# Patient Record
Sex: Female | Born: 1935
Health system: Southern US, Community
[De-identification: ages and names within clinical notes are randomized; demographics above are authoritative.]

## PROBLEM LIST (undated history)

## (undated) DIAGNOSIS — F329 Major depressive disorder, single episode, unspecified: Secondary | ICD-10-CM

## (undated) DIAGNOSIS — M4800 Spinal stenosis, site unspecified: Secondary | ICD-10-CM

## (undated) DIAGNOSIS — I10 Essential (primary) hypertension: Secondary | ICD-10-CM

## (undated) DIAGNOSIS — D126 Benign neoplasm of colon, unspecified: Secondary | ICD-10-CM

## (undated) DIAGNOSIS — E785 Hyperlipidemia, unspecified: Secondary | ICD-10-CM

## (undated) DIAGNOSIS — M1990 Unspecified osteoarthritis, unspecified site: Secondary | ICD-10-CM

## (undated) DIAGNOSIS — E039 Hypothyroidism, unspecified: Secondary | ICD-10-CM

## (undated) DIAGNOSIS — M549 Dorsalgia, unspecified: Secondary | ICD-10-CM

## (undated) DIAGNOSIS — I2510 Atherosclerotic heart disease of native coronary artery without angina pectoris: Secondary | ICD-10-CM

## (undated) DIAGNOSIS — R0989 Other specified symptoms and signs involving the circulatory and respiratory systems: Secondary | ICD-10-CM

## (undated) DIAGNOSIS — K589 Irritable bowel syndrome without diarrhea: Secondary | ICD-10-CM

## (undated) DIAGNOSIS — K760 Fatty (change of) liver, not elsewhere classified: Secondary | ICD-10-CM

## (undated) DIAGNOSIS — K219 Gastro-esophageal reflux disease without esophagitis: Secondary | ICD-10-CM

## (undated) DIAGNOSIS — F32A Depression, unspecified: Secondary | ICD-10-CM

## (undated) DIAGNOSIS — G43909 Migraine, unspecified, not intractable, without status migrainosus: Secondary | ICD-10-CM

## (undated) DIAGNOSIS — G8929 Other chronic pain: Secondary | ICD-10-CM

## (undated) DIAGNOSIS — J45909 Unspecified asthma, uncomplicated: Secondary | ICD-10-CM

## (undated) DIAGNOSIS — K5790 Diverticulosis of intestine, part unspecified, without perforation or abscess without bleeding: Secondary | ICD-10-CM

## (undated) DIAGNOSIS — C439 Malignant melanoma of skin, unspecified: Secondary | ICD-10-CM

## (undated) DIAGNOSIS — K648 Other hemorrhoids: Secondary | ICD-10-CM

## (undated) DIAGNOSIS — I499 Cardiac arrhythmia, unspecified: Secondary | ICD-10-CM

## (undated) HISTORY — PX: KNEE ARTHROSCOPY: SUR90

## (undated) HISTORY — PX: SHOULDER SURGERY: SHX246

## (undated) HISTORY — DX: Diverticulosis of intestine, part unspecified, without perforation or abscess without bleeding: K57.90

## (undated) HISTORY — DX: Malignant melanoma of skin, unspecified: C43.9

## (undated) HISTORY — DX: Other specified symptoms and signs involving the circulatory and respiratory systems: R09.89

## (undated) HISTORY — DX: Other hemorrhoids: K64.8

## (undated) HISTORY — DX: Unspecified osteoarthritis, unspecified site: M19.90

## (undated) HISTORY — DX: Hypothyroidism, unspecified: E03.9

## (undated) HISTORY — DX: Essential (primary) hypertension: I10

## (undated) HISTORY — DX: Atherosclerotic heart disease of native coronary artery without angina pectoris: I25.10

## (undated) HISTORY — DX: Other chronic pain: G89.29

## (undated) HISTORY — PX: ROTATOR CUFF REPAIR: SHX139

## (undated) HISTORY — DX: Dorsalgia, unspecified: M54.9

## (undated) HISTORY — DX: Unspecified asthma, uncomplicated: J45.909

## (undated) HISTORY — DX: Spinal stenosis, site unspecified: M48.00

## (undated) HISTORY — DX: Benign neoplasm of colon, unspecified: D12.6

## (undated) HISTORY — DX: Hyperlipidemia, unspecified: E78.5

## (undated) HISTORY — PX: TOE SURGERY: SHX1073

## (undated) HISTORY — DX: Fatty (change of) liver, not elsewhere classified: K76.0

## (undated) HISTORY — PX: BACK SURGERY: SHX140

## (undated) HISTORY — PX: BLEPHAROPLASTY: SUR158

## (undated) HISTORY — PX: CARPAL TUNNEL RELEASE: SHX101

---

## 2003-12-18 ENCOUNTER — Encounter: Admission: RE | Admit: 2003-12-18 | Discharge: 2003-12-18 | Payer: Self-pay | Admitting: Neurosurgery

## 2004-01-01 ENCOUNTER — Encounter: Admission: RE | Admit: 2004-01-01 | Discharge: 2004-01-01 | Payer: Self-pay | Admitting: Neurosurgery

## 2004-04-15 ENCOUNTER — Encounter: Admission: RE | Admit: 2004-04-15 | Discharge: 2004-04-15 | Payer: Self-pay | Admitting: Obstetrics and Gynecology

## 2004-07-05 ENCOUNTER — Encounter: Admission: RE | Admit: 2004-07-05 | Discharge: 2004-07-05 | Payer: Self-pay | Admitting: Internal Medicine

## 2004-07-21 ENCOUNTER — Encounter: Admission: RE | Admit: 2004-07-21 | Discharge: 2004-07-21 | Payer: Self-pay | Admitting: Internal Medicine

## 2005-01-20 ENCOUNTER — Encounter: Admission: RE | Admit: 2005-01-20 | Discharge: 2005-01-20 | Payer: Self-pay | Admitting: Neurosurgery

## 2005-01-24 ENCOUNTER — Encounter: Admission: RE | Admit: 2005-01-24 | Discharge: 2005-01-24 | Payer: Self-pay | Admitting: Neurosurgery

## 2005-05-18 ENCOUNTER — Encounter: Admission: RE | Admit: 2005-05-18 | Discharge: 2005-05-18 | Payer: Self-pay | Admitting: Obstetrics and Gynecology

## 2005-08-24 ENCOUNTER — Encounter: Admission: RE | Admit: 2005-08-24 | Discharge: 2005-08-24 | Payer: Self-pay | Admitting: Internal Medicine

## 2005-09-22 ENCOUNTER — Emergency Department (HOSPITAL_COMMUNITY): Admission: EM | Admit: 2005-09-22 | Discharge: 2005-09-23 | Payer: Self-pay | Admitting: Emergency Medicine

## 2005-10-03 ENCOUNTER — Encounter: Admission: RE | Admit: 2005-10-03 | Discharge: 2005-10-03 | Payer: Self-pay | Admitting: Gastroenterology

## 2006-02-27 ENCOUNTER — Ambulatory Visit: Payer: Self-pay | Admitting: Cardiology

## 2006-03-15 ENCOUNTER — Ambulatory Visit (HOSPITAL_COMMUNITY): Admission: RE | Admit: 2006-03-15 | Discharge: 2006-03-15 | Payer: Self-pay | Admitting: Cardiology

## 2006-03-15 ENCOUNTER — Ambulatory Visit: Payer: Self-pay | Admitting: Cardiovascular Disease

## 2006-05-07 ENCOUNTER — Ambulatory Visit (HOSPITAL_COMMUNITY): Admission: RE | Admit: 2006-05-07 | Discharge: 2006-05-07 | Payer: Self-pay | Admitting: Internal Medicine

## 2006-05-09 ENCOUNTER — Ambulatory Visit (HOSPITAL_COMMUNITY): Admission: RE | Admit: 2006-05-09 | Discharge: 2006-05-09 | Payer: Self-pay | Admitting: Internal Medicine

## 2006-05-21 ENCOUNTER — Encounter: Admission: RE | Admit: 2006-05-21 | Discharge: 2006-05-21 | Payer: Self-pay | Admitting: Internal Medicine

## 2006-06-05 ENCOUNTER — Encounter: Admission: RE | Admit: 2006-06-05 | Discharge: 2006-06-05 | Payer: Self-pay | Admitting: Neurosurgery

## 2006-07-24 ENCOUNTER — Encounter: Admission: RE | Admit: 2006-07-24 | Discharge: 2006-07-24 | Payer: Self-pay | Admitting: Neurosurgery

## 2006-08-24 ENCOUNTER — Ambulatory Visit (HOSPITAL_COMMUNITY): Admission: RE | Admit: 2006-08-24 | Discharge: 2006-08-24 | Payer: Self-pay | Admitting: Internal Medicine

## 2006-09-14 ENCOUNTER — Ambulatory Visit: Payer: Self-pay | Admitting: Cardiology

## 2006-10-03 ENCOUNTER — Ambulatory Visit: Payer: Self-pay | Admitting: Cardiology

## 2006-10-26 ENCOUNTER — Ambulatory Visit: Payer: Self-pay | Admitting: Cardiology

## 2006-10-26 LAB — CONVERTED CEMR LAB: TSH: 0.03 microintl units/mL — ABNORMAL LOW (ref 0.35–5.50)

## 2006-11-14 ENCOUNTER — Ambulatory Visit: Payer: Self-pay | Admitting: Cardiology

## 2006-11-20 ENCOUNTER — Ambulatory Visit: Payer: Self-pay | Admitting: Cardiology

## 2006-11-22 ENCOUNTER — Ambulatory Visit: Payer: Self-pay

## 2007-01-17 ENCOUNTER — Ambulatory Visit: Payer: Self-pay | Admitting: Cardiology

## 2007-08-13 ENCOUNTER — Encounter: Admission: RE | Admit: 2007-08-13 | Discharge: 2007-08-13 | Payer: Self-pay | Admitting: Internal Medicine

## 2007-11-07 ENCOUNTER — Encounter: Admission: RE | Admit: 2007-11-07 | Discharge: 2007-11-07 | Payer: Self-pay | Admitting: Gastroenterology

## 2008-01-03 ENCOUNTER — Ambulatory Visit: Payer: Self-pay | Admitting: Cardiology

## 2008-01-13 ENCOUNTER — Ambulatory Visit: Payer: Self-pay

## 2008-01-13 ENCOUNTER — Ambulatory Visit: Payer: Self-pay | Admitting: Cardiology

## 2008-01-16 ENCOUNTER — Encounter (INDEPENDENT_AMBULATORY_CARE_PROVIDER_SITE_OTHER): Payer: Self-pay | Admitting: General Surgery

## 2008-01-16 ENCOUNTER — Ambulatory Visit (HOSPITAL_COMMUNITY): Admission: RE | Admit: 2008-01-16 | Discharge: 2008-01-17 | Payer: Self-pay | Admitting: General Surgery

## 2008-01-23 ENCOUNTER — Ambulatory Visit: Payer: Self-pay | Admitting: Oncology

## 2008-03-03 ENCOUNTER — Ambulatory Visit (HOSPITAL_COMMUNITY): Admission: RE | Admit: 2008-03-03 | Discharge: 2008-03-03 | Payer: Self-pay | Admitting: Oncology

## 2008-05-14 ENCOUNTER — Emergency Department (HOSPITAL_COMMUNITY): Admission: EM | Admit: 2008-05-14 | Discharge: 2008-05-14 | Payer: Self-pay | Admitting: Emergency Medicine

## 2008-05-18 ENCOUNTER — Ambulatory Visit (HOSPITAL_COMMUNITY): Admission: RE | Admit: 2008-05-18 | Discharge: 2008-05-18 | Payer: Self-pay | Admitting: Internal Medicine

## 2008-07-16 ENCOUNTER — Ambulatory Visit: Payer: Self-pay

## 2008-08-05 ENCOUNTER — Encounter: Admission: RE | Admit: 2008-08-05 | Discharge: 2008-09-02 | Payer: Self-pay | Admitting: Surgery

## 2008-08-25 ENCOUNTER — Ambulatory Visit: Payer: Self-pay | Admitting: Oncology

## 2008-08-25 LAB — COMPREHENSIVE METABOLIC PANEL
ALT: 18 U/L (ref 0–35)
AST: 19 U/L (ref 0–37)
Albumin: 3.8 g/dL (ref 3.5–5.2)
Alkaline Phosphatase: 98 U/L (ref 39–117)
BUN: 26 mg/dL — ABNORMAL HIGH (ref 6–23)
CO2: 28 mEq/L (ref 19–32)
Calcium: 9.4 mg/dL (ref 8.4–10.5)
Chloride: 106 mEq/L (ref 96–112)
Creatinine, Ser: 0.76 mg/dL (ref 0.40–1.20)
Glucose, Bld: 106 mg/dL — ABNORMAL HIGH (ref 70–99)
Potassium: 4.2 mEq/L (ref 3.5–5.3)
Sodium: 140 mEq/L (ref 135–145)
Total Bilirubin: 0.4 mg/dL (ref 0.3–1.2)
Total Protein: 6.7 g/dL (ref 6.0–8.3)

## 2008-08-25 LAB — CBC WITH DIFFERENTIAL/PLATELET
BASO%: 0.5 % (ref 0.0–2.0)
Basophils Absolute: 0 10*3/uL (ref 0.0–0.1)
EOS%: 2.1 % (ref 0.0–7.0)
Eosinophils Absolute: 0.1 10*3/uL (ref 0.0–0.5)
HCT: 37.5 % (ref 34.8–46.6)
HGB: 12.4 g/dL (ref 11.6–15.9)
LYMPH%: 38.3 % (ref 14.0–49.7)
MCH: 28.5 pg (ref 25.1–34.0)
MCHC: 33.1 g/dL (ref 31.5–36.0)
MCV: 86 fL (ref 79.5–101.0)
MONO#: 0.5 10*3/uL (ref 0.1–0.9)
MONO%: 9.4 % (ref 0.0–14.0)
NEUT#: 2.5 10*3/uL (ref 1.5–6.5)
NEUT%: 49.7 % (ref 38.4–76.8)
Platelets: 177 10*3/uL (ref 145–400)
RBC: 4.36 10*6/uL (ref 3.70–5.45)
RDW: 13.6 % (ref 11.2–14.5)
WBC: 5 10*3/uL (ref 3.9–10.3)
lymph#: 1.9 10*3/uL (ref 0.9–3.3)

## 2008-08-28 ENCOUNTER — Ambulatory Visit (HOSPITAL_COMMUNITY): Admission: RE | Admit: 2008-08-28 | Discharge: 2008-08-28 | Payer: Self-pay | Admitting: Oncology

## 2008-09-14 ENCOUNTER — Encounter (INDEPENDENT_AMBULATORY_CARE_PROVIDER_SITE_OTHER): Payer: Self-pay | Admitting: General Surgery

## 2008-09-14 ENCOUNTER — Inpatient Hospital Stay (HOSPITAL_COMMUNITY): Admission: RE | Admit: 2008-09-14 | Discharge: 2008-09-17 | Payer: Self-pay | Admitting: General Surgery

## 2008-09-30 ENCOUNTER — Encounter: Payer: Self-pay | Admitting: Cardiology

## 2008-09-30 ENCOUNTER — Ambulatory Visit: Payer: Self-pay | Admitting: Oncology

## 2008-09-30 ENCOUNTER — Ambulatory Visit: Admission: RE | Admit: 2008-09-30 | Discharge: 2008-10-27 | Payer: Self-pay | Admitting: Radiation Oncology

## 2008-10-08 ENCOUNTER — Ambulatory Visit (HOSPITAL_COMMUNITY): Admission: RE | Admit: 2008-10-08 | Discharge: 2008-10-08 | Payer: Self-pay | Admitting: Radiation Oncology

## 2008-10-20 ENCOUNTER — Encounter: Payer: Self-pay | Admitting: Cardiology

## 2008-10-31 ENCOUNTER — Encounter: Payer: Self-pay | Admitting: Cardiology

## 2008-11-16 ENCOUNTER — Encounter (INDEPENDENT_AMBULATORY_CARE_PROVIDER_SITE_OTHER): Payer: Self-pay | Admitting: Diagnostic Radiology

## 2008-11-16 ENCOUNTER — Ambulatory Visit (HOSPITAL_COMMUNITY): Admission: RE | Admit: 2008-11-16 | Discharge: 2008-11-16 | Payer: Self-pay | Admitting: General Surgery

## 2008-11-30 ENCOUNTER — Ambulatory Visit: Admission: RE | Admit: 2008-11-30 | Discharge: 2009-01-29 | Payer: Self-pay | Admitting: Radiation Oncology

## 2009-02-11 ENCOUNTER — Ambulatory Visit: Admission: RE | Admit: 2009-02-11 | Discharge: 2009-02-11 | Payer: Self-pay | Admitting: Radiation Oncology

## 2009-02-11 ENCOUNTER — Encounter: Payer: Self-pay | Admitting: Cardiology

## 2009-02-16 ENCOUNTER — Encounter: Admission: RE | Admit: 2009-02-16 | Discharge: 2009-02-16 | Payer: Self-pay | Admitting: Internal Medicine

## 2009-02-22 ENCOUNTER — Ambulatory Visit (HOSPITAL_COMMUNITY): Admission: RE | Admit: 2009-02-22 | Discharge: 2009-02-22 | Payer: Self-pay | Admitting: Internal Medicine

## 2009-03-11 ENCOUNTER — Ambulatory Visit: Payer: Self-pay | Admitting: Oncology

## 2009-03-15 ENCOUNTER — Encounter: Payer: Self-pay | Admitting: Cardiology

## 2009-03-29 ENCOUNTER — Ambulatory Visit (HOSPITAL_COMMUNITY): Admission: RE | Admit: 2009-03-29 | Discharge: 2009-03-29 | Payer: Self-pay | Admitting: Oncology

## 2009-04-06 ENCOUNTER — Encounter: Admission: RE | Admit: 2009-04-06 | Discharge: 2009-04-06 | Payer: Self-pay | Admitting: Orthopedic Surgery

## 2009-04-28 ENCOUNTER — Ambulatory Visit (HOSPITAL_BASED_OUTPATIENT_CLINIC_OR_DEPARTMENT_OTHER): Admission: RE | Admit: 2009-04-28 | Discharge: 2009-04-28 | Payer: Self-pay | Admitting: Orthopedic Surgery

## 2009-05-10 DIAGNOSIS — E039 Hypothyroidism, unspecified: Secondary | ICD-10-CM | POA: Insufficient documentation

## 2009-05-10 DIAGNOSIS — R0989 Other specified symptoms and signs involving the circulatory and respiratory systems: Secondary | ICD-10-CM | POA: Insufficient documentation

## 2009-05-10 DIAGNOSIS — E785 Hyperlipidemia, unspecified: Secondary | ICD-10-CM | POA: Insufficient documentation

## 2009-05-10 DIAGNOSIS — M4800 Spinal stenosis, site unspecified: Secondary | ICD-10-CM | POA: Insufficient documentation

## 2009-05-10 DIAGNOSIS — M1990 Unspecified osteoarthritis, unspecified site: Secondary | ICD-10-CM

## 2009-05-10 DIAGNOSIS — I2510 Atherosclerotic heart disease of native coronary artery without angina pectoris: Secondary | ICD-10-CM | POA: Insufficient documentation

## 2009-05-10 DIAGNOSIS — I10 Essential (primary) hypertension: Secondary | ICD-10-CM | POA: Insufficient documentation

## 2009-05-10 DIAGNOSIS — M1710 Unilateral primary osteoarthritis, unspecified knee: Secondary | ICD-10-CM | POA: Insufficient documentation

## 2009-05-11 ENCOUNTER — Encounter: Payer: Self-pay | Admitting: Cardiology

## 2009-05-12 ENCOUNTER — Ambulatory Visit: Payer: Self-pay | Admitting: Cardiology

## 2009-05-27 ENCOUNTER — Telehealth (INDEPENDENT_AMBULATORY_CARE_PROVIDER_SITE_OTHER): Payer: Self-pay | Admitting: *Deleted

## 2009-06-02 ENCOUNTER — Telehealth: Payer: Self-pay | Admitting: Cardiology

## 2009-07-13 ENCOUNTER — Ambulatory Visit: Payer: Self-pay | Admitting: Oncology

## 2009-07-15 ENCOUNTER — Encounter: Payer: Self-pay | Admitting: Cardiology

## 2009-07-30 ENCOUNTER — Ambulatory Visit: Payer: Self-pay | Admitting: Cardiology

## 2009-07-30 DIAGNOSIS — I493 Ventricular premature depolarization: Secondary | ICD-10-CM | POA: Insufficient documentation

## 2009-08-10 ENCOUNTER — Ambulatory Visit (HOSPITAL_COMMUNITY)
Admission: RE | Admit: 2009-08-10 | Discharge: 2009-08-10 | Payer: Self-pay | Source: Home / Self Care | Admitting: Internal Medicine

## 2009-08-11 ENCOUNTER — Ambulatory Visit (HOSPITAL_BASED_OUTPATIENT_CLINIC_OR_DEPARTMENT_OTHER): Admission: RE | Admit: 2009-08-11 | Discharge: 2009-08-11 | Payer: Self-pay | Admitting: General Surgery

## 2009-08-26 ENCOUNTER — Ambulatory Visit: Payer: Self-pay | Admitting: Oncology

## 2009-08-30 ENCOUNTER — Encounter: Payer: Self-pay | Admitting: Cardiology

## 2009-09-17 ENCOUNTER — Encounter: Admission: RE | Admit: 2009-09-17 | Discharge: 2009-09-17 | Payer: Self-pay | Admitting: Internal Medicine

## 2009-10-01 ENCOUNTER — Ambulatory Visit (HOSPITAL_BASED_OUTPATIENT_CLINIC_OR_DEPARTMENT_OTHER): Admission: RE | Admit: 2009-10-01 | Discharge: 2009-10-01 | Payer: Self-pay | Admitting: General Surgery

## 2009-10-06 ENCOUNTER — Ambulatory Visit (HOSPITAL_COMMUNITY): Admission: RE | Admit: 2009-10-06 | Discharge: 2009-10-06 | Payer: Self-pay | Admitting: Internal Medicine

## 2009-10-27 ENCOUNTER — Ambulatory Visit: Payer: Self-pay | Admitting: Surgery

## 2009-10-27 ENCOUNTER — Ambulatory Visit (HOSPITAL_COMMUNITY): Admission: RE | Admit: 2009-10-27 | Discharge: 2009-10-27 | Payer: Self-pay | Admitting: Internal Medicine

## 2009-11-11 ENCOUNTER — Ambulatory Visit: Payer: Self-pay | Admitting: Oncology

## 2009-11-15 ENCOUNTER — Encounter: Payer: Self-pay | Admitting: Cardiology

## 2010-02-18 ENCOUNTER — Ambulatory Visit: Payer: Self-pay | Admitting: Oncology

## 2010-02-22 ENCOUNTER — Encounter: Payer: Self-pay | Admitting: Cardiology

## 2010-02-23 ENCOUNTER — Encounter: Admission: RE | Admit: 2010-02-23 | Discharge: 2010-02-23 | Payer: Self-pay | Admitting: Internal Medicine

## 2010-04-20 ENCOUNTER — Ambulatory Visit (HOSPITAL_BASED_OUTPATIENT_CLINIC_OR_DEPARTMENT_OTHER): Admission: RE | Admit: 2010-04-20 | Discharge: 2010-04-20 | Payer: Self-pay | Admitting: General Surgery

## 2010-06-12 ENCOUNTER — Encounter: Payer: Self-pay | Admitting: Internal Medicine

## 2010-06-22 NOTE — Letter (Signed)
[facility]   [facility]   Imported By: Roderic Ovens 04/25/2010 10:56:04  _____________________________________________________________________  External Attachment:    Type:   Image     Comment:   External Document

## 2010-06-22 NOTE — Letter (Signed)
Summary: [facility]  [facility]   Imported By: Kassie Mends 11/18/2008 07:57:17  _____________________________________________________________________  External Attachment:    Type:   Image     Comment:   External Document

## 2010-06-22 NOTE — Assessment & Plan Note (Signed)
Summary: [name]/ GD   Visit Type:  Follow-up Primary Provider:  Lovenia Kim, DO  CC:  HTN.  History of Present Illness: The patient presents for followup of her difficult to control hypertension. She also has some nonobstructive coronary disease. Since I last saw her she has had no new cardiovascular complaints. She did have arthroscopic right knee surgery and is recovering from this. She denies any new symptoms such as chest pressure, neck or arm discomfort. She is not having any palpitations, presyncope or syncope. She is having no PND or orthopnea. She says her blood pressure will occasionally go up in the evenings but for the most part is well controlled in the 130 systolic range and less than 80 diastolic.  Current Medications (verified): 1)  Crestor 10 Mg Tabs (Rosuvastatin Calcium) .Kerri [name].. 1 By Mouth Daily 2)  Doxycycline Hyclate 100 Mg Caps (Doxycycline Hyclate) .Kerri [name].. 1 By Mouth Daily 3)  Aspirin 81 Mg  Tabs (Aspirin) .Kerri [name].. 1 By Mouth Daily 4)  Vitamin D 2000 Unit Tabs (Cholecalciferol) .Kerri [name].. 1 By Mouth Two Times A Day 5)  Levothroid 100 Mcg Tabs (Levothyroxine Sodium) .Kerri [name].. 1 By Mouth Daily 6)  Mag-Ox 400 400 Mg Tabs (Magnesium Oxide) .Kerri [name].. 1 By Mouth Daily 7)  Nexium 40 Mg Cpdr (Esomeprazole Magnesium) .Kerri [name].. 1 By Mouth Daily 8)  Naproxen Sodium 220 Mg Tabs (Naproxen Sodium) .Kerri [name].. 1 By Mouth Two Times A Day  Allergies (verified): 1)  ! * All Pain Meds  Past History:  Past Medical History: CAD (ICD-414.00) DYSLIPIDEMIA (ICD-272.4) CAROTID BRUIT (ICD-785.9) HYPERTENSION (ICD-401.9) DEGENERATIVE JOINT DISEASE (ICD-715.90) SPINAL STENOSIS (ICD-724.00) HYPOTHYROIDISM (ICD-244.9) Back pain chronic   Past Surgical History:  Right knee arthroscopy with meniscal debridement and   Chondroplasty.   Shoulder surgery  Carpal tunnel surgery.   Great toe operation.   Right   Review of Systems       As stated in the HPI and negative for all other systems.   Vital Signs:  Patient  profile:   76 year old female Height:      65 inches Weight:      139 pounds BMI:     23.21 Pulse rate:   78 / minute Resp:     16 per minute BP sitting:   116 / 74  (right arm)  Vitals Entered By: Marrion Coy, CNA (May 12, 2009 3:19 PM)  Physical Exam  General:  Well developed, well nourished, in no acute distress. Head:  normocephalic and atraumatic Eyes:  PERRLA/EOM intact; conjunctiva and lids normal. Mouth:  Teeth, gums and palate normal. Oral mucosa normal. Neck:  Neck supple, no JVD. No masses, thyromegaly or abnormal cervical nodes. Chest Wall:  no deformities or breast masses noted Lungs:  Clear bilaterally to auscultation and percussion. Heart:  Non-displaced PMI, chest non-tender; regular rate and rhythm, S1, S2 without murmurs, rubs or gallops. Carotid upstroke normal, no bruit. Normal abdominal aortic size, no bruits. Femorals normal pulses, no bruits. Pedals normal pulses. No edema, no varicosities. Abdomen:  Bowel sounds positive; abdomen soft and non-tender without masses, organomegaly, or hernias noted. No hepatosplenomegaly. Msk:  Back normal, normal gait. Muscle strength and tone normal. Extremities:  No clubbing or cyanosis. Neurologic:  Alert and oriented x 3. Skin:  Intact without lesions or rashes. Psych:  tearful otherwise unremarkable   EKG  Procedure date:  05/12/2009  Findings:      sinus rhythm with premature atrial contractions, rate 78, axis within normal limits, intervals within normal limits, no  acute ST-T wave changes.  Impression & Recommendations:  Problem # 1:  HYPERTENSION (ICD-401.9)  Her blood pressure is well controlled. All make no change at this point. No further testing is suggested.  Her updated medication list for this problem includes:    Aspirin 81 Mg Tabs (Aspirin) .Kerri [name]... 1 by mouth daily  Problem # 2:  DYSLIPIDEMIA (ICD-272.4) This he is followed by her primary care doctor. She had her labs drawn a couple days ago and  we'll get the results. A goal LDL less than 100 with an HDL greater than 40 would be an aggressive approach but perhaps a reasonable in this patient with nonobstructive coronary disease. Her updated medication list for this problem includes:    Crestor 10 Mg Tabs (Rosuvastatin calcium) .Kerri [name]... 1 by mouth daily  Problem # 3:  CAD (ICD-414.00)  She has nonobstructive disease and will be followed with risk reduction. No testing is needed.  Orders: EKG w/ Interpretation (93000)  Patient Instructions: 1)  Your physician recommends that you schedule a follow-up appointment in: 6 months with Dr Antoine Poche 2)  Your physician recommends that you continue on your current medications as directed. Please refer to the Current Medication list given to you today.

## 2010-06-22 NOTE — Letter (Signed)
[facility]   Imported By: Roderic Ovens 08/11/2009 11:06:40  _____________________________________________________________________  External Attachment:    Type:   Image     Comment:   External Document

## 2010-06-22 NOTE — Letter (Signed)
Summary: [facility]  [facility]   Imported By: Roderic Ovens 03/11/2009 13:31:41  _____________________________________________________________________  External Attachment:    Type:   Image     Comment:   External Document

## 2010-06-22 NOTE — Letter (Signed)
Summary: [facility]  [facility]   Imported By: Roderic Ovens 10/31/2008 10:20:13  _____________________________________________________________________  External Attachment:    Type:   Image     Comment:   External Document

## 2010-06-22 NOTE — Letter (Signed)
Summary: [facility]  [facility]   Imported By: Roderic Ovens 10/31/2008 09:54:47  _____________________________________________________________________  External Attachment:    Type:   Image     Comment:   External Document

## 2010-06-22 NOTE — Letter (Signed)
Summary: [facility]  [facility]   Imported By: Roderic Ovens 04/07/2009 12:07:33  _____________________________________________________________________  External Attachment:    Type:   Image     Comment:   External Document

## 2010-06-22 NOTE — Letter (Signed)
[facility]  [facility]   Imported By: Kassie Mends 10/07/2009 09:31:42  _____________________________________________________________________  External Attachment:    Type:   Image     Comment:   External Document

## 2010-06-22 NOTE — Progress Notes (Signed)
Summary: WANTS HER AND CRAIG'S BP TREATED BUT DOESNT WANT APPT  Phone Note Call from Patient Call back at Home Phone [phone]   Caller: Patient Summary of Call: PT CALLING ABOUT B/P READINGS WANT A CALL BACK ABOUT THE READING THAT WAS LEFT ABOUT A WEEK AGO Initial call taken by: Judie Grieve,  June 02, 2009 11:07 AM  Follow-up for Phone Call        spoke with pt about her and her son's BP she is concerned that BP's are too high.  She is scared that they need to be treated.  Her BP are running 130/70-80 and her son's Tasia Catchings) are sometimes are as high as 150/80's.  Instructed pt that these are not critical levels for BP.  She has left readings on her and her son for Dr Katrinka Herbison's review.  She is aware Dr Antoine Poche is working in the hospital and will review when he is back in office. Follow-up by: Charolotte Capuchin, RN,  June 02, 2009 12:18 PM  Additional Follow-up for Phone Call Additional follow up Details #1::        Her blood pressures seem to be OK.  I think the Tasia Catchings that she refers to is her son.  I will review BP diary. Dr Rollene Rotunda      Additional Follow-up for Phone Call Additional follow up Details #2::    I reveiwed the results for both her and her son.  There are some elevated readings.  If there is no follow up shedule both of them on the same day if she would like and I will address these. Follow-up by: Rollene Rotunda, MD, [facility],  June 17, 2009 1:11 PM  Additional Follow-up for Phone Call Additional follow up Details #3:: Details for Additional Follow-up Action Taken: SPOKE WITH PT - SHE FEELS AS THOUGH HER BP SHOULD BE TREATED BUT DOESN'T WANT TO COME IN FOR AN OFFICE VISIT - STATES "I WAS JUST THERE AND SHOULDNT HAVE TO COME BACK IN, NOTHING HAS CHANGED"  PT ALSO STATES THAT SEE HAS BEEN TOLD BEFORE BY "OTHER DOCTORS" THAT SHE HAS AN IRREGULAR HB AND FEELS LIKE THAT SHOULD BE TREATED AS WELL.  I EXPLAINED TO PT WE WOULD NEED THAT DOCUMENTATION BEFORE  ANY TREATMENT COULD BE ESTABLISHED AS HER EKG HERE IS NSR.  SHE DOES NOT FEEL HER HEART BEAT IRREGULARLY.  SHE HEARD ON TV THAT PEOPLE WITH AN IRREGULAR HEART BEAT ARE AT MORE RISK OF STOKE AND WANTS DR Faten Frieson TO TELL HER  THAT SHE DOESN'T HAVE AN ABNORMAL HEARTBEAT. AGAIN SHE DOES NOT WANT TO COME INTO THE OFFICE TO BE SEEN OR TO DISCUSS.  PT REPORTS THAT FREQUENTLY CRAIG'S BP IS ELEVATED ABOVE 150 AND THAT NEEDS TO BE TREATED AS WELL.  SHE TAKES HIS BP TWICE A DAY BECAUSE SHE FEELS LIKE SHE "NEEDS TO KNOW WHAT THE HELL IS GOING ON". SHE DOES NOT WANT TO COME IN OR BRING CRAIG IN FOR AN APPOINTMENT.  I TOLD HER I WOULD ASK DR [facility] AND ONE OF [place] WOULD CALL HER BACK WITH RECCOMENDATIONS.  PAM FLEMING-HAYES,RN   Appended Document: WANTS HER AND CRAIG'S BP TREATED BUT DOESNT WANT APPT I spoke with the patient 1/27 at 7:30 pm.  I told her that both her and her son's blood pressures were mildly elevated and that we could discuss treatment and her palpitations at an appt but that it was not my practice to begin blood pressure treatment over the phone.  She agrees to  call and understands that I will be out oof the office for vacation.  We can see both her and her son when I return or she can follow with her primary MD.  The blood pressure readings for both patients was intermitently mildy elevated.

## 2010-06-22 NOTE — Letter (Signed)
[facility]   Imported By: Roderic Ovens 12/14/2009 10:57:02  _____________________________________________________________________  External Attachment:    Type:   Image     Comment:   External Document

## 2010-06-22 NOTE — Progress Notes (Signed)
  Walk in Patient Form Recieved " BP REadings" forwarded to [facility]  May 27, 2009 3:17 PM

## 2010-06-22 NOTE — Assessment & Plan Note (Signed)
Summary: 401.1 6 months rov  pfh,rn   Visit Type:  Follow-up Primary Provider:  Lovenia Kim, DO  CC:  HTN.  History of Present Illness: The patient presents for followup of her hypertension. She's been keeping a blood pressure diary. Actually her blood pressures have been quite normal. She's had no recent cardiovascular symptoms. She has not had any chest pain or shortness of breath. She said the palpitations, presyncope or syncope. She apparently is to have resection of a melanoma from her right thigh soon.  Current Medications (verified): 1)  Crestor 10 Mg Tabs (Rosuvastatin Calcium) .Marland Kitchen.. 1 By Mouth Daily 2)  Doxycycline Hyclate 100 Mg Caps (Doxycycline Hyclate) .Marland Kitchen.. 1 By Mouth Two Times A Day 3)  Aspirin 81 Mg  Tabs (Aspirin) .Marland Kitchen.. 1 By Mouth Daily 4)  Vitamin D 2000 Unit Tabs (Cholecalciferol) .Marland Kitchen.. 1 By Mouth Two Times A Day 5)  Levothroid 100 Mcg Tabs (Levothyroxine Sodium) .Marland Kitchen.. 1 By Mouth Daily 6)  Mag-Ox 400 400 Mg Tabs (Magnesium Oxide) .Marland Kitchen.. 1 By Mouth Daily 7)  Nexium 40 Mg Cpdr (Esomeprazole Magnesium) .Marland Kitchen.. 1 By Mouth Daily 8)  Naproxen Sodium 220 Mg Tabs (Naproxen Sodium) .Marland Kitchen.. 1 By Mouth Two Times A Day  Allergies (verified): 1)  ! * All Pain Meds  Past History:  Past Medical History: CAD (ICD-414.00) (40% LAD stenosis by CT) DYSLIPIDEMIA (ICD-272.4) CAROTID BRUIT (ICD-785.9) HYPERTENSION (ICD-401.9) DEGENERATIVE JOINT DISEASE (ICD-715.90) SPINAL STENOSIS (ICD-724.00) HYPOTHYROIDISM (ICD-244.9) Back pain chronic  Melanoma  Past Surgical History: Reviewed history from 05/12/2009 and no changes required.  Right knee arthroscopy with meniscal debridement and   Chondroplasty.   Shoulder surgery  Carpal tunnel surgery.   Great toe operation.   Right   Review of Systems       As stated in the HPI and negative for all other systems.   Vital Signs:  Patient profile:   77 year old female Height:      65 inches Weight:      148 pounds BMI:     24.72 Pulse  rate:   74 / minute Resp:     16 per minute BP sitting:   143 / 85  (left arm)  Vitals Entered By: Marrion Coy, CNA (July 30, 2009 12:33 PM)  Physical Exam  General:  Well developed, well nourished, in no acute distress. Head:  normocephalic and atraumatic Neck:  Neck supple, no JVD. No masses, thyromegaly or abnormal cervical nodes. Chest Wall:  no deformities or breast masses noted Lungs:  Clear bilaterally to auscultation and percussion. Heart:  Non-displaced PMI, chest non-tender; regular rate and rhythm, S1, S2 without murmurs, rubs or gallops. Carotid upstroke normal, no bruit. Normal abdominal aortic size, no bruits. Femorals normal pulses, no bruits. Pedals normal pulses. No edema, no varicosities. Abdomen:  Bowel sounds positive; abdomen soft and non-tender without masses, organomegaly, or hernias noted. No hepatosplenomegaly. Msk:  Back normal, normal gait. Muscle strength and tone normal. Neurologic:  Alert and oriented x 3.   EKG  Procedure date:  07/30/2009  Findings:      sinus rhythm, rate 74, premature ectopic complexes, axis within normal limits, intervals within normal limits, no acute ST-T wave changes.  Impression & Recommendations:  Problem # 1:  HYPERTENSION (ICD-401.9) The blood pressure is very well controlled. At this point no change in therapy is indicated. She can keep benign on her blood pressure but doesn't need to be as vigilant.  Problem # 2:  OTHER PREMATURE BEATS (ICD-427.69) The  patient has premature ectopic complexes noted as described. However, these are not particularly symptomatic. She doesn't notice them and doesn't have syncope or presyncope. Therefore, no further evaluation is necessary.  Problem # 3:  CAD (ICD-414.00) The patient had nonobstructive CAD and will continue with primary risk reduction.

## 2010-06-29 ENCOUNTER — Other Ambulatory Visit: Payer: Self-pay | Admitting: General Surgery

## 2010-08-02 LAB — POCT HEMOGLOBIN-HEMACUE: Hemoglobin: 13 g/dL (ref 12.0–15.0)

## 2010-08-04 ENCOUNTER — Encounter (HOSPITAL_BASED_OUTPATIENT_CLINIC_OR_DEPARTMENT_OTHER): Payer: Medicare Other | Admitting: Oncology

## 2010-08-04 DIAGNOSIS — I890 Lymphedema, not elsewhere classified: Secondary | ICD-10-CM

## 2010-08-04 DIAGNOSIS — C4370 Malignant melanoma of unspecified lower limb, including hip: Secondary | ICD-10-CM

## 2010-08-09 LAB — POCT HEMOGLOBIN-HEMACUE: Hemoglobin: 11.5 g/dL — ABNORMAL LOW (ref 12.0–15.0)

## 2010-08-14 LAB — POCT HEMOGLOBIN-HEMACUE: Hemoglobin: 12 g/dL (ref 12.0–15.0)

## 2010-08-23 LAB — POCT I-STAT 4, (NA,K, GLUC, HGB,HCT)
Glucose, Bld: 106 mg/dL — ABNORMAL HIGH (ref 70–99)
HCT: 39 % (ref 36.0–46.0)
Hemoglobin: 13.3 g/dL (ref 12.0–15.0)
Potassium: 4 mEq/L (ref 3.5–5.1)
Sodium: 141 mEq/L (ref 135–145)

## 2010-08-24 LAB — GLUCOSE, CAPILLARY: Glucose-Capillary: 102 mg/dL — ABNORMAL HIGH (ref 70–99)

## 2010-08-27 ENCOUNTER — Emergency Department (HOSPITAL_COMMUNITY): Payer: Medicare Other

## 2010-08-27 ENCOUNTER — Emergency Department (HOSPITAL_COMMUNITY)
Admission: EM | Admit: 2010-08-27 | Discharge: 2010-08-27 | Disposition: A | Discharge status: Home / Self Care | Payer: Medicare Other | Attending: Emergency Medicine | Admitting: Emergency Medicine

## 2010-08-27 DIAGNOSIS — R51 Headache: Secondary | ICD-10-CM | POA: Insufficient documentation

## 2010-08-27 DIAGNOSIS — E039 Hypothyroidism, unspecified: Secondary | ICD-10-CM | POA: Insufficient documentation

## 2010-08-27 DIAGNOSIS — E7800 Pure hypercholesterolemia, unspecified: Secondary | ICD-10-CM | POA: Insufficient documentation

## 2010-08-27 DIAGNOSIS — I10 Essential (primary) hypertension: Secondary | ICD-10-CM | POA: Insufficient documentation

## 2010-08-27 DIAGNOSIS — Z85820 Personal history of malignant melanoma of skin: Secondary | ICD-10-CM | POA: Insufficient documentation

## 2010-08-27 LAB — POCT I-STAT, CHEM 8
BUN: 22 mg/dL (ref 6–23)
Calcium, Ion: 1.16 mmol/L (ref 1.12–1.32)
Chloride: 106 mEq/L (ref 96–112)
Creatinine, Ser: 0.8 mg/dL (ref 0.4–1.2)
Glucose, Bld: 103 mg/dL — ABNORMAL HIGH (ref 70–99)
HCT: 39 % (ref 36.0–46.0)
Hemoglobin: 13.3 g/dL (ref 12.0–15.0)
Potassium: 4.1 mEq/L (ref 3.5–5.1)
Sodium: 140 mEq/L (ref 135–145)
TCO2: 27 mmol/L (ref 0–100)

## 2010-08-27 LAB — POCT CARDIAC MARKERS
CKMB, poc: 1.8 ng/mL (ref 1.0–8.0)
Myoglobin, poc: 66.9 ng/mL (ref 12–200)
Troponin i, poc: <0.05 ng/mL (ref 0.00–0.09)

## 2010-08-27 LAB — CBC
HCT: 38.9 % (ref 36.0–46.0)
Hemoglobin: 12.4 g/dL (ref 12.0–15.0)
MCH: 26.8 pg (ref 26.0–34.0)
MCHC: 31.9 g/dL (ref 30.0–36.0)
MCV: 84.2 fL (ref 78.0–100.0)
Platelets: 177 10*3/uL (ref 150–400)
RBC: 4.62 MIL/uL (ref 3.87–5.11)
RDW: 15.3 % (ref 11.5–15.5)
WBC: 5.6 10*3/uL (ref 4.0–10.5)

## 2010-08-29 LAB — CBC
HCT: 35.9 % — ABNORMAL LOW (ref 36.0–46.0)
Hemoglobin: 11.7 g/dL — ABNORMAL LOW (ref 12.0–15.0)
MCHC: 32.6 g/dL (ref 30.0–36.0)
MCV: 79.7 fL (ref 78.0–100.0)
Platelets: 177 10*3/uL (ref 150–400)
RBC: 4.5 MIL/uL (ref 3.87–5.11)
RDW: 15.6 % — ABNORMAL HIGH (ref 11.5–15.5)
WBC: 5 10*3/uL (ref 4.0–10.5)

## 2010-08-29 LAB — PROTIME-INR
INR: 1 (ref 0.00–1.49)
Prothrombin Time: 13 seconds (ref 11.6–15.2)

## 2010-08-31 ENCOUNTER — Encounter: Payer: Self-pay | Admitting: Physician Assistant

## 2010-08-31 ENCOUNTER — Ambulatory Visit (INDEPENDENT_AMBULATORY_CARE_PROVIDER_SITE_OTHER): Payer: Medicare Other | Admitting: Physician Assistant

## 2010-08-31 VITALS — BP 140/79 | HR 70 | Ht 65.75 in | Wt 145.0 lb

## 2010-08-31 DIAGNOSIS — I2510 Atherosclerotic heart disease of native coronary artery without angina pectoris: Secondary | ICD-10-CM

## 2010-08-31 DIAGNOSIS — I10 Essential (primary) hypertension: Secondary | ICD-10-CM

## 2010-08-31 LAB — CBC
HCT: 39.2 % (ref 36.0–46.0)
Hemoglobin: 13 g/dL (ref 12.0–15.0)
MCHC: 33 g/dL (ref 30.0–36.0)
MCV: 85.7 fL (ref 78.0–100.0)
Platelets: 197 10*3/uL (ref 150–400)
RBC: 4.57 MIL/uL (ref 3.87–5.11)
RDW: 14 % (ref 11.5–15.5)
WBC: 5.2 10*3/uL (ref 4.0–10.5)

## 2010-08-31 LAB — BASIC METABOLIC PANEL
BUN: 18 mg/dL (ref 6–23)
CO2: 27 mEq/L (ref 19–32)
Calcium: 9.8 mg/dL (ref 8.4–10.5)
Chloride: 107 mEq/L (ref 96–112)
Creatinine, Ser: 0.79 mg/dL (ref 0.4–1.2)
GFR calc Af Amer: >60 mL/min (ref >60)
GFR calc non Af Amer: >60 mL/min (ref >60)
Glucose, Bld: 99 mg/dL (ref 70–99)
Potassium: 4.6 mEq/L (ref 3.5–5.1)
Sodium: 141 mEq/L (ref 135–145)

## 2010-08-31 LAB — GLUCOSE, CAPILLARY: Glucose-Capillary: 105 mg/dL — ABNORMAL HIGH (ref 70–99)

## 2010-08-31 MED ORDER — OLMESARTAN MEDOXOMIL-HCTZ 20-12.5 MG PO TABS: 1.0000 | ORAL_TABLET | Freq: Every day | ORAL | Status: DC

## 2010-08-31 NOTE — Assessment & Plan Note (Signed)
She had some left arm pain with her episode of elevated BP that sent her to the ED.  This was atypical.  However, she has evidence of mild nonobs CAD on CT in 2007 and negative ETT in 2009.  I suggest she repeat an ETT.  She would like to have this with Dr. Antoine Poche.  She will be brought back to follow up with him thereafter.

## 2010-08-31 NOTE — Assessment & Plan Note (Signed)
She admits to an 8 pound weight gain in the last few weeks.  She also has a FHx of HTN and she takes Naproxen bid every day.  She likely has essential HTN that has been exacerbated by the weight gain and NSAIDs.  We discussed the dangers of taking NSAIDs chronically, but she is unable to function without them.  I have recommended she change to Benicar/HCT 20/12.5 mg qd.  She will have a BP check and BMET in one week.  She should bring her cuff for comparison.  She had labs done recently with her PCP and we will try to obtain them.

## 2010-08-31 NOTE — Patient Instructions (Addendum)
Your physician recommends that you schedule a follow-up appointment in: 3-4 WEEKS WITH DR. HOCHREIN AS PER SCOTT WEAVER, PA-C  Your physician has recommended you make the following change in your medication: STOP TAKING BENICAR 20 MG AND START TAKING BENICAR/HCT 20/12.5 MG 1 TABLET ONCE DAILY.  Your physician recommends that you return for lab work in: 1 WEEK BMET 401.9, 414.00; DURING THIS VISIT WE NEED YOU TO HAVE A NURSE VISIT AS WELL FOR A BLOOD PRESSURE CHECK, PLEASE BRING YOUR BLOOD PRESSURE MACHINE FROM HOME SO THAT WE MAY COMPARE READINGS.  Your physician has requested that you have an exercise tolerance test 401.9, 414.00 PLEASE SCHEDULE THIS WITH DR. HOCHREIN AS PER PATIENT AND SCOTT WEAVER, PA-C. For further information please visit [email or web address] Please also follow instruction sheet, as given.

## 2010-08-31 NOTE — Progress Notes (Signed)
History of Present Illness: Primary Cardiologist:  Dr. Rollene Rotunda  Kerri [name] is a 77 y.o. female presents as an add on today for blood pressure.  She was apparently referred by her PCP 1-2 weeks ago.  She preferred to see Dr. Antoine Poche only and was scheduled in May.  But, she called today and wanted to be seen.    She noted increased BPs over the last 2 weeks.  She has recorded systolic readings of 112-150.  She had some left arm pain on 4/7 and she went to the ED. She had some old Benicar that had expired.  She had been taking this without change.  She was noted to have a BP of 181/97 when she presented.  She was given IV Enalapril.  BP was 160/103 at discharge.  CE's were normal and istat was unremarkable.  She was having a headache that she thinks was more related to allergies and her PCP recently gave her a "steroid injection."  She feels better in this regard.  She notes that she remains quite active despite her chronic back pain.  She works in her garden and does house work.  She denies exertional chest pain or dyspnea.  No orthopnea or PND.  No syncope.  No edema.  She has continued to note BPs of 140-150 even on the Benicar 20 mg.  She saw her PCP who recommended she increase this to 40 mg and follow up here.  Past Medical History  Diagnosis Date  . CAD (coronary artery disease)     Chest CTA 10/07: 40% or less pLAD;  ETT 8/09: negative  . Dyslipidemia   . HTN (hypertension)   . DJD (degenerative joint disease)   . Hypothyroidism   . Melanoma     metastatic; left leg; s/p multiple excisions    Current Outpatient Prescriptions  Medication Sig Dispense Refill  . aspirin 81 MG tablet Take 81 mg by mouth daily.        . Cholecalciferol (VITAMIN D) 2000 UNITS CAPS Take 1 capsule by mouth 2 (two) times daily.        Marland Kitchen doxycycline (VIBRAMYCIN) 100 MG capsule Take 100 mg by mouth 2 (two) times daily.        Marland Kitchen esomeprazole (NEXIUM) 40 MG capsule Take 40 mg by mouth daily before  breakfast.        . levothyroxine (SYNTHROID, LEVOTHROID) 88 MCG tablet Take 88 mcg by mouth daily.        Marland Kitchen loratadine (CLARITIN) 10 MG tablet Take 10 mg by mouth daily.        . magnesium oxide (MAG-OX) 400 MG tablet Take 400 mg by mouth daily.        . naproxen sodium (ANAPROX) 220 MG tablet Take 220 mg by mouth 2 (two) times daily with a meal.        . rosuvastatin (CRESTOR) 10 MG tablet Take 10 mg by mouth daily.        Marland Kitchen DISCONTD: olmesartan (BENICAR) 20 MG tablet Take 20 mg by mouth daily. Pt will go up to 40 mg daily starting 09/01/2010       . olmesartan-hydrochlorothiazide (BENICAR HCT) 20-12.5 MG per tablet Take 1 tablet by mouth daily.  30 tablet  11   Allergies:  All pain meds. Not on File  Vital Signs: BP 140/79  Pulse 70  Ht 5' 5.75" (1.67 m)  Wt 145 lb (65.772 kg)  BMI 23.58 kg/m2  PHYSICAL EXAM: Well nourished, well  developed, in no acute distress HEENT: normal Neck: no JVD Vascular: No carotid bruits Cardiac:  normal S1, S2; RRR; no murmur Lungs:  clear to auscultation bilaterally, no wheezing, rhonchi or rales Abd: soft, nontender, no hepatomegaly Ext: no edema Skin: warm and dry Neuro:  CNs 2-12 intact, no focal abnormalities noted  EKG:  From April 7 done in the emergency room, Normal sinus rhythm, heart rate 73, PACs, nonspecific ST-T wave changes, no significant change and compared to prior tracing.  ASSESSMENT AND PLAN:

## 2010-09-07 ENCOUNTER — Encounter: Payer: Self-pay | Admitting: *Deleted

## 2010-09-07 ENCOUNTER — Other Ambulatory Visit (INDEPENDENT_AMBULATORY_CARE_PROVIDER_SITE_OTHER): Payer: Medicare Other | Admitting: *Deleted

## 2010-09-07 ENCOUNTER — Ambulatory Visit (INDEPENDENT_AMBULATORY_CARE_PROVIDER_SITE_OTHER): Payer: Medicare Other | Admitting: Cardiology

## 2010-09-07 VITALS — BP 104/63 | HR 68 | Resp 16

## 2010-09-07 DIAGNOSIS — I10 Essential (primary) hypertension: Secondary | ICD-10-CM

## 2010-09-07 LAB — BASIC METABOLIC PANEL
BUN: 32 mg/dL — ABNORMAL HIGH (ref 6–23)
CO2: 28 mEq/L (ref 19–32)
Calcium: 9.3 mg/dL (ref 8.4–10.5)
Chloride: 105 mEq/L (ref 96–112)
Creatinine, Ser: 0.8 mg/dL (ref 0.4–1.2)
GFR: 74.64 mL/min (ref >60.00)
Glucose, Bld: 85 mg/dL (ref 70–99)
Potassium: 4.4 mEq/L (ref 3.5–5.1)
Sodium: 141 mEq/L (ref 135–145)

## 2010-09-07 NOTE — Telephone Encounter (Signed)
This encounter was created in error - please disregard.

## 2010-09-07 NOTE — Progress Notes (Signed)
Pt here for BP check--see vital signs above--states is now taking Benicar/HCTZ  20/12.5 --she brought her BP cuff and when taking BP discovered her cuff runs about 10mm higher then our cuffs--advised pt to take BP at same time ,place, and only once per day--and expect her  cuff to read 10mm higher than ours--pt agrees and will continue to keep BP records--nt

## 2010-09-08 ENCOUNTER — Ambulatory Visit: Payer: Medicare Other | Admitting: Physician Assistant

## 2010-09-27 ENCOUNTER — Encounter: Payer: Self-pay | Admitting: Cardiology

## 2010-09-28 ENCOUNTER — Encounter: Payer: Medicare Other | Admitting: Cardiology

## 2010-10-04 ENCOUNTER — Other Ambulatory Visit: Payer: Self-pay | Admitting: Internal Medicine

## 2010-10-04 DIAGNOSIS — R1033 Periumbilical pain: Secondary | ICD-10-CM

## 2010-10-04 DIAGNOSIS — R102 Pelvic and perineal pain: Secondary | ICD-10-CM

## 2010-10-04 NOTE — Assessment & Plan Note (Signed)
[facility]                            CARDIOLOGY OFFICE NOTE   NAME:Kerri [name], Kerri [name]                         MRN:          [id number]  DATE:10/26/2006                            DOB:          1929/07/19    PRIMARY CARE PHYSICIAN:  Lovenia Kim, D.O.   REASON FOR VISIT:  Evaluate patient with hypotension and fluctuating  blood pressures.   HISTORY OF PRESENT ILLNESS:  The patient is 77 years old. I have seen  her for evaluation of a slightly abnormal Cardiolite. She has  nonobstructive coronary disease. She has also had difficult to control  hypertension. She came in to see the PA a couple of weeks ago because of  dizziness and blood pressures dropping in the 90s. She says this happens  in the mornings. She finishes taking care of her son who is disabled.  She may sit down and she feels profound fatigue. She has had her blood  pressure checked and it has been sometimes in the 90s. She does not lose  consciousness. She does not feel any palpitations. She does not have any  chest pain or shortness of breath. She is not describing orthostatic  symptoms. She did have a 24 hour urine for catecholamines and  metanephrines. It was essentially unremarkable. She has not had any  chest discomfort, neck or arm discomfort. She has not had any shortness  of breath, PND or orthopnea.   PAST MEDICAL HISTORY:  Dyslipidemia, spinal stenosis, nonobstructive  coronary disease (40% LAD stenosis on CAT scan), degenerative joint  disease, shoulder surgery, knee surgery, carpal tunnel surgery, great  toe operation.   ALLERGIES:  CODEINE and MORPHINE.   MEDICATIONS:  1. Fosamax 70 mg weekly.  2. Lipitor 80 mg daily.  3. Thyroxine 100 mg daily.  4. Nexium 40 mg daily.  5. Aleve.  6. Claritin.  7. Zanaflex 4 mg b.i.d.  8. Aspirin 81 mg daily.  9. Doxicycline 200 mg daily.   REVIEW OF SYSTEMS:  As stated in the HPI and otherwise negative for  other systems.   PHYSICAL EXAMINATION:  GENERAL:  The patient is in no distress.  VITAL SIGNS:  Blood pressure 131/82, heart rate 88 and regular.  HEENT:  Eyelids unremarkable. Pupils equal round and reactive to light.  Fundi not visualized. Oral mucosa unremarkable.  NECK:  No jugular venous distention, wave form within normal limits.  Carotid upstroke brisk and symmetric, no bruits, no thyromegaly.  LYMPHATICS:  No cervical, axillary or inguinal adenopathy.  LUNGS:  Clear to auscultation bilaterally.  BACK:  No costovertebral angle tenderness.  CHEST:  Unremarkable.  HEART:  PMI not displaced or sustained, S1 and S2 within normal limits,  no S3, no S4, no clicks, no rubs, no murmurs.  ABDOMEN:  Flat, positive bowel sounds, normal to frequency and pitch, no  bruits, no rebound, no guarding, no midline pulsatile mass, no  organomegaly.  SKIN:  No rashes, no nodules.  EXTREMITIES:  2+ pulses, no cyanosis or clubbing, no edema.  NEUROLOGIC:  Oriented to person, placed and time. Cranial nerves II-XII  grossly intact. Motor grossly intact throughout.   EKG:  Sinus rhythm with premature ectopic complexes, axis within normal  limits, interval is within normal limits, no acute ST wave changes.   ASSESSMENT/PLAN:  1. Hypotension. The patient's hypotensive episodes do not have a clear      etiology. She did not have any orthostatic changes on her blood      pressure today. There is a possibility this is related to the      Zanaflex. We may need to discontinue this drug. However, I am first      going to apply a two week event monitor to see if there could be      any brady arrhythmias associated. Bradycardia can be associated      with the Zanaflex. I will also call Dr. Hardie Pulley office to see      if there has been a recent TSH.  2. Nonobstructive coronary disease. She can continue with primary risk      reduction.  3. Followup. See the patient back in about a month after the results      of her event  monitor.     Rollene Rotunda, MD, [facility]  Electronically Signed    JH/MedQ  DD: 10/26/2006  DT: 10/26/2006  Job #: [id number]   cc:   Lovenia Kim, D.O.

## 2010-10-04 NOTE — Assessment & Plan Note (Signed)
[facility]                            CARDIOLOGY OFFICE NOTE   NAME:Kerri [name], Kerri [name]                         MRN:          [id number]  DATE:01/03/2008                            DOB:          April 25, 1930    PRIMARY CARE PHYSICIAN:  Lovenia Kim, DO   REASON FOR PRESENTATION:  Evaluate the patient with labile blood  pressure and coronary artery disease.   HISTORY OF PRESENT ILLNESS:  The patient is 77 years old.  She brought  herself back to the clinic today to review her blood pressures.  She is  still concerned about them though she brings a very nice diary and her  blood pressures overall are very well controlled.  She has blood  pressures sometimes in the one teens systolic with diastolics in the  60s.  She will occasionally have diastolic in the 90s and a systolic  above 150, but this is fairly uncommon.  She does have headaches, but  she has had these for years and ascribed into be sinus.  She says she  has had less fluctuation in her blood pressure since reducing her  Zanaflex.  She is not describing any chest pressure, neck or arm  discomfort.  She has no new shortness of breath, PND, or orthopnea.  However, she is limited her activities because of back pain.   She was recently found to have a melanoma and is due to have more  extensive resection of this.  This is on her right leg.   PAST MEDICAL HISTORY:  1. Dyslipidemia.  2. Labile blood pressures.  3. Spinal stenosis.  4. Nonobstructive coronary artery disease (40% LAD stenosis by CT).  5. Degenerative joint disease.  6. Shoulder surgery.  7. Knee surgery.  8. Carpal tunnel surgery.  9. Great toe operation.   ALLERGIES/INTOLERANCES:  CODEINE, MORPHINE, and PAIN MEDICATIONS in  general.   MEDICATIONS:  1. Vitamin D.  2. Aleve.  3. Thyroxine 100 mcg daily.  4. Nexium 40 mg daily.  5. Folic acid.  6. Doxycycline.  7. Magnesium oxide.  8. Tizanidine 4 mg daily.  9. Crestor 5  mg daily.  10.Aspirin 81 mg daily.  11.Benicar 5 mg at bedtime.   REVIEW OF SYSTEMS:  As stated in the HPI, and otherwise, negative for  other systems.   PHYSICAL EXAMINATION:  GENERAL:  The patient is in no distress.  VITAL SIGNS:  Blood pressure 148/90, heart rate 79 and regular, weight  136 pounds, and body mass index 24.  HEENT:  Eyelids unremarkable.  Pupils equal, round, and reactive to  light.  Fundi not visualized.  Oral mucosa normal.  NECK:  No jugular distention at 45 degrees.  Carotid upstroke brisk and  symmetrical.  No bruits and no thyromegaly.  LYMPHATICS:  No cervical, axillary, or inguinal adenopathy.  LUNGS:  Clear to auscultation bilaterally.  BACK:  No costovertebral angle tenderness.  CHEST:  Unremarkable.  HEART:  PMI not displaced or sustained.  S1 and S2 within normal limits.  No S3, no S4, clicks, rubs, or murmurs.  ABDOMEN:  Flat, positive bowel sounds normal in frequency and pitch.  No  bruits, rebound, guarding, midline pulsatile mass, or splenomegaly.  SKIN:  No rashes and no nodules.  EXTREMITIES:  A 2+ pulses throughout.  No edema, cyanosis, or clubbing.  NEURO:  Oriented to person, place, and time.  Cranial nerves II through  XII grossly intact.  Motor grossly intact.   EKG, sinus rhythm, rate 78, axes within normal limits, intervals within  normal limits, no acute ST-T wave changes, and premature ectopic  complex.   ASSESSMENT AND PLAN:  1. Hypertension.  The patient's blood pressure is well controlled.  At      this point, I do not think further changes in her regimen is      warranted.  She forgets to take her medicine at night, so she will      start taking the half of Benicar in the morning.  She will keep her      blood pressure diary and we can review this in the future.  2. Coronary artery disease.  We will go ahead and assess the patient's      coronary artery disease with an exercise treadmill test.  I think      she would be able to  complete this adequately despite her back      problems.  3. Dyslipidemia.  She is having this followed by Dr. Elisabeth Most.  A      goal should be an LDL less than 100 and HDL greater than 50.  4. Hypothyroidism.  The patient has this with thyroid replacement      therapy.  She did have a very low TSH in 2008, and I discussed this      with Dr. Elisabeth Most.  I will defer followup with this to Dr.      Elisabeth Most who has since checked these labs.  5. Followup.  We will see her at the time of her POET (plain old      exercise treadmill).    Rollene Rotunda, MD, [facility]  Electronically Signed   JH/MedQ  DD: 01/03/2008  DT: 01/04/2008  Job #: [id number]

## 2010-10-04 NOTE — Assessment & Plan Note (Signed)
[facility]                            CARDIOLOGY OFFICE NOTE   NAME:Kerri [name], Kerri [name]                         MRN:          [id number]  DATE:01/17/2007                            DOB:          Dec 08, 1929    PRIMARY:  Dr. Marisue Brooklyn.   REASON FOR PRESENTATION:  Evaluate patient with hypotension and  palpitations.   HISTORY OF PRESENT ILLNESS:  The patient is 77 years old.  Since I last  saw her, I did discuss with Dr. Wynetta Emery her Zanaflex, which could have been  causing some of her fluctuating blood pressures.  She saw Dr. Wynetta Emery just  recently.  They agreed to take the Zanaflex only at night.  She was also  started on a low dose of Neurontin.  She has had no palpitations,  presyncope or syncope since I saw her.  She has had no chest pain or  shortness of breath.  She remains as active as she can be, being limited  by back pain.   PAST MEDICAL HISTORY:  1. Dyslipidemia.  2. Spinal stenosis.  3. Nonobstructive coronary artery disease (40% LAD stenosis on CT      scan).  4. Degenerative joint disease.  5. Shoulder surgery.  6. Knee surgery.  7. Carpal tunnel surgery.  8. Great toe operation.   ALLERGIES:  CODEINE and MORPHINE.   MEDICATIONS:  1. Fosamax 70 mg weekly.  2. Lipitor 80 mg daily.  3. Thyroxine 100 mg daily.  4. Nexium 40 mg daily.  5. Aleve.  6. Claritin.  7. Zanaflex.  8. Aspirin.  9. Doxycycline.  10.Lidoderm patch.  11.Neurontin.   REVIEW OF SYSTEMS:  As stated in the HPI and otherwise negative for  other systems.   PHYSICAL EXAMINATION:  The patient is in no distress.  Blood pressure 120/70, heart rate 72 and regular.  Weight 144 pounds.  LUNGS:  Clear to auscultation bilaterally.  HEART:  PMI not displaced or sustained, S1 and S2 within normal limits,  no S3, no S4, no clicks, no murmurs.  ABDOMEN:  Flat.  Positive bowel sounds, normal in frequency and pitch.  No bruits, no rebound, no guarding, no midline pulsatile  mass, no  organomegaly.  SKIN:  No rashes, no nodules.  EXTREMITIES:  Pulses 2+.  No edema.   ASSESSMENT AND PLAN:  1. Hypotension/hypertension.  The patient is having no new symptoms      related to this.  Her acute complaints seem to have resolved.  At      this point, no further cardiovascular testing is suggested.  2. Coronary artery disease.  She has nonobstructive coronary disease      and she will continue with aggressive risk reduction.  No further      cardiovascular testing is suggested.  3. Followup will be back in this clinic as needed.     Rollene Rotunda, MD, [facility]  Electronically Signed    JH/MedQ  DD: 01/17/2007  DT: 01/18/2007  Job #: [id number]   cc:   Lovenia Kim, D.O.

## 2010-10-04 NOTE — Procedures (Signed)
[facility]                              EXERCISE TREADMILL   NAME:[name], Kerri BAIZE                         MRN:          [id number]  DATE:01/13/2008                            DOB:          Sep 27, 1929    PRIMARY CARE DOCTOR:  Lovenia Kim, DO   PROCEDURE:  Exercise treadmill test with modified Bruce protocol.   INDICATIONS:  Chest pain.   PROCEDURE NOTE:  The patient was excised using a modified Bruce  protocol.  She was able to exercise for 12 minutes, which completed  stage IV.  She achieved 7.0 METs.  The peak heart rate was 141, which is  97% of predicted.  She achieved an appropriate blood pressures response  with the maximum 179/93.  She had no chest discomfort.  She had  appropriate dyspnea.  She had no ischemic ST-T wave changes.  There were  a few palpitations with premature ectopic complexes.  There was no  sustained dysrhythmias.  She had a normal heart rate recovery.   CONCLUSION:  Negative adequate exercise treadmill test.  There is no  evidence for high-grade obstructive coronary artery disease.  She has a  reasonable exercise tolerance.   PLAN:  Based on the above, no further cardiovascular testing is  suggested.  I have reviewed her blood pressure diary as well.  I think  she is on excellent regimen.  I gave her prescription for exercise based  on the above studies.     Rollene Rotunda, MD, [facility]  Electronically Signed    JH/MedQ  DD: 01/13/2008  DT: 01/14/2008  Job #: [id number]   cc:   Lovenia Kim, D.O.

## 2010-10-04 NOTE — Op Note (Signed)
NAMEARYAHNA, SPAGNA                  ACCOUNT NO.:  [id number]   MEDICAL RECORD NO.:  [id number]          PATIENT TYPE:  INP   LOCATION:  5148                         FACILITY:  [facility]   PHYSICIAN:  Loreta Ave, MD DATE OF BIRTH:  [birth year]   DATE OF PROCEDURE:  09/14/2008  DATE OF DISCHARGE:                               OPERATIVE REPORT   PREOPERATIVE DIAGNOSIS:  Left leg melanoma in-transit lesion.   POSTOPERATIVE DIAGNOSIS:  Left leg melanoma in-transit lesion.   PROCEDURE PERFORMED:  Dr. Violeta Gelinas performed a re-excision of  metastatic melanoma nodules of the left leg.  Dr. Noelle Penner performed split-  thickness skin grafting for 60 cm2 of the left leg defect.   CLINICAL INDICATIONS:  Jeanetta Alonzo is a 77 year old female with  metastatic melanoma of her left leg.  She had previously had a left leg  excision and now has 2 subcutaneous nodules, which were biopsy-proven  melanoma that are a centimeter to 2 cm away from her previous excision  margin.  Previous margins were negative on permanent pathology.   ESTIMATED BLOOD LOSS:  Minimal.   COMPLICATIONS:  None.   IV FLUIDS:  Per Anesthesia.   URINE OUTPUT:  Not recorded.   DRAINS:  None.   COMPLICATIONS:  None.   DESCRIPTION OF OPERATION:  Abigael Mogle was brought to the operating room  and placed in the supine position on the operating room table.  I was  called into the room after Dr. Janee Morn had performed an excision of  left leg melanoma lesions.  He excised these as a one unit leaving a  kidney-shaped 6 x 10 cm defect over the left anterior tibia.  He did  leave periosteum over the tibia upon excising these lesions.  I began by  measuring the defect as 6 x 10 cm.  Next, a 10-cm 2-inch wide skin graft  was harvested from the left thigh with a Zimmer dermatome.  The skin was  lubricated with mineral oil and harvested at twelve one thousandth of an  inch thick.  A lap sponge soaked in saline with epinephrine was  placed  on the left thigh.  The skin graft was meshed and 1.5 to 1 fashion and  trimmed to fit the defect.  It was sewn with a running 4-0 chromic  suture.  Mepilex was placed from the left thigh donor site.  Mepitel was placed over the skin graft and a VAC sponge was cut to fit  the defect.  The VAC dressing was applied and set to 125 mm continuous  suction with good seal.  Sponge and needle counts were reported as  correct x2.  The patient was extubated and transferred to the recovery  room in stable condition.      Loreta Ave, MD  Electronically Signed     CF/MEDQ  D:  09/14/2008  T:  09/14/2008  Job:  [id number]

## 2010-10-04 NOTE — Assessment & Plan Note (Signed)
[facility]                            CARDIOLOGY OFFICE NOTE   NAME:Kerri [name], Kerri [name]                         MRN:          [id number]  DATE:10/03/2006                            DOB:          05/05/1930    This is a 77 year old white female whom Dr. Antoine Poche saw recently for  hypertension, but he did not feel like it needed to be treated at this  time, and to follow it closely.  She also has some nonobstructive  coronary artery disease with a recent cardiac CT scan showing an EF of  51% with a 40% LAD stenosis which was after an abnormal stress perfusion  study.   The patient called in because she has had fluctuating blood pressures.  She wanted to be seen, and she said her blood pressure was 139/79, and  an hour later she walked out to greet her husband and it dropped to  75/40, and she became dizzy; then it came up to 95/58 followed by  121/84.  She is quite anxious and said it was 149/90 this morning and  wants to know why her blood pressure is fluctuating like this.  She also  has noticed that she has had a little bit of chest heaviness and  shortness of breath when she is rushing around, but has no symptoms at  rest.  She said she has been extremely busy and rushing from one  activity to the next.  I once again went over the results of her CT scan  that was done in October of 2007 and told her the only definitive way to  know if this is coming from her coronary artery disease is to do a  cardiac catheterization, which she does not want to pursue.  She wants  to rule out all secondary causes of hypertension because she feels like  she is not normal for her and is quite anxious about these fluctuations.   PHYSICAL EXAMINATION:  VITAL SIGNS:  Blood pressure today in the office  was 103/69, pulse 89, weight is 140 on her home scales (did not weigh  here in the office).  NECK:  Without JVD, hepatojugular reflux, bruit or thyroid enlargement.  LUNGS:   Clear anterior, posterior and lateral.  HEART:  Regular rate and rhythm at 80 beats per minute.  Normal S1 and  S2.  No murmur, rub, bruit, thrill or heave noted.  ABDOMEN:  Soft without organomegaly, masses, lesions or abnormal  tenderness.  EXTREMITIES:  Without clubbing, cyanosis, or edema.  She has good distal  pulses.   IMPRESSION:  1. Fluctuating blood pressure.  2. Coronary disease with abnormal stress perfusion study with mild      fixed inferior defect.  Cardiac CT showed 40% LAD, ejection      fraction of 51%.  3. Dyslipidemia, treated.  4. History of spinal stenosis.  5. Degenerative joint disease.   PLAN AT THIS TIME:  I have ordered a 24-hour urine, catecholamines and  metanephrons to rule out pheochromocytoma.  I have scheduled her to see  Dr. Antoine Poche back in  2-3 weeks, and he can discuss further work-up if he  deems necessary.      Jacolyn Reedy, PA-C       Cecil Cranker, MD, [facility]    ML/MedQ  DD: 10/03/2006  DT: 10/03/2006  Job #: [id number]   cc:   Lovenia Kim, D.O.  Rollene Rotunda, MD, [facility]

## 2010-10-04 NOTE — Op Note (Signed)
NAMEDELMA, DRONE                  ACCOUNT NO.:  [id number]   MEDICAL RECORD NO.:  [id number]          PATIENT TYPE:  INP   LOCATION:  5124                         FACILITY:  [facility]   PHYSICIAN:  Gabrielle Dare. Janee Morn, M.D.DATE OF BIRTH:  28-Jul-1929   DATE OF PROCEDURE:  01/16/2008  DATE OF DISCHARGE:                               OPERATIVE REPORT   PREOPERATIVE DIAGNOSIS:  Melanoma, left anterior shin.   POSTOPERATIVE DIAGNOSIS:  Melanoma, left anterior shin.   PROCEDURES:  1. Left inguinal sentinel lymph node biopsy with blue dye injection      and layered closure.  2. Wide excision of melanoma, left anterior shin 6 x 6 cm.  3. Closure of split-thickness skin graft.   SURGEON:  Gabrielle Dare. Janee Morn, MD   ANESTHESIA:  General with laryngeal mask airway.   HISTORY OF PRESENT ILLNESS:  Ms. Campau is a very pleasant 77 year old  female who I evaluated in the office after she had a biopsy done for  left anterior shin.  The pathology report from this lesion showed a  malignant melanoma at least 2.18 mm in thickness.  There was no  ulceration and no satellitosis; however, the deep margin was involved.  She presents today for left inguinal sentinel lymph node biopsy, wide  excision of melanoma of left anterior shin,  and coverage with split-  thickness skin graft.   PROCEDURE IN DETAIL:  Informed consent was obtained.  After identifying  the patient in the preop holding area, she received the blue dye  injection by Nuclear Medicine and her site was marked.  She received  intravenous antibiotics.  She was  brought to the operating room.  General anesthesia with laryngeal mask airway was administered by  anesthesia staff.  Her left lower extremity all the way up to her hip  was prepped in its entirety.  Prior to prepping the leg out, the  methylene blue and 1-3 solution with injectable saline was injected  intradermally around her melanoma biopsy site in her left anterior shin  and this was  massaged by 4 minutes with the clock.  Again, after things  were prepped and draped, the NeoProbe was used and the hot area was  localized in the left groin.  A towel was left covering the melanoma  site throughout this portion of the procedure.  A 0.25% Marcaine with  epinephrine was injected along the hot spot from the Neoprobe.  A  transverse incision was made.  Subcutaneous tissues were dissected down  through superficial layers and using the NeoProbe as guidance, the hot  blue node was easily identified.  This was circumferentially dissected  with the small lymphatic branches cauterized copiously.  It was excised  and it had a very high count in the upper 200s.  This was sent to  Pathology as hot blue node.  The wound was further explored and the  remainder of the inguinal region was further explored and there was no  counts above single digits.  Wound was copiously irrigated.  Meticulous  hemostasis was ensured.  Subcutaneous tissues were approximated with  a  running 3-0 Vicryl sutures.  Skin was closed with a running 4-0 Monocryl  subcuticular stitch followed by Dermabond.  All counts were correct.  Attention was then directed to the wide excision.  An area greater than  2 cm circumferentially was measured around the melanoma biopsy site.  A  circular incision was made to encompass this area.  Subcutaneous tissues  were dissected down achieving good hemostasis with Bovie cautery and the  skin and subcutaneous tissues were taken down to the fascia and  pretibial tissues right over the tibia.  This wide excision was oriented  for Pathology with 6 x 6 cm in size and it was passed off.  Meticulous  hemostasis was obtained in that wound after irrigating it.  The  instruments used for the wide excision were then passed off the table.  We changed our gloves.  Then, the left thigh area was cleaned of  Betadine on the anterolateral surface and mineral oil was applied and a  dermatome with  1800s setting was used to harvest skin graft.  A nice  piece came out without difficulty.  This was placed on the 1 to 1.5  carrier and run through the mesh machine.  A dry gauze was placed over  the harvest site.  The skin graft was then tacked down over the wide  excision site with interrupted 3-0 Vicryl sutures both circumferentially  and then in the middle after ensuring excellent hemostasis of that bed.  The graft appeared to lie down very nicely.  A piece of Mepitel was cut  to size just larger than the skin graft, and subsequently, bulky fluffed  4 x 4 gauze was placed on the top and this was tacked down  circumferentially with 3-0 Vicryl to create a stent over the skin graft  with even pressure.  Hemostasis was then ensured at the skin graft  harvest site and a piece of Xeroform was trimmed and placed over the top  followed by bulky gauze sterile dressings.  The patient tolerated the  procedure well.  There were no apparent complications.  Sponge, needle,  and instrument counts again were all correct and she was taken to  recovery room in stable condition.      Gabrielle Dare Janee Morn, M.D.  Electronically Signed     BET/MEDQ  D:  01/16/2008  T:  01/17/2008  Job:  [id number]   cc:   Holley Bouche, M.D.  Lovenia Kim, D.O.

## 2010-10-04 NOTE — Assessment & Plan Note (Signed)
[facility]                            CARDIOLOGY OFFICE NOTE   NAME:Turvey, KESHARA KIGER                         MRN:          [id number]  DATE:11/20/2006                            DOB:          04/23/1930    PRIMARY:  Dr. Marisue Brooklyn   REASON FOR PRESENTATION:  Evaluate patient with dizziness and  hypotension.   HISTORY OF PRESENT ILLNESS:  The patient called to be added onto the  schedule.  She has persistent problems with dizziness.  This happens at  the same time almost every morning.  She will feel dizzy and need to lie  down.  She will have her husband take her blood pressure and it will be  in the 90s and sometimes 80s systolic.  She will fall asleep for a  little while and then feel better.  She does not take it the rest of the  day.  She does not have any symptoms the rest of the day.  She feels  somewhat dizzy.  She occasionally will get a headache, maybe associated  with these symptoms.  She does not have any visual disturbance, speech  or motor disturbance.  She does not have any palpitations.  I did have  her wear an event monitor which demonstrated some asymptomatic  infrequent PACs and PVCs but no bradyarrhythmias.  She also had a  thyroid that demonstrated her TSH to be low at 0.03.  She denies any  chest pain or shortness of breath.  She has had no PND or orthopnea.   PAST MEDICAL HISTORY:  Dyslipidemia, spinal stenosis, nonobstructive  coronary disease (40% LED stenosis on CT scan), degenerative joint  disease, shoulder surgery, knee surgery, carpal tunnel surgery, great  toe operation.   ALLERGIES:  CODEINE and MORPHINE.   CURRENT MEDICATIONS:  1. Fosamax 70 mg weekly.  2. Lipitor 80 mg daily.  3. Thyroxin 100 mcg daily.  4. Nexium 40 mg daily.  5. Aleve.  6. Claritin.  7. Zanaflex 4 mg b.i.d.  8. Aspirin 81 mg daily.  9. Doxycycline 200 mg daily.  10.Lidoderm patch.   REVIEW OF SYSTEMS:  As stated in the HPI, otherwise  negative for other  systems.   PHYSICAL EXAMINATION:  The patient is in no distress.  Blood pressure 129/79, no orthostatic blood pressure drop, heart rate  80s and regular.  HEENT:  Eyes unremarkable.  Pupils equal, round and react to light.  Fundi not visualized. Oral mucosa unremarkable.  NECK:  No jugular venous distention.  Wave form within normal limits.  Carotid upstroke brisk and symmetric.  Probable right slight carotid  bruit.  No thyromegaly.  LYMPHATICS:  No cervical, axillary, inguinal adenopathy.  LUNGS:  Clear to auscultation bilaterally.  BACK:  No costovertebral angle tenderness.  CHEST:  Unremarkable.  HEART:  PMI not displaced or sustained.  S1 and S2 within normal limits.  No S3, no S4, no clicks, no rubs, no murmurs.  ABDOMEN:  Flat, positive bowel sounds, normal in frequency and pitch.  No bruits, no rebound, no guarding, no midline pulse, no hepatomegaly  or  splenomegaly.  SKIN:  No rashes.  No nodules.  EXTREMITIES:  Two plus pulses throughout. No edema, no cyanosis, no  clubbing.  NEURO:  Oriented to person, place, time.  Cranial nerves II through XII  grossly intact.  Motor grossly intact.   ASSESSMENT AND PLAN:  1. Dizziness and hypotension.  These are both listed as common      reactions with Zanaflex.  I do not see any other meds that would be      causing this.  She has had an extensive cardiac work up and I do      not see any other etiology for this.  It is possible that bilateral      carotid stenosis associated with the drop in blood pressure would      cause some symptoms.  She has a slight bruit.  I am going to get a      carotid Doppler.  However, I think she needs to talk about the use      of Zanaflex with the prescribing physician who is Dr. Wynetta Emery.  She      has asked me to please try to get a hold of him to discuss this and      I will attempt to do that.  2. Carotid bruit,  as above.  3. Nonobstructive coronary disease.  She can continue  with primary      risk reduction.  4. Followup.  I will see the patient back in about 6 weeks or sooner      if needed.     Rollene Rotunda, MD, [facility]  Electronically Signed    JH/MedQ  DD: 11/20/2006  DT: 11/21/2006  Job #: [id number]

## 2010-10-04 NOTE — Op Note (Signed)
NAMEDATHA, KISSINGER                  ACCOUNT NO.:  [id number]   MEDICAL RECORD NO.:  [id number]          PATIENT TYPE:  INP   LOCATION:  5148                         FACILITY:  [facility]   PHYSICIAN:  Gabrielle Dare. Janee Morn, M.D.DATE OF BIRTH:  1929/10/15   DATE OF PROCEDURE:  DATE OF DISCHARGE:                               OPERATIVE REPORT   PREOPERATIVE DIAGNOSIS:  Metastatic melanoma, left chin (2 nodules).   POSTOPERATIVE DIAGNOSIS:  Metastatic melanoma, left shin (2 nodules).   PROCEDURE:  Wide excision metastatic melanoma 6 x 10 cm, left chin.   SURGEON:  Gabrielle Dare. Janee Morn, MD   ANESTHESIA:  General with laryngeal mask airway.   HISTORY OF PRESENT ILLNESS:  Ms. Goodin is a 77 year old female who last  year underwent a wide excision of a malignant melanoma from her left  chin with split-thickness skin graft coverage.  Margins were negative.  She had a microscopic metastasis in her sentinel lymph node biopsy.  She  went on to get left inguinal lymphadenectomy by Dr. Deveron Furlong at  [facility].  Those lymph nodes were negative.  Unfortunately, however, she  developed 2 small nodules one just superomedial and one lateral to her  wide excision skin graft.  These were biopsied and showed metastatic  melanoma.  She underwent a PET scan under the direction of Dr. Truett Perna,  and this revealed no other evidence of metastatic disease, so she  presents for wide excision of these metastases and Dr. Loreta Ave is going to follow the wide excision with a skin graft coverage.   PROCEDURE IN DETAIL:  Informed consent was obtained. The patient's site  was marked, and she was identified in the preop holding area.  She was  brought to the operating room, general anesthesia with laryngeal mask  airway was then administered by Anesthesia staff.  The entirety of her  left lower extremity was prepped and draped in sterile fashion and area  around her metastatic nodules was measured up with at least 2  cm margin  and this area was measured out to encompass both the nodules.  This was  an area that was 6 x 10 cm in size.  A 0.25% Marcaine with epinephrine  was injected and a incision was made along this outline, subcutaneous  tissues were dissected down, and the entirety of the skin was excised  with these nodules, found to be underlying fascial planes.  There was  some minimal tissue above the periosteum of her tibia in order to  support the skin graft.  The wide excision was removed  in its entirety.  It was marked for orientation with suture and sent to  Pathology.  The patient remained in the operating room with Dr.  Loreta Ave to complete skin graft coverage.  She tolerated the  procedure well without apparent complications.      Gabrielle Dare Janee Morn, M.D.  Electronically Signed     BET/MEDQ  D:  09/14/2008  T:  09/14/2008  Job:  [id number]   cc:   Dollene Cleveland, M.D.  Loreta Ave, MD  Leighton Roach Truett Perna, M.D.  Dr. Deveron Furlong

## 2010-10-04 NOTE — Discharge Summary (Signed)
NAMEEMMAKATE, HYPES NO.:  [id number]   MEDICAL RECORD NO.:  [id number]          PATIENT TYPE:  INP   LOCATION:  5148                         FACILITY:  [facility]   PHYSICIAN:  Loreta Ave, MD DATE OF BIRTH:  23-Apr-1930   DATE OF ADMISSION:  09/14/2008  DATE OF DISCHARGE:  09/17/2008                               DISCHARGE SUMMARY   ADMITTING PHYSICIAN:  Loreta Ave, MD   CONSULTATION:  Gabrielle Dare. Janee Morn, MD   ADMISSION DIAGNOSIS:  Metastatic melanoma, left leg.   DISCHARGE DIAGNOSIS:  Metastatic melanoma, left leg.   CONDITION UPON DISCHARGE:  Good.   OPERATIONS:  On September 14, 2008, the patient underwent a wide local  excision of 2 metastatic melanoma nodules of the left anterior leg by  Dr. Janee Morn.  Dr. Noelle Penner did a split-thickness skin graft in the left  thigh to cover the left leg melanoma defect.   DESCRIPTION OF HOSPITALIZATION:  The patient was admitted to [facility] on September 14, 2008, taken to the operating room by Dr. Janee Morn  and Dr. Noelle Penner, where the above-mentioned procedure was performed.  The  patient tolerated the procedure well, was extubated, and transferred to  the floor in stable condition.  She had a VAC dressing maintained on her  skin graft recipient site.  Her diet was advanced as tolerated and she  was given IV Toradol for analgesia.  On postoperative day #1, the  patient had a significant amount of bleeding under the VAC dressing.  Her VAC was removed and 2 arterial bleeders were located under the skin  graft.  The skin graft was still adherent and hematoma was on top of the  skin graft underneath it.  The bleeding was controlled with superficial  4-0 chromic sutures around the bleeders.  This proved hemostatic, and a  new VAC dressing was applied.  On postoperative day #2, the patient's  VAC dressing was again nonadherent overnight, this time secondary to  electrical failure of the device.  This was  corrected.  On postoperative  day #3, the patient has been set up with a home VAC and the dressing is  working appropriately at this time.  Her pain has been well controlled  throughout her hospitalization and aside from troubles with her VAC, she  has had no problems in the hospital.  She has remained afebrile  throughout her course.  She is being discharged to home with home VAC.  She is being told to leave her VAC on and to leave her left thigh donor  site dressing on and keep both of them dry.  If she is to ambulate, she  is going to do so in a PRAFO boot on her left ankle to minimize shear  across the skin graft.  She is to call Dr. Noelle Penner at [phone] if she has  any questions, concerns, nausea, vomiting, fevers, chills, and pain not  relieved by pain medicine or any questions or concerns.  She has a  follow up appointment with him on Sep 19, 2008, at 10 a.m.  She is  to  call Dr. Janee Morn to schedule follow up in 3-4 weeks.  She is being  given new prescriptions for Toradol 10 mg p.o. 6 hours p.r.n. for pain  and being given 8 pills and told that her maximum duration of therapy is  2 more days.  She is going to take Keflex 500 mg p.o. q.6 h. and Phenergan 25 mg p.o.  q.6 h. p.r.n. for pain.  Additionally, she is given a prescription for  Ultram by Dr. Janee Morn.  She is being told that she may resume her home  medications as previously prescribed.      Loreta Ave, MD  Electronically Signed     CF/MEDQ  D:  09/17/2008  T:  09/17/2008  Job:  [id number]

## 2010-10-06 ENCOUNTER — Ambulatory Visit: Payer: Medicare Other | Admitting: Cardiology

## 2010-10-07 ENCOUNTER — Ambulatory Visit
Admission: RE | Admit: 2010-10-07 | Discharge: 2010-10-07 | Disposition: A | Discharge status: Home / Self Care | Payer: Medicare Other | Source: Ambulatory Visit | Attending: Internal Medicine | Admitting: Internal Medicine

## 2010-10-07 DIAGNOSIS — R102 Pelvic and perineal pain unspecified side: Secondary | ICD-10-CM

## 2010-10-07 DIAGNOSIS — R1033 Periumbilical pain: Secondary | ICD-10-CM

## 2010-10-07 NOTE — Assessment & Plan Note (Signed)
[facility]                              CARDIOLOGY OFFICE NOTE   NAME:Kerri [name], Kerri [name]                         MRN:          [id number]  DATE:02/27/2006                            DOB:          Dec 01, 1929    REASON FOR REFERRAL:  Evaluate the patient had an abnormal Cardiolite.   HISTORY OF PRESENT ILLNESS:  The patient is a pleasant 77 -year-old white  female with an abnormal stress test. She was seen by Dr. Meade Maw in  2005, and apparently had an exercise treadmill test she reports for  shortness of breath. She apparently did reasonably well with this. More  recently, she was noted to have some palpitations and apparent ectopy on an  EKG. Because of this and significant cardiovascular risk factors, she had a  stress profusion study in June of this year. This demonstrated an EF of 56%.  There was a mild fixed anterior defect consistent with attenuation, but a  reversible inferior defect from base to apex. It was suggested that the  patient have a follow-up cardiac catheterization, but she did not want to  have this done. She subsequently presents for second opinion about this.   The patient is limited by spinal stenosis. She does her household chores  including vacuuming which is the most exerting thing she does. She says with  this she denies any chest discomfort, neck discomfort, arm discomfort,  activity induced nausea, vomiting or excessive diaphoresis. She does not  notice any palpitations and has had no pre-syncope or syncope. She will get  short of breath with moderate activity, but not at rest. She denies any  paroxysmal nocturnal dyspnea or orthopnea.   PAST MEDICAL HISTORY:  Dyslipidemia, spinal stenosis, degenerative joint  disease.   PAST SURGICAL HISTORY:  Shoulder surgery, knee surgery, carpal tunnel  surgery, great toe operation.   ALLERGIES:  CODEINE AND MORPHINE.   MEDICATIONS:  Fosamax 70 mg a week, Lipitor 40 mg q.d.,  thyroxine 100  micrograms q.d., Nexium 40 mg a day, methacycline 100 mg q.d., Aleve,  Claritin, Zanaflex, aspirin 81 mg q.d.   SOCIAL HISTORY:  The patient is retired. She is married and has four  children. She cares for a mentally retarded son who lives at home with her.  She has seven grandchildren. She smoked briefly when she as much younger.   FAMILY HISTORY:  Is contributory for her father having apparent coronary  disease and dying with heart failure in his 80's. Her mother died at age 34  and had a heart valve problem. She had a brother at age 4 with bypass and  myocardial infarction. He died from this.   REVIEW OF SYSTEMS:  As stated in the HPI. Positive for reflux, a.m. fatigue,  constipation, seasonal allergies. Negative for other systems.   PHYSICAL EXAMINATION:  The patient is in no distress. Blood pressure: 98/72.  Heart rate: 74 and regular. Weight: 143 lb. Body mass index is 23.  HEENT: Eyes are unremarkable.  Pupils equal, round and reactive to light.  Fundi within normal limits. Oral mucosa  is unremarkable. Upper dentures.  Neck: No jugulovenous distention. Waveform within normal limits. Carotid  upstroke brisk and symmetric. No bruits. No thyromegaly.  LYMPHATICS: No cervical, axillary or inguinal adenopathy.  LUNGS:  Clear to auscultation bilaterally.  BACK: No costovertebral angle tenderness.  CHEST: Unremarkable.  HEART: PMI not displaced or sustained, S1 and S2 within normal limits. No  S3, no S4. No murmurs.  ABDOMEN: Flat, positive bowel sounds. Normal in frequency and pitch. No  bruits. No rebound, no guarding, no midline pulsatile mass, no hepatomegaly,  no splenomegaly.  SKIN: No rashes, no nodules.  EXTREMITIES: 2+ pulses throughout. No edema. No cyanosis, no clubbing.  NEURO: Oriented to person, place and time. Cranial nerves II-XII grossly  intact. Motor grossly intact.   EKG: Sinus rhythm, rate 74, premature ectopic complex, axis within normal   limits, intervals within normal limits. No QRS or T-wave changes.   ASSESSMENT/PLAN:  1. Abnormal stress test. The patient did have abnormal profusion study.      She does have some mild dyspnea. She is somewhat limited in her      activities and so it is hard to judge whether she would have more      exertional symptoms. She does have extensive family history of coronary      disease. Given all of this, I do think it is reasonable to proceed      further to quantify the extent of coronary disease. Cardiac CT,  with      her symptoms, risk factors and abnormal Cardiolite, would be very      reasonable as the next step to exclude obstructive coronary disease.  2. Dyslipidemia. This is followed closely by Dr. Elisabeth Most. Given the      abnormal stress test, I would aim for an HDL in the 50's and LDL lower      than 70 as a goal.  3. Follow-up will be based on the results of the cardiac CT.            ______________________________  Rollene Rotunda, MD, [facility]     JH/MedQ  DD:  02/27/2006  DT:  02/28/2006  Job #:  [id number]   cc:   Lovenia Kim, D.O.

## 2010-10-07 NOTE — Assessment & Plan Note (Signed)
[facility]                            CARDIOLOGY OFFICE NOTE   NAME:Kerri [name], Kerri [name]                         MRN:          [id number]  DATE:09/14/2006                            DOB:          [birth year]    PRIMARY CARE PHYSICIAN:  Lovenia Kim, D.O.   REASON FOR PRESENTATION:  Evaluate patient with hypertension.   HISTORY OF PRESENT ILLNESS:  The patient is a 77 year old, white female,  who I saw in 2007 for evaluation of an abnormal stress perfusion study.  This had demonstrated a mild fixed inferior defect.  A cardiac CAT scan  followed this up demonstrating that the EF was 51%.  This is a low risk  scan with 40% LAD stenosis.   The patient thought she was to have followup, but none was required.  However, she has been keeping tabs on her blood pressure and has been  worried recently because she gets some readings of systolic's in the mid  140s.  She does not have diastolics above 90s.  She often has systolic's  in the 120s, and they fluctuate between this range.  She does not have  any symptoms related to this.  She denies any chest or arm discomfort.  She does have chronic neck pain and chronic back pain.  She does not  have any shortness of breath, denies any PND or orthopnea.  She does not  notice any palpitations, presyncope, or syncope.  She is quite anxious  about her health.  She takes care of her mentally handicapped son but  does not exercise routinely because of this constraint and her  orthopedic problems.   PAST MEDICAL HISTORY:  1. Dyslipidemia.  2. Spinal stenosis.  3. Degenerative joint disease.  4. Shoulder surgery.  5. Knee surgery.  6. Carpal tunnel surgery.  7. Great toe operation.   ALLERGIES:  1. CODEINE.  2. MORPHINE.   MEDICATIONS:  1. Fosamax 70 mg weekly.  2. Lipitor 80 mg daily.  3. Thyroxine 100 mg daily.  4. Nexium 40 mg daily.  5. Doxycycline 200 mg daily.  6. Aleve b.i.d.  7. Claritin.  8.  Zanaflex 4 mg b.i.d.  9. Aspirin 81 mg daily.   REVIEW OF SYSTEMS:  As stated in the History of Present Illness and  otherwise negative for other systems.   PHYSICAL EXAMINATION:  GENERAL:  The patient is in no distress.  VITAL SIGNS:  Blood pressure 132/85, heart rate 96 slightly irregular,  weight 140 pounds.  HEENT:  Eyelids unremarkable, pupils equal, round, and react to light,  fundi not visualized, oral mucosa unremarkable.  NECK:  No jugulovenous distention, wave form within normal limits,  carotid upstroke brisk and symmetric, no bruits, thyromegaly.  LYMPHATICS:  No cervical, axillary, or inguinal adenopathy.  LUNGS:  Clear to auscultation bilaterally.  BACK:  No costovertebral angle tenderness.  CHEST:  Unremarkable.  HEART:  PMI not displaced or sustained, S1 and S2 within normal limits,  no S3, no S4, no clicks, no rubs, no murmurs.  ABDOMEN:  Flat, positive bowel sounds normal in frequency  and pitch, no  bruits, rebound, guarding, no midline pulse or mass, no hepatomegaly, no  splenomegaly.  SKIN:  No rashes, no nodules.  EXTREMITIES:  2+ pulses, no cyanosis, no clubbing, no edema.  NEURO:  Oriented to person, place, and time, cranial nerves II-XII  grossly intact, motor grossly intact.   ELECTROCARDIOGRAM:  Sinus rhythm, rate 96, premature atrial complexes,  axis within normal limits, intervals within normal limits, nonspecific T-  wave flattening.   ASSESSMENT AND PLAN:  1. Hypertension this patient's primary concern today.  I do not think      at this point I would treat this but would keep an eye on it as she      may develop overt hypertension.  She will keep her blood pressure      about three times a week at random times of the day and keep a      blood pressure diary.  She would like to bring this back and review      it, and I will make arrangements.  2. Coronary disease.  We again reviewed her computerized tomography      scan, which shows nonobstructive  disease.  She needs aggressive      risk reduction.  3. Dyslipidemia.  She has a good lipid profile, and we have reviewed      this today.  This was done by Dr. Elisabeth Most.  She will continue on      her current dose of Lipitor.  4. Followup.  She would like to come back in about three months, and      we will go ahead and arrange this.     Rollene Rotunda, MD, [facility]  Electronically Signed   JH/MedQ  DD: 09/14/2006  DT: 09/14/2006  Job #: [id number]   cc:   Lovenia Kim, D.O.

## 2010-10-27 ENCOUNTER — Other Ambulatory Visit: Payer: Self-pay | Admitting: Internal Medicine

## 2010-10-27 DIAGNOSIS — M545 Low back pain, unspecified: Secondary | ICD-10-CM

## 2010-10-28 ENCOUNTER — Ambulatory Visit
Admission: RE | Admit: 2010-10-28 | Discharge: 2010-10-28 | Disposition: A | Discharge status: Home / Self Care | Payer: Medicare Other | Source: Ambulatory Visit | Attending: Internal Medicine | Admitting: Internal Medicine

## 2010-10-28 DIAGNOSIS — M545 Low back pain, unspecified: Secondary | ICD-10-CM

## 2010-11-03 ENCOUNTER — Other Ambulatory Visit: Payer: Self-pay | Admitting: Gastroenterology

## 2010-11-03 ENCOUNTER — Ambulatory Visit (HOSPITAL_COMMUNITY)
Admission: RE | Admit: 2010-11-03 | Discharge: 2010-11-03 | Disposition: A | Discharge status: Home / Self Care | Payer: Medicare Other | Source: Ambulatory Visit | Attending: Gastroenterology | Admitting: Gastroenterology

## 2010-11-03 DIAGNOSIS — J45909 Unspecified asthma, uncomplicated: Secondary | ICD-10-CM | POA: Insufficient documentation

## 2010-11-03 DIAGNOSIS — N281 Cyst of kidney, acquired: Secondary | ICD-10-CM | POA: Insufficient documentation

## 2010-11-03 DIAGNOSIS — E785 Hyperlipidemia, unspecified: Secondary | ICD-10-CM | POA: Insufficient documentation

## 2010-11-03 DIAGNOSIS — I10 Essential (primary) hypertension: Secondary | ICD-10-CM | POA: Insufficient documentation

## 2010-11-03 DIAGNOSIS — K7689 Other specified diseases of liver: Secondary | ICD-10-CM | POA: Insufficient documentation

## 2010-11-03 DIAGNOSIS — M899 Disorder of bone, unspecified: Secondary | ICD-10-CM | POA: Insufficient documentation

## 2010-11-03 DIAGNOSIS — Z09 Encounter for follow-up examination after completed treatment for conditions other than malignant neoplasm: Secondary | ICD-10-CM | POA: Insufficient documentation

## 2010-11-03 DIAGNOSIS — D131 Benign neoplasm of stomach: Secondary | ICD-10-CM | POA: Insufficient documentation

## 2010-11-03 DIAGNOSIS — K219 Gastro-esophageal reflux disease without esophagitis: Secondary | ICD-10-CM | POA: Insufficient documentation

## 2010-11-03 DIAGNOSIS — Z85820 Personal history of malignant melanoma of skin: Secondary | ICD-10-CM | POA: Insufficient documentation

## 2010-11-03 DIAGNOSIS — E039 Hypothyroidism, unspecified: Secondary | ICD-10-CM | POA: Insufficient documentation

## 2010-11-03 DIAGNOSIS — D126 Benign neoplasm of colon, unspecified: Secondary | ICD-10-CM | POA: Insufficient documentation

## 2010-11-03 DIAGNOSIS — R109 Unspecified abdominal pain: Secondary | ICD-10-CM | POA: Insufficient documentation

## 2010-11-07 NOTE — Op Note (Signed)
  Kerri [name], Kerri [name] NO.:  [id number]  MEDICAL RECORD NO.:  [id number]  LOCATION:  [facility]LEN                         FACILITY:  [facility]  PHYSICIAN:  Danise Edge, M.D.   DATE OF BIRTH:  Jan 07, 1930  DATE OF PROCEDURE:  11/03/2010 DATE OF DISCHARGE:                              OPERATIVE REPORT   PROCEDURES:  Esophagogastroduodenoscopy and colonoscopy.  REFERRING PHYSICIAN:  Lovenia Kim, MD.  HISTORY:  Ms. [name] is a 77 year old female born Jan 26, 1930.  The patient has undergone colonoscopic exams in the past to remove neoplastic but noncancerous colon polyps.  She is scheduled today for a surveillance colonoscopy with polypectomy to prevent colon cancer.  The patient has unexplained diffuse upper abdominal discomfort.  She had a stage 4 melanoma removed from her thigh 3 years ago.  She denies nausea, vomiting, diarrhea or constipation.  She takes Nexium chronically to control gastroesophageal reflux.In 2009, CT scan of the abdomen and pelvis showed fatty infiltration of the liver, facet arthritis, and 3 small liver cysts.  A recent transabdominal and transvaginal ultrasound of the pelvis was normal. Abdominal ultrasound showed bilateral renal cysts and a 1.1-cm right hepatic lobe cyst.  ENDOSCOPIST:  Danise Edge, M.D.  PREMEDICATION:  Propofol.  PROCEDURE:  Esophagogastroduodenoscopy:  The Pentax gastroscope was passed through the posterior hypopharynx into the proximal esophagus without difficulty.  The hypopharynx, larynx and vocal cords appeared normal.  Esophagoscopy:  The proximal mid and lower segments of the esophageal mucosa appeared normal.  The squamocolumnar junction is noted at approximately 40 cm from the incisor teeth.  There is no endoscopic evidence for the presence of erosive esophagitis, Barrett's esophagus.  Gastroscopy:  Retroflex view of the gastric cardia and fundus was normal.  In the mid gastric body, two 3-mm benign  appearing  fundic gland type polyps were removed with cold biopsy forceps.  The gastric antrum and pylorus appeared normal.  Duodenoscopy:  The duodenal bulb and descending duodenum appeared normal.  ASSESSMENT:  Two small mid gastric body polyps were removed with cold biopsy forceps; otherwise, normal esophagogastroduodenoscopy.  PROCEDURE:  Surveillance colonoscopy:  Anal inspection and digital rectal exam were normal.  The Pentax pediatric colonoscope was introduced into the rectum and easily advanced to the cecum.  A normal- appearing ileocecal valve and appendiceal orifice were identified. Colonic preparation for the exam today was good.  Rectum normal.  Retroflex view of the distal rectum normal.  Sigmoid colon and descending colon normal.  Splenic flexure normal.  Transverse colon.  From the midtransverse colon, a 5-mm sessile polyp was removed with the cold snare.  Hepatic flexure normal.  Ascending colon normal.  Cecum and ileocecal valve normal.  ASSESSMENT:  A 5-mm sessile polyp was removed from the mid transverse colon; otherwise, normal surveillance proctocolonoscopy to the cecum.          ______________________________ Danise Edge, M.D.     MJ/MEDQ  D:  11/03/2010  T:  11/03/2010  Job:  [id number]  cc:   Lovenia Kim, D.O. Fax: [phone]  Electronically Signed by Danise Edge M.D. on 11/07/2010 04:16:02 PM

## 2010-11-09 ENCOUNTER — Encounter (INDEPENDENT_AMBULATORY_CARE_PROVIDER_SITE_OTHER): Payer: Self-pay | Admitting: General Surgery

## 2010-11-15 ENCOUNTER — Ambulatory Visit (INDEPENDENT_AMBULATORY_CARE_PROVIDER_SITE_OTHER): Payer: Medicare Other | Admitting: Cardiology

## 2010-11-15 DIAGNOSIS — I2510 Atherosclerotic heart disease of native coronary artery without angina pectoris: Secondary | ICD-10-CM

## 2010-11-15 NOTE — Progress Notes (Signed)
Exercise Treadmill Test  Pre-Exercise Testing Evaluation Rhythm: normal sinus  Rate: 72   PR:  .14 QRS:  .07  QT:  .40 QTc: .44     Test  Exercise Tolerance Test Ordering MD: Angelina Sheriff, MD  Interpreting MD:  Angelina Sheriff, MD  Unique Test No: 1  Treadmill:  1  Indication for ETT: CAD  Contraindication to ETT: No   Stress Modality: exercise - treadmill  Cardiac Imaging Performed: non   Protocol: modified Bruce  Max BP:  172/86  Max MPHR (bpm):  141 85% MPR (bpm):  119  MPHR obtained (bpm):  122 % MPHR obtained:  88  Reached 85% MPHR (min:sec):  7:30 Total Exercise Time (min-sec): 9:00  Workload in METS:  4.6 Borg Scale: 12  Reason ETT Terminated:  desired heart rate attained    ST Segment Analysis At Rest: normal ST segments - no evidence of significant ST depression With Exercise: no evidence of significant ST depression  Other Information Arrhythmia:  No Angina during ETT:  absent (0) Quality of ETT:  diagnostic  ETT Interpretation:  normal - no evidence of ischemia by ST analysis  Comments: The patient had an moderate exercise tolerance.  There was no chest pain.  There was an appropriate level of dyspnea.  There were no arrhythmias, a normal heart rate response and normal BP response.  There were no ischemic ST T wave changes and a normal heart rate recovery.   Recommendations: Negative adequate ETT.  No further testing is indicated.  Based on the above I gave the patient a prescription for exercise.

## 2010-11-30 ENCOUNTER — Encounter (INDEPENDENT_AMBULATORY_CARE_PROVIDER_SITE_OTHER): Payer: Self-pay | Admitting: General Surgery

## 2010-11-30 ENCOUNTER — Ambulatory Visit (INDEPENDENT_AMBULATORY_CARE_PROVIDER_SITE_OTHER): Payer: Medicare Other | Admitting: General Surgery

## 2010-11-30 ENCOUNTER — Other Ambulatory Visit (INDEPENDENT_AMBULATORY_CARE_PROVIDER_SITE_OTHER): Payer: Self-pay | Admitting: General Surgery

## 2010-11-30 DIAGNOSIS — C4370 Malignant melanoma of unspecified lower limb, including hip: Secondary | ICD-10-CM

## 2010-11-30 DIAGNOSIS — D0372 Melanoma in situ of left lower limb, including hip: Secondary | ICD-10-CM

## 2010-11-30 NOTE — Progress Notes (Signed)
Subjective:     Patient ID: Kerri [name], female   DOB: 08-01-1929, 77 y.o.   MRN: [id number]    There were no vitals taken for this visit.    HPI Patient presents for removal of subcutaneous nodules on left thigh. Patient has a history of melanoma.  Review of Systems     Objective:   Physical Exam Procedure note: Examination today to saphenous nodules are identified one in the distal medial thigh. And one in the posterior thigh. The posterior one is above the popliteal fossa. Attention was first directed to the medial thigh lesion. This was prepped and draped in a sterile fashion and anesthetized with 1% lidocaine with epinephrine. The palpable subcutaneous nodule was removed with an ellipse of skin. This was sent to pathology. Next similarly the posterior thigh lesion was prepped and draped in sterile fashion, anesthetized with 1% lidocaine with epinephrine, and excised with an ellipse of skin. Both wounds were closed with interrupted nylon. Sterile dressings were applied and the patient tolerated this well.    Assessment:     History of melanoma with subcutaneous nodules left thigh x2. The third nodule felt on previous exam is not palpable at this time.    Plan:        Lesions were sent to pathology. Return to clinic in 2 weeks for suture removal. We will recheck for the third lesion at that time.

## 2010-12-06 ENCOUNTER — Telehealth (INDEPENDENT_AMBULATORY_CARE_PROVIDER_SITE_OTHER): Payer: Self-pay | Admitting: General Surgery

## 2010-12-06 NOTE — Telephone Encounter (Signed)
Notified patient of pathology results showing both lesions removed from LLE were metastatic melanoma.  I recommended F/U with Dr. Truett Perna.  Patient returns next week for suture removal.

## 2010-12-20 ENCOUNTER — Ambulatory Visit (INDEPENDENT_AMBULATORY_CARE_PROVIDER_SITE_OTHER): Payer: Medicare Other

## 2010-12-20 VITALS — BP 98/60

## 2010-12-20 DIAGNOSIS — IMO0002 Reserved for concepts with insufficient information to code with codable children: Secondary | ICD-10-CM

## 2010-12-20 DIAGNOSIS — Z4802 Encounter for removal of sutures: Secondary | ICD-10-CM

## 2010-12-20 NOTE — Progress Notes (Signed)
Pt came in for suture removal from her lt thigh.  There are two areas that are healed and I removed the sutures.  I applied a bandaid to the posterior thigh incision.  The medial incision did not need a bandage.  The pt stated she had been dizzy today and her husband drove her.  I checked her Bp and it was 98/60.  I told her she may need to check her Bp before she takes her med in the am.  It may have been low in the 1st place.  She will try to do that.

## 2010-12-20 NOTE — Patient Instructions (Signed)
Keep incisions clean and dry.  Apply bandaid as needed.  Call with any concerns

## 2010-12-21 ENCOUNTER — Encounter (INDEPENDENT_AMBULATORY_CARE_PROVIDER_SITE_OTHER): Payer: Medicare Other | Admitting: General Surgery

## 2011-01-19 ENCOUNTER — Other Ambulatory Visit: Payer: Self-pay | Admitting: Internal Medicine

## 2011-01-19 DIAGNOSIS — Z1231 Encounter for screening mammogram for malignant neoplasm of breast: Secondary | ICD-10-CM

## 2011-01-20 ENCOUNTER — Encounter (HOSPITAL_BASED_OUTPATIENT_CLINIC_OR_DEPARTMENT_OTHER): Payer: Medicare Other | Admitting: Oncology

## 2011-01-20 DIAGNOSIS — C4370 Malignant melanoma of unspecified lower limb, including hip: Secondary | ICD-10-CM

## 2011-01-20 DIAGNOSIS — I890 Lymphedema, not elsewhere classified: Secondary | ICD-10-CM

## 2011-01-24 ENCOUNTER — Telehealth (INDEPENDENT_AMBULATORY_CARE_PROVIDER_SITE_OTHER): Payer: Self-pay | Admitting: General Surgery

## 2011-01-24 NOTE — Telephone Encounter (Signed)
Please contact Kerri [name] regarding an appt for further excision on her thigh, she wanted you to speak with him to schedule does not want a consultation or to speak with anyone but you. She said you can reach her at home [phone] or after 1 on her cell [phone].

## 2011-01-25 NOTE — Telephone Encounter (Signed)
I called pt and told her Dr Janee Morn is not sure if he can do a wide excision in the office but he needs to see her.  I made an appointment for 9-19 at 10:40.  We need a copy of her most recent pathology from Dermatology

## 2011-02-08 ENCOUNTER — Encounter (INDEPENDENT_AMBULATORY_CARE_PROVIDER_SITE_OTHER): Payer: Self-pay | Admitting: General Surgery

## 2011-02-08 ENCOUNTER — Ambulatory Visit (INDEPENDENT_AMBULATORY_CARE_PROVIDER_SITE_OTHER): Payer: Medicare Other | Admitting: General Surgery

## 2011-02-08 VITALS — BP 108/72 | HR 60 | Temp 97.7°F | Resp 14 | Ht 65.5 in | Wt 143.0 lb

## 2011-02-08 DIAGNOSIS — C4370 Malignant melanoma of unspecified lower limb, including hip: Secondary | ICD-10-CM

## 2011-02-08 NOTE — Patient Instructions (Signed)
Remove dressing tomorrow and may shower

## 2011-02-08 NOTE — Progress Notes (Signed)
Subjective:     Patient ID: Kerri [name], female   DOB: 12/31/1929, 77 y.o.   MRN: [id number]  HPI Patient has history of melanoma left lower extremity with regional metastases. Patient had a lesion biopsied by her dermatologist which returned metastatic melanoma. She presents for reexcision of this area she wants to have this done in the office.  Review of Systems     Objective:   Physical Exam Left foot reveals callous formation around the likely bone spur in the mid metatarsal area laterally. Left anterior shin has previous wide excision site without nodularity. Thigh is examined and no discrete nodules are felt. The patient had fell on her anterior thigh previously been is unable to find it at this time. She is a biopsy site which is right on a previous skin graft harvest location. This is clean granulation tissue. Procedure note: The area of the biopsy site on her anterior lateral thigh was prepped in sterile fashion. Lidocaine with epinephrine was injected for local anesthetic. An elliptical incision providing margins all around the biopsy site was made and the area was totally excised. Hemostasis was obtained with some silver nitrate and pressure. The wound was closed with simple 3-0 nylon sutures. A dressing was applied.    Assessment:     Melanoma left leg with regional metastatic disease   Plan:     Biopsy site was excised with margins as above. We will send this to pathology. We will see her back in 2 weeks for suture removal. Wound care instructions were given

## 2011-02-15 ENCOUNTER — Telehealth (INDEPENDENT_AMBULATORY_CARE_PROVIDER_SITE_OTHER): Payer: Self-pay | Admitting: General Surgery

## 2011-02-15 NOTE — Telephone Encounter (Signed)
Patient called and pathology results given

## 2011-02-21 LAB — GLUCOSE, CAPILLARY: Glucose-Capillary: 104 — ABNORMAL HIGH

## 2011-02-24 LAB — DIFFERENTIAL
Basophils Absolute: 0 10*3/uL (ref 0.0–0.1)
Basophils Relative: 0 % (ref 0–1)
Eosinophils Absolute: 0 10*3/uL (ref 0.0–0.7)
Eosinophils Relative: 0 % (ref 0–5)
Lymphocytes Relative: 6 % — ABNORMAL LOW (ref 12–46)
Lymphs Abs: 0.6 10*3/uL — ABNORMAL LOW (ref 0.7–4.0)
Monocytes Absolute: 0.2 10*3/uL (ref 0.1–1.0)
Monocytes Relative: 2 % — ABNORMAL LOW (ref 3–12)
Neutro Abs: 8.3 10*3/uL — ABNORMAL HIGH (ref 1.7–7.7)
Neutrophils Relative %: 92 % — ABNORMAL HIGH (ref 43–77)

## 2011-02-24 LAB — COMPREHENSIVE METABOLIC PANEL
ALT: 24 U/L (ref 0–35)
AST: 28 U/L (ref 0–37)
Albumin: 4.1 g/dL (ref 3.5–5.2)
Alkaline Phosphatase: 108 U/L (ref 39–117)
BUN: 27 mg/dL — ABNORMAL HIGH (ref 6–23)
CO2: 27 mEq/L (ref 19–32)
Calcium: 9.3 mg/dL (ref 8.4–10.5)
Chloride: 105 mEq/L (ref 96–112)
Creatinine, Ser: 0.71 mg/dL (ref 0.4–1.2)
GFR calc Af Amer: >60 mL/min (ref >60)
GFR calc non Af Amer: >60 mL/min (ref >60)
Glucose, Bld: 127 mg/dL — ABNORMAL HIGH (ref 70–99)
Potassium: 3.7 mEq/L (ref 3.5–5.1)
Sodium: 140 mEq/L (ref 135–145)
Total Bilirubin: 0.7 mg/dL (ref 0.3–1.2)
Total Protein: 6.8 g/dL (ref 6.0–8.3)

## 2011-02-24 LAB — URINALYSIS, ROUTINE W REFLEX MICROSCOPIC
Bilirubin Urine: NEGATIVE
Glucose, UA: NEGATIVE mg/dL
Hgb urine dipstick: NEGATIVE
Ketones, ur: 15 mg/dL — AB
Nitrite: NEGATIVE
Protein, ur: NEGATIVE mg/dL
Specific Gravity, Urine: 1.026 (ref 1.005–1.030)
Urobilinogen, UA: 0.2 mg/dL (ref 0.0–1.0)
pH: 7.5 (ref 5.0–8.0)

## 2011-02-24 LAB — CBC
HCT: 45.6 % (ref 36.0–46.0)
Hemoglobin: 15.4 g/dL — ABNORMAL HIGH (ref 12.0–15.0)
MCHC: 33.7 g/dL (ref 30.0–36.0)
MCV: 91.8 fL (ref 78.0–100.0)
Platelets: 169 10*3/uL (ref 150–400)
RBC: 4.96 MIL/uL (ref 3.87–5.11)
RDW: 13.6 % (ref 11.5–15.5)
WBC: 9.1 10*3/uL (ref 4.0–10.5)

## 2011-02-24 LAB — LIPASE, BLOOD: Lipase: 28 U/L (ref 11–59)

## 2011-03-01 ENCOUNTER — Ambulatory Visit: Payer: Medicare Other

## 2011-03-01 ENCOUNTER — Ambulatory Visit (INDEPENDENT_AMBULATORY_CARE_PROVIDER_SITE_OTHER): Payer: Medicare Other | Admitting: General Surgery

## 2011-03-01 ENCOUNTER — Encounter (INDEPENDENT_AMBULATORY_CARE_PROVIDER_SITE_OTHER): Payer: Self-pay | Admitting: General Surgery

## 2011-03-01 VITALS — BP 132/86 | HR 76 | Temp 98.2°F | Resp 12 | Ht 65.5 in | Wt 142.0 lb

## 2011-03-01 DIAGNOSIS — C4370 Malignant melanoma of unspecified lower limb, including hip: Secondary | ICD-10-CM

## 2011-03-01 NOTE — Progress Notes (Signed)
Subjective:     Patient ID: Kerri [name], female   DOB: November 14, 1929, 77 y.o.   MRN: [id number]  HPI Ms. post is for followup status post reexcision of metastatic leg melanoma left proximal thigh. She's been doing well from that wound. The pathology report showed no residual melanoma in that site. She has requested to have the reexcised 2 places that we removed metastatic nodules from in the past one on her medial left thigh and one in the proximal popliteal fossa on the left side.  Review of Systems     Objective:   Physical Exam Incision on the lateral thigh is healing well the sutures were removed and Steri-Strips were placed there no evidence of infection or complication at that site. There are no gross nodules palpable in the remainder of her left thigh. We located the previous excision site on the medial thigh and inner popliteal fossa. There is no significant nodularity at either site. Procedure note: First the site on the medial thigh was prepped in sterile fashion 1% lidocaine with epinephrine was injected. A elliptical incision was made to encompass the entire previous scar. This was excised and sent for pathology. Hemostasis was obtained with pressure. The wound was closed with a running 4-0 nylon. A dressing was applied. Next the patient's popliteal fossa site was prepped in similar fashion. Local anesthetic was again injected. An area was excised with an ellipse to include the entire previous scar. This was again closed with running 4-0 nylon after hemostasis with pressure. Gauze dressings were placed on both sides. She tolerated this well. Both specimens were sent to pathology.    Assessment:     Metastatic melanoma left lower extremity    Plan:     Sutures from her wide excision were removed as described above. These other lesions were sent to pathology as per the procedure noted above. I will see her back in 2 weeks to remove sutures. Notify her pathology results.

## 2011-03-22 ENCOUNTER — Ambulatory Visit (INDEPENDENT_AMBULATORY_CARE_PROVIDER_SITE_OTHER): Payer: Medicare Other | Admitting: General Surgery

## 2011-03-22 ENCOUNTER — Encounter (INDEPENDENT_AMBULATORY_CARE_PROVIDER_SITE_OTHER): Payer: Self-pay | Admitting: General Surgery

## 2011-03-22 VITALS — BP 122/78 | HR 60 | Temp 97.4°F | Resp 18 | Ht 65.5 in | Wt 141.0 lb

## 2011-03-22 DIAGNOSIS — C4370 Malignant melanoma of unspecified lower limb, including hip: Secondary | ICD-10-CM

## 2011-03-22 NOTE — Progress Notes (Signed)
Subjective:     Patient ID: Kerri [name], female   DOB: Oct 01, 1929, 77 y.o.   MRN: [id number]  HPI Patient history of melanoma left lower extremity underwent reexcision of previous metastatic excision sites 2 weeks ago. The site in her popliteal fossa returned negative pathology. The left medial thigh site demonstrated metastatic melanoma with positive deep margin. She's had no significant mood issues.  Review of Systems     Objective:   Physical Exam Both sites are healing well. Stitches were removed. There is no palpable nodularity. There is no signs of infection.    Assessment:     Metastatic melanoma left lower extremity    Plan:     I plan wide reexcision of left medial thigh metastatic melanoma in 4 weeks. The patient strongly prefers office procedure so we will proceed accordingly. Other questions were answered and wound care instructions were given.

## 2011-03-23 ENCOUNTER — Ambulatory Visit
Admission: RE | Admit: 2011-03-23 | Discharge: 2011-03-23 | Disposition: A | Discharge status: Home / Self Care | Payer: Medicare Other | Source: Ambulatory Visit | Attending: Internal Medicine | Admitting: Internal Medicine

## 2011-03-23 DIAGNOSIS — Z1231 Encounter for screening mammogram for malignant neoplasm of breast: Secondary | ICD-10-CM

## 2011-04-17 ENCOUNTER — Telehealth (INDEPENDENT_AMBULATORY_CARE_PROVIDER_SITE_OTHER): Payer: Self-pay

## 2011-04-17 NOTE — Telephone Encounter (Signed)
The pt called in and complained of left leg pain that started 10 days ago.  It has kept her up 2 nights and she can't put her full weight on the leg.  The pain starts in the groin and the side of the thigh.  There are no signs of infection at her incisions.  She wonders if she needs a MRI or if cancer has spread to the bone.  I spoke to Dr Janee Morn who wants to see her at her appointment this Wednesday to examine her 1st.  I notified the pt.

## 2011-04-19 ENCOUNTER — Ambulatory Visit (INDEPENDENT_AMBULATORY_CARE_PROVIDER_SITE_OTHER): Payer: Medicare Other | Admitting: General Surgery

## 2011-04-19 ENCOUNTER — Other Ambulatory Visit (INDEPENDENT_AMBULATORY_CARE_PROVIDER_SITE_OTHER): Payer: Self-pay | Admitting: General Surgery

## 2011-04-19 ENCOUNTER — Encounter (INDEPENDENT_AMBULATORY_CARE_PROVIDER_SITE_OTHER): Payer: Self-pay | Admitting: General Surgery

## 2011-04-19 VITALS — BP 128/86 | HR 80 | Temp 97.2°F | Resp 16 | Ht 66.0 in | Wt 139.0 lb

## 2011-04-19 DIAGNOSIS — C4370 Malignant melanoma of unspecified lower limb, including hip: Secondary | ICD-10-CM

## 2011-04-19 NOTE — Progress Notes (Signed)
Subjective:     Patient ID: Kerri [name], female   DOB: [birth year], 77 y.o.   MRN: [id number]  HPI Patient is for followup of regional metastasis of melanoma left lower extremity. Patient had to re\re excisions of previous removal sites of metastatic lesions. The area on her left knee was benign however there was residual metastatic melanoma with positive margin of the medial thigh area. She has had some leg pain for 2 days this seemed both medially and laterally. It resolves spontaneously. Since then she has only had some numbness and other symptoms more consistent with her spinal ptosis which is chronic. She also has some arthritis complaints ongoing.  Review of Systems     Objective:   Physical Exam Biopsy site in the medial thigh had a small residual suture which was removed. There was palpable scar tissue. Procedure note: The area was prepped in a sterile fashion 1% lidocaine with epinephrine mixed with bicarbonate was injected liberally around the site. An elliptical incision was made to encompass the entirety of the old scar with approximately 1 cm of margin subcutaneous tissues were dissected down beyond a palpable nodule that was just below the skin. Margins were obtained. The specimen was sent. Next a deeper margin of tissue was also sent separately. Hemostasis was obtained with pressure and the wound was closed with interrupted 3-0 nylon sutures. She tolerated this well.    Assessment:     Melanoma left lower extremity with regional metastasis    Plan:     Medial thigh area was reexcised as above. We will see her back in 2 weeks for suture removal. If the leg pain returned further evaluation of MR may be warranted

## 2011-04-25 ENCOUNTER — Telehealth (INDEPENDENT_AMBULATORY_CARE_PROVIDER_SITE_OTHER): Payer: Self-pay | Admitting: General Surgery

## 2011-04-25 NOTE — Telephone Encounter (Signed)
Notified the patient that pathology was negative

## 2011-04-26 ENCOUNTER — Telehealth: Payer: Self-pay | Admitting: General Surgery

## 2011-04-26 NOTE — Telephone Encounter (Signed)
Spoke to husband to give pathology results

## 2011-05-03 ENCOUNTER — Other Ambulatory Visit: Payer: Self-pay | Admitting: *Deleted

## 2011-05-03 ENCOUNTER — Ambulatory Visit (INDEPENDENT_AMBULATORY_CARE_PROVIDER_SITE_OTHER): Payer: Medicare Other | Admitting: General Surgery

## 2011-05-03 ENCOUNTER — Encounter (INDEPENDENT_AMBULATORY_CARE_PROVIDER_SITE_OTHER): Payer: Self-pay | Admitting: General Surgery

## 2011-05-03 VITALS — BP 126/82 | HR 68 | Temp 97.6°F | Resp 16 | Ht 65.5 in | Wt 138.0 lb

## 2011-05-03 DIAGNOSIS — C4370 Malignant melanoma of unspecified lower limb, including hip: Secondary | ICD-10-CM | POA: Insufficient documentation

## 2011-05-03 NOTE — Progress Notes (Signed)
Patient cancelled appointment on 05/04/11 and wants to reschedule for March 2013. Scheduling dept notified.

## 2011-05-03 NOTE — Progress Notes (Signed)
Subjective:     Patient ID: Kerri [name], female   DOB: 03-10-1930, 77 y.o.   MRN: [id number]  HPI Patient underwent wide reexcision of metastatic melanoma site left medial thigh 2 weeks ago. Pathology showed margins were all negative.  Review of Systems     Objective:   Physical Exam Wound is healing well. Sutures were removed. It appears that one suture fell out ahead of time. There's no evidence of infection. Steri-Strips and a bandage were applied.    Assessment:     Doing well status post reexcision of metastatic melanoma site    Plan:     We'll follow the patient expectantly should she develop any further nodules. She is scheduled to see Dr.Sherrill from oncology tomorrow but she says she is planning on canceling the appointment

## 2011-05-04 ENCOUNTER — Telehealth: Payer: Self-pay | Admitting: Oncology

## 2011-05-04 ENCOUNTER — Ambulatory Visit: Payer: Medicare Other | Admitting: Oncology

## 2011-05-04 NOTE — Telephone Encounter (Signed)
S/w the pt's husband regarding the march 2013 appt

## 2011-05-09 ENCOUNTER — Other Ambulatory Visit: Payer: Self-pay | Admitting: Internal Medicine

## 2011-05-09 DIAGNOSIS — R1030 Lower abdominal pain, unspecified: Secondary | ICD-10-CM

## 2011-05-11 ENCOUNTER — Ambulatory Visit
Admission: RE | Admit: 2011-05-11 | Discharge: 2011-05-11 | Disposition: A | Discharge status: Home / Self Care | Payer: Medicare Other | Source: Ambulatory Visit | Attending: Internal Medicine | Admitting: Internal Medicine

## 2011-05-11 DIAGNOSIS — R1030 Lower abdominal pain, unspecified: Secondary | ICD-10-CM

## 2011-05-11 MED ORDER — IOHEXOL 300 MG/ML  SOLN
100.0000 mL | Freq: Once | INTRAMUSCULAR | Status: AC | PRN
  Administered 2011-05-11: 100 mL via INTRAVENOUS

## 2011-05-17 ENCOUNTER — Encounter (INDEPENDENT_AMBULATORY_CARE_PROVIDER_SITE_OTHER): Payer: Self-pay | Admitting: General Surgery

## 2011-05-17 ENCOUNTER — Ambulatory Visit (INDEPENDENT_AMBULATORY_CARE_PROVIDER_SITE_OTHER): Payer: Medicare Other | Admitting: General Surgery

## 2011-05-17 VITALS — BP 118/70 | HR 82 | Resp 16 | Ht 67.0 in | Wt 139.0 lb

## 2011-05-17 DIAGNOSIS — C4370 Malignant melanoma of unspecified lower limb, including hip: Secondary | ICD-10-CM

## 2011-05-17 NOTE — Progress Notes (Signed)
Subjective:     Patient ID: Kerri [name], female   DOB: [birth year], 77 y.o.   MRN: [id number]  HPI Patient presents for wound check status post reexcision of metastatic melanoma site left medial thigh. The site opened up and she's been doing dressing changes. She has no significant pain there. She is having significant problems from her spinal stenosis as well as some GI complaints with loose stools. She is to see Dr. Laural Benes from GI this Friday.  Review of Systems     Objective:   Physical Exam    Left medial thigh wound has opened, it is clean granulation tissue. A tourniquet was applied. A new bandage was applied. Assessment:     Wound healing by secondary intention after reexcision of metastatic melanoma    Plan:     13 return in 3 weeks for check

## 2011-05-31 ENCOUNTER — Encounter (INDEPENDENT_AMBULATORY_CARE_PROVIDER_SITE_OTHER): Payer: Self-pay | Admitting: General Surgery

## 2011-05-31 ENCOUNTER — Ambulatory Visit (INDEPENDENT_AMBULATORY_CARE_PROVIDER_SITE_OTHER): Payer: Medicare Other | Admitting: General Surgery

## 2011-05-31 VITALS — BP 126/70 | HR 68 | Temp 97.6°F | Resp 16 | Ht 65.5 in | Wt 138.0 lb

## 2011-05-31 DIAGNOSIS — C4370 Malignant melanoma of unspecified lower limb, including hip: Secondary | ICD-10-CM

## 2011-05-31 NOTE — Progress Notes (Signed)
Subjective:     Patient ID: Kerri [name], female   DOB: [birth year], 78 y.o.   MRN: [id number]  HPI Patient underwent wide reexcision of a metastatic nodule of melanoma in her left thigh. The area is nearly healed. She is having increasing neurologic-type symptoms in her left thigh. She has stiffness and pain that radiates around from her hip more anteriorly.  Review of Systems     Objective:   Physical Exam Excision site is nearly completely healed. There is no signs of infection. She has some decreased mobility of the left thigh adductor. There mild areas of decreased light touch sensation    Assessment:     Melanoma left lower extremity with regional metastases and now neurologic pain in the left thigh    Plan:     I offered a trial of the Lyrica to see if improved her pain. She does not want to try that. We will plan an MRI of her left eye to see if we can find a cause of her symptoms. It is possible it is related to scar tissue. It is also possible it is due to further metastatic disease.

## 2011-05-31 NOTE — Patient Instructions (Signed)
We will call with MR results

## 2011-06-01 ENCOUNTER — Other Ambulatory Visit (INDEPENDENT_AMBULATORY_CARE_PROVIDER_SITE_OTHER): Payer: Self-pay | Admitting: General Surgery

## 2011-06-01 DIAGNOSIS — C4370 Malignant melanoma of unspecified lower limb, including hip: Secondary | ICD-10-CM

## 2011-06-04 ENCOUNTER — Ambulatory Visit
Admission: RE | Admit: 2011-06-04 | Discharge: 2011-06-04 | Disposition: A | Discharge status: Home / Self Care | Payer: Medicare Other | Source: Ambulatory Visit | Attending: General Surgery | Admitting: General Surgery

## 2011-06-04 DIAGNOSIS — C4370 Malignant melanoma of unspecified lower limb, including hip: Secondary | ICD-10-CM

## 2011-06-04 MED ORDER — GADOBENATE DIMEGLUMINE 529 MG/ML IV SOLN
20.0000 mL | Freq: Once | INTRAVENOUS | Status: AC | PRN
  Administered 2011-06-04: 20 mL via INTRAVENOUS

## 2011-06-05 ENCOUNTER — Telehealth (INDEPENDENT_AMBULATORY_CARE_PROVIDER_SITE_OTHER): Payer: Self-pay | Admitting: General Surgery

## 2011-06-06 ENCOUNTER — Telehealth: Payer: Self-pay | Admitting: General Surgery

## 2011-06-06 NOTE — Telephone Encounter (Signed)
I reviewed the patient's MR result with her on the phone. Questions answered.

## 2011-07-17 ENCOUNTER — Telehealth (INDEPENDENT_AMBULATORY_CARE_PROVIDER_SITE_OTHER): Payer: Self-pay

## 2011-07-17 NOTE — Telephone Encounter (Signed)
Mrs Hoelzer is trying to get in to see Dr Lequita Halt for her leg/hip pain.  She asked me to fax the MRI report to DR Aluisio.  I faxed a copy to his office [phone] and asked for Dr Lequita Halt to review her MRI and see if she can get in sooner than mid March.

## 2011-07-17 NOTE — Telephone Encounter (Signed)
The patient called me back on voicemail and said she was told Dr Lequita Halt has to see her 1st and it won't be until March.  They want to know if Dr Janee Morn would order the Left hip MRI?  The patient said to let her know if it's an imposition.

## 2011-07-18 NOTE — Telephone Encounter (Signed)
I am fine ordering the L hip MR.  I need to check if it is with or without contrast.  I will let you know.

## 2011-07-19 ENCOUNTER — Telehealth (INDEPENDENT_AMBULATORY_CARE_PROVIDER_SITE_OTHER): Payer: Self-pay | Admitting: General Surgery

## 2011-07-19 ENCOUNTER — Other Ambulatory Visit: Payer: Self-pay | Admitting: General Surgery

## 2011-07-19 DIAGNOSIS — M25552 Pain in left hip: Secondary | ICD-10-CM

## 2011-07-19 NOTE — Telephone Encounter (Signed)
I notified the pt we are setting up her mri.  She requests Valium to take before the mri.  I will ask Dr Janee Morn.  Her pharmacy is [facility] [phone].

## 2011-07-19 NOTE — Telephone Encounter (Signed)
Per Dr Janee Morn ok to order Valium 5mg  po x1, take 1 hour prior to procedure.  I called this in to [facility] [phone].  Pt aware.

## 2011-07-19 NOTE — Telephone Encounter (Signed)
Order was put in for mri without contrast and we will set that up.

## 2011-07-20 NOTE — Telephone Encounter (Signed)
Patient called back today and said she needed the Valium to be 10mg  instead of 5 due to the pain associated with her leg and getting the mri.  I got the ok from Dr Janee Morn and called in 10mg  x1 to [facility] [phone].

## 2011-07-21 ENCOUNTER — Ambulatory Visit
Admission: RE | Admit: 2011-07-21 | Discharge: 2011-07-21 | Disposition: A | Discharge status: Home / Self Care | Payer: Medicare Other | Source: Ambulatory Visit | Attending: General Surgery | Admitting: General Surgery

## 2011-07-21 DIAGNOSIS — M25552 Pain in left hip: Secondary | ICD-10-CM

## 2011-07-24 ENCOUNTER — Telehealth (INDEPENDENT_AMBULATORY_CARE_PROVIDER_SITE_OTHER): Payer: Self-pay

## 2011-07-24 NOTE — Telephone Encounter (Signed)
The patient called requesting her results of the mri.  I notified the pt that it said severe arthritis and postoperative changes in the groin.  She asked me to fax a copy to Dr Lequita Halt which I did.

## 2011-07-26 ENCOUNTER — Other Ambulatory Visit: Payer: Medicare Other

## 2011-08-01 ENCOUNTER — Ambulatory Visit: Payer: Medicare Other | Admitting: Oncology

## 2011-08-10 ENCOUNTER — Other Ambulatory Visit: Payer: Self-pay | Admitting: Orthopedic Surgery

## 2011-08-22 ENCOUNTER — Encounter (HOSPITAL_COMMUNITY): Payer: Self-pay | Admitting: Pharmacy Technician

## 2011-08-29 NOTE — H&P (Signed)
Kerri [name] DOB: Jan 05, 1930  Chief Complaint: left hip pain  History of Present Illness The patient is a 78 year old female who comes in today for a preoperative History and Physical. The patient is scheduled for a left total hip arthroplasty to be performed by Dr. Gus Rankin. Aluisio, MD at [facility] on September 04, 2011 . The patient is a 78 year old female who states that she has left hip pain that radiates into her groin and down her leg. She said that her knee also hurts and is unsure if it is from the radiating pain. She also has hadgiving way symptoms that have been present for 6 months. The symptoms began without any known injury. Pain is in the groin, lateral hip and buttock radiating into her knee. It is limiting what she can and can not do. She even has pain at night and can not sleep well. She had an MRI scan done recently by Dr. Janee Morn to rule out an intrapelvic issue. She had significant arthritic change in the left hip as well as a lot of bony edema and some early collapse. Most predictable means for increased function and decreased pain is a left total hip arthroplasty. Risks and benefits of surgery discussed. PCP: Dr. Elby Showers Derm: Dr. Janee Morn GI: Dr. Laural Benes Cardio: Dr. Antoine Poche    Past MedicalHistory Osteoarthritis, Hip (715.35) Pain in joint, pelvis/thigh (719.45).  Degeneration, lumbar/lumbosacral disc (722.52). Derangement, internal, knee NOS (717.9).  Tear, medial meniscus, knee, current (836.0).  Osteoarthrosis NOS, lower leg (715.96). Osteoporosis Skin Cancer- melanoma Irritable bowel syndrome High blood pressure Hypercholesterolemia Hypothyroidism Gastroesophageal Reflux Disease Dentures Arrhythmia Degenerative Disc Disease   Allergies CODEINE.  MORPHINE.  Latex Adhesive Tape. can use paper tape   Family History Congestive Heart Failure. father Heart Disease. mother and brother Hypertension. father and  brother Osteoarthritis. mother Father. deceased age 43 due to CAD Mother. deceased age 71 due heart disease (valvular disease)   Social History Current work status. retired Copywriter, advertising. 4 Drug/Alcohol Rehab (Previously). no Drug/Alcohol Rehab (Currently). no Alcohol use. current drinker; drinks wine; only occasionally per week Exercise. Exercises never Tobacco use. Former smoker. quit at 78 years old Tobacco / smoke exposure. no Living situation. live with spouse Illicit drug use. no Pain Contract. no Marital status. married Engineer, structural. daughter Advance Directives. Living will, Healthcare POA   Medication History Crestor (10MG  Tablet, Oral) Active. Levothyroxine Sodium ( Tablet, Oral) Active. (changed to .75) NexIUM (40MG  Capsule DR, Oral) Active. Aspirin EC (81MG  Tablet DR, Oral) Active. Magnesium Oxide (400MG  Tablet, Oral) Active. Aleve ( Oral) Specific dose unknown - Active. (stopped 4/6) Vitamin D (1000UNIT Capsule, Oral) Active. (BID) Claritin ( Oral) Specific dose unknown - Active. Hydrochlorothiazide (12.5MG  Capsule, Oral) Active.    Past Surgical History Foot Surgery. bilateral Spinal Surgery Tonsillectomy Colon Polyp Removal - Colonoscopy Arthroscopy of Knee. right Carpal Tunnel Repair. bilateral Cataract Surgery. bilateral Shoulder Surgery    Review of Systems General:Not Present- Chills, Fever, Night Sweats, Fatigue, Weight Gain, Weight Loss and Memory Loss. Skin:Present- Itching. Not Present- Hives, Rash, Eczema and Lesions. HEENT:Present- Dentures. Not Present- Tinnitus, Headache, Double Vision, Visual Loss and Hearing Loss. Respiratory:Not Present- Shortness of breath with exertion, Shortness of breath at rest, Allergies, Coughing up blood and Chronic Cough. Cardiovascular:Not Present- Chest Pain, Racing/skipping heartbeats, Difficulty Breathing Lying Down, Murmur, Swelling and  Palpitations. Gastrointestinal:Present- Constipation. Not Present- Bloody Stool, Heartburn, Abdominal Pain, Vomiting, Nausea, Diarrhea, Difficulty Swallowing, Jaundice and Loss of appetitie. Female Genitourinary:Not Present- Blood in  Urine, Urinary frequency, Weak urinary stream, Discharge, Flank Pain, Incontinence, Painful Urination, Urgency, Urinary Retention and Urinating at Night. Musculoskeletal:Present- Joint Pain, Back Pain and Morning Stiffness. Not Present- Muscle Weakness, Muscle Pain, Joint Swelling and Spasms. Neurological:Not Present- Tremor, Dizziness, Blackout spells, Paralysis, Difficulty with balance and Weakness. Psychiatric:Not Present- Insomnia.   Vitals Weight: 136 lb Height: 65 in Body Surface Area: 1.68 m Body Mass Index: 22.63 kg/m Pulse: 77 (Regular) Resp.: 16 (Unlabored) BP: 127/77 (Sitting, Left Arm, Standard)    Physical Exam General Mental Status - Alert, cooperative and good historian. General Appearance- pleasant. Not in acute distress. Orientation- Oriented X3. Build & Nutrition- Well nourished and Well developed. Head and Neck Head- normocephalic, atraumatic . Neck Global Assessment- supple. no bruit auscultated on the right and no bruit auscultated on the left. Eye Pupil- Bilateral- PERRLA. Motion- Bilateral- EOMI. Chest and Lung Exam Auscultation: Breath sounds:- clear at anterior chest wall and - clear at posterior chest wall. Adventitious sounds:- No Adventitious sounds. Cardiovascular Auscultation:Rhythm- Regularly irregular. Heart Sounds- S1 WNL and S2 WNL. Murmurs & Other Heart Sounds:Auscultation of the heart reveals - No Murmurs. Abdomen Palpation/Percussion:Tenderness- Abdomen is non-tender to palpation. Rigidity (guarding)- Abdomen is soft. Auscultation:Auscultation of the abdomen reveals - Bowel sounds normal. Female Genitourinary Not done, not pertinent to present illness Peripheral  Vascular Upper Extremity: Palpation:- Pulses bilaterally normal. Lower Extremity: Palpation:- Pulses bilaterally normal. Neurologic Examination of related systems reveals - normal muscle strength and tone in all extremities. Neurologic evaluation reveals - normal sensation and upper and lower extremity deep tendon reflexes intact bilaterally . Musculoskeletal Her left hip can be flexed to 90, minimal internal rotation, about 20 external rotation, 20 abduction. She does have pain on attempted range of motion of the hip. No pain with the right hip. She is short a little bit on the left lower extremity compared to the right. It is about 3/8 of an inch shorter. Gait pattern is significantly antalgic. Right knee normal painless ROM. Left knee pain with motion. Nontender to palpation. No joint effusion. Ligaments intact.  RADIOGRAPHS: She has significant bony edema as well as some collapse of the femoral head. AP pelvis and lateral of the hip and she is completely bone on bone with some small areas of femoral head collapse.   Assessment & Plan Osteoarthritis, Hip (715.35) Left total hip arthroplasty      Dimitri Ped, PA-C

## 2011-09-01 ENCOUNTER — Encounter (HOSPITAL_COMMUNITY)
Admission: RE | Admit: 2011-09-01 | Discharge: 2011-09-01 | Disposition: A | Discharge status: Home / Self Care | Payer: Medicare Other | Source: Ambulatory Visit | Attending: Orthopedic Surgery | Admitting: Orthopedic Surgery

## 2011-09-01 ENCOUNTER — Other Ambulatory Visit: Payer: Self-pay

## 2011-09-01 ENCOUNTER — Ambulatory Visit (HOSPITAL_COMMUNITY)
Admission: RE | Admit: 2011-09-01 | Discharge: 2011-09-01 | Disposition: A | Discharge status: Home / Self Care | Payer: Medicare Other | Source: Ambulatory Visit | Attending: Orthopedic Surgery | Admitting: Orthopedic Surgery

## 2011-09-01 ENCOUNTER — Encounter (HOSPITAL_COMMUNITY): Payer: Self-pay

## 2011-09-01 DIAGNOSIS — I2510 Atherosclerotic heart disease of native coronary artery without angina pectoris: Secondary | ICD-10-CM | POA: Insufficient documentation

## 2011-09-01 DIAGNOSIS — M1610 Unilateral primary osteoarthritis, unspecified hip: Secondary | ICD-10-CM | POA: Insufficient documentation

## 2011-09-01 DIAGNOSIS — Z01812 Encounter for preprocedural laboratory examination: Secondary | ICD-10-CM | POA: Insufficient documentation

## 2011-09-01 DIAGNOSIS — K219 Gastro-esophageal reflux disease without esophagitis: Secondary | ICD-10-CM | POA: Insufficient documentation

## 2011-09-01 DIAGNOSIS — I10 Essential (primary) hypertension: Secondary | ICD-10-CM | POA: Insufficient documentation

## 2011-09-01 DIAGNOSIS — M169 Osteoarthritis of hip, unspecified: Secondary | ICD-10-CM | POA: Insufficient documentation

## 2011-09-01 DIAGNOSIS — E039 Hypothyroidism, unspecified: Secondary | ICD-10-CM | POA: Insufficient documentation

## 2011-09-01 DIAGNOSIS — Z01810 Encounter for preprocedural cardiovascular examination: Secondary | ICD-10-CM | POA: Insufficient documentation

## 2011-09-01 HISTORY — DX: Irritable bowel syndrome, unspecified: K58.9

## 2011-09-01 HISTORY — DX: Gastro-esophageal reflux disease without esophagitis: K21.9

## 2011-09-01 LAB — URINALYSIS, ROUTINE W REFLEX MICROSCOPIC
Bilirubin Urine: NEGATIVE
Glucose, UA: NEGATIVE mg/dL
Hgb urine dipstick: NEGATIVE
Leukocytes, UA: NEGATIVE
Nitrite: NEGATIVE
Protein, ur: NEGATIVE mg/dL
Specific Gravity, Urine: 1.024 (ref 1.005–1.030)
Urobilinogen, UA: 0.2 mg/dL (ref 0.0–1.0)
pH: 6 (ref 5.0–8.0)

## 2011-09-01 LAB — APTT: aPTT: 39 seconds — ABNORMAL HIGH (ref 24–37)

## 2011-09-01 LAB — SURGICAL PCR SCREEN
MRSA, PCR: NEGATIVE
Staphylococcus aureus: NEGATIVE

## 2011-09-01 LAB — COMPREHENSIVE METABOLIC PANEL
ALT: 17 U/L (ref 0–35)
AST: 23 U/L (ref 0–37)
Albumin: 4.2 g/dL (ref 3.5–5.2)
Alkaline Phosphatase: 113 U/L (ref 39–117)
BUN: 23 mg/dL (ref 6–23)
CO2: 27 mEq/L (ref 19–32)
Calcium: 9.7 mg/dL (ref 8.4–10.5)
Chloride: 102 mEq/L (ref 96–112)
Creatinine, Ser: 0.74 mg/dL (ref 0.50–1.10)
GFR calc Af Amer: >90 mL/min (ref >90)
GFR calc non Af Amer: 79 mL/min — ABNORMAL LOW (ref >90)
Glucose, Bld: 97 mg/dL (ref 70–99)
Potassium: 3.3 mEq/L — ABNORMAL LOW (ref 3.5–5.1)
Sodium: 140 mEq/L (ref 135–145)
Total Bilirubin: 0.3 mg/dL (ref 0.3–1.2)
Total Protein: 7.5 g/dL (ref 6.0–8.3)

## 2011-09-01 LAB — CBC
HCT: 39.5 % (ref 36.0–46.0)
Hemoglobin: 12.7 g/dL (ref 12.0–15.0)
MCH: 26.7 pg (ref 26.0–34.0)
MCHC: 32.2 g/dL (ref 30.0–36.0)
MCV: 83 fL (ref 78.0–100.0)
Platelets: 225 10*3/uL (ref 150–400)
RBC: 4.76 MIL/uL (ref 3.87–5.11)
RDW: 14.9 % (ref 11.5–15.5)
WBC: 5.8 10*3/uL (ref 4.0–10.5)

## 2011-09-01 LAB — ABO/RH: ABO/RH(D): A POS

## 2011-09-01 LAB — PROTIME-INR
INR: 1.02 (ref 0.00–1.49)
Prothrombin Time: 13.6 seconds (ref 11.6–15.2)

## 2011-09-01 MED ORDER — CHLORHEXIDINE GLUCONATE 4 % EX LIQD
60.0000 mL | Freq: Once | CUTANEOUS | Status: DC
  Filled 2011-09-01: qty 60

## 2011-09-01 NOTE — Patient Instructions (Addendum)
22 [name]  09/01/2011   Your procedure is scheduled on:  09/04/11  Report to SHORT STAY DEPT  at 2:00 PM.  Call this number if you have problems the morning of surgery: [phone]   Remember:   Do not eat food  AFTER MIDNIGHT   May have clear liquids UNTIL 6 HOURS BEFORE SURGERY (11:00 AM)     Take these medicines the morning of surgery with A SIP OF WATER: NEXIUM / LEVOTHROID   Do not wear jewelry, make-up or nail polish.  Do not wear lotions, powders, or perfumes.   Do not shave legs or underarms 48 hrs. before surgery (men may shave face)  Do not bring valuables to the hospital.  Contacts, dentures or bridgework may not be worn into surgery.  Leave suitcase in the car. After surgery it may be brought to your room.  For patients admitted to the hospital, checkout time is 11:00 AM the day of discharge.   Patients discharged the day of surgery will not be allowed to drive home.  Name and phone number of your driver:   Special Instructions:   Please read over the following fact sheets that you were given: MRSA             Information / Incentive Spirometer               SHOWER WITH BETASEPT THE NIGHT BEFORE SURGERY AND THE             MORNING OF SURGERY

## 2011-09-04 ENCOUNTER — Encounter (HOSPITAL_COMMUNITY): Payer: Self-pay | Admitting: Anesthesiology

## 2011-09-04 ENCOUNTER — Inpatient Hospital Stay (HOSPITAL_COMMUNITY)
Admission: RE | Admit: 2011-09-04 | Discharge: 2011-09-08 | DRG: 470 | Disposition: A | Discharge status: Skilled Nursing Facility | Payer: Medicare Other | Source: Ambulatory Visit | Attending: Orthopedic Surgery | Admitting: Orthopedic Surgery

## 2011-09-04 ENCOUNTER — Ambulatory Visit (HOSPITAL_COMMUNITY): Payer: Medicare Other | Admitting: Anesthesiology

## 2011-09-04 ENCOUNTER — Encounter (HOSPITAL_COMMUNITY): Payer: Self-pay | Admitting: *Deleted

## 2011-09-04 ENCOUNTER — Encounter (HOSPITAL_COMMUNITY)
Admission: RE | Disposition: A | Discharge status: Skilled Nursing Facility | Payer: Self-pay | Source: Ambulatory Visit | Attending: Orthopedic Surgery

## 2011-09-04 ENCOUNTER — Ambulatory Visit (HOSPITAL_COMMUNITY): Payer: Medicare Other

## 2011-09-04 DIAGNOSIS — E039 Hypothyroidism, unspecified: Secondary | ICD-10-CM | POA: Diagnosis present

## 2011-09-04 DIAGNOSIS — I2510 Atherosclerotic heart disease of native coronary artery without angina pectoris: Secondary | ICD-10-CM | POA: Diagnosis present

## 2011-09-04 DIAGNOSIS — D62 Acute posthemorrhagic anemia: Secondary | ICD-10-CM | POA: Diagnosis not present

## 2011-09-04 DIAGNOSIS — R110 Nausea: Secondary | ICD-10-CM | POA: Diagnosis not present

## 2011-09-04 DIAGNOSIS — M169 Osteoarthritis of hip, unspecified: Secondary | ICD-10-CM | POA: Diagnosis present

## 2011-09-04 DIAGNOSIS — K219 Gastro-esophageal reflux disease without esophagitis: Secondary | ICD-10-CM | POA: Diagnosis present

## 2011-09-04 DIAGNOSIS — Z96649 Presence of unspecified artificial hip joint: Secondary | ICD-10-CM

## 2011-09-04 DIAGNOSIS — E876 Hypokalemia: Secondary | ICD-10-CM | POA: Diagnosis not present

## 2011-09-04 DIAGNOSIS — M1610 Unilateral primary osteoarthritis, unspecified hip: Principal | ICD-10-CM | POA: Diagnosis present

## 2011-09-04 DIAGNOSIS — I10 Essential (primary) hypertension: Secondary | ICD-10-CM | POA: Diagnosis present

## 2011-09-04 HISTORY — PX: TOTAL HIP ARTHROPLASTY: SHX124

## 2011-09-04 SURGERY — ARTHROPLASTY, HIP, TOTAL,POSTERIOR APPROACH
Anesthesia: General | Site: Hip | Laterality: Left | Wound class: Clean

## 2011-09-04 MED ORDER — FENTANYL CITRATE 0.05 MG/ML IJ SOLN
INTRAMUSCULAR | Status: DC | PRN
  Administered 2011-09-04: 100 ug via INTRAVENOUS
  Administered 2011-09-04 (×3): 50 ug via INTRAVENOUS

## 2011-09-04 MED ORDER — BUPIVACAINE LIPOSOME 1.3 % IJ SUSP
20.0000 mL | Freq: Once | INTRAMUSCULAR | Status: AC
  Administered 2011-09-04: 20 mL
  Filled 2011-09-04: qty 20

## 2011-09-04 MED ORDER — LACTATED RINGERS IV SOLN: INTRAVENOUS | Status: DC

## 2011-09-04 MED ORDER — METHOCARBAMOL 100 MG/ML IJ SOLN
500.0000 mg | Freq: Four times a day (QID) | INTRAVENOUS | Status: DC | PRN
  Administered 2011-09-04 – 2011-09-05 (×2): 500 mg via INTRAVENOUS
  Filled 2011-09-04 (×3): qty 5

## 2011-09-04 MED ORDER — KCL IN DEXTROSE-NACL 20-5-0.9 MEQ/L-%-% IV SOLN
INTRAVENOUS | Status: DC
  Administered 2011-09-04 – 2011-09-06 (×3): via INTRAVENOUS
  Filled 2011-09-04 (×4): qty 1000

## 2011-09-04 MED ORDER — DIPHENHYDRAMINE HCL 50 MG/ML IJ SOLN: 12.5000 mg | Freq: Four times a day (QID) | INTRAMUSCULAR | Status: DC | PRN

## 2011-09-04 MED ORDER — POLYETHYLENE GLYCOL 3350 17 GM/SCOOP PO POWD
17.0000 g | Freq: Every day | ORAL | Status: DC
  Administered 2011-09-06 – 2011-09-07 (×2): 17 g via ORAL
  Filled 2011-09-04: qty 255

## 2011-09-04 MED ORDER — MENTHOL 3 MG MT LOZG: 1.0000 | LOZENGE | OROMUCOSAL | Status: DC | PRN

## 2011-09-04 MED ORDER — DOCUSATE SODIUM 100 MG PO CAPS
100.0000 mg | ORAL_CAPSULE | Freq: Two times a day (BID) | ORAL | Status: DC
  Administered 2011-09-07 – 2011-09-08 (×3): 100 mg via ORAL
  Filled 2011-09-04 (×9): qty 1

## 2011-09-04 MED ORDER — ATORVASTATIN CALCIUM 20 MG PO TABS
20.0000 mg | ORAL_TABLET | Freq: Every day | ORAL | Status: DC
  Administered 2011-09-05 – 2011-09-07 (×3): 20 mg via ORAL
  Filled 2011-09-04 (×4): qty 1

## 2011-09-04 MED ORDER — ACETAMINOPHEN 10 MG/ML IV SOLN
1000.0000 mg | Freq: Four times a day (QID) | INTRAVENOUS | Status: AC
  Administered 2011-09-04 – 2011-09-05 (×4): 1000 mg via INTRAVENOUS
  Filled 2011-09-04 (×6): qty 100

## 2011-09-04 MED ORDER — NEOSTIGMINE METHYLSULFATE 1 MG/ML IJ SOLN
INTRAMUSCULAR | Status: DC | PRN
  Administered 2011-09-04: 4 mg via INTRAVENOUS

## 2011-09-04 MED ORDER — CLOBETASOL PROPIONATE 0.05 % EX CREA
TOPICAL_CREAM | Freq: Two times a day (BID) | CUTANEOUS | Status: DC | PRN
  Filled 2011-09-04: qty 15

## 2011-09-04 MED ORDER — MIDAZOLAM HCL 5 MG/5ML IJ SOLN
INTRAMUSCULAR | Status: DC | PRN
  Administered 2011-09-04: 1 mg via INTRAVENOUS

## 2011-09-04 MED ORDER — LEVOTHYROXINE SODIUM 75 MCG PO TABS
75.0000 ug | ORAL_TABLET | Freq: Every day | ORAL | Status: DC
  Administered 2011-09-06 – 2011-09-08 (×3): 75 ug via ORAL
  Filled 2011-09-04 (×4): qty 1

## 2011-09-04 MED ORDER — GLYCOPYRROLATE 0.2 MG/ML IJ SOLN
INTRAMUSCULAR | Status: DC | PRN
  Administered 2011-09-04: .6 mg via INTRAVENOUS

## 2011-09-04 MED ORDER — ROCURONIUM BROMIDE 100 MG/10ML IV SOLN
INTRAVENOUS | Status: DC | PRN
  Administered 2011-09-04: 25 mg via INTRAVENOUS

## 2011-09-04 MED ORDER — METOCLOPRAMIDE HCL 10 MG PO TABS: 5.0000 mg | ORAL_TABLET | Freq: Three times a day (TID) | ORAL | Status: DC | PRN

## 2011-09-04 MED ORDER — PHENOL 1.4 % MT LIQD: 1.0000 | OROMUCOSAL | Status: DC | PRN

## 2011-09-04 MED ORDER — TEMAZEPAM 15 MG PO CAPS: 15.0000 mg | ORAL_CAPSULE | Freq: Every evening | ORAL | Status: DC | PRN

## 2011-09-04 MED ORDER — METHOCARBAMOL 500 MG PO TABS
500.0000 mg | ORAL_TABLET | Freq: Four times a day (QID) | ORAL | Status: DC | PRN
  Administered 2011-09-06 – 2011-09-08 (×6): 500 mg via ORAL
  Filled 2011-09-04 (×6): qty 1

## 2011-09-04 MED ORDER — ACETAMINOPHEN 10 MG/ML IV SOLN
1000.0000 mg | Freq: Once | INTRAVENOUS | Status: DC
  Filled 2011-09-04: qty 100

## 2011-09-04 MED ORDER — DIPHENHYDRAMINE HCL 12.5 MG/5ML PO ELIX
12.5000 mg | ORAL_SOLUTION | ORAL | Status: DC | PRN
  Administered 2011-09-05: 25 mg via ORAL
  Administered 2011-09-05: 12.5 mg via ORAL
  Filled 2011-09-04: qty 10
  Filled 2011-09-04: qty 5

## 2011-09-04 MED ORDER — ONDANSETRON HCL 4 MG/2ML IJ SOLN
4.0000 mg | Freq: Four times a day (QID) | INTRAMUSCULAR | Status: DC | PRN
  Administered 2011-09-05 (×2): 4 mg via INTRAVENOUS
  Filled 2011-09-04 (×3): qty 2

## 2011-09-04 MED ORDER — FLEET ENEMA 7-19 GM/118ML RE ENEM: 1.0000 | ENEMA | Freq: Once | RECTAL | Status: AC | PRN

## 2011-09-04 MED ORDER — CEFAZOLIN SODIUM 1-5 GM-% IV SOLN
1.0000 g | Freq: Four times a day (QID) | INTRAVENOUS | Status: AC
  Administered 2011-09-05 (×3): 1 g via INTRAVENOUS
  Filled 2011-09-04 (×3): qty 50

## 2011-09-04 MED ORDER — CLOBETASOL PROPIONATE EMULSION 0.05 % EX FOAM: 1.0000 "application " | Freq: Two times a day (BID) | CUTANEOUS | Status: DC | PRN

## 2011-09-04 MED ORDER — HYDROMORPHONE HCL PF 1 MG/ML IJ SOLN
INTRAMUSCULAR | Status: AC
  Administered 2011-09-04: 0.5 mg via INTRAVENOUS
  Filled 2011-09-04: qty 1

## 2011-09-04 MED ORDER — PROMETHAZINE HCL 25 MG/ML IJ SOLN: 6.2500 mg | INTRAMUSCULAR | Status: DC | PRN

## 2011-09-04 MED ORDER — ONDANSETRON HCL 4 MG PO TABS: 4.0000 mg | ORAL_TABLET | Freq: Four times a day (QID) | ORAL | Status: DC | PRN

## 2011-09-04 MED ORDER — ONDANSETRON HCL 4 MG/2ML IJ SOLN
INTRAMUSCULAR | Status: DC | PRN
  Administered 2011-09-04: 4 mg via INTRAVENOUS

## 2011-09-04 MED ORDER — LACTATED RINGERS IV SOLN
INTRAVENOUS | Status: DC | PRN
  Administered 2011-09-04 (×2): via INTRAVENOUS

## 2011-09-04 MED ORDER — ACETAMINOPHEN 650 MG RE SUPP: 650.0000 mg | Freq: Four times a day (QID) | RECTAL | Status: DC | PRN

## 2011-09-04 MED ORDER — PROPOFOL 10 MG/ML IV EMUL
INTRAVENOUS | Status: DC | PRN
  Administered 2011-09-04: 150 mg via INTRAVENOUS

## 2011-09-04 MED ORDER — PANTOPRAZOLE SODIUM 40 MG PO TBEC
80.0000 mg | DELAYED_RELEASE_TABLET | Freq: Every day | ORAL | Status: DC
  Filled 2011-09-04: qty 2

## 2011-09-04 MED ORDER — SUCCINYLCHOLINE CHLORIDE 20 MG/ML IJ SOLN
INTRAMUSCULAR | Status: DC | PRN
  Administered 2011-09-04: 100 mg via INTRAVENOUS

## 2011-09-04 MED ORDER — BISACODYL 10 MG RE SUPP: 10.0000 mg | Freq: Every day | RECTAL | Status: DC | PRN

## 2011-09-04 MED ORDER — HYDROMORPHONE HCL PF 1 MG/ML IJ SOLN: 0.5000 mg | INTRAMUSCULAR | Status: DC | PRN

## 2011-09-04 MED ORDER — FENTANYL 12 MCG/HR TD PT72: 12.5000 ug | MEDICATED_PATCH | TRANSDERMAL | Status: DC

## 2011-09-04 MED ORDER — HYDROCHLOROTHIAZIDE 12.5 MG PO CAPS
12.5000 mg | ORAL_CAPSULE | Freq: Every day | ORAL | Status: DC
  Administered 2011-09-06 – 2011-09-08 (×3): 12.5 mg via ORAL
  Filled 2011-09-04 (×4): qty 1

## 2011-09-04 MED ORDER — LORATADINE 10 MG PO TABS
10.0000 mg | ORAL_TABLET | Freq: Every day | ORAL | Status: DC
  Administered 2011-09-06 – 2011-09-08 (×3): 10 mg via ORAL
  Filled 2011-09-04 (×5): qty 1

## 2011-09-04 MED ORDER — POLYETHYLENE GLYCOL 3350 17 G PO PACK
17.0000 g | PACK | Freq: Every day | ORAL | Status: DC | PRN
  Filled 2011-09-04: qty 1

## 2011-09-04 MED ORDER — EPHEDRINE SULFATE 50 MG/ML IJ SOLN
INTRAMUSCULAR | Status: DC | PRN
  Administered 2011-09-04: 10 mg via INTRAVENOUS

## 2011-09-04 MED ORDER — CEFAZOLIN SODIUM-DEXTROSE 2-3 GM-% IV SOLR
2.0000 g | INTRAVENOUS | Status: AC
  Administered 2011-09-04: 1 g via INTRAVENOUS

## 2011-09-04 MED ORDER — POLYETHYLENE GLYCOL 3350 17 G PO PACK
17.0000 g | PACK | Freq: Every day | ORAL | Status: DC | PRN
  Administered 2011-09-08: 17 g via ORAL
  Filled 2011-09-04: qty 1

## 2011-09-04 MED ORDER — MEPERIDINE HCL 50 MG/ML IJ SOLN: 6.2500 mg | INTRAMUSCULAR | Status: DC | PRN

## 2011-09-04 MED ORDER — RIVAROXABAN 10 MG PO TABS
10.0000 mg | ORAL_TABLET | Freq: Every day | ORAL | Status: DC
  Administered 2011-09-05 – 2011-09-08 (×4): 10 mg via ORAL
  Filled 2011-09-04 (×4): qty 1

## 2011-09-04 MED ORDER — ACETAMINOPHEN 325 MG PO TABS
650.0000 mg | ORAL_TABLET | Freq: Four times a day (QID) | ORAL | Status: DC | PRN
  Filled 2011-09-04: qty 2

## 2011-09-04 MED ORDER — CEFAZOLIN SODIUM 1-5 GM-% IV SOLN
INTRAVENOUS | Status: AC
  Filled 2011-09-04: qty 50

## 2011-09-04 MED ORDER — HYDROMORPHONE HCL PF 1 MG/ML IJ SOLN
0.2500 mg | INTRAMUSCULAR | Status: DC | PRN
  Administered 2011-09-04 (×2): 0.5 mg via INTRAVENOUS

## 2011-09-04 MED ORDER — LIDOCAINE HCL (CARDIAC) 20 MG/ML IV SOLN
INTRAVENOUS | Status: DC | PRN
  Administered 2011-09-04: 50 mg via INTRAVENOUS

## 2011-09-04 MED ORDER — ACETAMINOPHEN 10 MG/ML IV SOLN
INTRAVENOUS | Status: DC | PRN
  Administered 2011-09-04: 1000 mg via INTRAVENOUS

## 2011-09-04 MED ORDER — HYDROMORPHONE HCL PF 1 MG/ML IJ SOLN
INTRAMUSCULAR | Status: DC | PRN
  Administered 2011-09-04 (×2): 1 mg via INTRAVENOUS

## 2011-09-04 MED ORDER — METOCLOPRAMIDE HCL 5 MG/ML IJ SOLN
5.0000 mg | Freq: Three times a day (TID) | INTRAMUSCULAR | Status: DC | PRN
  Administered 2011-09-05: 10 mg via INTRAVENOUS
  Filled 2011-09-04: qty 2

## 2011-09-04 MED ORDER — FENTANYL CITRATE 0.05 MG/ML IJ SOLN
12.5000 ug | INTRAMUSCULAR | Status: DC | PRN
  Administered 2011-09-04: 12.5 ug via INTRAVENOUS
  Administered 2011-09-05: 25 ug via INTRAVENOUS
  Filled 2011-09-04: qty 2

## 2011-09-04 SURGICAL SUPPLY — 53 items
BAG SPEC THK2 15X12 ZIP CLS (MISCELLANEOUS) ×1
BAG ZIPLOCK 12X15 (MISCELLANEOUS) ×2 IMPLANT
BIT DRILL 2.8X128 (BIT) ×2 IMPLANT
BLADE EXTENDED COATED 6.5IN (ELECTRODE) ×2 IMPLANT
BLADE SAW SAG 73X25 THK (BLADE) ×1
BLADE SAW SGTL 73X25 THK (BLADE) ×1 IMPLANT
CLOTH BEACON ORANGE TIMEOUT ST (SAFETY) ×2 IMPLANT
CLSR STERI-STRIP ANTIMIC 1/2X4 (GAUZE/BANDAGES/DRESSINGS) ×2 IMPLANT
DECANTER SPIKE VIAL GLASS SM (MISCELLANEOUS) ×2 IMPLANT
DRAPE INCISE IOBAN 66X45 STRL (DRAPES) ×2 IMPLANT
DRAPE ORTHO SPLIT 77X108 STRL (DRAPES) ×4
DRAPE POUCH INSTRU U-SHP 10X18 (DRAPES) ×2 IMPLANT
DRAPE SURG ORHT 6 SPLT 77X108 (DRAPES) ×2 IMPLANT
DRAPE U-SHAPE 47X51 STRL (DRAPES) ×2 IMPLANT
DRSG ADAPTIC 3X8 NADH LF (GAUZE/BANDAGES/DRESSINGS) ×2 IMPLANT
DRSG MEPILEX BORDER 4X4 (GAUZE/BANDAGES/DRESSINGS) ×2 IMPLANT
DRSG MEPILEX BORDER 4X8 (GAUZE/BANDAGES/DRESSINGS) ×2 IMPLANT
DRSG TEGADERM 4X4.75 (GAUZE/BANDAGES/DRESSINGS) ×1 IMPLANT
DURAPREP 26ML APPLICATOR (WOUND CARE) ×2 IMPLANT
ELECT REM PT RETURN 9FT ADLT (ELECTROSURGICAL) ×2
ELECTRODE REM PT RTRN 9FT ADLT (ELECTROSURGICAL) ×1 IMPLANT
EVACUATOR 1/8 PVC DRAIN (DRAIN) ×2 IMPLANT
FACESHIELD LNG OPTICON STERILE (SAFETY) ×8 IMPLANT
GLOVE BIO SURGEON STRL SZ7.5 (GLOVE) ×2 IMPLANT
GLOVE BIO SURGEON STRL SZ8 (GLOVE) ×2 IMPLANT
GLOVE BIOGEL PI IND STRL 8 (GLOVE) ×2 IMPLANT
GLOVE BIOGEL PI INDICATOR 8 (GLOVE) ×2
GOWN STRL NON-REIN LRG LVL3 (GOWN DISPOSABLE) ×2 IMPLANT
GOWN STRL REIN XL XLG (GOWN DISPOSABLE) ×2 IMPLANT
IMMOBILIZER KNEE 20 (SOFTGOODS) ×2
IMMOBILIZER KNEE 20 THIGH 36 (SOFTGOODS) ×1 IMPLANT
KIT BASIN OR (CUSTOM PROCEDURE TRAY) ×2 IMPLANT
MANIFOLD NEPTUNE II (INSTRUMENTS) ×2 IMPLANT
NDL SAFETY ECLIPSE 18X1.5 (NEEDLE) ×1 IMPLANT
NEEDLE HYPO 18GX1.5 SHARP (NEEDLE) ×2
NS IRRIG 1000ML POUR BTL (IV SOLUTION) ×2 IMPLANT
PACK TOTAL JOINT (CUSTOM PROCEDURE TRAY) ×2 IMPLANT
PASSER SUT SWANSON 36MM LOOP (INSTRUMENTS) ×2 IMPLANT
POSITIONER SURGICAL ARM (MISCELLANEOUS) ×2 IMPLANT
SPONGE GAUZE 4X4 12PLY (GAUZE/BANDAGES/DRESSINGS) ×2 IMPLANT
STRIP CLOSURE SKIN 1/2X4 (GAUZE/BANDAGES/DRESSINGS) ×4 IMPLANT
SUT ETHIBOND NAB CT1 #1 30IN (SUTURE) ×4 IMPLANT
SUT MNCRL AB 4-0 PS2 18 (SUTURE) ×2 IMPLANT
SUT VIC AB 1 CT1 27 (SUTURE) ×4
SUT VIC AB 1 CT1 27XBRD ANTBC (SUTURE) ×2 IMPLANT
SUT VIC AB 2-0 CT1 27 (SUTURE) ×6
SUT VIC AB 2-0 CT1 TAPERPNT 27 (SUTURE) ×3 IMPLANT
SUT VLOC 180 0 24IN GS25 (SUTURE) ×6 IMPLANT
SYR 50ML LL SCALE MARK (SYRINGE) ×2 IMPLANT
TOWEL OR 17X26 10 PK STRL BLUE (TOWEL DISPOSABLE) ×4 IMPLANT
TOWEL OR NON WOVEN STRL DISP B (DISPOSABLE) ×2 IMPLANT
TRAY FOLEY CATH 14FRSI W/METER (CATHETERS) ×2 IMPLANT
WATER STERILE IRR 1500ML POUR (IV SOLUTION) ×2 IMPLANT

## 2011-09-04 NOTE — Transfer of Care (Signed)
Immediate Anesthesia Transfer of Care Note  Patient: Kerri [name]  Procedure(s) Performed: Procedure(s) (LRB): TOTAL HIP ARTHROPLASTY (Left)  Patient Location: PACU  Anesthesia Type: General  Level of Consciousness: awake, alert  and patient cooperative  Airway & Oxygen Therapy: Patient Spontanous Breathing and Patient connected to face mask oxygen  Post-op Assessment: Report given to PACU RN, Post -op Vital signs reviewed and stable and Patient moving all extremities  Post vital signs: Reviewed and stable  Complications: No apparent anesthesia complications

## 2011-09-04 NOTE — Interval H&P Note (Signed)
History and Physical Interval Note:  09/04/2011 5:15 PM  Kerri [name]  has presented today for surgery, with the diagnosis of Osteoarthritis of the Left Hip  The various methods of treatment have been discussed with the patient and family. After consideration of risks, benefits and other options for treatment, the patient has consented to  Procedure(s) (LRB): TOTAL HIP ARTHROPLASTY (Left) as a surgical intervention .  The patients' history has been reviewed, patient examined, no change in status, stable for surgery.  I have reviewed the patients' chart and labs.  Questions were answered to the patient's satisfaction.     Loanne Drilling

## 2011-09-04 NOTE — Progress Notes (Signed)
Pt resting quielty, temp increasing, nurse ok with temp ax. Pt resting quietly.

## 2011-09-04 NOTE — Anesthesia Postprocedure Evaluation (Signed)
  Anesthesia Post-op Note  Patient: Kerri [name]  Procedure(s) Performed: Procedure(s) (LRB): TOTAL HIP ARTHROPLASTY (Left)  Patient Location: PACU  Anesthesia Type: General  Level of Consciousness: awake and alert   Airway and Oxygen Therapy: Patient Spontanous Breathing  Post-op Pain: mild  Post-op Assessment: Post-op Vital signs reviewed, Patient's Cardiovascular Status Stable, Respiratory Function Stable, Patent Airway and No signs of Nausea or vomiting  Post-op Vital Signs: stable  Complications: No apparent anesthesia complications

## 2011-09-04 NOTE — Anesthesia Preprocedure Evaluation (Addendum)
Anesthesia Evaluation  Patient identified by MRN, date of birth, ID band Patient awake    Reviewed: Allergy & Precautions, H&P , NPO status , Patient's Chart, lab work & pertinent test results  Airway Mallampati: II TM Distance: >3 FB Neck ROM: Full    Dental No notable dental hx.    Pulmonary neg pulmonary ROS,  breath sounds clear to auscultation  Pulmonary exam normal       Cardiovascular hypertension, Pt. on medications + CAD (neg stress test 2012) negative cardio ROS  - dysrhythmias Rhythm:Regular Rate:Normal     Neuro/Psych negative neurological ROS  negative psych ROS   GI/Hepatic negative GI ROS, Neg liver ROS, GERD-  Medicated,  Endo/Other  negative endocrine ROSHypothyroidism   Renal/GU negative Renal ROS  negative genitourinary   Musculoskeletal negative musculoskeletal ROS (+)   Abdominal   Peds negative pediatric ROS (+)  Hematology negative hematology ROS (+)   Anesthesia Other Findings   Reproductive/Obstetrics negative OB ROS                         Anesthesia Physical Anesthesia Plan  ASA: III  Anesthesia Plan: General   Post-op Pain Management:    Induction: Intravenous  Airway Management Planned: Oral ETT  Additional Equipment:   Intra-op Plan:   Post-operative Plan: Extubation in OR  Informed Consent: I have reviewed the patients History and Physical, chart, labs and discussed the procedure including the risks, benefits and alternatives for the proposed anesthesia with the patient or authorized representative who has indicated his/her understanding and acceptance.   Dental advisory given  Plan Discussed with: CRNA  Anesthesia Plan Comments:        Anesthesia Quick Evaluation

## 2011-09-04 NOTE — Op Note (Signed)
Pre-operative diagnosis- Osteoarthritis Left hip  Post-operative diagnosis- Osteoarthritis  Left hip  Procedure-  LeftTotal Hip Arthroplasty  Surgeon- Gus Rankin. Wendie Diskin, MD  Assistant- Avel Peace, PA-C   Anesthesia  General  EBL- 300   Drain Hemovac   Complication- None  Condition-PACU - hemodynamically stable.   Brief Clinical Note-  Kerri [name] is a 78 y.o. female with end stage arthritis of her left hip with progressively worsening pain and dysfunction. Pain occurs with activity and rest including pain at night. She has tried analgesics, protected weight bearing and rest without benefit. Pain is too severe to attempt physical therapy. Radiographs demonstrate bone on bone arthritis with subchondral cyst formation. She presents now for left THA.  Procedure in detail-   The patient is brought into the operating room and placed on the operating table. After successful administration of General  anesthesia, the patient is placed in the  Right lateral decubitus position with the  Left side up and held in place with the hip positioner. The lower extremity is isolated from the perineum with plastic drapes and time-out is performed by the surgical team. The lower extremity is then prepped and draped in the usual sterile fashion. A short posterolateral incision is made with a ten blade through the subcutaneous tissue to the level of the fascia lata which is incised in line with the skin incision. The sciatic nerve is palpated and protected and the short external rotators and capsule are isolated from the femur. The hip is then dislocated and the center of the femoral head is marked. A trial prosthesis is placed such that the trial head corresponds to the center of the patients' native femoral head. The resection level is marked on the femoral neck and the resection is made with an oscillating saw. The femoral head is removed and femoral retractors placed to gain access to the femoral canal.  The canal finder is passed into the femoral canal and the canal is thoroughly irrigated with sterile saline to remove the fatty contents. Axial reaming is performed to 15.5  mm, proximal reaming to 51F  and the sleeve machined to a small. A 51F small trial sleeve is placed into the proximal femur.      The femur is then retracted anteriorly to gain acetabular exposure. Acetabular retractors are placed and the labrum and osteophytes are removed, Acetabular reaming is performed to 49  mm and a 50  mm Pinnacle acetabular shell is placed in anatomic position with excellent purchase. Additional dome screws were placed. An apex hole eliminator is placed and the permanent 32 mm neutral + 4 Marathon liner is placed into the acetabular shell.      The trial femur is then placed into the femoral canal. The size is 20 x 15  stem with a 36 + 8  neck and a 32 + 0 head with the neck version matching  the patients' native anteversion. The hip is reduced with excellent stability with full extension and full external rotation, 70 degrees flexion with 40 degrees adduction and 90 degrees internal rotation and 90 degrees of flexion with 70 degrees of internal rotation. The operative leg is placed on top of the non-operative leg and the leg lengths are found to be equal. The trials are then removed and the permanent implant of the same size is impacted into the femoral canal. The  metal femoral head of the same size as the trial is placed and the hip is reduced with the same  stability parameters. The operative leg is again placed on top of the non-operative leg and the leg lengths are found to be equal.      The wound is then copiously irrigated with saline solution and the capsule and short external rotators are re-attached to the femur through drill holes with Ethibond suture. The fascia lata is closed over a hemovac drain with #1 vicryl suture and the fascia lata, gluteal muscles and subcutaneous tissues are injected with Exparel  20ml diluted with saline 50ml. The subcutaneous tissues are closed with #1 and2-0 vicryl and the subcuticular layer closed with running 4-0 Monocryl. The drain is hooked to suction, incision cleaned and dried, and steri-srips and a bulky sterile dressing applied. The limb is placed into a knee immobilizer and the patient is awakened and transported to recovery in stable condition.      Please note that a surgical assistant was a medical necessity for this procedure in order to perform it in a safe and expeditious manner. The assistant was necessary to provide retraction to the vital neurovascular structures and to retract and position the limb to allow for anatomic placement of the prosthetic components.  Gus Rankin Saliou Barnier, MD    09/04/2011, 6:56 PM

## 2011-09-05 DIAGNOSIS — D62 Acute posthemorrhagic anemia: Secondary | ICD-10-CM | POA: Diagnosis not present

## 2011-09-05 LAB — TYPE AND SCREEN
ABO/RH(D): A POS
Antibody Screen: NEGATIVE

## 2011-09-05 LAB — CBC
HCT: 29.7 % — ABNORMAL LOW (ref 36.0–46.0)
Hemoglobin: 9.4 g/dL — ABNORMAL LOW (ref 12.0–15.0)
MCH: 26.6 pg (ref 26.0–34.0)
MCHC: 31.6 g/dL (ref 30.0–36.0)
MCV: 84.1 fL (ref 78.0–100.0)
Platelets: 179 10*3/uL (ref 150–400)
RBC: 3.53 MIL/uL — ABNORMAL LOW (ref 3.87–5.11)
RDW: 14.9 % (ref 11.5–15.5)
WBC: 8.1 10*3/uL (ref 4.0–10.5)

## 2011-09-05 LAB — BASIC METABOLIC PANEL
BUN: 13 mg/dL (ref 6–23)
CO2: 28 mEq/L (ref 19–32)
Calcium: 8.3 mg/dL — ABNORMAL LOW (ref 8.4–10.5)
Chloride: 103 mEq/L (ref 96–112)
Creatinine, Ser: 0.67 mg/dL (ref 0.50–1.10)
GFR calc Af Amer: >90 mL/min (ref >90)
GFR calc non Af Amer: 81 mL/min — ABNORMAL LOW (ref >90)
Glucose, Bld: 152 mg/dL — ABNORMAL HIGH (ref 70–99)
Potassium: 3.7 mEq/L (ref 3.5–5.1)
Sodium: 138 mEq/L (ref 135–145)

## 2011-09-05 MED ORDER — DIAZEPAM 5 MG PO TABS
5.0000 mg | ORAL_TABLET | Freq: Every evening | ORAL | Status: DC | PRN
  Administered 2011-09-05 (×2): 5 mg via ORAL
  Filled 2011-09-05 (×2): qty 1

## 2011-09-05 MED ORDER — ESOMEPRAZOLE MAGNESIUM 40 MG PO CPDR
40.0000 mg | DELAYED_RELEASE_CAPSULE | Freq: Every day | ORAL | Status: DC
Start: 2011-09-05 — End: 2011-09-08
  Administered 2011-09-05 – 2011-09-08 (×4): 40 mg via ORAL
  Filled 2011-09-05 (×4): qty 1

## 2011-09-05 MED ORDER — TAPENTADOL HCL 50 MG PO TABS
50.0000 mg | ORAL_TABLET | ORAL | Status: DC | PRN
  Administered 2011-09-05: 50 mg via ORAL
  Administered 2011-09-05: 100 mg via ORAL
  Administered 2011-09-05: 50 mg via ORAL
  Administered 2011-09-05: 100 mg via ORAL
  Administered 2011-09-06: 50 mg via ORAL
  Administered 2011-09-06 (×2): 100 mg via ORAL
  Administered 2011-09-07 – 2011-09-08 (×6): 50 mg via ORAL
  Filled 2011-09-05 (×2): qty 2
  Filled 2011-09-05: qty 1
  Filled 2011-09-05 (×2): qty 2
  Filled 2011-09-05: qty 1
  Filled 2011-09-05: qty 2
  Filled 2011-09-05: qty 1
  Filled 2011-09-05: qty 2
  Filled 2011-09-05 (×5): qty 1

## 2011-09-05 MED ORDER — POLYSACCHARIDE IRON COMPLEX 150 MG PO CAPS
150.0000 mg | ORAL_CAPSULE | Freq: Every day | ORAL | Status: DC
  Administered 2011-09-05 – 2011-09-07 (×3): 150 mg via ORAL
  Filled 2011-09-05 (×4): qty 1

## 2011-09-05 MED ORDER — DIAZEPAM 5 MG PO TABS: 5.0000 mg | ORAL_TABLET | Freq: Every morning | ORAL | Status: DC

## 2011-09-05 MED ORDER — NON FORMULARY: 40.0000 mg | Freq: Every day | Status: DC

## 2011-09-05 MED ORDER — DIAZEPAM 5 MG PO TABS: 5.0000 mg | ORAL_TABLET | Freq: Every day | ORAL | Status: DC | PRN

## 2011-09-05 NOTE — Progress Notes (Signed)
Utilization review completed.  

## 2011-09-05 NOTE — Evaluation (Signed)
Physical Therapy Evaluation Patient Details Name: Kerri [name] MRN: [id number] DOB: 01/02/1930 Today's Date: 09/05/2011  Problem List:  Patient Active Problem List  Diagnoses  . HYPOTHYROIDISM  . DYSLIPIDEMIA  . HYPERTENSION  . CAD  . OTHER PREMATURE BEATS  . DEGENERATIVE JOINT DISEASE  . SPINAL STENOSIS  . CAROTID BRUIT  . Melanoma of lower leg  . Osteoarthritis of hip  . Postop Acute blood loss anemia    Past Medical History:  Past Medical History  Diagnosis Date  . CAD (coronary artery disease)     Chest CTA 10/07: 40% or less pLAD;  ETT 8/09: negative  . Dyslipidemia   . HTN (hypertension)   . DJD (degenerative joint disease)   . Hypothyroidism   . Melanoma     metastatic; left leg; s/p multiple excisions  . Carotid bruit   . Spinal stenosis   . Chronic back pain   . Osteoporosis   . GERD (gastroesophageal reflux disease)   . IBS (irritable bowel syndrome)    Past Surgical History:  Past Surgical History  Procedure Date  . Melanoma excision   . Knee arthroscopy   . Shoulder surgery   . Carpal tunnel release   . Toe surgery   . Back surgery     PT Assessment/Plan/Recommendation PT Assessment Clinical Impression Statement: Pt with L THR presents with decreased L LE strength/ROM and limited functional mobility. PT Recommendation/Assessment: Patient will need skilled PT in the acute care venue PT Problem List: Decreased strength;Decreased range of motion;Decreased activity tolerance;Decreased balance;Decreased mobility;Decreased knowledge of use of DME;Pain;Decreased knowledge of precautions PT Therapy Diagnosis : Difficulty walking PT Plan PT Frequency: 7X/week PT Treatment/Interventions: DME instruction;Gait training;Functional mobility training;Stair training;Therapeutic exercise;Therapeutic activities;Patient/family education PT Recommendation Recommendations for Other Services: OT consult Follow Up Recommendations: Home health PT;Skilled nursing  facility (dependent on acute stay progress - pt wants home) Equipment Recommended: Rolling walker with 5" wheels PT Goals  Acute Rehab PT Goals PT Goal Formulation: With patient Time For Goal Achievement: 7 days Pt will go Supine/Side to Sit: with min assist PT Goal: Supine/Side to Sit - Progress: Goal set today Pt will go Sit to Supine/Side: with min assist PT Goal: Sit to Supine/Side - Progress: Goal set today Pt will go Sit to Stand: with min assist PT Goal: Sit to Stand - Progress: Goal set today Pt will go Stand to Sit: with min assist PT Goal: Stand to Sit - Progress: Goal set today Pt will Ambulate: 16 - 50 feet;with min assist;with rolling walker PT Goal: Ambulate - Progress: Goal set today Pt will Go Up / Down Stairs: 1-2 stairs;with min assist;with least restrictive assistive device PT Goal: Up/Down Stairs - Progress: Goal set today  PT Evaluation Precautions/Restrictions  Precautions Precautions: Posterior Hip Precaution Comments: sign hung in room Restrictions Weight Bearing Restrictions: Yes LLE Weight Bearing: Partial weight bearing LLE Partial Weight Bearing Percentage or Pounds: 25-50% Prior Functioning  Home Living Lives With: Alone Available Help at Discharge: Family (dtr who  is Charity fundraiser) Type of Home: House Home Access: Stairs to enter Secretary/administrator of Steps: 1 Entrance Stairs-Rails: None Home Layout: One level Home Adaptive Equipment: Environmental consultant - four wheeled Prior Function Level of Independence: Independent with assistive device(s) Able to Take Stairs?: Yes Cognition Cognition Arousal/Alertness: Awake/alert Overall Cognitive Status: Appears within functional limits for tasks assessed Orientation Level: Oriented X4 Cognition - Other Comments: increased anxiety. Sensation/Coordination Coordination Gross Motor Movements are Fluid and Coordinated: Yes Extremity Assessment LUE Assessment LUE Assessment:  Within Functional Limits RLE Assessment RLE  Assessment: Within Functional Limits LLE Assessment LLE Assessment: Exceptions to [facility] (pt tolerated 50 degrees hip flex and very ltd abd) Mobility (including Balance) Bed Mobility Bed Mobility: Yes Supine to Sit: 1: +2 Total assist (pt 40%) Supine to Sit Details (indicate cue type and reason): cues for THP and sequence Sit to Supine: 1: +2 Total assist (pt 40%) Sit to Supine - Details (indicate cue type and reason): cues for THP and sequence Transfers Transfers: No (Pt did not progress to stand 2* c/o dizziness)    Exercise  Total Joint Exercises Ankle Circles/Pumps: AROM;Both;10 reps;Supine Heel Slides: AAROM;10 reps;Supine;Left Hip ABduction/ADduction: Left;AAROM;10 reps;Supine End of Session PT - End of Session Equipment Utilized During Treatment: Gait belt Activity Tolerance: Treatment limited secondary to medical complications (Comment) (c/o increasing dizziness and nausea  in sitting) Patient left: in bed;with call bell in reach Nurse Communication: Mobility status for transfers;Mobility status for ambulation General Behavior During Session: [name] for tasks performed Cognition: [facility] for tasks performed  Kerri [name] 09/05/2011, 1:01 PM

## 2011-09-05 NOTE — Progress Notes (Signed)
Call received from pharmacist stating the fentyl patch may not be the most appropriate method for this patient. Pharmacist informed me that she has spoke to [employer] and they suggested an IV push of fentyl  12.40mcg-25mcg q2H PRN pain. Will continue to monitor patient.

## 2011-09-05 NOTE — Progress Notes (Signed)
Upon arriving to floor, patient appeared lethargic, but arousable. Family stated that the Diludad was making the patient sick, and that she wasn't able to tolerate it. Family also stated that she was able to tolerate Fentyl, and asked about a Fentyl patch.There were no other order for pain meds except the Robaxin. On call PA, christine Strader paged and informed of the issue.  Order to give a fentyl patch, and an order for Bendry 12.5mg  IV. Will continue to monitor patient.

## 2011-09-05 NOTE — Progress Notes (Signed)
Subjective: 1 Day Post-Op Procedure(s) (LRB): TOTAL HIP ARTHROPLASTY (Left) Patient reports pain as moderate and severe.   Patient seen in rounds with Dr. Lequita Halt. Patient has complaints of having a rough day yesterday.  Little better this morning but tough night.  Patient had nausea yesterday after surgery.  Patient and family did not want her to receive Dilaudid since she had nausea yesterday in PACU.  Not sure if from anesthesia or Dilaudid.  Given fentanyl last night but very uncomfortable using on floor and not in stepdown or ICU situation.  Will change to oral pain med over to Nucynta to see if fewer side effects.  Patient appears anxious during visit.  Friend or family member in room. We will start therapy today. Plan is to go home after hospital stay.  Objective: Vital signs in last 24 hours: Temp:  [95.9 F (35.5 C)-98.1 F (36.7 C)] 98 F (36.7 C) (04/16 0529) Pulse Rate:  [62-76] 68  (04/16 0529) Resp:  [10-18] 14  (04/16 0529) BP: (96-159)/(55-84) 102/55 mmHg (04/16 0529) SpO2:  [95 %-100 %] 100 % (04/16 0529) FiO2 (%):  [28 %] 28 % (04/15 2038) Weight:  [59.875 kg (132 lb)] 59.875 kg (132 lb) (04/15 2055)  Intake/Output from previous day:  Intake/Output Summary (Last 24 hours) at 09/05/11 0806 Last data filed at 09/05/11 0600  Gross per 24 hour  Intake   2600 ml  Output   1445 ml  Net   1155 ml    Intake/Output this shift:    Labs:  Basename 09/05/11 0413  HGB 9.4*    Basename 09/05/11 0413  WBC 8.1  RBC 3.53*  HCT 29.7*  PLT 179    Basename 09/05/11 0413  NA 138  K 3.7  CL 103  CO2 28  BUN 13  CREATININE 0.67  GLUCOSE 152*  CALCIUM 8.3*   No results found for this basename: LABPT:2,INR:2 in the last 72 hours  Exam - Neurovascular intact Sensation intact distally Dressing - clean, dry, no drainage Motor function intact - moving foot and toes well on exam.  Hemovac pulled without difficulty.  Past Medical History  Diagnosis Date  . CAD  (coronary artery disease)     Chest CTA 10/07: 40% or less pLAD;  ETT 8/09: negative  . Dyslipidemia   . HTN (hypertension)   . DJD (degenerative joint disease)   . Hypothyroidism   . Melanoma     metastatic; left leg; s/p multiple excisions  . Carotid bruit   . Spinal stenosis   . Chronic back pain   . Osteoporosis   . GERD (gastroesophageal reflux disease)   . IBS (irritable bowel syndrome)     Assessment/Plan: 1 Day Post-Op Procedure(s) (LRB): TOTAL HIP ARTHROPLASTY (Left) Principal Problem:  *Osteoarthritis of hip   Advance diet Up with therapy Continue foley due to strict I&O and urinary output monitoring Discharge home with home health  DVT Prophylaxis - Xarelto Partial-Weight Bearing 25-50% left Leg D/C Knee Immobilizer Hemovac Pulled Begin Therapy Hip Preacutions Keep foley until tomorrow. No vaccines.   [place] 09/05/2011, 8:06 AM

## 2011-09-06 LAB — BASIC METABOLIC PANEL
BUN: 7 mg/dL (ref 6–23)
CO2: 26 mEq/L (ref 19–32)
Calcium: 8.6 mg/dL (ref 8.4–10.5)
Chloride: 103 mEq/L (ref 96–112)
Creatinine, Ser: 0.73 mg/dL (ref 0.50–1.10)
GFR calc Af Amer: >90 mL/min (ref >90)
GFR calc non Af Amer: 79 mL/min — ABNORMAL LOW (ref >90)
Glucose, Bld: 137 mg/dL — ABNORMAL HIGH (ref 70–99)
Potassium: 3.5 mEq/L (ref 3.5–5.1)
Sodium: 139 mEq/L (ref 135–145)

## 2011-09-06 LAB — CBC
HCT: 30.3 % — ABNORMAL LOW (ref 36.0–46.0)
Hemoglobin: 9.4 g/dL — ABNORMAL LOW (ref 12.0–15.0)
MCH: 26 pg (ref 26.0–34.0)
MCHC: 31 g/dL (ref 30.0–36.0)
MCV: 83.9 fL (ref 78.0–100.0)
Platelets: 177 10*3/uL (ref 150–400)
RBC: 3.61 MIL/uL — ABNORMAL LOW (ref 3.87–5.11)
RDW: 15.1 % (ref 11.5–15.5)
WBC: 7.3 10*3/uL (ref 4.0–10.5)

## 2011-09-06 NOTE — Progress Notes (Signed)
CSW met with patient and patients family at bedside. Requesting SNF at [facility]. Referral sent. FL2 completed and placed on chart.  Sharlyn Odonnel C. Ayaan Shutes MSW, LCSW [phone]

## 2011-09-06 NOTE — Progress Notes (Signed)
Subjective: 2 Days Post-Op Procedure(s) (LRB): TOTAL HIP ARTHROPLASTY (Left) Patient reports pain as moderate.   Patient seen in rounds with Dr. Lequita Halt. Patient has complaints of moderate pain.  She did have some nausea yesterday but mostly complains of pain this morning.  Was unable to get up yesterday with therapy. Encouraged by Dr. Lequita Halt to get up today.  She needs to get up and moving. Granddaughter in room with patient.  Continue with current medication treatment plan.  Objective: Vital signs in last 24 hours: Temp:  [97.5 F (36.4 C)-98.6 F (37 C)] 98.6 F (37 C) (04/17 0529) Pulse Rate:  [75-93] 93  (04/17 0529) Resp:  [14-16] 16  (04/17 0529) BP: (97-120)/(59-72) 120/69 mmHg (04/17 0529) SpO2:  [99 %-100 %] 100 % (04/17 0529)  Intake/Output from previous day:  Intake/Output Summary (Last 24 hours) at 09/06/11 1017 Last data filed at 09/06/11 0600  Gross per 24 hour  Intake   1260 ml  Output   1950 ml  Net   -690 ml    Intake/Output this shift:    Labs:  Basename 09/06/11 0415 09/05/11 0413  HGB 9.4* 9.4*    Basename 09/06/11 0415 09/05/11 0413  WBC 7.3 8.1  RBC 3.61* 3.53*  HCT 30.3* 29.7*  PLT 177 179    Basename 09/06/11 0415 09/05/11 0413  NA 139 138  K 3.5 3.7  CL 103 103  CO2 26 28  BUN 7 13  CREATININE 0.73 0.67  GLUCOSE 137* 152*  CALCIUM 8.6 8.3*   No results found for this basename: LABPT:2,INR:2 in the last 72 hours  Exam - Neurovascular intact Sensation intact distally Dressing/Incision - clean, dry, no drainage, healing Motor function intact - moving foot and toes well on exam.   Past Medical History  Diagnosis Date  . CAD (coronary artery disease)     Chest CTA 10/07: 40% or less pLAD;  ETT 8/09: negative  . Dyslipidemia   . HTN (hypertension)   . DJD (degenerative joint disease)   . Hypothyroidism   . Melanoma     metastatic; left leg; s/p multiple excisions  . Carotid bruit   . Spinal stenosis   . Chronic back pain     . Osteoporosis   . GERD (gastroesophageal reflux disease)   . IBS (irritable bowel syndrome)     Assessment/Plan: 2 Days Post-Op Procedure(s) (LRB): TOTAL HIP ARTHROPLASTY (Left) Principal Problem:  *Osteoarthritis of hip Active Problems:  Postop Acute blood loss anemia   Up with therapy Plan for discharge tomorrow Discharge home with home health  DVT Prophylaxis - Xarelto Partial-Weight Bearing 25-50% left Leg  Kerri [name] 09/06/2011, 10:17 AM

## 2011-09-06 NOTE — Progress Notes (Signed)
Physical Therapy Treatment Patient Details Name: Kerri [name] MRN: [id number] DOB: 1930-03-25 Today's Date: 09/06/2011  PT Assessment/Plan  PT - Assessment/Plan Comments on Treatment Session: Pt requiring increased time for all tasks.  Session ltd by inability to follow PWB and c/o dizziness in standing PT Plan: Discharge plan remains appropriate PT Frequency: 7X/week Recommendations for Other Services: OT consult Follow Up Recommendations: Home health PT;Skilled nursing facility Equipment Recommended: None recommended by OT PT Goals  Acute Rehab PT Goals PT Goal Formulation: With patient Time For Goal Achievement: 7 days Pt will go Supine/Side to Sit: with min assist PT Goal: Supine/Side to Sit - Progress: Progressing toward goal Pt will go Sit to Stand: with min assist PT Goal: Sit to Stand - Progress: Progressing toward goal Pt will go Stand to Sit: with min assist PT Goal: Stand to Sit - Progress: Progressing toward goal Pt will Ambulate: 16 - 50 feet;with min assist;with rolling walker PT Goal: Ambulate - Progress: Progressing toward goal  PT Treatment Precautions/Restrictions  Precautions Precautions: Posterior Hip Precaution Comments: pt recalls no THP Restrictions Weight Bearing Restrictions: Yes LLE Weight Bearing: Partial weight bearing LLE Partial Weight Bearing Percentage or Pounds: 25-50 Mobility (including Balance) Transfers Sit to Stand: 1: +2 Total assist;Patient percentage (comment);With upper extremity assist;With armrests;From chair/3-in-1;From bed (Pt 50% ) Sit to Stand Details (indicate cue type and reason): cues for hand placement and LE position Stand to Sit: 1: +2 Total assist;Patient percentage (comment);With armrests;With upper extremity assist;To chair/3-in-1 Stand to Sit Details: cues for hand placement and LE position Ambulation/Gait Ambulation/Gait: Yes Ambulation/Gait Assistance: 1: +2 Total assist Ambulation/Gait Assistance Details (indicate  cue type and reason): cues for sequence, posture, postion from RW, ER on L, and PWB.  Pt with difficutly complying with PWB - did stand pvt on R LE with RW for support to tranfer to chair Ambulation Distance (Feet): 2 Feet (ltd by c/o dizziness) Assistive device: Rolling walker Gait Pattern: Step-to pattern Gait velocity: slow    Exercise    End of Session PT - End of Session Equipment Utilized During Treatment: Gait belt Activity Tolerance: Patient tolerated treatment well;Other (comment) Patient left: in chair Nurse Communication: Mobility status for transfers;Mobility status for ambulation General Behavior During Session: [facility] for tasks performed Cognition: [facility] for tasks performed  Kerri [name] 09/06/2011, 12:41 PM

## 2011-09-06 NOTE — Progress Notes (Signed)
Physical Therapy Treatment Patient Details Name: Kerri [name] MRN: [id number] DOB: [birth year] Today's Date: 09/06/2011  PT Assessment/Plan  PT - Assessment/Plan Comments on Treatment Session: Increased time required for all tasks - pt processing slowly - ? MEDS.  Discussed pt performance with dtr and dtr agrees that follow up in rehab setting would be more appropriate than immediate return home PT Plan: Discharge plan needs to be updated PT Frequency: 7X/week Recommendations for Other Services: OT consult Follow Up Recommendations: Skilled nursing facility PT Goals  Acute Rehab PT Goals Time For Goal Achievement: 7 days Pt will go Sit to Supine/Side: with min assist PT Goal: Sit to Supine/Side - Progress: Not progressing Pt will go Sit to Stand: with min assist PT Goal: Sit to Stand - Progress: Progressing toward goal Pt will go Stand to Sit: with min assist PT Goal: Stand to Sit - Progress: Progressing toward goal Pt will Ambulate: 16 - 50 feet;with min assist;with rolling walker PT Goal: Ambulate - Progress: Progressing toward goal  PT Treatment Precautions/Restrictions  Precautions Precautions: Posterior Hip Precaution Comments: pt recalls no THP Restrictions Weight Bearing Restrictions: Yes LLE Weight Bearing: Partial weight bearing LLE Partial Weight Bearing Percentage or Pounds: 25-50 Mobility (including Balance) Bed Mobility Sit to Supine: 1: +2 Total assist (pt 10 %) Sit to Supine - Details (indicate cue type and reason): Pt limited by fatigue and requiring increased assist to return to bed Transfers Sit to Stand: 1: +2 Total assist;Patient percentage (comment);With upper extremity assist;With armrests;From chair/3-in-1;From bed (pt 50%) Sit to Stand Details (indicate cue type and reason): cues for use of UEs, for LE management and for THP Stand to Sit: 1: +2 Total assist;Patient percentage (comment);With armrests;With upper extremity assist;To chair/3-in-1 (pt  50%) Stand to Sit Details: cues for use of UEs, for LE management and for THP Ambulation/Gait Ambulation/Gait Assistance: 1: +2 Total assist Ambulation/Gait Assistance Details (indicate cue type and reason): cues for posture, sequence, stride length, position from RW, ER on L and increased UE WB.  INcreased time for all aspects of task Ambulation Distance (Feet): 10 Feet (10' x 2) Assistive device: Rolling walker Gait Pattern: Step-to pattern Gait velocity: slow    Exercise    End of Session PT - End of Session Equipment Utilized During Treatment: Gait belt Activity Tolerance: Patient limited by fatigue;Other (comment) Patient left: in bed;with call bell in reach;with family/visitor present Nurse Communication: Mobility status for transfers;Mobility status for ambulation General Behavior During Session: Lethargic Cognition: Impaired (slow to process cues) Cognitive Impairment: pt slow to process cues - pt's dtr present states this is not typical of pt - ? MEDS  Kerri [name] 09/06/2011, 4:19 PM

## 2011-09-06 NOTE — Evaluation (Signed)
Occupational Therapy Evaluation Patient Details Name: Kerri [name] MRN: [id number] DOB: 1929/12/07 Today's Date: 09/06/2011  Problem List:  Patient Active Problem List  Diagnoses  . HYPOTHYROIDISM  . DYSLIPIDEMIA  . HYPERTENSION  . CAD  . OTHER PREMATURE BEATS  . DEGENERATIVE JOINT DISEASE  . SPINAL STENOSIS  . CAROTID BRUIT  . Melanoma of lower leg  . Osteoarthritis of hip  . Postop Acute blood loss anemia    Past Medical History:  Past Medical History  Diagnosis Date  . CAD (coronary artery disease)     Chest CTA 10/07: 40% or less pLAD;  ETT 8/09: negative  . Dyslipidemia   . HTN (hypertension)   . DJD (degenerative joint disease)   . Hypothyroidism   . Melanoma     metastatic; left leg; s/p multiple excisions  . Carotid bruit   . Spinal stenosis   . Chronic back pain   . Osteoporosis   . GERD (gastroesophageal reflux disease)   . IBS (irritable bowel syndrome)    Past Surgical History:  Past Surgical History  Procedure Date  . Melanoma excision   . Knee arthroscopy   . Shoulder surgery   . Carpal tunnel release   . Toe surgery   . Back surgery     OT Assessment/Plan/Recommendation OT Assessment Clinical Impression Statement: Pt moving very slowly POD2 LTHR. Pt wants to return home at d/c but will likely need st snf. Skilled OT recommended to maximize I w/BADLs to min-mod A level in prep for d/c to next venue or home with 24/7 A and HHOT. OT Recommendation/Assessment: Patient will need skilled OT in the acute care venue OT Problem List: Decreased activity tolerance;Decreased safety awareness;Decreased knowledge of use of DME or AE;Impaired balance (sitting and/or standing);Decreased knowledge of precautions;Pain Barriers to Discharge: Decreased caregiver support;Inaccessible home environment OT Therapy Diagnosis : Generalized weakness OT Plan OT Frequency: Min 2X/week OT Treatment/Interventions: Self-care/ADL training;Therapeutic activities;DME and/or AE  instruction;Patient/family education OT Recommendation Follow Up Recommendations: Skilled nursing facility;Supervision/Assistance - 24 hour;Home health OT Equipment Recommended: None recommended by OT Individuals Consulted Consulted and Agree with Results and Recommendations: Patient OT Goals Acute Rehab OT Goals OT Goal Formulation: With patient Time For Goal Achievement: 2 weeks ADL Goals Pt Will Perform Grooming: Sitting, edge of bed;Sitting, chair;Unsupported;with set-up (X 3 tasks to improve activity tolerance.) ADL Goal: Grooming - Progress: Goal set today Pt Will Perform Lower Body Bathing: with min assist;Sit to stand from chair;Sit to stand from bed;with adaptive equipment ADL Goal: Lower Body Bathing - Progress: Goal set today Pt Will Perform Lower Body Dressing: with min assist;with mod assist;Sit to stand from chair;Sit to stand from bed;with adaptive equipment ADL Goal: Lower Body Dressing - Progress: Goal set today Pt Will Transfer to Toilet: with min assist;Stand pivot transfer;3-in-1;Maintaining hip precautions;Ambulation ADL Goal: Toilet Transfer - Progress: Goal set today Pt Will Perform Toileting - Clothing Manipulation: with min assist;Standing ADL Goal: Toileting - Clothing Manipulation - Progress: Goal set today Pt Will Perform Toileting - Hygiene: with min assist;Sit to stand from 3-in-1/toilet ADL Goal: Toileting - Hygiene - Progress: Goal set today  OT Evaluation Precautions/Restrictions  Precautions Precautions: Posterior Hip Precaution Comments: sign hung in room Restrictions Weight Bearing Restrictions: Yes LLE Weight Bearing: Partial weight bearing LLE Partial Weight Bearing Percentage or Pounds: 25-50 Prior Functioning Home Living Lives With: Alone Available Help at Discharge: Family (daughter who is an Charity fundraiser) Type of Home: House Home Access: Stairs to enter [employer] of Steps: 1 Entrance  Stairs-Rails: None Home Layout: One  level Bathroom Shower/Tub:  (no lip. Just straight walk-in) Bathroom Toilet: Handicapped height Bathroom Accessibility: Yes Home Adaptive Equipment: Bedside commode/3-in-1;Walker - four wheeled;Grab bars around toilet;Grab bars in shower;Built-in shower seat;Shower chair with back Prior Function Level of Independence: Independent with assistive device(s) Able to Take Stairs?: Yes  ADL ADL Grooming: Performed;Teeth care;Wash/dry face;Set up Where Assessed - Grooming: Sitting, chair;Unsupported Upper Body Bathing: Simulated;Set up Where Assessed - Upper Body Bathing: Sitting, chair;Unsupported Lower Body Bathing: Simulated;+2 Total assistance;Comment for patient % (Pt ~10%) Where Assessed - Lower Body Bathing: Sit to stand from chair Upper Body Dressing: Simulated;Set up Where Assessed - Upper Body Dressing: Sitting, chair;Unsupported Lower Body Dressing: Simulated;+2 Total assistance;Comment for patient % (Pt ~10%) Where Assessed - Lower Body Dressing: Sit to stand from chair Toilet Transfer: Performed;+2 Total assistance;Comment for patient % (Pt 40 %. Max VCs for technique and hand placement.) Toilet Transfer Method: Surveyor, minerals: Set designer - Clothing Manipulation: Performed;+2 Total assistance;Comment for patient % (Pt ~10%) Where Assessed - Toileting Clothing Manipulation: Sit to stand from 3-in-1 or toilet Toileting - Hygiene: Simulated;+2 Total assistance;Comment for patient % (Pt ~10%) Where Assessed - Toileting Hygiene: Sit to stand from 3-in-1 or toilet Tub/Shower Transfer: Not assessed Tub/Shower Transfer Method: Not assessed Equipment Used: Rolling walker (3:1) ADL Comments: Pt unable to recall any hip precautions when asked X2. Pt moving very slowly. Max time and effort needed for all functional tasks. Vision/Perception    Cognition Cognition Arousal/Alertness: Awake/alert Overall Cognitive Status: Appears within functional  limits for tasks assessed Orientation Level: Oriented X4 Sensation/Coordination   Extremity Assessment RUE Assessment RUE Assessment: Within Functional Limits LUE Assessment LUE Assessment: Within Functional Limits Mobility  Bed Mobility Supine to Sit: 1: +2 Total assist;Patient percentage (comment) (Pt 40%) Supine to Sit Details (indicate cue type and reason): cues for THP, sequencing, hand placement Transfers Transfers: Yes Sit to Stand: 1: +2 Total assist;Patient percentage (comment);With upper extremity assist;With armrests;From chair/3-in-1;From bed (Pt 50% ) Sit to Stand Details (indicate cue type and reason): cues for hand placement and LE position Stand to Sit: 1: +2 Total assist;Patient percentage (comment);With armrests;With upper extremity assist;To chair/3-in-1 Stand to Sit Details: cues for hand placement and LE position Exercises   End of Session OT - End of Session Equipment Utilized During Treatment: Gait belt Activity Tolerance: Patient limited by pain Patient left: in chair;with call bell in reach;with family/visitor present General Behavior During Session: [facility] for tasks performed Cognition: [facility] for tasks performed   Ivyonna Hoelzel A, OTR/L [phone] 09/06/2011, 10:24 AM

## 2011-09-06 NOTE — Progress Notes (Signed)
Physical Therapy Treatment Patient Details Name: Kerri [name] MRN: [id number] DOB: 09-08-1929 Today's Date: 09/06/2011  PT Assessment/Plan  PT - Assessment/Plan Comments on Treatment Session: Pt requiring increased time and +2 assist for all mobility tasks PT Plan: Discharge plan remains appropriate PT Frequency: 7X/week Recommendations for Other Services: OT consult Follow Up Recommendations: Home health PT;Skilled nursing facility Equipment Recommended: None recommended by OT PT Goals  Acute Rehab PT Goals Time For Goal Achievement: 7 days Pt will go Supine/Side to Sit: with min assist PT Goal: Supine/Side to Sit - Progress: Progressing toward goal Pt will go Sit to Stand: with min assist PT Goal: Sit to Stand - Progress: Progressing toward goal Pt will go Stand to Sit: with min assist PT Goal: Stand to Sit - Progress: Progressing toward goal Pt will Ambulate: 16 - 50 feet;with min assist;with rolling walker PT Goal: Ambulate - Progress: Progressing toward goal  PT Treatment Precautions/Restrictions  Precautions Precautions: Posterior Hip Precaution Comments: pt recalls no THP Restrictions Weight Bearing Restrictions: Yes LLE Weight Bearing: Partial weight bearing LLE Partial Weight Bearing Percentage or Pounds: 25-50 Mobility (including Balance) Bed Mobility Supine to Sit: 1: +2 Total assist;Patient percentage (comment) (Pt 40%) Supine to Sit Details (indicate cue type and reason): cues for THP, sequencing, hand placement Transfers Sit to Stand: 1: +2 Total assist;Patient percentage (comment);With upper extremity assist;With armrests;From chair/3-in-1;From bed (Pt 50% ) Sit to Stand Details (indicate cue type and reason): cues for hand placement and LE position Stand to Sit: 1: +2 Total assist;Patient percentage (comment);With armrests;With upper extremity assist;To chair/3-in-1 Stand to Sit Details: cues for hand placement and LE position Ambulation/Gait Ambulation/Gait:  Yes Ambulation/Gait Assistance: 1: +2 Total assist (pt 60%) Ambulation/Gait Assistance Details (indicate cue type and reason): cues for sequence, posture, postion from RW, ER on L, and PWB Ambulation Distance (Feet): 3 Feet Assistive device: Rolling walker Gait Pattern: Step-to pattern Gait velocity: slow    Exercise    End of Session PT - End of Session Equipment Utilized During Treatment: Gait belt Activity Tolerance: Patient tolerated treatment well Patient left: Other (comment) (on comode) Nurse Communication: Mobility status for transfers;Mobility status for ambulation General Behavior During Session: [facility] for tasks performed Cognition: [facility] for tasks performed  Kerri [name] 09/06/2011, 12:33 PM

## 2011-09-07 LAB — CBC
HCT: 26.3 % — ABNORMAL LOW (ref 36.0–46.0)
Hemoglobin: 8.5 g/dL — ABNORMAL LOW (ref 12.0–15.0)
MCH: 26.7 pg (ref 26.0–34.0)
MCHC: 32.3 g/dL (ref 30.0–36.0)
MCV: 82.7 fL (ref 78.0–100.0)
Platelets: 173 10*3/uL (ref 150–400)
RBC: 3.18 MIL/uL — ABNORMAL LOW (ref 3.87–5.11)
RDW: 15.1 % (ref 11.5–15.5)
WBC: 8 10*3/uL (ref 4.0–10.5)

## 2011-09-07 NOTE — Progress Notes (Signed)
Subjective: 3 Days Post-Op Procedure(s) (LRB): TOTAL HIP ARTHROPLASTY (Left) Patient reports pain as moderate.   Patient seen in rounds with Dr. Lequita Halt. Patient has complaints of continued pain.  She is slowly progressing with therapy and not ready to go home despite wanting to go home after the hospital stay.  Dr. Lequita Halt recommending SNF for patient.  Will get social worker involved and possibly transfer to SNF tomorrow.  Objective: Vital signs in last 24 hours: Temp:  [97.8 F (36.6 C)-98.3 F (36.8 C)] 98.3 F (36.8 C) (04/18 0605) Pulse Rate:  [100-111] 111  (04/18 0605) Resp:  [16-20] 20  (04/18 0605) BP: (125-136)/(71-76) 136/72 mmHg (04/18 0605) SpO2:  [93 %-100 %] 93 % (04/18 0605)  Intake/Output from previous day:  Intake/Output Summary (Last 24 hours) at 09/07/11 0851 Last data filed at 09/07/11 1610  Gross per 24 hour  Intake    960 ml  Output    475 ml  Net    485 ml    Intake/Output this shift:    Labs:  Basename 09/07/11 0432 09/06/11 0415 09/05/11 0413  HGB 8.5* 9.4* 9.4*    Basename 09/07/11 0432 09/06/11 0415  WBC 8.0 7.3  RBC 3.18* 3.61*  HCT 26.3* 30.3*  PLT 173 177    Basename 09/06/11 0415 09/05/11 0413  NA 139 138  K 3.5 3.7  CL 103 103  CO2 26 28  BUN 7 13  CREATININE 0.73 0.67  GLUCOSE 137* 152*  CALCIUM 8.6 8.3*   No results found for this basename: LABPT:2,INR:2 in the last 72 hours  Exam - Neurovascular intact Sensation intact distally Dressing/Incision - clean, dry, no drainage, healing Motor function intact - moving foot and toes well on exam.   Past Medical History  Diagnosis Date  . CAD (coronary artery disease)     Chest CTA 10/07: 40% or less pLAD;  ETT 8/09: negative  . Dyslipidemia   . HTN (hypertension)   . DJD (degenerative joint disease)   . Hypothyroidism   . Melanoma     metastatic; left leg; s/p multiple excisions  . Carotid bruit   . Spinal stenosis   . Chronic back pain   . Osteoporosis   . GERD  (gastroesophageal reflux disease)   . IBS (irritable bowel syndrome)     Assessment/Plan: 3 Days Post-Op Procedure(s) (LRB): TOTAL HIP ARTHROPLASTY (Left) Principal Problem:  *Osteoarthritis of hip Active Problems:  Postop Acute blood loss anemia   Up with therapy Plan for discharge tomorrow Discharge to SNF  DVT Prophylaxis - Xarelto Partial-Weight Bearing 25-50% left Leg  Navea Woodrow 09/07/2011, 8:51 AM

## 2011-09-07 NOTE — Progress Notes (Signed)
CARE MANAGEMENT NOTE 09/07/2011  Patient:  Kerri [name], Kerri [name]   Account Number:  [id number]  Date Initiated:  09/07/2011  Documentation initiated by:  Colleen Can  Subjective/Objective Assessment:   DX Osteoarthritis left hip; total hip replacemnt     Action/Plan:   Pt is currently asleep, Spoke with grand daughter who states paln is for patient to go to Skilled facility for rehab.   Anticipated DC Date:  09/08/2011   Anticipated DC Plan:  SKILLED NURSING FACILITY  In-house referral  Clinical Social Worker      DC Planning Services  CM consult      [facility] Choice  NA   Choice offered to / List presented to:  NA   DME arranged  NA      DME agency  NA     HH arranged  NA      HH agency  NA   Status of service:  Completed, signed off Medicare Important Message given?   (If response is "NO", the following Medicare IM given date fields will be blank)   Comments:  09/07/2011 Clinical Social worker following patient; CM signing off.

## 2011-09-07 NOTE — Progress Notes (Signed)
Physical Therapy Treatment Patient Details Name: Kerri [name] MRN: [id number] DOB: Mar 03, 1930 Today's Date: 09/07/2011  PT Assessment/Plan  PT - Assessment/Plan Comments on Treatment Session: Increased time all tasks  PT Plan: Discharge plan remains appropriate PT Frequency: 7X/week Recommendations for Other Services: OT consult Follow Up Recommendations: Skilled nursing facility PT Goals  Acute Rehab PT Goals Time For Goal Achievement: 7 days Pt will go Supine/Side to Sit: with min assist PT Goal: Supine/Side to Sit - Progress: Progressing toward goal Pt will go Sit to Stand: with min assist PT Goal: Sit to Stand - Progress: Progressing toward goal Pt will go Stand to Sit: with min assist PT Goal: Stand to Sit - Progress: Progressing toward goal Pt will Ambulate: 16 - 50 feet;with min assist;with rolling walker PT Goal: Ambulate - Progress: Progressing toward goal Pt will Go Up / Down Stairs: 1-2 stairs;with min assist;with least restrictive assistive device PT Goal: Up/Down Stairs - Progress: Discontinued (comment)  PT Treatment Precautions/Restrictions  Precautions Precautions: Posterior Hip Precaution Comments: pt recalls no THP Restrictions Weight Bearing Restrictions: Yes LLE Weight Bearing: Partial weight bearing LLE Partial Weight Bearing Percentage or Pounds: 25-50 Mobility (including Balance) Transfers Sit to Stand: 1: +2 Total assist;Patient percentage (comment);With upper extremity assist;With armrests;From chair/3-in-1;From bed (pt 60%) Sit to Stand Details (indicate cue type and reason): cues for LE position and use of UEs Stand to Sit: 1: +2 Total assist;Patient percentage (comment);With armrests;With upper extremity assist;To chair/3-in-1 Stand to Sit Details: cues for LE position and use of UEs Ambulation/Gait Ambulation/Gait Assistance: 1: +2 Total assist (pt 65%) Ambulation/Gait Assistance Details (indicate cue type and reason): constant cues for posture,  sequence, position from RW and ER on L Ambulation Distance (Feet): 10 Feet Assistive device: Rolling walker Gait Pattern: Step-to pattern Gait velocity: slow    Exercise    End of Session PT - End of Session Equipment Utilized During Treatment: Gait belt Activity Tolerance: Patient tolerated treatment well Patient left: with call bell in reach;with family/visitor present Nurse Communication: Mobility status for transfers;Mobility status for ambulation General Behavior During Session: [facility] for tasks performed Cognition: [facility] for tasks performed Cognitive Impairment: pt continues slow to process cues but improved from yesterday  Kerri [name] 09/07/2011, 2:20 PM

## 2011-09-07 NOTE — Plan of Care (Signed)
Problem: Consults Goal: Diagnosis- Total Joint Replacement Primary Total Hip LEFT  Problem: Phase III Progression Outcomes Goal: Discharge plan remains appropriate-arrangements made Outcome: Completed/Met Date Met:  09/07/11 [facility]

## 2011-09-07 NOTE — Progress Notes (Signed)
CSW met with pt and provided bed offers. Pt has accepted offer from [facility]. Will continue to follow and assist with d/c to SNF when stable.  Dellie Burns, MSW, LCSWA [phone] (covering)

## 2011-09-07 NOTE — Progress Notes (Signed)
Physical Therapy Treatment Patient Details Name: Kerri [name] MRN: [id number] DOB: [birth year] Today's Date: 09/07/2011  PT Assessment/Plan  PT - Assessment/Plan Comments on Treatment Session: Increased time all tasks  PT Plan: Discharge plan remains appropriate PT Frequency: 7X/week Recommendations for Other Services: OT consult Follow Up Recommendations: Skilled nursing facility PT Goals  Acute Rehab PT Goals Time For Goal Achievement: 7 days Pt will go Supine/Side to Sit: with min assist PT Goal: Supine/Side to Sit - Progress: Progressing toward goal Pt will go Sit to Stand: with min assist PT Goal: Sit to Stand - Progress: Progressing toward goal Pt will go Stand to Sit: with min assist PT Goal: Stand to Sit - Progress: Progressing toward goal Pt will Ambulate: 16 - 50 feet;with min assist;with rolling walker PT Goal: Ambulate - Progress: Progressing toward goal Pt will Go Up / Down Stairs: 1-2 stairs;with min assist;with least restrictive assistive device PT Goal: Up/Down Stairs - Progress: Discontinued (comment) (pt plans SNF d/c)  PT Treatment Precautions/Restrictions  Precautions Precautions: Posterior Hip Precaution Comments: pt recalls no THP Restrictions Weight Bearing Restrictions: Yes LLE Weight Bearing: Partial weight bearing LLE Partial Weight Bearing Percentage or Pounds: 25-50 Mobility (including Balance) Bed Mobility Supine to Sit: 1: +2 Total assist;Patient percentage (comment) (pt 20%) Supine to Sit Details (indicate cue type and reason): cues for sequence and encouragement to self assist Transfers Sit to Stand: 1: +2 Total assist;Patient percentage (comment);With upper extremity assist;With armrests;From chair/3-in-1;From bed (pt 50%) Sit to Stand Details (indicate cue type and reason): cues for LE position and use of UEs Stand to Sit: 1: +2 Total assist;Patient percentage (comment);With armrests;With upper extremity assist;To chair/3-in-1 Stand to Sit  Details: cues for THP, LE position and use of UEs Ambulation/Gait Ambulation/Gait Assistance: 1: +2 Total assist (pt 60%) Ambulation/Gait Assistance Details (indicate cue type and reason): constant cues for posture, sequence, position from RW and ER on L Ambulation Distance (Feet): 23 Feet (23 and 5') Assistive device: Rolling walker Gait Pattern: Step-to pattern Gait velocity: slow    Exercise    End of Session PT - End of Session Equipment Utilized During Treatment: Gait belt Activity Tolerance: Patient tolerated treatment well Patient left: in chair;with call bell in reach;with family/visitor present Nurse Communication: Mobility status for transfers;Mobility status for ambulation General Behavior During Session: [facility] for tasks performed Cognition: [facility] for tasks performed Cognitive Impairment: pt continues slow to process cues but improved from yesterday  Ashaun Gaughan 09/07/2011, 2:16 PM

## 2011-09-07 NOTE — Progress Notes (Signed)
Physical Therapy Treatment Patient Details Name: Kerri [name] MRN: [id number] DOB: 1929/10/15 Today's Date: 09/07/2011  PT Assessment/Plan  PT - Assessment/Plan Comments on Treatment Session: Increased time for all tasks.  Continued improvement with pt ability to follow cues PT Plan: Discharge plan remains appropriate PT Frequency: 7X/week Recommendations for Other Services: OT consult Follow Up Recommendations: Skilled nursing facility Equipment Recommended: None recommended by OT PT Goals  Acute Rehab PT Goals PT Goal Formulation: With patient Time For Goal Achievement: 7 days Pt will go Supine/Side to Sit: with min assist PT Goal: Supine/Side to Sit - Progress: Progressing toward goal Pt will go Sit to Supine/Side: with min assist PT Goal: Sit to Supine/Side - Progress: Progressing toward goal Pt will go Sit to Stand: with min assist PT Goal: Sit to Stand - Progress: Progressing toward goal Pt will go Stand to Sit: with min assist PT Goal: Stand to Sit - Progress: Progressing toward goal Pt will Ambulate: 16 - 50 feet;with min assist;with rolling walker PT Goal: Ambulate - Progress: Progressing toward goal Pt will Go Up / Down Stairs: 1-2 stairs;with min assist;with least restrictive assistive device PT Goal: Up/Down Stairs - Progress: Discontinued (comment)  PT Treatment Precautions/Restrictions  Precautions Precautions: Posterior Hip Precaution Comments: pt recalls no THP Restrictions Weight Bearing Restrictions: Yes LLE Weight Bearing: Partial weight bearing LLE Partial Weight Bearing Percentage or Pounds: 25-50 Mobility (including Balance) Bed Mobility Supine to Sit: 1: +2 Total assist;Patient percentage (comment) (pt 20%) Supine to Sit Details (indicate cue type and reason): cues for sequence and encouragement to self assist Sit to Supine: 1: +2 Total assist (pt 40% with increased time ) Sit to Supine - Details (indicate cue type and reason): multimodal cues for  sequence, to self assist and to adhere to THP Transfers Sit to Stand: 3: Mod assist Sit to Stand Details (indicate cue type and reason): cues for LE position and use of UEs Stand to Sit: 1: +2 Total assist;Patient percentage (comment);With armrests;With upper extremity assist;To chair/3-in-1 (pt 70 %) Stand to Sit Details: cues for LE position and use of UEs Ambulation/Gait Ambulation/Gait Assistance: 3: Mod assist Ambulation/Gait Assistance Details (indicate cue type and reason): constant cues for posture, sequence, position from RW and ER on L Ambulation Distance (Feet): 19 Feet Assistive device: Rolling walker Gait Pattern: Step-to pattern Gait velocity: slow    Exercise    End of Session PT - End of Session Equipment Utilized During Treatment: Gait belt Activity Tolerance: Patient tolerated treatment well Patient left: in bed;with call bell in reach;with family/visitor present Nurse Communication: Mobility status for transfers;Mobility status for ambulation General Behavior During Session: [facility] for tasks performed Cognition: [facility] for tasks performed Cognitive Impairment: pt continues slow to process cues but improved from yesterday  Zhane Donlan 09/07/2011, 2:29 PM

## 2011-09-07 NOTE — Progress Notes (Signed)
Patient was having difficulty emptying bladder since foley was discontinued at 1600 09/06/11. Bladder scans revealed at 0600 - 157cc and 1200- 350cc. Several attempts to void were unsuccessful to produce any urine. Paged the PA and received verbal order to do an in-and-out catheter (to leave in if outcome yielded more than 300cc). Fortunately, patient was able to try one more time on the commode as was able to urinate a total of 250cc.  Will hold off on inserting catheter, but will continue to monitor urine output and report any difficulties.  Benay Spice RN

## 2011-09-08 DIAGNOSIS — E876 Hypokalemia: Secondary | ICD-10-CM | POA: Diagnosis not present

## 2011-09-08 LAB — BASIC METABOLIC PANEL
BUN: 10 mg/dL (ref 6–23)
CO2: 30 mEq/L (ref 19–32)
Calcium: 8.4 mg/dL (ref 8.4–10.5)
Chloride: 98 mEq/L (ref 96–112)
Creatinine, Ser: 0.67 mg/dL (ref 0.50–1.10)
GFR calc Af Amer: >90 mL/min (ref >90)
GFR calc non Af Amer: 81 mL/min — ABNORMAL LOW (ref >90)
Glucose, Bld: 137 mg/dL — ABNORMAL HIGH (ref 70–99)
Potassium: 2.8 mEq/L — ABNORMAL LOW (ref 3.5–5.1)
Sodium: 137 mEq/L (ref 135–145)

## 2011-09-08 LAB — CBC
HCT: 25 % — ABNORMAL LOW (ref 36.0–46.0)
Hemoglobin: 8 g/dL — ABNORMAL LOW (ref 12.0–15.0)
MCH: 26.5 pg (ref 26.0–34.0)
MCHC: 32 g/dL (ref 30.0–36.0)
MCV: 82.8 fL (ref 78.0–100.0)
Platelets: 180 10*3/uL (ref 150–400)
RBC: 3.02 MIL/uL — ABNORMAL LOW (ref 3.87–5.11)
RDW: 15.1 % (ref 11.5–15.5)
WBC: 6.4 10*3/uL (ref 4.0–10.5)

## 2011-09-08 MED ORDER — ACETAMINOPHEN 325 MG PO TABS: 650.0000 mg | ORAL_TABLET | Freq: Four times a day (QID) | ORAL | Status: DC | PRN

## 2011-09-08 MED ORDER — RIVAROXABAN 10 MG PO TABS: 10.0000 mg | ORAL_TABLET | Freq: Every day | ORAL | Status: DC

## 2011-09-08 MED ORDER — DSS 100 MG PO CAPS: 100.0000 mg | ORAL_CAPSULE | Freq: Two times a day (BID) | ORAL | Status: AC

## 2011-09-08 MED ORDER — POLYSACCHARIDE IRON COMPLEX 150 MG PO CAPS
150.0000 mg | ORAL_CAPSULE | Freq: Two times a day (BID) | ORAL | Status: DC
  Filled 2011-09-08: qty 1

## 2011-09-08 MED ORDER — POTASSIUM CHLORIDE CRYS ER 20 MEQ PO TBCR
40.0000 meq | EXTENDED_RELEASE_TABLET | ORAL | Status: AC
  Administered 2011-09-08 (×2): 40 meq via ORAL
  Filled 2011-09-08 (×2): qty 2

## 2011-09-08 MED ORDER — ONDANSETRON HCL 4 MG PO TABS: 4.0000 mg | ORAL_TABLET | Freq: Four times a day (QID) | ORAL | Status: AC | PRN

## 2011-09-08 MED ORDER — DIAZEPAM 5 MG PO TABS: 5.0000 mg | ORAL_TABLET | Freq: Two times a day (BID) | ORAL | Status: AC | PRN

## 2011-09-08 MED ORDER — POLYSACCHARIDE IRON COMPLEX 150 MG PO CAPS: 150.0000 mg | ORAL_CAPSULE | Freq: Two times a day (BID) | ORAL | Status: DC

## 2011-09-08 MED ORDER — METHOCARBAMOL 500 MG PO TABS: 500.0000 mg | ORAL_TABLET | Freq: Four times a day (QID) | ORAL | Status: AC | PRN

## 2011-09-08 MED ORDER — BISACODYL 10 MG RE SUPP: 10.0000 mg | Freq: Every day | RECTAL | Status: AC | PRN

## 2011-09-08 MED ORDER — TAPENTADOL HCL 50 MG PO TABS: 50.0000 mg | ORAL_TABLET | ORAL | Status: DC | PRN

## 2011-09-08 NOTE — Progress Notes (Signed)
Pt stable and ready for d/c. Iv site was removed and pressure dressing was placed over site.  Ambulance transport is here and is taking pt to snf at this time.

## 2011-09-08 NOTE — Progress Notes (Signed)
Subjective: 4 Days Post-Op Procedure(s) (LRB): TOTAL HIP ARTHROPLASTY (Left) Patient reports pain as mild and moderate.   Patient seen in rounds with Dr. Lequita Halt.  Felt ready for transfer over to facility. Patient still with pain but starting to slowly improve with therapy.  Plan is to go to [facility] today.  Objective: Vital signs in last 24 hours: Temp:  [98.5 F (36.9 C)-100 F (37.8 C)] 98.5 F (36.9 C) (04/19 0634) Pulse Rate:  [96-102] 97  (04/19 0634) Resp:  [16-18] 16  (04/19 0634) BP: (115-131)/(65-78) 122/68 mmHg (04/19 0634) SpO2:  [90 %-95 %] 90 % (04/19 0634)  Intake/Output from previous day:  Intake/Output Summary (Last 24 hours) at 09/08/11 0756 Last data filed at 09/08/11 1191  Gross per 24 hour  Intake    360 ml  Output    250 ml  Net    110 ml    Intake/Output this shift:    Labs:  Basename 09/08/11 0420 09/07/11 0432 09/06/11 0415  HGB 8.0* 8.5* 9.4*    Basename 09/08/11 0420 09/07/11 0432  WBC 6.4 8.0  RBC 3.02* 3.18*  HCT 25.0* 26.3*  PLT 180 173    Basename 09/08/11 0420 09/06/11 0415  NA 137 139  K 2.8* 3.5  CL 98 103  CO2 30 26  BUN 10 7  CREATININE 0.67 0.73  GLUCOSE 137* 137*  CALCIUM 8.4 8.6   No results found for this basename: LABPT:2,INR:2 in the last 72 hours  Exam: Neurovascular intact Sensation intact distally Incision - clean, dry, no drainage, healing Motor function intact - moving foot and toes well on exam.   Assessment/Plan: 4 Days Post-Op Procedure(s) (LRB): TOTAL HIP ARTHROPLASTY (Left) Procedure(s) (LRB): TOTAL HIP ARTHROPLASTY (Left) Past Medical History  Diagnosis Date  . CAD (coronary artery disease)     Chest CTA 10/07: 40% or less pLAD;  ETT 8/09: negative  . Dyslipidemia   . HTN (hypertension)   . DJD (degenerative joint disease)   . Hypothyroidism   . Melanoma     metastatic; left leg; s/p multiple excisions  . Carotid bruit   . Spinal stenosis   . Chronic back pain   . Osteoporosis   . GERD  (gastroesophageal reflux disease)   . IBS (irritable bowel syndrome)    Principal Problem:  *Osteoarthritis of hip Active Problems:  Postop Acute blood loss anemia  Postop Hypokalemia   Up with therapy Discharge to SNF Diet - heart healthy Follow up - in 2 weeks Activity - PWB Disposition - Skilled nursing facility Condition Upon Discharge - Stable D/C Meds - See DC Summary DVT Prophylaxis - Xarelto  Marah Park 09/08/2011, 7:56 AM

## 2011-09-08 NOTE — Progress Notes (Signed)
Patient cleared for discharge. Patient accepted at [facility]. Packet copied and placed in [place]. [facility] called for transportation. Daughter at bedside informed.  Yoshiaki Kreuser C. Kamryn Messineo MSW, LCSW [phone]

## 2011-09-08 NOTE — Discharge Instructions (Signed)
Hip Rehabilitation, Guidelines Following Surgery The results of a hip operation are greatly improved after range of motion and muscle strengthening exercises. Follow all safety measures which are given to protect your hip. If any of these exercises cause increased pain or swelling in your joint, decrease the amount until you are comfortable again. Then slowly increase the exercises. Call your caregiver if you have problems or questions. HOME CARE INSTRUCTIONS  Most of the following instructions are designed to prevent the dislocation of your new hip.  Do not put on socks or shoes without following the instructions of your caregivers.   Sit on high chairs so your hips are not bent more than 90 degrees.   Sit on chairs with arms. Use the chair arms to help push yourself up when arising.   Keep your leg on the side of the operation out in front of you when standing up.   Arrange for the use of a toilet seat elevator so you are not sitting low.   Do not do any exercises or get in any positions that cause your toes to point in (pigeon toed).   Always sleep with a pillow between your legs. Do not lie on your side in sleep with both knees touching the bed.   You may resume a sexual relationship in one month or when given the OK by your caregiver.   Use crutches or walker as long as suggested by your caregivers.   Begin weight bearing with your caregiver's approval.   Avoid periods of inactivity such as sitting longer than an hour when not asleep. This helps prevent blood clots.   Return to work as instructed by your caregiver.   Do not drive a car for 6 weeks or as instructed.   Do not drive while taking narcotics.   Wear elastic stockings until instructed not to.   Make sure you keep all of your appointments after your operation with all of your doctors and caregivers.  RANGE OF MOTION AND STRENGTHENING EXERCISES These exercises are designed to help you keep full movement of your hip  joint. Follow your caregiver's or physical therapist's instructions. Perform all exercises about fifteen times, three times per day or as directed. Exercise both hips, even if you have had only one joint replacement. These exercises can be done on a training (exercise) mat, on the floor, on a table or on a bed. Use whatever works the best and is most comfortable for you. Use music or television while you are exercising so that the exercises are a pleasant break in your day. This will make your life better with the exercises acting as a break in routine you can look forward to.  Lying on your back, slowly slide your foot toward your buttocks, raising your knee up off the floor. Then slowly slide your foot back down until your leg is straight again.   Lying on your back spread your legs as far apart as you can without causing discomfort.   Lying on your side, raise your upper leg and foot straight up from the floor as far as is comfortable. Slowly lower the leg and repeat.   Lying on your back, tighten up the muscle in the front of your thigh (quadriceps muscles). You can do this by keeping your leg straight and trying to raise your heel off the floor. This helps strengthen the largest muscle supporting your knee.   Lying on your back, tighten up the muscles of your buttocks both  with the legs straight and with the knee bent at a comfortable angle while keeping your heel on the floor. Document Released: 12/10/2003 Document Revised: 04/27/2011 Document Reviewed: 11/27/2007 [facility] Patient Information 2012 [place], [place].  Pick up stool softner and laxative for home. Do not submerge incision under water. May shower. Continue to use ice for pain and swelling from surgery. Hip precautions.  Total Hip Protocol.  When discharged from the skilled rehab facility, please have the facility set up the patient's Home Health Physical Therapy prior to being released.  Also provide the patient with their  medications at time of release from the facility to include their pain medication, the muscle relaxants, and their blood thinner medication.  If the patient is still at the rehab facility at time of follow up appointment, please also assist the patient in arranging follow up appointment in our office and any transportation needs.

## 2011-09-08 NOTE — Discharge Summary (Signed)
Physician Discharge Summary   Patient ID: Kerri [name] MRN: [id number] DOB/AGE: [birth year] 78 y.o.  Admit date: 09/04/2011 Discharge date: 09/08/2011  Primary Diagnosis: Osteoarthritis Left Hip    Admission Diagnoses:  Past Medical History  Diagnosis Date  . CAD (coronary artery disease)     Chest CTA 10/07: 40% or less pLAD;  ETT 8/09: negative  . Dyslipidemia   . HTN (hypertension)   . DJD (degenerative joint disease)   . Hypothyroidism   . Melanoma     metastatic; left leg; s/p multiple excisions  . Carotid bruit   . Spinal stenosis   . Chronic back pain   . Osteoporosis   . GERD (gastroesophageal reflux disease)   . IBS (irritable bowel syndrome)    Discharge Diagnoses:   Principal Problem:  *Osteoarthritis of hip Active Problems:  Postop Acute blood loss anemia  Postop Hypokalemia  Procedure: Procedure(s) (LRB): TOTAL HIP ARTHROPLASTY (Left)   Consults: None  HPI: Kerri [name] is a 78 y.o. female with end stage arthritis of her left hip with progressively worsening pain and dysfunction. Pain occurs with activity and rest including pain at night. She has tried analgesics, protected weight bearing and rest without benefit. Pain is too severe to attempt physical therapy. Radiographs demonstrate bone on bone arthritis with subchondral cyst formation. She presents now for left THA.  Laboratory Data: Hospital Outpatient Visit on 09/01/2011  Component Date Value Range Status  . ABO/RH(D)  09/01/2011 A POS   Final    Basename 09/08/11 0420 09/07/11 0432 09/06/11 0415  HGB 8.0* 8.5* 9.4*    Basename 09/08/11 0420 09/07/11 0432  WBC 6.4 8.0  RBC 3.02* 3.18*  HCT 25.0* 26.3*  PLT 180 173    Basename 09/08/11 0420 09/06/11 0415  NA 137 139  K 2.8* 3.5  CL 98 103  CO2 30 26  BUN 10 7  CREATININE 0.67 0.73  GLUCOSE 137* 137*  CALCIUM 8.4 8.6   No results found for this basename: LABPT:2,INR:2 in the last 72 hours  X-Rays:Dg Hip Complete Left  09/01/2011   *RADIOLOGY REPORT*  Clinical Data: 78 year old female preoperative study for left total hip replacement.  LEFT HIP - COMPLETE 2+ VIEW  Comparison: Hip MRI 07/21/2011.  Findings: Joint space loss at both hips, worse on the left. Subchondral sclerosis and geode formation, worse on the left. Surgical clips about the left inguinal region.  Proximal left femur is intact.  Pelvis appears intact. Visualized bowel gas pattern is nonobstructed.  IMPRESSION: Left greater than right hip osteoarthritis.  Original Report Authenticated By: Harley Hallmark, M.D.   Dg Pelvis Portable  09/04/2011  *RADIOLOGY REPORT*  Clinical Data: Status post left hip replacement.  PORTABLE PELVIS  Comparison: MRI of the left hip 07/21/2011.  Findings: The patient has a new left total hip arthroplasty. Surgical drain and staples are noted.  The device is located.  No fracture.  Distal aspect of the femoral stem is off the margin of the film.  Right hip degenerative change noted.  IMPRESSION: Left total hip without evidence of complication.  Original Report Authenticated By: Bernadene Bell. D'ALESSIO, M.D.   Dg Hip Portable 1 View Left  09/04/2011  *RADIOLOGY REPORT*  Clinical Data: Status post left hip replacement.  PORTABLE LEFT HIP - 1 VIEW  Comparison: Plain films 09/01/2011.  Findings: Left total hip replacement is now in place.  The device is located.  There is no fracture.  Surgical drain noted.  IMPRESSION: Left total hip  without evidence of complication.  Original Report Authenticated By: Bernadene Bell. D'ALESSIO, M.D.    EKG: Orders placed in visit on 09/01/11  . EKG 12-LEAD     Hospital Course: Patient was admitted to [facility] [address] and taken to the OR and underwent the above state procedure without complications.  Patient tolerated the procedure well and was later transferred to the recovery room and then to the orthopaedic floor for postoperative care.  They were given PO and IV analgesics for pain control following their  surgery.  They were given 24 hours of postoperative antibiotics and started on DVT prophylaxis in the form of Xarelto.   PT and OT were ordered for total hip protocol.  The patient was allowed to be PWB with therapy. Discharge planning was consulted to help with postop disposition and equipment needs.  Patient had a rough night on the evening of surgery due to pain and nausea and was not able to get up OOB with therapy on day one.  Hemovac drain was pulled without difficulty.  The knee immobilizer was removed and discontinued. Patient had nausea yesterday after surgery. Patient and family did not want her to receive Dilaudid since she had nausea yesterday in PACU. Not sure if from anesthesia or Dilaudid. Given fentanyl last night but very uncomfortable using on floor and not in stepdown or ICU situation. Changed to oral pain med over to Nucynta to see if fewer side effects. Patient appears anxious during visit. Friend or family member in room.  Continued to try and work with therapy into day two but was only able to get up about three feet.  Dressing was changed on day two and the incision was healing well.  By day three, the patient had was slowly progressing with therapy and not ready to go home despite wanting to go home after the hospital stay. Dr. Lequita Halt recommending SNF for patient. Got social worker involved and possibly transfer to SNF tomorrow.  Did a little better getting up about 20 feet with therapy on day three.  She was seen on day four and a bed was found at [place].  She still wanted to go home but it was explained to patient that she needed more therapy and would benefit from rehab at [facility].  Patient in agreement after talking with Dr. Lequita Halt and plans to go today.   Discharge Medications: Prior to Admission medications   Medication Sig Start Date End Date Taking? Authorizing Provider  esomeprazole (NEXIUM) 40 MG capsule Take 40 mg by mouth daily before breakfast.    Yes Historical Provider,  MD  hydrochlorothiazide (MICROZIDE) 12.5 MG capsule Take 12.5 mg by mouth daily after breakfast.  04/11/11  Yes Historical Provider, MD  levothyroxine (SYNTHROID, LEVOTHROID) 75 MCG tablet Take 75 mcg by mouth daily with breakfast.   Yes Historical Provider, MD  loratadine (CLARITIN) 10 MG tablet Take 10 mg by mouth daily.    Yes Historical Provider, MD  Magnesium 500 MG TABS Take 500 mg by mouth daily.   Yes Historical Provider, MD  OLUX-E 0.05 % topical foam Apply 1 application topically 2 (two) times daily as needed. Itching  05/02/11  Yes Historical Provider, MD  polyethylene glycol powder (GLYCOLAX/MIRALAX) powder Take 17 g by mouth daily.   Yes Historical Provider, MD  rosuvastatin (CRESTOR) 10 MG tablet Take 10 mg by mouth at bedtime.    Yes Historical Provider, MD  acetaminophen (TYLENOL) 325 MG tablet Take 2 tablets (650 mg total) by mouth every  6 (six) hours as needed (or Fever >/= 101). 09/08/11 09/07/12  Charday Capetillo Julien Girt, PA  bisacodyl (DULCOLAX) 10 MG suppository Place 1 suppository (10 mg total) rectally daily as needed. 09/08/11 09/18/11  Darren Nodal, PA  diazepam (VALIUM) 5 MG tablet Take 1 tablet (5 mg total) by mouth every 12 (twelve) hours as needed for anxiety or sleep (or am therapy). 09/08/11 09/18/11  Rosemary Pentecost, PA  docusate sodium 100 MG CAPS Take 100 mg by mouth 2 (two) times daily. 09/08/11 09/18/11  Meaghen Vecchiarelli, PA  iron polysaccharides (NIFEREX) 150 MG capsule Take 1 capsule (150 mg total) by mouth 2 (two) times daily. Take for three weeks and then discontinue. 09/08/11 09/07/12  Keyshaun Exley, PA  LIDODERM 5 % Place 1 patch onto the skin daily as needed. Pain  04/01/11   Historical Provider, MD  methocarbamol (ROBAXIN) 500 MG tablet Take 1 tablet (500 mg total) by mouth every 6 (six) hours as needed. 09/08/11 09/18/11  Dulse Rutan, PA  ondansetron (ZOFRAN) 4 MG tablet Take 1 tablet (4 mg total) by mouth every 6 (six) hours as needed for  nausea. 09/08/11 09/15/11  Sariyah Corcino Julien Girt, PA  rivaroxaban (XARELTO) 10 MG TABS tablet Take 1 tablet (10 mg total) by mouth daily with breakfast. Take for two and a half more weeks, then discontinue Xarelto. 09/08/11   Sylvanna Burggraf Julien Girt, PA  tapentadol (NUCYNTA) 50 MG TABS Take 1-2 tablets (50-100 mg total) by mouth every 4 (four) hours as needed. 09/08/11   Tawona Filsinger Julien Girt, PA    Diet: heart healthy Activity:PWB No bending hip over 90 degrees- A "L" Angle Do not cross legs Do not let foot roll inward When turning these patients a pillow should be placed between the patient's legs to prevent crossing. Patients should have the affected knee fully extended when trying to sit or stand from all surfaces to prevent excessive hip flexion. When ambulating and turning toward the affected side the affected leg should have the toes turned out prior to moving the walker and the rest of patient's body as to prevent internal rotation/ turning in of the leg. Abduction pillows are the most effective way to prevent a patient from not crossing legs or turning toes in at rest. If an abduction pillow is not ordered placing a regular pillow length wise between the patient's legs is also an effective reminder. It is imperative that these precautions be maintained so that the surgical hip does not dislocate. Follow-up:in 2 weeks Disposition - Skilled nursing facility Discharged Condition: stable  **Please recheck a CBC and BMET on Monday for follow up on labs**  Discharge Orders    Future Orders Please Complete By Expires   Diet - low sodium heart healthy      Call MD / Call 911      Comments:   If you experience chest pain or shortness of breath, CALL 911 and be transported to the hospital emergency room.  If you develope a fever above 101 F, pus (white drainage) or increased drainage or redness at the wound, or calf pain, call your surgeon's office.   Constipation Prevention      Comments:   Drink  plenty of fluids.  Prune juice may be helpful.  You may use a stool softener, such as Colace (over the counter) 100 mg twice a day.  Use MiraLax (over the counter) for constipation as needed.   Increase activity slowly as tolerated      Weight Bearing as taught in Physical Therapy  Comments:   Use a walker or crutches as instructed.   Discharge instructions      Comments:   Pick up stool softner and laxative for home. Do not submerge incision under water. May shower. Continue to use ice for pain and swelling from surgery. Hip precautions.  Total Hip Protocol.  When discharged from the skilled rehab facility, please have the facility set up the patient's Home Health Physical Therapy prior to being released.  Also provide the patient with their medications at time of release from the facility to include their pain medication, the muscle relaxants, and their blood thinner medication.  If the patient is still at the rehab facility at time of follow up appointment, please also assist the patient in arranging follow up appointment in our office and any transportation needs.    Driving restrictions      Comments:   No driving   Lifting restrictions      Comments:   No lifting   Follow the hip precautions as taught in Physical Therapy      Change dressing      Comments:   You may change your dressing daily with sterile 4 x 4 inch gauze dressing and paper tape.   TED hose      Comments:   Use stockings (TED hose) for 3 weeks on both leg(s).  You may remove them at night for sleeping.     Medication List  As of 09/08/2011  8:11 AM   STOP taking these medications         naproxen sodium 220 MG tablet      Vitamin D 2000 UNITS Caps         TAKE these medications         acetaminophen 325 MG tablet   Commonly known as: TYLENOL   Take 2 tablets (650 mg total) by mouth every 6 (six) hours as needed (or Fever >/= 101).      bisacodyl 10 MG suppository   Commonly known as: DULCOLAX     Place 1 suppository (10 mg total) rectally daily as needed.      diazepam 5 MG tablet   Commonly known as: VALIUM   Take 1 tablet (5 mg total) by mouth every 12 (twelve) hours as needed for anxiety or sleep (or am therapy).      DSS 100 MG Caps   Take 100 mg by mouth 2 (two) times daily.      esomeprazole 40 MG capsule   Commonly known as: NEXIUM   Take 40 mg by mouth daily before breakfast.      hydrochlorothiazide 12.5 MG capsule   Commonly known as: MICROZIDE   Take 12.5 mg by mouth daily after breakfast.      iron polysaccharides 150 MG capsule   Commonly known as: NIFEREX   Take 1 capsule (150 mg total) by mouth 2 (two) times daily. Take for three weeks and then discontinue.      levothyroxine 75 MCG tablet   Commonly known as: SYNTHROID, LEVOTHROID   Take 75 mcg by mouth daily with breakfast.      LIDODERM 5 %   Generic drug: lidocaine   Place 1 patch onto the skin daily as needed. Pain        loratadine 10 MG tablet   Commonly known as: CLARITIN   Take 10 mg by mouth daily.      Magnesium 500 MG Tabs   Take 500 mg by mouth daily.  methocarbamol 500 MG tablet   Commonly known as: ROBAXIN   Take 1 tablet (500 mg total) by mouth every 6 (six) hours as needed.      OLUX-E 0.05 % topical foam   Generic drug: Clobetasol Propionate Emulsion   Apply 1 application topically 2 (two) times daily as needed. Itching        ondansetron 4 MG tablet   Commonly known as: ZOFRAN   Take 1 tablet (4 mg total) by mouth every 6 (six) hours as needed for nausea.      polyethylene glycol powder powder   Commonly known as: GLYCOLAX/MIRALAX   Take 17 g by mouth daily.      rivaroxaban 10 MG Tabs tablet   Commonly known as: XARELTO   Take 1 tablet (10 mg total) by mouth daily with breakfast. Take for two and a half more weeks, then discontinue Xarelto.      rosuvastatin 10 MG tablet   Commonly known as: CRESTOR   Take 10 mg by mouth at bedtime.      tapentadol 50 MG  Tabs   Commonly known as: NUCYNTA   Take 1-2 tablets (50-100 mg total) by mouth every 4 (four) hours as needed.           Follow-up Information    Follow up with Loanne Drilling, MD. Schedule an appointment as soon as possible for a visit in 2 weeks.   Contact information:   [facility] [address] [place] [address] [phone]          Signed: Patrica Duel 09/08/2011, 8:11 AM

## 2011-09-08 NOTE — Progress Notes (Signed)
Physical Therapy Treatment Patient Details Name: Kerri [name] MRN: [id number] DOB: [birth year] Today's Date: 09/08/2011 Time: 7829-5621 PT Time Calculation (min): 18 min  PT Assessment / Plan / Recommendation Comments on Treatment Session  Pt continuing to improve with mobility tasks but with limited endurance    Follow Up Recommendations  Skilled nursing facility    Equipment Recommendations  None recommended by OT    Frequency      Precautions / Restrictions Precautions Precautions: Posterior Hip Precaution Comments: pt recalls no THP Restrictions Weight Bearing Restrictions: Yes LLE Weight Bearing: Partial weight bearing LLE Partial Weight Bearing Percentage or Pounds: 25-50   Pertinent Vitals/Pain     Mobility  Transfers Sit to Stand: 3: Mod assist Stand to Sit: 3: Mod assist Details for Transfer Assistance: cues for LE management and for use of UEs to assist Ambulation/Gait Ambulation/Gait Assistance: 4: Min guard;4: Min Environmental consultant (Feet): 25 Feet (25' and 15') Assistive device: Rolling walker Ambulation/Gait Assistance Details: cues for posture, ER on L and position from RW Gait Pattern: Step-to pattern Gait velocity: steady    Exercises     PT Goals Acute Rehab PT Goals PT Goal Formulation: With patient Potential to Achieve Goals: Fair Pt will go Supine/Side to Sit: with min assist PT Goal: Supine/Side to Sit - Progress: Progressing toward goal Pt will go Sit to Supine/Side: with min assist PT Goal: Sit to Supine/Side - Progress: Progressing toward goal Pt will go Sit to Stand: with min assist PT Goal: Sit to Stand - Progress: Progressing toward goal Pt will go Stand to Sit: with min assist PT Goal: Stand to Sit - Progress: Progressing toward goal Pt will Ambulate: 16 - 50 feet;with min assist;with rolling walker PT Goal: Ambulate - Progress: Progressing toward goal  Visit Information  Last PT Received On: 09/08/11 Assistance Needed:  +1    Subjective Data  Subjective: I'm so tired   Cognition  Overall Cognitive Status: Appears within functional limits for tasks assessed/performed Arousal/Alertness: Awake/alert Orientation Level: Appears intact for tasks assessed Behavior During Session: [facility] for tasks performed    Balance     End of Session @[id number] ([id number])@    Narcisa Ganesh 09/08/2011, 12:57 PM

## 2011-09-14 ENCOUNTER — Encounter (HOSPITAL_COMMUNITY): Payer: Self-pay | Admitting: Orthopedic Surgery

## 2011-12-11 ENCOUNTER — Telehealth (INDEPENDENT_AMBULATORY_CARE_PROVIDER_SITE_OTHER): Payer: Self-pay

## 2011-12-11 NOTE — Telephone Encounter (Signed)
The pt left me a message to call and I did.  She said she hasn't been in lately because she hasn't found any new "bumps".  She wanted to check in with [place].  She wondered what Dr Janee Morn thought about not hearing from her.  She had a hip replacement and is 3 months out not using a cane or walker.  She states she will send brownies one Wednesday via her husband.  She will also call when or if she gets a new lump for Dr Janee Morn to check.

## 2012-01-31 ENCOUNTER — Ambulatory Visit (INDEPENDENT_AMBULATORY_CARE_PROVIDER_SITE_OTHER): Payer: Medicare Other | Admitting: General Surgery

## 2012-01-31 ENCOUNTER — Encounter (INDEPENDENT_AMBULATORY_CARE_PROVIDER_SITE_OTHER): Payer: Self-pay | Admitting: General Surgery

## 2012-01-31 VITALS — BP 127/80 | HR 85 | Temp 97.7°F | Resp 22

## 2012-01-31 DIAGNOSIS — C4370 Malignant melanoma of unspecified lower limb, including hip: Secondary | ICD-10-CM

## 2012-01-31 NOTE — Progress Notes (Signed)
Subjective:     Patient ID: Kerri [name], female   DOB: [birth year], 78 y.o.   MRN: [id number]  HPI Patient presents for followup of melanoma left lower extremity. Of note she had a precancerous lesion removed from the back of her neck. She is followed closely by dermatology. She has not found any new nodules in her left thigh. Her son is being hospitalized today for urinary tract infection.  Review of Systems     Objective:   Physical Exam  Constitutional: She appears well-developed and well-nourished.  Cardiovascular: Normal rate and normal heart sounds.   Pulmonary/Chest: Effort normal and breath sounds normal.  Neurological: She is alert.   Lymph exam reveals no supraclavicular, cervical, axillary, or inguinal adenopathy bilaterally. Left lower extremity exam reveals scars from previous surgery and nodule removals but no new palpable nodularity in the thigh or calf. Old skin graft site remains intact. Left inguinal lymph node dissection scar remains intact.    Assessment:     Melanoma left lower extremity with no evidence of metastatic nodules at this time    Plan:     Return when necessary, continue followup with dermatology. Patient is not seeing oncology by her choice.

## 2012-02-20 ENCOUNTER — Other Ambulatory Visit: Payer: Self-pay | Admitting: Internal Medicine

## 2012-02-20 DIAGNOSIS — Z1231 Encounter for screening mammogram for malignant neoplasm of breast: Secondary | ICD-10-CM

## 2012-02-21 ENCOUNTER — Other Ambulatory Visit (INDEPENDENT_AMBULATORY_CARE_PROVIDER_SITE_OTHER): Payer: Self-pay | Admitting: General Surgery

## 2012-02-21 DIAGNOSIS — C4370 Malignant melanoma of unspecified lower limb, including hip: Secondary | ICD-10-CM

## 2012-02-23 ENCOUNTER — Ambulatory Visit: Payer: Medicare Other | Admitting: Cardiology

## 2012-02-28 ENCOUNTER — Encounter (INDEPENDENT_AMBULATORY_CARE_PROVIDER_SITE_OTHER): Payer: Self-pay

## 2012-03-01 ENCOUNTER — Ambulatory Visit
Admission: RE | Admit: 2012-03-01 | Discharge: 2012-03-01 | Disposition: A | Discharge status: Home / Self Care | Payer: Medicare Other | Source: Ambulatory Visit | Attending: General Surgery | Admitting: General Surgery

## 2012-03-01 ENCOUNTER — Telehealth: Payer: Self-pay | Admitting: General Surgery

## 2012-03-01 DIAGNOSIS — C4370 Malignant melanoma of unspecified lower limb, including hip: Secondary | ICD-10-CM

## 2012-03-01 MED ORDER — GADOBENATE DIMEGLUMINE 529 MG/ML IV SOLN
12.0000 mL | Freq: Once | INTRAVENOUS | Status: AC | PRN
  Administered 2012-03-01: 12 mL via INTRAVENOUS

## 2012-03-01 NOTE — Telephone Encounter (Signed)
I called patient and gave her the results of her MR.  Questions answered.

## 2012-03-20 ENCOUNTER — Encounter (INDEPENDENT_AMBULATORY_CARE_PROVIDER_SITE_OTHER): Payer: Self-pay | Admitting: General Surgery

## 2012-03-20 ENCOUNTER — Ambulatory Visit (INDEPENDENT_AMBULATORY_CARE_PROVIDER_SITE_OTHER): Payer: Medicare Other | Admitting: General Surgery

## 2012-03-20 VITALS — BP 130/82 | HR 60 | Resp 18

## 2012-03-20 DIAGNOSIS — L989 Disorder of the skin and subcutaneous tissue, unspecified: Secondary | ICD-10-CM

## 2012-03-20 DIAGNOSIS — C4370 Malignant melanoma of unspecified lower limb, including hip: Secondary | ICD-10-CM

## 2012-03-20 DIAGNOSIS — C44721 Squamous cell carcinoma of skin of unspecified lower limb, including hip: Secondary | ICD-10-CM

## 2012-03-20 NOTE — Progress Notes (Signed)
Subjective:     Patient ID: Kerri [name], female   DOB: 12-Oct-1929, 78 y.o.   MRN: [id number]  HPI Patient has history of melanoma left lower extremity. She noticed a discolored, flaky lesion on her lateral left calf. She presents for evaluation.  Review of Systems     Objective:   Physical Exam Old scar from shin excision remains healed. This new lesion is 8 mm and erythematous with some overlying peeling. Area was prepped in a sterile fashion. Local anesthetic was injected. Elliptical incision was made to excise it completely. It was sent to pathology and the wound was closed with interrupted 30 nylons. She tolerated well. There was good hemostasis.    Assessment:     Skin lesion left lower extremity    Plan:     This was excised as above. We'll check pathology. Return in 2 weeks for suture removal.

## 2012-03-22 ENCOUNTER — Telehealth: Payer: Self-pay | Admitting: General Surgery

## 2012-03-22 NOTE — Telephone Encounter (Signed)
I called the patient to tell her the results of her pathology.  It showed squamous cell carcinoma in situ.  I told her I will remove margins when she returns for suture removal.

## 2012-03-25 ENCOUNTER — Ambulatory Visit
Admission: RE | Admit: 2012-03-25 | Discharge: 2012-03-25 | Disposition: A | Discharge status: Home / Self Care | Payer: Medicare Other | Source: Ambulatory Visit | Attending: Internal Medicine | Admitting: Internal Medicine

## 2012-03-25 DIAGNOSIS — Z1231 Encounter for screening mammogram for malignant neoplasm of breast: Secondary | ICD-10-CM

## 2012-04-03 ENCOUNTER — Ambulatory Visit (INDEPENDENT_AMBULATORY_CARE_PROVIDER_SITE_OTHER): Payer: Medicare Other | Admitting: General Surgery

## 2012-04-03 ENCOUNTER — Encounter (INDEPENDENT_AMBULATORY_CARE_PROVIDER_SITE_OTHER): Payer: Self-pay | Admitting: General Surgery

## 2012-04-03 VITALS — BP 142/90 | Temp 96.0°F | Resp 18 | Wt 135.0 lb

## 2012-04-03 DIAGNOSIS — D0470 Carcinoma in situ of skin of unspecified lower limb, including hip: Secondary | ICD-10-CM

## 2012-04-03 NOTE — Addendum Note (Signed)
Addended byIvory Broad on: 04/03/2012 11:14 AM   Modules accepted: Orders

## 2012-04-03 NOTE — Progress Notes (Signed)
Subjective:     Patient ID: Kerri [name], female   DOB: 11/03/1929, 78 y.o.   MRN: [id number]  HPI Patient reexcision of lesion on the posterior left calf. Biopsy showed squamous cell carcinoma in situ with positive lateral margin. She presents for suture removal and reexcision.  Review of Systems     Objective:   Physical Exam Area was prepped in sterile fashion. Local was injected. Sutures were removed. Long elliptical incision was made to encompass the entirety of the previous excision approximately 1cm. Wound was closed with interrupted 3-0 nylon.    Assessment:     Squamous cell carcinoma in situ left calf    Plan:     Reexcised as above. Followup in 2 weeks for suture removal.

## 2012-04-08 ENCOUNTER — Telehealth (INDEPENDENT_AMBULATORY_CARE_PROVIDER_SITE_OTHER): Payer: Self-pay

## 2012-04-08 NOTE — Telephone Encounter (Signed)
I called the pt and  let her know the margins are clear on her pathology. She has a follow up for next week.

## 2012-04-15 ENCOUNTER — Encounter: Payer: Self-pay | Admitting: Cardiology

## 2012-04-15 ENCOUNTER — Ambulatory Visit (INDEPENDENT_AMBULATORY_CARE_PROVIDER_SITE_OTHER): Payer: Medicare Other | Admitting: Cardiology

## 2012-04-15 VITALS — BP 128/86 | HR 80 | Ht 65.5 in | Wt 135.0 lb

## 2012-04-15 DIAGNOSIS — I2510 Atherosclerotic heart disease of native coronary artery without angina pectoris: Secondary | ICD-10-CM

## 2012-04-15 DIAGNOSIS — E785 Hyperlipidemia, unspecified: Secondary | ICD-10-CM

## 2012-04-15 DIAGNOSIS — I10 Essential (primary) hypertension: Secondary | ICD-10-CM

## 2012-04-15 NOTE — Progress Notes (Signed)
HPI The patient presents for followup of her hypertension and premature atrial contractions. Since I last saw her she has done well. She denies any chest pressure, neck or arm discomfort. She denies any palpitations, presyncope or syncope. She's had no new shortness of breath, PND or orthopnea.  She had hip surgery earlier this year and recovered well from this. Unfortunately she is now having back problems and is limited by this.  Allergies  Allergen Reactions  . Codeine Nausea And Vomiting    Patient gets sick on stomach  . Morphine And Related Itching    Happened a while back. Patient noted that any narcotic based medication makes her have a bad reaction.  . Tramadol Other (See Comments)    Makes patient feel as if she is not aware of her surroundings. Has to lay down to feel better.  . Adhesive (Tape) Other (See Comments)    Tears skin   . Hydrocodone-Acetaminophen   . Lactose Intolerance (Gi) Other (See Comments)    Gi upset   . Oxycodone Nausea And Vomiting    Current Outpatient Prescriptions  Medication Sig Dispense Refill  . esomeprazole (NEXIUM) 40 MG capsule Take 40 mg by mouth daily before breakfast.       . hydrochlorothiazide (MICROZIDE) 12.5 MG capsule Take 12.5 mg by mouth daily after breakfast.       . levothyroxine (SYNTHROID, LEVOTHROID) 75 MCG tablet Take 75 mcg by mouth daily with breakfast.      . LIDODERM 5 % Place 1 patch onto the skin daily as needed. Pain       . loratadine (CLARITIN) 10 MG tablet Take 10 mg by mouth daily.       . Magnesium 500 MG TABS Take 500 mg by mouth daily.      Marland Kitchen OLUX-E 0.05 % topical foam Apply 1 application topically 2 (two) times daily as needed. Itching       . polyethylene glycol powder (GLYCOLAX/MIRALAX) powder Take 17 g by mouth daily.      Marland Kitchen RETIN-A 0.1 % cream       . rosuvastatin (CRESTOR) 10 MG tablet Take 10 mg by mouth at bedtime.       . rivaroxaban (XARELTO) 10 MG TABS tablet Take 1 tablet (10 mg total) by mouth  daily with breakfast. Take for two and a half more weeks, then discontinue Xarelto.  18 tablet  0    Past Medical History  Diagnosis Date  . CAD (coronary artery disease)     Chest CTA 10/07: 40% or less pLAD;  ETT 8/09: negative  . Dyslipidemia   . HTN (hypertension)   . DJD (degenerative joint disease)   . Hypothyroidism   . Melanoma     metastatic; left leg; s/p multiple excisions  . Carotid bruit   . Spinal stenosis   . Chronic back pain   . Osteoporosis   . GERD (gastroesophageal reflux disease)   . IBS (irritable bowel syndrome)     Past Surgical History  Procedure Date  . Melanoma excision   . Knee arthroscopy   . Shoulder surgery   . Carpal tunnel release   . Toe surgery   . Back surgery   . Total hip arthroplasty 09/04/2011    Procedure: TOTAL HIP ARTHROPLASTY;  Surgeon: Loanne Drilling, MD;  Location: [facility];  Service: Orthopedics;  Laterality: Left;    ROS:  As stated in the HPI and negative for all other systems.  PHYSICAL EXAM  BP 128/86  Pulse 80  Ht 5' 5.5" (1.664 m)  Wt 135 lb (61.236 kg)  BMI 22.12 kg/m2  SpO2 99% GENERAL:  Well appearing NECK:  No jugular venous distention, waveform within normal limits, carotid upstroke brisk and symmetric, no bruits, no thyromegaly LUNGS:  Clear to auscultation bilaterally BACK:  No CVA tenderness CHEST:  Unremarkable HEART:  PMI not displaced or sustained,S1 and S2 within normal limits, no S3, no S4, no clicks, no rubs, no murmurs ABD:  Flat, positive bowel sounds normal in frequency in pitch, no bruits, no rebound, no guarding, no midline pulsatile mass, no hepatomegaly, no splenomegaly EXT:  2 plus pulses throughout, no edema, no cyanosis no clubbing   EKG:  Sinus rhythm, rate 80, premature atrial contractions, axis within normal limits, intervals WNL.  04/15/2012  ASSESSMENT AND PLAN  HYPERTENSION  The blood pressure is very well controlled. At this point no change in therapy is indicated.    PREMATURE  BEATS  The patient has premature ectopic complexes noted as described. However, these are not particularly symptomatic. She doesn't notice them and doesn't have syncope or presyncope. Therefore, no further evaluation is necessary.    CAD  The patient had nonobstructive CAD and will continue with primary risk reduction.

## 2012-04-16 ENCOUNTER — Ambulatory Visit (INDEPENDENT_AMBULATORY_CARE_PROVIDER_SITE_OTHER): Payer: Medicare Other

## 2012-04-16 DIAGNOSIS — Z4802 Encounter for removal of sutures: Secondary | ICD-10-CM

## 2012-04-16 NOTE — Progress Notes (Signed)
The pt came in for suture removal.  She had a squamous cell removed from her left lower leg.  Her incision has healed well.  I removed the sutures and applied a bandaid.  She made a follow up in December to have her skin checked and see if there is anything that needs to be excised.  She will call if her Dermatology appointment is after that and if she needs to be rescheduled here.

## 2012-04-17 ENCOUNTER — Encounter (INDEPENDENT_AMBULATORY_CARE_PROVIDER_SITE_OTHER): Payer: Medicare Other | Admitting: General Surgery

## 2012-05-01 ENCOUNTER — Other Ambulatory Visit (HOSPITAL_COMMUNITY): Payer: Self-pay | Admitting: Physical Medicine and Rehabilitation

## 2012-05-01 DIAGNOSIS — IMO0002 Reserved for concepts with insufficient information to code with codable children: Secondary | ICD-10-CM

## 2012-05-03 NOTE — Addendum Note (Signed)
Addended by: Williemae Area on: 05/03/2012 05:37 PM   Modules accepted: Orders

## 2012-05-06 ENCOUNTER — Other Ambulatory Visit: Payer: Self-pay | Admitting: Physician Assistant

## 2012-05-08 ENCOUNTER — Other Ambulatory Visit (INDEPENDENT_AMBULATORY_CARE_PROVIDER_SITE_OTHER): Payer: Self-pay

## 2012-05-08 ENCOUNTER — Ambulatory Visit (INDEPENDENT_AMBULATORY_CARE_PROVIDER_SITE_OTHER): Payer: Medicare Other | Admitting: General Surgery

## 2012-05-08 ENCOUNTER — Encounter (INDEPENDENT_AMBULATORY_CARE_PROVIDER_SITE_OTHER): Payer: Self-pay | Admitting: General Surgery

## 2012-05-08 VITALS — BP 118/78 | HR 74 | Temp 97.8°F | Resp 16

## 2012-05-08 DIAGNOSIS — Z85820 Personal history of malignant melanoma of skin: Secondary | ICD-10-CM

## 2012-05-08 DIAGNOSIS — Z9889 Other specified postprocedural states: Secondary | ICD-10-CM

## 2012-05-08 DIAGNOSIS — D0470 Carcinoma in situ of skin of unspecified lower limb, including hip: Secondary | ICD-10-CM

## 2012-05-08 DIAGNOSIS — C4370 Malignant melanoma of unspecified lower limb, including hip: Secondary | ICD-10-CM

## 2012-05-08 NOTE — Progress Notes (Signed)
Subjective:     Patient ID: Kerri [name], female   DOB: [birth year], 78 y.o.   MRN: [id number]  HPI She is status post excision squamous cell in situ dear. She Found a new lesion.  Review of Systems     Objective:   Physical Exam Vernona Rieger her posterior left calf her site is healing well. Sutures have been removed previously. She has a new spot more proximal on her posterior left calf. It is raised and reddened. There is some scaling.  Procedure: The area was prepped in a sterile fashion. Lidocaine was injected. Elliptical incision was made to encompass the lesion. Wound was closed with interrupted 3-0 nylon. She tolerated this well.    Assessment:     History of left lower extremity melanoma and squamous cell carcinoma, new lesion    Plan:     Lesion was removed as above. We will check pathology. Return for nurse visit and suture removal in 2 weeks.

## 2012-05-09 ENCOUNTER — Telehealth: Payer: Self-pay | Admitting: General Surgery

## 2012-05-09 NOTE — Telephone Encounter (Signed)
Called patient and notified her of pathology

## 2012-05-10 ENCOUNTER — Ambulatory Visit (HOSPITAL_COMMUNITY)
Admission: RE | Admit: 2012-05-10 | Discharge: 2012-05-10 | Disposition: A | Discharge status: Home / Self Care | Payer: Medicare Other | Source: Ambulatory Visit | Attending: Physical Medicine and Rehabilitation | Admitting: Physical Medicine and Rehabilitation

## 2012-05-10 DIAGNOSIS — IMO0002 Reserved for concepts with insufficient information to code with codable children: Secondary | ICD-10-CM

## 2012-05-13 ENCOUNTER — Other Ambulatory Visit (HOSPITAL_COMMUNITY): Payer: Self-pay | Admitting: Interventional Radiology

## 2012-05-13 DIAGNOSIS — IMO0002 Reserved for concepts with insufficient information to code with codable children: Secondary | ICD-10-CM

## 2012-05-14 ENCOUNTER — Other Ambulatory Visit: Payer: Self-pay | Admitting: Radiology

## 2012-05-14 ENCOUNTER — Other Ambulatory Visit: Payer: Self-pay | Admitting: Physician Assistant

## 2012-05-16 ENCOUNTER — Ambulatory Visit (HOSPITAL_COMMUNITY)
Admission: RE | Admit: 2012-05-16 | Discharge: 2012-05-16 | Disposition: A | Discharge status: Home / Self Care | Payer: Medicare Other | Source: Ambulatory Visit | Attending: Interventional Radiology | Admitting: Interventional Radiology

## 2012-05-16 ENCOUNTER — Encounter (HOSPITAL_COMMUNITY): Payer: Self-pay

## 2012-05-16 ENCOUNTER — Other Ambulatory Visit (HOSPITAL_COMMUNITY): Payer: Self-pay | Admitting: Interventional Radiology

## 2012-05-16 DIAGNOSIS — S3210XA Unspecified fracture of sacrum, initial encounter for closed fracture: Secondary | ICD-10-CM | POA: Insufficient documentation

## 2012-05-16 DIAGNOSIS — IMO0002 Reserved for concepts with insufficient information to code with codable children: Secondary | ICD-10-CM

## 2012-05-16 DIAGNOSIS — W19XXXA Unspecified fall, initial encounter: Secondary | ICD-10-CM | POA: Insufficient documentation

## 2012-05-16 DIAGNOSIS — Y92009 Unspecified place in unspecified non-institutional (private) residence as the place of occurrence of the external cause: Secondary | ICD-10-CM | POA: Insufficient documentation

## 2012-05-16 DIAGNOSIS — I10 Essential (primary) hypertension: Secondary | ICD-10-CM | POA: Insufficient documentation

## 2012-05-16 DIAGNOSIS — S322XXA Fracture of coccyx, initial encounter for closed fracture: Secondary | ICD-10-CM | POA: Insufficient documentation

## 2012-05-16 LAB — CBC
HCT: 36.4 % (ref 36.0–46.0)
Hemoglobin: 11.6 g/dL — ABNORMAL LOW (ref 12.0–15.0)
MCH: 26.5 pg (ref 26.0–34.0)
MCHC: 31.9 g/dL (ref 30.0–36.0)
MCV: 83.1 fL (ref 78.0–100.0)
Platelets: 168 10*3/uL (ref 150–400)
RBC: 4.38 MIL/uL (ref 3.87–5.11)
RDW: 14.1 % (ref 11.5–15.5)
WBC: 4.2 10*3/uL (ref 4.0–10.5)

## 2012-05-16 LAB — COMPREHENSIVE METABOLIC PANEL
ALT: 21 U/L (ref 0–35)
AST: 37 U/L (ref 0–37)
Albumin: 4.1 g/dL (ref 3.5–5.2)
Alkaline Phosphatase: 127 U/L — ABNORMAL HIGH (ref 39–117)
BUN: 22 mg/dL (ref 6–23)
CO2: 26 mEq/L (ref 19–32)
Calcium: 9.5 mg/dL (ref 8.4–10.5)
Chloride: 102 mEq/L (ref 96–112)
Creatinine, Ser: 0.72 mg/dL (ref 0.50–1.10)
GFR calc Af Amer: >90 mL/min (ref >90)
GFR calc non Af Amer: 79 mL/min — ABNORMAL LOW (ref >90)
Glucose, Bld: 106 mg/dL — ABNORMAL HIGH (ref 70–99)
Potassium: 4.7 mEq/L (ref 3.5–5.1)
Sodium: 140 mEq/L (ref 135–145)
Total Bilirubin: 0.3 mg/dL (ref 0.3–1.2)
Total Protein: 7.2 g/dL (ref 6.0–8.3)

## 2012-05-16 LAB — PROTIME-INR
INR: 0.97 (ref 0.00–1.49)
Prothrombin Time: 12.8 seconds (ref 11.6–15.2)

## 2012-05-16 LAB — APTT: aPTT: 33 seconds (ref 24–37)

## 2012-05-16 MED ORDER — TOBRAMYCIN SULFATE 1.2 G IJ SOLR
INTRAMUSCULAR | Status: AC
  Filled 2012-05-16: qty 1.2

## 2012-05-16 MED ORDER — SODIUM CHLORIDE 0.9 % IV SOLN: INTRAVENOUS | Status: AC

## 2012-05-16 MED ORDER — MIDAZOLAM HCL 2 MG/2ML IJ SOLN
INTRAMUSCULAR | Status: AC
  Filled 2012-05-16: qty 4

## 2012-05-16 MED ORDER — CEFAZOLIN SODIUM 1-5 GM-% IV SOLN
1.0000 g | Freq: Once | INTRAVENOUS | Status: AC
  Administered 2012-05-16: 1 g via INTRAVENOUS
  Filled 2012-05-16: qty 50

## 2012-05-16 MED ORDER — IOHEXOL 300 MG/ML  SOLN
50.0000 mL | Freq: Once | INTRAMUSCULAR | Status: AC | PRN
  Administered 2012-05-16: 6 mL via INTRAVENOUS

## 2012-05-16 MED ORDER — FENTANYL CITRATE 0.05 MG/ML IJ SOLN
INTRAMUSCULAR | Status: AC | PRN
  Administered 2012-05-16 (×3): 25 ug via INTRAVENOUS

## 2012-05-16 MED ORDER — MIDAZOLAM HCL 2 MG/2ML IJ SOLN
INTRAMUSCULAR | Status: AC | PRN
  Administered 2012-05-16 (×3): 1 mg via INTRAVENOUS

## 2012-05-16 MED ORDER — FENTANYL CITRATE 0.05 MG/ML IJ SOLN
INTRAMUSCULAR | Status: AC
  Filled 2012-05-16: qty 4

## 2012-05-16 MED ORDER — SODIUM CHLORIDE 0.9 % IV SOLN: Freq: Once | INTRAVENOUS | Status: DC

## 2012-05-16 MED ORDER — HYDROMORPHONE HCL PF 1 MG/ML IJ SOLN
INTRAMUSCULAR | Status: AC
  Filled 2012-05-16: qty 2

## 2012-05-16 NOTE — H&P (Signed)
Kerri [name] is an 78 y.o. female.   Chief Complaint: Back pain and left hip/leg pain x 1 month Fell at home last month MRI reveals sacral insufficiency fracture Was consulted with Dr Corliss Skains last week Scheduled now for sacroplasty HPI: buttock and left leg pain; CAD; HLD; hypothyroid; Melanoma - left leg; spinal stenosis; IBS; GERD   Past Medical History  Diagnosis Date  . CAD (coronary artery disease)     Chest CTA 10/07: 40% or less pLAD;  ETT 8/09: negative  . Dyslipidemia   . HTN (hypertension)   . DJD (degenerative joint disease)   . Hypothyroidism   . Melanoma     metastatic; left leg; s/p multiple excisions  . Carotid bruit   . Spinal stenosis   . Chronic back pain   . Osteoporosis   . GERD (gastroesophageal reflux disease)   . IBS (irritable bowel syndrome)     Past Surgical History  Procedure Date  . Melanoma excision   . Knee arthroscopy   . Shoulder surgery   . Carpal tunnel release   . Toe surgery   . Back surgery   . Total hip arthroplasty 09/04/2011    Procedure: TOTAL HIP ARTHROPLASTY;  Surgeon: Loanne Drilling, MD;  Location: [facility];  Service: Orthopedics;  Laterality: Left;    No family history on file. Social History:  reports that she quit smoking about 63 years ago. She has never used smokeless tobacco. She reports that she drinks alcohol. She reports that she does not use illicit drugs.  Allergies:  Allergies  Allergen Reactions  . Codeine Nausea And Vomiting    Patient gets sick on stomach  . Morphine And Related Itching    Happened a while back. Patient noted that any narcotic based medication makes her have a bad reaction.  . Tramadol Other (See Comments)    Makes patient feel as if she is not aware of her surroundings. Has to lay down to feel better.  . Adhesive (Tape) Other (See Comments)    Tears skin   . Hydrocodone-Acetaminophen   . Lactose Intolerance (Gi) Other (See Comments)    Gi upset   . Oxycodone Nausea And Vomiting      (Not in a hospital admission)  No results found for this or any previous visit (from the past 48 hour(s)). No results found.  Review of Systems  Constitutional: Negative for fever, chills and weight loss.  Respiratory: Negative for shortness of breath.   Cardiovascular: Negative for chest pain.  Gastrointestinal: Negative for nausea and vomiting.  Musculoskeletal: Positive for back pain and joint pain.       Pain left leg  Neurological: Positive for weakness. Negative for headaches.    There were no vitals taken for this visit. Physical Exam  Constitutional: She is oriented to person, place, and time. She appears well-developed and well-nourished.  Cardiovascular: Normal rate, regular rhythm and normal heart sounds.   No murmur heard. Respiratory: Effort normal and breath sounds normal. She has no wheezes.  GI: Soft. Bowel sounds are normal. There is no tenderness.  Musculoskeletal: Normal range of motion.       Pt complains of left thigh pain and "fullness" x 1 week 2+ pulses; warm; NT Left Thigh does seem tighter than Rt  Neurological: She is alert and oriented to person, place, and time.  Skin: Skin is warm and dry.  Psychiatric: She has a normal mood and affect. Her behavior is normal. Judgment and thought content  normal.     Assessment/Plan Fell at home last month Pain in left leg and buttocks MRI reveals sacral insufficiency fracture Scheduled now for Sacroplasty Pt aware of procedure benefits and risks and agreeable to proceed Consent signed and in chart   Kerri [name] A 05/16/2012, 8:44 AM

## 2012-05-16 NOTE — Procedures (Signed)
S/P S1 vertebroplasty

## 2012-05-20 ENCOUNTER — Encounter (INDEPENDENT_AMBULATORY_CARE_PROVIDER_SITE_OTHER): Payer: Medicare Other

## 2012-05-20 ENCOUNTER — Ambulatory Visit (INDEPENDENT_AMBULATORY_CARE_PROVIDER_SITE_OTHER): Payer: Medicare Other

## 2012-05-20 ENCOUNTER — Telehealth (INDEPENDENT_AMBULATORY_CARE_PROVIDER_SITE_OTHER): Payer: Self-pay

## 2012-05-20 DIAGNOSIS — Z4802 Encounter for removal of sutures: Secondary | ICD-10-CM

## 2012-05-20 NOTE — Telephone Encounter (Signed)
The patient called this morning because she was supposed to come today and get her sutures removed.  She forgot she had 2 other appointments today.  She is seeing her Dermatologist to remove other sutures and check another area so she plans on asking her to remove Dr Carollee Massed sutures.  She has had a recent back procedure where they put cement in her spine and she is in some pain.  This is her 1st day out.  I told her I understand.  She knows Dr Janee Morn wanted [place] to remove his sutures but she asked me to let him know what is going on.

## 2012-05-20 NOTE — Progress Notes (Signed)
The pt came in today for suture removal from a skin lesion Dr Janee Morn removed.  There were 2 sutures but she had shaved the top of one off.  I was able to remove both of them.  The incision healed well.  She will call me when she returns from her trip to schedule a follow up.

## 2012-05-27 ENCOUNTER — Other Ambulatory Visit (HOSPITAL_COMMUNITY): Payer: Self-pay | Admitting: Interventional Radiology

## 2012-05-27 DIAGNOSIS — IMO0002 Reserved for concepts with insufficient information to code with codable children: Secondary | ICD-10-CM

## 2012-05-31 ENCOUNTER — Ambulatory Visit (HOSPITAL_COMMUNITY)
Admission: RE | Admit: 2012-05-31 | Discharge: 2012-05-31 | Disposition: A | Discharge status: Home / Self Care | Payer: Medicare Other | Source: Ambulatory Visit | Attending: Interventional Radiology | Admitting: Interventional Radiology

## 2012-05-31 DIAGNOSIS — IMO0002 Reserved for concepts with insufficient information to code with codable children: Secondary | ICD-10-CM

## 2012-06-17 ENCOUNTER — Telehealth: Payer: Self-pay | Admitting: Cardiology

## 2012-06-17 NOTE — Telephone Encounter (Signed)
New Problem:    Patient called in because for the past three days the patient has been experiencing consistent chest pains even while lying horizontal.  Patient reported that she was not experiencing any other symptoms.  Please call back.

## 2012-06-17 NOTE — Telephone Encounter (Signed)
Per pt call - states she has been having chest pain "pretty constant" for 3 days.  She denies any other s/s though she may have had some jaw pain one night.  This discomfort continues thru-out the day and at night too.  Not related to activity.  She denies any SOB.  Should would like to be evaluated for this.  She has been an appointment for 1/29 at 10:30 but is aware to report to ED for eval should chest pain charge or worsen  She states understanding.

## 2012-06-19 ENCOUNTER — Encounter: Payer: Self-pay | Admitting: Cardiology

## 2012-06-19 ENCOUNTER — Ambulatory Visit (INDEPENDENT_AMBULATORY_CARE_PROVIDER_SITE_OTHER): Payer: Medicare Other | Admitting: Cardiology

## 2012-06-19 VITALS — BP 115/60 | HR 83 | Ht 65.0 in | Wt 136.0 lb

## 2012-06-19 DIAGNOSIS — I2510 Atherosclerotic heart disease of native coronary artery without angina pectoris: Secondary | ICD-10-CM

## 2012-06-19 DIAGNOSIS — I4949 Other premature depolarization: Secondary | ICD-10-CM

## 2012-06-19 DIAGNOSIS — I10 Essential (primary) hypertension: Secondary | ICD-10-CM

## 2012-06-19 NOTE — Progress Notes (Signed)
HPI The patient presents for followup chest pain. She reports that this started about 4-5 days ago. It was a sharp stabbing discomfort that would happen frequently throughout the day. Actually over the last 24-48 hours it is better. She says it would happen at rest. She has not done it with usual activities. She denies any neck or arm discomfort. She denies any palpitations, presyncope or syncope. She's not had any new shortness of breath, PND or orthopnea. She's quite limited in her activities because of back pains. She does think that it happens more with eating. She's had some chronic stomach issues.  Allergies  Allergen Reactions  . Codeine Nausea And Vomiting    Patient gets sick on stomach  . Morphine And Related Itching    Happened a while back. Patient noted that any narcotic based medication makes her have a bad reaction.  . Tramadol Nausea And Vomiting and Other (See Comments)    confusion  . Adhesive (Tape) Other (See Comments)    Tears skin   . Hydrocodone-Acetaminophen Nausea And Vomiting  . Lactose Intolerance (Gi) Other (See Comments)    Gi upset   . Oxycodone Nausea And Vomiting    Current Outpatient Prescriptions  Medication Sig Dispense Refill  . esomeprazole (NEXIUM) 40 MG capsule Take 40 mg by mouth daily before breakfast.       . hydrochlorothiazide (MICROZIDE) 12.5 MG capsule Take 12.5 mg by mouth daily after breakfast.       . hydroxypropyl methylcellulose (ISOPTO TEARS) 2.5 % ophthalmic solution Place 1 drop into both eyes 4 (four) times daily as needed. Dry eyes      . levothyroxine (SYNTHROID, LEVOTHROID) 75 MCG tablet Take 75 mcg by mouth daily with breakfast.      . LIDODERM 5 % Place 1 patch onto the skin daily as needed. Pain       . loratadine (CLARITIN) 10 MG tablet Take 10 mg by mouth daily.       . Magnesium 500 MG TABS Take 500 mg by mouth daily.      Marland Kitchen OLUX-E 0.05 % topical foam Apply 1 application topically 2 (two) times daily as needed. To  scalp for Itching      . polyethylene glycol powder (GLYCOLAX/MIRALAX) powder Take 17 g by mouth daily.      Marland Kitchen RETIN-A 0.1 % cream Apply 1 application topically 2 (two) times a week.       . rosuvastatin (CRESTOR) 10 MG tablet Take 10 mg by mouth at bedtime.       . sertraline (ZOLOFT) 50 MG tablet Take 50 mg by mouth Daily.       Marland Kitchen VITAMIN D, CHOLECALCIFEROL, PO Take 1 tablet by mouth daily.        Past Medical History  Diagnosis Date  . CAD (coronary artery disease)     Chest CTA 10/07: 40% or less pLAD;  ETT 8/09: negative  . Dyslipidemia   . HTN (hypertension)   . DJD (degenerative joint disease)   . Hypothyroidism   . Melanoma     metastatic; left leg; s/p multiple excisions  . Carotid bruit   . Spinal stenosis   . Chronic back pain   . Osteoporosis   . GERD (gastroesophageal reflux disease)   . IBS (irritable bowel syndrome)     Past Surgical History  Procedure Date  . Melanoma excision   . Knee arthroscopy   . Shoulder surgery   . Carpal tunnel release   .  Toe surgery   . Back surgery   . Total hip arthroplasty 09/04/2011    Procedure: TOTAL HIP ARTHROPLASTY;  Surgeon: Loanne Drilling, MD;  Location: [facility];  Service: Orthopedics;  Laterality: Left;    ROS:  As stated in the HPI and negative for all other systems.  PHYSICAL EXAM There were no vitals taken for this visit. GENERAL:  Well appearing NECK:  No jugular venous distention, waveform within normal limits, carotid upstroke brisk and symmetric, no bruits, no thyromegaly LUNGS:  Clear to auscultation bilaterally BACK:  No CVA tenderness CHEST:  Unremarkable HEART:  PMI not displaced or sustained,S1 and S2 within normal limits, no S3, no S4, no clicks, no rubs, no murmurs ABD:  Flat, positive bowel sounds normal in frequency in pitch, no bruits, no rebound, no guarding, no midline pulsatile mass, no hepatomegaly, no splenomegaly EXT:  2 plus pulses throughout, no edema, no cyanosis no clubbing   EKG:   Sinus rhythm, rate 75, premature atrial contractions, axis within normal limits, intervals WNL.  06/19/2012  ASSESSMENT AND PLAN  CHEST PAIN - Her chest discomfort is quite atypical. There is no objective evidence of ischemia. I think the pretest probability of this coming from coronary disease is extremely unlikely. No further cardiovascular testing is suggested. I have advised her to follow with her primary provider for gastroenterology if this continues.  HYPERTENSION  The blood pressure is very well controlled. At this point no change in therapy is indicated.   PREMATURE BEATS  The patient has premature ectopic complexes noted as described. However, these are not particularly symptomatic and I don't think they are the cause of her current complaints. Therefore, no further evaluation is necessary.   CAD She had nonobstructive disease on cardiac CT in 2008.  I reviewed this result and we will continue with risk reduction.

## 2012-06-19 NOTE — Patient Instructions (Addendum)
The current medical regimen is effective;  continue present plan and medications.  Follow up as previously scheduled

## 2012-07-31 ENCOUNTER — Encounter (INDEPENDENT_AMBULATORY_CARE_PROVIDER_SITE_OTHER): Payer: Self-pay | Admitting: General Surgery

## 2012-07-31 ENCOUNTER — Other Ambulatory Visit (INDEPENDENT_AMBULATORY_CARE_PROVIDER_SITE_OTHER): Payer: Self-pay | Admitting: General Surgery

## 2012-07-31 ENCOUNTER — Ambulatory Visit (INDEPENDENT_AMBULATORY_CARE_PROVIDER_SITE_OTHER): Payer: Medicare Other | Admitting: General Surgery

## 2012-07-31 VITALS — BP 120/82 | HR 66

## 2012-07-31 DIAGNOSIS — C4372 Malignant melanoma of left lower limb, including hip: Secondary | ICD-10-CM

## 2012-07-31 DIAGNOSIS — C4370 Malignant melanoma of unspecified lower limb, including hip: Secondary | ICD-10-CM

## 2012-07-31 NOTE — Progress Notes (Signed)
Subjective:     Patient ID: Kerri [name], female   DOB: December 09, 1929, 79 y.o.   MRN: [id number]  HPI Patient noticed a small mass subcutaneously on medial left thigh while applying lotion. The area is mildly tender. No overlying skin changes have been noticed.  Review of Systems     Objective:   Physical Exam  Constitutional: She appears well-developed and well-nourished. No distress.  Cardiovascular: Normal rate and regular rhythm.   Pulmonary/Chest: Effort normal. No respiratory distress. She has no wheezes.   Left medial thigh has approximately 5 mm subcutaneous nodule which is palpable. There is no overlying skin change. Procedure note: Left medial thigh area was prepped and draped in a sterile fashion. 1% lidocaine with epinephrine was injected for local anesthesia. A linear incision was made. Subcutaneous tissues were dissected down and around this palpable nodule. It was very hard. It was removed completely with some surrounding adipose tissue. Hemostasis was obtained with pressure.Wound was closed in layers with deep tissues approximated with interrupted 3-0 Vicryl suture. Skin was closed with running 4-0 nylon.Dressing was applied.    Assessment:     History of malignant melanoma left lower leg with previous multiple in transit metastases left medial thigh    Plan:     This new subcutaneous mass was excised as above. We will send it to pathology. She will return for suture removal in 2 weeks.

## 2012-08-05 ENCOUNTER — Telehealth (INDEPENDENT_AMBULATORY_CARE_PROVIDER_SITE_OTHER): Payer: Self-pay

## 2012-08-05 NOTE — Telephone Encounter (Signed)
I called the pt and let her know that the bx revealed Melanoma.  I told her it looks like Dr Janee Morn may need to do further excision although I don't know his plan.  I scheduled an appointment to come in 4/2 to evaluate and proceed.  She has an appointment this week to have sutures removed.

## 2012-08-09 ENCOUNTER — Telehealth: Payer: Self-pay | Admitting: Cardiology

## 2012-08-09 NOTE — Telephone Encounter (Signed)
Pt calling states she has been having a headache and dizziness for the past couple of days.  She started taking her BP and HR today and it is has been varied.  HR from 49 to 72 which she reports as being low for her and a BP that has been elevated.  She is demanding to be seen.  Advised pt to continue to take her BP and HR over the weekend and keep a record of it.  She should bring it to the office on Monday when she is seen by Dr Antoine Poche.  An appointment was scheduled for her at 11:45 am  She is aware she is a work in appointment and says she will bring a book.  She is aware to call the MD on call if issues over the weekend.

## 2012-08-09 NOTE — Telephone Encounter (Signed)
New problem   Pt is calling to give BP and pluse readings, pt has been dizzy with headache  08/09/12 139/81 pulse 49 08/09/12 124/64 pluse 66

## 2012-08-09 NOTE — Telephone Encounter (Signed)
Follow up     Pt need someone to call her today. She say she want an EKG

## 2012-08-12 ENCOUNTER — Ambulatory Visit (INDEPENDENT_AMBULATORY_CARE_PROVIDER_SITE_OTHER): Payer: Medicare Other | Admitting: Cardiology

## 2012-08-12 ENCOUNTER — Encounter: Payer: Self-pay | Admitting: Cardiology

## 2012-08-12 VITALS — BP 116/68 | HR 47 | Ht 65.0 in | Wt 135.0 lb

## 2012-08-12 DIAGNOSIS — I2510 Atherosclerotic heart disease of native coronary artery without angina pectoris: Secondary | ICD-10-CM

## 2012-08-12 DIAGNOSIS — I10 Essential (primary) hypertension: Secondary | ICD-10-CM

## 2012-08-12 DIAGNOSIS — I4949 Other premature depolarization: Secondary | ICD-10-CM

## 2012-08-12 NOTE — Patient Instructions (Addendum)
The current medical regimen is effective;  continue present plan and medications.  Your physician has recommended that you wear a holter monitor for 48 hours. Holter monitors are medical devices that record the heart's electrical activity. Doctors most often use these monitors to diagnose arrhythmias. Arrhythmias are problems with the speed or rhythm of the heartbeat. The monitor is a small, portable device. You can wear one while you do your normal daily activities. This is usually used to diagnose what is causing palpitations/syncope (passing out).  Follow up with Dr Antoine Poche after wearing your monitor.

## 2012-08-12 NOTE — Progress Notes (Signed)
HPI The patient presents for followup palpitations.  She all the other day because she was having headaches. She took her blood pressure and noticed that the systolic was in the 140s. She was also told that her heart rate was in the 40s on the monitor. She wasn't feeling any palpitations. She was however having some dizziness. She denies any chest pressure neck discomfort. She's had no presyncope or syncope.  She did have one episode of sharp shoulder discomfort that came and went spontaneously. This was unprovoked. She's had no new shortness of breath, PND or orthopnea.  Allergies  Allergen Reactions  . Codeine Nausea And Vomiting    Patient gets sick on stomach  . Morphine And Related Itching    Happened a while back. Patient noted that any narcotic based medication makes her have a bad reaction.  . Tramadol Nausea And Vomiting and Other (See Comments)    confusion  . Adhesive (Tape) Other (See Comments)    Tears skin   . Hydrocodone-Acetaminophen Nausea And Vomiting  . Lactose Intolerance (Gi) Other (See Comments)    Gi upset   . Oxycodone Nausea And Vomiting    Current Outpatient Prescriptions  Medication Sig Dispense Refill  . esomeprazole (NEXIUM) 40 MG capsule Take 40 mg by mouth daily before breakfast.       . hydrochlorothiazide (MICROZIDE) 12.5 MG capsule Take 12.5 mg by mouth daily after breakfast.       . hydroxypropyl methylcellulose (ISOPTO TEARS) 2.5 % ophthalmic solution Place 1 drop into both eyes 4 (four) times daily as needed. Dry eyes      . levothyroxine (SYNTHROID, LEVOTHROID) 75 MCG tablet Take 75 mcg by mouth daily with breakfast.      . LIDODERM 5 % Place 1 patch onto the skin daily as needed. Pain       . loratadine (CLARITIN) 10 MG tablet Take 10 mg by mouth daily.       . Magnesium 500 MG TABS Take 500 mg by mouth daily.      Marland Kitchen OLUX-E 0.05 % topical foam Apply 1 application topically 2 (two) times daily as needed. To scalp for Itching      .  polyethylene glycol powder (GLYCOLAX/MIRALAX) powder Take 17 g by mouth daily.      Marland Kitchen RETIN-A 0.1 % cream Apply 1 application topically 2 (two) times a week.       . rosuvastatin (CRESTOR) 10 MG tablet Take 10 mg by mouth at bedtime.       . sertraline (ZOLOFT) 50 MG tablet Take 50 mg by mouth Daily.       Marland Kitchen VITAMIN D, CHOLECALCIFEROL, PO Take 1 tablet by mouth daily.       No current facility-administered medications for this visit.    Past Medical History  Diagnosis Date  . CAD (coronary artery disease)     Chest CTA 10/07: 40% or less pLAD;  ETT 8/09: negative  . Dyslipidemia   . HTN (hypertension)   . DJD (degenerative joint disease)   . Hypothyroidism   . Melanoma     metastatic; left leg; s/p multiple excisions  . Carotid bruit   . Spinal stenosis   . Chronic back pain   . Osteoporosis   . GERD (gastroesophageal reflux disease)   . IBS (irritable bowel syndrome)     Past Surgical History  Procedure Laterality Date  . Melanoma excision    . Knee arthroscopy    . Shoulder surgery    .  Carpal tunnel release    . Toe surgery    . Back surgery    . Total hip arthroplasty  09/04/2011    Procedure: TOTAL HIP ARTHROPLASTY;  Surgeon: Loanne Drilling, MD;  Location: [facility];  Service: Orthopedics;  Laterality: Left;    ROS:  As stated in the HPI and negative for all other systems.  PHYSICAL EXAM BP 116/68  Pulse 47  Ht 5\' 5"  (1.651 m)  Wt 135 lb (61.236 kg)  BMI 22.47 kg/m2 GENERAL:  Well appearing NECK:  No jugular venous distention, waveform within normal limits, carotid upstroke brisk and symmetric, no bruits, no thyromegaly LUNGS:  Clear to auscultation bilaterally BACK:  No CVA tenderness CHEST:  Unremarkable HEART:  PMI not displaced or sustained,S1 and S2 within normal limits, no S3, no S4, no clicks, no rubs, no murmurs ABD:  Flat, positive bowel sounds normal in frequency in pitch, no bruits, no rebound, no guarding, no midline pulsatile mass, no hepatomegaly,  no splenomegaly EXT:  2 plus pulses throughout, no edema, no cyanosis no clubbing   EKG:  Normal sinus rhythm, rate 66, axis within normal limits, intervals within normal limits, premature atrial complex. 0I and a low and he and a in a.  08/12/2012  ASSESSMENT AND PLAN   HYPERTENSION  Her blood pressure today is well controlled. She's had some minor elevations. I will not at this point change her therapy.  PREMATURE BEATS  The patient has a history of premature ectopic complexes . I suspect her monitor is undergone in her heart rate because of these. I will apply a 48 hour Holter.  CAD She had nonobstructive disease on cardiac CT in 2008.  She had a negative ETT in 2012.  I reviewed this result.  The shoulder pain that she had recently was atypical.  I am not planning a further work up at this time.

## 2012-08-13 ENCOUNTER — Encounter (INDEPENDENT_AMBULATORY_CARE_PROVIDER_SITE_OTHER): Payer: Medicare Other

## 2012-08-13 ENCOUNTER — Telehealth: Payer: Self-pay | Admitting: *Deleted

## 2012-08-13 DIAGNOSIS — I4949 Other premature depolarization: Secondary | ICD-10-CM

## 2012-08-13 NOTE — Telephone Encounter (Signed)
48 hr holter monitor placed on Pt 08/13/12 TK

## 2012-08-14 ENCOUNTER — Ambulatory Visit (INDEPENDENT_AMBULATORY_CARE_PROVIDER_SITE_OTHER): Payer: Medicare Other

## 2012-08-14 DIAGNOSIS — Z4802 Encounter for removal of sutures: Secondary | ICD-10-CM

## 2012-08-14 NOTE — Addendum Note (Signed)
Addended by: Reine Just on: 08/14/2012 09:18 AM   Modules accepted: Orders

## 2012-08-14 NOTE — Progress Notes (Signed)
The pt came in for suture removal from her left thigh.  Her incision has healed well.  I removed the sutures and applied a bandaid.  She has an appointment next week.

## 2012-08-21 ENCOUNTER — Other Ambulatory Visit (INDEPENDENT_AMBULATORY_CARE_PROVIDER_SITE_OTHER): Payer: Self-pay | Admitting: General Surgery

## 2012-08-21 ENCOUNTER — Ambulatory Visit (INDEPENDENT_AMBULATORY_CARE_PROVIDER_SITE_OTHER): Payer: Medicare Other | Admitting: General Surgery

## 2012-08-21 ENCOUNTER — Encounter (INDEPENDENT_AMBULATORY_CARE_PROVIDER_SITE_OTHER): Payer: Self-pay | Admitting: General Surgery

## 2012-08-21 VITALS — BP 86/62 | HR 64 | Temp 97.6°F | Resp 12 | Ht 65.0 in | Wt 138.0 lb

## 2012-08-21 DIAGNOSIS — C4372 Malignant melanoma of left lower limb, including hip: Secondary | ICD-10-CM

## 2012-08-21 DIAGNOSIS — C44729 Squamous cell carcinoma of skin of left lower limb, including hip: Secondary | ICD-10-CM

## 2012-08-21 DIAGNOSIS — C4370 Malignant melanoma of unspecified lower limb, including hip: Secondary | ICD-10-CM

## 2012-08-21 NOTE — Progress Notes (Signed)
Subjective:     Patient ID: Kerri [name], female   DOB: [birth year], 79 y.o.   MRN: [id number]  HPI Patient returns status post excision of nodule left medial thigh. This was metastatic melanoma. Margins were positive so she presents for reexcision of this area. She prefers in office surgery.  Review of Systems     Objective:   Physical Exam Wound is well healed. Sutures were removed 2 weeks ago. No evidence of infection. No residual palpable nodularity. Area was prepped in a sterile fashion. Local anesthetic was used to make a field block down into the subcutaneous fat as well.  Wide elliptical incision was made to extend to above and beyond the previous scar. Subcutaneous fat was also taken down to above the fascia. Wide excision was sent to pathology. It was 3 x 2 cm. Wound was closed with running 3-0 nylon. She tolerated it well. A dressing was applied. Afterward she showed me a small area on her left posterior calf midway up. It feels like a small calcified nodule. It is subcutaneous. She claims it has not changed for 10 years.    Assessment:     Metastatic melanoma left thigh    Plan:     Reexcision as above. Will await pathology.  Right calf nodule is unlikely to be an issue as it has not changed in 10 years. It is likely a calcified cyst. We will continue to check it. I let her No I am happy to remove it should any concern persists.Return in 2 weeks for suture removal.

## 2012-08-22 ENCOUNTER — Encounter: Payer: Self-pay | Admitting: Cardiology

## 2012-08-22 ENCOUNTER — Ambulatory Visit (INDEPENDENT_AMBULATORY_CARE_PROVIDER_SITE_OTHER): Payer: Medicare Other | Admitting: Cardiology

## 2012-08-22 VITALS — BP 112/62 | HR 79 | Ht 65.0 in

## 2012-08-22 DIAGNOSIS — I2510 Atherosclerotic heart disease of native coronary artery without angina pectoris: Secondary | ICD-10-CM

## 2012-08-22 DIAGNOSIS — I4949 Other premature depolarization: Secondary | ICD-10-CM

## 2012-08-22 DIAGNOSIS — I10 Essential (primary) hypertension: Secondary | ICD-10-CM

## 2012-08-22 NOTE — Progress Notes (Signed)
HPI The patient presents for followup palpitations.  The patient had been noticing heart rates in the 40s on her monitor. This was when she was taking her blood pressures. I had her wear a 48 hour Holter which demonstrates sinus rhythm with occasional premature atrial fractions and fairly frequent premature ventricular contractions but no sustained dysrhythmias. She actually had no symptoms with this. She came back today to talk about this. She's had no presyncope or syncope. She denies any chest pressure, neck or arm discomfort. She's had no new shortness of breath, PND or orthopnea.  Allergies  Allergen Reactions  . Codeine Nausea And Vomiting    Patient gets sick on stomach  . Morphine And Related Itching    Happened a while back. Patient noted that any narcotic based medication makes her have a bad reaction.  . Tramadol Nausea And Vomiting and Other (See Comments)    confusion  . Adhesive (Tape) Other (See Comments)    Tears skin   . Hydrocodone-Acetaminophen Nausea And Vomiting  . Lactose Intolerance (Gi) Other (See Comments)    Gi upset   . Oxycodone Nausea And Vomiting    Current Outpatient Prescriptions  Medication Sig Dispense Refill  . esomeprazole (NEXIUM) 40 MG capsule Take 40 mg by mouth daily before breakfast.       . hydrochlorothiazide (MICROZIDE) 12.5 MG capsule Take 12.5 mg by mouth daily after breakfast.       . hydroxypropyl methylcellulose (ISOPTO TEARS) 2.5 % ophthalmic solution Place 1 drop into both eyes 4 (four) times daily as needed. Dry eyes      . levothyroxine (SYNTHROID, LEVOTHROID) 75 MCG tablet Take 75 mcg by mouth daily with breakfast.      . LIDODERM 5 % Place 1 patch onto the skin daily as needed. Pain       . loratadine (CLARITIN) 10 MG tablet Take 10 mg by mouth daily.       . Magnesium 500 MG TABS Take 500 mg by mouth daily.      Marland Kitchen OLUX-E 0.05 % topical foam Apply 1 application topically 2 (two) times daily as needed. To scalp for Itching        . polyethylene glycol powder (GLYCOLAX/MIRALAX) powder Take 17 g by mouth daily.      Marland Kitchen RETIN-A 0.1 % cream Apply 1 application topically 2 (two) times a week.       . rosuvastatin (CRESTOR) 10 MG tablet Take 10 mg by mouth at bedtime.       . sertraline (ZOLOFT) 50 MG tablet Take 50 mg by mouth Daily.       Marland Kitchen VITAMIN D, CHOLECALCIFEROL, PO Take 1 tablet by mouth daily.       No current facility-administered medications for this visit.    Past Medical History  Diagnosis Date  . CAD (coronary artery disease)     Chest CTA 10/07: 40% or less pLAD;  ETT 8/09: negative  . Dyslipidemia   . HTN (hypertension)   . DJD (degenerative joint disease)   . Hypothyroidism   . Melanoma     metastatic; left leg; s/p multiple excisions  . Carotid bruit   . Spinal stenosis   . Chronic back pain   . Osteoporosis   . GERD (gastroesophageal reflux disease)   . IBS (irritable bowel syndrome)     Past Surgical History  Procedure Laterality Date  . Melanoma excision    . Knee arthroscopy    . Shoulder surgery    .  Carpal tunnel release    . Toe surgery    . Back surgery    . Total hip arthroplasty  09/04/2011    Procedure: TOTAL HIP ARTHROPLASTY;  Surgeon: Loanne Drilling, MD;  Location: [facility];  Service: Orthopedics;  Laterality: Left;    ROS:  As stated in the HPI and negative for all other systems.  PHYSICAL EXAM BP 112/62  Pulse 79  Ht 5\' 5"  (1.651 m)  SpO2 98% GENERAL:  Well appearing LUNGS:  Clear to auscultation bilaterally CHEST:  Unremarkable HEART:  PMI not displaced or sustained,S1 and S2 within normal limits, no S3, no S4, no clicks, no rubs, no murmurs ABD:  Flat, positive bowel sounds normal in frequency in pitch, no bruits, no rebound, no guarding, no midline pulsatile mass, no hepatomegaly, no splenomegaly EXT:  2 plus pulses throughout, no edema, no cyanosis no clubbing   ASSESSMENT AND PLAN   HYPERTENSION  Her blood pressure today is well controlled.  We reviewed  this again. No change in therapy is indicated.   PREMATURE BEATS  She did have somewhat frequent ectopy.  However, there were no sustained arrhythmias. She has no significant symptoms. Therefore, no change in therapy is indicated.

## 2012-08-22 NOTE — Patient Instructions (Addendum)
The current medical regimen is effective;  continue present plan and medications.  Follow up in 1 year with Dr Hochrein.  You will receive a letter in the mail 2 months before you are due.  Please call us when you receive this letter to schedule your follow up appointment.  

## 2012-08-29 ENCOUNTER — Telehealth: Payer: Self-pay | Admitting: General Surgery

## 2012-08-29 NOTE — Telephone Encounter (Signed)
I called the patient discussed her pathology report. This demonstrated residual metastatic melanoma present In the specimen. We will discuss further plans at her followup appointment. I answered her questions.

## 2012-09-04 ENCOUNTER — Ambulatory Visit (INDEPENDENT_AMBULATORY_CARE_PROVIDER_SITE_OTHER): Payer: Medicare Other | Admitting: General Surgery

## 2012-09-04 ENCOUNTER — Encounter (INDEPENDENT_AMBULATORY_CARE_PROVIDER_SITE_OTHER): Payer: Self-pay | Admitting: General Surgery

## 2012-09-04 VITALS — BP 130/78 | HR 72 | Temp 97.3°F | Resp 12 | Wt 137.0 lb

## 2012-09-04 DIAGNOSIS — C4372 Malignant melanoma of left lower limb, including hip: Secondary | ICD-10-CM

## 2012-09-04 DIAGNOSIS — C4370 Malignant melanoma of unspecified lower limb, including hip: Secondary | ICD-10-CM

## 2012-09-04 NOTE — Progress Notes (Signed)
Subjective:     Patient ID: Kerri [name], female   DOB: 06/04/1929, 79 y.o.   MRN: [id number]  HPI Patient presented status post reexcision of metastatic melanoma site left medial thigh. She is feeling fine. I have discussed her pathology with her on the phone already. Her daughter, [name], accompanies her today.  Review of Systems     Objective:   Physical Exam Site left medial thigh is healing well. Sutures were removed. A bandage was placed. No evidence of infection.    Assessment:     Metastatic melanoma left medial thigh. I recommend further wide reexcision of this area.    Plan:     Isovue this case with my partner, Dr. Byerly. We both agree wide reexcision of this area is warranted. The patient would like to schedule this electively. We'll need to do this in the operating room as an outpatient procedure. Procedure, risks, benefits were discussed in detail with the patient. She will contact us after she checks her schedule. Her son is having surgery in the near future as well.      

## 2012-09-04 NOTE — Patient Instructions (Signed)
call regarding timing of surgery

## 2012-09-16 ENCOUNTER — Telehealth (INDEPENDENT_AMBULATORY_CARE_PROVIDER_SITE_OTHER): Payer: Self-pay

## 2012-09-16 NOTE — Progress Notes (Signed)
To come in 4/30 for bmet-sees dr hochrien-had all cardiac work up prior to total hip-4/13-recent halter monitor was ok.

## 2012-09-16 NOTE — Telephone Encounter (Signed)
I called the pt and touched base with her.  I told her we will wait until after surgery to get specific instructions on follow up.  She agrees with the plan.

## 2012-09-16 NOTE — Telephone Encounter (Signed)
Message copied by Ivory Broad on Mon Sep 16, 2012 11:53 AM ------      Message from: [facility]      Created: Mon Sep 16, 2012 11:37 AM      Contact: [phone]       SHE IS HAVING SURGERY ON HER LEG  09/19/2012 AND NEEDS A PO APPT AND THE NEXT ONE I SEE IS 10/16/2012 PLEASE CALL HER  ------

## 2012-09-18 ENCOUNTER — Encounter (HOSPITAL_BASED_OUTPATIENT_CLINIC_OR_DEPARTMENT_OTHER)
Admission: RE | Admit: 2012-09-18 | Discharge: 2012-09-18 | Disposition: A | Discharge status: Home / Self Care | Payer: Medicare Other | Source: Ambulatory Visit | Attending: General Surgery | Admitting: General Surgery

## 2012-09-18 LAB — BASIC METABOLIC PANEL
BUN: 23 mg/dL (ref 6–23)
CO2: 27 mEq/L (ref 19–32)
Calcium: 9.9 mg/dL (ref 8.4–10.5)
Chloride: 103 mEq/L (ref 96–112)
Creatinine, Ser: 0.82 mg/dL (ref 0.50–1.10)
GFR calc Af Amer: 76 mL/min — ABNORMAL LOW (ref >90)
GFR calc non Af Amer: 66 mL/min — ABNORMAL LOW (ref >90)
Glucose, Bld: 103 mg/dL — ABNORMAL HIGH (ref 70–99)
Potassium: 3.9 mEq/L (ref 3.5–5.1)
Sodium: 141 mEq/L (ref 135–145)

## 2012-09-19 ENCOUNTER — Ambulatory Visit (HOSPITAL_BASED_OUTPATIENT_CLINIC_OR_DEPARTMENT_OTHER): Payer: Medicare Other | Admitting: Certified Registered Nurse Anesthetist

## 2012-09-19 ENCOUNTER — Encounter (HOSPITAL_BASED_OUTPATIENT_CLINIC_OR_DEPARTMENT_OTHER): Payer: Self-pay | Admitting: Certified Registered Nurse Anesthetist

## 2012-09-19 ENCOUNTER — Ambulatory Visit (HOSPITAL_BASED_OUTPATIENT_CLINIC_OR_DEPARTMENT_OTHER)
Admission: RE | Admit: 2012-09-19 | Discharge: 2012-09-19 | Disposition: A | Discharge status: Home / Self Care | Payer: Medicare Other | Source: Ambulatory Visit | Attending: General Surgery | Admitting: General Surgery

## 2012-09-19 ENCOUNTER — Encounter (HOSPITAL_BASED_OUTPATIENT_CLINIC_OR_DEPARTMENT_OTHER)
Admission: RE | Disposition: A | Discharge status: Home / Self Care | Payer: Self-pay | Source: Ambulatory Visit | Attending: General Surgery

## 2012-09-19 ENCOUNTER — Encounter (HOSPITAL_BASED_OUTPATIENT_CLINIC_OR_DEPARTMENT_OTHER): Payer: Self-pay

## 2012-09-19 DIAGNOSIS — C4372 Malignant melanoma of left lower limb, including hip: Secondary | ICD-10-CM

## 2012-09-19 DIAGNOSIS — I2510 Atherosclerotic heart disease of native coronary artery without angina pectoris: Secondary | ICD-10-CM | POA: Insufficient documentation

## 2012-09-19 DIAGNOSIS — C4370 Malignant melanoma of unspecified lower limb, including hip: Secondary | ICD-10-CM

## 2012-09-19 DIAGNOSIS — I10 Essential (primary) hypertension: Secondary | ICD-10-CM | POA: Insufficient documentation

## 2012-09-19 HISTORY — PX: MELANOMA EXCISION: SHX5266

## 2012-09-19 LAB — POCT HEMOGLOBIN-HEMACUE: Hemoglobin: 13.4 g/dL (ref 12.0–15.0)

## 2012-09-19 SURGERY — EXCISION, MELANOMA
Anesthesia: General | Site: Thigh | Laterality: Left | Wound class: Clean

## 2012-09-19 MED ORDER — OXYCODONE HCL 5 MG/5ML PO SOLN: 5.0000 mg | Freq: Once | ORAL | Status: DC | PRN

## 2012-09-19 MED ORDER — ONDANSETRON HCL 4 MG/2ML IJ SOLN
INTRAMUSCULAR | Status: DC | PRN
  Administered 2012-09-19: 4 mg via INTRAVENOUS

## 2012-09-19 MED ORDER — MIDAZOLAM HCL 2 MG/2ML IJ SOLN: 1.0000 mg | INTRAMUSCULAR | Status: DC | PRN

## 2012-09-19 MED ORDER — DEXAMETHASONE SODIUM PHOSPHATE 4 MG/ML IJ SOLN
INTRAMUSCULAR | Status: DC | PRN
  Administered 2012-09-19: 4 mg via INTRAVENOUS

## 2012-09-19 MED ORDER — ONDANSETRON HCL 4 MG/2ML IJ SOLN: 4.0000 mg | Freq: Once | INTRAMUSCULAR | Status: DC | PRN

## 2012-09-19 MED ORDER — CHLORHEXIDINE GLUCONATE 4 % EX LIQD: 1.0000 "application " | Freq: Once | CUTANEOUS | Status: DC

## 2012-09-19 MED ORDER — FENTANYL CITRATE 0.05 MG/ML IJ SOLN: 50.0000 ug | INTRAMUSCULAR | Status: DC | PRN

## 2012-09-19 MED ORDER — HYDROMORPHONE HCL PF 1 MG/ML IJ SOLN: 0.2500 mg | INTRAMUSCULAR | Status: DC | PRN

## 2012-09-19 MED ORDER — OXYCODONE HCL 5 MG PO TABS: 5.0000 mg | ORAL_TABLET | Freq: Once | ORAL | Status: DC | PRN

## 2012-09-19 MED ORDER — CEFAZOLIN SODIUM-DEXTROSE 2-3 GM-% IV SOLR
2.0000 g | INTRAVENOUS | Status: AC
  Administered 2012-09-19: 2 g via INTRAVENOUS

## 2012-09-19 MED ORDER — LACTATED RINGERS IV SOLN
INTRAVENOUS | Status: DC
  Administered 2012-09-19: 10:00:00 via INTRAVENOUS
  Administered 2012-09-19: 20 mL/h via INTRAVENOUS

## 2012-09-19 MED ORDER — PROPOFOL 10 MG/ML IV BOLUS
INTRAVENOUS | Status: DC | PRN
  Administered 2012-09-19: 100 mg via INTRAVENOUS

## 2012-09-19 MED ORDER — LIDOCAINE HCL (CARDIAC) 20 MG/ML IV SOLN
INTRAVENOUS | Status: DC | PRN
  Administered 2012-09-19: 60 mg via INTRAVENOUS

## 2012-09-19 MED ORDER — BUPIVACAINE-EPINEPHRINE 0.5% -1:200000 IJ SOLN
INTRAMUSCULAR | Status: DC | PRN
  Administered 2012-09-19: 10 mL

## 2012-09-19 MED ORDER — FENTANYL CITRATE 0.05 MG/ML IJ SOLN
INTRAMUSCULAR | Status: DC | PRN
  Administered 2012-09-19: 50 ug via INTRAVENOUS

## 2012-09-19 SURGICAL SUPPLY — 62 items
ADH SKN CLS APL DERMABOND .7 (GAUZE/BANDAGES/DRESSINGS)
APL SKNCLS STERI-STRIP NONHPOA (GAUZE/BANDAGES/DRESSINGS)
BANDAGE ELASTIC 4 VELCRO ST LF (GAUZE/BANDAGES/DRESSINGS) ×2 IMPLANT
BENZOIN TINCTURE PRP APPL 2/3 (GAUZE/BANDAGES/DRESSINGS) IMPLANT
BLADE HEX COATED 2.75 (ELECTRODE) ×2 IMPLANT
BLADE SURG 15 STRL LF DISP TIS (BLADE) ×1 IMPLANT
BLADE SURG 15 STRL SS (BLADE) ×2
BLADE SURG ROTATE 9660 (MISCELLANEOUS) IMPLANT
CANISTER SUCTION 1200CC (MISCELLANEOUS) ×2 IMPLANT
CHLORAPREP W/TINT 26ML (MISCELLANEOUS) ×2 IMPLANT
CLOTH BEACON ORANGE TIMEOUT ST (SAFETY) ×2 IMPLANT
COVER MAYO STAND STRL (DRAPES) ×2 IMPLANT
COVER TABLE BACK 60X90 (DRAPES) ×2 IMPLANT
DECANTER SPIKE VIAL GLASS SM (MISCELLANEOUS) ×2 IMPLANT
DERMABOND ADVANCED (GAUZE/BANDAGES/DRESSINGS)
DERMABOND ADVANCED .7 DNX12 (GAUZE/BANDAGES/DRESSINGS) IMPLANT
DRAPE PED LAPAROTOMY (DRAPES) ×2 IMPLANT
DRAPE U-SHAPE 76X120 STRL (DRAPES) IMPLANT
DRAPE UTILITY XL STRL (DRAPES) ×2 IMPLANT
DRSG PAD ABDOMINAL 8X10 ST (GAUZE/BANDAGES/DRESSINGS) ×1 IMPLANT
ELECT REM PT RETURN 9FT ADLT (ELECTROSURGICAL) ×2
ELECTRODE REM PT RTRN 9FT ADLT (ELECTROSURGICAL) ×1 IMPLANT
GAUZE SPONGE 4X4 12PLY STRL LF (GAUZE/BANDAGES/DRESSINGS) IMPLANT
GAUZE XEROFORM 1X8 LF (GAUZE/BANDAGES/DRESSINGS) ×2 IMPLANT
GLOVE BIO SURGEON STRL SZ7 (GLOVE) ×2 IMPLANT
GLOVE BIO SURGEON STRL SZ8 (GLOVE) ×2 IMPLANT
GLOVE BIOGEL PI IND STRL 6.5 (GLOVE) IMPLANT
GLOVE BIOGEL PI IND STRL 7.0 (GLOVE) ×1 IMPLANT
GLOVE BIOGEL PI IND STRL 8 (GLOVE) ×1 IMPLANT
GLOVE BIOGEL PI INDICATOR 6.5 (GLOVE) ×1
GLOVE BIOGEL PI INDICATOR 7.0 (GLOVE) ×1
GLOVE BIOGEL PI INDICATOR 8 (GLOVE) ×1
GLOVE ECLIPSE 7.0 STRL STRAW (GLOVE) ×2 IMPLANT
GLOVE EXAM NITRILE MD LF STRL (GLOVE) ×2 IMPLANT
GOWN PREVENTION PLUS XLARGE (GOWN DISPOSABLE) ×4 IMPLANT
GOWN PREVENTION PLUS XXLARGE (GOWN DISPOSABLE) ×2 IMPLANT
NEEDLE 27GAX1X1/2 (NEEDLE) IMPLANT
NEEDLE HYPO 25X1 1.5 SAFETY (NEEDLE) ×2 IMPLANT
NS IRRIG 1000ML POUR BTL (IV SOLUTION) ×1 IMPLANT
PACK BASIN DAY SURGERY FS (CUSTOM PROCEDURE TRAY) ×2 IMPLANT
PENCIL BUTTON HOLSTER BLD 10FT (ELECTRODE) ×2 IMPLANT
SHEET MEDIUM DRAPE 40X70 STRL (DRAPES) IMPLANT
SLEEVE SCD COMPRESS KNEE MED (MISCELLANEOUS) ×2 IMPLANT
SPONGE GAUZE 4X4 12PLY (GAUZE/BANDAGES/DRESSINGS) ×2 IMPLANT
STRIP CLOSURE SKIN 1/2X4 (GAUZE/BANDAGES/DRESSINGS) IMPLANT
SUT ETHILON 3 0 PS 1 (SUTURE) ×6 IMPLANT
SUT ETHILON 5 0 PS 2 18 (SUTURE) IMPLANT
SUT MON AB 4-0 PC3 18 (SUTURE) IMPLANT
SUT VIC AB 2-0 SH 27 (SUTURE)
SUT VIC AB 2-0 SH 27XBRD (SUTURE) IMPLANT
SUT VIC AB 3-0 PS1 18 (SUTURE)
SUT VIC AB 3-0 PS1 18XBRD (SUTURE) IMPLANT
SUT VIC AB 4-0 SH 18 (SUTURE) IMPLANT
SUT VIC AB 5-0 PS2 18 (SUTURE) IMPLANT
SUT VICRYL 3-0 CR8 SH (SUTURE) ×2 IMPLANT
SYR BULB 3OZ (MISCELLANEOUS) ×2 IMPLANT
SYR CONTROL 10ML LL (SYRINGE) ×2 IMPLANT
TOWEL OR 17X24 6PK STRL BLUE (TOWEL DISPOSABLE) ×4 IMPLANT
TOWEL OR NON WOVEN STRL DISP B (DISPOSABLE) ×2 IMPLANT
TUBE CONNECTING 20X1/4 (TUBING) ×2 IMPLANT
UNDERPAD 30X30 INCONTINENT (UNDERPADS AND DIAPERS) ×2 IMPLANT
YANKAUER SUCT BULB TIP NO VENT (SUCTIONS) ×2 IMPLANT

## 2012-09-19 NOTE — Anesthesia Preprocedure Evaluation (Signed)
Anesthesia Evaluation  Patient identified by MRN, date of birth, ID band Patient awake    Reviewed: Allergy & Precautions, H&P , NPO status , Patient's Chart, lab work & pertinent test results  Airway Mallampati: I TM Distance: >3 FB Neck ROM: Full    Dental  (+) Upper Dentures, Lower Dentures and Dental Advisory Given   Pulmonary  breath sounds clear to auscultation        Cardiovascular Exercise Tolerance: Good hypertension, Pt. on medications + CAD Rhythm:Regular Rate:Normal     Neuro/Psych    GI/Hepatic   Endo/Other    Renal/GU      Musculoskeletal   Abdominal   Peds  Hematology   Anesthesia Other Findings   Reproductive/Obstetrics                           Anesthesia Physical Anesthesia Plan  ASA: III  Anesthesia Plan: General   Post-op Pain Management:    Induction: Intravenous  Airway Management Planned: LMA  Additional Equipment:   Intra-op Plan:   Post-operative Plan: Extubation in OR  Informed Consent: I have reviewed the patients History and Physical, chart, labs and discussed the procedure including the risks, benefits and alternatives for the proposed anesthesia with the patient or authorized representative who has indicated his/her understanding and acceptance.   Dental advisory given  Plan Discussed with: CRNA, Anesthesiologist and Surgeon  Anesthesia Plan Comments:         Anesthesia Quick Evaluation

## 2012-09-19 NOTE — Anesthesia Postprocedure Evaluation (Signed)
  Anesthesia Post-op Note  Patient: Kerri [name]  Procedure(s) Performed: Procedure(s): WIDE EXCISION METASTATIC MELANOMA LEFT MEDIAL THIGH (Left)  Patient Location: PACU  Anesthesia Type:General  Level of Consciousness: awake, alert  and oriented  Airway and Oxygen Therapy: Patient Spontanous Breathing  Post-op Pain: mild  Post-op Assessment: Post-op Vital signs reviewed  Post-op Vital Signs: Reviewed  Complications: No apparent anesthesia complications

## 2012-09-19 NOTE — Transfer of Care (Signed)
Immediate Anesthesia Transfer of Care Note  Patient: Kerri [name]  Procedure(s) Performed: Procedure(s): WIDE EXCISION METASTATIC MELANOMA LEFT MEDIAL THIGH (Left)  Patient Location: PACU  Anesthesia Type:General  Level of Consciousness: awake and patient cooperative  Airway & Oxygen Therapy: Patient Spontanous Breathing and Patient connected to face mask oxygen  Post-op Assessment: Report given to PACU RN and Post -op Vital signs reviewed and stable  Post vital signs: Reviewed and stable  Complications: No apparent anesthesia complications

## 2012-09-19 NOTE — Interval H&P Note (Signed)
History and Physical Interval Note:  09/19/2012 10:02 AM  Kerri [name]  has presented today for surgery, with the diagnosis of Metastatic melanoma left medial thigh  The various methods of treatment have been discussed with the patient and family. After consideration of risks, benefits and other options for treatment, the patient has consented to  Procedure(s): WIDE EXCISION METASTATIC MELANOMA LEFT MEDIAL THIGH (Left) as a surgical intervention .  The patient's history has been reviewed, patient examined, no change in status, stable for surgery.  I have reviewed the patient's chart and labs.site marked.  Questions were answered to the patient's satisfaction.     Jazzmin Newbold E

## 2012-09-19 NOTE — H&P (View-Only) (Signed)
Subjective:     Patient ID: Kerri [name], female   DOB: [birth year], 79 y.o.   MRN: [id number]  HPI Patient presented status post reexcision of metastatic melanoma site left medial thigh. She is feeling fine. I have discussed her pathology with her on the phone already. Her daughter, Alvis Lemmings, accompanies her today.  Review of Systems     Objective:   Physical Exam Site left medial thigh is healing well. Sutures were removed. A bandage was placed. No evidence of infection.    Assessment:     Metastatic melanoma left medial thigh. I recommend further wide reexcision of this area.    Plan:     Isovue this case with my partner, Dr. Donell Beers. We both agree wide reexcision of this area is warranted. The patient would like to schedule this electively. We'll need to do this in the operating room as an outpatient procedure. Procedure, risks, benefits were discussed in detail with the patient. She will contact us after she checks her schedule. Her son is having surgery in the near future as well.

## 2012-09-19 NOTE — Op Note (Addendum)
09/19/2012  11:06 AM  PATIENT:  Kerri [name]  79 y.o. female  PRE-OPERATIVE DIAGNOSIS:  Metastatic melanoma left medial thigh  POST-OPERATIVE DIAGNOSIS:  Metastatic melanoma left medial thigh  PROCEDURE:  Procedure(s): WIDE RE-EXCISION METASTATIC MELANOMA LEFT MEDIAL THIGH (10x4cm plus 5 margins) WITH LAYERED CLOSURE  SURGEON:  Surgeon(s): Liz Malady, MD  PHYSICIAN ASSISTANT:   ASSISTANTS: none   ANESTHESIA:   local and general  EBL:  Total I/O In: 800 [I.V.:800] Out: -   BLOOD ADMINISTERED:none  DRAINS: none   SPECIMEN:  Excision  DISPOSITION OF SPECIMEN:  PATHOLOGY  COUNTS:  YES  DICTATION: Reubin Milan Dictation  Patient underwent excision of a nodule of metastatic melanoma on her left medial thigh in my office. We excision of the area was also done, per her choice, in the office. There were scattered solitary melanocytes present in the fat. We are proceeding with larger wide reexcision of the area. She was identified in preop holding area. Her site was marked. Informed consent was obtained. She received intravenous antibiotics. She was brought to the operating room and general anesthesia with laryngeal mask airway was administered by the anesthesia staff. Left thigh was prepped and draped in sterile fashion. We did time out procedure. Half percent Marcaine with epinephrine was injected. Elliptical incision was made to encompass a 1.5 cm or greater margin around the previous excision site. Subcutaneous tissues were dissected down using cautery. Excision was taken down to the underlying fascia. Small vessels were cauterized and the specimen was removed. It was labeled for orientation for pathology. Next, I took margins of the subcutaneous tissues from proximal, anterior, posterior, distal, and deep areas. Hemostasis was again ensured. Wound was then closed in layers with deep tissues approximated with interrupted 3-0 Vicryl suture and the skin closed with interrupted 3-0 nylon  sutures. Xeroform and a sterile dressing were applied. Sponge, needle, and instrument counts were all correct. Patient tolerated the procedure without apparent complication and was taken recovery in stable condition.  PATIENT DISPOSITION:  PACU - hemodynamically stable.   Delay start of Pharmacological VTE agent (>24hrs) due to surgical blood loss or risk of bleeding:  no  Violeta Gelinas, MD, MPH, FACS Pager: [phone]  5/1/201411:06 AM

## 2012-09-19 NOTE — Anesthesia Procedure Notes (Signed)
Procedure Name: LMA Insertion Date/Time: 09/19/2012 10:14 AM Performed by: Neelah Mannings D Pre-anesthesia Checklist: Patient identified, Emergency Drugs available, Suction available and Patient being monitored Patient Re-evaluated:Patient Re-evaluated prior to inductionOxygen Delivery Method: Circle System Utilized Preoxygenation: Pre-oxygenation with 100% oxygen Intubation Type: IV induction Ventilation: Mask ventilation without difficulty LMA: LMA inserted LMA Size: 4.0 Number of attempts: 1 Airway Equipment and Method: bite block Placement Confirmation: positive ETCO2 Tube secured with: Tape Dental Injury: Teeth and Oropharynx as per pre-operative assessment

## 2012-09-20 ENCOUNTER — Telehealth (INDEPENDENT_AMBULATORY_CARE_PROVIDER_SITE_OTHER): Payer: Self-pay

## 2012-09-20 ENCOUNTER — Encounter (HOSPITAL_BASED_OUTPATIENT_CLINIC_OR_DEPARTMENT_OTHER): Payer: Self-pay | Admitting: General Surgery

## 2012-09-20 NOTE — Telephone Encounter (Signed)
I called the pt and gave her the postop visit for 5/19 at 3:30 per Dr Janee Morn.  She states she had some pain last night and took a Nucynta 50 mg pill that she had on hand.

## 2012-09-24 ENCOUNTER — Telehealth: Payer: Self-pay | Admitting: General Surgery

## 2012-09-24 NOTE — Telephone Encounter (Signed)
Called patient and discussed pathology.  She appreciated the call.

## 2012-10-07 ENCOUNTER — Encounter (INDEPENDENT_AMBULATORY_CARE_PROVIDER_SITE_OTHER): Payer: Self-pay | Admitting: General Surgery

## 2012-10-07 ENCOUNTER — Ambulatory Visit (INDEPENDENT_AMBULATORY_CARE_PROVIDER_SITE_OTHER): Payer: Medicare Other | Admitting: General Surgery

## 2012-10-07 VITALS — BP 140/84 | HR 68 | Temp 97.4°F | Resp 12

## 2012-10-07 DIAGNOSIS — C4372 Malignant melanoma of left lower limb, including hip: Secondary | ICD-10-CM

## 2012-10-07 DIAGNOSIS — C4370 Malignant melanoma of unspecified lower limb, including hip: Secondary | ICD-10-CM

## 2012-10-07 NOTE — Progress Notes (Signed)
Subjective:     Patient ID: Kerri [name], female   DOB: 07-02-1929, 79 y.o.   MRN: [id number]  HPI S/P re-excision in-transit metastatic melanoma L medial thigh - doing well  Review of Systems     Objective:   Physical Exam incsion CDI, sutures removed and steris placed    Assessment:     Doing well post-op    Plan:     Return PRN, pathology and wound care reviewed.  Patient will continue vigilant self exam

## 2012-10-16 ENCOUNTER — Encounter (INDEPENDENT_AMBULATORY_CARE_PROVIDER_SITE_OTHER): Payer: Medicare Other | Admitting: General Surgery

## 2012-11-06 ENCOUNTER — Other Ambulatory Visit (INDEPENDENT_AMBULATORY_CARE_PROVIDER_SITE_OTHER): Payer: Self-pay | Admitting: General Surgery

## 2012-11-06 ENCOUNTER — Ambulatory Visit (INDEPENDENT_AMBULATORY_CARE_PROVIDER_SITE_OTHER): Payer: Medicare Other | Admitting: General Surgery

## 2012-11-06 ENCOUNTER — Encounter (INDEPENDENT_AMBULATORY_CARE_PROVIDER_SITE_OTHER): Payer: Self-pay | Admitting: General Surgery

## 2012-11-06 VITALS — BP 104/80 | HR 66 | Temp 98.6°F | Resp 15 | Ht 65.0 in | Wt 136.0 lb

## 2012-11-06 DIAGNOSIS — L723 Sebaceous cyst: Secondary | ICD-10-CM

## 2012-11-06 DIAGNOSIS — R2241 Localized swelling, mass and lump, right lower limb: Secondary | ICD-10-CM

## 2012-11-06 DIAGNOSIS — C4372 Malignant melanoma of left lower limb, including hip: Secondary | ICD-10-CM

## 2012-11-06 NOTE — Progress Notes (Signed)
Subjective:     Patient ID: Kerri [name], female   DOB: 02/21/1930, 79 y.o.   MRN: [id number]  HPI Patient presents complaining of another area on her left anterior thigh. She also notes persistent small nodules right posterior calf.  Review of Systems     Objective:   Physical Exam Previous wide excision area has small but of granulation tissue centrally this was treated with silver nitrate  Left anterior thigh has small red skin bump with no palpable nodularity or other concerning features. She notes during exam it is gotten smaller recently.  Right posterior calf again has 2 small subcentimeter nodules subcutaneously which she requests be removed. Procedure: Right posterior calf was prepped in sterile fashion. 1% lidocaine was injected over these 2 palpable nodules. Small an incision was made over each one. Both appear to be small cysts with some cloudy fluid. They were excised and sent to pathology. Both wounds were closed with interrupted 3-0 nylon. There was good hemostasis.    Assessment:     Left lower extremity melanoma, status post wide excision of in transit metastases  Right posterior calf nodules    Plan:     Right posterior calf nodules excised as above. Return in 2 weeks for suture removal. We will notify her of the pathology results.

## 2012-11-11 ENCOUNTER — Telehealth: Payer: Self-pay | Admitting: General Surgery

## 2012-11-11 NOTE — Telephone Encounter (Signed)
Left message about pathology.

## 2012-11-20 ENCOUNTER — Ambulatory Visit (INDEPENDENT_AMBULATORY_CARE_PROVIDER_SITE_OTHER): Payer: Medicare Other | Admitting: General Surgery

## 2012-11-20 ENCOUNTER — Encounter (INDEPENDENT_AMBULATORY_CARE_PROVIDER_SITE_OTHER): Payer: Self-pay | Admitting: General Surgery

## 2012-11-20 VITALS — BP 120/70 | HR 60 | Wt 134.0 lb

## 2012-11-20 DIAGNOSIS — L723 Sebaceous cyst: Secondary | ICD-10-CM

## 2012-11-20 NOTE — Progress Notes (Signed)
Subjective:     Patient ID: Kerri [name], female   DOB: 01-Apr-1930, 79 y.o.   MRN: [id number]  HPI Patient presents status post excision of cyst x2 right posterior calf. She is doing well.  Review of Systems     Objective:   Physical Exam Right posterior calf incisions are healing well. Sutures were removed from both and Steri-Strips placed.  During the exam she notes a small Reddened area on her left thigh reexcision site. There is a tiny spot of granulation tissue. This was treated with silver nitrate. A bandage was applied. A small fragment of suture was also removed from a different portion of the wound. It is otherwise healed.    Assessment:     Do well status post excision of cyst x2 right posterior calf    Plan:     Return when necessary

## 2013-01-08 ENCOUNTER — Telehealth: Payer: Self-pay | Admitting: Cardiovascular Disease

## 2013-01-08 ENCOUNTER — Other Ambulatory Visit: Payer: Self-pay

## 2013-01-08 DIAGNOSIS — R42 Dizziness and giddiness: Secondary | ICD-10-CM

## 2013-01-08 DIAGNOSIS — R55 Syncope and collapse: Secondary | ICD-10-CM

## 2013-01-08 DIAGNOSIS — R001 Bradycardia, unspecified: Secondary | ICD-10-CM

## 2013-01-08 NOTE — Telephone Encounter (Signed)
PER PT   SEES DR HOCHREIN  HAS NOTED OVER THE PAST ALMOST WEEK   HAVING SPELLS  OF DIZZINESS  AND FAINT FEELING   LAST B/P WAS NOTED AT 109/52  DAUGHTER ASKED PT WHAT HEART RATE  HAD BEEN  AND PT NOTED   HEART RATES AS LOW AS IN  THE 40'S  WILL FORWARD TO DR [facility] FOR REVIEW .Zack Seal

## 2013-01-08 NOTE — Telephone Encounter (Signed)
Returned call to patient Dr.Hochrein advised needs a nurse visit EKG and wear 24 hr monitor.Schedulers will call back to schedule both in the same day.

## 2013-01-08 NOTE — Telephone Encounter (Signed)
Pt having trouble with BP, dtr told her she needs heart monitor, pls advise

## 2013-01-08 NOTE — Telephone Encounter (Signed)
She should come in for a nurse visit EKG and have a 24 hour monitor placed.

## 2013-01-09 NOTE — Progress Notes (Signed)
Talked with pt-her bp has been low and hr low-getting a holter monitor 01/13/13-sees dr Antoine Poche 8/26-if ok she can have surgery

## 2013-01-13 ENCOUNTER — Encounter: Payer: Self-pay | Admitting: *Deleted

## 2013-01-13 ENCOUNTER — Encounter (HOSPITAL_BASED_OUTPATIENT_CLINIC_OR_DEPARTMENT_OTHER): Payer: Self-pay | Admitting: *Deleted

## 2013-01-13 ENCOUNTER — Encounter (INDEPENDENT_AMBULATORY_CARE_PROVIDER_SITE_OTHER): Payer: Medicare Other

## 2013-01-13 DIAGNOSIS — R55 Syncope and collapse: Secondary | ICD-10-CM

## 2013-01-13 DIAGNOSIS — R001 Bradycardia, unspecified: Secondary | ICD-10-CM

## 2013-01-13 DIAGNOSIS — R42 Dizziness and giddiness: Secondary | ICD-10-CM

## 2013-01-13 DIAGNOSIS — I495 Sick sinus syndrome: Secondary | ICD-10-CM

## 2013-01-13 NOTE — Progress Notes (Signed)
Patient ID: Kerri [name], female   DOB: [birth year], 79 y.o.   MRN: [id number] E-Cardio 24 Hour Holter Monitor applied to patient.

## 2013-01-13 NOTE — Progress Notes (Signed)
Pt started 24 hour holter monitoring today. Pt is to see Dr. Antoine Poche tomorrow and have an EKG and if OK for surgery pt will come here for BMET.

## 2013-01-14 ENCOUNTER — Ambulatory Visit (INDEPENDENT_AMBULATORY_CARE_PROVIDER_SITE_OTHER): Payer: Medicare Other | Admitting: *Deleted

## 2013-01-14 ENCOUNTER — Encounter (HOSPITAL_BASED_OUTPATIENT_CLINIC_OR_DEPARTMENT_OTHER)
Admission: RE | Admit: 2013-01-14 | Discharge: 2013-01-14 | Disposition: A | Discharge status: Home / Self Care | Payer: Medicare Other | Source: Ambulatory Visit | Attending: Orthopedic Surgery | Admitting: Orthopedic Surgery

## 2013-01-14 VITALS — BP 120/80 | HR 72 | Resp 18

## 2013-01-14 DIAGNOSIS — I4949 Other premature depolarization: Secondary | ICD-10-CM

## 2013-01-14 DIAGNOSIS — I493 Ventricular premature depolarization: Secondary | ICD-10-CM

## 2013-01-14 LAB — BASIC METABOLIC PANEL
BUN: 18 mg/dL (ref 6–23)
CO2: 27 mEq/L (ref 19–32)
Calcium: 9.7 mg/dL (ref 8.4–10.5)
Chloride: 102 mEq/L (ref 96–112)
Creatinine, Ser: 0.78 mg/dL (ref 0.50–1.10)
GFR calc Af Amer: 88 mL/min — ABNORMAL LOW (ref >90)
GFR calc non Af Amer: 76 mL/min — ABNORMAL LOW (ref >90)
Glucose, Bld: 84 mg/dL (ref 70–99)
Potassium: 4.4 mEq/L (ref 3.5–5.1)
Sodium: 139 mEq/L (ref 135–145)

## 2013-01-14 NOTE — Patient Instructions (Signed)
Patient advised that her EKG is approved for preop per Dr.Hochrein. Will get a phone call when holter monitor is read.

## 2013-01-14 NOTE — Progress Notes (Signed)
Patient having foot surgery on 8/28. Visit for 12 lead EKG per Dr.Hochrein. Showed sinus rhythm with PVC's Reviewed by him and faxed to [facility] at [facility] with Dr.Hewitt at [phone].  Patient returned 24 hour holter monitor. Advised she will get a phone call with the results.

## 2013-01-15 ENCOUNTER — Other Ambulatory Visit: Payer: Self-pay | Admitting: Orthopedic Surgery

## 2013-01-16 ENCOUNTER — Encounter (HOSPITAL_BASED_OUTPATIENT_CLINIC_OR_DEPARTMENT_OTHER)
Admission: RE | Disposition: A | Discharge status: Home / Self Care | Payer: Self-pay | Source: Ambulatory Visit | Attending: Orthopedic Surgery

## 2013-01-16 ENCOUNTER — Ambulatory Visit (HOSPITAL_BASED_OUTPATIENT_CLINIC_OR_DEPARTMENT_OTHER): Payer: Medicare Other | Admitting: Certified Registered Nurse Anesthetist

## 2013-01-16 ENCOUNTER — Ambulatory Visit (HOSPITAL_BASED_OUTPATIENT_CLINIC_OR_DEPARTMENT_OTHER)
Admission: RE | Admit: 2013-01-16 | Discharge: 2013-01-16 | Disposition: A | Discharge status: Home / Self Care | Payer: Medicare Other | Source: Ambulatory Visit | Attending: Orthopedic Surgery | Admitting: Orthopedic Surgery

## 2013-01-16 ENCOUNTER — Encounter (HOSPITAL_BASED_OUTPATIENT_CLINIC_OR_DEPARTMENT_OTHER): Payer: Self-pay | Admitting: Certified Registered Nurse Anesthetist

## 2013-01-16 DIAGNOSIS — I2510 Atherosclerotic heart disease of native coronary artery without angina pectoris: Secondary | ICD-10-CM | POA: Insufficient documentation

## 2013-01-16 DIAGNOSIS — E785 Hyperlipidemia, unspecified: Secondary | ICD-10-CM | POA: Insufficient documentation

## 2013-01-16 DIAGNOSIS — F329 Major depressive disorder, single episode, unspecified: Secondary | ICD-10-CM | POA: Insufficient documentation

## 2013-01-16 DIAGNOSIS — Z888 Allergy status to other drugs, medicaments and biological substances status: Secondary | ICD-10-CM | POA: Insufficient documentation

## 2013-01-16 DIAGNOSIS — I499 Cardiac arrhythmia, unspecified: Secondary | ICD-10-CM | POA: Insufficient documentation

## 2013-01-16 DIAGNOSIS — M1990 Unspecified osteoarthritis, unspecified site: Secondary | ICD-10-CM | POA: Insufficient documentation

## 2013-01-16 DIAGNOSIS — Z79899 Other long term (current) drug therapy: Secondary | ICD-10-CM | POA: Insufficient documentation

## 2013-01-16 DIAGNOSIS — M2040 Other hammer toe(s) (acquired), unspecified foot: Secondary | ICD-10-CM | POA: Insufficient documentation

## 2013-01-16 DIAGNOSIS — F3289 Other specified depressive episodes: Secondary | ICD-10-CM | POA: Insufficient documentation

## 2013-01-16 DIAGNOSIS — M21619 Bunion of unspecified foot: Secondary | ICD-10-CM | POA: Insufficient documentation

## 2013-01-16 DIAGNOSIS — Z96649 Presence of unspecified artificial hip joint: Secondary | ICD-10-CM | POA: Insufficient documentation

## 2013-01-16 DIAGNOSIS — Z885 Allergy status to narcotic agent status: Secondary | ICD-10-CM | POA: Insufficient documentation

## 2013-01-16 DIAGNOSIS — K589 Irritable bowel syndrome without diarrhea: Secondary | ICD-10-CM | POA: Insufficient documentation

## 2013-01-16 DIAGNOSIS — Z9109 Other allergy status, other than to drugs and biological substances: Secondary | ICD-10-CM | POA: Insufficient documentation

## 2013-01-16 DIAGNOSIS — E039 Hypothyroidism, unspecified: Secondary | ICD-10-CM | POA: Insufficient documentation

## 2013-01-16 DIAGNOSIS — E739 Lactose intolerance, unspecified: Secondary | ICD-10-CM | POA: Insufficient documentation

## 2013-01-16 DIAGNOSIS — Z87891 Personal history of nicotine dependence: Secondary | ICD-10-CM | POA: Insufficient documentation

## 2013-01-16 DIAGNOSIS — M4800 Spinal stenosis, site unspecified: Secondary | ICD-10-CM | POA: Insufficient documentation

## 2013-01-16 DIAGNOSIS — I10 Essential (primary) hypertension: Secondary | ICD-10-CM | POA: Insufficient documentation

## 2013-01-16 DIAGNOSIS — M810 Age-related osteoporosis without current pathological fracture: Secondary | ICD-10-CM | POA: Insufficient documentation

## 2013-01-16 DIAGNOSIS — Z85820 Personal history of malignant melanoma of skin: Secondary | ICD-10-CM | POA: Insufficient documentation

## 2013-01-16 DIAGNOSIS — K219 Gastro-esophageal reflux disease without esophagitis: Secondary | ICD-10-CM | POA: Insufficient documentation

## 2013-01-16 DIAGNOSIS — M2011 Hallux valgus (acquired), right foot: Secondary | ICD-10-CM

## 2013-01-16 HISTORY — DX: Depression, unspecified: F32.A

## 2013-01-16 HISTORY — DX: Major depressive disorder, single episode, unspecified: F32.9

## 2013-01-16 HISTORY — PX: BUNIONECTOMY WITH HAMMERTOE RECONSTRUCTION: SHX5600

## 2013-01-16 LAB — POCT HEMOGLOBIN-HEMACUE: Hemoglobin: 12.6 g/dL (ref 12.0–15.0)

## 2013-01-16 SURGERY — BUNIONECTOMY, WITH HAMMER TOE CORRECTION
Anesthesia: Regional | Site: Foot | Laterality: Right | Wound class: Clean

## 2013-01-16 MED ORDER — FENTANYL CITRATE 0.05 MG/ML IJ SOLN
50.0000 ug | INTRAMUSCULAR | Status: DC | PRN
  Administered 2013-01-16: 100 ug via INTRAVENOUS

## 2013-01-16 MED ORDER — LACTATED RINGERS IV SOLN
INTRAVENOUS | Status: DC
  Administered 2013-01-16 (×2): via INTRAVENOUS

## 2013-01-16 MED ORDER — 0.9 % SODIUM CHLORIDE (POUR BTL) OPTIME
TOPICAL | Status: DC | PRN
  Administered 2013-01-16: 200 mL

## 2013-01-16 MED ORDER — BUPIVACAINE-EPINEPHRINE PF 0.5-1:200000 % IJ SOLN
INTRAMUSCULAR | Status: DC | PRN
  Administered 2013-01-16: 30 mL

## 2013-01-16 MED ORDER — BACITRACIN ZINC 500 UNIT/GM EX OINT
TOPICAL_OINTMENT | CUTANEOUS | Status: DC | PRN
  Administered 2013-01-16: 1 via TOPICAL

## 2013-01-16 MED ORDER — ONDANSETRON HCL 4 MG/2ML IJ SOLN
INTRAMUSCULAR | Status: DC | PRN
  Administered 2013-01-16: 4 mg via INTRAVENOUS

## 2013-01-16 MED ORDER — BUPIVACAINE HCL (PF) 0.5 % IJ SOLN
INTRAMUSCULAR | Status: DC | PRN
  Administered 2013-01-16: 10 mL

## 2013-01-16 MED ORDER — CHLORHEXIDINE GLUCONATE 4 % EX LIQD: 60.0000 mL | Freq: Once | CUTANEOUS | Status: DC

## 2013-01-16 MED ORDER — DEXAMETHASONE SODIUM PHOSPHATE 10 MG/ML IJ SOLN
INTRAMUSCULAR | Status: DC | PRN
  Administered 2013-01-16: 4 mg via INTRAVENOUS

## 2013-01-16 MED ORDER — LIDOCAINE HCL (CARDIAC) 20 MG/ML IV SOLN
INTRAVENOUS | Status: DC | PRN
  Administered 2013-01-16: 30 mg via INTRAVENOUS

## 2013-01-16 MED ORDER — TAPENTADOL HCL 50 MG PO TABS: 50.0000 mg | ORAL_TABLET | Freq: Four times a day (QID) | ORAL | Status: DC | PRN

## 2013-01-16 MED ORDER — SODIUM CHLORIDE 0.9 % IV SOLN: INTRAVENOUS | Status: DC

## 2013-01-16 MED ORDER — MIDAZOLAM HCL 2 MG/2ML IJ SOLN
1.0000 mg | INTRAMUSCULAR | Status: DC | PRN
  Administered 2013-01-16: 2 mg via INTRAVENOUS

## 2013-01-16 MED ORDER — PROPOFOL 10 MG/ML IV BOLUS
INTRAVENOUS | Status: DC | PRN
  Administered 2013-01-16: 120 mg via INTRAVENOUS

## 2013-01-16 MED ORDER — FENTANYL CITRATE 0.05 MG/ML IJ SOLN: 25.0000 ug | INTRAMUSCULAR | Status: DC | PRN

## 2013-01-16 MED ORDER — CEFAZOLIN SODIUM-DEXTROSE 2-3 GM-% IV SOLR
2.0000 g | INTRAVENOUS | Status: AC
  Administered 2013-01-16: 2 g via INTRAVENOUS

## 2013-01-16 SURGICAL SUPPLY — 74 items
BANDAGE CONFORM 3  STR LF (GAUZE/BANDAGES/DRESSINGS) ×3 IMPLANT
BANDAGE ESMARK 6X9 LF (GAUZE/BANDAGES/DRESSINGS) ×2 IMPLANT
BLADE AVERAGE 25X9 (BLADE) ×3 IMPLANT
BLADE MINI RND TIP GREEN BEAV (BLADE) IMPLANT
BLADE OSC/SAG .038X5.5 CUT EDG (BLADE) ×3 IMPLANT
BLADE SURG 15 STRL LF DISP TIS (BLADE) ×6 IMPLANT
BLADE SURG 15 STRL SS (BLADE) ×9
BNDG CMPR 9X4 STRL LF SNTH (GAUZE/BANDAGES/DRESSINGS)
BNDG CMPR 9X6 STRL LF SNTH (GAUZE/BANDAGES/DRESSINGS) ×2
BNDG COHESIVE 4X5 TAN STRL (GAUZE/BANDAGES/DRESSINGS) ×3 IMPLANT
BNDG ESMARK 4X9 LF (GAUZE/BANDAGES/DRESSINGS) IMPLANT
BNDG ESMARK 6X9 LF (GAUZE/BANDAGES/DRESSINGS) ×3
CHLORAPREP W/TINT 26ML (MISCELLANEOUS) ×3 IMPLANT
CLOTH BEACON ORANGE TIMEOUT ST (SAFETY) ×3 IMPLANT
COVER TABLE BACK 60X90 (DRAPES) ×3 IMPLANT
CUFF TOURNIQUET SINGLE 18IN (TOURNIQUET CUFF) IMPLANT
CUFF TOURNIQUET SINGLE 24IN (TOURNIQUET CUFF) ×3 IMPLANT
CUFF TOURNIQUET SINGLE 34IN LL (TOURNIQUET CUFF) IMPLANT
DRAPE EXTREMITY T 121X128X90 (DRAPE) ×3 IMPLANT
DRAPE OEC MINIVIEW 54X84 (DRAPES) ×3 IMPLANT
DRAPE U-SHAPE 47X51 STRL (DRAPES) ×3 IMPLANT
DRSG EMULSION OIL 3X3 NADH (GAUZE/BANDAGES/DRESSINGS) ×3 IMPLANT
DRSG PAD ABDOMINAL 8X10 ST (GAUZE/BANDAGES/DRESSINGS) ×3 IMPLANT
ELECT REM PT RETURN 9FT ADLT (ELECTROSURGICAL) ×3
ELECTRODE REM PT RTRN 9FT ADLT (ELECTROSURGICAL) ×2 IMPLANT
GAUZE SPONGE 4X4 16PLY XRAY LF (GAUZE/BANDAGES/DRESSINGS) IMPLANT
GLOVE BIO SURGEON STRL SZ8 (GLOVE) ×3 IMPLANT
GLOVE BIOGEL PI IND STRL 8 (GLOVE) ×2 IMPLANT
GLOVE BIOGEL PI INDICATOR 8 (GLOVE) ×1
GLOVE EXAM NITRILE MD LF STRL (GLOVE) ×2 IMPLANT
GOWN PREVENTION PLUS XLARGE (GOWN DISPOSABLE) ×3 IMPLANT
GOWN PREVENTION PLUS XXLARGE (GOWN DISPOSABLE) ×3 IMPLANT
GUIDEWIRE .08 (WIRE) ×3 IMPLANT
K-WIRE .045X4 (WIRE) IMPLANT
K-WIRE .054X4 (WIRE) ×6 IMPLANT
NEEDLE HYPO 22GX1.5 SAFETY (NEEDLE) IMPLANT
NEEDLE HYPO 25X1 1.5 SAFETY (NEEDLE) IMPLANT
NS IRRIG 1000ML POUR BTL (IV SOLUTION) ×3 IMPLANT
PACK BASIN DAY SURGERY FS (CUSTOM PROCEDURE TRAY) ×3 IMPLANT
PAD CAST 4YDX4 CTTN HI CHSV (CAST SUPPLIES) ×2 IMPLANT
PADDING CAST ABS 4INX4YD NS (CAST SUPPLIES)
PADDING CAST ABS COTTON 4X4 ST (CAST SUPPLIES) IMPLANT
PADDING CAST COTTON 4X4 STRL (CAST SUPPLIES) ×3
PENCIL BUTTON HOLSTER BLD 10FT (ELECTRODE) IMPLANT
SANITIZER HAND PURELL 535ML FO (MISCELLANEOUS) ×3 IMPLANT
SHEET MEDIUM DRAPE 40X70 STRL (DRAPES) ×3 IMPLANT
SLEEVE SCD COMPRESS KNEE MED (MISCELLANEOUS) ×3 IMPLANT
SPLINT FAST PLASTER 5X30 (CAST SUPPLIES)
SPLINT PLASTER CAST FAST 5X30 (CAST SUPPLIES) IMPLANT
SPONGE GAUZE 4X4 12PLY (GAUZE/BANDAGES/DRESSINGS) ×3 IMPLANT
SPONGE LAP 18X18 X RAY DECT (DISPOSABLE) ×3 IMPLANT
STOCKINETTE 6  STRL (DRAPES) ×1
STOCKINETTE 6 STRL (DRAPES) ×2 IMPLANT
SUCTION FRAZIER TIP 10 FR DISP (SUCTIONS) IMPLANT
SUT ETHILON 3 0 FSL (SUTURE) IMPLANT
SUT ETHILON 3 0 PS 1 (SUTURE) ×3 IMPLANT
SUT ETHILON 4 0 PS 2 18 (SUTURE) IMPLANT
SUT MNCRL AB 3-0 PS2 18 (SUTURE) ×3 IMPLANT
SUT PROLENE 3 0 PS 1 (SUTURE) IMPLANT
SUT VIC AB 0 SH 27 (SUTURE) IMPLANT
SUT VIC AB 2-0 PS2 27 (SUTURE) IMPLANT
SUT VIC AB 2-0 SH 27 (SUTURE) ×3
SUT VIC AB 2-0 SH 27XBRD (SUTURE) ×2 IMPLANT
SUT VIC AB 3-0 PS1 18 (SUTURE)
SUT VIC AB 3-0 PS1 18XBRD (SUTURE) IMPLANT
SUT VICRYL 4-0 PS2 18IN ABS (SUTURE) IMPLANT
SYR BULB 3OZ (MISCELLANEOUS) ×3 IMPLANT
SYR CONTROL 10ML LL (SYRINGE) IMPLANT
Screw Cannulated 2.3 x 30 MM (Screw) ×2 IMPLANT
TOWEL OR 17X24 6PK STRL BLUE (TOWEL DISPOSABLE) ×6 IMPLANT
TOWEL OR NON WOVEN STRL DISP B (DISPOSABLE) ×6 IMPLANT
TUBE CONNECTING 20X1/4 (TUBING) IMPLANT
UNDERPAD 30X30 INCONTINENT (UNDERPADS AND DIAPERS) ×3 IMPLANT
YANKAUER SUCT BULB TIP NO VENT (SUCTIONS) IMPLANT

## 2013-01-16 NOTE — Anesthesia Postprocedure Evaluation (Signed)
  Anesthesia Post-op Note  Patient: Kerri [name]  Procedure(s) Performed: Procedure(s): RIGHT Quintella Reichert OSTEOTOMY WITH SIMPLE BUNIONECTOMY AND SECOND HAMMER TOE CORRECTION (Right)  Patient Location: PACU  Anesthesia Type:GA combined with regional for post-op pain  Level of Consciousness: awake  Airway and Oxygen Therapy: Patient Spontanous Breathing and Patient connected to face mask oxygen  Post-op Pain: none  Post-op Assessment: Post-op Vital signs reviewed, Patient's Cardiovascular Status Stable, Respiratory Function Stable and Patent Airway  Post-op Vital Signs: Reviewed and stable  Complications: No apparent anesthesia complications

## 2013-01-16 NOTE — Brief Op Note (Signed)
01/16/2013  11:41 AM  PATIENT:  Kerri [name]  79 y.o. female  PRE-OPERATIVE DIAGNOSIS:  RIGHT HALLUX VALGUS and SECOND HAMMER TOE  POST-OPERATIVE DIAGNOSIS:  RIGHT HALLUX VALGUS and SECOND HAMMER TOE  Procedure(s): 1.  Right hallux akin osteotomy 2.  Right hallux simple bunionectomy 3.  Right 2nd toe hammertoe correction (PIP arthrodesis) 4.  AP and lateral radiographs of right foot  SURGEON:  Toni Arthurs, MD  ASSISTANT: n/a  ANESTHESIA:   General, regional  EBL:  minimal   TOURNIQUET:   Total Tourniquet Time Documented: Thigh (Right) - 35 minutes Total: Thigh (Right) - 35 minutes   COMPLICATIONS:  None apparent  DISPOSITION:  Extubated, awake and stable to recovery.  DICTATION ID:  [id number]

## 2013-01-16 NOTE — Anesthesia Preprocedure Evaluation (Addendum)
Anesthesia Evaluation  Patient identified by MRN, date of birth, ID band Patient awake    Reviewed: Allergy & Precautions, H&P , NPO status , Patient's Chart, lab work & pertinent test results  Airway Mallampati: II TM Distance: >3 FB Neck ROM: Full    Dental no notable dental hx. (+) Upper Dentures and Dental Advisory Given   Pulmonary neg pulmonary ROS,  breath sounds clear to auscultation  Pulmonary exam normal       Cardiovascular hypertension, Pt. on medications + CAD + dysrhythmias Rhythm:Regular Rate:Normal     Neuro/Psych PSYCHIATRIC DISORDERS Depression negative neurological ROS     GI/Hepatic Neg liver ROS, GERD-  Medicated and Controlled,  Endo/Other  Hypothyroidism   Renal/GU negative Renal ROS  negative genitourinary   Musculoskeletal   Abdominal   Peds  Hematology negative hematology ROS (+)   Anesthesia Other Findings   Reproductive/Obstetrics negative OB ROS                         Anesthesia Physical Anesthesia Plan  ASA: III  Anesthesia Plan: General and Regional   Post-op Pain Management:    Induction: Intravenous  Airway Management Planned: LMA  Additional Equipment:   Intra-op Plan:   Post-operative Plan: Extubation in OR  Informed Consent: I have reviewed the patients History and Physical, chart, labs and discussed the procedure including the risks, benefits and alternatives for the proposed anesthesia with the patient or authorized representative who has indicated his/her understanding and acceptance.   Dental advisory given  Plan Discussed with: CRNA  Anesthesia Plan Comments:         Anesthesia Quick Evaluation

## 2013-01-16 NOTE — Anesthesia Postprocedure Evaluation (Signed)
  Anesthesia Post-op Note  Patient: Kerri [name]  Procedure(s) Performed: Procedure(s): RIGHT Quintella Reichert OSTEOTOMY WITH SIMPLE BUNIONECTOMY AND SECOND HAMMER TOE CORRECTION (Right)  Patient Location: PACU  Anesthesia Type:GA combined with regional for post-op pain  Level of Consciousness: awake and alert   Airway and Oxygen Therapy: Patient Spontanous Breathing  Post-op Pain: none  Post-op Assessment: Post-op Vital signs reviewed, Patient's Cardiovascular Status Stable, Respiratory Function Stable, Patent Airway, No signs of Nausea or vomiting and Pain level controlled  Post-op Vital Signs: Reviewed and stable  Complications: No apparent anesthesia complications

## 2013-01-16 NOTE — Anesthesia Procedure Notes (Addendum)
Anesthesia Regional Block:  Popliteal block  Pre-Anesthetic Checklist: ,, timeout performed, Correct Patient, Correct Site, Correct Laterality, Correct Procedure, Correct Position, site marked, Risks and benefits discussed, pre-op evaluation, post-op pain management  Laterality: Right  Prep: Maximum Sterile Barrier Precautions used and chloraprep       Needles:  Injection technique: Single-shot  Needle Type: Echogenic Stimulator Needle      Needle Gauge: 21 and 21 G    Additional Needles:  Procedures: ultrasound guided (picture in chart) and nerve stimulator Popliteal block  Nerve Stimulator or Paresthesia:  Response: Peroneal,  Response: Tibial,   Additional Responses:   Narrative:  Start time: 01/16/2013 9:36 AM End time: 01/16/2013 9:48 AM Injection made incrementally with aspirations every 5 mL. Anesthesiologist: Sampson Goon, MD  Additional Notes: 2% Lidocaine skin wheel. Saphenous block with 10cc of 0.5% Bupivicaine plain.  Popliteal block Procedure Name: LMA Insertion Date/Time: 01/16/2013 10:42 AM Performed by: Dari Carpenito D Pre-anesthesia Checklist: Patient identified, Emergency Drugs available, Suction available and Patient being monitored Patient Re-evaluated:Patient Re-evaluated prior to inductionOxygen Delivery Method: Circle System Utilized Preoxygenation: Pre-oxygenation with 100% oxygen Intubation Type: IV induction Ventilation: Mask ventilation without difficulty LMA: LMA inserted LMA Size: 4.0 Number of attempts: 1 Airway Equipment and Method: bite block Placement Confirmation: positive ETCO2 Tube secured with: Tape Dental Injury: Teeth and Oropharynx as per pre-operative assessment

## 2013-01-16 NOTE — Transfer of Care (Signed)
Immediate Anesthesia Transfer of Care Note  Patient: Kerri [name]  Procedure(s) Performed: Procedure(s): RIGHT Quintella Reichert OSTEOTOMY WITH SIMPLE BUNIONECTOMY AND SECOND HAMMER TOE CORRECTION (Right)  Patient Location: PACU  Anesthesia Type:GA combined with regional for post-op pain  Level of Consciousness: awake  Airway & Oxygen Therapy: Patient Spontanous Breathing and Patient connected to face mask oxygen  Post-op Assessment: Report given to PACU RN and Post -op Vital signs reviewed and stable  Post vital signs: Reviewed and stable  Complications: No apparent anesthesia complications

## 2013-01-16 NOTE — H&P (Signed)
Kerri [name] is an 79 y.o. female.   Chief Complaint: right foot bunion and 2nd hammertoe HPI:  79 y/o female with right foot and painful 2nd hammertoe.  She presents now for operative treatment.    Past Medical History  Diagnosis Date  . CAD (coronary artery disease)     Chest CTA 10/07: 40% or less pLAD;  ETT 8/09: negative  . Dyslipidemia   . HTN (hypertension)   . DJD (degenerative joint disease)   . Hypothyroidism   . Melanoma     metastatic; left leg; s/p multiple excisions  . Carotid bruit   . Spinal stenosis   . Chronic back pain   . Osteoporosis   . GERD (gastroesophageal reflux disease)   . IBS (irritable bowel syndrome)   . Depression     Past Surgical History  Procedure Laterality Date  . Melanoma excision    . Knee arthroscopy    . Shoulder surgery    . Carpal tunnel release    . Toe surgery    . Back surgery    . Total hip arthroplasty  09/04/2011    Procedure: TOTAL HIP ARTHROPLASTY;  Surgeon: Loanne Drilling, MD;  Location: [facility];  Service: Orthopedics;  Laterality: Left;  Marland Kitchen Melanoma excision Left 09/19/2012    Procedure: WIDE EXCISION METASTATIC MELANOMA LEFT MEDIAL THIGH;  Surgeon: Liz Malady, MD;  Location: [facility];  Service: General;  Laterality: Left;    History reviewed. No pertinent family history. Social History:  reports that she quit smoking about 64 years ago. She has never used smokeless tobacco. She reports that  drinks alcohol. She reports that she does not use illicit drugs.  Allergies:  Allergies  Allergen Reactions  . Codeine Nausea And Vomiting    Patient gets sick on stomach  . Morphine And Related Itching    Happened a while back. Patient noted that any narcotic based medication makes her have a bad reaction.  . Tramadol Nausea And Vomiting and Other (See Comments)    confusion  . Adhesive [Tape] Other (See Comments)    Tears skin   . Hydrocodone-Acetaminophen Nausea And Vomiting  . Lactose Intolerance (Gi)  Other (See Comments)    Gi upset   . Oxycodone Nausea And Vomiting    Medications Prior to Admission  Medication Sig Dispense Refill  . esomeprazole (NEXIUM) 40 MG capsule Take 40 mg by mouth daily before breakfast.       . hydrochlorothiazide (MICROZIDE) 12.5 MG capsule Take 12.5 mg by mouth daily after breakfast.       . hydroxypropyl methylcellulose (ISOPTO TEARS) 2.5 % ophthalmic solution Place 1 drop into both eyes 4 (four) times daily as needed. Dry eyes      . levothyroxine (SYNTHROID, LEVOTHROID) 75 MCG tablet Take 75 mcg by mouth daily with breakfast.      . loratadine (CLARITIN) 10 MG tablet Take 10 mg by mouth daily.       . Magnesium 500 MG TABS Take 500 mg by mouth daily.      Marland Kitchen OLUX-E 0.05 % topical foam Apply 1 application topically 2 (two) times daily as needed. To scalp for Itching      . polyethylene glycol powder (GLYCOLAX/MIRALAX) powder Take 17 g by mouth daily.      Marland Kitchen RETIN-A 0.1 % cream Apply 1 application topically 2 (two) times a week.       . rosuvastatin (CRESTOR) 10 MG tablet Take 10 mg by mouth  at bedtime.       . sertraline (ZOLOFT) 50 MG tablet Take 50 mg by mouth Daily.       Marland Kitchen VITAMIN D, CHOLECALCIFEROL, PO Take 1 tablet by mouth daily.      Marland Kitchen LIDODERM 5 % Place 1 patch onto the skin daily as needed. Pain        Results for orders placed during the hospital encounter of 01/22/13 (from the past 48 hour(s))  BASIC METABOLIC PANEL     Status: Abnormal   Collection Time    01/14/13  1:00 PM      Result Value Range   Sodium 139  135 - 145 mEq/L   Potassium 4.4  3.5 - 5.1 mEq/L   Chloride 102  96 - 112 mEq/L   CO2 27  19 - 32 mEq/L   Glucose, Bld 84  70 - 99 mg/dL   BUN 18  6 - 23 mg/dL   Creatinine, Ser 8.11  0.50 - 1.10 mg/dL   Calcium 9.7  8.4 - 91.4 mg/dL   GFR calc non Af Amer 76 (*) >90 mL/min   GFR calc Af Amer 88 (*) >90 mL/min   Comment: (NOTE)     The eGFR has been calculated using the CKD EPI equation.     This calculation has not been  validated in all clinical situations.     eGFR's persistently <90 mL/min signify possible Chronic Kidney     Disease.  POCT HEMOGLOBIN-HEMACUE     Status: None   Collection Time    01/22/13  9:42 AM      Result Value Range   Hemoglobin 12.6  12.0 - 15.0 g/dL   No results found.  ROS  No recent f/c/n/v/wt loss  Blood pressure 129/59, pulse 72, temperature 97.9 F (36.6 C), temperature source Oral, resp. rate 10, height 5\' 5"  (1.651 m), weight 60.782 kg (134 lb), SpO2 96.00%. Physical Exam  Thin elderly female in nad.  A and O.  EOMI.  Resp unlabored.  R foot with 2nd hammertoe and bunion deformities.  Skin healthy and intact.  5.5 strength in pf and df of toes and ankle.  sens to LT intact at the foot.  No lymphadenopathy.  Assessment/Plan R foot bunion and 2nd hammertoe deformities - to OR for bunion and 2nd hammertoe correction.  The risks and benefits of the alternative treatment options have been discussed in detail.  The patient wishes to proceed with surgery and specifically understands risks of bleeding, infection, nerve damage, blood clots, need for additional surgery, amputation and death.   Toni Arthurs 22-Jan-2013, 10:21 AM

## 2013-01-16 NOTE — Progress Notes (Signed)
Assisted Dr. Fitzgerald with right, ultrasound guided, popliteal/saphenous block. Side rails up, monitors on throughout procedure. See vital signs in flow sheet. Tolerated Procedure well. 

## 2013-01-16 NOTE — Op Note (Signed)
Kerri [name], Kerri [name] NO.:  [id number]  MEDICAL RECORD NO.:  [id number]  LOCATION:                                 FACILITY:  PHYSICIAN:  Toni Arthurs, MD             DATE OF BIRTH:  DATE OF PROCEDURE:  01/16/2013 DATE OF DISCHARGE:                              OPERATIVE REPORT   PREOPERATIVE DIAGNOSES: 1. Right hallux valgus. 2. Right second hammertoe deformity.  POSTOPERATIVE DIAGNOSES: 1. Right hallux valgus. 2. Right second hammertoe deformity.  PROCEDURE: 1. Right hallux Akin osteotomy. 2. Right hallux simple bunionectomy. 3. Right second hammertoe correction (PIP arthrodesis). 4. AP and lateral radiographs of the right foot.  SURGEON:  Toni Arthurs, MD.  ANESTHESIA:  General, regional.  ESTIMATED BLOOD LOSS:  Minimal.  TOURNIQUET TIME:  35 minutes at 220 mmHg.  COMPLICATIONS:  None apparent.  DISPOSITION:  Extubated, awake, and stable to recovery.  INDICATIONS FOR PROCEDURE:  The patient is a an 79 year old female with painful right foot bunion and second hammertoe deformity.  She has failed nonoperative treatment including activity modification, shoe wear modification as well as oral anti-inflammatories.  She presents now for operative treatment of this condition.  She understands the risks and benefits, the alternative treatment options and elects surgical treatment.  She specifically understands risks of bleeding, infection, nerve damage, blood clots, need for additional surgery, amputation, and death.  PROCEDURE IN DETAIL:  After preoperative consent was obtained, the correct operative site was identified.  The patient was brought to the operating room and placed supine on the operating table.  General anesthesia was induced.  Preoperative antibiotics were administered. Surgical time-out was taken.  The right lower extremity was prepped and draped in standard sterile fashion with a tourniquet around the thigh. The foot was  exsanguinated and tourniquet was inflated to 220 mmHg.  A longitudinal incision was then made over the medial eminence.  Sharp dissection was carried down through the skin and subcutaneous tissue. Medial joint capsule was incised longitudinally and elevated plantarly and dorsally.  An oscillating saw was then used to resect the medial eminence in line with first metatarsal shaft.  The wound was irrigated. The base of the proximal phalanx was exposed.  The extensor flexor tendons were protected.  A K-wire was inserted transverse to the proximal phalanx shaft.  An Akin osteotomy was then made with an oscillating saw, removing a small wedge of bone from the medial aspect of the phalanx.  The waste bone was removed with a small curette.  The wound was irrigated.  The osteotomy was closed down correcting the hallux valgus interphalangeus.  K-wire was removed.  A K-wire was then placed obliquely across the osteotomy site.  A 2.3-mm partially threaded cannulated screw was then inserted over the K-wire and compressed the osteotomy site appropriately.  AP and lateral radiograph showed appropriate correction of the hallux valgus interphalangeus as well as appropriate compression of the osteotomy site.  Wound was irrigated. Imbricating sutures of 2-0 Vicryl were used to close the joint capsule over the medial eminence.  Inverted simple sutures of 3-0 Monocryl were used to close subcutaneous  tissue and running 3-0 nylon was used to close the skin incision.  Attention was then turned to the 2nd hammertoe deformity.  A transverse incision was made over the PIP joint.  Sharp dissection was carried down through the skin, subcutaneous tissue, and extensor mechanism.  The head of the proximal phalanx was resected with the oscillating saw along with the base of the middle phalanx.  The joint was reduced.  A 0.054 K-wire was then introduced across the IP joint, across the MP joint holding the toe in a  reduced position.  A pin was bent, trimmed, and capped.  Final AP and lateral radiograph showed appropriate position and length of all hardware and appropriate correction of the bunion and second hammertoe deformities.  Her dorsal wound was closed with horizontal mattress sutures of 2-0 nylon.  Sterile dressings were applied followed by compression wrap.  Tourniquet was released at 35 minutes after application of the dressings.  The patient was then awakened from anesthesia and transported to recovery room in stable condition.  FOLLOWUP PLAN:  The patient will be weightbearing as tolerated on her right foot in a hard-sole shoe.  She will follow up with me in 2 weeks for suture removal.  Radiographs AP and lateral radiographs of the foot were obtained intraoperatively today.  These show interval correction of her hallux valgus and second hammertoe deformities with appropriately positioned hardware.  No acute fracture, dislocation is noted.     Toni Arthurs, MD     JH/MEDQ  D:  01/16/2013  T:  01/16/2013  Job:  [id number]

## 2013-01-17 ENCOUNTER — Encounter (HOSPITAL_BASED_OUTPATIENT_CLINIC_OR_DEPARTMENT_OTHER): Payer: Self-pay | Admitting: Orthopedic Surgery

## 2013-01-21 ENCOUNTER — Telehealth (INDEPENDENT_AMBULATORY_CARE_PROVIDER_SITE_OTHER): Payer: Self-pay

## 2013-01-21 NOTE — Telephone Encounter (Signed)
I received a message to call the pt.  I called and she states she has a new suspicious lump on her thigh near her skin graft site.  I scheduled her to come in for evaluation and possible removal in the office on 9/17.

## 2013-02-05 ENCOUNTER — Ambulatory Visit (INDEPENDENT_AMBULATORY_CARE_PROVIDER_SITE_OTHER): Payer: Medicare Other | Admitting: General Surgery

## 2013-02-05 ENCOUNTER — Encounter (INDEPENDENT_AMBULATORY_CARE_PROVIDER_SITE_OTHER): Payer: Self-pay | Admitting: General Surgery

## 2013-02-05 VITALS — BP 102/64 | HR 72 | Resp 14 | Ht 65.0 in | Wt 131.0 lb

## 2013-02-05 DIAGNOSIS — Z85820 Personal history of malignant melanoma of skin: Secondary | ICD-10-CM

## 2013-02-05 DIAGNOSIS — L988 Other specified disorders of the skin and subcutaneous tissue: Secondary | ICD-10-CM

## 2013-02-05 DIAGNOSIS — C4372 Malignant melanoma of left lower limb, including hip: Secondary | ICD-10-CM

## 2013-02-05 NOTE — Progress Notes (Signed)
Subjective:     Patient ID: Kerri [name], female   DOB: [birth year], 79 y.o.   MRN: [id number]  HPI Patient has a history of malignant melanoma left shin with several in transit 5 metastases. She noticed a subcutaneous nodule just medial to her skin graft site and comes for evaluation. There has been no pain.  Review of Systems     Objective:   Physical Exam  Constitutional: She is oriented to person, place, and time. She appears well-developed and well-nourished. No distress.  Neck: Normal range of motion. Neck supple.  Cardiovascular: Normal rate and regular rhythm.   Pulmonary/Chest: Effort normal. No respiratory distress. She has no wheezes.  Neurological: She is alert and oriented to person, place, and time.   Left thigh previous biopsies sites are palpated without nodularity or abnormality. There is a vague  subcutaneous mass just medial to her skin graft harvest site.   Procedure: Consent obtained. Area was prepped in a sterile fashion. 1% lidocaine with epinephrine was injected. An ellipse of skin was taken out and there was a small white nodule along there that was soft. Palpation of underlying soft tissue revealed a vague nodule. This was circumferentially dissected and removed. This was sent to pathology. The other tissue was also sent to pathology. Further surrounding adipose tissue was removed. Hemostasis was obtained with pressure and the skin was closed with interrupted 4-0 nylons.Dressing was applied she tolerated well.     Assessment:   History of metastatic melanoma with new nodule left Thigh    Plan:     This nodule was excised as above. Will check pathology. Return in 2 weeks for suture removal.

## 2013-02-07 ENCOUNTER — Telehealth (INDEPENDENT_AMBULATORY_CARE_PROVIDER_SITE_OTHER): Payer: Self-pay | Admitting: General Surgery

## 2013-02-07 NOTE — Telephone Encounter (Signed)
Called patient and discussed her pathology findings which were benign

## 2013-02-19 ENCOUNTER — Encounter (INDEPENDENT_AMBULATORY_CARE_PROVIDER_SITE_OTHER): Payer: Self-pay | Admitting: General Surgery

## 2013-02-19 ENCOUNTER — Ambulatory Visit (INDEPENDENT_AMBULATORY_CARE_PROVIDER_SITE_OTHER): Payer: Medicare Other | Admitting: General Surgery

## 2013-02-19 VITALS — BP 98/60 | HR 60 | Temp 96.7°F | Resp 14 | Ht 65.0 in | Wt 132.0 lb

## 2013-02-19 DIAGNOSIS — C4372 Malignant melanoma of left lower limb, including hip: Secondary | ICD-10-CM

## 2013-02-19 DIAGNOSIS — C4370 Malignant melanoma of unspecified lower limb, including hip: Secondary | ICD-10-CM

## 2013-02-19 NOTE — Progress Notes (Signed)
Subjective:     Patient ID: Kerri [name], female   DOB: 10-21-1929, 79 y.o.   MRN: [id number]  HPI Patient underwent excision mass left upper thigh. Pathology was fortunately, benign.  Review of Systems     Objective:   Physical Exam Sutures were removed. Wound is healed nicely. Steri-Strips were placed with benzoin.    Assessment:     History of melanoma left shin with regional metastases, doing well status post excision of mass left upper thigh which was benign    Plan:     Return when necessary

## 2013-04-02 ENCOUNTER — Other Ambulatory Visit: Payer: Self-pay

## 2013-04-02 DIAGNOSIS — Z1231 Encounter for screening mammogram for malignant neoplasm of breast: Secondary | ICD-10-CM

## 2013-04-03 ENCOUNTER — Telehealth: Payer: Self-pay | Admitting: Cardiology

## 2013-04-03 NOTE — Telephone Encounter (Signed)
Per pt call - states all day yesterday her HR was 52 and she was dizzy.  This am her HR was 60 and she had a h/a and dizzy and now it is 70.  This is the only time since she was seen last that her HR has been low. States her daughter thinks she needs a pacemaker.  She would like to know what Dr Antoine Poche thinks about this.  Aware I will forward this information to Dr Antoine Poche for review.

## 2013-04-03 NOTE — Telephone Encounter (Signed)
New message     Pulse is running low in the 50's---it is usually in the 70"s or 80's.   She has a headache but no other symptons.  Should she come in and see Dr Antoine Poche?

## 2013-04-04 NOTE — Telephone Encounter (Signed)
NA

## 2013-04-04 NOTE — Telephone Encounter (Signed)
A heart rate in the 50s would rarely require a pacemaker.  There are many causes of dizziness.  I might first suggest an appt with her primary provider and we can see her if she has persistent dizziness.

## 2013-04-04 NOTE — Telephone Encounter (Signed)
Pt aware to follow up with her PCP.  Today is states her BP has been elevated 145/89 and HR 68.  She will talk to her daughter about what she feels is best (she is a Engineer, civil (consulting)) as for as follow up and will call back if she wants an appointment here.

## 2013-04-09 ENCOUNTER — Encounter (INDEPENDENT_AMBULATORY_CARE_PROVIDER_SITE_OTHER): Payer: Self-pay | Admitting: General Surgery

## 2013-04-09 ENCOUNTER — Ambulatory Visit (INDEPENDENT_AMBULATORY_CARE_PROVIDER_SITE_OTHER): Payer: Medicare Other | Admitting: General Surgery

## 2013-04-09 VITALS — BP 110/64 | HR 70 | Temp 97.2°F | Resp 14 | Ht 65.0 in | Wt 133.0 lb

## 2013-04-09 DIAGNOSIS — C4370 Malignant melanoma of unspecified lower limb, including hip: Secondary | ICD-10-CM

## 2013-04-09 DIAGNOSIS — C4372 Malignant melanoma of left lower limb, including hip: Secondary | ICD-10-CM

## 2013-04-09 NOTE — Progress Notes (Signed)
Subjective:     Patient ID: Kerri [name], female   DOB: 06/12/1929, 79 y.o.   MRN: [id number]  HPI Patient's history of melanoma left lower extremity with multiple regional metastases of her left thigh. She thought she felt another lesion just lateral to her split thickness skin graft excision site on her upper thigh.  Review of Systems     Objective:   Physical Exam Awake and alert Heart regular with occasional ectopy Lungs clear to auscultation Left thigh region of question was fully palpated and I do not feel any discrete nodules or suspicious lesions. There no wounds on her skin. The area of previous biopsy along the medial border of her split thickness skin graft harvest site is well-healed. I do not find any suspicious nodules    Assessment:     No new nodules    Plan:     Patient will continue surveillance as she does very well. I will see her back should she find anything else suspicious.

## 2013-04-14 ENCOUNTER — Other Ambulatory Visit (INDEPENDENT_AMBULATORY_CARE_PROVIDER_SITE_OTHER): Payer: Self-pay | Admitting: General Surgery

## 2013-04-14 ENCOUNTER — Telehealth (INDEPENDENT_AMBULATORY_CARE_PROVIDER_SITE_OTHER): Payer: Self-pay

## 2013-04-14 DIAGNOSIS — C4372 Malignant melanoma of left lower limb, including hip: Secondary | ICD-10-CM

## 2013-04-14 NOTE — Telephone Encounter (Signed)
The pt is calling about having a headache and back pain that is constant for over a week.  She takes Aleve but nothing seems to relieve it.  Her back pain is in her lower back not quite half way up her back.  She wants a PET scan done.  She wants Dr Janee Morn to order it.  She said if he can't maybe he can call Dr Truett Perna for her.  I told her I don't think he can but I will ask him.

## 2013-04-15 ENCOUNTER — Other Ambulatory Visit (INDEPENDENT_AMBULATORY_CARE_PROVIDER_SITE_OTHER): Payer: Self-pay

## 2013-04-15 ENCOUNTER — Telehealth (INDEPENDENT_AMBULATORY_CARE_PROVIDER_SITE_OTHER): Payer: Self-pay | Admitting: *Deleted

## 2013-04-15 DIAGNOSIS — C4372 Malignant melanoma of left lower limb, including hip: Secondary | ICD-10-CM

## 2013-04-15 NOTE — Telephone Encounter (Signed)
I spoke with pt and informed her that Dr. Janee Morn ordered for her to have a PET scan done.  I informed her of the appt at [facility] on 12/10 with an arrival time of 1:45pm.  Instructed pt to be NPO after 8:00am (water only).  Pt is agreeable and thankful for this appt.

## 2013-04-22 ENCOUNTER — Telehealth (INDEPENDENT_AMBULATORY_CARE_PROVIDER_SITE_OTHER): Payer: Self-pay

## 2013-04-22 DIAGNOSIS — C4372 Malignant melanoma of left lower limb, including hip: Secondary | ICD-10-CM

## 2013-04-22 NOTE — Telephone Encounter (Signed)
Patient called to state she wants to cancel her PET scan and have a CT of her head.  Dr Ethelene Hal said it would show up better if she has brain cancer.  The pt states she has severe headaches and was treated for a sinus infection but it didn't go away.  I gave her the # to scheduling so she can cancel her PET.  I told her we will let her know about the CT scan

## 2013-04-23 ENCOUNTER — Telehealth (INDEPENDENT_AMBULATORY_CARE_PROVIDER_SITE_OTHER): Payer: Self-pay | Admitting: *Deleted

## 2013-04-23 NOTE — Telephone Encounter (Signed)
I called pt and informed her of the appt for her CT scan at [facility] on 12/5 with an arrival time of 11:30am.  Instructed her to have no solid foods 4 hours prior to scan.  Pt is agreeable with this appt and instructions given.

## 2013-04-23 NOTE — Telephone Encounter (Signed)
Dr Janee Morn authorized a CT head.  We will arrange.

## 2013-04-23 NOTE — Addendum Note (Signed)
Addended byIvory Broad on: 04/23/2013 01:35 PM   Modules accepted: Orders

## 2013-04-24 ENCOUNTER — Ambulatory Visit
Admission: RE | Admit: 2013-04-24 | Discharge: 2013-04-24 | Disposition: A | Discharge status: Home / Self Care | Payer: Medicare Other | Source: Ambulatory Visit

## 2013-04-24 DIAGNOSIS — Z1231 Encounter for screening mammogram for malignant neoplasm of breast: Secondary | ICD-10-CM

## 2013-04-25 ENCOUNTER — Ambulatory Visit
Admission: RE | Admit: 2013-04-25 | Discharge: 2013-04-25 | Disposition: A | Discharge status: Home / Self Care | Payer: Medicare Other | Source: Ambulatory Visit | Attending: General Surgery | Admitting: General Surgery

## 2013-04-25 DIAGNOSIS — C4372 Malignant melanoma of left lower limb, including hip: Secondary | ICD-10-CM

## 2013-04-25 MED ORDER — IOHEXOL 300 MG/ML  SOLN
75.0000 mL | Freq: Once | INTRAMUSCULAR | Status: AC | PRN
  Administered 2013-04-25: 75 mL via INTRAVENOUS

## 2013-04-28 ENCOUNTER — Telehealth (INDEPENDENT_AMBULATORY_CARE_PROVIDER_SITE_OTHER): Payer: Self-pay | Admitting: General Surgery

## 2013-04-28 NOTE — Telephone Encounter (Signed)
I called her and discussed her CT head results.

## 2013-04-30 ENCOUNTER — Encounter (HOSPITAL_COMMUNITY): Payer: Medicare Other

## 2013-07-25 ENCOUNTER — Telehealth (INDEPENDENT_AMBULATORY_CARE_PROVIDER_SITE_OTHER): Payer: Self-pay | Admitting: General Surgery

## 2013-07-25 NOTE — Telephone Encounter (Signed)
Pt called to ask if and when she went to get imaging of her thigh; ordered by Dr. Grandville Silos.  Reviewed her records and informed pt the MR was ordered January of 2013, but she did not go to the appt.  Pt states she is currently seeing "a doctor for my hip" and that MD wanted to get imaging, but she thought it had already been done.  Encouraged pt to follow up with the orthopedic physician and get the imaging there.

## 2013-07-30 ENCOUNTER — Other Ambulatory Visit (HOSPITAL_COMMUNITY): Payer: Self-pay | Admitting: Neurosurgery

## 2013-07-30 DIAGNOSIS — M25552 Pain in left hip: Secondary | ICD-10-CM

## 2013-07-30 DIAGNOSIS — M79652 Pain in left thigh: Secondary | ICD-10-CM

## 2013-08-06 ENCOUNTER — Ambulatory Visit (INDEPENDENT_AMBULATORY_CARE_PROVIDER_SITE_OTHER): Payer: Medicare Other | Admitting: General Surgery

## 2013-08-06 ENCOUNTER — Encounter (INDEPENDENT_AMBULATORY_CARE_PROVIDER_SITE_OTHER): Payer: Self-pay | Admitting: General Surgery

## 2013-08-06 VITALS — BP 120/80 | HR 84 | Temp 98.0°F | Resp 15 | Ht 65.25 in | Wt 139.0 lb

## 2013-08-06 DIAGNOSIS — C4370 Malignant melanoma of unspecified lower limb, including hip: Secondary | ICD-10-CM

## 2013-08-06 NOTE — Progress Notes (Signed)
Subjective:     Patient ID: Kerri [name], female   DOB: Mar 09, 1930, 80 y.o.   MRN: [id number]  HPI  Patient has a history of melanoma of the left shin with nodal metastases. She has had several in transit metastases removed from her left thigh in the office. She developed a small wound at her initial site. She is covering it with a Band-Aid. She has been wearing a very tight stocking in the area. Recently she has developed significant neuropathic-type pain in her entire left leg. She has chronic back problems and has had previous back surgery. She is being followed by Dr. Earle Gell at [facility] and a bone scan is pending. Review of Systems     Objective:   Physical Exam  Constitutional: She appears well-developed and well-nourished.  HENT:  Head: Normocephalic.  Neck: Neck supple.  Cardiovascular: Normal rate and normal heart sounds.   Pulmonary/Chest: Effort normal and breath sounds normal. No respiratory distress. She has no wheezes.  Abdominal: Soft. She exhibits no distension. There is no tenderness.  Musculoskeletal:  Left she then has small open wound with clean granulation tissue 1 cm in size with no associated pigmentation or nodularity. No infection. thigh has no discrete nodules. She has stable scar tissue from lymphadenectomy and other biopsy sites. No new nodules are felt.  The medial aspect of her right thumb along the nail has a small wound she says she has been taking at it with her cuticle tool, no evidence of infection       Assessment:     Small wound left shin is likely due to local irritation, Neuropathic pain workup is ongoing per neurosurgery    Plan:     Provided small wound on her shin heals within a week or 2, there is no concern. I will see her back if it fails to do that. In the interim her bone scan is scheduled for this week and I will review that with Dr. Arnoldo Morale.

## 2013-08-07 ENCOUNTER — Encounter (HOSPITAL_COMMUNITY)
Admission: RE | Admit: 2013-08-07 | Discharge: 2013-08-07 | Disposition: A | Discharge status: Home / Self Care | Payer: Medicare Other | Source: Ambulatory Visit | Attending: Neurosurgery | Admitting: Neurosurgery

## 2013-08-07 ENCOUNTER — Ambulatory Visit (HOSPITAL_COMMUNITY)
Admission: RE | Admit: 2013-08-07 | Discharge: 2013-08-07 | Disposition: A | Discharge status: Home / Self Care | Payer: Medicare Other | Source: Ambulatory Visit | Attending: Neurosurgery | Admitting: Neurosurgery

## 2013-08-07 DIAGNOSIS — M25559 Pain in unspecified hip: Secondary | ICD-10-CM | POA: Insufficient documentation

## 2013-08-07 DIAGNOSIS — M25552 Pain in left hip: Secondary | ICD-10-CM

## 2013-08-07 DIAGNOSIS — M79652 Pain in left thigh: Secondary | ICD-10-CM

## 2013-08-07 MED ORDER — TECHNETIUM TC 99M MEDRONATE IV KIT
25.0000 | PACK | Freq: Once | INTRAVENOUS | Status: AC | PRN
  Administered 2013-08-07: 25 via INTRAVENOUS

## 2013-08-08 ENCOUNTER — Telehealth (INDEPENDENT_AMBULATORY_CARE_PROVIDER_SITE_OTHER): Payer: Self-pay | Admitting: General Surgery

## 2013-08-08 NOTE — Telephone Encounter (Signed)
I D/W Dr. Arnoldo Morale. I called her and gave her her NM bone scan results.

## 2013-10-14 ENCOUNTER — Encounter: Payer: Self-pay | Admitting: Cardiology

## 2013-10-14 ENCOUNTER — Ambulatory Visit (INDEPENDENT_AMBULATORY_CARE_PROVIDER_SITE_OTHER): Payer: Medicare Other | Admitting: Cardiology

## 2013-10-14 VITALS — BP 112/74 | HR 70 | Ht 65.0 in | Wt 137.0 lb

## 2013-10-14 DIAGNOSIS — I2510 Atherosclerotic heart disease of native coronary artery without angina pectoris: Secondary | ICD-10-CM

## 2013-10-14 DIAGNOSIS — I10 Essential (primary) hypertension: Secondary | ICD-10-CM

## 2013-10-14 NOTE — Patient Instructions (Signed)
The current medical regimen is effective;  continue present plan and medications.  Follow up in 1 year with Dr Hochrein.  You will receive a letter in the mail 2 months before you are due.  Please call us when you receive this letter to schedule your follow up appointment.  

## 2013-10-14 NOTE — Progress Notes (Signed)
HPI The patient presents for preoperative evaluation.  I have seen her in the past for palpitations and HTN.  She has had mild coronary disease.  She is being considered for back surgery.  She has had no recent cardiac symptoms. She came back today to talk about this. She's had no presyncope or syncope. She denies any chest pressure, neck or arm discomfort. She's had no new shortness of breath, PND or orthopnea.  She still is able to walk and do her shopping with her cain although she is more limited by back pain.    Allergies  Allergen Reactions  . Codeine Nausea And Vomiting    Patient gets sick on stomach  . Morphine And Related Itching    Happened a while back. Patient noted that any narcotic based medication makes her have a bad reaction.  . Tramadol Nausea And Vomiting and Other (See Comments)    confusion  . Adhesive [Tape] Other (See Comments)    Tears skin   . Hydrocodone-Acetaminophen Nausea And Vomiting  . Lactose Intolerance (Gi) Other (See Comments)    Gi upset   . Oxycodone Nausea And Vomiting    Current Outpatient Prescriptions  Medication Sig Dispense Refill  . esomeprazole (NEXIUM) 40 MG capsule Take 40 mg by mouth daily before breakfast.       . hydrochlorothiazide (MICROZIDE) 12.5 MG capsule Take 12.5 mg by mouth daily after breakfast.       . hydroxypropyl methylcellulose (ISOPTO TEARS) 2.5 % ophthalmic solution Place 1 drop into both eyes 4 (four) times daily as needed. Dry eyes      . levothyroxine (SYNTHROID, LEVOTHROID) 75 MCG tablet Take 75 mcg by mouth daily with breakfast.      . LIDODERM 5 % Place 1 patch onto the skin daily as needed. Pain      . loratadine (CLARITIN) 10 MG tablet Take 10 mg by mouth daily.       . Magnesium 500 MG TABS Take 500 mg by mouth daily.      Marland Kitchen OLUX-E 0.05 % topical foam Apply 1 application topically 2 (two) times daily as needed. To scalp for Itching      . polyethylene glycol powder (GLYCOLAX/MIRALAX) powder Take 17 g by  mouth daily.      Marland Kitchen RETIN-A 0.1 % cream Apply 1 application topically 2 (two) times a week.       . rosuvastatin (CRESTOR) 10 MG tablet Take 10 mg by mouth at bedtime.       . sertraline (ZOLOFT) 50 MG tablet Take 50 mg by mouth Daily.       . tapentadol (NUCYNTA) 50 MG TABS tablet Take 1 tablet (50 mg total) by mouth every 6 (six) hours as needed.  30 tablet  0  . VITAMIN D, CHOLECALCIFEROL, PO Take 1 tablet by mouth daily.       No current facility-administered medications for this visit.    Past Medical History  Diagnosis Date  . CAD (coronary artery disease)     Chest CTA 10/07: 40% or less pLAD;  ETT 8/09: negative  . Dyslipidemia   . HTN (hypertension)   . DJD (degenerative joint disease)   . Hypothyroidism   . Melanoma     metastatic; left leg; s/p multiple excisions  . Carotid bruit   . Spinal stenosis   . Chronic back pain   . Osteoporosis   . GERD (gastroesophageal reflux disease)   . IBS (irritable bowel syndrome)   .  Depression     Past Surgical History  Procedure Laterality Date  . Melanoma excision    . Knee arthroscopy    . Shoulder surgery    . Carpal tunnel release    . Toe surgery    . Back surgery    . Total hip arthroplasty  09/04/2011    Procedure: TOTAL HIP ARTHROPLASTY;  Surgeon: Gearlean Alf, MD;  Location: [facility];  Service: Orthopedics;  Laterality: Left;  Marland Kitchen Melanoma excision Left 09/19/2012    Procedure: WIDE EXCISION METASTATIC MELANOMA LEFT MEDIAL THIGH;  Surgeon: Zenovia Jarred, MD;  Location: [place];  Service: General;  Laterality: Left;  . Bunionectomy with hammertoe reconstruction Right 01/16/2013    Procedure: RIGHT Barbie Banner OSTEOTOMY WITH SIMPLE BUNIONECTOMY AND SECOND HAMMER TOE CORRECTION;  Surgeon: Wylene Simmer, MD;  Location: [place];  Service: Orthopedics;  Laterality: Right;    ROS:  As stated in the HPI and negative for all other systems.  PHYSICAL EXAM BP 112/74  Pulse 70  Ht 5\' 5"  (1.651 m)   Wt 137 lb (62.143 kg)  BMI 22.80 kg/m2 GENERAL:  Well appearing LUNGS:  Clear to auscultation bilaterally CHEST:  Unremarkable HEART:  PMI not displaced or sustained,S1 and S2 within normal limits, no S3, no S4, no clicks, no rubs, no murmurs ABD:  Flat, positive bowel sounds normal in frequency in pitch, no bruits, no rebound, no guarding, no midline pulsatile mass, no hepatomegaly, no splenomegaly EXT:  2 plus pulses throughout, no edema, no cyanosis no clubbing   EKG:  NSR, rate 59, axis WNL, intervals WNL, PVCs, PACS, no acute ST T wave changes.  10/14/2013  ASSESSMENT AND PLAN   HYPERTENSION  Her blood pressure today is well controlled.  We reviewed this again. No change in therapy is indicated.   PREMATURE BEATS  She did have somewhat frequent ectopy in the past.  However, there were no sustained arrhythmias. She has no significant symptoms and has not felt any of this recently. Therefore, no change in therapy is indicated.  PREOP The patient has a high functional level.  She has had no high risk findings or symptoms.   She is not going for a high risk surgery.  Therefore, based on ACC/AHA guidelines, the patient would be at acceptable risk for the planned procedure without further cardiovascular testing.

## 2013-10-16 ENCOUNTER — Other Ambulatory Visit: Payer: Self-pay | Admitting: Neurosurgery

## 2013-10-29 ENCOUNTER — Telehealth (INDEPENDENT_AMBULATORY_CARE_PROVIDER_SITE_OTHER): Payer: Self-pay

## 2013-10-29 NOTE — Telephone Encounter (Signed)
Pt called to let Dr Grandville Silos know she will be at [facility] for surgery on 6/25. She would like for you to stop by and see her if you are available.

## 2013-10-31 ENCOUNTER — Encounter (HOSPITAL_COMMUNITY): Payer: Self-pay | Admitting: Pharmacy Technician

## 2013-11-03 ENCOUNTER — Encounter (HOSPITAL_COMMUNITY)
Admission: RE | Admit: 2013-11-03 | Discharge: 2013-11-03 | Disposition: A | Discharge status: Home / Self Care | Payer: Medicare Other | Source: Ambulatory Visit | Attending: Anesthesiology | Admitting: Anesthesiology

## 2013-11-03 ENCOUNTER — Encounter (HOSPITAL_COMMUNITY)
Admission: RE | Admit: 2013-11-03 | Discharge: 2013-11-03 | Disposition: A | Discharge status: Home / Self Care | Payer: Medicare Other | Source: Ambulatory Visit | Attending: Neurosurgery | Admitting: Neurosurgery

## 2013-11-03 ENCOUNTER — Encounter (HOSPITAL_COMMUNITY): Payer: Self-pay

## 2013-11-03 DIAGNOSIS — Z01812 Encounter for preprocedural laboratory examination: Secondary | ICD-10-CM | POA: Insufficient documentation

## 2013-11-03 DIAGNOSIS — Z01818 Encounter for other preprocedural examination: Secondary | ICD-10-CM | POA: Insufficient documentation

## 2013-11-03 HISTORY — DX: Cardiac arrhythmia, unspecified: I49.9

## 2013-11-03 HISTORY — DX: Migraine, unspecified, not intractable, without status migrainosus: G43.909

## 2013-11-03 LAB — BASIC METABOLIC PANEL
BUN: 32 mg/dL — ABNORMAL HIGH (ref 6–23)
CO2: 28 mEq/L (ref 19–32)
Calcium: 9.9 mg/dL (ref 8.4–10.5)
Chloride: 101 mEq/L (ref 96–112)
Creatinine, Ser: 0.79 mg/dL (ref 0.50–1.10)
GFR calc Af Amer: 87 mL/min — ABNORMAL LOW (ref >90)
GFR calc non Af Amer: 75 mL/min — ABNORMAL LOW (ref >90)
Glucose, Bld: 105 mg/dL — ABNORMAL HIGH (ref 70–99)
Potassium: 3.9 mEq/L (ref 3.7–5.3)
Sodium: 141 mEq/L (ref 137–147)

## 2013-11-03 LAB — CBC
HCT: 39.7 % (ref 36.0–46.0)
Hemoglobin: 12.6 g/dL (ref 12.0–15.0)
MCH: 26.5 pg (ref 26.0–34.0)
MCHC: 31.7 g/dL (ref 30.0–36.0)
MCV: 83.4 fL (ref 78.0–100.0)
Platelets: 213 10*3/uL (ref 150–400)
RBC: 4.76 MIL/uL (ref 3.87–5.11)
RDW: 15.9 % — ABNORMAL HIGH (ref 11.5–15.5)
WBC: 6.7 10*3/uL (ref 4.0–10.5)

## 2013-11-03 LAB — SURGICAL PCR SCREEN
MRSA, PCR: NEGATIVE
Staphylococcus aureus: NEGATIVE

## 2013-11-03 NOTE — Progress Notes (Signed)
Primary physician - dr. Laurann Montana Cardiologist - dr. Percival Spanish ekg 09/2013 in epic; cardiac clearance in epic

## 2013-11-03 NOTE — Pre-Procedure Instructions (Signed)
Kerri [name]  11/03/2013   Your procedure is scheduled on:  Thursday, June 25th  Report to [facility] at 66 AM.  Call this number if you have problems the morning of surgery: [phone]   Remember:   Do not eat food or drink liquids after midnight.   Take these medicines the morning of surgery with A SIP OF WATER: nexium, synthroid, claritin, zoloft, eye drops if needed  Stop OTC vitamins, NSAIDS (ibuprofen, naproxen, advil) as of 6/18   Do not wear jewelry, make-up or nail polish.  Do not wear lotions, powders, or perfumes. You may wear deodorant.  Do not shave 48 hours prior to surgery. Men may shave face and neck.  Do not bring valuables to the hospital.  [facility] is not responsible  for any belongings or valuables.               Contacts, dentures or bridgework may not be worn into surgery.  Leave suitcase in the car. After surgery it may be brought to your room.  For patients admitted to the hospital, discharge time is determined by your treatment team.               Patients discharged the day of surgery will not be allowed to drive home.  Please read over the following fact sheets that you were given: Pain Booklet, Coughing and Deep Breathing, MRSA Information and Surgical Site Infection Prevention [place] - Preparing for Surgery  Before surgery, you can play an important role.  Because skin is not sterile, your skin needs to be as free of germs as possible.  You can reduce the number of germs on you skin by washing with CHG (chlorahexidine gluconate) soap before surgery.  CHG is an antiseptic cleaner which kills germs and bonds with the skin to continue killing germs even after washing.  Please DO NOT use if you have an allergy to CHG or antibacterial soaps.  If your skin becomes reddened/irritated stop using the CHG and inform your nurse when you arrive at Short Stay.  Do not shave (including legs and underarms) for at least 48 hours prior to the  first CHG shower.  You may shave your face.  Please follow these instructions carefully:   1.  Shower with CHG Soap the night before surgery and the morning of Surgery.  2.  If you choose to wash your hair, wash your hair first as usual with your normal shampoo.  3.  After you shampoo, rinse your hair and body thoroughly to remove the shampoo.  4.  Use CHG as you would any other liquid soap.  You can apply CHG directly to the skin and wash gently with scrungie or a clean washcloth.  5.  Apply the CHG Soap to your body ONLY FROM THE NECK DOWN.  Do not use on open wounds or open sores.  Avoid contact with your eyes, ears, mouth and genitals (private parts).  Wash genitals (private parts) with your normal soap.  6.  Wash thoroughly, paying special attention to the area where your surgery will be performed.  7.  Thoroughly rinse your body with warm water from the neck down.  8.  DO NOT shower/wash with your normal soap after using and rinsing off the CHG Soap.  9.  Pat yourself dry with a clean towel.            10.  Wear clean pajamas.  11.  Place clean sheets on your bed the night of your first shower and do not sleep with pets.  Day of Surgery  Do not apply any lotions/deoderants the morning of surgery.  Please wear clean clothes to the hospital/surgery center.

## 2013-11-12 MED ORDER — CEFAZOLIN SODIUM-DEXTROSE 2-3 GM-% IV SOLR
2.0000 g | INTRAVENOUS | Status: AC
  Administered 2013-11-13: 2 g via INTRAVENOUS
  Filled 2013-11-12: qty 50

## 2013-11-13 ENCOUNTER — Inpatient Hospital Stay (HOSPITAL_COMMUNITY): Payer: Medicare Other | Admitting: Certified Registered Nurse Anesthetist

## 2013-11-13 ENCOUNTER — Inpatient Hospital Stay (HOSPITAL_COMMUNITY): Payer: Medicare Other

## 2013-11-13 ENCOUNTER — Encounter (HOSPITAL_COMMUNITY): Payer: Self-pay | Admitting: Certified Registered Nurse Anesthetist

## 2013-11-13 ENCOUNTER — Encounter (HOSPITAL_COMMUNITY)
Admission: RE | Disposition: A | Discharge status: Home Health | Payer: Self-pay | Source: Ambulatory Visit | Attending: Neurosurgery

## 2013-11-13 ENCOUNTER — Encounter (HOSPITAL_COMMUNITY): Payer: Medicare Other | Admitting: Certified Registered Nurse Anesthetist

## 2013-11-13 ENCOUNTER — Inpatient Hospital Stay (HOSPITAL_COMMUNITY)
Admission: RE | Admit: 2013-11-13 | Discharge: 2013-11-15 | DRG: 520 | Disposition: A | Discharge status: Home Health | Payer: Medicare Other | Source: Ambulatory Visit | Attending: Neurosurgery | Admitting: Neurosurgery

## 2013-11-13 DIAGNOSIS — I10 Essential (primary) hypertension: Secondary | ICD-10-CM | POA: Diagnosis present

## 2013-11-13 DIAGNOSIS — I2510 Atherosclerotic heart disease of native coronary artery without angina pectoris: Secondary | ICD-10-CM | POA: Diagnosis present

## 2013-11-13 DIAGNOSIS — Z79899 Other long term (current) drug therapy: Secondary | ICD-10-CM

## 2013-11-13 DIAGNOSIS — F3289 Other specified depressive episodes: Secondary | ICD-10-CM | POA: Diagnosis present

## 2013-11-13 DIAGNOSIS — M48062 Spinal stenosis, lumbar region with neurogenic claudication: Principal | ICD-10-CM | POA: Diagnosis present

## 2013-11-13 DIAGNOSIS — Z87891 Personal history of nicotine dependence: Secondary | ICD-10-CM

## 2013-11-13 DIAGNOSIS — Z85820 Personal history of malignant melanoma of skin: Secondary | ICD-10-CM

## 2013-11-13 DIAGNOSIS — M810 Age-related osteoporosis without current pathological fracture: Secondary | ICD-10-CM | POA: Diagnosis present

## 2013-11-13 DIAGNOSIS — E785 Hyperlipidemia, unspecified: Secondary | ICD-10-CM | POA: Diagnosis present

## 2013-11-13 DIAGNOSIS — E039 Hypothyroidism, unspecified: Secondary | ICD-10-CM | POA: Diagnosis present

## 2013-11-13 DIAGNOSIS — F329 Major depressive disorder, single episode, unspecified: Secondary | ICD-10-CM | POA: Diagnosis present

## 2013-11-13 DIAGNOSIS — Z96649 Presence of unspecified artificial hip joint: Secondary | ICD-10-CM

## 2013-11-13 DIAGNOSIS — K219 Gastro-esophageal reflux disease without esophagitis: Secondary | ICD-10-CM | POA: Diagnosis present

## 2013-11-13 HISTORY — PX: LUMBAR LAMINECTOMY/DECOMPRESSION MICRODISCECTOMY: SHX5026

## 2013-11-13 SURGERY — LUMBAR LAMINECTOMY/DECOMPRESSION MICRODISCECTOMY 3 LEVELS
Anesthesia: General | Site: Spine Lumbar

## 2013-11-13 MED ORDER — NEOSTIGMINE METHYLSULFATE 10 MG/10ML IV SOLN
INTRAVENOUS | Status: DC | PRN
  Administered 2013-11-13: 3 mg via INTRAVENOUS

## 2013-11-13 MED ORDER — MENTHOL 3 MG MT LOZG: 1.0000 | LOZENGE | OROMUCOSAL | Status: DC | PRN

## 2013-11-13 MED ORDER — LIDOCAINE HCL (CARDIAC) 20 MG/ML IV SOLN
INTRAVENOUS | Status: AC
  Filled 2013-11-13: qty 5

## 2013-11-13 MED ORDER — ONDANSETRON HCL 4 MG/2ML IJ SOLN: 4.0000 mg | INTRAMUSCULAR | Status: DC | PRN

## 2013-11-13 MED ORDER — ONDANSETRON HCL 4 MG/2ML IJ SOLN
INTRAMUSCULAR | Status: AC
  Filled 2013-11-13: qty 2

## 2013-11-13 MED ORDER — EPHEDRINE SULFATE 50 MG/ML IJ SOLN
INTRAMUSCULAR | Status: DC | PRN
  Administered 2013-11-13: 5 mg via INTRAVENOUS

## 2013-11-13 MED ORDER — OXYCODONE HCL 5 MG/5ML PO SOLN: 5.0000 mg | Freq: Once | ORAL | Status: DC | PRN

## 2013-11-13 MED ORDER — MAGNESIUM 500 MG PO TABS: 500.0000 mg | ORAL_TABLET | Freq: Every day | ORAL | Status: DC

## 2013-11-13 MED ORDER — CEFAZOLIN SODIUM-DEXTROSE 2-3 GM-% IV SOLR
2.0000 g | Freq: Three times a day (TID) | INTRAVENOUS | Status: AC
  Administered 2013-11-13 – 2013-11-14 (×2): 2 g via INTRAVENOUS
  Filled 2013-11-13 (×2): qty 50

## 2013-11-13 MED ORDER — POLYVINYL ALCOHOL 1.4 % OP SOLN
1.0000 [drp] | OPHTHALMIC | Status: DC | PRN
  Filled 2013-11-13: qty 15

## 2013-11-13 MED ORDER — LIDOCAINE HCL (CARDIAC) 20 MG/ML IV SOLN
INTRAVENOUS | Status: DC | PRN
  Administered 2013-11-13: 60 mg via INTRAVENOUS

## 2013-11-13 MED ORDER — PANTOPRAZOLE SODIUM 40 MG PO TBEC
40.0000 mg | DELAYED_RELEASE_TABLET | Freq: Every day | ORAL | Status: DC
  Administered 2013-11-15: 40 mg via ORAL
  Filled 2013-11-13 (×2): qty 1

## 2013-11-13 MED ORDER — THROMBIN 5000 UNITS EX SOLR
CUTANEOUS | Status: DC | PRN
  Administered 2013-11-13 (×2): 5000 [IU] via TOPICAL

## 2013-11-13 MED ORDER — MIDAZOLAM HCL 2 MG/2ML IJ SOLN
INTRAMUSCULAR | Status: AC
  Filled 2013-11-13: qty 2

## 2013-11-13 MED ORDER — BUPIVACAINE LIPOSOME 1.3 % IJ SUSP
20.0000 mL | INTRAMUSCULAR | Status: AC
  Filled 2013-11-13: qty 20

## 2013-11-13 MED ORDER — FENTANYL CITRATE 0.05 MG/ML IJ SOLN
INTRAMUSCULAR | Status: DC | PRN
  Administered 2013-11-13 (×5): 50 ug via INTRAVENOUS

## 2013-11-13 MED ORDER — GLYCOPYRROLATE 0.2 MG/ML IJ SOLN
INTRAMUSCULAR | Status: DC | PRN
  Administered 2013-11-13: .5 mg via INTRAVENOUS
  Administered 2013-11-13: 0.1 mg via INTRAVENOUS

## 2013-11-13 MED ORDER — EPHEDRINE SULFATE 50 MG/ML IJ SOLN
INTRAMUSCULAR | Status: AC
  Filled 2013-11-13: qty 1

## 2013-11-13 MED ORDER — ARTIFICIAL TEARS OP OINT
TOPICAL_OINTMENT | Freq: Two times a day (BID) | OPHTHALMIC | Status: DC | PRN
  Filled 2013-11-13: qty 3.5

## 2013-11-13 MED ORDER — PROPOFOL 10 MG/ML IV BOLUS
INTRAVENOUS | Status: DC | PRN
  Administered 2013-11-13: 100 mg via INTRAVENOUS

## 2013-11-13 MED ORDER — SODIUM CHLORIDE 0.9 % IV SOLN
INTRAVENOUS | Status: DC | PRN
  Administered 2013-11-13: 14:00:00 via INTRAVENOUS

## 2013-11-13 MED ORDER — NAPROXEN 250 MG PO TABS
250.0000 mg | ORAL_TABLET | Freq: Two times a day (BID) | ORAL | Status: DC
  Filled 2013-11-13: qty 1

## 2013-11-13 MED ORDER — SCOPOLAMINE 1 MG/3DAYS TD PT72
MEDICATED_PATCH | TRANSDERMAL | Status: AC
  Filled 2013-11-13: qty 1

## 2013-11-13 MED ORDER — ROCURONIUM BROMIDE 50 MG/5ML IV SOLN
INTRAVENOUS | Status: AC
  Filled 2013-11-13: qty 1

## 2013-11-13 MED ORDER — BACITRACIN ZINC 500 UNIT/GM EX OINT
TOPICAL_OINTMENT | CUTANEOUS | Status: DC | PRN
  Administered 2013-11-13: 1 via TOPICAL

## 2013-11-13 MED ORDER — MIDAZOLAM HCL 2 MG/2ML IJ SOLN: 0.5000 mg | Freq: Once | INTRAMUSCULAR | Status: DC | PRN

## 2013-11-13 MED ORDER — OXYCODONE HCL 5 MG PO TABS: 5.0000 mg | ORAL_TABLET | Freq: Once | ORAL | Status: DC | PRN

## 2013-11-13 MED ORDER — BUPIVACAINE-EPINEPHRINE (PF) 0.5% -1:200000 IJ SOLN
INTRAMUSCULAR | Status: DC | PRN
  Administered 2013-11-13: 10 mL

## 2013-11-13 MED ORDER — SODIUM CHLORIDE 0.9 % IR SOLN
Status: DC | PRN
  Administered 2013-11-13: 14:00:00

## 2013-11-13 MED ORDER — 0.9 % SODIUM CHLORIDE (POUR BTL) OPTIME
TOPICAL | Status: DC | PRN
  Administered 2013-11-13: 1000 mL

## 2013-11-13 MED ORDER — LEVOTHYROXINE SODIUM 75 MCG PO TABS
75.0000 ug | ORAL_TABLET | Freq: Every day | ORAL | Status: DC
  Filled 2013-11-13: qty 1

## 2013-11-13 MED ORDER — HYDROCHLOROTHIAZIDE 12.5 MG PO CAPS
12.5000 mg | ORAL_CAPSULE | Freq: Every day | ORAL | Status: DC
  Filled 2013-11-13: qty 1

## 2013-11-13 MED ORDER — ALUM & MAG HYDROXIDE-SIMETH 200-200-20 MG/5ML PO SUSP: 30.0000 mL | Freq: Four times a day (QID) | ORAL | Status: DC | PRN

## 2013-11-13 MED ORDER — HEMOSTATIC AGENTS (NO CHARGE) OPTIME
TOPICAL | Status: DC | PRN
  Administered 2013-11-13: 1 via TOPICAL

## 2013-11-13 MED ORDER — ONDANSETRON HCL 4 MG/2ML IJ SOLN
INTRAMUSCULAR | Status: DC | PRN
  Administered 2013-11-13: 4 mg via INTRAVENOUS

## 2013-11-13 MED ORDER — NAPROXEN SODIUM 275 MG PO TABS
220.0000 mg | ORAL_TABLET | Freq: Two times a day (BID) | ORAL | Status: DC
  Filled 2013-11-13 (×2): qty 1

## 2013-11-13 MED ORDER — NEOSTIGMINE METHYLSULFATE 10 MG/10ML IV SOLN
INTRAVENOUS | Status: AC
  Filled 2013-11-13: qty 1

## 2013-11-13 MED ORDER — POLYETHYLENE GLYCOL 3350 17 G PO PACK
17.0000 g | PACK | Freq: Every day | ORAL | Status: DC
  Administered 2013-11-13: 17 g via ORAL
  Filled 2013-11-13: qty 1

## 2013-11-13 MED ORDER — PHENOL 1.4 % MT LIQD: 1.0000 | OROMUCOSAL | Status: DC | PRN

## 2013-11-13 MED ORDER — LACTATED RINGERS IV SOLN
INTRAVENOUS | Status: DC
  Administered 2013-11-13: 11:00:00 via INTRAVENOUS

## 2013-11-13 MED ORDER — HYDROMORPHONE HCL PF 1 MG/ML IJ SOLN
INTRAMUSCULAR | Status: AC
Start: 2013-11-13 — End: 2013-11-14
  Filled 2013-11-13: qty 1

## 2013-11-13 MED ORDER — GLYCOPYRROLATE 0.2 MG/ML IJ SOLN
INTRAMUSCULAR | Status: AC
  Filled 2013-11-13: qty 2

## 2013-11-13 MED ORDER — ROCURONIUM BROMIDE 100 MG/10ML IV SOLN
INTRAVENOUS | Status: DC | PRN
  Administered 2013-11-13: 40 mg via INTRAVENOUS

## 2013-11-13 MED ORDER — LACTATED RINGERS IV SOLN
INTRAVENOUS | Status: DC
  Administered 2013-11-13: 19:00:00 via INTRAVENOUS

## 2013-11-13 MED ORDER — DOCUSATE SODIUM 100 MG PO CAPS
100.0000 mg | ORAL_CAPSULE | Freq: Two times a day (BID) | ORAL | Status: DC
  Filled 2013-11-13 (×2): qty 1

## 2013-11-13 MED ORDER — PROPOFOL 10 MG/ML IV BOLUS
INTRAVENOUS | Status: AC
  Filled 2013-11-13: qty 20

## 2013-11-13 MED ORDER — BUPIVACAINE LIPOSOME 1.3 % IJ SUSP
INTRAMUSCULAR | Status: DC | PRN
  Administered 2013-11-13: 20 mL

## 2013-11-13 MED ORDER — LORATADINE 10 MG PO TABS
10.0000 mg | ORAL_TABLET | Freq: Every day | ORAL | Status: DC
  Filled 2013-11-13 (×2): qty 1

## 2013-11-13 MED ORDER — HYDROMORPHONE HCL PF 1 MG/ML IJ SOLN
0.2500 mg | INTRAMUSCULAR | Status: DC | PRN
  Administered 2013-11-13: 0.25 mg via INTRAVENOUS

## 2013-11-13 MED ORDER — STERILE WATER FOR INJECTION IJ SOLN
INTRAMUSCULAR | Status: AC
  Filled 2013-11-13: qty 10

## 2013-11-13 MED ORDER — FENTANYL CITRATE 0.05 MG/ML IJ SOLN
INTRAMUSCULAR | Status: AC
  Filled 2013-11-13: qty 5

## 2013-11-13 MED ORDER — HYPROMELLOSE (GONIOSCOPIC) 2.5 % OP SOLN
1.0000 [drp] | Freq: Two times a day (BID) | OPHTHALMIC | Status: DC | PRN
  Filled 2013-11-13: qty 15

## 2013-11-13 MED ORDER — DIAZEPAM 5 MG PO TABS
5.0000 mg | ORAL_TABLET | Freq: Four times a day (QID) | ORAL | Status: DC | PRN
  Administered 2013-11-13 – 2013-11-15 (×3): 5 mg via ORAL
  Filled 2013-11-13 (×3): qty 1

## 2013-11-13 MED ORDER — MAGNESIUM OXIDE 400 (241.3 MG) MG PO TABS
400.0000 mg | ORAL_TABLET | Freq: Every day | ORAL | Status: DC
  Filled 2013-11-13: qty 1

## 2013-11-13 MED ORDER — HYDROMORPHONE HCL 2 MG PO TABS
2.0000 mg | ORAL_TABLET | ORAL | Status: DC | PRN
  Administered 2013-11-13 – 2013-11-14 (×6): 2 mg via ORAL
  Filled 2013-11-13 (×6): qty 1

## 2013-11-13 MED ORDER — ATORVASTATIN CALCIUM 10 MG PO TABS
20.0000 mg | ORAL_TABLET | Freq: Every day | ORAL | Status: DC
  Administered 2013-11-13: 20 mg via ORAL
  Filled 2013-11-13: qty 2

## 2013-11-13 MED ORDER — POLYETHYLENE GLYCOL 3350 17 GM/SCOOP PO POWD
17.0000 g | Freq: Every day | ORAL | Status: DC
  Filled 2013-11-13: qty 255

## 2013-11-13 MED ORDER — MIDAZOLAM HCL 5 MG/5ML IJ SOLN
INTRAMUSCULAR | Status: DC | PRN
  Administered 2013-11-13: 2 mg via INTRAVENOUS

## 2013-11-13 MED ORDER — SERTRALINE HCL 50 MG PO TABS
50.0000 mg | ORAL_TABLET | Freq: Every day | ORAL | Status: DC
  Filled 2013-11-13: qty 1

## 2013-11-13 MED ORDER — HYDROMORPHONE HCL PF 1 MG/ML IJ SOLN: 0.5000 mg | INTRAMUSCULAR | Status: DC | PRN

## 2013-11-13 MED ORDER — MEPERIDINE HCL 25 MG/ML IJ SOLN: 6.2500 mg | INTRAMUSCULAR | Status: DC | PRN

## 2013-11-13 MED ORDER — LACTATED RINGERS IV SOLN
INTRAVENOUS | Status: DC | PRN
  Administered 2013-11-13: 13:00:00 via INTRAVENOUS

## 2013-11-13 MED ORDER — PROMETHAZINE HCL 25 MG/ML IJ SOLN: 6.2500 mg | INTRAMUSCULAR | Status: DC | PRN

## 2013-11-13 SURGICAL SUPPLY — 60 items
APL SKNCLS STERI-STRIP NONHPOA (GAUZE/BANDAGES/DRESSINGS) ×1
BAG DECANTER FOR FLEXI CONT (MISCELLANEOUS) ×2 IMPLANT
BENZOIN TINCTURE PRP APPL 2/3 (GAUZE/BANDAGES/DRESSINGS) ×2 IMPLANT
BLADE 10 SAFETY STRL DISP (BLADE) IMPLANT
BLADE SURG ROTATE 9660 (MISCELLANEOUS) IMPLANT
BRUSH SCRUB EZ PLAIN DRY (MISCELLANEOUS) ×2 IMPLANT
BUR ACORN 6.0 (BURR) ×2 IMPLANT
BUR MATCHSTICK NEURO 3.0 LAGG (BURR) ×2 IMPLANT
BURR 6.0MM ACORN ×2 IMPLANT
CANISTER SUCT 3000ML (MISCELLANEOUS) ×2 IMPLANT
CONT SPEC 4OZ CLIKSEAL STRL BL (MISCELLANEOUS) ×2 IMPLANT
DRAPE LAPAROTOMY 100X72X124 (DRAPES) ×2 IMPLANT
DRAPE MICROSCOPE LEICA (MISCELLANEOUS) ×2 IMPLANT
DRAPE POUCH INSTRU U-SHP 10X18 (DRAPES) ×2 IMPLANT
DRAPE SURG 17X23 STRL (DRAPES) ×8 IMPLANT
ELECT BLADE 4.0 EZ CLEAN MEGAD (MISCELLANEOUS) ×2
ELECT REM PT RETURN 9FT ADLT (ELECTROSURGICAL) ×2
ELECTRODE BLDE 4.0 EZ CLN MEGD (MISCELLANEOUS) ×1 IMPLANT
ELECTRODE REM PT RTRN 9FT ADLT (ELECTROSURGICAL) ×1 IMPLANT
GAUZE SPONGE 4X4 16PLY XRAY LF (GAUZE/BANDAGES/DRESSINGS) IMPLANT
GLOVE BIO SURGEON STRL SZ8.5 (GLOVE) ×2 IMPLANT
GLOVE BIOGEL PI IND STRL 7.0 (GLOVE) IMPLANT
GLOVE BIOGEL PI IND STRL 8 (GLOVE) ×1 IMPLANT
GLOVE BIOGEL PI INDICATOR 7.0 (GLOVE) ×3
GLOVE BIOGEL PI INDICATOR 8 (GLOVE) ×1
GLOVE ECLIPSE 7.5 STRL STRAW (GLOVE) ×1 IMPLANT
GLOVE EXAM NITRILE LRG STRL (GLOVE) IMPLANT
GLOVE EXAM NITRILE MD LF STRL (GLOVE) IMPLANT
GLOVE EXAM NITRILE XL STR (GLOVE) IMPLANT
GLOVE EXAM NITRILE XS STR PU (GLOVE) IMPLANT
GLOVE SS BIOGEL STRL SZ 6.5 (GLOVE) IMPLANT
GLOVE SS BIOGEL STRL SZ 8 (GLOVE) ×1 IMPLANT
GLOVE SUPERSENSE BIOGEL SZ 6.5 (GLOVE) ×2
GLOVE SUPERSENSE BIOGEL SZ 8 (GLOVE) ×1
GOWN STRL REUS W/ TWL LRG LVL3 (GOWN DISPOSABLE) ×1 IMPLANT
GOWN STRL REUS W/ TWL XL LVL3 (GOWN DISPOSABLE) ×2 IMPLANT
GOWN STRL REUS W/TWL 2XL LVL3 (GOWN DISPOSABLE) IMPLANT
GOWN STRL REUS W/TWL LRG LVL3 (GOWN DISPOSABLE) ×2
GOWN STRL REUS W/TWL XL LVL3 (GOWN DISPOSABLE) ×4
KIT BASIN OR (CUSTOM PROCEDURE TRAY) ×2 IMPLANT
KIT ROOM TURNOVER OR (KITS) ×2 IMPLANT
NEEDLE HYPO 21X1.5 SAFETY (NEEDLE) ×2 IMPLANT
NEEDLE HYPO 22GX1.5 SAFETY (NEEDLE) ×2 IMPLANT
NS IRRIG 1000ML POUR BTL (IV SOLUTION) ×2 IMPLANT
PACK LAMINECTOMY NEURO (CUSTOM PROCEDURE TRAY) ×2 IMPLANT
PAD ARMBOARD 7.5X6 YLW CONV (MISCELLANEOUS) ×10 IMPLANT
PATTIES SURGICAL .5 X1 (DISPOSABLE) IMPLANT
RUBBERBAND STERILE (MISCELLANEOUS) ×4 IMPLANT
SPONGE GAUZE 4X4 12PLY (GAUZE/BANDAGES/DRESSINGS) ×2 IMPLANT
SPONGE SURGIFOAM ABS GEL SZ50 (HEMOSTASIS) ×2 IMPLANT
STRIP CLOSURE SKIN 1/2X4 (GAUZE/BANDAGES/DRESSINGS) ×2 IMPLANT
SUT VIC AB 1 CT1 18XBRD ANBCTR (SUTURE) ×1 IMPLANT
SUT VIC AB 1 CT1 8-18 (SUTURE) ×2
SUT VIC AB 2-0 CP2 18 (SUTURE) ×3 IMPLANT
SYR 20CC LL (SYRINGE) ×2 IMPLANT
SYR 20ML ECCENTRIC (SYRINGE) ×2 IMPLANT
TAPE CLOTH SURG 4X10 WHT LF (GAUZE/BANDAGES/DRESSINGS) ×2 IMPLANT
TOWEL OR 17X24 6PK STRL BLUE (TOWEL DISPOSABLE) ×2 IMPLANT
TOWEL OR 17X26 10 PK STRL BLUE (TOWEL DISPOSABLE) ×2 IMPLANT
WATER STERILE IRR 1000ML POUR (IV SOLUTION) ×2 IMPLANT

## 2013-11-13 NOTE — Anesthesia Preprocedure Evaluation (Signed)
Anesthesia Evaluation  Patient identified by MRN, date of birth, ID band Patient awake    Reviewed: Allergy & Precautions, H&P , NPO status , Patient's Chart, lab work & pertinent test results  History of Anesthesia Complications (+) PONV and history of anesthetic complications  Airway Mallampati: I TM Distance: >3 FB Neck ROM: Full    Dental  (+) Edentulous Upper, Partial Lower, Missing, Dental Advisory Given   Pulmonary former smoker,  breath sounds clear to auscultation  Pulmonary exam normal       Cardiovascular hypertension, Pt. on medications + CAD (non-obstructive ASCADz '07) Rhythm:Regular Rate:Normal     Neuro/Psych Chronic back pain: NSAIDs    GI/Hepatic Neg liver ROS, GERD-  Medicated and Controlled,  Endo/Other  Hypothyroidism   Renal/GU negative Renal ROS     Musculoskeletal   Abdominal   Peds  Hematology  (+) anemia ,   Anesthesia Other Findings   Reproductive/Obstetrics                           Anesthesia Physical Anesthesia Plan  ASA: III  Anesthesia Plan: General   Post-op Pain Management:    Induction: Intravenous  Airway Management Planned: Oral ETT  Additional Equipment:   Intra-op Plan:   Post-operative Plan: Extubation in OR  Informed Consent: I have reviewed the patients History and Physical, chart, labs and discussed the procedure including the risks, benefits and alternatives for the proposed anesthesia with the patient or authorized representative who has indicated his/her understanding and acceptance.   Dental advisory given  Plan Discussed with: CRNA and Surgeon  Anesthesia Plan Comments: (Plan routine monitors, GETA)        Anesthesia Quick Evaluation

## 2013-11-13 NOTE — Op Note (Signed)
Brief history: The patient is an 80 year old white female who has complained of back and leg pain consistent with a lumbar radiculopathy/neurogenic claudication. She has failed medical management and was worked up with a lumbar MRI. This demonstrated lumbar spinal stenosis at L2-3, L3-4 and L4-5. I discussed the various treatment options with the patient including surgery. She has weighed the risks, benefits, and alternatives surgery and decided proceed with a decompressive laminectomy at L2-3, L3-4 and L4-5.  Preoperative diagnosis: L2-3, L3-4, L4-5 spinal stenosis, lumbago, lumbar radiculopathy, neurogenic claudication  Postoperative diagnosis: The same  Procedure: L3 and L4 laminectomy with bilateral L2 laminotomies to decompress the bilateral L3, L4 and L5 nerve roots  Surgeon: Dr. Earle Gell  Asst.: Dr. Jovita Gamma  Anesthesia: Gen. endotracheal  Estimated blood loss: 125 cc  Drains: One medium Hemovac drain in the epidural space  Complications: None  Description of procedure: The patient was brought to the operating room by the anesthesia team. General endotracheal anesthesia was induced. The patient was turned to the prone position on the Wilson frame. The patient's lumbosacral region was then prepared with Betadine scrub and Betadine solution. Sterile drapes were applied.  I then injected the area to be incised with Marcaine with epinephrine solution. I then used a scalpel to make a linear midline incision over the L2-3, L3-4 and L4-5 intervertebral disc space. I then used electrocautery to perform a lateral  subperiosteal dissection exposing the spinous process and lamina of L2, L3, L4 and L5. We obtained intraoperative radiograph to confirm our location. I then inserted the [facility] retractor for exposure. I used a scalpel to incise the interspinous ligament at L2-3, L3-4 and L4-5. I used a Leksell nodule were to remove the spinous process of L3 and L4 as well as a caudal  aspect of the L2 spinous process and the cephalad aspect of the L5 spinous process.  We then brought the operative microscope into the field. Under its magnification and illumination we completed the microdissection. I used a high-speed drill to perform bilateral laminotomies at L2, L3 and L4. I then used a Kerrison punches to complete the L3 and L4 laminectomy and to widen the laminotomies at L2 and removed the ligamentum flavum at L2-3, L3-4 and L4-5. We then used microdissection to free up the thecal sac and the bilateral L3, L4 and L5 nerve root from the epidural tissue. I then used a Kerrison punch to perform a foraminotomy at about the bilateral L3, L4 and L5 nerve root. We inspected the intervertebral disc at L2-3, L3-4 and L4-5 bilaterally. There was no significant herniations.  I then palpated along the ventral surface of the thecal sac and along exit route of the bilateral L3, L4 and L5 nerve root and noted that the neural structures were well decompressed. This completed the decompression.  We then obtained hemostasis using bipolar electrocautery. We irrigated the wound out with bacitracin solution. We then removed the retractor. I placed a medium Hemovac drain in the epidural space and tunneled it out through separate stab wound. We then reapproximated the patient's thoracolumbar fascia with interrupted #1 Vicryl suture. We then reapproximated the patient's subcutaneous tissue with interrupted 2-0 Vicryl suture. We then reapproximated patient's skin with Steri-Strips and benzoin. The was then coated with bacitracin ointment. The drapes were removed. The patient was subsequently returned to the supine position where they were extubated by the anesthesia team. The patient was then transported to the postanesthesia care unit in stable condition. All sponge instrument and needle  counts were reportedly correct at the end of this case.

## 2013-11-13 NOTE — Progress Notes (Signed)
After given pain medication, patient sat up in bed for the first time since surgery. She stated that she felt nauseous but wanted to get to the [facility] to urinate. Once patient was on [facility] she urinated and then felt faint, weak, and was pale. Patient stopped answering me, with assistance we were able to get her back in bed. She stated that she started to feel better. Vital signs were taken and oxygen was administered. Will continue to monitor. [name], Rande Brunt

## 2013-11-13 NOTE — Anesthesia Postprocedure Evaluation (Signed)
  Anesthesia Post-op Note  Patient: Kerri [name]  Procedure(s) Performed: Procedure(s): LUMBAR TWO-THREE,LUMBAR THREE-FOUR,LUMBAR FOUR-FIVE LAMINECTOMY AND FORAMINOTOMY (N/A)  Patient Location: PACU  Anesthesia Type:General  Level of Consciousness: awake and alert   Airway and Oxygen Therapy: Patient Spontanous Breathing  Post-op Pain: mild  Post-op Assessment: Post-op Vital signs reviewed  Post-op Vital Signs: stable  Last Vitals:  Filed Vitals:   11/13/13 1645  BP: 149/77  Pulse: 67  Temp:   Resp: 13    Complications: No apparent anesthesia complications

## 2013-11-13 NOTE — H&P (Signed)
Subjective: Patient is an 80 year old white female who complains of buttock and leg pain consistent with neurogenic claudication. She has failed medical management and was worked up with a lumbar MRI. This demonstrated multilevel degenerative changes with spinal stenosis at L2-3, L3-4 and L4-5. I discussed the various treatment options with the patient including surgery. She has weighed the risks, benefits, and alternative surgery and decided proceed with a decompressive lumbar laminectomy.   Past Medical History  Diagnosis Date  . CAD (coronary artery disease)     Chest CTA 10/07: 40% or less pLAD;  ETT 8/09: negative  . Dyslipidemia   . HTN (hypertension)   . DJD (degenerative joint disease)   . Hypothyroidism   . Melanoma     metastatic; left leg; s/p multiple excisions  . Carotid bruit   . Spinal stenosis   . Chronic back pain   . Osteoporosis   . GERD (gastroesophageal reflux disease)   . IBS (irritable bowel syndrome)   . Depression   . Dysrhythmia     pvcs  . Migraine     Past Surgical History  Procedure Laterality Date  . Melanoma excision    . Knee arthroscopy    . Shoulder surgery    . Carpal tunnel release    . Toe surgery    . Back surgery    . Total hip arthroplasty  09/04/2011    Procedure: TOTAL HIP ARTHROPLASTY;  Surgeon: Gearlean Alf, MD;  Location: [facility];  Service: Orthopedics;  Laterality: Left;  Marland Kitchen Melanoma excision Left 09/19/2012    Procedure: WIDE EXCISION METASTATIC MELANOMA LEFT MEDIAL THIGH;  Surgeon: Zenovia Jarred, MD;  Location: [facility];  Service: General;  Laterality: Left;  . Bunionectomy with hammertoe reconstruction Right 01/16/2013    Procedure: RIGHT Barbie Banner OSTEOTOMY WITH SIMPLE BUNIONECTOMY AND SECOND HAMMER TOE CORRECTION;  Surgeon: Wylene Simmer, MD;  Location: [facility];  Service: Orthopedics;  Laterality: Right;    Allergies  Allergen Reactions  . Codeine Nausea And Vomiting    Patient gets sick on  stomach  . Morphine And Related Itching    Happened a while back. Patient noted that any narcotic based medication makes her have a bad reaction.  . Tramadol Nausea And Vomiting and Other (See Comments)    confusion  . Adhesive [Tape] Other (See Comments)    Tears skin   . Hydrocodone-Acetaminophen Nausea And Vomiting  . Lactose Intolerance (Gi) Other (See Comments)    Gi upset   . Oxycodone Nausea And Vomiting    History  Substance Use Topics  . Smoking status: Former Smoker    Quit date: 08/31/1948  . Smokeless tobacco: Never Used  . Alcohol Use: Yes     Comment: occasional drinker    No family history on file. Prior to Admission medications   Medication Sig Start Date End Date Taking? Authorizing Provider  esomeprazole (NEXIUM) 20 MG capsule Take 20 mg by mouth daily at 12 noon.   Yes Historical Provider, MD  hydrochlorothiazide (MICROZIDE) 12.5 MG capsule Take 12.5 mg by mouth daily after breakfast.  04/11/11  Yes Historical Provider, MD  hydroxypropyl methylcellulose (ISOPTO TEARS) 2.5 % ophthalmic solution Place 1 drop into both eyes 2 (two) times daily as needed for dry eyes. Dry eyes   Yes Historical Provider, MD  levothyroxine (SYNTHROID, LEVOTHROID) 75 MCG tablet Take 75 mcg by mouth daily with breakfast.   Yes Historical Provider, MD  LIDODERM 5 % Place 1 patch  onto the skin daily as needed (pain).  04/01/11  Yes Historical Provider, MD  loratadine (CLARITIN) 10 MG tablet Take 10 mg by mouth daily.    Yes Historical Provider, MD  Magnesium 500 MG TABS Take 500 mg by mouth daily.   Yes Historical Provider, MD  naproxen sodium (ANAPROX) 220 MG tablet Take 220 mg by mouth 2 (two) times daily with a meal.   Yes Historical Provider, MD  OLUX-E 0.05 % topical foam Apply 1 application topically 2 (two) times daily as needed. To scalp for Itching 05/02/11  Yes Historical Provider, MD  polyethylene glycol powder (GLYCOLAX/MIRALAX) powder Take 17 g by mouth daily.   Yes Historical  Provider, MD  RETIN-A 0.1 % cream Apply 1 application topically 2 (two) times a week.  03/26/12  Yes Historical Provider, MD  rosuvastatin (CRESTOR) 10 MG tablet Take 10 mg by mouth at bedtime.    Yes Historical Provider, MD  sertraline (ZOLOFT) 50 MG tablet Take 50 mg by mouth Daily.  05/04/12  Yes Historical Provider, MD  VITAMIN D, CHOLECALCIFEROL, PO Take 1 tablet by mouth daily.   Yes Historical Provider, MD     Review of Systems  Positive ROS: As above  All other systems have been reviewed and were otherwise negative with the exception of those mentioned in the HPI and as above.  Objective: Vital signs in last 24 hours: Temp:  [97.3 F (36.3 C)] 97.3 F (36.3 C) (06/25 1043) Pulse Rate:  [67] 67 (06/25 1043) Resp:  [18] 18 (06/25 1043) BP: (159)/(79) 159/79 mmHg (06/25 1043) SpO2:  [95 %] 95 % (06/25 1043) Weight:  [61.236 kg (135 lb)] 61.236 kg (135 lb) (06/25 1043)  General Appearance: Alert, cooperative, no distress, Head: Normocephalic, without obvious abnormality, atraumatic Eyes: PERRL, conjunctiva/corneas clear, EOM's intact,    Ears: Normal  Throat: Normal  Neck: Supple, symmetrical, trachea midline, no adenopathy; thyroid: No enlargement/tenderness/nodules; no carotid bruit or JVD Back: Symmetric, no curvature, ROM normal, no CVA tenderness Lungs: Clear to auscultation bilaterally, respirations unlabored Heart: Regular rate and rhythm, no murmur, rub or gallop Abdomen: Soft, non-tender,, no masses, no organomegaly Extremities: Extremities normal, atraumatic, no cyanosis or edema Pulses: 2+ and symmetric all extremities Skin: Skin color, texture, turgor normal, no rashes or lesions  NEUROLOGIC:   Mental status: alert and oriented, no aphasia, good attention span, Fund of knowledge/ memory ok Motor Exam - grossly normal Sensory Exam - grossly normal Reflexes:  Coordination - grossly normal Gait - grossly normal Balance - grossly normal Cranial Nerves: I:  smell Not tested  II: visual acuity  OS: Normal  OD: Normal   II: visual fields Full to confrontation  II: pupils Equal, round, reactive to light  III,VII: ptosis None  III,IV,VI: extraocular muscles  Full ROM  V: mastication Normal  V: facial light touch sensation  Normal  V,VII: corneal reflex  Present  VII: facial muscle function - upper  Normal  VII: facial muscle function - lower Normal  VIII: hearing Not tested  IX: soft palate elevation  Normal  IX,X: gag reflex Present  XI: trapezius strength  5/5  XI: sternocleidomastoid strength 5/5  XI: neck flexion strength  5/5  XII: tongue strength  Normal    Data Review Lab Results  Component Value Date   WBC 6.7 11/03/2013   HGB 12.6 11/03/2013   HCT 39.7 11/03/2013   MCV 83.4 11/03/2013   PLT 213 11/03/2013   Lab Results  Component Value Date  NA 141 11/03/2013   K 3.9 11/03/2013   CL 101 11/03/2013   CO2 28 11/03/2013   BUN 32* 11/03/2013   CREATININE 0.79 11/03/2013   GLUCOSE 105* 11/03/2013   Lab Results  Component Value Date   INR 0.97 05/16/2012    Assessment/Plan: L2-3, L3-4 and L4-5 spinal stenosis, lumbago, lumbar radiculopathy: I discussed situation with the patient. I reviewed her imaging studies with her and pointed out the abnormalities. We have discussed the various treatment options including surgery. I described the surgical treatment option of a L2-3, L3-4 and a L4-5 laminectomy, laminotomies. I've shown her surgical models. We have discussed the risks, benefits, alternatives, and likelihood of achieving our goals with surgery. I have answered all the patient's questions. She has decided proceed with surgery.   Ophelia Charter 11/13/2013 12:23 PM

## 2013-11-13 NOTE — Transfer of Care (Signed)
Immediate Anesthesia Transfer of Care Note  Patient: Kerri [name]  Procedure(s) Performed: Procedure(s): LUMBAR TWO-THREE,LUMBAR THREE-FOUR,LUMBAR FOUR-FIVE LAMINECTOMY AND FORAMINOTOMY (N/A)  Patient Location: PACU  Anesthesia Type:General  Level of Consciousness: sedated, patient cooperative and responds to stimulation  Airway & Oxygen Therapy: Patient Spontanous Breathing and Patient connected to nasal cannula oxygen  Post-op Assessment: Report given to PACU RN, Post -op Vital signs reviewed and stable, Patient moving all extremities and Patient moving all extremities X 4  Post vital signs: Reviewed and stable  Complications: No apparent anesthesia complications

## 2013-11-13 NOTE — Progress Notes (Signed)
Patient ID: Kerri [name], female   DOB: 31-Dec-1929, 80 y.o.   MRN: [id number] Social visit [name], MD, MPH, [place] Trauma: [phone] General Surgery: [phone]

## 2013-11-13 NOTE — Progress Notes (Signed)
Subjective:  The patient is somnolent but easily arousable. She is in no apparent distress.  Objective: Vital signs in last 24 hours: Temp:  [97.3 F (36.3 C)-98.1 F (36.7 C)] 98.1 F (36.7 C) (06/25 1542) Pulse Rate:  [67-87] 87 (06/25 1542) Resp:  [18-19] 19 (06/25 1542) BP: (153-159)/(79-84) 153/84 mmHg (06/25 1542) SpO2:  [95 %-99 %] 99 % (06/25 1542) Weight:  [61.236 kg (135 lb)] 61.236 kg (135 lb) (06/25 1043)  Intake/Output from previous day:   Intake/Output this shift: Total I/O In: 1300 [I.V.:1300] Out: 100 [Blood:100]  Physical exam the patient is somnolent but arousable. She is moving her lower extremities well.  Lab Results: No results found for this basename: WBC, HGB, HCT, PLT,  in the last 72 hours BMET No results found for this basename: NA, K, CL, CO2, GLUCOSE, BUN, CREATININE, CALCIUM,  in the last 72 hours  Studies/Results: Dg Lumbar Spine 1 View  11/13/2013   CLINICAL DATA:  80 year old female undergoing lumbar surgery. Initial encounter.  EXAM: LUMBAR SPINE - 1 VIEW  COMPARISON:  [facility] lumbar MRI 08/28/2013.  FINDINGS: Intraoperative portable cross-table lateral view of the lumbar spine labeled #1 at 1350 hrs.  Same numbering system used as on the comparison. A Surgical probe is directed toward the L3-L4 disc space on this image. Sequelae of sacral plasty re- identified.  IMPRESSION: Intraoperative localization at L3-L4.   Electronically Signed   By: Lars Pinks M.D.   On: 11/13/2013 14:26    Assessment/Plan: The patient is doing well. I spoke with her daughter.  LOS: 0 days     Jasper Ruminski D 11/13/2013, 3:49 PM

## 2013-11-14 MED ORDER — DIPHENHYDRAMINE HCL 25 MG PO CAPS
25.0000 mg | ORAL_CAPSULE | Freq: Four times a day (QID) | ORAL | Status: DC | PRN
  Administered 2013-11-14: 25 mg via ORAL
  Filled 2013-11-14: qty 1

## 2013-11-14 MED ORDER — KETOROLAC TROMETHAMINE 30 MG/ML IJ SOLN
30.0000 mg | Freq: Four times a day (QID) | INTRAMUSCULAR | Status: DC
  Administered 2013-11-14 – 2013-11-15 (×4): 30 mg via INTRAMUSCULAR
  Filled 2013-11-14 (×4): qty 1

## 2013-11-14 NOTE — Progress Notes (Signed)
Orthopedic Tech Progress Note Patient Details:  Kerri [name] August 02, 1929 [id number] Spoke with nurse to clarify order placed for "abdominal wrap"; nurse stated MD said he did not want patient to have an expensive brace because he does not think she needs one, brace is more for patient's comfort and peace of mind. Abdominal binder delivered to patient. Ortho Devices Type of Ortho Device: Abdominal binder Ortho Device/Splint Interventions: Ordered   [name] 11/14/2013, 2:14 PM

## 2013-11-14 NOTE — Evaluation (Signed)
Physical Therapy Evaluation Patient Details Name: Islay Polanco MRN: [id number] DOB: 1929/11/30 Today's Date: 11/14/2013   History of Present Illness  s/p L2-L5 laminectomy/FORAMINOTOMY  Clinical Impression  Patient is s/p surgery listed above resulting in the deficits listed below (see PT Problem List). Patient will benefit from skilled PT to increase their independence and safety with mobility (while adhering to their precautions) to allow discharge to next appropriate venue. Difficult to determine appropriate venue for post acute at this time. Pt very lethargic and had decr safety awareness and cognition deficits during session. Will cont to follow and assess need for post acute rehab placement. Pt is a high fall risk at this time.     Follow Up Recommendations SNF;Home health PT;Supervision/Assistance - 24 hour (limited due to lethargy; may require SNF )    Equipment Recommendations  Rolling walker with 5" wheels;3in1 (PT)    Recommendations for Other Services OT consult     Precautions / Restrictions Precautions Precautions: Fall;Back Precaution Booklet Issued: Yes (comment) Precaution Comments: pt given handout; demo poor recall precautions; required max cues throughout sesion to adhere  Required Braces or Orthoses: Other Brace/Splint Other Brace/Splint: pt has brace that she prefers to wear; no MD orders for brace at this time  Restrictions Weight Bearing Restrictions: No      Mobility  Bed Mobility Overal bed mobility: Needs Assistance Bed Mobility: Rolling;Sidelying to Sit Rolling: Min assist Sidelying to sit: Min assist       General bed mobility comments: (A) to perform log rolling technique and max cues for technique   Transfers Overall transfer level: Needs assistance Equipment used: Rolling walker (2 wheeled) Transfers: Sit to/from [facility] Sit to Stand: Mod assist Stand pivot transfers: Mod assist       General transfer comment:  performed SPT bed  <> BSC <> recliner; pt with eyes closed throughout and demo decr safety awareness; max cues for safety and sequencing and mod (A) to maintain balance and perform safely  Ambulation/Gait             General Gait Details: not safe to ambulate at this time due to lethargy and decr safety awareness   Stairs            Wheelchair Mobility    Modified Rankin (Stroke Patients Only)       Balance Overall balance assessment: Needs assistance Sitting-balance support: Feet supported;Bilateral upper extremity supported Sitting balance-Leahy Scale: Poor Sitting balance - Comments: pt relying on UE support; eyes closed; denies dizziness Postural control: Posterior lean Standing balance support: During functional activity;Bilateral upper extremity supported Standing balance-Leahy Scale: Poor Standing balance comment: pt unsteady; requires bil UE support and (A) to maintain balance; able to use Rt UE to perform pericare but requires (A) to maintain balance                               Pertinent Vitals/Pain 10/10 per pt; however pt falling asleep frequently during session    Home Living Family/patient expects to be discharged to:: Private residence Living Arrangements: Spouse/significant other;Children Available Help at Discharge: Family;Available 24 hours/day Type of Home: House Home Access: Stairs to enter   [employer] of Steps: 1 Home Layout: One level Home Equipment: Walker - 4 wheels;Cane - single point      Prior Function Level of Independence: Independent with assistive device(s)         Comments: ambulates with SPC; pt drives  and is independent      Hand Dominance   Dominant Hand: Right    Extremity/Trunk Assessment   Upper Extremity Assessment: Defer to OT evaluation           Lower Extremity Assessment: Generalized weakness      Cervical / Trunk Assessment: Normal  Communication   Communication: No  difficulties  Cognition Arousal/Alertness: Lethargic;Suspect due to medications Behavior During Therapy: [facility] for tasks assessed/performed Overall Cognitive Status: No family/caregiver present to determine baseline cognitive functioning Area of Impairment: Problem solving;Safety/judgement;Following commands;Attention   Current Attention Level: Sustained Memory: Decreased recall of precautions Following Commands: Follows one step commands with increased time Safety/Judgement: Decreased awareness of safety;Decreased awareness of deficits   Problem Solving: Slow processing;Difficulty sequencing;Requires verbal cues;Requires tactile cues General Comments: could be due to lethargy; max cues for sequencing and motor planning; pt repeating herself on multiple occassions     General Comments General comments (skin integrity, edema, etc.): no family present at this time to determine (A) at home and cognition baseline     Exercises        Assessment/Plan    PT Assessment Patient needs continued PT services  PT Diagnosis Difficulty walking;Generalized weakness;Acute pain   PT Problem List Decreased strength;Decreased activity tolerance;Decreased balance;Decreased mobility;Decreased cognition;Decreased safety awareness;Decreased knowledge of use of DME;Decreased knowledge of precautions;Pain  PT Treatment Interventions DME instruction;Gait training;Functional mobility training;Therapeutic activities;Stair training;Therapeutic exercise;Balance training;Neuromuscular re-education;Patient/family education   PT Goals (Current goals can be found in the Care Plan section) Acute Rehab PT Goals Patient Stated Goal: none stated  PT Goal Formulation: With patient Time For Goal Achievement: 11/18/13 Potential to Achieve Goals: Good    Frequency Min 5X/week   Barriers to discharge        Co-evaluation               End of Session Equipment Utilized During Treatment: Gait belt Activity  Tolerance: Patient limited by lethargy;Patient limited by pain Patient left: in chair;with call bell/phone within reach;with chair alarm set Nurse Communication: Mobility status;Precautions         Time: 0932-6712 PT Time Calculation (min): 28 min   Charges:   PT Evaluation $Initial PT Evaluation Tier I: 1 Procedure PT Treatments $Therapeutic Activity: 23-37 mins   PT G CodesGustavus Bryant, [place]  [phone] 11/14/2013, 11:55 AM

## 2013-11-14 NOTE — Progress Notes (Signed)
Patient ID: Kerri [name], female   DOB: 1929-12-15, 80 y.o.   MRN: [id number] Subjective:  The patient is alert and pleasant. Her back is appropriately sore. The patient requests a back brace.  Objective: Vital signs in last 24 hours: Temp:  [97.3 F (36.3 C)-98.3 F (36.8 C)] 98.3 F (36.8 C) (06/26 0934) Pulse Rate:  [66-87] 70 (06/26 0934) Resp:  [8-19] 16 (06/26 0934) BP: (97-161)/(51-85) 125/66 mmHg (06/26 0934) SpO2:  [91 %-100 %] 91 % (06/26 0934) Weight:  [61.236 kg (135 lb)] 61.236 kg (135 lb) (06/25 1043)  Intake/Output from previous day: 06/25 0701 - 06/26 0700 In: 1300 [I.V.:1300] Out: 260 [Drains:160; Blood:100] Intake/Output this shift: Total I/O In: 360 [P.O.:360] Out: -   Physical exam the patient is alert and pleasant. She is moving her lower extremities well.  Lab Results: No results found for this basename: WBC, HGB, HCT, PLT,  in the last 72 hours BMET No results found for this basename: NA, K, CL, CO2, GLUCOSE, BUN, CREATININE, CALCIUM,  in the last 72 hours  Studies/Results: Dg Lumbar Spine 1 View  11/13/2013   CLINICAL DATA:  80 year old female undergoing lumbar surgery. Initial encounter.  EXAM: LUMBAR SPINE - 1 VIEW  COMPARISON:  [place] [facility] lumbar MRI 08/28/2013.  FINDINGS: Intraoperative portable cross-table lateral view of the lumbar spine labeled #1 at 1350 hrs.  Same numbering system used as on the comparison. A Surgical probe is directed toward the L3-L4 disc space on this image. Sequelae of sacral plasty re- identified.  IMPRESSION: Intraoperative localization at L3-L4.   Electronically Signed   By: Lars Pinks M.D.   On: 11/13/2013 14:26    Assessment/Plan: Postop day 1: The patient is doing well. We will mobilize her with PT. I will order a lumbar wrap as requested. She will likely go home tomorrow.  LOS: 1 day     Gearold Wainer D 11/14/2013, 10:22 AM

## 2013-11-14 NOTE — Progress Notes (Signed)
UR complete.  Sahily Biddle RN, MSN 

## 2013-11-15 MED ORDER — DSS 100 MG PO CAPS: 100.0000 mg | ORAL_CAPSULE | Freq: Two times a day (BID) | ORAL | Status: DC

## 2013-11-15 MED ORDER — DIAZEPAM 2 MG PO TABS: 2.0000 mg | ORAL_TABLET | Freq: Four times a day (QID) | ORAL | Status: DC | PRN

## 2013-11-15 MED ORDER — TAPENTADOL HCL 50 MG PO TABS
50.0000 mg | ORAL_TABLET | ORAL | Status: DC | PRN
Start: 2013-11-15 — End: 2015-06-21

## 2013-11-15 MED ORDER — TAPENTADOL HCL 50 MG PO TABS: 50.0000 mg | ORAL_TABLET | ORAL | Status: DC | PRN

## 2013-11-15 NOTE — Progress Notes (Signed)
Physical Therapy Treatment Patient Details Name: Kerri [name] MRN: [id number] DOB: [birth year] Today's Date: 11/15/2013    History of Present Illness s/p L2-L5 laminectomy/FORAMINOTOMY    PT Comments    Pt with improved activity tolerance today. Now at functional level that is safe to go home with 24 hour family assistance.  Follow Up Recommendations  Home health PT;Supervision/Assistance - 24 hour     Equipment Recommendations  Other (comment) (none- has all needed DME at this time per daughter.)       Precautions / Restrictions Precautions Precaution Comments: Pt did not recall working with rehab yesterday, re-educated on precautions. Needed cues with mobilty throught out session. Other Brace/Splint: pt has corsett brace that MD got her for comfort only, did not wear with session today. Restrictions Weight Bearing Restrictions: No    Mobility  Bed Mobility Overal bed mobility: Needs Assistance Bed Mobility: Sidelying to Sit;Sit to Sidelying Rolling: Min assist Sidelying to sit: Min assist     Sit to sidelying: Min assist General bed mobility comments: needed cues and assistance to roll onto right side and then come up into sitting position at edge of bed. Pt and daughter educated on logroll technique for back precautions and to decr risk of further/future injury after daughter reported that they get her up differently at home.                            Transfers Overall transfer level: Needs assistance Equipment used: Rolling walker (2 wheeled) Transfers: Sit to/from Stand Sit to Stand: Min guard         General transfer comment: cues for hand placment with transfers for safety.  Ambulation/Gait Ambulation/Gait assistance: Min guard Ambulation Distance (Feet): 130 Feet Assistive device: Rolling walker (2 wheeled) Gait Pattern/deviations: Step-through pattern;Decreased stride length;Shuffle;Narrow base of support Gait velocity: very decreased Gait velocity  interpretation: Below normal speed for age/gender General Gait Details: steady, no loss of balance. needed cues to not twist to look behind her at enviromental distraction's x2.   Stairs            Wheelchair Mobility    Modified Rankin (Stroke Patients Only)          Cognition   Behavior During Therapy: WFL for tasks assessed/performed Overall Cognitive Status: Within Functional Limits for tasks assessed Area of Impairment: Memory;Safety/judgement;Awareness   Current Attention Level: Selective Memory: Decreased recall of precautions Following Commands: Follows one step commands consistently;Follows multi-step commands inconsistently Safety/Judgement: Decreased awareness of safety   Problem Solving: Slow processing;Requires verbal cues General Comments: Pt still sleepy, however was able to perform increased activity with less assistance. Pt's daughter in room and reports pt will have 24 hours assist at home.           PT Goals (current goals can now be found in the care plan section) Acute Rehab PT Goals Patient Stated Goal: none stated  PT Goal Formulation: With patient Time For Goal Achievement: 11/18/13 Potential to Achieve Goals: Good Progress towards PT goals: Progressing toward goals    Frequency  Min 5X/week    PT Plan Discharge plan needs to be updated       End of Session Equipment Utilized During Treatment: Gait belt Activity Tolerance: Patient tolerated treatment well Patient left: in bed;with family/visitor present;with call bell/phone within reach     Time: 1115-1138 PT Time Calculation (min): 23 min  Charges:  $Gait Training: 8-22 mins $Therapeutic Activity: 8-22 mins  G CodesWillow Ora 11/15/2013, 1:23 PM  Willow Ora, PTA Office- [phone]

## 2013-11-15 NOTE — Progress Notes (Signed)
Patient's daughter is an Therapist, sports and refuses bed alarm even after explanation.  She insists on being able to ambulate her mother and get her up and down to the bathroom.  Kerri Perches

## 2013-11-15 NOTE — Progress Notes (Signed)
Patient ID: Kerri [name], female   DOB: 1929/07/10, 80 y.o.   MRN: [id number] Subjective:  The patient is alert and pleasant. She complains of back soreness. She is not mobilize well get. She wants to stay until tomorrow.  Objective: Vital signs in last 24 hours: Temp:  [97.5 F (36.4 C)-98.6 F (37 C)] 98.2 F (36.8 C) (06/27 0530) Pulse Rate:  [68-87] 76 (06/27 0530) Resp:  [16] 16 (06/27 0530) BP: (98-125)/(44-66) 98/55 mmHg (06/27 0530) SpO2:  [90 %-97 %] 93 % (06/27 0530)  Intake/Output from previous day: 06/26 0701 - 06/27 0700 In: 360 [P.O.:360] Out: 2 [Urine:2] Intake/Output this shift:    Physical exam patient is alert and oriented. Her strength is grossly normal in her lower extremities.  Lab Results: No results found for this basename: WBC, HGB, HCT, PLT,  in the last 72 hours BMET No results found for this basename: NA, K, CL, CO2, GLUCOSE, BUN, CREATININE, CALCIUM,  in the last 72 hours  Studies/Results: Dg Lumbar Spine 1 View  11/13/2013   CLINICAL DATA:  80 year old female undergoing lumbar surgery. Initial encounter.  EXAM: LUMBAR SPINE - 1 VIEW  COMPARISON:  [place] [facility] lumbar MRI 08/28/2013.  FINDINGS: Intraoperative portable cross-table lateral view of the lumbar spine labeled #1 at 1350 hrs.  Same numbering system used as on the comparison. A Surgical probe is directed toward the L3-L4 disc space on this image. Sequelae of sacral plasty re- identified.  IMPRESSION: Intraoperative localization at L3-L4.   Electronically Signed   By: Lars Pinks M.D.   On: 11/13/2013 14:26    Assessment/Plan: Postop day #2: We will mobilize the patient with PT. She will likely go home tomorrow.  LOS: 2 days     Iyona Pehrson D 11/15/2013, 8:24 AM

## 2013-11-15 NOTE — Discharge Summary (Signed)
Physician Discharge Summary  Patient ID: Kerri [name] MRN: [id number] DOB/AGE: 78-May-1931 78 y.o.  Admit date: 11/13/2013 Discharge date: 11/15/2013  Admission Diagnoses: Lumbar spinal stenosis, lumbago, lumbar radiculopathy, neurogenic claudication  Discharge Diagnoses: The same Active Problems:   Lumbar stenosis with neurogenic claudication   Discharged Condition: good  Hospital Course: I performed an L2-3, L3-4 and L4-5 laminectomy on the patient on 11/13/2013. The surgery went well.  The patient's postoperative course was unremarkable. On postop day #2 the patient requested discharge to home. The patient, and her daughter, were given oral and written discharge instructions. All their questions were answered.  Consults: PT, OT Significant Diagnostic Studies: None Treatments: L2-3, L3-4 and L4-5 laminectomy Discharge Exam: Blood pressure 107/59, pulse 73, temperature 99 F (37.2 C), temperature source Axillary, resp. rate 18, height 5\' 5"  (1.651 m), weight 61.236 kg (135 lb), SpO2 94.00%. The patient is alert and pleasant. Her strength is normal in her lower extremities.  Disposition: Home  Discharge Instructions   Call MD for:  difficulty breathing, headache or visual disturbances    Complete by:  As directed      Call MD for:  extreme fatigue    Complete by:  As directed      Call MD for:  hives    Complete by:  As directed      Call MD for:  persistant dizziness or light-headedness    Complete by:  As directed      Call MD for:  persistant nausea and vomiting    Complete by:  As directed      Call MD for:  redness, tenderness, or signs of infection (pain, swelling, redness, odor or green/yellow discharge around incision site)    Complete by:  As directed      Call MD for:  severe uncontrolled pain    Complete by:  As directed      Call MD for:  temperature >100.4    Complete by:  As directed      Diet - low sodium heart healthy    Complete by:  As directed      Discharge instructions    Complete by:  As directed   Call [phone] for a followup appointment. Take a stool softener while you are using pain medications.     Driving Restrictions    Complete by:  As directed   Do not drive for 2 weeks.     Increase activity slowly    Complete by:  As directed      Lifting restrictions    Complete by:  As directed   Do not lift more than 5 pounds. No excessive bending or twisting.     May shower / Bathe    Complete by:  As directed   He may shower after the pain she is removed 3 days after surgery. Leave the incision alone.     Remove dressing in 24 hours    Complete by:  As directed             Medication List    STOP taking these medications       LIDODERM 5 %  Generic drug:  lidocaine      TAKE these medications       diazepam 2 MG tablet  Commonly known as:  VALIUM  Take 1 tablet (2 mg total) by mouth every 6 (six) hours as needed for anxiety or muscle spasms.     DSS 100 MG Caps  Take 100  mg by mouth 2 (two) times daily.     esomeprazole 20 MG capsule  Commonly known as:  NEXIUM  Take 20 mg by mouth daily at 12 noon.     hydrochlorothiazide 12.5 MG capsule  Commonly known as:  MICROZIDE  Take 12.5 mg by mouth daily after breakfast.     hydroxypropyl methylcellulose 2.5 % ophthalmic solution  Commonly known as:  ISOPTO TEARS  Place 1 drop into both eyes 2 (two) times daily as needed for dry eyes. Dry eyes     levothyroxine 75 MCG tablet  Commonly known as:  SYNTHROID, LEVOTHROID  Take 75 mcg by mouth daily with breakfast.     loratadine 10 MG tablet  Commonly known as:  CLARITIN  Take 10 mg by mouth daily.     Magnesium 500 MG Tabs  Take 500 mg by mouth daily.     naproxen sodium 220 MG tablet  Commonly known as:  ANAPROX  Take 220 mg by mouth 2 (two) times daily with a meal.     OLUX-E 0.05 % topical foam  Generic drug:  Clobetasol Propionate Emulsion  Apply 1 application topically 2 (two) times daily as  needed. To scalp for Itching     polyethylene glycol powder powder  Commonly known as:  GLYCOLAX/MIRALAX  Take 17 g by mouth daily.     RETIN-A 0.1 % cream  Generic drug:  tretinoin  Apply 1 application topically 2 (two) times a week.     rosuvastatin 10 MG tablet  Commonly known as:  CRESTOR  Take 10 mg by mouth at bedtime.     sertraline 50 MG tablet  Commonly known as:  ZOLOFT  Take 50 mg by mouth Daily.     tapentadol 50 MG Tabs tablet  Commonly known as:  NUCYNTA  Take 1 tablet (50 mg total) by mouth every 4 (four) hours as needed for moderate pain.     VITAMIN D (CHOLECALCIFEROL) PO  Take 1 tablet by mouth daily.         SignedOphelia Charter 11/15/2013, 12:53 PM

## 2013-11-15 NOTE — Progress Notes (Signed)
I have discussed pt case with PTA and agree with updated recommendations.   Jolyn Lent, PT, DPT Acute Rehabilitation Services Pager: [phone]

## 2013-11-15 NOTE — Care Management Note (Signed)
CARE MANAGEMENT NOTE 11/15/2013  Patient:  Kerri [name], Kerri [name]   Account Number:  [id number]  Date Initiated:  11/14/2013  Documentation initiated by:  Lorne Skeens  Subjective/Objective Assessment:   Patient admitted for a lami with decompression.  Lives at home with spouse.     Action/Plan:   Will follow for discharge needs pending PT/OT evals and physician orders   Anticipated DC Date:  11/15/2013   Anticipated DC Plan:  [place]  CM consult      [facility] Choice  HOME HEALTH   Choice offered to / List presented to:  C-1 Patient        [place] arranged  [place] [facility]      [place].   Status of service:  Completed, signed off Medicare Important Message given?  NA - LOS <3 / Initial given by admissions (If response is "NO", the following Medicare IM given date fields will be blank) Date Medicare IM given:  11/03/2013 Date Additional Medicare IM given:    Discharge Disposition:  [place]  Per UR Regulation:  Reviewed for med. necessity/level of care/duration of stay  If discussed at [place] of Stay Meetings, dates discussed:    Comments:  11/15/13 12:57pm Ricki Miller, RN BSN CM Case manager spoke with patient and her daughter Kerri [name] concerning home health and DME needs. Daughter-[name] states her mom has rolling walker, 3in1. Patient will be staying with her daughter Kerri [name] for 2 nights. Will return to her residence on Monday. Choice was offered for home health. Referral called to [facility] with [facility]. patient's cell: [phone], [name] [phone].

## 2013-11-15 NOTE — Progress Notes (Signed)
Patient to be discharged home with daughter with home health PT/OT.  IV removed and education and paperwork complete.  Kizzie Bane, RN

## 2013-11-18 ENCOUNTER — Encounter (HOSPITAL_COMMUNITY): Payer: Self-pay | Admitting: Neurosurgery

## 2014-03-27 ENCOUNTER — Other Ambulatory Visit: Payer: Self-pay

## 2014-03-27 DIAGNOSIS — Z1231 Encounter for screening mammogram for malignant neoplasm of breast: Secondary | ICD-10-CM

## 2014-04-20 ENCOUNTER — Other Ambulatory Visit (INDEPENDENT_AMBULATORY_CARE_PROVIDER_SITE_OTHER): Payer: Self-pay | Admitting: General Surgery

## 2014-04-20 ENCOUNTER — Other Ambulatory Visit (INDEPENDENT_AMBULATORY_CARE_PROVIDER_SITE_OTHER): Payer: Self-pay

## 2014-04-20 DIAGNOSIS — R2241 Localized swelling, mass and lump, right lower limb: Secondary | ICD-10-CM

## 2014-04-20 DIAGNOSIS — R2242 Localized swelling, mass and lump, left lower limb: Secondary | ICD-10-CM

## 2014-04-27 ENCOUNTER — Telehealth (INDEPENDENT_AMBULATORY_CARE_PROVIDER_SITE_OTHER): Payer: Self-pay | Admitting: General Surgery

## 2014-04-27 ENCOUNTER — Ambulatory Visit
Admission: RE | Admit: 2014-04-27 | Discharge: 2014-04-27 | Disposition: A | Discharge status: Home / Self Care | Payer: Medicare Other | Source: Ambulatory Visit

## 2014-04-27 DIAGNOSIS — Z1231 Encounter for screening mammogram for malignant neoplasm of breast: Secondary | ICD-10-CM

## 2014-04-27 NOTE — Telephone Encounter (Signed)
Gave path results

## 2014-07-22 ENCOUNTER — Other Ambulatory Visit: Payer: Self-pay | Admitting: General Surgery

## 2014-08-19 ENCOUNTER — Other Ambulatory Visit: Payer: Self-pay | Admitting: Ophthalmology

## 2014-09-08 ENCOUNTER — Other Ambulatory Visit: Payer: Self-pay | Admitting: Physician Assistant

## 2015-02-08 ENCOUNTER — Encounter (HOSPITAL_COMMUNITY): Payer: Self-pay | Admitting: Emergency Medicine

## 2015-02-08 ENCOUNTER — Emergency Department (HOSPITAL_COMMUNITY): Payer: Medicare Other

## 2015-02-08 ENCOUNTER — Emergency Department (HOSPITAL_COMMUNITY)
Admission: EM | Admit: 2015-02-08 | Discharge: 2015-02-08 | Disposition: A | Discharge status: Home / Self Care | Payer: Medicare Other | Attending: Emergency Medicine | Admitting: Emergency Medicine

## 2015-02-08 DIAGNOSIS — E785 Hyperlipidemia, unspecified: Secondary | ICD-10-CM | POA: Insufficient documentation

## 2015-02-08 DIAGNOSIS — Z85820 Personal history of malignant melanoma of skin: Secondary | ICD-10-CM | POA: Insufficient documentation

## 2015-02-08 DIAGNOSIS — Z87891 Personal history of nicotine dependence: Secondary | ICD-10-CM | POA: Diagnosis not present

## 2015-02-08 DIAGNOSIS — I2510 Atherosclerotic heart disease of native coronary artery without angina pectoris: Secondary | ICD-10-CM | POA: Insufficient documentation

## 2015-02-08 DIAGNOSIS — E039 Hypothyroidism, unspecified: Secondary | ICD-10-CM | POA: Insufficient documentation

## 2015-02-08 DIAGNOSIS — H05011 Cellulitis of right orbit: Secondary | ICD-10-CM | POA: Insufficient documentation

## 2015-02-08 DIAGNOSIS — G8929 Other chronic pain: Secondary | ICD-10-CM | POA: Diagnosis not present

## 2015-02-08 DIAGNOSIS — K219 Gastro-esophageal reflux disease without esophagitis: Secondary | ICD-10-CM | POA: Insufficient documentation

## 2015-02-08 DIAGNOSIS — F329 Major depressive disorder, single episode, unspecified: Secondary | ICD-10-CM | POA: Diagnosis not present

## 2015-02-08 DIAGNOSIS — M1990 Unspecified osteoarthritis, unspecified site: Secondary | ICD-10-CM | POA: Insufficient documentation

## 2015-02-08 DIAGNOSIS — Z79899 Other long term (current) drug therapy: Secondary | ICD-10-CM | POA: Diagnosis not present

## 2015-02-08 DIAGNOSIS — I10 Essential (primary) hypertension: Secondary | ICD-10-CM | POA: Insufficient documentation

## 2015-02-08 DIAGNOSIS — Z791 Long term (current) use of non-steroidal anti-inflammatories (NSAID): Secondary | ICD-10-CM | POA: Diagnosis not present

## 2015-02-08 DIAGNOSIS — L03213 Periorbital cellulitis: Secondary | ICD-10-CM

## 2015-02-08 DIAGNOSIS — H578 Other specified disorders of eye and adnexa: Secondary | ICD-10-CM | POA: Diagnosis present

## 2015-02-08 LAB — CBC
HCT: 37.1 % (ref 36.0–46.0)
Hemoglobin: 11.7 g/dL — ABNORMAL LOW (ref 12.0–15.0)
MCH: 25.9 pg — ABNORMAL LOW (ref 26.0–34.0)
MCHC: 31.5 g/dL (ref 30.0–36.0)
MCV: 82.3 fL (ref 78.0–100.0)
Platelets: 207 10*3/uL (ref 150–400)
RBC: 4.51 MIL/uL (ref 3.87–5.11)
RDW: 16.6 % — ABNORMAL HIGH (ref 11.5–15.5)
WBC: 5.7 10*3/uL (ref 4.0–10.5)

## 2015-02-08 LAB — I-STAT CHEM 8, ED
BUN: 18 mg/dL (ref 6–20)
Calcium, Ion: 1.23 mmol/L (ref 1.13–1.30)
Chloride: 103 mmol/L (ref 101–111)
Creatinine, Ser: 0.8 mg/dL (ref 0.44–1.00)
Glucose, Bld: 91 mg/dL (ref 65–99)
HCT: 37 % (ref 36.0–46.0)
Hemoglobin: 12.6 g/dL (ref 12.0–15.0)
Potassium: 3.9 mmol/L (ref 3.5–5.1)
Sodium: 142 mmol/L (ref 135–145)
TCO2: 29 mmol/L (ref 0–100)

## 2015-02-08 MED ORDER — IOHEXOL 300 MG/ML  SOLN
75.0000 mL | Freq: Once | INTRAMUSCULAR | Status: AC | PRN
  Administered 2015-02-08: 75 mL via INTRAVENOUS

## 2015-02-08 MED ORDER — CLINDAMYCIN HCL 300 MG PO CAPS: 300.0000 mg | ORAL_CAPSULE | Freq: Three times a day (TID) | ORAL | Status: DC

## 2015-02-08 NOTE — ED Notes (Signed)
ED Resident at bedside.

## 2015-02-08 NOTE — Discharge Instructions (Signed)
Please monitor for signs of worsening swelling on antibiotics Take antibiotics for the next 7 days three times a day. Seek help immediately if you have altered mental status, vision changes, fevers. Need to follow-up with doctor who did eye surgery as this may have been a complication from surgery   Periorbital Cellulitis Periorbital cellulitis is a common infection that can affect the eyelid and the soft tissues that surround the eyeball. The infection may also affect the structures that produce and drain tears. It does not affect the eyeball itself. Natural tissue barriers usually prevent the spread of this infection to the eyeball and other deeper areas of the eye socket.  CAUSES  Bacterial infection.  Long-term (chronic) sinus infections.  An object (foreign body) stuck behind the eye.  An injury that goes through the eyelid tissues.  An injury that causes an infection, such as an insect sting.  Fracture of the bone around the eye.  Infections which have spread from the eyelid or other structures around the eye.  Bite wounds.  Inflammation or infection of the lining membranes of the brain (meningitis).  An infection in the blood (septicemia).  Dental infection (abscess).  Viral infection (this is rare). SYMPTOMS Symptoms usually come on suddenly.  Pain in the eye.  Red, hot, and swollen eyelids and possibly cheeks. The swelling is sometimes bad enough that the eyelids cannot open. Some infections make the eyelids look purple.  Fever and feeling generally ill.  Pain when touching the area around the eye. DIAGNOSIS  Periorbital cellulitis can be diagnosed from an eye exam. In severe cases, your caregiver might suggest:  Blood tests.  Imaging tests (such as a CT scan) to examine the sinuses and the area around and behind the eyeball. TREATMENT If your caregiver feels that you do not have any signs of serious infection, treatment may  include:  Antibiotics.  Nasal decongestants to reduce swelling.  Referral to a dentist if it is suspected that the infection was caused by a prior tooth infection.  Examination every day to make sure the problem is improving. HOME CARE INSTRUCTIONS  Take your antibiotics as directed. Finish them even if you start to feel better.  Some pain is normal with this condition. Take pain medicine as directed by your caregiver. Only take pain medicines approved by your caregiver.  It is important to drink fluids. Drink enough water and fluids to keep your urine clear or pale yellow.  Do not smoke.  Rest and get plenty of sleep.  Mild or moderate fevers generally have no long-term effects and often do not require treatment.  If your caregiver has given you a follow-up appointment, it is very important to keep that appointment. Your caregiver will need to make sure that the infection is getting better. It is important to check that a more serious infection is not developing. SEEK IMMEDIATE MEDICAL CARE IF:  Your eyelids become more painful, red, warm, or swollen.  You develop double vision or your vision becomes blurred or worsens in any way.  You have trouble moving your eyes.  The eye looks like it is popping out (proptosis).  You develop a severe headache, severe neck pain, or neck stiffness.  You develop repeated vomiting.  You have a fever or persistent symptoms for more than 72 hours.  You have a fever and your symptoms suddenly get worse. MAKE SURE YOU:  Understand these instructions.  Will watch your condition.  Will get help right away if you are  not doing well or get worse. Document Released: 06/10/2010 Document Revised: 07/31/2011 Document Reviewed: 06/10/2010 [facility] Patient Information 2015 [place], [place]. This information is not intended to replace advice given to you by your health care provider. Make sure you discuss any questions you have with your health care  provider.

## 2015-02-08 NOTE — ED Notes (Signed)
Pt c/o reddened edematous skin around right eye and yellow pustule to right lower eyelid and red edematous skin of right lower eyelid, onset after surgery for Chalazion cyst on Friday. Pt denies vision changes.

## 2015-02-08 NOTE — ED Provider Notes (Signed)
CSN: [id number]     Arrival date & time 02/08/15  1238 History   First MD Initiated Contact with Patient 02/08/15 1538     Chief Complaint  Patient presents with  . Periorbital Cellulitis     HPI Kerri [name] is a 81 y.o. female who presents to the ED with complaints of redness around her left eye. Patient states that 6 days ago she had cyst removal on her right eyelid. This is her third operation to this eye for cyst. Erythema started yesterday and has spread down her face since. Also some associated pus draining from eye. She denies any fevers. Had blurred vision yesterday that has resolved. It is painful to touch.  Of note, patient's been taking care of her sick son which is in hospice for kidney cancer. Daughter states that her mother and brother have been passing infections back and forth to each other.  Past Medical History  Diagnosis Date  . CAD (coronary artery disease)     Chest CTA 10/07: 40% or less pLAD;  ETT 8/09: negative  . Dyslipidemia   . HTN (hypertension)   . DJD (degenerative joint disease)   . Hypothyroidism   . Melanoma     metastatic; left leg; s/p multiple excisions  . Carotid bruit   . Spinal stenosis   . Chronic back pain   . Osteoporosis   . GERD (gastroesophageal reflux disease)   . IBS (irritable bowel syndrome)   . Depression   . Dysrhythmia     pvcs  . Migraine    Past Surgical History  Procedure Laterality Date  . Melanoma excision    . Knee arthroscopy    . Shoulder surgery    . Carpal tunnel release    . Toe surgery    . Back surgery    . Total hip arthroplasty  09/04/2011    Procedure: TOTAL HIP ARTHROPLASTY;  Surgeon: Gearlean Alf, MD;  Location: [facility];  Service: Orthopedics;  Laterality: Left;  Marland Kitchen Melanoma excision Left 09/19/2012    Procedure: WIDE EXCISION METASTATIC MELANOMA LEFT MEDIAL THIGH;  Surgeon: Zenovia Jarred, MD;  Location: [facility];  Service: General;  Laterality: Left;  . Bunionectomy with hammertoe  reconstruction Right 01/16/2013    Procedure: RIGHT Barbie Banner OSTEOTOMY WITH SIMPLE BUNIONECTOMY AND SECOND HAMMER TOE CORRECTION;  Surgeon: Wylene Simmer, MD;  Location: [facility];  Service: Orthopedics;  Laterality: Right;  . Lumbar laminectomy/decompression microdiscectomy N/A 11/13/2013    Procedure: LUMBAR TWO-THREE,LUMBAR THREE-FOUR,LUMBAR FOUR-FIVE LAMINECTOMY AND FORAMINOTOMY;  Surgeon: Ophelia Charter, MD;  Location: [facility];  Service: Neurosurgery;  Laterality: N/A;   History reviewed. No pertinent family history. Social History  Substance Use Topics  . Smoking status: Former Smoker    Quit date: 08/31/1948  . Smokeless tobacco: Never Used  . Alcohol Use: Yes     Comment: occasional drinker   OB History    No data available     Review of Systems  Constitutional: Negative for fever.  HENT: Negative for facial swelling and sinus pressure.   Eyes: Positive for discharge and visual disturbance.  Respiratory: Negative for shortness of breath.   Cardiovascular: Negative for chest pain.  Gastrointestinal: Negative for abdominal pain.  Also per HPI  Allergies  Codeine; Morphine and related; Tramadol; Adhesive; Hydrocodone-acetaminophen; Lactose intolerance (gi); and Oxycodone  Home Medications   Prior to Admission medications   Medication Sig Start Date End Date Taking? Authorizing Provider  esomeprazole ([place]) 20  MG capsule Take 20 mg by mouth daily at 12 noon.   Yes Historical Provider, MD  hydrochlorothiazide (MICROZIDE) 12.5 MG capsule Take 12.5 mg by mouth daily after breakfast.  04/11/11  Yes Historical Provider, MD  hydroxypropyl methylcellulose (ISOPTO TEARS) 2.5 % ophthalmic solution Place 1 drop into both eyes 2 (two) times daily. Dry eyes   Yes Historical Provider, MD  levothyroxine (SYNTHROID, LEVOTHROID) 75 MCG tablet Take 75 mcg by mouth daily with breakfast.   Yes Historical Provider, MD  loratadine (CLARITIN) 10 MG tablet Take 10 mg by mouth  daily.    Yes Historical Provider, MD  Magnesium 500 MG TABS Take 500 mg by mouth daily.   Yes Historical Provider, MD  naproxen sodium (ANAPROX) 220 MG tablet Take 220 mg by mouth 2 (two) times daily with a meal.   Yes Historical Provider, MD  polyethylene glycol powder (GLYCOLAX/MIRALAX) powder Take 17 g by mouth at bedtime.    Yes Historical Provider, MD  RETIN-A 0.1 % cream Apply 1 application topically 2 (two) times a week.  03/26/12  Yes Historical Provider, MD  rosuvastatin (CRESTOR) 10 MG tablet Take 10 mg by mouth at bedtime.    Yes Historical Provider, MD  VITAMIN D, CHOLECALCIFEROL, PO Take 1 tablet by mouth daily.   Yes Historical Provider, MD  clindamycin (CLEOCIN) 300 MG capsule Take 1 capsule (300 mg total) by mouth 3 (three) times daily. 02/08/15   Katheren Shams, DO  diazepam (VALIUM) 2 MG tablet Take 1 tablet (2 mg total) by mouth every 6 (six) hours as needed for anxiety or muscle spasms. Patient not taking: Reported on 02/08/2015 11/15/13   Newman Pies, MD  docusate sodium 100 MG CAPS Take 100 mg by mouth 2 (two) times daily. Patient not taking: Reported on 02/08/2015 11/15/13   Newman Pies, MD  OLUX-E 0.05 % topical foam Apply 1 application topically 2 (two) times daily as needed. To scalp for Itching 05/02/11   Historical Provider, MD  tapentadol (NUCYNTA) 50 MG TABS tablet Take 1 tablet (50 mg total) by mouth every 4 (four) hours as needed for moderate pain. Patient not taking: Reported on 02/08/2015 11/15/13   Newman Pies, MD   BP 153/79 mmHg  Pulse 64  Temp(Src) 97.4 F (36.3 C) (Oral)  Resp 18  SpO2 96% Physical Exam  Constitutional: She is oriented to person, place, and time. She appears well-developed and well-nourished. No distress.  HENT:  Head: Normocephalic and atraumatic. Head is with right periorbital erythema.  Mouth/Throat: Oropharynx is clear and moist.  Eyes: EOM are normal. Pupils are equal, round, and reactive to light. Right eye exhibits  discharge. Right conjunctiva is injected.  Neck: Normal range of motion. Neck supple.  Cardiovascular: Normal rate, regular rhythm and intact distal pulses.   Pulmonary/Chest: Effort normal and breath sounds normal.  Abdominal: Soft. Bowel sounds are normal. There is no tenderness.  Lymphadenopathy:    She has no cervical adenopathy.  Neurological: She is alert and oriented to person, place, and time.  Skin: Skin is warm and dry.  Psychiatric: She has a normal mood and affect.    ED Course  Procedures (including critical care time) Labs Review Labs Reviewed  CBC - Abnormal; Notable for the following:    Hemoglobin 11.7 (*)    MCH 25.9 (*)    RDW 16.6 (*)    All other components within normal limits  I-STAT CHEM 8, ED    Imaging Review Ct Orbits W/cm  02/08/2015  CLINICAL DATA:  81 year old female with right periorbital cellulitis following chalazion surgery on 3 days ago.  EXAM: CT ORBITS WITH CONTRAST  TECHNIQUE: Multidetector CT imaging of the orbits was performed following the bolus administration of intravenous contrast.  CONTRAST:  21mL OMNIPAQUE IOHEXOL 300 MG/ML  SOLN  COMPARISON:  04/25/2013 head CT  FINDINGS: Mild right supraorbital soft tissue swelling is noted. There is no evidence of discrete abscess. The orbits and globes are otherwise unremarkable. The globes retain their spherical shape. No postseptal or intraconal abnormality identified.  The paranasal sinuses, mastoid air cells and middle/ inner ears are clear.  There is no evidence of mass or enlarged lymph nodes.  The vascular structures are within normal limits.  The visualized portions of brain are unremarkable.  IMPRESSION: Mild right supraorbital soft tissue swelling/ cellulitis without abscess.  No other significant abnormalities.   Electronically Signed   By: Margarette Canada M.D.   On: 02/08/2015 17:34   I have personally reviewed and evaluated these images and lab results as part of my medical decision-making.    EKG Interpretation None      MDM   Final diagnoses:  Periorbital cellulitis of right eye   Patient presented to the ED with right periorbital erythema 2 days with associated pus. Erythema is spreading down her right cheek. Patient without associated fevers or malaise. Vital signs stable.  In ED CT ORBIT obtained showing soft tissue swelling consistent with cellulitis. No abscess seen. Blood work overall unremarkable.  Will discharge patient home with clindamycin for cellulitis. She was encouraged to follow-up with her ophthalmologist who performed the surgery.   Luiz Blare, DO 02/08/2015, 7:29 PM PGY-2, [place], DO 02/08/15 1933  Leonard Schwartz, MD 02/08/15 [phone]

## 2015-02-08 NOTE — ED Notes (Signed)
Patient transported to CT 

## 2015-04-09 ENCOUNTER — Other Ambulatory Visit: Payer: Self-pay | Admitting: Family Medicine

## 2015-04-09 ENCOUNTER — Ambulatory Visit
Admission: RE | Admit: 2015-04-09 | Discharge: 2015-04-09 | Disposition: A | Discharge status: Home / Self Care | Payer: Medicare Other | Source: Ambulatory Visit | Attending: Family Medicine | Admitting: Family Medicine

## 2015-04-09 DIAGNOSIS — M545 Low back pain: Secondary | ICD-10-CM

## 2015-04-14 ENCOUNTER — Other Ambulatory Visit: Payer: Self-pay

## 2015-04-14 DIAGNOSIS — Z1231 Encounter for screening mammogram for malignant neoplasm of breast: Secondary | ICD-10-CM

## 2015-04-23 ENCOUNTER — Ambulatory Visit
Admission: RE | Admit: 2015-04-23 | Discharge: 2015-04-23 | Disposition: A | Discharge status: Home / Self Care | Payer: Medicare Other | Source: Ambulatory Visit | Attending: Internal Medicine | Admitting: Internal Medicine

## 2015-04-23 ENCOUNTER — Other Ambulatory Visit: Payer: Self-pay | Admitting: Internal Medicine

## 2015-04-23 DIAGNOSIS — M79671 Pain in right foot: Secondary | ICD-10-CM

## 2015-05-10 ENCOUNTER — Other Ambulatory Visit: Payer: Self-pay | Admitting: Internal Medicine

## 2015-05-10 ENCOUNTER — Ambulatory Visit
Admission: RE | Admit: 2015-05-10 | Discharge: 2015-05-10 | Disposition: A | Discharge status: Home / Self Care | Payer: Medicare Other | Source: Ambulatory Visit

## 2015-05-10 DIAGNOSIS — M545 Low back pain: Secondary | ICD-10-CM

## 2015-05-10 DIAGNOSIS — Z1231 Encounter for screening mammogram for malignant neoplasm of breast: Secondary | ICD-10-CM

## 2015-05-28 ENCOUNTER — Ambulatory Visit
Admission: RE | Admit: 2015-05-28 | Discharge: 2015-05-28 | Disposition: A | Discharge status: Home / Self Care | Payer: Medicare Other | Source: Ambulatory Visit | Attending: Internal Medicine | Admitting: Internal Medicine

## 2015-05-28 DIAGNOSIS — M545 Low back pain: Secondary | ICD-10-CM

## 2015-06-20 NOTE — Progress Notes (Signed)
HPI The patient presents for preoperative evaluation.  I have seen her in the past for palpitations and HTN.  She has had mild coronary disease.  She has had no recent cardiac symptoms. She came back today to talk about this. She's had no presyncope or syncope. She denies any chest pressure, neck or arm discomfort. She's had no new shortness of breath, PND or orthopnea. She is limited by back and leg pain. Unfortunately both her daughter and her son died since I last saw her. She is naturally very emotional about this. Coincidentally this morning when showering she had some bright red blood in the shower area she seen some spotting when she uses toilet paper but this seemed to be more than that. She's not having any abdominal pain. She has no other GI complaints. She's had no presyncope or syncope. She's had no weight loss.  Allergies  Allergen Reactions  . Buprenorphine Hcl Itching    Happened a while back. Patient noted that any narcotic based medication makes her have a bad reaction.  . Codeine Nausea And Vomiting    Patient gets sick on stomach  . Morphine And Related Itching    Happened a while back. Patient noted that any narcotic based medication makes her have a bad reaction.  . Tramadol Nausea And Vomiting and Other (See Comments)    confusion  . Other Other (See Comments)  . Adhesive [Tape] Other (See Comments)    Tears skin   . Hydrocodone-Acetaminophen Nausea And Vomiting  . Lactose Intolerance (Gi) Other (See Comments)    Gi upset   . Oxycodone Nausea And Vomiting    Current Outpatient Prescriptions  Medication Sig Dispense Refill  . clindamycin (CLEOCIN) 300 MG capsule Take 1 capsule (300 mg total) by mouth 3 (three) times daily. 21 capsule 0  . esomeprazole (NEXIUM) 20 MG capsule Take 20 mg by mouth daily at 12 noon.    . hydrochlorothiazide (MICROZIDE) 12.5 MG capsule Take 12.5 mg by mouth daily after breakfast.     . hydroxypropyl methylcellulose (ISOPTO TEARS) 2.5  % ophthalmic solution Place 1 drop into both eyes 2 (two) times daily. Dry eyes    . levothyroxine (SYNTHROID, LEVOTHROID) 75 MCG tablet Take 75 mcg by mouth daily with breakfast.    . loratadine (CLARITIN) 10 MG tablet Take 10 mg by mouth daily.     . Magnesium 500 MG TABS Take 500 mg by mouth daily.    . naproxen sodium (ANAPROX) 220 MG tablet Take 220 mg by mouth 2 (two) times daily with a meal.    . OLUX-E 0.05 % topical foam Apply 1 application topically 2 (two) times daily as needed. To scalp for Itching    . polyethylene glycol powder (GLYCOLAX/MIRALAX) powder Take 17 g by mouth at bedtime.     Marland Kitchen RETIN-A 0.1 % cream Apply 1 application topically 2 (two) times a week.     . rosuvastatin (CRESTOR) 10 MG tablet Take 10 mg by mouth at bedtime.     Marland Kitchen VITAMIN D, CHOLECALCIFEROL, PO Take 1 tablet by mouth daily.     No current facility-administered medications for this visit.    Past Medical History  Diagnosis Date  . CAD (coronary artery disease)     Chest CTA 10/07: 40% or less pLAD;  ETT 8/09: negative  . Dyslipidemia   . HTN (hypertension)   . DJD (degenerative joint disease)   . Hypothyroidism   . Melanoma ([place])  metastatic; left leg; s/p multiple excisions  . Carotid bruit   . Spinal stenosis   . Chronic back pain   . Osteoporosis   . GERD (gastroesophageal reflux disease)   . IBS (irritable bowel syndrome)   . Depression   . Dysrhythmia     pvcs  . Migraine     Past Surgical History  Procedure Laterality Date  . Melanoma excision    . Knee arthroscopy    . Shoulder surgery    . Carpal tunnel release    . Toe surgery    . Back surgery    . Total hip arthroplasty  09/04/2011    Procedure: TOTAL HIP ARTHROPLASTY;  Surgeon: Gearlean Alf, MD;  Location: [facility];  Service: Orthopedics;  Laterality: Left;  Marland Kitchen Melanoma excision Left 09/19/2012    Procedure: WIDE EXCISION METASTATIC MELANOMA LEFT MEDIAL THIGH;  Surgeon: Zenovia Jarred, MD;  Location: [facility];  Service: General;  Laterality: Left;  . Bunionectomy with hammertoe reconstruction Right 01/16/2013    Procedure: RIGHT Barbie Banner OSTEOTOMY WITH SIMPLE BUNIONECTOMY AND SECOND HAMMER TOE CORRECTION;  Surgeon: Wylene Simmer, MD;  Location: [facility];  Service: Orthopedics;  Laterality: Right;  . Lumbar laminectomy/decompression microdiscectomy N/A 11/13/2013    Procedure: LUMBAR TWO-THREE,LUMBAR THREE-FOUR,LUMBAR FOUR-FIVE LAMINECTOMY AND FORAMINOTOMY;  Surgeon: Ophelia Charter, MD;  Location: [facility];  Service: Neurosurgery;  Laterality: N/A;    ROS: Back pain and left leg pian.  Otherwise stated in the HPI and negative for all other systems.  PHYSICAL EXAM BP 110/70 mmHg  Pulse 74  Ht 5\' 4"  (1.626 m)  Wt 134 lb (60.782 kg)  BMI 22.99 kg/m2 GENERAL:  Well appearing LUNGS:  Clear to auscultation bilaterally CHEST:  Unremarkable HEART:  PMI not displaced or sustained,S1 and S2 within normal limits, no S3, no S4, no clicks, no rubs, no murmurs ABD:  Flat, positive bowel sounds normal in frequency in pitch, no bruits, no rebound, no guarding, no midline pulsatile mass, no hepatomegaly, no splenomegaly EXT:  2 plus pulses throughout, no edema, no cyanosis no clubbing   EKG:  NSR, rate 74, axis WNL, intervals WNL, PVCs, PACS, no acute ST T wave changes.  06/21/2015  ASSESSMENT AND PLAN   HYPERTENSION  Her blood pressure today is well controlled.  We reviewed this again. No change in therapy is indicated.   GI BLEED She reports bright red bleeding when she was taking a shower today. I'm going to check a CBC.   I spoke with her Irven Shelling, MD today and he will arrange follow up of the bleeding.    PVCs She is not describing feeling these.  No change in therapy is indicated.

## 2015-06-21 ENCOUNTER — Encounter: Payer: Self-pay | Admitting: Cardiology

## 2015-06-21 ENCOUNTER — Ambulatory Visit (INDEPENDENT_AMBULATORY_CARE_PROVIDER_SITE_OTHER): Payer: Medicare Other | Admitting: Cardiology

## 2015-06-21 VITALS — BP 110/70 | HR 74 | Ht 64.0 in | Wt 134.0 lb

## 2015-06-21 DIAGNOSIS — K922 Gastrointestinal hemorrhage, unspecified: Secondary | ICD-10-CM

## 2015-06-21 DIAGNOSIS — I2583 Coronary atherosclerosis due to lipid rich plaque: Secondary | ICD-10-CM

## 2015-06-21 DIAGNOSIS — I2510 Atherosclerotic heart disease of native coronary artery without angina pectoris: Secondary | ICD-10-CM

## 2015-06-21 LAB — CBC
HCT: 44.5 % (ref 36.0–46.0)
Hemoglobin: 14.6 g/dL (ref 12.0–15.0)
MCH: 27.4 pg (ref 26.0–34.0)
MCHC: 32.8 g/dL (ref 30.0–36.0)
MCV: 83.5 fL (ref 78.0–100.0)
MPV: 11.3 fL (ref 8.6–12.4)
Platelets: 214 10*3/uL (ref 150–400)
RBC: 5.33 MIL/uL — ABNORMAL HIGH (ref 3.87–5.11)
RDW: 17 % — ABNORMAL HIGH (ref 11.5–15.5)
WBC: 8 10*3/uL (ref 4.0–10.5)

## 2015-06-21 NOTE — Patient Instructions (Addendum)
Your physician recommends that you return for lab work in: [place].  Your physician wants you to follow-up in: 1 YEAR OR SOONER IF NEEDED. You will receive a reminder letter in the mail two months in advance. If you don't receive a letter, please call our office to schedule the follow-up appointment.   If you need a refill on your cardiac medications before your next appointment, please call your pharmacy.

## 2015-06-23 DIAGNOSIS — M79641 Pain in right hand: Secondary | ICD-10-CM | POA: Diagnosis not present

## 2015-06-23 DIAGNOSIS — G5601 Carpal tunnel syndrome, right upper limb: Secondary | ICD-10-CM | POA: Diagnosis not present

## 2015-06-30 DIAGNOSIS — M1712 Unilateral primary osteoarthritis, left knee: Secondary | ICD-10-CM | POA: Diagnosis not present

## 2015-06-30 DIAGNOSIS — M1711 Unilateral primary osteoarthritis, right knee: Secondary | ICD-10-CM | POA: Diagnosis not present

## 2015-06-30 DIAGNOSIS — M170 Bilateral primary osteoarthritis of knee: Secondary | ICD-10-CM | POA: Diagnosis not present

## 2015-06-30 DIAGNOSIS — L570 Actinic keratosis: Secondary | ICD-10-CM | POA: Diagnosis not present

## 2015-07-07 DIAGNOSIS — M170 Bilateral primary osteoarthritis of knee: Secondary | ICD-10-CM | POA: Diagnosis not present

## 2015-07-07 DIAGNOSIS — M1711 Unilateral primary osteoarthritis, right knee: Secondary | ICD-10-CM | POA: Diagnosis not present

## 2015-07-07 DIAGNOSIS — M1712 Unilateral primary osteoarthritis, left knee: Secondary | ICD-10-CM | POA: Diagnosis not present

## 2015-07-14 DIAGNOSIS — M170 Bilateral primary osteoarthritis of knee: Secondary | ICD-10-CM | POA: Diagnosis not present

## 2015-07-14 DIAGNOSIS — M1711 Unilateral primary osteoarthritis, right knee: Secondary | ICD-10-CM | POA: Diagnosis not present

## 2015-07-14 DIAGNOSIS — M1712 Unilateral primary osteoarthritis, left knee: Secondary | ICD-10-CM | POA: Diagnosis not present

## 2015-07-19 DIAGNOSIS — K219 Gastro-esophageal reflux disease without esophagitis: Secondary | ICD-10-CM | POA: Diagnosis not present

## 2015-07-19 DIAGNOSIS — I10 Essential (primary) hypertension: Secondary | ICD-10-CM | POA: Diagnosis not present

## 2015-07-20 DIAGNOSIS — H6123 Impacted cerumen, bilateral: Secondary | ICD-10-CM | POA: Diagnosis not present

## 2015-07-21 DIAGNOSIS — M170 Bilateral primary osteoarthritis of knee: Secondary | ICD-10-CM | POA: Diagnosis not present

## 2015-07-21 DIAGNOSIS — M1712 Unilateral primary osteoarthritis, left knee: Secondary | ICD-10-CM | POA: Diagnosis not present

## 2015-07-21 DIAGNOSIS — M1711 Unilateral primary osteoarthritis, right knee: Secondary | ICD-10-CM | POA: Diagnosis not present

## 2015-07-28 DIAGNOSIS — M1712 Unilateral primary osteoarthritis, left knee: Secondary | ICD-10-CM | POA: Diagnosis not present

## 2015-07-28 DIAGNOSIS — M1711 Unilateral primary osteoarthritis, right knee: Secondary | ICD-10-CM | POA: Diagnosis not present

## 2015-07-28 DIAGNOSIS — M170 Bilateral primary osteoarthritis of knee: Secondary | ICD-10-CM | POA: Diagnosis not present

## 2015-07-29 DIAGNOSIS — Z86010 Personal history of colonic polyps: Secondary | ICD-10-CM | POA: Diagnosis not present

## 2015-08-20 DIAGNOSIS — D492 Neoplasm of unspecified behavior of bone, soft tissue, and skin: Secondary | ICD-10-CM | POA: Diagnosis not present

## 2015-08-20 DIAGNOSIS — L928 Other granulomatous disorders of the skin and subcutaneous tissue: Secondary | ICD-10-CM | POA: Diagnosis not present

## 2015-09-01 ENCOUNTER — Other Ambulatory Visit: Payer: Self-pay | Admitting: Nurse Practitioner

## 2015-09-01 DIAGNOSIS — R1011 Right upper quadrant pain: Secondary | ICD-10-CM

## 2015-09-02 ENCOUNTER — Other Ambulatory Visit: Payer: Self-pay | Admitting: Physician Assistant

## 2015-09-02 DIAGNOSIS — C4492 Squamous cell carcinoma of skin, unspecified: Secondary | ICD-10-CM

## 2015-09-02 DIAGNOSIS — L570 Actinic keratosis: Secondary | ICD-10-CM | POA: Diagnosis not present

## 2015-09-02 DIAGNOSIS — D045 Carcinoma in situ of skin of trunk: Secondary | ICD-10-CM | POA: Diagnosis not present

## 2015-09-02 HISTORY — DX: Squamous cell carcinoma of skin, unspecified: C44.92

## 2015-09-07 ENCOUNTER — Ambulatory Visit
Admission: RE | Admit: 2015-09-07 | Discharge: 2015-09-07 | Disposition: A | Discharge status: Home / Self Care | Payer: Medicare Other | Source: Ambulatory Visit | Attending: Nurse Practitioner | Admitting: Nurse Practitioner

## 2015-09-07 DIAGNOSIS — R1011 Right upper quadrant pain: Secondary | ICD-10-CM

## 2015-09-07 DIAGNOSIS — K760 Fatty (change of) liver, not elsewhere classified: Secondary | ICD-10-CM | POA: Diagnosis not present

## 2015-09-07 MED ORDER — IOPAMIDOL (ISOVUE-300) INJECTION 61%
100.0000 mL | Freq: Once | INTRAVENOUS | Status: AC | PRN
  Administered 2015-09-07: 100 mL via INTRAVENOUS

## 2015-09-29 DIAGNOSIS — D045 Carcinoma in situ of skin of trunk: Secondary | ICD-10-CM | POA: Diagnosis not present

## 2015-10-27 DIAGNOSIS — L570 Actinic keratosis: Secondary | ICD-10-CM | POA: Diagnosis not present

## 2015-10-27 DIAGNOSIS — I8390 Asymptomatic varicose veins of unspecified lower extremity: Secondary | ICD-10-CM | POA: Diagnosis not present

## 2015-12-08 ENCOUNTER — Other Ambulatory Visit: Payer: Self-pay | Admitting: Physician Assistant

## 2015-12-08 DIAGNOSIS — L309 Dermatitis, unspecified: Secondary | ICD-10-CM | POA: Diagnosis not present

## 2015-12-08 DIAGNOSIS — L570 Actinic keratosis: Secondary | ICD-10-CM | POA: Diagnosis not present

## 2015-12-14 DIAGNOSIS — M545 Low back pain: Secondary | ICD-10-CM | POA: Diagnosis not present

## 2015-12-29 DIAGNOSIS — E039 Hypothyroidism, unspecified: Secondary | ICD-10-CM | POA: Diagnosis not present

## 2015-12-29 DIAGNOSIS — Z7189 Other specified counseling: Secondary | ICD-10-CM | POA: Diagnosis not present

## 2015-12-29 DIAGNOSIS — I10 Essential (primary) hypertension: Secondary | ICD-10-CM | POA: Diagnosis not present

## 2015-12-29 DIAGNOSIS — E782 Mixed hyperlipidemia: Secondary | ICD-10-CM | POA: Diagnosis not present

## 2015-12-29 DIAGNOSIS — K219 Gastro-esophageal reflux disease without esophagitis: Secondary | ICD-10-CM | POA: Diagnosis not present

## 2016-01-09 DIAGNOSIS — H5711 Ocular pain, right eye: Secondary | ICD-10-CM | POA: Diagnosis not present

## 2016-01-10 DIAGNOSIS — S0501XA Injury of conjunctiva and corneal abrasion without foreign body, right eye, initial encounter: Secondary | ICD-10-CM | POA: Diagnosis not present

## 2016-01-12 DIAGNOSIS — M170 Bilateral primary osteoarthritis of knee: Secondary | ICD-10-CM | POA: Diagnosis not present

## 2016-01-12 DIAGNOSIS — M1711 Unilateral primary osteoarthritis, right knee: Secondary | ICD-10-CM | POA: Diagnosis not present

## 2016-01-12 DIAGNOSIS — M1712 Unilateral primary osteoarthritis, left knee: Secondary | ICD-10-CM | POA: Diagnosis not present

## 2016-02-18 DIAGNOSIS — M170 Bilateral primary osteoarthritis of knee: Secondary | ICD-10-CM | POA: Diagnosis not present

## 2016-02-18 DIAGNOSIS — M79671 Pain in right foot: Secondary | ICD-10-CM | POA: Diagnosis not present

## 2016-02-25 DIAGNOSIS — M170 Bilateral primary osteoarthritis of knee: Secondary | ICD-10-CM | POA: Diagnosis not present

## 2016-03-03 DIAGNOSIS — M170 Bilateral primary osteoarthritis of knee: Secondary | ICD-10-CM | POA: Diagnosis not present

## 2016-03-10 DIAGNOSIS — M170 Bilateral primary osteoarthritis of knee: Secondary | ICD-10-CM | POA: Diagnosis not present

## 2016-03-14 DIAGNOSIS — D1800 Hemangioma unspecified site: Secondary | ICD-10-CM | POA: Diagnosis not present

## 2016-03-14 DIAGNOSIS — D239 Other benign neoplasm of skin, unspecified: Secondary | ICD-10-CM | POA: Diagnosis not present

## 2016-03-17 DIAGNOSIS — M170 Bilateral primary osteoarthritis of knee: Secondary | ICD-10-CM | POA: Diagnosis not present

## 2016-04-04 ENCOUNTER — Other Ambulatory Visit: Payer: Self-pay | Admitting: Internal Medicine

## 2016-04-04 DIAGNOSIS — Z1231 Encounter for screening mammogram for malignant neoplasm of breast: Secondary | ICD-10-CM

## 2016-04-07 DIAGNOSIS — I10 Essential (primary) hypertension: Secondary | ICD-10-CM | POA: Diagnosis not present

## 2016-04-07 DIAGNOSIS — Z23 Encounter for immunization: Secondary | ICD-10-CM | POA: Diagnosis not present

## 2016-05-08 DIAGNOSIS — H9042 Sensorineural hearing loss, unilateral, left ear, with unrestricted hearing on the contralateral side: Secondary | ICD-10-CM | POA: Diagnosis not present

## 2016-05-11 ENCOUNTER — Ambulatory Visit
Admission: RE | Admit: 2016-05-11 | Discharge: 2016-05-11 | Disposition: A | Discharge status: Home / Self Care | Payer: Medicare Other | Source: Ambulatory Visit | Attending: Internal Medicine | Admitting: Internal Medicine

## 2016-05-11 DIAGNOSIS — Z1231 Encounter for screening mammogram for malignant neoplasm of breast: Secondary | ICD-10-CM | POA: Diagnosis not present

## 2016-05-17 DIAGNOSIS — K130 Diseases of lips: Secondary | ICD-10-CM | POA: Diagnosis not present

## 2016-06-13 DIAGNOSIS — R030 Elevated blood-pressure reading, without diagnosis of hypertension: Secondary | ICD-10-CM | POA: Diagnosis not present

## 2016-06-13 DIAGNOSIS — H9042 Sensorineural hearing loss, unilateral, left ear, with unrestricted hearing on the contralateral side: Secondary | ICD-10-CM | POA: Diagnosis not present

## 2016-06-13 DIAGNOSIS — M545 Low back pain: Secondary | ICD-10-CM | POA: Diagnosis not present

## 2016-06-14 ENCOUNTER — Other Ambulatory Visit (INDEPENDENT_AMBULATORY_CARE_PROVIDER_SITE_OTHER): Payer: Self-pay | Admitting: Otolaryngology

## 2016-06-14 DIAGNOSIS — H918X9 Other specified hearing loss, unspecified ear: Secondary | ICD-10-CM

## 2016-06-20 ENCOUNTER — Ambulatory Visit
Admission: RE | Admit: 2016-06-20 | Discharge: 2016-06-20 | Disposition: A | Discharge status: Home / Self Care | Payer: Medicare Other | Source: Ambulatory Visit | Attending: Otolaryngology | Admitting: Otolaryngology

## 2016-06-20 DIAGNOSIS — R220 Localized swelling, mass and lump, head: Secondary | ICD-10-CM | POA: Diagnosis not present

## 2016-06-20 DIAGNOSIS — H918X9 Other specified hearing loss, unspecified ear: Secondary | ICD-10-CM

## 2016-06-20 MED ORDER — GADOBENATE DIMEGLUMINE 529 MG/ML IV SOLN
12.0000 mL | Freq: Once | INTRAVENOUS | Status: AC | PRN
  Administered 2016-06-20: 12 mL via INTRAVENOUS

## 2016-07-06 DIAGNOSIS — H01005 Unspecified blepharitis left lower eyelid: Secondary | ICD-10-CM | POA: Diagnosis not present

## 2016-07-06 DIAGNOSIS — H01002 Unspecified blepharitis right lower eyelid: Secondary | ICD-10-CM | POA: Diagnosis not present

## 2016-07-06 DIAGNOSIS — H01004 Unspecified blepharitis left upper eyelid: Secondary | ICD-10-CM | POA: Diagnosis not present

## 2016-07-06 DIAGNOSIS — H01001 Unspecified blepharitis right upper eyelid: Secondary | ICD-10-CM | POA: Diagnosis not present

## 2016-07-19 ENCOUNTER — Other Ambulatory Visit: Payer: Self-pay | Admitting: Gastroenterology

## 2016-07-19 DIAGNOSIS — R3981 Functional urinary incontinence: Secondary | ICD-10-CM | POA: Diagnosis not present

## 2016-07-19 DIAGNOSIS — R140 Abdominal distension (gaseous): Secondary | ICD-10-CM | POA: Diagnosis not present

## 2016-07-19 DIAGNOSIS — R159 Full incontinence of feces: Secondary | ICD-10-CM | POA: Diagnosis not present

## 2016-07-27 ENCOUNTER — Ambulatory Visit
Admission: RE | Admit: 2016-07-27 | Discharge: 2016-07-27 | Disposition: A | Discharge status: Home / Self Care | Payer: Medicare Other | Source: Ambulatory Visit | Attending: Gastroenterology | Admitting: Gastroenterology

## 2016-07-27 DIAGNOSIS — H01001 Unspecified blepharitis right upper eyelid: Secondary | ICD-10-CM | POA: Diagnosis not present

## 2016-07-27 DIAGNOSIS — H01005 Unspecified blepharitis left lower eyelid: Secondary | ICD-10-CM | POA: Diagnosis not present

## 2016-07-27 DIAGNOSIS — R140 Abdominal distension (gaseous): Secondary | ICD-10-CM | POA: Diagnosis not present

## 2016-07-27 DIAGNOSIS — H01002 Unspecified blepharitis right lower eyelid: Secondary | ICD-10-CM | POA: Diagnosis not present

## 2016-07-27 DIAGNOSIS — H01004 Unspecified blepharitis left upper eyelid: Secondary | ICD-10-CM | POA: Diagnosis not present

## 2016-09-01 DIAGNOSIS — M170 Bilateral primary osteoarthritis of knee: Secondary | ICD-10-CM | POA: Diagnosis not present

## 2016-09-05 DIAGNOSIS — M1812 Unilateral primary osteoarthritis of first carpometacarpal joint, left hand: Secondary | ICD-10-CM | POA: Diagnosis not present

## 2016-09-05 DIAGNOSIS — M778 Other enthesopathies, not elsewhere classified: Secondary | ICD-10-CM | POA: Diagnosis not present

## 2016-09-27 DIAGNOSIS — M170 Bilateral primary osteoarthritis of knee: Secondary | ICD-10-CM | POA: Diagnosis not present

## 2016-10-03 DIAGNOSIS — M1812 Unilateral primary osteoarthritis of first carpometacarpal joint, left hand: Secondary | ICD-10-CM | POA: Diagnosis not present

## 2016-10-03 DIAGNOSIS — M778 Other enthesopathies, not elsewhere classified: Secondary | ICD-10-CM | POA: Diagnosis not present

## 2016-10-04 DIAGNOSIS — M170 Bilateral primary osteoarthritis of knee: Secondary | ICD-10-CM | POA: Diagnosis not present

## 2016-10-10 DIAGNOSIS — M170 Bilateral primary osteoarthritis of knee: Secondary | ICD-10-CM | POA: Diagnosis not present

## 2016-10-12 ENCOUNTER — Other Ambulatory Visit: Payer: Self-pay | Admitting: Nurse Practitioner

## 2016-10-12 ENCOUNTER — Ambulatory Visit
Admission: RE | Admit: 2016-10-12 | Discharge: 2016-10-12 | Disposition: A | Discharge status: Home / Self Care | Payer: Medicare Other | Source: Ambulatory Visit | Attending: Nurse Practitioner | Admitting: Nurse Practitioner

## 2016-10-12 ENCOUNTER — Encounter: Payer: Self-pay | Admitting: Internal Medicine

## 2016-10-12 DIAGNOSIS — M545 Low back pain: Secondary | ICD-10-CM | POA: Diagnosis not present

## 2016-10-12 DIAGNOSIS — M810 Age-related osteoporosis without current pathological fracture: Secondary | ICD-10-CM | POA: Diagnosis not present

## 2016-10-12 DIAGNOSIS — M48061 Spinal stenosis, lumbar region without neurogenic claudication: Secondary | ICD-10-CM | POA: Diagnosis not present

## 2016-10-12 DIAGNOSIS — W19XXXA Unspecified fall, initial encounter: Secondary | ICD-10-CM | POA: Diagnosis not present

## 2016-10-18 DIAGNOSIS — M170 Bilateral primary osteoarthritis of knee: Secondary | ICD-10-CM | POA: Diagnosis not present

## 2016-10-26 DIAGNOSIS — M5431 Sciatica, right side: Secondary | ICD-10-CM | POA: Diagnosis not present

## 2016-10-26 DIAGNOSIS — R030 Elevated blood-pressure reading, without diagnosis of hypertension: Secondary | ICD-10-CM | POA: Diagnosis not present

## 2016-11-07 ENCOUNTER — Other Ambulatory Visit: Payer: Self-pay | Admitting: Neurosurgery

## 2016-11-07 DIAGNOSIS — M5416 Radiculopathy, lumbar region: Secondary | ICD-10-CM

## 2016-11-23 ENCOUNTER — Ambulatory Visit
Admission: RE | Admit: 2016-11-23 | Discharge: 2016-11-23 | Disposition: A | Discharge status: Home / Self Care | Payer: Medicare Other | Source: Ambulatory Visit | Attending: Neurosurgery | Admitting: Neurosurgery

## 2016-11-23 DIAGNOSIS — S3992XA Unspecified injury of lower back, initial encounter: Secondary | ICD-10-CM | POA: Diagnosis not present

## 2016-11-23 DIAGNOSIS — M5416 Radiculopathy, lumbar region: Secondary | ICD-10-CM

## 2016-11-23 MED ORDER — GADOBENATE DIMEGLUMINE 529 MG/ML IV SOLN
12.0000 mL | Freq: Once | INTRAVENOUS | Status: AC | PRN
Start: 2016-11-23 — End: 2016-11-23
  Administered 2016-11-23: 12 mL via INTRAVENOUS

## 2016-12-05 DIAGNOSIS — L719 Rosacea, unspecified: Secondary | ICD-10-CM | POA: Diagnosis not present

## 2016-12-05 DIAGNOSIS — L570 Actinic keratosis: Secondary | ICD-10-CM | POA: Diagnosis not present

## 2016-12-05 DIAGNOSIS — D229 Melanocytic nevi, unspecified: Secondary | ICD-10-CM | POA: Diagnosis not present

## 2016-12-12 ENCOUNTER — Encounter: Payer: Self-pay | Admitting: *Deleted

## 2016-12-19 ENCOUNTER — Encounter: Payer: Self-pay | Admitting: Internal Medicine

## 2016-12-19 ENCOUNTER — Ambulatory Visit (INDEPENDENT_AMBULATORY_CARE_PROVIDER_SITE_OTHER): Payer: Medicare Other | Admitting: Internal Medicine

## 2016-12-19 ENCOUNTER — Encounter (INDEPENDENT_AMBULATORY_CARE_PROVIDER_SITE_OTHER): Payer: Self-pay

## 2016-12-19 VITALS — BP 120/60 | HR 82 | Ht 65.0 in | Wt 139.0 lb

## 2016-12-19 DIAGNOSIS — Z86010 Personal history of colonic polyps: Secondary | ICD-10-CM | POA: Diagnosis not present

## 2016-12-19 DIAGNOSIS — K5909 Other constipation: Secondary | ICD-10-CM | POA: Diagnosis not present

## 2016-12-19 DIAGNOSIS — K219 Gastro-esophageal reflux disease without esophagitis: Secondary | ICD-10-CM

## 2016-12-19 NOTE — Progress Notes (Signed)
Patient ID: Selenne Coggin, female   DOB: [birth year], 83 y.o.   MRN: [id number] HPI: Katurah Karapetian is an 83 yo with a past medical history of GERD, chronic constipation, colon polyps, who is seen in consultation at the request of Dr. Earle Gell and Dr. Laurann Montana after Dr. Durenda Age retirement to establish care regarding her GERD and chronic constipation. She is here alone today.  She reports very stable symptoms of reflux and constipation. For reflux she is using Nexium 20 mg 3 times a day before meals. With this her heartburn is well controlled. She denies dysphagia and odynophagia. No nausea, vomiting, early satiety or change in appetite. She has remained very active and enjoys dancing. She did fall while dancing and may and is about to undergo lumbar steroid injections.  From a constipation perspective she uses MiraLAX on a daily basis and with this has a bowel movement every 1-2 days. She denies constipation, diarrhea, blood in her stool and melena. If she uses MiraLAX more than once a day she can have some fecal seepage and loose stools.  Her last upper endoscopy and colonoscopy were in 2012 performed by Dr. Wynetta Emery. I have reviewed these today. She had one 5 mm adenoma removed from the transverse colon in 2012. Upper endoscopy was unremarkable except for small fundic gland appearing polyps. There is no evidence of Barrett's esophagus  Past Medical History:  Diagnosis Date  . Adenomatous colon polyp   . Asthma   . CAD (coronary artery disease)    Chest CTA 10/07: 40% or less pLAD;  ETT 8/09: negative  . Carotid bruit   . Chronic back pain   . Depression   . Diverticulosis   . DJD (degenerative joint disease)   . Dyslipidemia   . Dysrhythmia    pvcs  . Fatty liver   . GERD (gastroesophageal reflux disease)   . HTN (hypertension)   . Hypothyroidism   . IBS (irritable bowel syndrome)   . Internal hemorrhoid   . Melanoma ([place])    metastatic; left leg; s/p multiple excisions  . Migraine   .  Osteoporosis   . Spinal stenosis     Past Surgical History:  Procedure Laterality Date  . BACK SURGERY    . BLEPHAROPLASTY    . BUNIONECTOMY WITH HAMMERTOE RECONSTRUCTION Right 01/16/2013   Procedure: RIGHT Barbie Banner OSTEOTOMY WITH SIMPLE BUNIONECTOMY AND SECOND HAMMER TOE CORRECTION;  Surgeon: Wylene Simmer, MD;  Location: [facility];  Service: Orthopedics;  Laterality: Right;  . CARPAL TUNNEL RELEASE    . KNEE ARTHROSCOPY Right   . LUMBAR LAMINECTOMY/DECOMPRESSION MICRODISCECTOMY N/A 11/13/2013   Procedure: LUMBAR TWO-THREE,LUMBAR THREE-FOUR,LUMBAR FOUR-FIVE LAMINECTOMY AND FORAMINOTOMY;  Surgeon: Ophelia Charter, MD;  Location: [facility];  Service: Neurosurgery;  Laterality: N/A;  . MELANOMA EXCISION    . MELANOMA EXCISION Left 09/19/2012   Procedure: WIDE EXCISION METASTATIC MELANOMA LEFT MEDIAL THIGH;  Surgeon: Zenovia Jarred, MD;  Location: [facility];  Service: General;  Laterality: Left;  . ROTATOR CUFF REPAIR    . SHOULDER SURGERY    . TOE SURGERY    . TOTAL HIP ARTHROPLASTY  09/04/2011   Procedure: TOTAL HIP ARTHROPLASTY;  Surgeon: Gearlean Alf, MD;  [facility];  Service: Orthopedics;  Laterality: Left;    Outpatient Medications Prior to Visit  Medication Sig Dispense Refill  . esomeprazole (NEXIUM) 20 MG capsule Take 20 mg by mouth daily at 12 noon.    . hydrochlorothiazide (MICROZIDE) 12.5 MG  capsule Take 12.5 mg by mouth daily after breakfast.     . hydroxypropyl methylcellulose (ISOPTO TEARS) 2.5 % ophthalmic solution Place 1 drop into both eyes 2 (two) times daily. Dry eyes    . levothyroxine (SYNTHROID, LEVOTHROID) 75 MCG tablet Take 75 mcg by mouth daily with breakfast.    . loratadine (CLARITIN) 10 MG tablet Take 10 mg by mouth daily.     . Magnesium 500 MG TABS Take 500 mg by mouth daily.    . naproxen sodium (ANAPROX) 220 MG tablet Take 220 mg by mouth 2 (two) times daily with a meal.    . OLUX-E 0.05 % topical foam Apply 1  application topically 2 (two) times daily as needed. To scalp for Itching    . polyethylene glycol powder (GLYCOLAX/MIRALAX) powder Take 17 g by mouth at bedtime.     Marland Kitchen RETIN-A 0.1 % cream Apply 1 application topically 2 (two) times a week.     . rosuvastatin (CRESTOR) 10 MG tablet Take 10 mg by mouth at bedtime.     Marland Kitchen VITAMIN D, CHOLECALCIFEROL, PO Take 1 tablet by mouth daily.    . clindamycin (CLEOCIN) 300 MG capsule Take 1 capsule (300 mg total) by mouth 3 (three) times daily. 21 capsule 0   No facility-administered medications prior to visit.     Allergies  Allergen Reactions  . Buprenorphine Hcl Itching    Happened a while back. Patient noted that any narcotic based medication makes her have a bad reaction.  . Codeine Nausea And Vomiting    Patient gets sick on stomach  . Morphine And Related Itching    Happened a while back. Patient noted that any narcotic based medication makes her have a bad reaction.  . Tramadol Nausea And Vomiting and Other (See Comments)    confusion  . Other Other (See Comments)  . Adhesive [Tape] Other (See Comments)    Tears skin   . Hydrocodone-Acetaminophen Nausea And Vomiting  . Lactose Intolerance (Gi) Other (See Comments)    Gi upset   . Oxycodone Nausea And Vomiting    Family History  Problem Relation Age of Onset  . CAD Mother   . CAD Father   . Kidney disease Child     Social History  Substance Use Topics  . Smoking status: Former Smoker    Types: Cigarettes    Quit date: 08/31/1948  . Smokeless tobacco: Never Used  . Alcohol use 0.0 oz/week     Comment: occasional drinker    ROS: As per history of present illness, otherwise negative  BP 120/60   Pulse 82   Ht 5\' 5"  (1.651 m)   Wt 139 lb (63 kg)   BMI 23.13 kg/m  Constitutional: Well-developed and well-nourished. No distress. HEENT: Normocephalic and atraumatic. Conjunctivae are normal.  No scleral icterus. Neck: Neck supple. Trachea midline. Cardiovascular: Normal  rate, regular rhythm and intact distal pulses. Pulmonary/chest: Effort normal and breath sounds normal. No wheezing, rales or rhonchi. Abdominal: Soft, nontender, nondistended. Bowel sounds active throughout.  Extremities: no clubbing, cyanosis, or edema Neurological: Alert and oriented to person place and time. Skin: Skin is warm and dry.  Psychiatric: Normal mood and affect. Behavior is normal.  ASSESSMENT/PLAN:  83 yo with a past medical history of GERD, chronic constipation, colon polyps, who is seen in consultation at the request of Dr. Earle Gell and Dr. Laurann Montana after Dr. Durenda Age retirement to establish care regarding her GERD and chronic constipation.  1. GERD --  stable symptoms on Nexium 20 mg 3 times a day before meals. She does not wish to decrease dosing. We reviewed chronic PPI therapy today. She will continue Nexium 20 g 3 times a day. There are no alarm symptoms to warrant endoscopy.  2. Chronic constipation -- doing well MiraLAX therapy. Continue MiraLAX 17 g daily  3. History of colon polyps -- last colonoscopy was at age 44 with one 5 mm adenoma removed. We discussed how colonoscopy for screening and surveillance typically stops around age 24. She wishes to defer further screening/surveillance which I am in agreement with.  She can follow-up on an as-needed basis with me. She was made aware that she is now establish with our practice and should not need a referral for future office visits     NM:[id number], John, Lena [address] [place] [address] [place], [place] [address]

## 2016-12-19 NOTE — Patient Instructions (Signed)
Continue Nexium and miralax.  Please follow up with Dr Hilarie Fredrickson as needed.  If you are age 81 or older, your body mass index should be between 23-30. Your Body mass index is 23.13 kg/m. If this is out of the aforementioned range listed, please consider follow up with your Primary Care Provider.  If you are age 65 or younger, your body mass index should be between 19-25. Your Body mass index is 23.13 kg/m. If this is out of the aformentioned range listed, please consider follow up with your Primary Care Provider.

## 2016-12-29 DIAGNOSIS — M170 Bilateral primary osteoarthritis of knee: Secondary | ICD-10-CM | POA: Diagnosis not present

## 2017-01-10 ENCOUNTER — Other Ambulatory Visit: Payer: Self-pay | Admitting: Geriatric Medicine

## 2017-01-10 DIAGNOSIS — R42 Dizziness and giddiness: Secondary | ICD-10-CM | POA: Diagnosis not present

## 2017-01-10 DIAGNOSIS — I10 Essential (primary) hypertension: Secondary | ICD-10-CM | POA: Diagnosis not present

## 2017-01-10 DIAGNOSIS — R2689 Other abnormalities of gait and mobility: Secondary | ICD-10-CM | POA: Diagnosis not present

## 2017-01-11 NOTE — Progress Notes (Signed)
HPI The patient presents for preoperative evaluation.  I have seen her in the past for palpitations and HTN.  She has had mild coronary disease.  She had an episode on Monday where she got up at night in her legs didn't work. She felt the same way Tuesday morning. She's had blood pressures that have been elevated at almost 180/85. It's been somewhat labile and fluctuating. She was to see Dr Felipa Eth he thought that she might of had a small stroke and she is scheduled to have an MRI. She denies any palpitations, presyncope or syncope. She's had no chest pressure, neck or arm discomfort.  She did fall while dancing not long ago. She's had some aches and pains and somewhat limited from this. She denies any new shortness of breath, PND or orthopnea. She's had some weight gain but no edema.    She cares for her husband who has early dementia.    Allergies  Allergen Reactions  . Buprenorphine Hcl Itching    Happened a while back. Patient noted that any narcotic based medication makes her have a bad reaction.  . Codeine Nausea And Vomiting    Patient gets sick on stomach  . Morphine And Related Itching    Happened a while back. Patient noted that any narcotic based medication makes her have a bad reaction.  . Tramadol Nausea And Vomiting and Other (See Comments)    confusion  . Adhesive [Tape] Other (See Comments)    Tears skin   . Other Other (See Comments)  . Hydrocodone-Acetaminophen Nausea And Vomiting  . Lactose Intolerance (Gi) Other (See Comments)    Gi upset   . Oxycodone Nausea And Vomiting    Current Outpatient Prescriptions  Medication Sig Dispense Refill  . AMOXICILLIN PO Take by mouth. Take 4 one hour prior to dental procedures due to her mitral valve prolapse    . esomeprazole (NEXIUM) 20 MG capsule Take 20 mg by mouth daily at 12 noon.    . hydrochlorothiazide (MICROZIDE) 12.5 MG capsule Take 12.5 mg by mouth daily after breakfast.     . hydroxypropyl methylcellulose  (ISOPTO TEARS) 2.5 % ophthalmic solution Place 1 drop into both eyes 2 (two) times daily. Dry eyes    . levothyroxine (SYNTHROID, LEVOTHROID) 75 MCG tablet Take 75 mcg by mouth daily with breakfast.    . loratadine (CLARITIN) 10 MG tablet Take 10 mg by mouth daily.     . Magnesium 500 MG TABS Take 500 mg by mouth daily.    . naproxen sodium (ANAPROX) 220 MG tablet Take 220 mg by mouth 2 (two) times daily with a meal.    . RETIN-A 0.1 % cream Apply 1 application topically 2 (two) times a week.     . rosuvastatin (CRESTOR) 10 MG tablet Take 10 mg by mouth at bedtime.     Marland Kitchen VITAMIN D, CHOLECALCIFEROL, PO Take 1 tablet by mouth daily.     No current facility-administered medications for this visit.     Past Medical History:  Diagnosis Date  . Adenomatous colon polyp   . Asthma   . CAD (coronary artery disease)    Chest CTA 10/07: 40% or less pLAD;  ETT 8/09: negative  . Carotid bruit   . Chronic back pain   . Depression   . Diverticulosis   . DJD (degenerative joint disease)   . Dyslipidemia   . Dysrhythmia    pvcs  . Fatty liver   . GERD (  gastroesophageal reflux disease)   . HTN (hypertension)   . Hypothyroidism   . IBS (irritable bowel syndrome)   . Internal hemorrhoid   . Melanoma ([place])    metastatic; left leg; s/p multiple excisions  . Migraine   . Osteoporosis   . Spinal stenosis     Past Surgical History:  Procedure Laterality Date  . BACK SURGERY    . BLEPHAROPLASTY    . BUNIONECTOMY WITH HAMMERTOE RECONSTRUCTION Right 01/16/2013   Procedure: RIGHT Barbie Banner OSTEOTOMY WITH SIMPLE BUNIONECTOMY AND SECOND HAMMER TOE CORRECTION;  Surgeon: Wylene Simmer, MD;  Location: [facility];  Service: Orthopedics;  Laterality: Right;  . CARPAL TUNNEL RELEASE    . KNEE ARTHROSCOPY Right   . LUMBAR LAMINECTOMY/DECOMPRESSION MICRODISCECTOMY N/A 11/13/2013   Procedure: LUMBAR TWO-THREE,LUMBAR THREE-FOUR,LUMBAR FOUR-FIVE LAMINECTOMY AND FORAMINOTOMY;  Surgeon: Ophelia Charter,  MD;  Location: [facility];  Service: Neurosurgery;  Laterality: N/A;  . MELANOMA EXCISION    . MELANOMA EXCISION Left 09/19/2012   Procedure: WIDE EXCISION METASTATIC MELANOMA LEFT MEDIAL THIGH;  Surgeon: Zenovia Jarred, MD;  Location: [facility];  Service: General;  Laterality: Left;  . ROTATOR CUFF REPAIR    . SHOULDER SURGERY    . TOE SURGERY    . TOTAL HIP ARTHROPLASTY  09/04/2011   Procedure: TOTAL HIP ARTHROPLASTY;  Surgeon: Gearlean Alf, MD;  [facility];  Service: Orthopedics;  Laterality: Left;    ROS:   As stated in the HPI and negative for all other systems.   PHYSICAL EXAM BP 130/82   Pulse 64   Ht 5\' 5"  (1.651 m)   Wt 138 lb (62.6 kg)   BMI 22.96 kg/m   GENERAL:  Well appearing and looks younger than her age NECK:  No jugular venous distention, waveform within normal limits, carotid upstroke brisk and symmetric, no bruits, no thyromegaly LUNGS:  Clear to auscultation bilaterally CHEST:  Unremarkable HEART:  PMI not displaced or sustained,S1 and S2 within normal limits, no S3, no S4, no clicks, no rubs, no murmurs ABD:  Flat, positive bowel sounds normal in frequency in pitch, no bruits, no rebound, no guarding, no midline pulsatile mass, no hepatomegaly, no splenomegaly EXT:  2 plus pulses throughout, no edema, no cyanosis no clubbing   EKG:  NSR, rate 64, axis WNL, intervals WNL, PVCs, , no acute ST T wave changes.  01/14/2017    ASSESSMENT AND PLAN   HYPERTENSION  I have given her instructions on how to keep a BP diary.  She will do this for two weeks and I will make further adjustments based on these readings.    PVCs She has not had any symptoms related to these.  No change in therapy.   CAD:   Mild in the past.  She will continue with risk reduction.

## 2017-01-12 ENCOUNTER — Encounter: Payer: Self-pay | Admitting: Cardiology

## 2017-01-12 ENCOUNTER — Ambulatory Visit (INDEPENDENT_AMBULATORY_CARE_PROVIDER_SITE_OTHER): Payer: Medicare Other | Admitting: Cardiology

## 2017-01-12 VITALS — BP 130/82 | HR 64 | Ht 65.0 in | Wt 138.0 lb

## 2017-01-12 DIAGNOSIS — Z01818 Encounter for other preprocedural examination: Secondary | ICD-10-CM

## 2017-01-12 DIAGNOSIS — I10 Essential (primary) hypertension: Secondary | ICD-10-CM

## 2017-01-12 NOTE — Patient Instructions (Addendum)
Medication Instructions:  Continue current medications  Labwork: None Ordered  Testing/Procedures: None Ordered  Follow-Up: Your physician recommends that you schedule a follow-up appointment in: 2 Weeks.   Any Other Special Instructions Will Be Listed Below (If Applicable).     If you need a refill on your cardiac medications before your next appointment, please call your pharmacy.   

## 2017-01-14 ENCOUNTER — Encounter: Payer: Self-pay | Admitting: Cardiology

## 2017-01-16 ENCOUNTER — Telehealth: Payer: Self-pay | Admitting: Cardiology

## 2017-01-16 NOTE — Addendum Note (Signed)
Addended by: Milderd Meager on: 01/16/2017 10:10 AM   Modules accepted: Orders

## 2017-01-16 NOTE — Telephone Encounter (Signed)
Agree 

## 2017-01-16 NOTE — Telephone Encounter (Signed)
Pt of Dr. Percival Spanish  She saw Dr. Percival Spanish in office last week, & has since been following her BPs at home. Patient had shared her BP readings w operator (see Constance Holster Miller's note).  She states concern today bc she has a headache which she had since getting up this morning, also states her BPs are running higher today & she is concerned about this.  Advised to treat headache, OTC tylenol should be OK (she is already on naproxen & gabapentin as scheduled medication for chronic pain, will verify OK pe pharmD), if this improves pain see if BPs also improve. Recommended keep f/u as scheduled.  Pt voiced understanding. She is aware I will call w any further recommendations from Dr. Percival Spanish either today or later this week.

## 2017-01-16 NOTE — Telephone Encounter (Signed)
Pt calling saying her Blood pressure now is 163/103 and pulse rate is 68,please call asap.-

## 2017-01-16 NOTE — Telephone Encounter (Signed)
New message   Cell [phone]  Pt c/o BP issue: STAT if pt c/o blurred vision, one-sided weakness or slurred speech  1. What are your last 5 BP readings? 120/67 79  fri  Sat 142/82 65 105/65 86 3p 124/74 78  Sunday 135/87 76  133/90 -74  126/74 79 Monday 136/77 69 120/60  98/58 82  tues  164/95 65    2. Are you having any other symptoms (ex. Dizziness, headache, blurred vision, passed out)? Little woozy in the the head , when getting up in the morning   3. What is your BP issue?   Letting you know her bp is fluctuating , is this normal ?

## 2017-01-18 NOTE — Telephone Encounter (Signed)
Spoke with Kerri [name] letting her know that Dr Percival Spanish agree with recommendation

## 2017-01-19 DIAGNOSIS — M5136 Other intervertebral disc degeneration, lumbar region: Secondary | ICD-10-CM | POA: Diagnosis not present

## 2017-01-23 ENCOUNTER — Ambulatory Visit
Admission: RE | Admit: 2017-01-23 | Discharge: 2017-01-23 | Disposition: A | Discharge status: Home / Self Care | Payer: Medicare Other | Source: Ambulatory Visit | Attending: Geriatric Medicine | Admitting: Geriatric Medicine

## 2017-01-23 DIAGNOSIS — R51 Headache: Secondary | ICD-10-CM | POA: Diagnosis not present

## 2017-01-23 DIAGNOSIS — R2689 Other abnormalities of gait and mobility: Secondary | ICD-10-CM

## 2017-01-28 NOTE — Progress Notes (Signed)
HPI The patient presents for preoperative evaluation.  I have seen her in the past for palpitations and HTN.  She has had mild coronary disease.  She presented recently and thought that she might have had a stroke or TIA but she had vague symptoms for which she did not receive any evaluation.  She was having some fluctuating BPs and she was told to keep a BP diary.  She did call with one episode of HTN.  This was 170/100.  She felt dizzy for a week.  However, I reviewed her BP diary and her BPs for the most part were well controlled.  It did fluctuate somewhat but this was relatively mild.  She has had no presyncope or syncope.  The patient denies any new symptoms such as chest discomfort, neck or arm discomfort. There has been no new shortness of breath, PND or orthopnea.   She has stress as she cares for her husband who has early dementia.    Allergies  Allergen Reactions  . Buprenorphine Hcl Itching    Happened a while back. Patient noted that any narcotic based medication makes her have a bad reaction.  . Codeine Nausea And Vomiting    Patient gets sick on stomach  . Morphine And Related Itching    Happened a while back. Patient noted that any narcotic based medication makes her have a bad reaction.  . Tramadol Nausea And Vomiting and Other (See Comments)    confusion  . Adhesive [Tape] Other (See Comments)    Tears skin   . Other Other (See Comments)  . Hydrocodone-Acetaminophen Nausea And Vomiting  . Lactose Intolerance (Gi) Other (See Comments)    Gi upset   . Oxycodone Nausea And Vomiting    Current Outpatient Prescriptions  Medication Sig Dispense Refill  . AMOXICILLIN PO Take by mouth. Take 4 one hour prior to dental procedures due to her mitral valve prolapse    . esomeprazole (NEXIUM) 20 MG capsule Take 20 mg by mouth daily at 12 noon.    . hydroxypropyl methylcellulose (ISOPTO TEARS) 2.5 % ophthalmic solution Place 1 drop into both eyes 2 (two) times daily. Dry eyes     . levothyroxine (SYNTHROID, LEVOTHROID) 75 MCG tablet Take 75 mcg by mouth daily with breakfast.    . loratadine (CLARITIN) 10 MG tablet Take 10 mg by mouth daily.     . Magnesium 500 MG TABS Take 500 mg by mouth daily.    . naproxen sodium (ANAPROX) 220 MG tablet Take 220 mg by mouth 2 (two) times daily with a meal.    . RETIN-A 0.1 % cream Apply 1 application topically 2 (two) times a week.     . rosuvastatin (CRESTOR) 10 MG tablet Take 10 mg by mouth at bedtime.     Marland Kitchen VITAMIN D, CHOLECALCIFEROL, PO Take 1 tablet by mouth daily.    . chlorthalidone (HYGROTON) 25 MG tablet Take 0.5 tablets (12.5 mg total) by mouth daily. 45 tablet 3  . hydrALAZINE (APRESOLINE) 10 MG tablet Take 1/2 tablet (5mg ) by mouth twice daily as needed for BP >170/100 30 tablet 1   No current facility-administered medications for this visit.     Past Medical History:  Diagnosis Date  . Adenomatous colon polyp   . Asthma   . CAD (coronary artery disease)    Chest CTA 10/07: 40% or less pLAD;  ETT 8/09: negative  . Carotid bruit   . Chronic back pain   .  Depression   . Diverticulosis   . DJD (degenerative joint disease)   . Dyslipidemia   . Dysrhythmia    pvcs  . Fatty liver   . GERD (gastroesophageal reflux disease)   . HTN (hypertension)   . Hypothyroidism   . IBS (irritable bowel syndrome)   . Internal hemorrhoid   . Melanoma ([place])    metastatic; left leg; s/p multiple excisions  . Migraine   . Osteoporosis   . Spinal stenosis     Past Surgical History:  Procedure Laterality Date  . BACK SURGERY    . BLEPHAROPLASTY    . BUNIONECTOMY WITH HAMMERTOE RECONSTRUCTION Right 01/16/2013   Procedure: RIGHT Barbie Banner OSTEOTOMY WITH SIMPLE BUNIONECTOMY AND SECOND HAMMER TOE CORRECTION;  Surgeon: Wylene Simmer, MD;  Location: [facility];  Service: Orthopedics;  Laterality: Right;  . CARPAL TUNNEL RELEASE    . KNEE ARTHROSCOPY Right   . LUMBAR LAMINECTOMY/DECOMPRESSION MICRODISCECTOMY N/A  11/13/2013   Procedure: LUMBAR TWO-THREE,LUMBAR THREE-FOUR,LUMBAR FOUR-FIVE LAMINECTOMY AND FORAMINOTOMY;  Surgeon: Ophelia Charter, MD;  Location: [facility];  Service: Neurosurgery;  Laterality: N/A;  . MELANOMA EXCISION    . MELANOMA EXCISION Left 09/19/2012   Procedure: WIDE EXCISION METASTATIC MELANOMA LEFT MEDIAL THIGH;  Surgeon: Zenovia Jarred, MD;  Location: [facility];  Service: General;  Laterality: Left;  . ROTATOR CUFF REPAIR    . SHOULDER SURGERY    . TOE SURGERY    . TOTAL HIP ARTHROPLASTY  09/04/2011   Procedure: TOTAL HIP ARTHROPLASTY;  Surgeon: Gearlean Alf, MD;  Location: [facility];  Service: Orthopedics;  Laterality: Left;    ROS:    As stated in the HPI and negative for all other systems.  PHYSICAL EXAM BP 140/80   Pulse 80   Ht 5\' 5"  (1.651 m)   Wt 138 lb (62.6 kg) Comment: patient reported; refused scale  BMI 22.96 kg/m   GENERAL:  Well appearing NECK:  No jugular venous distention, waveform within normal limits, carotid upstroke brisk and symmetric, no bruits, no thyromegaly LUNGS:  Clear to auscultation bilaterally CHEST:  Unremarkable HEART:  PMI not displaced or sustained,S1 and S2 within normal limits, no S3, no S4, no clicks, no rubs, no murmurs ABD:  Flat, positive bowel sounds normal in frequency in pitch, no bruits, no rebound, no guarding, no midline pulsatile mass, no hepatomegaly, no splenomegaly EXT:  2 plus pulses throughout, no edema, no cyanosis no clubbing  ASSESSMENT AND PLAN   HYPERTENSION  I discussed with her when necessary treatment of low blood pressures at which point she would lay down and have some increased salt. I discussed with her when necessary treatment of high blood pressures which I don't think she had to do often we could use a very low dose of hydralazine (5 mg). Otherwise the blood pressures fluctuate but are not extreme.  I also called and discussed this with her daughter who is a Marine scientist.  I will switch her  from HCTZ to chlorthalidone as it has a longer half-life and offers better coverage during the day.  PVCs She is not having symptoms clearly related to this.    CAD:   This has been mild.  No change in therapy.

## 2017-01-29 ENCOUNTER — Encounter: Payer: Self-pay | Admitting: Cardiology

## 2017-01-29 ENCOUNTER — Other Ambulatory Visit: Payer: Self-pay | Admitting: Cardiology

## 2017-01-29 ENCOUNTER — Ambulatory Visit (INDEPENDENT_AMBULATORY_CARE_PROVIDER_SITE_OTHER): Payer: Medicare Other | Admitting: Cardiology

## 2017-01-29 VITALS — BP 140/80 | HR 80 | Ht 65.0 in | Wt 138.0 lb

## 2017-01-29 DIAGNOSIS — M170 Bilateral primary osteoarthritis of knee: Secondary | ICD-10-CM | POA: Diagnosis not present

## 2017-01-29 DIAGNOSIS — I10 Essential (primary) hypertension: Secondary | ICD-10-CM

## 2017-01-29 DIAGNOSIS — R42 Dizziness and giddiness: Secondary | ICD-10-CM | POA: Diagnosis not present

## 2017-01-29 MED ORDER — CHLORTHALIDONE 25 MG PO TABS: 12.5000 mg | ORAL_TABLET | Freq: Every day | ORAL | 3 refills | Status: DC

## 2017-01-29 MED ORDER — CHLORTHALIDONE 25 MG PO TABS: 25.0000 mg | ORAL_TABLET | Freq: Every day | ORAL | 3 refills | Status: DC

## 2017-01-29 MED ORDER — HYDRALAZINE HCL 10 MG PO TABS: ORAL_TABLET | ORAL | 1 refills | Status: DC

## 2017-01-29 NOTE — Patient Instructions (Addendum)
Your physician has recommended you make the following change in your medication:  -- STOP hydrochlorothiazide -- START chlorthalidone 12.5mg  daily (1/2 of 25mg  tablet) -- TAKE hydralazine 5mg  (1/2 of 10mg  tablet) twice daily as needed      for systolic greater than 765 and/or diastolic greater than 465  Your physician wants you to follow-up in: TWO MONTHS

## 2017-01-30 ENCOUNTER — Encounter: Payer: Self-pay | Admitting: Cardiology

## 2017-01-30 DIAGNOSIS — R42 Dizziness and giddiness: Secondary | ICD-10-CM | POA: Insufficient documentation

## 2017-02-05 DIAGNOSIS — G894 Chronic pain syndrome: Secondary | ICD-10-CM | POA: Diagnosis not present

## 2017-02-05 DIAGNOSIS — M5136 Other intervertebral disc degeneration, lumbar region: Secondary | ICD-10-CM | POA: Diagnosis not present

## 2017-02-05 DIAGNOSIS — M961 Postlaminectomy syndrome, not elsewhere classified: Secondary | ICD-10-CM | POA: Diagnosis not present

## 2017-02-13 ENCOUNTER — Telehealth: Payer: Self-pay | Admitting: Cardiology

## 2017-02-13 NOTE — Telephone Encounter (Signed)
OK to increase chlorthalidone to 25 mg daily.

## 2017-02-13 NOTE — Telephone Encounter (Signed)
Pt said she saw Dr York Ram on 01-29-17.She thought he wanted to see her back in 2 weeks.When does she need her next appointment?

## 2017-02-13 NOTE — Telephone Encounter (Signed)
Returned call to patient she stated her B/P is still elevated.Stated since she started taking Chlorthalidone 25 mg 1/2 tablet twice a day B/P as follows 144/87,137/82,120/82,135/84,149/93,140/90,140/62,158/81,159/110,151/84,120/72,130/93,147/82,144/92.Pulse ranging 66 to 87. Stated Chlorthalidone very hard to cut in half.She wanted to know if she can take 25 mg daily.Stated she feels ok, just upset her B/P is still elevated.Stated she wants to see Dr.Hochrein sooner than November.Appointment moved to 03/16/17 at 3:20 pm.Advised I will send message to Dr.Hochrein for advice.

## 2017-02-14 MED ORDER — CHLORTHALIDONE 25 MG PO TABS: 25.0000 mg | ORAL_TABLET | Freq: Every day | ORAL | 6 refills | Status: DC

## 2017-02-14 NOTE — Telephone Encounter (Signed)
Returned call to patient Dr.Hochrein recommendations given.Advised to continue to monitor B/P and call back if continues to be elevated.

## 2017-02-15 ENCOUNTER — Telehealth: Payer: Self-pay | Admitting: Cardiology

## 2017-02-15 NOTE — Telephone Encounter (Signed)
Pt states that her BP is high again today she states that she was told to increase hydralazine to 10mg  (a full pill). Pt states that she has not started the chlorthalidone 12.5mg  (1/2 pill) daily. Pt would like to know what medication she should take now, she has taken hydralazine 10mg  last night and 10mg  this morning and her BP is still high 163/84-70   146/87-73     168/100-71 she is having headaches. She states that she is leaving in an hour and would like to be called ASAP.

## 2017-02-15 NOTE — Telephone Encounter (Signed)
I am not sure why she did not start the chlorthalidone.  If she is not taking that she should start that as prescribed.

## 2017-02-15 NOTE — Telephone Encounter (Signed)
Pt c/o medication issue:  1. Name of Medication: pt dont know  2. How are you currently taking this medication (dosage and times per day)? She said she is not going to walk back to the next room to get the bottle  3. Are you having a reaction (difficulty breathing--STAT)? no 4. What is your medication issue? 163/84-70   032/12-24     168/100-71  Headaches

## 2017-02-16 NOTE — Telephone Encounter (Signed)
Pugh, Lucianne Muss D, LPN 2 days ago      Returned call to patient Dr.Hochrein recommendations given.Advised to continue to monitor B/P and call back if continues to be elevated.

## 2017-02-21 DIAGNOSIS — M170 Bilateral primary osteoarthritis of knee: Secondary | ICD-10-CM | POA: Diagnosis not present

## 2017-02-23 DIAGNOSIS — M5416 Radiculopathy, lumbar region: Secondary | ICD-10-CM | POA: Diagnosis not present

## 2017-02-28 ENCOUNTER — Telehealth: Payer: Self-pay | Admitting: Cardiology

## 2017-02-28 NOTE — Telephone Encounter (Signed)
New message    Pt c/o BP issue: STAT if pt c/o blurred vision, one-sided weakness or slurred speech  1. What are your last 5 BP readings? 149/85 173/92 143/88 144/86 142/88 150/91   2. Are you having any other symptoms (ex. Dizziness, headache, blurred vision, passed out)? Headache, dizziness  3. What is your BP issue? Pt states her BP is high and she is going out of town on Saturday and she is very concerned about it. She is asking for a call back today.

## 2017-02-28 NOTE — Telephone Encounter (Signed)
Prefer to try to address in the office.  Schedule follow up for Monday.

## 2017-02-28 NOTE — Telephone Encounter (Signed)
Spoke to patient. Unable to come 10 /15 , reschedule for 10 /17 /18

## 2017-02-28 NOTE — Telephone Encounter (Signed)
Left message to call back -- appointment schedule for 04/05/17 9:40 am

## 2017-02-28 NOTE — Telephone Encounter (Signed)
Spoke to patient . She does not think medication is helping.  She is taking the  medication --chlorthalidone  1/2 tablet of 25 mg twice a day.  she states she dizzy  With a frontal headache.  aware will defer to Dr Charlesetta Shanks AND CALL BACK

## 2017-03-05 ENCOUNTER — Ambulatory Visit: Payer: Medicare Other | Admitting: Cardiology

## 2017-03-06 NOTE — Progress Notes (Deleted)
HPI The patient presents for preoperative evaluation.  I have seen her in the past for palpitations and HTN.  She has had mild coronary disease.  She presented recently and thought that she might have had a stroke or TIA but she had vague symptoms for which she did not receive any evaluation.  She was having some fluctuating BPs and she was told to keep a BP diary.  We have adjusted her meds and she has called many times with her BP readings.  We scheduled her for follow up.  ***    She did call with one episode of HTN.  This was 170/100.  She felt dizzy for a week.  However, I reviewed her BP diary and her BPs for the most part were well controlled.  It did fluctuate somewhat but this was relatively mild.  She has had no presyncope or syncope.  The patient denies any new symptoms such as chest discomfort, neck or arm discomfort. There has been no new shortness of breath, PND or orthopnea.   She has stress as she cares for her husband who has early dementia.    Allergies  Allergen Reactions  . Buprenorphine Hcl Itching    Happened a while back. Patient noted that any narcotic based medication makes her have a bad reaction.  . Codeine Nausea And Vomiting    Patient gets sick on stomach  . Morphine And Related Itching    Happened a while back. Patient noted that any narcotic based medication makes her have a bad reaction.  . Tramadol Nausea And Vomiting and Other (See Comments)    confusion  . Adhesive [Tape] Other (See Comments)    Tears skin   . Other Other (See Comments)  . Hydrocodone-Acetaminophen Nausea And Vomiting  . Lactose Intolerance (Gi) Other (See Comments)    Gi upset   . Oxycodone Nausea And Vomiting    Current Outpatient Prescriptions  Medication Sig Dispense Refill  . AMOXICILLIN PO Take by mouth. Take 4 one hour prior to dental procedures due to her mitral valve prolapse    . chlorthalidone (HYGROTON) 25 MG tablet Take 1 tablet (25 mg total) by mouth daily. 30  tablet 6  . esomeprazole (NEXIUM) 20 MG capsule Take 20 mg by mouth daily at 12 noon.    . hydrALAZINE (APRESOLINE) 10 MG tablet Take 1/2 tablet (5mg ) by mouth twice daily as needed for BP >170/100 30 tablet 1  . hydroxypropyl methylcellulose (ISOPTO TEARS) 2.5 % ophthalmic solution Place 1 drop into both eyes 2 (two) times daily. Dry eyes    . levothyroxine (SYNTHROID, LEVOTHROID) 75 MCG tablet Take 75 mcg by mouth daily with breakfast.    . loratadine (CLARITIN) 10 MG tablet Take 10 mg by mouth daily.     . Magnesium 500 MG TABS Take 500 mg by mouth daily.    . naproxen sodium (ANAPROX) 220 MG tablet Take 220 mg by mouth 2 (two) times daily with a meal.    . RETIN-A 0.1 % cream Apply 1 application topically 2 (two) times a week.     . rosuvastatin (CRESTOR) 10 MG tablet Take 10 mg by mouth at bedtime.     Marland Kitchen VITAMIN D, CHOLECALCIFEROL, PO Take 1 tablet by mouth daily.     No current facility-administered medications for this visit.     Past Medical History:  Diagnosis Date  . Adenomatous colon polyp   . Asthma   . CAD (coronary artery disease)  Chest CTA 10/07: 40% or less pLAD;  ETT 8/09: negative  . Carotid bruit   . Chronic back pain   . Depression   . Diverticulosis   . DJD (degenerative joint disease)   . Dyslipidemia   . Dysrhythmia    pvcs  . Fatty liver   . GERD (gastroesophageal reflux disease)   . HTN (hypertension)   . Hypothyroidism   . IBS (irritable bowel syndrome)   . Internal hemorrhoid   . Melanoma ([place])    metastatic; left leg; s/p multiple excisions  . Migraine   . Osteoporosis   . Spinal stenosis     Past Surgical History:  Procedure Laterality Date  . BACK SURGERY    . BLEPHAROPLASTY    . BUNIONECTOMY WITH HAMMERTOE RECONSTRUCTION Right 01/16/2013   Procedure: RIGHT Barbie Banner OSTEOTOMY WITH SIMPLE BUNIONECTOMY AND SECOND HAMMER TOE CORRECTION;  Surgeon: Wylene Simmer, MD;  Location: [facility];  Service: Orthopedics;  Laterality: Right;    . CARPAL TUNNEL RELEASE    . KNEE ARTHROSCOPY Right   . LUMBAR LAMINECTOMY/DECOMPRESSION MICRODISCECTOMY N/A 11/13/2013   Procedure: LUMBAR TWO-THREE,LUMBAR THREE-FOUR,LUMBAR FOUR-FIVE LAMINECTOMY AND FORAMINOTOMY;  Surgeon: Ophelia Charter, MD;  Location: [facility];  Service: Neurosurgery;  Laterality: N/A;  . MELANOMA EXCISION    . MELANOMA EXCISION Left 09/19/2012   Procedure: WIDE EXCISION METASTATIC MELANOMA LEFT MEDIAL THIGH;  Surgeon: Zenovia Jarred, MD;  Location: [facility];  Service: General;  Laterality: Left;  . ROTATOR CUFF REPAIR    . SHOULDER SURGERY    . TOE SURGERY    . TOTAL HIP ARTHROPLASTY  09/04/2011   Procedure: TOTAL HIP ARTHROPLASTY;  Surgeon: Gearlean Alf, MD;  Location: [facility];  Service: Orthopedics;  Laterality: Left;    ROS:    ***  PHYSICAL EXAM There were no vitals taken for this visit.  GENERAL:  Well appearing NECK:  No jugular venous distention, waveform within normal limits, carotid upstroke brisk and symmetric, no bruits, no thyromegaly LUNGS:  Clear to auscultation bilaterally CHEST:  Unremarkable HEART:  PMI not displaced or sustained,S1 and S2 within normal limits, no S3, no S4, no clicks, no rubs, *** murmurs ABD:  Flat, positive bowel sounds normal in frequency in pitch, no bruits, no rebound, no guarding, no midline pulsatile mass, no hepatomegaly, no splenomegaly EXT:  2 plus pulses throughout, no edema, no cyanosis no clubbing   GENERAL:  Well appearing NECK:  No jugular venous distention, waveform within normal limits, carotid upstroke brisk and symmetric, no bruits, no thyromegaly LUNGS:  Clear to auscultation bilaterally CHEST:  Unremarkable HEART:  PMI not displaced or sustained,S1 and S2 within normal limits, no S3, no S4, no clicks, no rubs, no murmurs ABD:  Flat, positive bowel sounds normal in frequency in pitch, no bruits, no rebound, no guarding, no midline pulsatile mass, no hepatomegaly, no  splenomegaly EXT:  2 plus pulses throughout, no edema, no cyanosis no clubbing  ASSESSMENT AND PLAN   HYPERTENSION  ***  I discussed with her when necessary treatment of low blood pressures at which point she would lay down and have some increased salt. I discussed with her when necessary treatment of high blood pressures which I don't think she had to do often we could use a very low dose of hydralazine (5 mg). Otherwise the blood pressures fluctuate but are not extreme.  I also called and discussed this with her daughter who is a Marine scientist.  I will switch  her from HCTZ to chlorthalidone as it has a longer half-life and offers better coverage during the day.  PVCs ***  She is not having symptoms clearly related to this.    CAD:   ***  This has been mild.  No change in therapy.

## 2017-03-07 ENCOUNTER — Ambulatory Visit: Payer: Medicare Other | Admitting: Cardiology

## 2017-03-12 DIAGNOSIS — G894 Chronic pain syndrome: Secondary | ICD-10-CM | POA: Diagnosis not present

## 2017-03-12 DIAGNOSIS — M961 Postlaminectomy syndrome, not elsewhere classified: Secondary | ICD-10-CM | POA: Diagnosis not present

## 2017-03-12 DIAGNOSIS — M5136 Other intervertebral disc degeneration, lumbar region: Secondary | ICD-10-CM | POA: Diagnosis not present

## 2017-03-13 ENCOUNTER — Other Ambulatory Visit: Payer: Self-pay | Admitting: Physician Assistant

## 2017-03-13 DIAGNOSIS — L821 Other seborrheic keratosis: Secondary | ICD-10-CM | POA: Diagnosis not present

## 2017-03-13 DIAGNOSIS — L570 Actinic keratosis: Secondary | ICD-10-CM | POA: Diagnosis not present

## 2017-03-13 DIAGNOSIS — D492 Neoplasm of unspecified behavior of bone, soft tissue, and skin: Secondary | ICD-10-CM | POA: Diagnosis not present

## 2017-03-13 DIAGNOSIS — C44319 Basal cell carcinoma of skin of other parts of face: Secondary | ICD-10-CM | POA: Diagnosis not present

## 2017-03-13 DIAGNOSIS — C4491 Basal cell carcinoma of skin, unspecified: Secondary | ICD-10-CM

## 2017-03-13 DIAGNOSIS — D229 Melanocytic nevi, unspecified: Secondary | ICD-10-CM | POA: Diagnosis not present

## 2017-03-13 HISTORY — DX: Basal cell carcinoma of skin, unspecified: C44.91

## 2017-03-15 NOTE — Progress Notes (Signed)
HPI The patient presents for preoperative evaluation.  I have seen her in the past for palpitations and HTN.  She has had mild coronary disease.  She presented recently and thought that she might have had a stroke or TIA but she had vague symptoms for which she did not receive any evaluation.  She was having some fluctuating BPs and she was told to keep a BP diary.  We have adjusted her meds and she has called many times with her BP readings.  We scheduled her for follow up.    She reports that her blood pressures are not well controlled.  I reviewed the diarrhea and I thought they were fairly reasonable but she is not happy with them.  She cannot cut the hydralazine in half.  If she is not having any new shortness of breath, PND orthopnea.  She is not having any palpitations, presyncope or syncope.  She has had no new chest discomfort.  Allergies  Allergen Reactions  . Buprenorphine Hcl Itching    Happened a while back. Patient noted that any narcotic based medication makes her have a bad reaction.  . Codeine Nausea And Vomiting    Patient gets sick on stomach  . Morphine And Related Itching    Happened a while back. Patient noted that any narcotic based medication makes her have a bad reaction.  . Tramadol Nausea And Vomiting and Other (See Comments)    confusion  . Adhesive [Tape] Other (See Comments)    Tears skin   . Other Other (See Comments)  . Hydrocodone-Acetaminophen Nausea And Vomiting  . Lactose Intolerance (Gi) Other (See Comments)    Gi upset   . Oxycodone Nausea And Vomiting    Current Outpatient Prescriptions  Medication Sig Dispense Refill  . AMOXICILLIN PO Take by mouth. Take 4 one hour prior to dental procedures due to her mitral valve prolapse    . chlorthalidone (HYGROTON) 25 MG tablet Take 1 tablet (25 mg total) by mouth daily. 30 tablet 6  . esomeprazole (NEXIUM) 20 MG capsule Take 20 mg by mouth daily at 12 noon.    . hydrALAZINE (APRESOLINE) 10 MG  tablet Take 1 tablet (10 mg total) by mouth daily. 90 tablet 3  . hydroxypropyl methylcellulose (ISOPTO TEARS) 2.5 % ophthalmic solution Place 1 drop into both eyes 2 (two) times daily. Dry eyes    . levothyroxine (SYNTHROID, LEVOTHROID) 75 MCG tablet Take 75 mcg by mouth daily with breakfast.    . loratadine (CLARITIN) 10 MG tablet Take 10 mg by mouth daily.     . Magnesium 500 MG TABS Take 500 mg by mouth daily.    . naproxen sodium (ANAPROX) 220 MG tablet Take 220 mg by mouth 2 (two) times daily with a meal.    . RETIN-A 0.1 % cream Apply 1 application topically 2 (two) times a week.     . rosuvastatin (CRESTOR) 10 MG tablet Take 10 mg by mouth at bedtime.     Marland Kitchen VITAMIN D, CHOLECALCIFEROL, PO Take 1 tablet by mouth daily.     No current facility-administered medications for this visit.     Past Medical History:  Diagnosis Date  . Adenomatous colon polyp   . Asthma   . CAD (coronary artery disease)    Chest CTA 10/07: 40% or less pLAD;  ETT 8/09: negative  . Carotid bruit   . Chronic back pain   . Depression   . Diverticulosis   .  DJD (degenerative joint disease)   . Dyslipidemia   . Dysrhythmia    pvcs  . Fatty liver   . GERD (gastroesophageal reflux disease)   . HTN (hypertension)   . Hypothyroidism   . IBS (irritable bowel syndrome)   . Internal hemorrhoid   . Melanoma ([place])    metastatic; left leg; s/p multiple excisions  . Migraine   . Osteoporosis   . Spinal stenosis     Past Surgical History:  Procedure Laterality Date  . BACK SURGERY    . BLEPHAROPLASTY    . BUNIONECTOMY WITH HAMMERTOE RECONSTRUCTION Right 01/16/2013   Procedure: RIGHT Barbie Banner OSTEOTOMY WITH SIMPLE BUNIONECTOMY AND SECOND HAMMER TOE CORRECTION;  Surgeon: Wylene Simmer, MD;  Location: [facility];  Service: Orthopedics;  Laterality: Right;  . CARPAL TUNNEL RELEASE    . KNEE ARTHROSCOPY Right   . LUMBAR LAMINECTOMY/DECOMPRESSION MICRODISCECTOMY N/A 11/13/2013   Procedure: LUMBAR  TWO-THREE,LUMBAR THREE-FOUR,LUMBAR FOUR-FIVE LAMINECTOMY AND FORAMINOTOMY;  Surgeon: Ophelia Charter, MD;  Location: [facility];  Service: Neurosurgery;  Laterality: N/A;  . MELANOMA EXCISION    . MELANOMA EXCISION Left 09/19/2012   Procedure: WIDE EXCISION METASTATIC MELANOMA LEFT MEDIAL THIGH;  Surgeon: Zenovia Jarred, MD;  Location: [facility];  Service: General;  Laterality: Left;  . ROTATOR CUFF REPAIR    . SHOULDER SURGERY    . TOE SURGERY    . TOTAL HIP ARTHROPLASTY  09/04/2011   Procedure: TOTAL HIP ARTHROPLASTY;  Surgeon: Gearlean Alf, MD;  Location[facility];  Service: Orthopedics;  Laterality: Left;    ROS:    As stated in the HPI and negative for all other systems.  PHYSICAL EXAM BP (!) 146/77   Pulse 71   Ht 5\' 5"  (1.651 m)   Wt 138 lb (62.6 kg)   BMI 22.96 kg/m   GENERAL:  Well appearing NECK:  No jugular venous distention, waveform within normal limits, carotid upstroke brisk and symmetric, no bruits, no thyromegaly LUNGS:  Clear to auscultation bilaterally CHEST:  Unremarkable HEART:  PMI not displaced or sustained,S1 and S2 within normal limits, no S3, no S4, no clicks, no rubs, no murmurs ABD:  Flat, positive bowel sounds normal in frequency in pitch, no bruits, no rebound, no guarding, no midline pulsatile mass, no hepatomegaly, no splenomegaly EXT:  2 plus pulses throughout, no edema, no cyanosis no clubbing   ASSESSMENT AND PLAN   HYPERTENSION  We had lots of back and forth about her blood pressure.  At this point we have settled on her taking hydralazine 10 mg daily.  She will remain on the chlorthalidone.  No other change in therapy.  PVCs She is not have any symptoms related to this.  No change in therapy.this.    CAD:   This was mild.  She is not having any chest pain.  No further testing is planned.

## 2017-03-16 ENCOUNTER — Encounter: Payer: Self-pay | Admitting: Cardiology

## 2017-03-16 ENCOUNTER — Ambulatory Visit (INDEPENDENT_AMBULATORY_CARE_PROVIDER_SITE_OTHER): Payer: Medicare Other | Admitting: Cardiology

## 2017-03-16 VITALS — BP 146/77 | HR 71 | Ht 65.0 in | Wt 138.0 lb

## 2017-03-16 DIAGNOSIS — I493 Ventricular premature depolarization: Secondary | ICD-10-CM | POA: Diagnosis not present

## 2017-03-16 DIAGNOSIS — I2510 Atherosclerotic heart disease of native coronary artery without angina pectoris: Secondary | ICD-10-CM | POA: Diagnosis not present

## 2017-03-16 MED ORDER — HYDRALAZINE HCL 10 MG PO TABS: 10.0000 mg | ORAL_TABLET | Freq: Every day | ORAL | 3 refills | Status: DC

## 2017-03-16 NOTE — Patient Instructions (Addendum)
Medication Instructions:  TAKE- Hydralazine 10 mg daily  If you need a refill on your cardiac medications before your next appointment, please call your pharmacy.  Labwork: None Ordered   Testing/Procedures: None Ordered   Follow-Up: Your physician wants you to follow-up in: 1 Months.    Thank you for choosing [facility]!!

## 2017-03-20 ENCOUNTER — Telehealth: Payer: Self-pay | Admitting: Cardiology

## 2017-03-20 NOTE — Telephone Encounter (Signed)
That is exactly what we settled on in the office.  She has had consistently elevated BPs.  She is not happy with these.  She could not cut the hydralazine in half by her report.    The next step in this long difficult process was to try a whole pill just once daily to see if this helped to even out her BPs.  She has been very sensitive to meds.  She has had great difficulty taking medications and is often angry about her BPs and confused about the meds..  I try to be as thorough and clear as possible.

## 2017-03-20 NOTE — Telephone Encounter (Signed)
Call with Dr Percival Spanish recommendation, pt is screaming stated she is not going to take 10 mg of hydralazione daily and she need to speak with Dr Percival Spanish personally, told her he is out of office this week, and I will have her come in to speak with one of his APP, appt made for 03/21/17 with [name].

## 2017-03-20 NOTE — Telephone Encounter (Signed)
Pt is calling to confirm this medication HYGRALAZINE 100mg  27M daily.  Per pt this med was stopped  Please give pt a call back to confirm.  Please call back by 9:45a if possible she will be leaving house around then.

## 2017-03-20 NOTE — Telephone Encounter (Signed)
Returned call to patient-patient states she is very confused.   States she told Dr. Percival Spanish she did not want to cut the chlorthalidone in half anymore because she is unable to.    States she informed Dr. Percival Spanish that she wanted something she can take a full tablet.   States she got her new prescription and it was hydralazine 10 mg daily-reports in the past she only takes this in emergencies.     Advised that per last OV note-Dr. Percival Spanish was adding hydralazine 10 mg daily in addition to her chlorthalidone which is listed as 25mg  daily.  Attempt to explain to patient multiple times, patient yelling and become irate stating this is not correct.     OV note 10/26: HYPERTENSION  We had lots of back and forth about her blood pressure.  At this point we have settled on her taking hydralazine 10 mg daily.  She will remain on the chlorthalidone.  No other change in therapy.   Patient continues to be upset after attempting to explain multiple times-advised I would route message to Dr. Percival Spanish and primary nurse for review.

## 2017-03-21 ENCOUNTER — Ambulatory Visit (INDEPENDENT_AMBULATORY_CARE_PROVIDER_SITE_OTHER): Payer: Medicare Other | Admitting: Cardiology

## 2017-03-21 ENCOUNTER — Encounter: Payer: Self-pay | Admitting: Cardiology

## 2017-03-21 DIAGNOSIS — I10 Essential (primary) hypertension: Secondary | ICD-10-CM

## 2017-03-21 NOTE — Assessment & Plan Note (Signed)
We settled on chlorthalidone 25 mg daily- f/u with Dr Percival Spanish has been scheduled.

## 2017-03-21 NOTE — Patient Instructions (Signed)
Medication Instructions:  STOP- Hydralazine TAKE- Chlorthalidone 25 mg daily  If you need a refill on your cardiac medications before your next appointment, please call your pharmacy.  Labwork: None Ordered   Testing/Procedures: None Ordered  Follow-Up: Your physician wants you to follow-up in: Keep appointment with Dr Percival Spanish in November.    Thank you for choosing [facility] at [facility]!!

## 2017-03-21 NOTE — Progress Notes (Signed)
03/21/2017 Kerri [name]   Jun 05, 1929  [id number]  Primary Physician Lavone Orn, MD Primary Cardiologist: Dr Percival Spanish  HPI:  83 y/o female followed by Dr Percival Spanish with a history of HTN. She is in the office today because there was some confusion about her medications. We settled on her taking chlorthalidone 25 mg daily (no hydralazine).    Current Outpatient Prescriptions  Medication Sig Dispense Refill  . AMOXICILLIN PO Take by mouth. Take 4 one hour prior to dental procedures due to her mitral valve prolapse    . chlorthalidone (HYGROTON) 25 MG tablet Take 1 tablet (25 mg total) by mouth daily. 30 tablet 6  . esomeprazole (NEXIUM) 20 MG capsule Take 20 mg by mouth daily at 12 noon.    . hydroxypropyl methylcellulose (ISOPTO TEARS) 2.5 % ophthalmic solution Place 1 drop into both eyes 2 (two) times daily. Dry eyes    . levothyroxine (SYNTHROID, LEVOTHROID) 75 MCG tablet Take 75 mcg by mouth daily with breakfast.    . loratadine (CLARITIN) 10 MG tablet Take 10 mg by mouth daily.     . Magnesium 500 MG TABS Take 500 mg by mouth daily.    . naproxen sodium (ANAPROX) 220 MG tablet Take 220 mg by mouth 2 (two) times daily with a meal.    . RETIN-A 0.1 % cream Apply 1 application topically 2 (two) times a week.     . rosuvastatin (CRESTOR) 10 MG tablet Take 10 mg by mouth at bedtime.     Marland Kitchen VITAMIN D, CHOLECALCIFEROL, PO Take 1 tablet by mouth daily.     No current facility-administered medications for this visit.     Allergies  Allergen Reactions  . Buprenorphine Hcl Itching    Happened a while back. Patient noted that any narcotic based medication makes her have a bad reaction.  . Codeine Nausea And Vomiting    Patient gets sick on stomach  . Morphine And Related Itching    Happened a while back. Patient noted that any narcotic based medication makes her have a bad reaction.  . Tramadol Nausea And Vomiting and Other (See Comments)    confusion  . Adhesive [Tape] Other (See  Comments)    Tears skin   . Other Other (See Comments)  . Hydrocodone-Acetaminophen Nausea And Vomiting  . Lactose Intolerance (Gi) Other (See Comments)    Gi upset   . Oxycodone Nausea And Vomiting    Past Medical History:  Diagnosis Date  . Adenomatous colon polyp   . Asthma   . CAD (coronary artery disease)    Chest CTA 10/07: 40% or less pLAD;  ETT 8/09: negative  . Carotid bruit   . Chronic back pain   . Depression   . Diverticulosis   . DJD (degenerative joint disease)   . Dyslipidemia   . Dysrhythmia    pvcs  . Fatty liver   . GERD (gastroesophageal reflux disease)   . HTN (hypertension)   . Hypothyroidism   . IBS (irritable bowel syndrome)   . Internal hemorrhoid   . Melanoma ([place])    metastatic; left leg; s/p multiple excisions  . Migraine   . Osteoporosis   . Spinal stenosis     Social History   Social History  . Marital status: Married    Spouse name: N/A  . Number of children: 4  . Years of education: N/A   Occupational History  . retired    Social History Main Topics  . Smoking  status: Former Smoker    Types: Cigarettes    Quit date: 08/31/1948  . Smokeless tobacco: Never Used  . Alcohol use 0.0 oz/week     Comment: occasional drinker  . Drug use: No  . Sexual activity: No   Other Topics Concern  . Not on file   Social History Narrative  . No narrative on file     Family History  Problem Relation Age of Onset  . CAD Mother   . CAD Father   . Kidney disease Child      Review of Systems: General: negative for chills, fever, night sweats or weight changes.  Cardiovascular: negative for chest pain, dyspnea on exertion, edema, orthopnea, palpitations, paroxysmal nocturnal dyspnea or shortness of breath Dermatological: negative for rash Respiratory: negative for cough or wheezing Urologic: negative for hematuria Abdominal: negative for nausea, vomiting, diarrhea, bright red blood per rectum, melena, or hematemesis Neurologic:  negative for visual changes, syncope, or dizziness All other systems reviewed and are otherwise negative except as noted above.    Blood pressure 128/88, pulse 73, resp. rate 16, height 5\' 5"  (1.651 m), weight 138 lb (62.6 kg), SpO2 98 %.  General appearance: alert, cooperative and no distress Neck: no JVD Skin: Skin color, texture, turgor normal. No rashes or lesions Neurologic: Grossly normal   ASSESSMENT AND PLAN:   Essential hypertension We settled on chlorthalidone 25 mg daily- f/u with Dr Percival Spanish has been scheduled.   PLAN  As above  [name] PA-C 03/21/2017 10:17 AM

## 2017-03-22 DIAGNOSIS — H01005 Unspecified blepharitis left lower eyelid: Secondary | ICD-10-CM | POA: Diagnosis not present

## 2017-03-22 DIAGNOSIS — H01002 Unspecified blepharitis right lower eyelid: Secondary | ICD-10-CM | POA: Diagnosis not present

## 2017-03-22 DIAGNOSIS — H01001 Unspecified blepharitis right upper eyelid: Secondary | ICD-10-CM | POA: Diagnosis not present

## 2017-03-22 DIAGNOSIS — H01004 Unspecified blepharitis left upper eyelid: Secondary | ICD-10-CM | POA: Diagnosis not present

## 2017-03-26 DIAGNOSIS — E782 Mixed hyperlipidemia: Secondary | ICD-10-CM | POA: Diagnosis not present

## 2017-03-26 DIAGNOSIS — I10 Essential (primary) hypertension: Secondary | ICD-10-CM | POA: Diagnosis not present

## 2017-03-26 DIAGNOSIS — E039 Hypothyroidism, unspecified: Secondary | ICD-10-CM | POA: Diagnosis not present

## 2017-03-26 DIAGNOSIS — K219 Gastro-esophageal reflux disease without esophagitis: Secondary | ICD-10-CM | POA: Diagnosis not present

## 2017-03-27 ENCOUNTER — Ambulatory Visit: Payer: Medicare Other | Admitting: Cardiology

## 2017-03-29 DIAGNOSIS — C44319 Basal cell carcinoma of skin of other parts of face: Secondary | ICD-10-CM | POA: Diagnosis not present

## 2017-04-03 DIAGNOSIS — E876 Hypokalemia: Secondary | ICD-10-CM | POA: Diagnosis not present

## 2017-04-06 DIAGNOSIS — M5127 Other intervertebral disc displacement, lumbosacral region: Secondary | ICD-10-CM | POA: Diagnosis not present

## 2017-04-06 DIAGNOSIS — M5416 Radiculopathy, lumbar region: Secondary | ICD-10-CM | POA: Diagnosis not present

## 2017-04-06 DIAGNOSIS — M961 Postlaminectomy syndrome, not elsewhere classified: Secondary | ICD-10-CM | POA: Diagnosis not present

## 2017-04-17 ENCOUNTER — Encounter: Payer: Self-pay | Admitting: Cardiology

## 2017-04-17 NOTE — Progress Notes (Signed)
HPI The patient presents for evaluation of HTN.  Since I last saw her she has done well.  She went to [facility] for Thanksgiving and she had a good time.  She is not taking the BP as frequently as she was.  Her readings are 130s to 045W with diastolics in the 80 and 09W.  The patient denies any new symptoms such as chest discomfort, neck or arm discomfort. There has been no new shortness of breath, PND or orthopnea. There have been no reported palpitations, presyncope or syncope.  Allergies  Allergen Reactions  . Buprenorphine Hcl Itching    Happened a while back. Patient noted that any narcotic based medication makes her have a bad reaction.  . Codeine Nausea And Vomiting    Patient gets sick on stomach  . Morphine And Related Itching    Happened a while back. Patient noted that any narcotic based medication makes her have a bad reaction.  . Tramadol Nausea And Vomiting and Other (See Comments)    confusion  . Adhesive [Tape] Other (See Comments)    Tears skin   . Other Other (See Comments)  . Hydrocodone-Acetaminophen Nausea And Vomiting  . Lactose Intolerance (Gi) Other (See Comments)    Gi upset   . Oxycodone Nausea And Vomiting    Current Outpatient Medications  Medication Sig Dispense Refill  . AMOXICILLIN PO Take by mouth. Take 4 one hour prior to dental procedures due to her mitral valve prolapse    . chlorthalidone (HYGROTON) 25 MG tablet Take 1 tablet (25 mg total) by mouth daily. 30 tablet 6  . esomeprazole (NEXIUM) 20 MG capsule Take 20 mg by mouth daily at 12 noon.    . fluticasone (FLONASE) 50 MCG/ACT nasal spray SHAKE LQ AND U 2 SPRAYS IEN QD  5  . hydroxypropyl methylcellulose (ISOPTO TEARS) 2.5 % ophthalmic solution Place 1 drop into both eyes 2 (two) times daily. Dry eyes    . levothyroxine (SYNTHROID, LEVOTHROID) 75 MCG tablet Take 75 mcg by mouth daily with breakfast.    . loratadine (CLARITIN) 10 MG tablet Take 10 mg by mouth daily.     . Magnesium 500 MG  TABS Take 500 mg by mouth daily.    . naproxen sodium (ANAPROX) 220 MG tablet Take 220 mg by mouth 2 (two) times daily with a meal.    . potassium chloride (K-DUR,KLOR-CON) 10 MEQ tablet Take 1 tablet by mouth daily.  12  . RETIN-A 0.1 % cream Apply 1 application topically 2 (two) times a week.     . rosuvastatin (CRESTOR) 10 MG tablet Take 10 mg by mouth at bedtime.     Marland Kitchen VITAMIN D, CHOLECALCIFEROL, PO Take 1 tablet by mouth daily.     No current facility-administered medications for this visit.     Past Medical History:  Diagnosis Date  . Adenomatous colon polyp   . Asthma   . CAD (coronary artery disease)    Chest CTA 10/07: 40% or less pLAD;  ETT 8/09: negative  . Carotid bruit   . Chronic back pain   . Depression   . Diverticulosis   . DJD (degenerative joint disease)   . Dyslipidemia   . Dysrhythmia    pvcs  . Fatty liver   . GERD (gastroesophageal reflux disease)   . HTN (hypertension)   . Hypothyroidism   . IBS (irritable bowel syndrome)   . Internal hemorrhoid   . Melanoma ([place])  metastatic; left leg; s/p multiple excisions  . Migraine   . Osteoporosis   . Spinal stenosis     Past Surgical History:  Procedure Laterality Date  . BACK SURGERY    . BLEPHAROPLASTY    . BUNIONECTOMY WITH HAMMERTOE RECONSTRUCTION Right 01/16/2013   Procedure: RIGHT Barbie Banner OSTEOTOMY WITH SIMPLE BUNIONECTOMY AND SECOND HAMMER TOE CORRECTION;  Surgeon: Wylene Simmer, MD;  Location: [place];  Service: Orthopedics;  Laterality: Right;  . CARPAL TUNNEL RELEASE    . KNEE ARTHROSCOPY Right   . LUMBAR LAMINECTOMY/DECOMPRESSION MICRODISCECTOMY N/A 11/13/2013   Procedure: LUMBAR TWO-THREE,LUMBAR THREE-FOUR,LUMBAR FOUR-FIVE LAMINECTOMY AND FORAMINOTOMY;  Surgeon: Ophelia Charter, MD;  Location: [place] [facility];  Service: Neurosurgery;  Laterality: N/A;  . MELANOMA EXCISION Left 09/19/2012   Procedure: WIDE EXCISION METASTATIC MELANOMA LEFT MEDIAL THIGH;  Surgeon: Zenovia Jarred,  MD;  Location: [place];  Service: General;  Laterality: Left;  . ROTATOR CUFF REPAIR    . SHOULDER SURGERY    . TOE SURGERY    . TOTAL HIP ARTHROPLASTY  09/04/2011   Procedure: TOTAL HIP ARTHROPLASTY;  Surgeon: Gearlean Alf, MD;  Location: [facility];  Service: Orthopedics;  Laterality: Left;    ROS:    As stated in the HPI and negative for all other systems.  PHYSICAL EXAM BP 139/88   Pulse 80   Ht 5\' 5"  (1.651 m)   Wt 136 lb (61.7 kg)   SpO2 96%   BMI 22.63 kg/m   GENERAL:  Well appearing NECK:  No jugular venous distention, waveform within normal limits, carotid upstroke brisk and symmetric, no bruits, no thyromegaly LUNGS:  Clear to auscultation bilaterally CHEST:  Unremarkable HEART:  PMI not displaced or sustained,S1 and S2 within normal limits, no S3, no S4, no clicks, no rubs, no murmurs ABD:  Flat, positive bowel sounds normal in frequency in pitch, no bruits, no rebound, no guarding, no midline pulsatile mass, no hepatomegaly, no splenomegaly EXT:  2 plus pulses throughout, no edema, no cyanosis no clubbing    ASSESSMENT AND PLAN   HYPERTENSION  Her BP is good not perfect and we agree not to let perfect be the enemy of good particularly since she has had problems with low BPs and is sensitive to meds.  She will remain on the current medication.  She is clearly given instructions and should have no questions this year.   PVCs She is not feeling these.

## 2017-04-18 ENCOUNTER — Encounter: Payer: Self-pay | Admitting: Cardiology

## 2017-04-18 ENCOUNTER — Ambulatory Visit: Payer: Medicare Other | Admitting: Cardiology

## 2017-04-18 VITALS — BP 139/88 | HR 80 | Ht 65.0 in | Wt 136.0 lb

## 2017-04-18 DIAGNOSIS — I10 Essential (primary) hypertension: Secondary | ICD-10-CM

## 2017-04-18 MED ORDER — CHLORTHALIDONE 25 MG PO TABS: 25.0000 mg | ORAL_TABLET | Freq: Every day | ORAL | 3 refills | Status: DC

## 2017-04-18 NOTE — Patient Instructions (Signed)
Medication Instructions:  Continue current medications  If you need a refill on your cardiac medications before your next appointment, please call your pharmacy.  Labwork: None Ordered   Testing/Procedures: None Ordered  Follow-Up: Your physician wants you to follow-up in: 3 months.      Thank you for choosing [facility] at [facility]!!       

## 2017-04-25 DIAGNOSIS — M170 Bilateral primary osteoarthritis of knee: Secondary | ICD-10-CM | POA: Diagnosis not present

## 2017-05-03 DIAGNOSIS — M1711 Unilateral primary osteoarthritis, right knee: Secondary | ICD-10-CM | POA: Diagnosis not present

## 2017-05-03 DIAGNOSIS — Z471 Aftercare following joint replacement surgery: Secondary | ICD-10-CM | POA: Diagnosis not present

## 2017-05-03 DIAGNOSIS — M25551 Pain in right hip: Secondary | ICD-10-CM | POA: Diagnosis not present

## 2017-05-03 DIAGNOSIS — M1712 Unilateral primary osteoarthritis, left knee: Secondary | ICD-10-CM | POA: Diagnosis not present

## 2017-05-10 DIAGNOSIS — M170 Bilateral primary osteoarthritis of knee: Secondary | ICD-10-CM | POA: Diagnosis not present

## 2017-05-16 DIAGNOSIS — M170 Bilateral primary osteoarthritis of knee: Secondary | ICD-10-CM | POA: Diagnosis not present

## 2017-05-17 DIAGNOSIS — G894 Chronic pain syndrome: Secondary | ICD-10-CM | POA: Diagnosis not present

## 2017-05-17 DIAGNOSIS — M5136 Other intervertebral disc degeneration, lumbar region: Secondary | ICD-10-CM | POA: Diagnosis not present

## 2017-05-17 DIAGNOSIS — M961 Postlaminectomy syndrome, not elsewhere classified: Secondary | ICD-10-CM | POA: Diagnosis not present

## 2017-05-18 DIAGNOSIS — M25551 Pain in right hip: Secondary | ICD-10-CM | POA: Diagnosis not present

## 2017-05-18 DIAGNOSIS — M1611 Unilateral primary osteoarthritis, right hip: Secondary | ICD-10-CM | POA: Diagnosis not present

## 2017-05-18 DIAGNOSIS — M1612 Unilateral primary osteoarthritis, left hip: Secondary | ICD-10-CM | POA: Diagnosis not present

## 2017-05-18 DIAGNOSIS — G90512 Complex regional pain syndrome I of left upper limb: Secondary | ICD-10-CM | POA: Diagnosis not present

## 2017-05-23 DIAGNOSIS — M170 Bilateral primary osteoarthritis of knee: Secondary | ICD-10-CM | POA: Diagnosis not present

## 2017-05-31 DIAGNOSIS — M1611 Unilateral primary osteoarthritis, right hip: Secondary | ICD-10-CM | POA: Diagnosis not present

## 2017-05-31 DIAGNOSIS — M89062 Algoneurodystrophy, left lower leg: Secondary | ICD-10-CM | POA: Diagnosis not present

## 2017-06-13 DIAGNOSIS — L570 Actinic keratosis: Secondary | ICD-10-CM | POA: Diagnosis not present

## 2017-06-13 DIAGNOSIS — D229 Melanocytic nevi, unspecified: Secondary | ICD-10-CM | POA: Diagnosis not present

## 2017-06-14 ENCOUNTER — Other Ambulatory Visit: Payer: Self-pay | Admitting: Internal Medicine

## 2017-06-14 DIAGNOSIS — Z1231 Encounter for screening mammogram for malignant neoplasm of breast: Secondary | ICD-10-CM

## 2017-06-18 ENCOUNTER — Ambulatory Visit
Admission: RE | Admit: 2017-06-18 | Discharge: 2017-06-18 | Disposition: A | Discharge status: Home / Self Care | Payer: Medicare Other | Source: Ambulatory Visit | Attending: Internal Medicine | Admitting: Internal Medicine

## 2017-06-18 DIAGNOSIS — Z1231 Encounter for screening mammogram for malignant neoplasm of breast: Secondary | ICD-10-CM

## 2017-06-20 DIAGNOSIS — D229 Melanocytic nevi, unspecified: Secondary | ICD-10-CM | POA: Diagnosis not present

## 2017-06-20 DIAGNOSIS — L570 Actinic keratosis: Secondary | ICD-10-CM | POA: Diagnosis not present

## 2017-07-04 DIAGNOSIS — H9042 Sensorineural hearing loss, unilateral, left ear, with unrestricted hearing on the contralateral side: Secondary | ICD-10-CM | POA: Diagnosis not present

## 2017-07-17 NOTE — Progress Notes (Signed)
HPI The patient presents for evaluation of HTN.  Since I last saw her she has done OK.  She has lots of stress with her husband who has dementia.  She is also an exacting person as she admits.  The patient denies any new symptoms such as chest discomfort, neck or arm discomfort. There has been no new shortness of breath, PND or orthopnea. There have been no reported palpitations, presyncope or syncope.  She is limited by joint pain.   Allergies  Allergen Reactions  . Buprenorphine Hcl Itching    Happened a while back. Patient noted that any narcotic based medication makes her have a bad reaction.  . Codeine Nausea And Vomiting    Patient gets sick on stomach  . Morphine And Related Itching    Happened a while back. Patient noted that any narcotic based medication makes her have a bad reaction.  . Tramadol Nausea And Vomiting and Other (See Comments)    confusion  . Adhesive [Tape] Other (See Comments)    Tears skin   . Other Other (See Comments)  . Hydrocodone-Acetaminophen Nausea And Vomiting  . Lactose Intolerance (Gi) Other (See Comments)    Gi upset   . Oxycodone Nausea And Vomiting    Current Outpatient Medications  Medication Sig Dispense Refill  . AMOXICILLIN PO Take by mouth. Take 4 one hour prior to dental procedures due to her mitral valve prolapse    . esomeprazole (NEXIUM) 20 MG capsule Take 20 mg by mouth daily at 12 noon.    . fluticasone (FLONASE) 50 MCG/ACT nasal spray SHAKE LQ AND U 2 SPRAYS IEN QD  5  . gabapentin (NEURONTIN) 300 MG capsule Take 300 mg by mouth 3 (three) times daily.    . hydroxypropyl methylcellulose (ISOPTO TEARS) 2.5 % ophthalmic solution Place 1 drop into both eyes 2 (two) times daily. Dry eyes    . levothyroxine (SYNTHROID, LEVOTHROID) 75 MCG tablet Take 75 mcg by mouth daily with breakfast.    . loratadine (CLARITIN) 10 MG tablet Take 10 mg by mouth daily.     . Magnesium 500 MG TABS Take 500 mg by mouth daily.    . naproxen  sodium (ANAPROX) 220 MG tablet Take 220 mg by mouth 2 (two) times daily with a meal.    . potassium chloride (K-DUR,KLOR-CON) 10 MEQ tablet Take 1 tablet by mouth daily.  12  . RETIN-A 0.1 % cream Apply 1 application topically 2 (two) times a week.     . rosuvastatin (CRESTOR) 10 MG tablet Take 10 mg by mouth at bedtime.     Marland Kitchen VITAMIN D, CHOLECALCIFEROL, PO Take 1 tablet by mouth daily.    . chlorthalidone (HYGROTON) 25 MG tablet Take 1 tablet (25 mg total) by mouth daily. 90 tablet 3   No current facility-administered medications for this visit.     Past Medical History:  Diagnosis Date  . Adenomatous colon polyp   . Asthma   . CAD (coronary artery disease)    Chest CTA 10/07: 40% or less pLAD;  ETT 8/09: negative  . Carotid bruit   . Chronic back pain   . Depression   . Diverticulosis   . DJD (degenerative joint disease)   . Dyslipidemia   . Dysrhythmia    pvcs  . Fatty liver   . GERD (gastroesophageal reflux disease)   . HTN (hypertension)   . Hypothyroidism   . IBS (irritable bowel syndrome)   .  Internal hemorrhoid   . Melanoma ([place])    metastatic; left leg; s/p multiple excisions  . Migraine   . Osteoporosis   . Spinal stenosis     Past Surgical History:  Procedure Laterality Date  . BACK SURGERY    . BLEPHAROPLASTY    . BUNIONECTOMY WITH HAMMERTOE RECONSTRUCTION Right 01/16/2013   Procedure: RIGHT Barbie Banner OSTEOTOMY WITH SIMPLE BUNIONECTOMY AND SECOND HAMMER TOE CORRECTION;  Surgeon: Wylene Simmer, MD;  Location: [place];  Service: Orthopedics;  Laterality: Right;  . CARPAL TUNNEL RELEASE    . KNEE ARTHROSCOPY Right   . LUMBAR LAMINECTOMY/DECOMPRESSION MICRODISCECTOMY N/A 11/13/2013   Procedure: LUMBAR TWO-THREE,LUMBAR THREE-FOUR,LUMBAR FOUR-FIVE LAMINECTOMY AND FORAMINOTOMY;  Surgeon: Ophelia Charter, MD;  Location: [place] [facility];  Service: Neurosurgery;  Laterality: N/A;  . MELANOMA EXCISION Left 09/19/2012   Procedure: WIDE EXCISION METASTATIC  MELANOMA LEFT MEDIAL THIGH;  Surgeon: Zenovia Jarred, MD;  Location: [place];  Service: General;  Laterality: Left;  . ROTATOR CUFF REPAIR    . SHOULDER SURGERY    . TOE SURGERY    . TOTAL HIP ARTHROPLASTY  09/04/2011   Procedure: TOTAL HIP ARTHROPLASTY;  Surgeon: Gearlean Alf, MD;  Location: [facility];  Service: Orthopedics;  Laterality: Left;    ROS:    As stated in the HPI and negative for all other systems.   PHYSICAL EXAM BP 118/62   Pulse 72   Ht 5\' 5"  (1.651 m)   Wt 137 lb (62.1 kg)   BMI 22.80 kg/m   GENERAL:  Well appearing NECK:  No jugular venous distention, waveform within normal limits, carotid upstroke brisk and symmetric, no bruits, no thyromegaly LUNGS:  Clear to auscultation bilaterally CHEST:  Unremarkable HEART:  PMI not displaced or sustained,S1 and S2 within normal limits, no S3, no S4, no clicks, no rubs, no murmurs ABD:  Flat, positive bowel sounds normal in frequency in pitch, no bruits, no rebound, no guarding, no midline pulsatile mass, no hepatomegaly, no splenomegaly EXT:  2 plus pulses throughout, no edema, no cyanosis no clubbing   ASSESSMENT AND PLAN   HYPERTENSION  Her BP is well controlled.  She is not checking it as much which helps.  No change in meds.  I will check a BMET.   PVCs She is not feeling these.  No change in therapy.  Do a centimeter

## 2017-07-19 ENCOUNTER — Encounter: Payer: Self-pay | Admitting: Cardiology

## 2017-07-19 ENCOUNTER — Ambulatory Visit: Payer: Medicare Other | Admitting: Cardiology

## 2017-07-19 VITALS — BP 118/62 | HR 72 | Ht 65.0 in | Wt 137.0 lb

## 2017-07-19 DIAGNOSIS — Z79899 Other long term (current) drug therapy: Secondary | ICD-10-CM | POA: Diagnosis not present

## 2017-07-19 DIAGNOSIS — I493 Ventricular premature depolarization: Secondary | ICD-10-CM

## 2017-07-19 DIAGNOSIS — I10 Essential (primary) hypertension: Secondary | ICD-10-CM | POA: Diagnosis not present

## 2017-07-19 NOTE — Patient Instructions (Addendum)
Medication Instructions:  Continue current medications  If you need a refill on your cardiac medications before your next appointment, please call your pharmacy.  Labwork: BMP  Testing/Procedures: None Ordered  Follow-Up: Your physician wants you to follow-up in: 6 Months. You should receive a reminder letter in the mail two months in advance. If you do not receive a letter, please call our office [phone].    Thank you for choosing [facility] at [facility]!!

## 2017-07-20 ENCOUNTER — Telehealth: Payer: Self-pay | Admitting: *Deleted

## 2017-07-20 DIAGNOSIS — M89062 Algoneurodystrophy, left lower leg: Secondary | ICD-10-CM | POA: Diagnosis not present

## 2017-07-20 DIAGNOSIS — Z79899 Other long term (current) drug therapy: Secondary | ICD-10-CM

## 2017-07-20 LAB — BASIC METABOLIC PANEL
BUN/Creatinine Ratio: 21 (ref 12–28)
BUN: 18 mg/dL (ref 8–27)
CO2: 27 mmol/L (ref 20–29)
Calcium: 9.8 mg/dL (ref 8.7–10.3)
Chloride: 96 mmol/L (ref 96–106)
Creatinine, Ser: 0.84 mg/dL (ref 0.57–1.00)
GFR calc Af Amer: 73 mL/min/{1.73_m2} (ref >59)
GFR calc non Af Amer: 64 mL/min/{1.73_m2} (ref >59)
Glucose: 81 mg/dL (ref 65–99)
Potassium: 3.3 mmol/L — ABNORMAL LOW (ref 3.5–5.2)
Sodium: 140 mmol/L (ref 134–144)

## 2017-07-20 NOTE — Telephone Encounter (Signed)
-----   Message from Minus Breeding, MD sent at 07/20/2017  8:02 AM EST ----- She should take an extra 10 meq of potassium for the next four days then continue with 10 meq daily and increase the potassium containing foods.   Repeat a BMET in 1 month.  Call Ms. Dettmann with the results and send results to Lavone Orn, MD

## 2017-07-20 NOTE — Telephone Encounter (Signed)
bmp ordered

## 2017-07-27 DIAGNOSIS — E876 Hypokalemia: Secondary | ICD-10-CM | POA: Diagnosis not present

## 2017-07-27 DIAGNOSIS — I10 Essential (primary) hypertension: Secondary | ICD-10-CM | POA: Diagnosis not present

## 2017-07-30 ENCOUNTER — Telehealth: Payer: Self-pay | Admitting: Cardiology

## 2017-07-30 NOTE — Telephone Encounter (Signed)
With that HR she should get an EKG.  BPs are OK.  They fluctuate in this patient.

## 2017-07-30 NOTE — Telephone Encounter (Signed)
Left message to call back  

## 2017-07-30 NOTE — Telephone Encounter (Signed)
Pt c/o BP issue: STAT if pt c/o blurred vision, one-sided weakness or slurred speech  1. What are your last 5 BP readings? Yesterday: 109/70 HR 102// 115/74 HR 90// 95/62 HR 109// 98/69 HR 105  This morning: 105/69 HR 99  2. Are you having any other symptoms (ex. Dizziness, headache, blurred vision, passed out)? No symptoms reported  3. What is your BP issue? Running high

## 2017-07-30 NOTE — Telephone Encounter (Signed)
Patient made aware of Dr. Rosezella Florida recommendations. She will come in tomorrow for an EKG at 1:20 pm.

## 2017-07-30 NOTE — Telephone Encounter (Signed)
Returned the call to the patient. She stated that she has been light headed since yesterday. Her blood pressure readings from yesterday were:  109/68 103/70  HR 102 115/74  HR 90 95/62    HR 109 98/69    HR 109  Today it was 105/69. She has been informed that theses readings are not bad but she would like Dr. Rosezella Florida opinion on the reading. She denies shortness of breath and chest pain. She has been advised to rise slowly from a sitting position since she has been light headed.

## 2017-07-31 ENCOUNTER — Ambulatory Visit (INDEPENDENT_AMBULATORY_CARE_PROVIDER_SITE_OTHER): Payer: Medicare Other | Admitting: *Deleted

## 2017-07-31 DIAGNOSIS — I493 Ventricular premature depolarization: Secondary | ICD-10-CM | POA: Diagnosis not present

## 2017-07-31 DIAGNOSIS — I10 Essential (primary) hypertension: Secondary | ICD-10-CM

## 2017-07-31 DIAGNOSIS — R319 Hematuria, unspecified: Secondary | ICD-10-CM | POA: Diagnosis not present

## 2017-07-31 NOTE — Addendum Note (Signed)
Addended by: Merlene Laughter on: 07/31/2017 02:08 PM   Modules accepted: Level of Service

## 2017-07-31 NOTE — Progress Notes (Signed)
Patient present to office today to have EKG per recent phone note.

## 2017-08-03 DIAGNOSIS — M1611 Unilateral primary osteoarthritis, right hip: Secondary | ICD-10-CM | POA: Diagnosis not present

## 2017-08-03 DIAGNOSIS — M792 Neuralgia and neuritis, unspecified: Secondary | ICD-10-CM | POA: Diagnosis not present

## 2017-08-03 DIAGNOSIS — G5790 Unspecified mononeuropathy of unspecified lower limb: Secondary | ICD-10-CM | POA: Diagnosis not present

## 2017-08-15 DIAGNOSIS — L570 Actinic keratosis: Secondary | ICD-10-CM | POA: Diagnosis not present

## 2017-08-15 DIAGNOSIS — L728 Other follicular cysts of the skin and subcutaneous tissue: Secondary | ICD-10-CM | POA: Diagnosis not present

## 2017-08-15 DIAGNOSIS — D229 Melanocytic nevi, unspecified: Secondary | ICD-10-CM | POA: Diagnosis not present

## 2017-08-28 DIAGNOSIS — M5416 Radiculopathy, lumbar region: Secondary | ICD-10-CM | POA: Diagnosis not present

## 2017-08-28 DIAGNOSIS — M5136 Other intervertebral disc degeneration, lumbar region: Secondary | ICD-10-CM | POA: Diagnosis not present

## 2017-09-12 DIAGNOSIS — M5416 Radiculopathy, lumbar region: Secondary | ICD-10-CM | POA: Diagnosis not present

## 2017-09-21 DIAGNOSIS — M170 Bilateral primary osteoarthritis of knee: Secondary | ICD-10-CM | POA: Diagnosis not present

## 2017-10-03 DIAGNOSIS — M26621 Arthralgia of right temporomandibular joint: Secondary | ICD-10-CM | POA: Diagnosis not present

## 2017-10-25 ENCOUNTER — Encounter (HOSPITAL_COMMUNITY): Payer: Self-pay | Admitting: Emergency Medicine

## 2017-10-25 ENCOUNTER — Ambulatory Visit (HOSPITAL_COMMUNITY): Payer: Medicare Other

## 2017-10-25 ENCOUNTER — Emergency Department (HOSPITAL_COMMUNITY)
Admission: EM | Admit: 2017-10-25 | Discharge: 2017-10-25 | Disposition: A | Discharge status: Home / Self Care | Payer: Medicare Other | Attending: Emergency Medicine | Admitting: Emergency Medicine

## 2017-10-25 DIAGNOSIS — R140 Abdominal distension (gaseous): Secondary | ICD-10-CM | POA: Diagnosis not present

## 2017-10-25 DIAGNOSIS — R1030 Lower abdominal pain, unspecified: Secondary | ICD-10-CM | POA: Diagnosis not present

## 2017-10-25 DIAGNOSIS — Z87891 Personal history of nicotine dependence: Secondary | ICD-10-CM | POA: Diagnosis not present

## 2017-10-25 DIAGNOSIS — Z79899 Other long term (current) drug therapy: Secondary | ICD-10-CM | POA: Insufficient documentation

## 2017-10-25 DIAGNOSIS — E785 Hyperlipidemia, unspecified: Secondary | ICD-10-CM | POA: Insufficient documentation

## 2017-10-25 DIAGNOSIS — E039 Hypothyroidism, unspecified: Secondary | ICD-10-CM | POA: Insufficient documentation

## 2017-10-25 DIAGNOSIS — J45909 Unspecified asthma, uncomplicated: Secondary | ICD-10-CM | POA: Diagnosis not present

## 2017-10-25 DIAGNOSIS — R1031 Right lower quadrant pain: Secondary | ICD-10-CM | POA: Diagnosis not present

## 2017-10-25 DIAGNOSIS — I10 Essential (primary) hypertension: Secondary | ICD-10-CM | POA: Diagnosis not present

## 2017-10-25 DIAGNOSIS — R1011 Right upper quadrant pain: Secondary | ICD-10-CM | POA: Diagnosis not present

## 2017-10-25 LAB — CBC
HCT: 43.7 % (ref 36.0–46.0)
Hemoglobin: 14.9 g/dL (ref 12.0–15.0)
MCH: 31.8 pg (ref 26.0–34.0)
MCHC: 34.1 g/dL (ref 30.0–36.0)
MCV: 93.2 fL (ref 78.0–100.0)
Platelets: 194 10*3/uL (ref 150–400)
RBC: 4.69 MIL/uL (ref 3.87–5.11)
RDW: 13.7 % (ref 11.5–15.5)
WBC: 6.8 10*3/uL (ref 4.0–10.5)

## 2017-10-25 LAB — COMPREHENSIVE METABOLIC PANEL
ALT: 28 U/L (ref 14–54)
AST: 29 U/L (ref 15–41)
Albumin: 4.3 g/dL (ref 3.5–5.0)
Alkaline Phosphatase: 63 U/L (ref 38–126)
Anion gap: 10 (ref 5–15)
BUN: 21 mg/dL — ABNORMAL HIGH (ref 6–20)
CO2: 30 mmol/L (ref 22–32)
Calcium: 9.7 mg/dL (ref 8.9–10.3)
Chloride: 101 mmol/L (ref 101–111)
Creatinine, Ser: 0.78 mg/dL (ref 0.44–1.00)
GFR calc Af Amer: >60 mL/min (ref >60)
GFR calc non Af Amer: >60 mL/min (ref >60)
Glucose, Bld: 104 mg/dL — ABNORMAL HIGH (ref 65–99)
Potassium: 2.8 mmol/L — ABNORMAL LOW (ref 3.5–5.1)
Sodium: 141 mmol/L (ref 135–145)
Total Bilirubin: 0.5 mg/dL (ref 0.3–1.2)
Total Protein: 7.4 g/dL (ref 6.5–8.1)

## 2017-10-25 LAB — URINALYSIS, ROUTINE W REFLEX MICROSCOPIC
Bilirubin Urine: NEGATIVE
Glucose, UA: NEGATIVE mg/dL
Hgb urine dipstick: NEGATIVE
Ketones, ur: NEGATIVE mg/dL
Nitrite: NEGATIVE
Protein, ur: NEGATIVE mg/dL
Specific Gravity, Urine: 1.013 (ref 1.005–1.030)
pH: 6 (ref 5.0–8.0)

## 2017-10-25 LAB — LIPASE, BLOOD: Lipase: 26 U/L (ref 11–51)

## 2017-10-25 MED ORDER — MAGNESIUM OXIDE 400 (241.3 MG) MG PO TABS
800.0000 mg | ORAL_TABLET | Freq: Once | ORAL | Status: AC
  Administered 2017-10-25: 800 mg via ORAL
  Filled 2017-10-25: qty 2

## 2017-10-25 MED ORDER — POTASSIUM CHLORIDE CRYS ER 20 MEQ PO TBCR
40.0000 meq | EXTENDED_RELEASE_TABLET | Freq: Once | ORAL | Status: AC
  Administered 2017-10-25: 40 meq via ORAL
  Filled 2017-10-25: qty 2

## 2017-10-25 NOTE — ED Triage Notes (Signed)
Per [facility] pt from PCP for abd pains and some abd distention that started this am around 8am. Also for pt have CT scan. Vitals 160/102, 88Hr, 18R.

## 2017-10-25 NOTE — ED Notes (Signed)
Pt told staff she is leaving. Staff asked pt just to wait until we could get her discharge papers. Pt agreed.

## 2017-10-25 NOTE — ED Provider Notes (Signed)
[facility] DEPT Provider Note   CSN: [id number] Arrival date & time: 10/25/17  1705     History   Chief Complaint Chief Complaint  Patient presents with  . Abdominal Pain    HPI Kerri [name] is a 84 y.o. female.  84 yo F with a chief complaint of lower abdominal pain.  This is been a recurrent issue for her.  She does not normally sought medical attention.  States that her abdomen will become significantly distended she will find a recumbent position and then passed a lot of flatus and then will feel much better.  She denies vomiting with these episodes.  She felt that today was much worse and it was localized to the right lower quadrant.  She denied any mass or bulge.  She had some worsening distention just left of her umbilicus that is different for her.  She went to her family physician who obtained lab work.  He felt that she needed a CT scan of the abdomen pelvis and so sent her to the emergency department.  Since then her pain is resolved.  She denied emesis with this denied diarrhea denied vomiting.  The history is provided by the patient.  Abdominal Pain   This is a new problem. The current episode started 6 to 12 hours ago. The problem occurs constantly. The problem has been resolved. The pain is associated with an unknown factor. The pain is located in the RLQ. The quality of the pain is sharp and shooting. The pain is at a severity of 10/10. The pain is severe. Associated symptoms include flatus. Pertinent negatives include fever, nausea, vomiting, dysuria, headaches, arthralgias and myalgias. Nothing aggravates the symptoms. Nothing relieves the symptoms.    Past Medical History:  Diagnosis Date  . Adenomatous colon polyp   . Asthma   . CAD (coronary artery disease)    Chest CTA 10/07: 40% or less pLAD;  ETT 8/09: negative  . Carotid bruit   . Chronic back pain   . Depression   . Diverticulosis   . DJD (degenerative joint disease)   .  Dyslipidemia   . Dysrhythmia    pvcs  . Fatty liver   . GERD (gastroesophageal reflux disease)   . HTN (hypertension)   . Hypothyroidism   . IBS (irritable bowel syndrome)   . Internal hemorrhoid   . Melanoma ([place])    metastatic; left leg; s/p multiple excisions  . Migraine   . Osteoporosis   . Spinal stenosis     Patient Active Problem List   Diagnosis Date Noted  . Dizziness 01/30/2017  . Lumbar stenosis with neurogenic claudication 11/13/2013  . Sebaceous cyst 11/20/2012  . Squamous cell carcinoma in situ of skin of calf 04/03/2012  . Postop Hypokalemia 09/08/2011  . Postop Acute blood loss anemia 09/05/2011  . Osteoarthritis of hip 09/04/2011  . Melanoma of lower leg ([place]) 05/03/2011  . PVC's (premature ventricular contractions) 07/30/2009  . HYPOTHYROIDISM 05/10/2009  . DYSLIPIDEMIA 05/10/2009  . Essential hypertension 05/10/2009  . CAD 05/10/2009  . DEGENERATIVE JOINT DISEASE 05/10/2009  . SPINAL STENOSIS 05/10/2009  . CAROTID BRUIT 05/10/2009    Past Surgical History:  Procedure Laterality Date  . BACK SURGERY    . BLEPHAROPLASTY    . BUNIONECTOMY WITH HAMMERTOE RECONSTRUCTION Right 01/16/2013   Procedure: RIGHT Barbie Banner OSTEOTOMY WITH SIMPLE BUNIONECTOMY AND SECOND HAMMER TOE CORRECTION;  Surgeon: Wylene Simmer, MD;  Location: [facility];  Service: Orthopedics;  Laterality: Right;  .  CARPAL TUNNEL RELEASE    . KNEE ARTHROSCOPY Right   . LUMBAR LAMINECTOMY/DECOMPRESSION MICRODISCECTOMY N/A 11/13/2013   Procedure: LUMBAR TWO-THREE,LUMBAR THREE-FOUR,LUMBAR FOUR-FIVE LAMINECTOMY AND FORAMINOTOMY;  Surgeon: Ophelia Charter, MD;  Location: [facility];  Service: Neurosurgery;  Laterality: N/A;  . MELANOMA EXCISION Left 09/19/2012   Procedure: WIDE EXCISION METASTATIC MELANOMA LEFT MEDIAL THIGH;  Surgeon: Zenovia Jarred, MD;  Location: [facility];  Service: General;  Laterality: Left;  . ROTATOR CUFF REPAIR    . SHOULDER SURGERY    . TOE  SURGERY    . TOTAL HIP ARTHROPLASTY  09/04/2011   Procedure: TOTAL HIP ARTHROPLASTY;  Surgeon: Gearlean Alf, MD;  Location: [facility];  Service: Orthopedics;  Laterality: Left;     OB History   None      Home Medications    Prior to Admission medications   Medication Sig Start Date End Date Taking? Authorizing Provider  acidophilus (RISAQUAD) CAPS capsule Take 1 capsule by mouth daily.   Yes [provider]  AMOXICILLIN PO Take by mouth. Take 4 one hour prior to dental procedures due to her mitral valve prolapse   Yes [provider]  chlorthalidone (HYGROTON) 25 MG tablet Take 1 tablet (25 mg total) by mouth daily. 04/18/17 10/25/17 Yes Minus Breeding, MD  esomeprazole (NEXIUM) 20 MG capsule Take 20 mg by mouth daily at 12 noon.   Yes [provider]  fluticasone (FLONASE) 50 MCG/ACT nasal spray SHAKE LQ AND U 2 SPRAYS IEN QD 04/08/17  Yes [provider]  gabapentin (NEURONTIN) 300 MG capsule Take 300 mg by mouth 3 (three) times daily.   Yes [provider]  hydroxypropyl methylcellulose (ISOPTO TEARS) 2.5 % ophthalmic solution Place 1 drop into both eyes 2 (two) times daily. Dry eyes   Yes [provider]  levothyroxine (SYNTHROID, LEVOTHROID) 75 MCG tablet Take 75 mcg by mouth daily with breakfast.   Yes [provider]  loratadine (CLARITIN) 10 MG tablet Take 10 mg by mouth daily.    Yes [provider]  Magnesium 500 MG TABS Take 500 mg by mouth daily.   Yes [provider]  MELATONIN PO Take 1 tablet by mouth at bedtime.   Yes [provider]  potassium chloride (K-DUR,KLOR-CON) 10 MEQ tablet Take 1 tablet by mouth daily. 03/27/17  Yes [provider]  RETIN-A 0.1 % cream Apply 1 application topically every other day.  03/26/12  Yes [provider]  rosuvastatin (CRESTOR) 10 MG tablet Take 10 mg by mouth at bedtime.    Yes [provider]  traMADol (ULTRAM) 50 MG tablet  Take 50 mg by mouth at bedtime.    Yes [provider]  VITAMIN D, CHOLECALCIFEROL, PO Take 1 tablet by mouth daily.   Yes [provider]    Family History Family History  Problem Relation Age of Onset  . CAD Mother   . CAD Father   . Kidney disease Child     Social History Social History   Tobacco Use  . Smoking status: Former Smoker    Types: Cigarettes    Last attempt to quit: 08/31/1948    Years since quitting: 69.1  . Smokeless tobacco: Never Used  Substance Use Topics  . Alcohol use: Yes  . Drug use: No     Allergies   Buprenorphine hcl; Codeine; Morphine and related; Tramadol; Adhesive [tape]; Other; Hydrocodone-acetaminophen; Lactose intolerance (gi); and Oxycodone   Review of Systems Review  of Systems  Constitutional: Negative for chills and fever.  HENT: Negative for congestion and rhinorrhea.   Eyes: Negative for redness and visual disturbance.  Respiratory: Negative for shortness of breath and wheezing.   Cardiovascular: Negative for chest pain and palpitations.  Gastrointestinal: Positive for abdominal distention, abdominal pain and flatus. Negative for nausea and vomiting.  Genitourinary: Negative for dysuria and urgency.  Musculoskeletal: Negative for arthralgias and myalgias.  Skin: Negative for pallor and wound.  Neurological: Negative for dizziness and headaches.     Physical Exam Updated Vital Signs BP (!) 143/83 (BP Location: Left Arm)   Pulse 60   Temp (!) 97.5 F (36.4 C) (Oral)   Resp 18   Ht 5\' 5"  (1.651 m)   Wt 60 kg (132 lb 3 oz)   SpO2 94%   BMI 22.00 kg/m   Physical Exam  Constitutional: She is oriented to person, place, and time. She appears well-developed and well-nourished. No distress.  HENT:  Head: Normocephalic and atraumatic.  Eyes: Pupils are equal, round, and reactive to light. EOM are normal.  Neck: Normal range of motion. Neck supple.  Cardiovascular: Normal rate and regular rhythm. Exam reveals  no gallop and no friction rub.  No murmur heard. Pulmonary/Chest: Effort normal. She has no wheezes. She has no rales.  Abdominal: Soft. She exhibits no distension and no mass. There is no tenderness. There is no guarding.  Musculoskeletal: She exhibits no edema or tenderness.  Neurological: She is alert and oriented to person, place, and time.  Skin: Skin is warm and dry. She is not diaphoretic.  Psychiatric: She has a normal mood and affect. Her behavior is normal.  Nursing note and vitals reviewed.    ED Treatments / Results  Labs (all labs ordered are listed, but only abnormal results are displayed) Labs Reviewed  COMPREHENSIVE METABOLIC PANEL - Abnormal; Notable for the following components:      Result Value   Potassium 2.8 (*)    Glucose, Bld 104 (*)    BUN 21 (*)    All other components within normal limits  URINALYSIS, ROUTINE W REFLEX MICROSCOPIC - Abnormal; Notable for the following components:   APPearance HAZY (*)    Leukocytes, UA LARGE (*)    Bacteria, UA RARE (*)    All other components within normal limits  LIPASE, BLOOD  CBC    EKG None  Radiology No results found.  Procedures Procedures (including critical care time)  Medications Ordered in ED Medications  potassium chloride SA (K-DUR,KLOR-CON) CR tablet 40 mEq (40 mEq Oral Given 10/25/17 2157)  magnesium oxide (MAG-OX) tablet 800 mg (800 mg Oral Given 10/25/17 2157)     Initial Impression / Assessment and Plan / ED Course  I have reviewed the triage vital signs and the nursing notes.  Pertinent labs & imaging results that were available during my care of the patient were reviewed by me and considered in my medical decision making (see chart for details).     84 yo F with a chief complaints of right lower quadrant abdominal pain.  She was sent by her PCP for CT scan.  On my exam without any focal abdominal tenderness.  I reviewed the CT as this is why she was sent here.  The patient unfortunately  was unwilling to wait for her scan.  She felt that should have been done immediately.  She will call her PCP in the morning to get this done as an outpatient.  He is  currently asymptomatic and is tolerating by mouth.  UA with 11-20 whites but rare bacteria.  No urinary symptoms.  She is hypokalemic. This was repleted orally. Not anemic.  Will discharge home.  11:02 PM:  I have discussed the diagnosis/risks/treatment options with the patient and believe the pt to be eligible for discharge home to follow-up with PCP. We also discussed returning to the ED immediately if new or worsening sx occur. We discussed the sx which are most concerning (e.g., sudden worsening pain, fever, inability to tolerate by mouth) that necessitate immediate return. Medications administered to the patient during their visit and any new prescriptions provided to the patient are listed below.  Medications given during this visit Medications  potassium chloride SA (K-DUR,KLOR-CON) CR tablet 40 mEq (40 mEq Oral Given 10/25/17 2157)  magnesium oxide (MAG-OX) tablet 800 mg (800 mg Oral Given 10/25/17 2157)      The patient appears reasonably screen and/or stabilized for discharge and I doubt any other medical condition or other [facility] requiring further screening, evaluation, or treatment in the ED at this time prior to discharge.    Final Clinical Impressions(s) / ED Diagnoses   Final diagnoses:  Right lower quadrant abdominal pain    ED Discharge Orders    None       Deno Etienne, DO 10/25/17 2303

## 2017-10-25 NOTE — ED Notes (Signed)
Nurse attempted to place IV for pt to get CT scan. Pt stated "I don't wan to go through all of that just to ssit here for hours waiting on my CT scan. How long is it going to be before they come get me?" Nurse explained to pt that timing is unknown but will ask CT staff. When nurse told pt there are at least 4 pts in line ahead of her pt stated "Well I can just get my scan at my doctors office tomorrow. I'm calling my daughter to come get me."

## 2017-10-25 NOTE — Discharge Instructions (Signed)
Please return for any worsening symptoms.  Call your family doctor in the morning.

## 2017-10-25 NOTE — ED Notes (Signed)
Patient came to desk stating that her ride was here and she needed to leave. Patient agreed to wait for paper but refused vital signs due to her daughter waiting.

## 2017-10-26 ENCOUNTER — Other Ambulatory Visit: Payer: Self-pay | Admitting: Internal Medicine

## 2017-10-26 ENCOUNTER — Ambulatory Visit
Admission: RE | Admit: 2017-10-26 | Discharge: 2017-10-26 | Disposition: A | Discharge status: Home / Self Care | Payer: Medicare Other | Source: Ambulatory Visit | Attending: Internal Medicine | Admitting: Internal Medicine

## 2017-10-26 DIAGNOSIS — R140 Abdominal distension (gaseous): Secondary | ICD-10-CM

## 2017-10-26 DIAGNOSIS — R109 Unspecified abdominal pain: Secondary | ICD-10-CM | POA: Diagnosis not present

## 2017-10-26 MED ORDER — IOPAMIDOL (ISOVUE-300) INJECTION 61%
100.0000 mL | Freq: Once | INTRAVENOUS | Status: AC | PRN
  Administered 2017-10-26: 100 mL via INTRAVENOUS

## 2017-10-30 DIAGNOSIS — R350 Frequency of micturition: Secondary | ICD-10-CM | POA: Diagnosis not present

## 2017-10-30 DIAGNOSIS — K219 Gastro-esophageal reflux disease without esophagitis: Secondary | ICD-10-CM | POA: Diagnosis not present

## 2017-10-30 DIAGNOSIS — E876 Hypokalemia: Secondary | ICD-10-CM | POA: Diagnosis not present

## 2017-10-30 DIAGNOSIS — R634 Abnormal weight loss: Secondary | ICD-10-CM | POA: Diagnosis not present

## 2017-11-02 DIAGNOSIS — E876 Hypokalemia: Secondary | ICD-10-CM | POA: Diagnosis not present

## 2017-11-05 ENCOUNTER — Other Ambulatory Visit: Payer: Self-pay | Admitting: *Deleted

## 2017-11-05 ENCOUNTER — Encounter: Payer: Self-pay | Admitting: *Deleted

## 2017-11-05 DIAGNOSIS — C439 Malignant melanoma of skin, unspecified: Secondary | ICD-10-CM | POA: Insufficient documentation

## 2017-11-07 ENCOUNTER — Other Ambulatory Visit: Payer: Self-pay | Admitting: Physician Assistant

## 2017-11-07 DIAGNOSIS — D0462 Carcinoma in situ of skin of left upper limb, including shoulder: Secondary | ICD-10-CM | POA: Diagnosis not present

## 2017-11-07 DIAGNOSIS — L570 Actinic keratosis: Secondary | ICD-10-CM | POA: Diagnosis not present

## 2017-11-09 ENCOUNTER — Ambulatory Visit: Payer: Medicare Other | Admitting: Physician Assistant

## 2017-11-09 ENCOUNTER — Encounter: Payer: Self-pay | Admitting: Physician Assistant

## 2017-11-09 VITALS — BP 120/78 | HR 71 | Ht 65.0 in | Wt 126.0 lb

## 2017-11-09 DIAGNOSIS — A480 Gas gangrene: Secondary | ICD-10-CM

## 2017-11-09 DIAGNOSIS — R140 Abdominal distension (gaseous): Secondary | ICD-10-CM | POA: Diagnosis not present

## 2017-11-09 DIAGNOSIS — R634 Abnormal weight loss: Secondary | ICD-10-CM | POA: Diagnosis not present

## 2017-11-09 DIAGNOSIS — R1031 Right lower quadrant pain: Secondary | ICD-10-CM | POA: Diagnosis not present

## 2017-11-09 DIAGNOSIS — K581 Irritable bowel syndrome with constipation: Secondary | ICD-10-CM | POA: Diagnosis not present

## 2017-11-09 NOTE — Patient Instructions (Addendum)
We have provided you with a low gas diet. We have also provided you with samples of Xifaxan 550 mg.  Take 1 tablet by mouth 3 times daily for 14 days.   We made you an appointment with Nicoletta Ba PA for 12-07-17 at 1:30 PM.   If you are age 82 or older, your body mass index should be between 23-30. Your Body mass index is 20.97 kg/m. If this is out of the aforementioned range listed, please consider follow up with your Primary Care Provider.

## 2017-11-09 NOTE — Progress Notes (Addendum)
Subjective:    Patient ID: Kerri [name], female    DOB: January 16, 1930, 84 y.o.   MRN: [id number]  HPI Kerri [name] is a pleasant 84 year old white female, known to Dr. Hilarie Fredrickson who comes in today with complaints of ongoing problems with gas and bloating she has had for many years.  She had a recent episode of severe abdominal pain on 10/25/2017 which took her to the emergency room.  She says this was different in any of her chronic GI issues and was fairly severe.  She had pain for several hours prior to going to the emergency room.  She says this was primarily loaded located in the right lower abdomen and sharp in nature.  She did not have any associated nausea or vomiting diarrhea fever chills etc.  By the time she was seen in the emergency room the pain had pretty much resolved and has not recurred.  She mentions that within a week before that occurrence she had also had some flank pain. CT of the abdomen and pelvis was done on 10/26/2017 which did show fatty liver otherwise negative study.   Labs show potassium of 2.8 BUN 21 creatinine 0.76 LFTs within normal limits as was CBC. She brought labs with her today from her primary care Lavone Orn.  She  is currently being treated for a UTI with an abnormal UA on 11/02/2017 potassium had corrected to 3.3.   Her last colonoscopy and EGD were done in 2012 by Dr. Earle Gell.  She does have history of adenomatous colon polyps with a 5 mm adenoma removed in 2012.  EGD at that time was negative with exception of the fundic gland polyp. Other problems include hypertension, coronary artery disease, hypothyroidism, spinal stenosis, osteoarthritis, history of melanoma. Patient had also sustained some recent weight loss from 139 down to 124 this year.  She says that she is been under a tremendous amount of stress as her husband has been diagnosed with dementia and has required a lot of care over the past few months.  She has had to hire help into the home to help him bathe etc.   She says she was not getting much rest because she would worry about him getting up at night.  She says she is been able to sleep a little bit better recently with having help in the home early in the mornings and hopes to continue that.  Says she feels that her weight has stabilized, but again feels that she is eating a fairly normal amount of food no complaints of nausea vomiting or changes in her bowel habit from her usual.  She does use MiraLAX on a daily basis which works well for her. Review of Systems Pertinent positive and negative review of systems were noted in the above HPI section.  All other review of systems was otherwise negative.  Outpatient Encounter Medications as of 11/09/2017  Medication Sig  . acidophilus (RISAQUAD) CAPS capsule Take 1 capsule by mouth daily.  . AMOXICILLIN PO Take by mouth. Take 4 one hour prior to dental procedures due to her mitral valve prolapse  . esomeprazole (NEXIUM) 20 MG capsule Take 20 mg by mouth daily at 12 noon.  . fluticasone (FLONASE) 50 MCG/ACT nasal spray SHAKE LQ AND U 2 SPRAYS IEN QD  . gabapentin (NEURONTIN) 300 MG capsule Take 300 mg by mouth 3 (three) times daily.  Marland Kitchen HYDRALAZINE HCL PO Take 10 mg by mouth. Take 1 tablet with food orally if BP ocer 170/100.  Marland Kitchen  hydroxypropyl methylcellulose (ISOPTO TEARS) 2.5 % ophthalmic solution Place 1 drop into both eyes 2 (two) times daily. Dry eyes  . levothyroxine (SYNTHROID, LEVOTHROID) 75 MCG tablet Take 75 mcg by mouth daily with breakfast.  . loratadine (CLARITIN) 10 MG tablet Take 10 mg by mouth daily.   . Magnesium 500 MG TABS Take 500 mg by mouth daily.  Marland Kitchen MELATONIN PO Take 1 tablet by mouth at bedtime.  . Polyethylene Glycol 3350 (MIRALAX PO) Take by mouth. Take 17 grams in 8 oz of water as needed for constipation.  . potassium chloride (K-DUR,KLOR-CON) 10 MEQ tablet Take 1 tablet by mouth daily.  Marland Kitchen RETIN-A 0.1 % cream Apply 1 application topically every other day.   . rosuvastatin (CRESTOR)  10 MG tablet Take 10 mg by mouth at bedtime.   . traMADol (ULTRAM) 50 MG tablet Take 50 mg by mouth at bedtime.   Marland Kitchen VITAMIN D, CHOLECALCIFEROL, PO Take 1 tablet by mouth daily.  . chlorthalidone (HYGROTON) 25 MG tablet Take 1 tablet (25 mg total) by mouth daily.   No facility-administered encounter medications on file as of 11/09/2017.    Allergies  Allergen Reactions  . Buprenorphine Hcl Itching    Happened a while back. Patient noted that any narcotic based medication makes her have a bad reaction.  . Codeine Nausea And Vomiting    Patient gets sick on stomach  . Morphine And Related Itching    Happened a while back. Patient noted that any narcotic based medication makes her have a bad reaction.  . Tramadol Nausea And Vomiting and Other (See Comments)    Confusion Can tolerate one tablet at night  . Adhesive [Tape] Other (See Comments)    Tears skin   . Other Other (See Comments)  . Hydrocodone-Acetaminophen Nausea And Vomiting  . Lactose Intolerance (Gi) Other (See Comments)    Gi upset   . Oxycodone Nausea And Vomiting   Patient Active Problem List   Diagnosis Date Noted  . Melanoma ([place])   . Dizziness 01/30/2017  . Lumbar stenosis with neurogenic claudication 11/13/2013  . Sebaceous cyst 11/20/2012  . Squamous cell carcinoma in situ of skin of calf 04/03/2012  . Postop Hypokalemia 09/08/2011  . Postop Acute blood loss anemia 09/05/2011  . Osteoarthritis of hip 09/04/2011  . Melanoma of lower leg ([place]) 05/03/2011  . PVC's (premature ventricular contractions) 07/30/2009  . HYPOTHYROIDISM 05/10/2009  . DYSLIPIDEMIA 05/10/2009  . Essential hypertension 05/10/2009  . CAD 05/10/2009  . DEGENERATIVE JOINT DISEASE 05/10/2009  . SPINAL STENOSIS 05/10/2009  . CAROTID BRUIT 05/10/2009   Social History   Socioeconomic History  . Marital status: Married    Spouse name: Not on file  . Number of children: 4  . Years of education: Not on file  . Highest education level: Not  on file  Occupational History  . Occupation: retired  Scientific laboratory technician  . Financial resource strain: Not on file  . Food insecurity:    Worry: Not on file    Inability: Not on file  . Transportation needs:    Medical: Not on file    Non-medical: Not on file  Tobacco Use  . Smoking status: Former Smoker    Types: Cigarettes    Last attempt to quit: 08/31/1948    Years since quitting: 69.2  . Smokeless tobacco: Never Used  Substance and Sexual Activity  . Alcohol use: Yes  . Drug use: No  . Sexual activity: Never  Lifestyle  . Physical  activity:    Days per week: Not on file    Minutes per session: Not on file  . Stress: Not on file  Relationships  . Social connections:    Talks on phone: Not on file    Gets together: Not on file    Attends religious service: Not on file    Active member of club or organization: Not on file    Attends meetings of clubs or organizations: Not on file    Relationship status: Not on file  . Intimate partner violence:    Fear of current or ex partner: Not on file    Emotionally abused: Not on file    Physically abused: Not on file    Forced sexual activity: Not on file  Other Topics Concern  . Not on file  Social History Narrative  . Not on file    Ms. Causey's family history includes CAD in her father and mother; Kidney disease in her child.      Objective:    Vitals:   11/09/17 1422  BP: 120/78  Pulse: 71    Physical Exam; well-developed elderly white female in no acute distress, appears younger than her stated age, very pleasant blood pressure 120/78 pulse 71, height 5 foot 5, weight 126, BMI 20.9.  HEENT; nontraumatic normocephalic EOMI PERRLA sclera anicteric oropharynx clear, Cardiovascular ;regular rate and rhythm with S1-S2 no murmur rub or gallop, Pulmonary; clear bilaterally, Abdomen; bowel sounds somewhat hyperactive she has some mild distention, there is no focal tenderness no guarding or rebound no palpable mass or  hepatosplenomegaly.  Rectal; exam not done, Extremities ;no clubbing cyanosis or edema skin warm and dry, Neuro psych; alert and oriented, grossly nonfocal mood and affect appropriate       Assessment & Plan:   #42 84 year old female with recent episode of acute right-sided abdominal pain/right lower quadrant that lasted for several hours and then resolved.  Patient also mentions that she had some flank pain within a week prior to this episode lasted for a day or so and then resolved. Etiology is not clear,, question ureterolithiasis.  Negative CT imaging on 10/26/2017 She subsequently developed a UTI and is on antibiotics currently.  #2 chronic abdominal bloating gas and mild constipation that she manages with MiraLAX.  Ongoing symptoms for many years.  Most consistent with IBS, rule out component of bacterial overgrowth #3 weight loss-15 pounds over the past 6 to 7 months-recent labs and imaging reassuring.  I suspect that this is secondary to significant personal stress, dealing with her husband's decline in health and dementia. #4 prior history of adenomatous colon polyps-last colonoscopy 2012 #5 coronary artery disease #6.  Hypothyroidism #7 Spinal stenosis #8  History of melanoma   #9 hypertension  #10 GERD stable on Nexium 20 mg daily  Plan; Patient was advised should she have recurrence acute pain to call for further advice.  Would consider imaging during an episode at least with plain films. For chronic symptoms we will give her a course of Xifaxan 550 mg p.o. 3 times daily x14 days Start low gas diet She has been using artificial sweeteners, will discontinue and substitute with Stevia or regular sugar Try Gas-X on an as-needed basis  Will plan to follow-up in 1 month with myself    Philopateer Strine Genia Harold PA-C 11/09/2017   Cc: Lavone Orn, MD  Addendum: Reviewed and agree with management. Pyrtle, Lajuan Lines, MD

## 2017-11-15 ENCOUNTER — Other Ambulatory Visit: Payer: Self-pay | Admitting: Nurse Practitioner

## 2017-11-15 DIAGNOSIS — M79605 Pain in left leg: Secondary | ICD-10-CM | POA: Diagnosis not present

## 2017-11-15 DIAGNOSIS — C4372 Malignant melanoma of left lower limb, including hip: Secondary | ICD-10-CM

## 2017-11-15 DIAGNOSIS — M810 Age-related osteoporosis without current pathological fracture: Secondary | ICD-10-CM | POA: Diagnosis not present

## 2017-11-16 ENCOUNTER — Other Ambulatory Visit: Payer: Self-pay | Admitting: Nurse Practitioner

## 2017-11-16 ENCOUNTER — Ambulatory Visit
Admission: RE | Admit: 2017-11-16 | Discharge: 2017-11-16 | Disposition: A | Discharge status: Home / Self Care | Payer: Medicare Other | Source: Ambulatory Visit | Attending: Nurse Practitioner | Admitting: Nurse Practitioner

## 2017-11-16 DIAGNOSIS — C4372 Malignant melanoma of left lower limb, including hip: Secondary | ICD-10-CM

## 2017-11-17 ENCOUNTER — Ambulatory Visit
Admission: RE | Admit: 2017-11-17 | Discharge: 2017-11-17 | Disposition: A | Discharge status: Home / Self Care | Payer: Medicare Other | Source: Ambulatory Visit | Attending: Nurse Practitioner | Admitting: Nurse Practitioner

## 2017-11-17 DIAGNOSIS — C4372 Malignant melanoma of left lower limb, including hip: Secondary | ICD-10-CM

## 2017-11-17 DIAGNOSIS — C439 Malignant melanoma of skin, unspecified: Secondary | ICD-10-CM | POA: Diagnosis not present

## 2017-11-17 MED ORDER — GADOBENATE DIMEGLUMINE 529 MG/ML IV SOLN
11.0000 mL | Freq: Once | INTRAVENOUS | Status: AC | PRN
  Administered 2017-11-17: 11 mL via INTRAVENOUS

## 2017-11-23 ENCOUNTER — Other Ambulatory Visit: Payer: Medicare Other

## 2017-11-29 DIAGNOSIS — M1611 Unilateral primary osteoarthritis, right hip: Secondary | ICD-10-CM | POA: Diagnosis not present

## 2017-11-30 ENCOUNTER — Other Ambulatory Visit (HOSPITAL_COMMUNITY): Payer: Self-pay | Admitting: Orthopedic Surgery

## 2017-11-30 DIAGNOSIS — Z96642 Presence of left artificial hip joint: Secondary | ICD-10-CM

## 2017-11-30 DIAGNOSIS — M1612 Unilateral primary osteoarthritis, left hip: Secondary | ICD-10-CM | POA: Diagnosis not present

## 2017-11-30 DIAGNOSIS — M1611 Unilateral primary osteoarthritis, right hip: Secondary | ICD-10-CM | POA: Diagnosis not present

## 2017-12-05 DIAGNOSIS — R300 Dysuria: Secondary | ICD-10-CM | POA: Diagnosis not present

## 2017-12-06 DIAGNOSIS — R300 Dysuria: Secondary | ICD-10-CM | POA: Diagnosis not present

## 2017-12-07 ENCOUNTER — Ambulatory Visit: Payer: Medicare Other | Admitting: Physician Assistant

## 2017-12-11 ENCOUNTER — Other Ambulatory Visit: Payer: Self-pay | Admitting: Physician Assistant

## 2017-12-11 DIAGNOSIS — C44629 Squamous cell carcinoma of skin of left upper limb, including shoulder: Secondary | ICD-10-CM | POA: Diagnosis not present

## 2017-12-12 ENCOUNTER — Encounter (HOSPITAL_COMMUNITY)
Admission: RE | Admit: 2017-12-12 | Discharge: 2017-12-12 | Disposition: A | Discharge status: Home / Self Care | Payer: Medicare Other | Source: Ambulatory Visit | Attending: Orthopedic Surgery | Admitting: Orthopedic Surgery

## 2017-12-12 ENCOUNTER — Encounter (HOSPITAL_COMMUNITY): Admission: RE | Admit: 2017-12-12 | Payer: Medicare Other | Source: Ambulatory Visit

## 2017-12-12 DIAGNOSIS — Z96642 Presence of left artificial hip joint: Secondary | ICD-10-CM | POA: Diagnosis not present

## 2017-12-12 DIAGNOSIS — Z471 Aftercare following joint replacement surgery: Secondary | ICD-10-CM | POA: Diagnosis not present

## 2017-12-12 MED ORDER — TECHNETIUM TC 99M MEDRONATE IV KIT
20.3000 | PACK | Freq: Once | INTRAVENOUS | Status: AC | PRN
  Administered 2017-12-12: 20.3 via INTRAVENOUS

## 2018-01-09 ENCOUNTER — Telehealth: Payer: Self-pay | Admitting: Internal Medicine

## 2018-01-09 NOTE — Telephone Encounter (Signed)
Pt states she had a big bowel movement this morning and then she had BRB dripping from her rectum. Pt states the bleeding has stopped now and she has not had anymore. Discussed with pt that she should let us know if she has anymore bleeding, pt verbalized understanding. Offered her an OV but pt declined, states she has a fractured femur. Pt states she has a history of hemorrhoids and a fissure.

## 2018-01-16 DIAGNOSIS — M170 Bilateral primary osteoarthritis of knee: Secondary | ICD-10-CM | POA: Diagnosis not present

## 2018-01-24 DIAGNOSIS — M25552 Pain in left hip: Secondary | ICD-10-CM | POA: Diagnosis not present

## 2018-01-24 DIAGNOSIS — M170 Bilateral primary osteoarthritis of knee: Secondary | ICD-10-CM | POA: Diagnosis not present

## 2018-01-25 DIAGNOSIS — M170 Bilateral primary osteoarthritis of knee: Secondary | ICD-10-CM | POA: Diagnosis not present

## 2018-01-31 DIAGNOSIS — M1712 Unilateral primary osteoarthritis, left knee: Secondary | ICD-10-CM | POA: Diagnosis not present

## 2018-01-31 DIAGNOSIS — M1711 Unilateral primary osteoarthritis, right knee: Secondary | ICD-10-CM | POA: Diagnosis not present

## 2018-02-08 DIAGNOSIS — M25552 Pain in left hip: Secondary | ICD-10-CM | POA: Diagnosis not present

## 2018-02-08 DIAGNOSIS — M170 Bilateral primary osteoarthritis of knee: Secondary | ICD-10-CM | POA: Diagnosis not present

## 2018-02-15 DIAGNOSIS — M170 Bilateral primary osteoarthritis of knee: Secondary | ICD-10-CM | POA: Diagnosis not present

## 2018-02-18 DIAGNOSIS — R300 Dysuria: Secondary | ICD-10-CM | POA: Diagnosis not present

## 2018-02-25 DIAGNOSIS — I950 Idiopathic hypotension: Secondary | ICD-10-CM | POA: Diagnosis not present

## 2018-02-25 DIAGNOSIS — R0602 Shortness of breath: Secondary | ICD-10-CM | POA: Diagnosis not present

## 2018-03-04 NOTE — Progress Notes (Signed)
Cardiology Office Note   Date:  03/05/2018   ID:  Kerri [name], DOB March 28, 1930, MRN [id number]  PCP:  Kerri Orn, MD  Cardiologist:  [facility]  Chief Complaint  Patient presents with  . Coronary Artery Disease  . Hypertension     History of Present Illness: Kerri [name] is a 84 y.o. female who presents for ongoing assessment and management of of hypertension, non-obstructive CAD, HL, GERD, Hypothyroidism. Last seen in 07/19/2017 and was stable.    She comes today for annual follow up. She was seen by her PCP earlier today. She was taken off of chlorthalidone due to hypotension and also potassium She was instructed to hold these medications for BP <409 systolic. She states that she has been under a great deal of stress and has lost weight which may explain the drop in BP. She has recently placed her husband in SNF due to worsening dementia. She is otherwise doing well. She denies symptoms of chest pain or dyspnea. She has trouble with her knees and is considering knee replacement as she is having limited ambulation and has to use a rolling walker.   Past Medical History:  Diagnosis Date  . Adenomatous colon polyp   . Asthma   . CAD (coronary artery disease)    Chest CTA 10/07: 40% or less pLAD;  ETT 8/09: negative  . Carotid bruit   . Chronic back pain   . Depression   . Diverticulosis   . DJD (degenerative joint disease)   . Dyslipidemia   . Dysrhythmia    pvcs  . Fatty liver   . GERD (gastroesophageal reflux disease)   . HTN (hypertension)   . Hypothyroidism   . IBS (irritable bowel syndrome)   . Internal hemorrhoid   . Melanoma ([place])    metastatic; left leg; s/p multiple excisions  . Migraine   . Osteoporosis   . Spinal stenosis     Past Surgical History:  Procedure Laterality Date  . BACK SURGERY    . BLEPHAROPLASTY    . BUNIONECTOMY WITH HAMMERTOE RECONSTRUCTION Right 01/16/2013   Procedure: RIGHT Barbie Banner OSTEOTOMY WITH SIMPLE BUNIONECTOMY AND SECOND HAMMER TOE  CORRECTION;  Surgeon: Kerri Simmer, MD;  Location: [place];  Service: Orthopedics;  Laterality: Right;  . CARPAL TUNNEL RELEASE    . KNEE ARTHROSCOPY Right   . LUMBAR LAMINECTOMY/DECOMPRESSION MICRODISCECTOMY N/A 11/13/2013   Procedure: LUMBAR TWO-THREE,LUMBAR THREE-FOUR,LUMBAR FOUR-FIVE LAMINECTOMY AND FORAMINOTOMY;  Surgeon: Kerri Charter, MD;  Location: [facility];  Service: Neurosurgery;  Laterality: N/A;  . MELANOMA EXCISION Left 09/19/2012   Procedure: WIDE EXCISION METASTATIC MELANOMA LEFT MEDIAL THIGH;  Surgeon: Kerri Jarred, MD;  Location: [place];  Service: General;  Laterality: Left;  . ROTATOR CUFF REPAIR    . SHOULDER SURGERY    . TOE SURGERY    . TOTAL HIP ARTHROPLASTY  09/04/2011   Procedure: TOTAL HIP ARTHROPLASTY;  Surgeon: Kerri Alf, MD;  Location: [facility];  Service: Orthopedics;  Laterality: Left;     Current Outpatient Medications  Medication Sig Dispense Refill  . acidophilus (RISAQUAD) CAPS capsule Take 1 capsule by mouth daily.    . AMOXICILLIN PO Take by mouth. Take 4 one hour prior to dental procedures due to her mitral valve prolapse    . esomeprazole (NEXIUM) 20 MG capsule Take 20 mg by mouth daily at 12 noon.    . fluticasone (FLONASE) 50 MCG/ACT nasal spray SHAKE LQ AND U 2 SPRAYS IEN QD  5  . gabapentin (NEURONTIN) 300 MG capsule Take 300 mg by mouth 3 (three) times daily.    . hydroxypropyl methylcellulose (ISOPTO TEARS) 2.5 % ophthalmic solution Place 1 drop into both eyes 2 (two) times daily. Dry eyes    . levothyroxine (SYNTHROID, LEVOTHROID) 75 MCG tablet Take 75 mcg by mouth daily with breakfast.    . loratadine (CLARITIN) 10 MG tablet Take 10 mg by mouth daily.     . Magnesium 500 MG TABS Take 500 mg by mouth daily.    Marland Kitchen MELATONIN PO Take 1 tablet by mouth at bedtime.    . Polyethylene Glycol 3350 (MIRALAX PO) Take by mouth. Take 17 grams in 8 oz of water as needed for constipation.    . potassium chloride  (K-DUR,KLOR-CON) 10 MEQ tablet Take 10 mEq by mouth daily as needed (Only takes when taking Chlorthalidone).   12  . RETIN-A 0.1 % cream Apply 1 application topically every other day.     . rosuvastatin (CRESTOR) 10 MG tablet Take 10 mg by mouth at bedtime.     . traMADol (ULTRAM) 50 MG tablet Take 50 mg by mouth at bedtime.     Marland Kitchen VITAMIN D, CHOLECALCIFEROL, PO Take 1 tablet by mouth daily.    . chlorthalidone (HYGROTON) 25 MG tablet Take 1 tablet (25 mg total) by mouth daily. (Patient taking differently: Take 25 mg by mouth daily. Only takes if bp over 671 systolic) 90 tablet 3   No current facility-administered medications for this visit.     Allergies:   Buprenorphine hcl; Codeine; Morphine and related; Tramadol; Adhesive [tape]; Other; Hydrocodone-acetaminophen; Lactose intolerance (gi); and Oxycodone    Social History:  The patient  reports that she quit smoking about 69 years ago. Her smoking use included cigarettes. She has never used smokeless tobacco. She reports that she drinks alcohol. She reports that she does not use drugs.   Family History:  The patient's family history includes CAD in her father and mother; Kidney disease in her child.    ROS: All other systems are reviewed and negative. Unless otherwise mentioned in H&P    PHYSICAL EXAM: VS:  BP 115/70   Pulse (!) 59   Ht 5\' 5"  (1.651 m)   Wt 127 lb (57.6 kg)   BMI 21.13 kg/m  , BMI Body mass index is 21.13 kg/m. GEN: Well nourished, well developed, in no acute distress HEENT: normal Neck: no JVD, carotid bruits, or masses Cardiac: RRR; 2/6 systolic murmur heard best at the RSB, no, rubs, or gallops,no edema  Respiratory:  Clear to auscultation bilaterally, normal work of breathing GI: soft, nontender, nondistended, + BS MS: no deformity or atrophy Skin: warm and dry, no rash Neuro:  Strength and sensation are intact Psych: euthymic mood, full affect   EKG:  Not completed this office visit. Had one completed  this am at PCP. Will request.   Recent Labs: 10/25/2017: ALT 28; BUN 21; Creatinine, Ser 0.78; Hemoglobin 14.9; Platelets 194; Potassium 2.8; Sodium 141    Lipid Panel No results found for: CHOL, TRIG, HDL, CHOLHDL, VLDL, LDLCALC, LDLDIRECT    Wt Readings from Last 3 Encounters:  03/05/18 127 lb (57.6 kg)  11/09/17 126 lb (57.2 kg)  10/25/17 132 lb 3 oz (60 kg)   ASSESSMENT AND PLAN:  1.  CAD: CTA revealing 40%v or less in the proximal LAD. Treated medically. She is asymptomatic at this time. No planned ischemic testing as she is without complaints,  2. Hypertension: She has lost weight due to family stress coupled with IBS. BP has been lower. PCP has discontinued daily chlorthalidone and potassium, and is to use prn for BP <378 systolic. Recent labs were drawn by PCP.   3. Dyslipidemia: She continues on Crestor. Labs per PCP. She reports they were normal.   4. Chronic knee pain: Limiting her ambulation. Treated medically for now. She is considering repair.   Current medicines are reviewed at length with the patient today.    Labs/ tests ordered today include: None   Phill Myron. [place] Pugh, ANP, AACC   03/05/2018 2:37 PM    [address] Office [phone] Fax [phone]

## 2018-03-05 ENCOUNTER — Ambulatory Visit: Payer: Medicare Other | Admitting: Adult Health

## 2018-03-05 ENCOUNTER — Encounter: Payer: Self-pay | Admitting: Adult Health

## 2018-03-05 VITALS — BP 115/70 | HR 59 | Ht 65.0 in | Wt 127.0 lb

## 2018-03-05 DIAGNOSIS — I10 Essential (primary) hypertension: Secondary | ICD-10-CM | POA: Diagnosis not present

## 2018-03-05 DIAGNOSIS — I2510 Atherosclerotic heart disease of native coronary artery without angina pectoris: Secondary | ICD-10-CM | POA: Diagnosis not present

## 2018-03-05 DIAGNOSIS — I950 Idiopathic hypotension: Secondary | ICD-10-CM | POA: Diagnosis not present

## 2018-03-05 DIAGNOSIS — E7800 Pure hypercholesterolemia, unspecified: Secondary | ICD-10-CM

## 2018-03-05 NOTE — Patient Instructions (Signed)
Follow-Up: You will need a follow up appointment in 73 MONTHS.  Please call our office 2 months in advance(AUGUST 2020) to schedule the (October 2020) appointment.  You may see  DR Percival Spanish, -or- one of the following Advanced Practice Providers on your designated Care Team:   . Rosaria Ferries, PA-C . Jory Sims, DNP, ANP .  Medication Instructions:  NO CHANGES- Your physician recommends that you continue on your current medications as directed. Please refer to the Current Medication list given to you today.  If you need a refill on your cardiac medications before your next appointment, please call your pharmacy.  Labwork: If you have labs (blood work) drawn today and your tests are completely normal, you will receive your results ONLY by: . MyChart Message (if you have MyChart) -OR- . A paper copy in the mail  At [facility], you and your health needs are our priority.  As part of our continuing mission to provide you with exceptional heart care, we have created designated Provider Care Teams.  These Care Teams include your primary Cardiologist (physician) and Advanced Practice Providers (APPs -  Physician Assistants and Nurse Practitioners) who all work together to provide you with the care you need, when you need it.  Thank you for choosing [facility] at [facility]!!

## 2018-03-11 DIAGNOSIS — H01005 Unspecified blepharitis left lower eyelid: Secondary | ICD-10-CM | POA: Diagnosis not present

## 2018-03-11 DIAGNOSIS — H01004 Unspecified blepharitis left upper eyelid: Secondary | ICD-10-CM | POA: Diagnosis not present

## 2018-03-11 DIAGNOSIS — H01002 Unspecified blepharitis right lower eyelid: Secondary | ICD-10-CM | POA: Diagnosis not present

## 2018-03-11 DIAGNOSIS — H01001 Unspecified blepharitis right upper eyelid: Secondary | ICD-10-CM | POA: Diagnosis not present

## 2018-03-19 ENCOUNTER — Other Ambulatory Visit: Payer: Self-pay | Admitting: Cardiology

## 2018-03-21 DIAGNOSIS — M170 Bilateral primary osteoarthritis of knee: Secondary | ICD-10-CM | POA: Diagnosis not present

## 2018-03-21 DIAGNOSIS — M1712 Unilateral primary osteoarthritis, left knee: Secondary | ICD-10-CM | POA: Diagnosis not present

## 2018-03-21 DIAGNOSIS — M5416 Radiculopathy, lumbar region: Secondary | ICD-10-CM | POA: Diagnosis not present

## 2018-03-25 DIAGNOSIS — M5416 Radiculopathy, lumbar region: Secondary | ICD-10-CM | POA: Diagnosis not present

## 2018-03-25 DIAGNOSIS — Z139 Encounter for screening, unspecified: Secondary | ICD-10-CM | POA: Diagnosis not present

## 2018-03-25 DIAGNOSIS — M961 Postlaminectomy syndrome, not elsewhere classified: Secondary | ICD-10-CM | POA: Diagnosis not present

## 2018-03-28 ENCOUNTER — Other Ambulatory Visit: Payer: Self-pay | Admitting: Physician Assistant

## 2018-03-28 DIAGNOSIS — L570 Actinic keratosis: Secondary | ICD-10-CM | POA: Diagnosis not present

## 2018-03-28 DIAGNOSIS — D485 Neoplasm of uncertain behavior of skin: Secondary | ICD-10-CM | POA: Diagnosis not present

## 2018-04-01 DIAGNOSIS — M5416 Radiculopathy, lumbar region: Secondary | ICD-10-CM | POA: Diagnosis not present

## 2018-04-03 ENCOUNTER — Other Ambulatory Visit (HOSPITAL_COMMUNITY): Payer: Self-pay | Admitting: Physician Assistant

## 2018-04-03 DIAGNOSIS — M1711 Unilateral primary osteoarthritis, right knee: Secondary | ICD-10-CM

## 2018-04-05 DIAGNOSIS — K219 Gastro-esophageal reflux disease without esophagitis: Secondary | ICD-10-CM | POA: Diagnosis not present

## 2018-04-05 DIAGNOSIS — I10 Essential (primary) hypertension: Secondary | ICD-10-CM | POA: Diagnosis not present

## 2018-04-05 DIAGNOSIS — E538 Deficiency of other specified B group vitamins: Secondary | ICD-10-CM | POA: Diagnosis not present

## 2018-04-05 DIAGNOSIS — E782 Mixed hyperlipidemia: Secondary | ICD-10-CM | POA: Diagnosis not present

## 2018-04-05 DIAGNOSIS — M810 Age-related osteoporosis without current pathological fracture: Secondary | ICD-10-CM | POA: Diagnosis not present

## 2018-04-08 DIAGNOSIS — M89062 Algoneurodystrophy, left lower leg: Secondary | ICD-10-CM | POA: Diagnosis not present

## 2018-04-08 DIAGNOSIS — M5136 Other intervertebral disc degeneration, lumbar region: Secondary | ICD-10-CM | POA: Diagnosis not present

## 2018-04-08 DIAGNOSIS — M5416 Radiculopathy, lumbar region: Secondary | ICD-10-CM | POA: Diagnosis not present

## 2018-04-11 DIAGNOSIS — Z96642 Presence of left artificial hip joint: Secondary | ICD-10-CM | POA: Diagnosis not present

## 2018-04-11 DIAGNOSIS — Z471 Aftercare following joint replacement surgery: Secondary | ICD-10-CM | POA: Diagnosis not present

## 2018-04-11 DIAGNOSIS — M170 Bilateral primary osteoarthritis of knee: Secondary | ICD-10-CM | POA: Diagnosis not present

## 2018-04-16 ENCOUNTER — Encounter (HOSPITAL_COMMUNITY)
Admission: RE | Admit: 2018-04-16 | Discharge: 2018-04-16 | Disposition: A | Discharge status: Home / Self Care | Payer: Medicare Other | Source: Ambulatory Visit | Attending: Physician Assistant | Admitting: Physician Assistant

## 2018-04-16 DIAGNOSIS — M1711 Unilateral primary osteoarthritis, right knee: Secondary | ICD-10-CM

## 2018-04-16 DIAGNOSIS — M79652 Pain in left thigh: Secondary | ICD-10-CM | POA: Diagnosis not present

## 2018-04-16 MED ORDER — TECHNETIUM TC 99M MEDRONATE IV KIT
22.0000 | PACK | Freq: Once | INTRAVENOUS | Status: AC | PRN
  Administered 2018-04-16: 22 via INTRAVENOUS

## 2018-04-30 DIAGNOSIS — R300 Dysuria: Secondary | ICD-10-CM | POA: Diagnosis not present

## 2018-05-09 DIAGNOSIS — M25552 Pain in left hip: Secondary | ICD-10-CM | POA: Diagnosis not present

## 2018-05-16 ENCOUNTER — Other Ambulatory Visit: Payer: Self-pay | Admitting: Internal Medicine

## 2018-05-16 DIAGNOSIS — Z1231 Encounter for screening mammogram for malignant neoplasm of breast: Secondary | ICD-10-CM

## 2018-05-23 DIAGNOSIS — G43509 Persistent migraine aura without cerebral infarction, not intractable, without status migrainosus: Secondary | ICD-10-CM | POA: Diagnosis not present

## 2018-06-20 ENCOUNTER — Ambulatory Visit: Payer: Medicare Other

## 2018-06-21 DIAGNOSIS — L309 Dermatitis, unspecified: Secondary | ICD-10-CM | POA: Diagnosis not present

## 2018-06-21 DIAGNOSIS — F419 Anxiety disorder, unspecified: Secondary | ICD-10-CM | POA: Diagnosis not present

## 2018-06-21 DIAGNOSIS — L089 Local infection of the skin and subcutaneous tissue, unspecified: Secondary | ICD-10-CM | POA: Diagnosis not present

## 2018-06-21 DIAGNOSIS — R21 Rash and other nonspecific skin eruption: Secondary | ICD-10-CM | POA: Diagnosis not present

## 2018-06-28 ENCOUNTER — Ambulatory Visit
Admission: RE | Admit: 2018-06-28 | Discharge: 2018-06-28 | Disposition: A | Discharge status: Home / Self Care | Payer: Medicare Other | Source: Ambulatory Visit | Attending: Internal Medicine | Admitting: Internal Medicine

## 2018-06-28 DIAGNOSIS — Z1231 Encounter for screening mammogram for malignant neoplasm of breast: Secondary | ICD-10-CM

## 2018-07-02 DIAGNOSIS — R300 Dysuria: Secondary | ICD-10-CM | POA: Diagnosis not present

## 2018-07-17 ENCOUNTER — Other Ambulatory Visit: Payer: Self-pay | Admitting: Physician Assistant

## 2018-07-17 DIAGNOSIS — L570 Actinic keratosis: Secondary | ICD-10-CM | POA: Diagnosis not present

## 2018-07-17 DIAGNOSIS — D485 Neoplasm of uncertain behavior of skin: Secondary | ICD-10-CM | POA: Diagnosis not present

## 2018-07-19 DIAGNOSIS — M25561 Pain in right knee: Secondary | ICD-10-CM | POA: Diagnosis not present

## 2018-07-19 DIAGNOSIS — M25562 Pain in left knee: Secondary | ICD-10-CM | POA: Diagnosis not present

## 2018-07-19 DIAGNOSIS — M170 Bilateral primary osteoarthritis of knee: Secondary | ICD-10-CM | POA: Diagnosis not present

## 2018-08-13 DIAGNOSIS — D229 Melanocytic nevi, unspecified: Secondary | ICD-10-CM | POA: Diagnosis not present

## 2018-08-13 DIAGNOSIS — L570 Actinic keratosis: Secondary | ICD-10-CM | POA: Diagnosis not present

## 2018-08-21 DIAGNOSIS — H0014 Chalazion left upper eyelid: Secondary | ICD-10-CM | POA: Diagnosis not present

## 2018-09-02 DIAGNOSIS — L03213 Periorbital cellulitis: Secondary | ICD-10-CM | POA: Diagnosis not present

## 2018-09-05 DIAGNOSIS — R51 Headache: Secondary | ICD-10-CM | POA: Diagnosis not present

## 2018-09-05 DIAGNOSIS — L03213 Periorbital cellulitis: Secondary | ICD-10-CM | POA: Diagnosis not present

## 2018-09-17 DIAGNOSIS — G44019 Episodic cluster headache, not intractable: Secondary | ICD-10-CM | POA: Diagnosis not present

## 2018-09-17 DIAGNOSIS — R350 Frequency of micturition: Secondary | ICD-10-CM | POA: Diagnosis not present

## 2018-09-19 NOTE — Progress Notes (Signed)
Virtual Visit via Telephone Note   This visit type was conducted due to national recommendations for restrictions regarding the COVID-19 Pandemic (e.g. social distancing) in an effort to limit this patient's exposure and mitigate transmission in our community.  Due to her co-morbid illnesses, this patient is at least at moderate risk for complications without adequate follow up.  This format is felt to be most appropriate for this patient at this time.  The patient did not have access to video technology/had technical difficulties with video requiring transitioning to audio format only (telephone).  All issues noted in this document were discussed and addressed.  No physical exam could be performed with this format.  Please refer to the patient's chart for her  consent to telehealth for [facility].   Date:  09/20/2018   ID:  Kerri [name], DOB 1929-09-04, MRN [id number]  Patient Location: Home Provider Location: Home  PCP:  Lavone Orn, MD  Cardiologist:  Minus Breeding, MD  Electrophysiologist:  None   Evaluation Performed:  Follow-Up Visit  Chief Complaint:  Palpitations  History of Present Illness:    Kerri [name] is a 85 y.o. female with for evaluation of HTN.    Since I last saw her she has been bothered by cluster headaches and she is on a steroid taper.  She said her blood pressures been labile.  Systolics have been in the 140s to 110s.  She is had one diastolic reading of 413.  However, she sounds like she is doing relatively well.  She is not describing severe tachypalpitations.  She is had no chest pressure, neck or arm discomfort.  She has had no new shortness of breath, PND or orthopnea.  She is under stress because her husband is in a nursing home.  The patient does not have symptoms concerning for COVID-19 infection (fever, chills, cough, or new shortness of breath).    Past Medical History:  Diagnosis Date  . Adenomatous colon polyp   . Asthma   . CAD (coronary artery  disease)    Chest CTA 10/07: 40% or less pLAD;  ETT 8/09: negative  . Carotid bruit   . Chronic back pain   . Depression   . Diverticulosis   . DJD (degenerative joint disease)   . Dyslipidemia   . Dysrhythmia    pvcs  . Fatty liver   . GERD (gastroesophageal reflux disease)   . HTN (hypertension)   . Hypothyroidism   . IBS (irritable bowel syndrome)   . Internal hemorrhoid   . Melanoma ([place])    metastatic; left leg; s/p multiple excisions  . Migraine   . Osteoporosis   . Spinal stenosis    Past Surgical History:  Procedure Laterality Date  . BACK SURGERY    . BLEPHAROPLASTY    . BUNIONECTOMY WITH HAMMERTOE RECONSTRUCTION Right 01/16/2013   Procedure: RIGHT Barbie Banner OSTEOTOMY WITH SIMPLE BUNIONECTOMY AND SECOND HAMMER TOE CORRECTION;  Surgeon: Wylene Simmer, MD;  Location: [place];  Service: Orthopedics;  Laterality: Right;  . CARPAL TUNNEL RELEASE    . KNEE ARTHROSCOPY Right   . LUMBAR LAMINECTOMY/DECOMPRESSION MICRODISCECTOMY N/A 11/13/2013   Procedure: LUMBAR TWO-THREE,LUMBAR THREE-FOUR,LUMBAR FOUR-FIVE LAMINECTOMY AND FORAMINOTOMY;  Surgeon: Ophelia Charter, MD;  Location: [facility];  Service: Neurosurgery;  Laterality: N/A;  . MELANOMA EXCISION Left 09/19/2012   Procedure: WIDE EXCISION METASTATIC MELANOMA LEFT MEDIAL THIGH;  Surgeon: Zenovia Jarred, MD;  Location: [place];  Service: General;  Laterality: Left;  .  ROTATOR CUFF REPAIR    . SHOULDER SURGERY    . TOE SURGERY    . TOTAL HIP ARTHROPLASTY  09/04/2011   Procedure: TOTAL HIP ARTHROPLASTY;  Surgeon: Gearlean Alf, MD;  Location: [facility];  Service: Orthopedics;  Laterality: Left;     Current Meds  Medication Sig  . acidophilus (RISAQUAD) CAPS capsule Take 1 capsule by mouth daily.  . AMOXICILLIN PO Take by mouth. Take 4 one hour prior to dental procedures due to her mitral valve prolapse  . chlorthalidone (HYGROTON) 25 MG tablet Take 1 tablet (25 mg total) by mouth daily. Only  takes if bp over 841 systolic  . esomeprazole (NEXIUM) 20 MG capsule Take 20 mg by mouth daily at 12 noon.  . fluticasone (FLONASE) 50 MCG/ACT nasal spray SHAKE LQ AND U 2 SPRAYS IEN QD  . gabapentin (NEURONTIN) 300 MG capsule Take 300 mg by mouth 3 (three) times daily.  . hydroxypropyl methylcellulose (ISOPTO TEARS) 2.5 % ophthalmic solution Place 1 drop into both eyes 2 (two) times daily. Dry eyes  . levothyroxine (SYNTHROID, LEVOTHROID) 75 MCG tablet Take 75 mcg by mouth daily with breakfast.  . loratadine (CLARITIN) 10 MG tablet Take 10 mg by mouth daily.   . Magnesium 500 MG TABS Take 500 mg by mouth daily.  Marland Kitchen MELATONIN PO Take 1 tablet by mouth at bedtime.  . Polyethylene Glycol 3350 (MIRALAX PO) Take by mouth. Take 17 grams in 8 oz of water as needed for constipation.  . potassium chloride (K-DUR,KLOR-CON) 10 MEQ tablet Take 10 mEq by mouth daily as needed (Only takes when taking Chlorthalidone).   Marland Kitchen RETIN-A 0.1 % cream Apply 1 application topically every other day.   . rosuvastatin (CRESTOR) 10 MG tablet Take 10 mg by mouth at bedtime.   . traMADol (ULTRAM) 50 MG tablet Take 50 mg by mouth at bedtime.   Marland Kitchen VITAMIN D, CHOLECALCIFEROL, PO Take 1 tablet by mouth daily.     Allergies:   Buprenorphine hcl; Codeine; Morphine and related; Tramadol; Adhesive [tape]; Other; Hydrocodone-acetaminophen; Lactose intolerance (gi); and Oxycodone   Social History   Tobacco Use  . Smoking status: Former Smoker    Types: Cigarettes    Last attempt to quit: 08/31/1948    Years since quitting: 70.1  . Smokeless tobacco: Never Used  Substance Use Topics  . Alcohol use: Yes  . Drug use: No     Family Hx: The patient's family history includes CAD in her father and mother; Kidney disease in her child.  ROS:   Please see the history of present illness.    As stated in the HPI and negative for all other systems.   Prior CV studies:   The following studies were reviewed today:  None   Labs/Other Tests and Data Reviewed:    EKG:  No ECG reviewed.  Recent Labs: 10/25/2017: ALT 28; BUN 21; Creatinine, Ser 0.78; Hemoglobin 14.9; Platelets 194; Potassium 2.8; Sodium 141   Recent Lipid Panel No results found for: CHOL, TRIG, HDL, CHOLHDL, LDLCALC, LDLDIRECT  Wt Readings from Last 3 Encounters:  09/20/18 129 lb (58.5 kg)  03/05/18 127 lb (57.6 kg)  11/09/17 126 lb (57.2 kg)     Objective:    Vital Signs:  BP 113/85   Pulse 66   Ht 5\' 5"  (1.651 m)   Wt 129 lb (58.5 kg)   BMI 21.47 kg/m    NA  ASSESSMENT & PLAN:    HYPERTENSION  Her blood pressure  seems to be reasonably well controlled.  I would not suggest any change in medications.   PVCs She is not feeling these.    COVID-19 Education: The signs and symptoms of COVID-19 were discussed with the patient and how to seek care for testing (follow up with PCP or arrange E-visit).  The importance of social distancing was discussed today.  Time:   Today, I have spent 16 minutes with the patient with telehealth technology discussing the above problems.     Medication Adjustments/Labs and Tests Ordered: Current medicines are reviewed at length with the patient today.  Concerns regarding medicines are outlined above.   Tests Ordered: No orders of the defined types were placed in this encounter.   Medication Changes: No orders of the defined types were placed in this encounter.   Disposition:  Follow up with me in six months.   Signed, Minus Breeding, MD  09/20/2018 2:07 PM    [place]

## 2018-09-20 ENCOUNTER — Telehealth (INDEPENDENT_AMBULATORY_CARE_PROVIDER_SITE_OTHER): Payer: Medicare Other | Admitting: Cardiology

## 2018-09-20 ENCOUNTER — Encounter: Payer: Self-pay | Admitting: Cardiology

## 2018-09-20 VITALS — BP 113/85 | HR 66 | Ht 65.0 in | Wt 129.0 lb

## 2018-09-20 DIAGNOSIS — I10 Essential (primary) hypertension: Secondary | ICD-10-CM | POA: Diagnosis not present

## 2018-09-20 DIAGNOSIS — Z7189 Other specified counseling: Secondary | ICD-10-CM | POA: Diagnosis not present

## 2018-09-20 NOTE — Patient Instructions (Signed)

## 2018-09-30 ENCOUNTER — Telehealth: Payer: Self-pay | Admitting: Internal Medicine

## 2018-09-30 DIAGNOSIS — R351 Nocturia: Secondary | ICD-10-CM | POA: Diagnosis not present

## 2018-09-30 NOTE — Telephone Encounter (Signed)
Pt reports urine is "pouring out of her at night and she cannot get to the bathroom during the day." Discussed with pt that she needs to discuss this with her PCP. Pt verbalized understanding.

## 2018-09-30 NOTE — Telephone Encounter (Signed)
Pt stated that she has the constant urge to urinate.  She has tested negative for UTI.  Pt would like a call to discuss.

## 2018-10-01 DIAGNOSIS — R351 Nocturia: Secondary | ICD-10-CM | POA: Diagnosis not present

## 2018-10-03 ENCOUNTER — Other Ambulatory Visit: Payer: Self-pay

## 2018-10-03 ENCOUNTER — Ambulatory Visit: Payer: Medicare Other | Admitting: Internal Medicine

## 2018-10-03 DIAGNOSIS — L570 Actinic keratosis: Secondary | ICD-10-CM | POA: Diagnosis not present

## 2018-10-10 DIAGNOSIS — G44019 Episodic cluster headache, not intractable: Secondary | ICD-10-CM | POA: Diagnosis not present

## 2018-10-10 DIAGNOSIS — I10 Essential (primary) hypertension: Secondary | ICD-10-CM | POA: Diagnosis not present

## 2018-10-10 DIAGNOSIS — K219 Gastro-esophageal reflux disease without esophagitis: Secondary | ICD-10-CM | POA: Diagnosis not present

## 2018-10-10 DIAGNOSIS — M810 Age-related osteoporosis without current pathological fracture: Secondary | ICD-10-CM | POA: Diagnosis not present

## 2018-10-11 DIAGNOSIS — M79652 Pain in left thigh: Secondary | ICD-10-CM | POA: Diagnosis not present

## 2018-10-11 DIAGNOSIS — M170 Bilateral primary osteoarthritis of knee: Secondary | ICD-10-CM | POA: Diagnosis not present

## 2018-10-15 ENCOUNTER — Other Ambulatory Visit (HOSPITAL_COMMUNITY): Payer: Self-pay | Admitting: Orthopedic Surgery

## 2018-10-15 ENCOUNTER — Other Ambulatory Visit: Payer: Self-pay | Admitting: Orthopedic Surgery

## 2018-10-15 DIAGNOSIS — M79652 Pain in left thigh: Secondary | ICD-10-CM

## 2018-10-25 ENCOUNTER — Encounter (HOSPITAL_COMMUNITY)
Admission: RE | Admit: 2018-10-25 | Discharge: 2018-10-25 | Disposition: A | Discharge status: Home / Self Care | Payer: Medicare Other | Source: Ambulatory Visit | Attending: Orthopedic Surgery | Admitting: Orthopedic Surgery

## 2018-10-25 ENCOUNTER — Other Ambulatory Visit: Payer: Self-pay

## 2018-10-25 DIAGNOSIS — Z471 Aftercare following joint replacement surgery: Secondary | ICD-10-CM | POA: Diagnosis not present

## 2018-10-25 DIAGNOSIS — Z96642 Presence of left artificial hip joint: Secondary | ICD-10-CM | POA: Diagnosis not present

## 2018-10-25 DIAGNOSIS — M79652 Pain in left thigh: Secondary | ICD-10-CM | POA: Diagnosis not present

## 2018-10-25 MED ORDER — TECHNETIUM TC 99M MEDRONATE IV KIT
19.0000 | PACK | Freq: Once | INTRAVENOUS | Status: AC | PRN
  Administered 2018-10-25: 19 via INTRAVENOUS

## 2018-11-06 ENCOUNTER — Telehealth: Payer: Self-pay

## 2018-11-06 NOTE — Telephone Encounter (Signed)
   [facility] Pre-operative Risk Assessment    Request for surgical clearance:  1. What type of surgery is being performed? left hip femoral vs total hip revision   2. When is this surgery scheduled? 11/27/18   3. What type of clearance is required (medical clearance vs. Pharmacy clearance to hold med vs. Both)? medical  4. Are there any medications that need to be held prior to surgery and how long? n/a   5. Practice name and name of physician performing surgery? [facility]  Dr Pilar Plate Aluisio   6. What is your office phone number? [phone]    7.   What is your office fax number? [place]  8.   Anesthesia type (None, local, MAC, general)? choice   [place], [place] 11/06/2018, 5:12 PM  _________________________________________________________________   (provider comments below)

## 2018-11-08 NOTE — Telephone Encounter (Signed)
   Primary Cardiologist: Minus Breeding, MD  Chart reviewed as part of pre-operative protocol coverage. Patient was contacted 11/08/2018 in reference to pre-operative risk assessment for pending surgery as outlined below.  Kerri [name] was last seen on 09/20/2018 by Dr. Percival Spanish.  Since that day, Kerri [name] has done well. She has no history of known CAD, stroke, renal dysfunction or diabetes. She is quite active for her age, doing all of her own housework or yardwork. She has been limited in her walking and dancing for the last 2 years since her fall with femur fracture but she still walks as much as she can and has no chest discomfort or shortness of breath. She is probably able to complete at least 4 Mets of activity. She is at slight increased surgical risk due to age.   Therefore, based on ACC/AHA guidelines, the patient would be at acceptable risk for the planned procedure without further cardiovascular testing.   I will route this recommendation to the requesting party via Epic fax function and remove from pre-op pool.  Please call with questions.  Daune Perch, NP 11/08/2018, 2:13 PM

## 2018-11-12 DIAGNOSIS — T84031D Mechanical loosening of internal left hip prosthetic joint, subsequent encounter: Secondary | ICD-10-CM | POA: Diagnosis not present

## 2018-11-12 DIAGNOSIS — Z96642 Presence of left artificial hip joint: Secondary | ICD-10-CM | POA: Diagnosis not present

## 2018-11-13 DIAGNOSIS — L570 Actinic keratosis: Secondary | ICD-10-CM | POA: Diagnosis not present

## 2018-11-13 NOTE — H&P (Signed)
TOTAL HIP REVISION ADMISSION H&P  Patient is admitted for left hip femoral versus total hip arthroplasty revision.  Subjective:  Chief Complaint: left hip pain  HPI: Kerri [name] is an 85 year old female who presents for pre-operative visit in preparation for their left hip femoral vs. THA revision , which is scheduled on 11-27-2018 with Dr. Wynelle Link at [facility]. The patient has had symptoms in the left hip including persistent thigh pain which has impacted their quality of life and ability to do activities of daily living. Recent bone scan on 10/25/2018 of the left hip showed loosening of the femoral component. The patient currently has a diagnosis of failed left total hip arthroplasty and has failed conservative treatments including activity modification. The patient denies an active infection.   Patient Active Problem List   Diagnosis Date Noted  . Melanoma ([place])   . Dizziness 01/30/2017  . Lumbar stenosis with neurogenic claudication 11/13/2013  . Sebaceous cyst 11/20/2012  . Squamous cell carcinoma in situ of skin of calf 04/03/2012  . Postop Hypokalemia 09/08/2011  . Postop Acute blood loss anemia 09/05/2011  . Osteoarthritis of hip 09/04/2011  . Melanoma of lower leg ([place]) 05/03/2011  . PVC's (premature ventricular contractions) 07/30/2009  . HYPOTHYROIDISM 05/10/2009  . DYSLIPIDEMIA 05/10/2009  . Essential hypertension 05/10/2009  . CAD 05/10/2009  . DEGENERATIVE JOINT DISEASE 05/10/2009  . SPINAL STENOSIS 05/10/2009  . CAROTID BRUIT 05/10/2009   Past Medical History:  Diagnosis Date  . Adenomatous colon polyp   . Asthma   . CAD (coronary artery disease)    Chest CTA 10/07: 40% or less pLAD;  ETT 8/09: negative  . Carotid bruit   . Chronic back pain   . Depression   . Diverticulosis   . DJD (degenerative joint disease)   . Dyslipidemia   . Dysrhythmia    pvcs  . Fatty liver   . GERD (gastroesophageal reflux disease)   . HTN (hypertension)   .  Hypothyroidism   . IBS (irritable bowel syndrome)   . Internal hemorrhoid   . Melanoma ([place])    metastatic; left leg; s/p multiple excisions  . Migraine   . Osteoporosis   . Spinal stenosis     Past Surgical History:  Procedure Laterality Date  . BACK SURGERY    . BLEPHAROPLASTY    . BUNIONECTOMY WITH HAMMERTOE RECONSTRUCTION Right 01/16/2013   Procedure: RIGHT Barbie Banner OSTEOTOMY WITH SIMPLE BUNIONECTOMY AND SECOND HAMMER TOE CORRECTION;  Surgeon: Wylene Simmer, MD;  Location: [place];  Service: Orthopedics;  Laterality: Right;  . CARPAL TUNNEL RELEASE    . KNEE ARTHROSCOPY Right   . LUMBAR LAMINECTOMY/DECOMPRESSION MICRODISCECTOMY N/A 11/13/2013   Procedure: LUMBAR TWO-THREE,LUMBAR THREE-FOUR,LUMBAR FOUR-FIVE LAMINECTOMY AND FORAMINOTOMY;  Surgeon: Ophelia Charter, MD;  Location: [facility];  Service: Neurosurgery;  Laterality: N/A;  . MELANOMA EXCISION Left 09/19/2012   Procedure: WIDE EXCISION METASTATIC MELANOMA LEFT MEDIAL THIGH;  Surgeon: Zenovia Jarred, MD;  Location: [place];  Service: General;  Laterality: Left;  . ROTATOR CUFF REPAIR    . SHOULDER SURGERY    . TOE SURGERY    . TOTAL HIP ARTHROPLASTY  09/04/2011   Procedure: TOTAL HIP ARTHROPLASTY;  Surgeon: Gearlean Alf, MD;  Location: [facility];  Service: Orthopedics;  Laterality: Left;    No current facility-administered medications for this encounter.    Current Outpatient Medications  Medication Sig Dispense Refill Last Dose  . acidophilus (RISAQUAD) CAPS capsule Take 1  capsule by mouth daily.   Taking  . AMOXICILLIN PO Take by mouth. Take 4 one hour prior to dental procedures due to her mitral valve prolapse   Taking  . chlorthalidone (HYGROTON) 25 MG tablet Take 1 tablet (25 mg total) by mouth daily. Only takes if bp over 570 systolic 90 tablet 3 Taking  . esomeprazole (NEXIUM) 20 MG capsule Take 20 mg by mouth daily at 12 noon.   Taking  . fluticasone (FLONASE) 50 MCG/ACT nasal spray  SHAKE LQ AND U 2 SPRAYS IEN QD  5 Taking  . gabapentin (NEURONTIN) 300 MG capsule Take 300 mg by mouth 3 (three) times daily.   Taking  . hydroxypropyl methylcellulose (ISOPTO TEARS) 2.5 % ophthalmic solution Place 1 drop into both eyes 2 (two) times daily. Dry eyes   Taking  . levothyroxine (SYNTHROID, LEVOTHROID) 75 MCG tablet Take 75 mcg by mouth daily with breakfast.   Taking  . loratadine (CLARITIN) 10 MG tablet Take 10 mg by mouth daily.    Taking  . Magnesium 500 MG TABS Take 500 mg by mouth daily.   Taking  . MELATONIN PO Take 1 tablet by mouth at bedtime.   Taking  . Polyethylene Glycol 3350 (MIRALAX PO) Take by mouth. Take 17 grams in 8 oz of water as needed for constipation.   Taking  . potassium chloride (K-DUR,KLOR-CON) 10 MEQ tablet Take 10 mEq by mouth daily as needed (Only takes when taking Chlorthalidone).   12 Taking  . predniSONE (DELTASONE) 20 MG tablet TK 3 TS PO QD FOR 5 DAYS     . RETIN-A 0.1 % cream Apply 1 application topically every other day.    Taking  . rosuvastatin (CRESTOR) 10 MG tablet Take 10 mg by mouth at bedtime.    Taking  . traMADol (ULTRAM) 50 MG tablet Take 50 mg by mouth at bedtime.    Taking  . VITAMIN D, CHOLECALCIFEROL, PO Take 1 tablet by mouth daily.   Taking   Allergies  Allergen Reactions  . Buprenorphine Hcl Itching    Happened a while back. Patient noted that any narcotic based medication makes her have a bad reaction.  . Codeine Nausea And Vomiting    Patient gets sick on stomach  . Morphine And Related Itching    Happened a while back. Patient noted that any narcotic based medication makes her have a bad reaction.  . Tramadol Nausea And Vomiting and Other (See Comments)    Confusion Can tolerate one tablet at night  . Adhesive [Tape] Other (See Comments)    Tears skin   . Other Other (See Comments)  . Hydrocodone-Acetaminophen Nausea And Vomiting  . Lactose Intolerance (Gi) Other (See Comments)    Gi upset   . Oxycodone Nausea And  Vomiting    Social History   Tobacco Use  . Smoking status: Former Smoker    Types: Cigarettes    Quit date: 08/31/1948    Years since quitting: 70.2  . Smokeless tobacco: Never Used  Substance Use Topics  . Alcohol use: Yes    Family History  Problem Relation Age of Onset  . CAD Mother   . CAD Father   . Kidney disease Child       Review of Systems  Constitutional: Negative for chills and fever.  HENT: Negative for congestion, sore throat and tinnitus.   Eyes: Negative for double vision, photophobia and pain.  Respiratory: Negative for cough, shortness of breath and wheezing.  Cardiovascular: Negative for chest pain, palpitations and orthopnea.  Gastrointestinal: Negative for heartburn, nausea and vomiting.  Genitourinary: Negative for dysuria, frequency and urgency.  Musculoskeletal: Positive for joint pain.  Neurological: Negative for dizziness, weakness and headaches.    Objective:  Physical Exam  Well nourished and well developed.  General: Alert and oriented x3, cooperative and pleasant, no acute distress.  Head: normocephalic, atraumatic, neck supple.  Eyes: EOMI.  Respiratory: breath sounds clear in all fields, no wheezing, rales, or rhonchi. Cardiovascular: Regular rate and rhythm, no murmurs, gallops or rubs.  Abdomen: non-tender to palpation and soft, normoactive bowel sounds. Musculoskeletal:  Left Hip Exam: She has pain during range of motion of the left hip. She is significantly tender over the tip of the stem, this is the area where the bone scan has increased activity.   Calves soft and nontender. Motor function intact in LE. Strength 5/5 LE bilaterally. Neuro: Distal pulses 2+. Sensation to light touch intact in LE.  Vital signs in last 24 hours: Blood pressure: 128/88 mmHg Pulse: 76 bpm   Labs:   Estimated body mass index is 21.47 kg/m as calculated from the following:   Height as of 09/20/18: 5\' 5"  (1.651 m).   Weight as of 09/20/18: 58.5  kg.  Imaging Review: 3 phase nuclear bone scan of the left hip showed increased uptake at the femoral stem, indicating loosening.   Assessment/Plan:  Failed left total hip arthroplasty  The patient history, physical examination, clinical judgement of the provider and imaging studies are consistent with failed left total hip arthroplasty with femoral loosing. Revision total hip arthroplasty is deemed medically necessary. She has an S-ROM prosthesis in place. We could potentially solve this problem by revising her to a longer stem or by (potentially) changing the entire stem. We will make this decision intraoperatively.The treatment options including medical management, injection therapy, arthroscopy and arthroplasty were discussed at length. The risks and benefits of total hip arthroplasty were presented and reviewed. The risks due to aseptic loosening, infection, stiffness, dislocation/subluxation,  thromboembolic complications and other imponderables were discussed.  The patient acknowledged the explanation, agreed to proceed with the plan and consent was signed. Patient is being admitted for inpatient treatment for surgery, pain control, PT, OT, prophylactic antibiotics, VTE prophylaxis, progressive ambulation and ADL's and discharge planning. The patient is planning to be discharged home with home health services.  Therapy Plans: HHPT Disposition: Home with daughter Planned DVT Prophylaxis: Xarelto 10 mg daily (hx of metastatic melanoma) DME needed: None PCP: Lavone Orn, MD TXA: IV Allergies: Adhesive tape (skin tears), morphine (nausea/vomiting), tramadol (hypotension/dizziness) Anesthesia Concerns: None BMI: 23.1  - Patient was instructed on what medications to stop prior to surgery. - Follow-up visit in 2 weeks with Dr. Wynelle Link - Begin physical therapy following surgery - Pre-operative lab work as pre-surgical testing - Prescriptions will be provided in hospital at time of discharge   Theresa Duty, PA-C Orthopedic Surgery [facility] [place]

## 2018-11-14 DIAGNOSIS — H01004 Unspecified blepharitis left upper eyelid: Secondary | ICD-10-CM | POA: Diagnosis not present

## 2018-11-14 DIAGNOSIS — H01002 Unspecified blepharitis right lower eyelid: Secondary | ICD-10-CM | POA: Diagnosis not present

## 2018-11-14 DIAGNOSIS — H01001 Unspecified blepharitis right upper eyelid: Secondary | ICD-10-CM | POA: Diagnosis not present

## 2018-11-14 DIAGNOSIS — H01005 Unspecified blepharitis left lower eyelid: Secondary | ICD-10-CM | POA: Diagnosis not present

## 2018-11-20 NOTE — Patient Instructions (Addendum)
YOU NEED TO HAVE A COVID 19 TEST ON_Monday 7/6______ @_1 :15______, THIS TEST MUST BE DONE BEFORE SURGERY, COME TO [place] ENTRANCE. ONCE YOUR COVID TEST IS COMPLETED, PLEASE BEGIN THE QUARANTINE INSTRUCTIONS AS OUTLINED IN YOUR HANDOUT.                Kerri [name]   Your procedure is scheduled on: 11-27-2018   Report to [facility] Main  Entrance  Report to admitting at 3:55 PM      Call this number if you have problems the morning of surgery [phone]    Remember:  [place], NO [place].   NO SOLID FOOD AFTER MIDNIGHT THE NIGHT PRIOR TO SURGERY.  NOTHING BY MOUTH EXCEPT CLEAR LIQUIDS UNTIL 3:25 PM.  PLEASE FINISH ENSURE DRINK PER SURGEON ORDER 3 HOURS PRIOR TO SCHEDULED SURGERY TIME WHICH NEEDS TO BE COMPLETED AT  3:25 PM.   CLEAR LIQUID DIET   Foods Allowed                                                                     Foods Excluded  Coffee and tea, regular and decaf                             liquids that you cannot  Plain Jell-O in any flavor                                             see through such as: Fruit ices (not with fruit pulp)                                     milk, soups, orange juice  Iced Popsicles                                    All solid food Carbonated beverages, regular and diet                                    Cranberry, grape and apple juices Sports drinks like Gatorade Lightly seasoned clear broth or consume(fat free) Sugar, honey syrup  Sample Menu Breakfast                                Lunch                                     Supper Cranberry juice                    Beef broth  Chicken broth Jell-O                                     Grape juice                           Apple juice Coffee or tea                        Jell-O                                      Popsicle                                                 Coffee or tea                        Coffee or tea  _____________________________________________________________________     Take these medicines the morning of surgery with A SIP OF WATER: Gabapentin, Claritin,Flonase, Synthroid                               You may not have any metal on your body including hair pins and              piercings           Do not wear jewelry, make-up, lotions, powders or perfumes, deodorant             Do not wear nail polish.  Do not shave  48 hours prior to surgery.                Do not bring valuables to the hospital. [place].  Contacts, dentures or bridgework may not be worn into surgery.      _____________________________________________________________________ [facility] - Preparing for Surgery  Before surgery, you can play an important role.   Because skin is not sterile, your skin needs to be as free of germs as possible .  You can reduce the number of germs on your skin by washing with CHG (chlorahexidine gluconate) soap before surgery.   CHG is an antiseptic cleaner which kills germs and bonds with the skin to continue killing germs even after washing. Please DO NOT use if you have an allergy to CHG or antibacterial soaps .  If your skin becomes reddened/irritated stop using the CHG and inform your nurse when you arrive at Short Stay. Do not shave (including legs and underarms) for at least 48 hours prior to the first CHG shower.. Please follow these instructions carefully:  1.  Shower with CHG Soap the night before surgery and the  morning of Surgery.  2.  If you choose to wash your hair, wash your hair first as usual with your  normal  shampoo.  3.  After you shampoo, rinse your hair and body thoroughly to remove the  shampoo.  4.  Use CHG as you would any other liquid soap.  You can apply chg directly  to the skin and wash                        Gently with a scrungie or clean washcloth.  5.  Apply the CHG Soap to your body ONLY FROM THE NECK DOWN.   Do not use on face/ open                           Wound or open sores. Avoid contact with eyes, ears mouth and genitals (private parts).                       Wash face,  Genitals (private parts) with your normal soap.             6.  Wash thoroughly, paying special attention to the area where your surgery  will be performed.  7.  Thoroughly rinse your body with warm water from the neck down.  8.  DO NOT shower/wash with your normal soap after using and rinsing off  the CHG Soap.             9.  Pat yourself dry with a clean towel.            10.  Wear clean pajamas.            11.  Place clean sheets on your bed the night of your first shower and do not  sleep with pets.  Day of Surgery : Do not apply any lotions/deodorants the morning of surgery.  Please wear clean clothes to the hospital/surgery center.   FAILURE TO FOLLOW THESE INSTRUCTIONS MAY RESULT IN THE CANCELLATION OF YOUR SURGERY PATIENT SIGNATURE_________________________________  NURSE SIGNATURE__________________________________  ________________________________________________________________________   Kerri [name]  An incentive spirometer is a tool that can help keep your lungs clear and active. This tool measures how well you are filling your lungs with each breath. Taking long deep breaths may help reverse or decrease the chance of developing breathing (pulmonary) problems (especially infection) following:  A long period of time when you are unable to move or be active. BEFORE THE PROCEDURE   If the spirometer includes an indicator to show your best effort, your nurse or respiratory therapist will set it to a desired goal.  If possible, sit up straight or lean slightly forward. Try not to slouch.  Hold the incentive spirometer in an upright position. INSTRUCTIONS FOR USE  1. Sit on  the edge of your bed if possible, or sit up as far as you can in bed or on a chair. 2. Hold the incentive spirometer in an upright position. 3. Breathe out normally. 4. Place the mouthpiece in your mouth and seal your lips tightly around it. 5. Breathe in slowly and as deeply as possible, raising the piston or the ball toward the top of the column. 6. Hold your breath for 3-5 seconds or for as long as possible. Allow the piston or ball to fall to the bottom of the column. 7. Remove the mouthpiece from your mouth and breathe out normally. 8. Rest for a few seconds and repeat Steps 1 through 7 at least 10 times every 1-2 hours when you are awake. Take your time and take a few normal breaths between deep breaths. 9. The spirometer may include an indicator to show  your best effort. Use the indicator as a goal to work toward during each repetition. 10. After each set of 10 deep breaths, practice coughing to be sure your lungs are clear. If you have an incision (the cut made at the time of surgery), support your incision when coughing by placing a pillow or rolled up towels firmly against it. Once you are able to get out of bed, walk around indoors and cough well. You may stop using the incentive spirometer when instructed by your caregiver.  RISKS AND COMPLICATIONS  Take your time so you do not get dizzy or light-headed.  If you are in pain, you may need to take or ask for pain medication before doing incentive spirometry. It is harder to take a deep breath if you are having pain. AFTER USE  Rest and breathe slowly and easily.  It can be helpful to keep track of a log of your progress. Your caregiver can provide you with a simple table to help with this. If you are using the spirometer at home, follow these instructions: [place] IF:   You are having difficultly using the spirometer.  You have trouble using the spirometer as often as instructed.  Your pain medication is not giving  enough relief while using the spirometer.  You develop fever of 100.5 F (38.1 C) or higher. SEEK IMMEDIATE MEDICAL CARE IF:   You cough up bloody sputum that had not been present before.  You develop fever of 102 F (38.9 C) or greater.  You develop worsening pain at or near the incision site. MAKE SURE YOU:   Understand these instructions.  Will watch your condition.  Will get help right away if you are not doing well or get worse. Document Released: 09/18/2006 Document Revised: 07/31/2011 Document Reviewed: 11/19/2006 Exit[employer] Patient Information 2014 [employer], [place].   ________________________________________________________________________  WHAT IS A BLOOD TRANSFUSION? Blood Transfusion Information  A transfusion is the replacement of blood or some of its parts. Blood is made up of multiple cells which provide different functions.  Red blood cells carry oxygen and are used for blood loss replacement.  White blood cells fight against infection.  Platelets control bleeding.  Plasma helps clot blood.  Other blood products are available for specialized needs, such as hemophilia or other clotting disorders. BEFORE THE TRANSFUSION  Who gives blood for transfusions?   Healthy volunteers who are fully evaluated to make sure their blood is safe. This is blood bank blood. Transfusion therapy is the safest it has ever been in the practice of medicine. Before blood is taken from a donor, a complete history is taken to make sure that person has no history of diseases nor engages in risky social behavior (examples are intravenous drug use or sexual activity with multiple partners). The donor's travel history is screened to minimize risk of transmitting infections, such as malaria. The donated blood is tested for signs of infectious diseases, such as HIV and hepatitis. The blood is then tested to be sure it is compatible with you in order to minimize the chance of a transfusion  reaction. If you or a relative donates blood, this is often done in anticipation of surgery and is not appropriate for emergency situations. It takes many days to process the donated blood. RISKS AND COMPLICATIONS Although transfusion therapy is very safe and saves many lives, the main dangers of transfusion include:   Getting an infectious disease.  Developing a transfusion reaction. This is an allergic reaction to  something in the blood you were given. Every precaution is taken to prevent this. The decision to have a blood transfusion has been considered carefully by your caregiver before blood is given. Blood is not given unless the benefits outweigh the risks. AFTER THE TRANSFUSION  Right after receiving a blood transfusion, you will usually feel much better and more energetic. This is especially true if your red blood cells have gotten low (anemic). The transfusion raises the level of the red blood cells which carry oxygen, and this usually causes an energy increase.  The nurse administering the transfusion will monitor you carefully for complications. HOME CARE INSTRUCTIONS  No special instructions are needed after a transfusion. You may find your energy is better. Speak with your caregiver about any limitations on activity for underlying diseases you may have. SEEK MEDICAL CARE IF:   Your condition is not improving after your transfusion.  You develop redness or irritation at the intravenous (IV) site. SEEK IMMEDIATE MEDICAL CARE IF:  Any of the following symptoms occur over the next 12 hours:  Shaking chills.  You have a temperature by mouth above 102 F (38.9 C), not controlled by medicine.  Chest, back, or muscle pain.  People around you feel you are not acting correctly or are confused.  Shortness of breath or difficulty breathing.  Dizziness and fainting.  You get a rash or develop hives.  You have a decrease in urine output.  Your urine turns a dark color or  changes to pink, red, or brown. Any of the following symptoms occur over the next 10 days:  You have a temperature by mouth above 102 F (38.9 C), not controlled by medicine.  Shortness of breath.  Weakness after normal activity.  The white part of the eye turns yellow (jaundice).  You have a decrease in the amount of urine or are urinating less often.  Your urine turns a dark color or changes to pink, red, or brown. Document Released: 05/05/2000 Document Revised: 07/31/2011 Document Reviewed: 12/23/2007 [facility] Patient Information 2014 [place], [place].  _______________________________________________________________________

## 2018-11-21 ENCOUNTER — Other Ambulatory Visit: Payer: Self-pay

## 2018-11-21 ENCOUNTER — Encounter (HOSPITAL_COMMUNITY): Payer: Self-pay

## 2018-11-21 ENCOUNTER — Encounter (HOSPITAL_COMMUNITY)
Admission: RE | Admit: 2018-11-21 | Discharge: 2018-11-21 | Disposition: A | Discharge status: Home / Self Care | Payer: Medicare Other | Source: Ambulatory Visit | Attending: Orthopedic Surgery | Admitting: Orthopedic Surgery

## 2018-11-21 DIAGNOSIS — F329 Major depressive disorder, single episode, unspecified: Secondary | ICD-10-CM | POA: Diagnosis not present

## 2018-11-21 DIAGNOSIS — Z7951 Long term (current) use of inhaled steroids: Secondary | ICD-10-CM | POA: Insufficient documentation

## 2018-11-21 DIAGNOSIS — E785 Hyperlipidemia, unspecified: Secondary | ICD-10-CM | POA: Insufficient documentation

## 2018-11-21 DIAGNOSIS — I2510 Atherosclerotic heart disease of native coronary artery without angina pectoris: Secondary | ICD-10-CM | POA: Diagnosis not present

## 2018-11-21 DIAGNOSIS — Z01818 Encounter for other preprocedural examination: Secondary | ICD-10-CM | POA: Insufficient documentation

## 2018-11-21 DIAGNOSIS — G43909 Migraine, unspecified, not intractable, without status migrainosus: Secondary | ICD-10-CM | POA: Insufficient documentation

## 2018-11-21 DIAGNOSIS — R9431 Abnormal electrocardiogram [ECG] [EKG]: Secondary | ICD-10-CM | POA: Insufficient documentation

## 2018-11-21 DIAGNOSIS — Z79890 Hormone replacement therapy (postmenopausal): Secondary | ICD-10-CM | POA: Insufficient documentation

## 2018-11-21 DIAGNOSIS — E039 Hypothyroidism, unspecified: Secondary | ICD-10-CM | POA: Diagnosis not present

## 2018-11-21 DIAGNOSIS — J45909 Unspecified asthma, uncomplicated: Secondary | ICD-10-CM | POA: Diagnosis not present

## 2018-11-21 DIAGNOSIS — X58XXXA Exposure to other specified factors, initial encounter: Secondary | ICD-10-CM | POA: Insufficient documentation

## 2018-11-21 DIAGNOSIS — K760 Fatty (change of) liver, not elsewhere classified: Secondary | ICD-10-CM | POA: Insufficient documentation

## 2018-11-21 DIAGNOSIS — T84091A Other mechanical complication of internal left hip prosthesis, initial encounter: Secondary | ICD-10-CM | POA: Diagnosis not present

## 2018-11-21 DIAGNOSIS — Z85820 Personal history of malignant melanoma of skin: Secondary | ICD-10-CM | POA: Insufficient documentation

## 2018-11-21 DIAGNOSIS — K219 Gastro-esophageal reflux disease without esophagitis: Secondary | ICD-10-CM | POA: Insufficient documentation

## 2018-11-21 DIAGNOSIS — Z8673 Personal history of transient ischemic attack (TIA), and cerebral infarction without residual deficits: Secondary | ICD-10-CM | POA: Diagnosis not present

## 2018-11-21 DIAGNOSIS — Z87891 Personal history of nicotine dependence: Secondary | ICD-10-CM | POA: Insufficient documentation

## 2018-11-21 DIAGNOSIS — Z79899 Other long term (current) drug therapy: Secondary | ICD-10-CM | POA: Insufficient documentation

## 2018-11-21 DIAGNOSIS — I10 Essential (primary) hypertension: Secondary | ICD-10-CM | POA: Diagnosis not present

## 2018-11-21 LAB — CBC
HCT: 42.9 % (ref 36.0–46.0)
Hemoglobin: 14 g/dL (ref 12.0–15.0)
MCH: 32.2 pg (ref 26.0–34.0)
MCHC: 32.6 g/dL (ref 30.0–36.0)
MCV: 98.6 fL (ref 80.0–100.0)
Platelets: 194 10*3/uL (ref 150–400)
RBC: 4.35 MIL/uL (ref 3.87–5.11)
RDW: 12 % (ref 11.5–15.5)
WBC: 6.5 10*3/uL (ref 4.0–10.5)
nRBC: 0 % (ref 0.0–0.2)

## 2018-11-21 LAB — COMPREHENSIVE METABOLIC PANEL
ALT: 19 U/L (ref 0–44)
AST: 21 U/L (ref 15–41)
Albumin: 4.2 g/dL (ref 3.5–5.0)
Alkaline Phosphatase: 58 U/L (ref 38–126)
Anion gap: 9 (ref 5–15)
BUN: 17 mg/dL (ref 8–23)
CO2: 30 mmol/L (ref 22–32)
Calcium: 9.4 mg/dL (ref 8.9–10.3)
Chloride: 102 mmol/L (ref 98–111)
Creatinine, Ser: 0.69 mg/dL (ref 0.44–1.00)
GFR calc Af Amer: >60 mL/min (ref >60)
GFR calc non Af Amer: >60 mL/min (ref >60)
Glucose, Bld: 107 mg/dL — ABNORMAL HIGH (ref 70–99)
Potassium: 3.1 mmol/L — ABNORMAL LOW (ref 3.5–5.1)
Sodium: 141 mmol/L (ref 135–145)
Total Bilirubin: 0.4 mg/dL (ref 0.3–1.2)
Total Protein: 7.1 g/dL (ref 6.5–8.1)

## 2018-11-21 LAB — SURGICAL PCR SCREEN
MRSA, PCR: NEGATIVE
Staphylococcus aureus: NEGATIVE

## 2018-11-21 LAB — APTT: aPTT: 32 seconds (ref 24–36)

## 2018-11-21 LAB — PROTIME-INR
INR: 1 (ref 0.8–1.2)
Prothrombin Time: 12.9 seconds (ref 11.4–15.2)

## 2018-11-25 ENCOUNTER — Other Ambulatory Visit (HOSPITAL_COMMUNITY)
Admission: RE | Admit: 2018-11-25 | Discharge: 2018-11-25 | Disposition: A | Discharge status: Home / Self Care | Payer: Medicare Other | Source: Ambulatory Visit | Attending: Orthopedic Surgery | Admitting: Orthopedic Surgery

## 2018-11-25 DIAGNOSIS — Z1159 Encounter for screening for other viral diseases: Secondary | ICD-10-CM | POA: Insufficient documentation

## 2018-11-25 DIAGNOSIS — Z01812 Encounter for preprocedural laboratory examination: Secondary | ICD-10-CM | POA: Diagnosis not present

## 2018-11-25 NOTE — Progress Notes (Signed)
Anesthesia Chart Review   Case: [id number] Date/Time: 11/27/18 1810   Procedure: Left hip femoral vs total hip arthroplasty revision (Left ) - 158min   Anesthesia type: Choice   Pre-op diagnosis: failed left total hip arthroplasty   Location: [facility] 09 / [facility]   Surgeon: Gaynelle Arabian, MD      DISCUSSION:85 y.o. former smoker (quit 08/31/48) with h/o nonobstructive CAD, CVA, HTN, hypothyroidism, GERD, depression, migraines, PVCs, failed left total hip arthroplasty scheduled for above procedure 11/27/2018 with Dr. Gaynelle Arabian.   Pt cleared by cardiology.  Per Daune Perch, NP, "Kerri [name] was last seen on 09/20/2018 by Dr. Percival Spanish.  Since that day, Kerri [name] has done well. She has no history of known CAD, stroke, renal dysfunction or diabetes. She is quite active for her age, doing all of her own housework or yardwork. She has been limited in her walking and dancing for the last 2 years since her fall with femur fracture but she still walks as much as she can and has no chest discomfort or shortness of breath. She is probably able to complete at least 4 Mets of activity. She is at slight increased surgical risk due to age. Therefore, based on ACC/AHA guidelines, the patient would be at acceptable risk for the planned procedure without further cardiovascular testing."  Anticipate pt can proceed with planned procedure barring acute status change.   VS: BP (!) 155/73   Pulse 65   Temp 36.7 C (Oral)   Resp 20   Ht 5\' 5"  (1.651 m)   Wt 58 kg   SpO2 98%   BMI 21.27 kg/m   PROVIDERS: Lavone Orn, MD is PCP   Minus Breeding, MD is Cardiologist  LABS: Labs reviewed: Acceptable for surgery. (all labs ordered are listed, but only abnormal results are displayed)  Labs Reviewed  COMPREHENSIVE METABOLIC PANEL - Abnormal; Notable for the following components:      Result Value   Potassium 3.1 (*)    Glucose, Bld 107 (*)    All other components within normal limits  SURGICAL PCR SCREEN   APTT  CBC  PROTIME-INR  TYPE AND SCREEN     IMAGES:   EKG: 11/21/2018 Rate 62 bpm Sinus rhythm with occasional Premature ventricular complexes Cannot rule out Anterior infarct , age undetermined Abnormal ECG No significant change since last tracing  CV:  Past Medical History:  Diagnosis Date  . Adenomatous colon polyp   . Asthma   . CAD (coronary artery disease)    Chest CTA 10/07: 40% or less pLAD;  ETT 8/09: negative  . Carotid bruit   . Chronic back pain   . Depression   . Diverticulosis   . DJD (degenerative joint disease)   . Dyslipidemia   . Dysrhythmia    pvcs  . Fatty liver   . GERD (gastroesophageal reflux disease)   . HTN (hypertension)   . Hypothyroidism   . IBS (irritable bowel syndrome)   . Internal hemorrhoid   . Melanoma ([place])    metastatic; left leg; s/p multiple excisions  . Migraine   . Osteoporosis   . Spinal stenosis     Past Surgical History:  Procedure Laterality Date  . BACK SURGERY    . BLEPHAROPLASTY    . BUNIONECTOMY WITH HAMMERTOE RECONSTRUCTION Right 01/16/2013   Procedure: RIGHT Barbie Banner OSTEOTOMY WITH SIMPLE BUNIONECTOMY AND SECOND HAMMER TOE CORRECTION;  Surgeon: Wylene Simmer, MD;  Location: [facility];  Service: Orthopedics;  Laterality: Right;  .  CARPAL TUNNEL RELEASE    . KNEE ARTHROSCOPY Right   . LUMBAR LAMINECTOMY/DECOMPRESSION MICRODISCECTOMY N/A 11/13/2013   Procedure: LUMBAR TWO-THREE,LUMBAR THREE-FOUR,LUMBAR FOUR-FIVE LAMINECTOMY AND FORAMINOTOMY;  Surgeon: Ophelia Charter, MD;  Location: [facility];  Service: Neurosurgery;  Laterality: N/A;  . MELANOMA EXCISION Left 09/19/2012   Procedure: WIDE EXCISION METASTATIC MELANOMA LEFT MEDIAL THIGH;  Surgeon: Zenovia Jarred, MD;  Location: [facility];  Service: General;  Laterality: Left;  . ROTATOR CUFF REPAIR    . SHOULDER SURGERY    . TOE SURGERY    . TOTAL HIP ARTHROPLASTY  09/04/2011   Procedure: TOTAL HIP ARTHROPLASTY;  Surgeon: Gearlean Alf, MD;  Location: [facility];  Service: Orthopedics;  Laterality: Left;    MEDICATIONS: . acetaminophen (TYLENOL) 500 MG tablet  . Apoaequorin (PREVAGEN EXTRA STRENGTH) 20 MG CAPS  . Biotin 5 MG CAPS  . calcium carbonate (TUMS - DOSED IN MG ELEMENTAL CALCIUM) 500 MG chewable tablet  . chlorthalidone (HYGROTON) 25 MG tablet  . Cholecalciferol (VITAMIN D3) 50 MCG (2000 UT) TABS  . esomeprazole (NEXIUM) 20 MG capsule  . fluticasone (FLONASE) 50 MCG/ACT nasal spray  . gabapentin (NEURONTIN) 300 MG capsule  . levothyroxine (SYNTHROID, LEVOTHROID) 75 MCG tablet  . lidocaine (LIDODERM) 5 %  . loratadine (CLARITIN) 10 MG tablet  . Magnesium 250 MG TABS  . Melatonin 10 MG CAPS  . polyethylene glycol powder (MIRALAX) 17 GM/SCOOP powder  . potassium chloride (K-DUR,KLOR-CON) 10 MEQ tablet  . Probiotic Product (PROBIOTIC PO)  . Propylene Glycol (SYSTANE BALANCE) 0.6 % SOLN  . rosuvastatin (CRESTOR) 10 MG tablet  . traMADol (ULTRAM) 50 MG tablet  . tretinoin (RETIN-A) 0.05 % cream   No current facility-administered medications for this encounter.     Maia Plan [facility] Pre-Surgical Testing [phone] 11/25/18  4:44 PM

## 2018-11-25 NOTE — Anesthesia Preprocedure Evaluation (Addendum)
Anesthesia Evaluation  Patient identified by MRN, date of birth, ID band Patient awake    Reviewed: Allergy & Precautions, NPO status , Patient's Chart, lab work & pertinent test results  Airway Mallampati: I       Dental  (+) Upper Dentures, Lower Dentures   Pulmonary former smoker,    Pulmonary exam normal        Cardiovascular hypertension, Pt. on medications Normal cardiovascular exam Rhythm:Regular Rate:Normal     Neuro/Psych    GI/Hepatic GERD  Medicated and Controlled,  Endo/Other  Hypothyroidism   Renal/GU      Musculoskeletal   Abdominal Normal abdominal exam  (+)   Peds  Hematology   Anesthesia Other Findings smoker (quit 08/31/48) with h/o nonobstructive CAD, CVA, HTN, hypothyroidism, GERD, depression, migraines, PVCs, failed left total hip arthroplasty scheduled for above procedure 11/27/2018 with Dr. Gaynelle Arabian.   Pt cleared by cardiology.  Per Daune Perch, NP, "Shearon Balo last seen on 5/1/2020by Dr. Percival Spanish. Since that day, Mariella Blackwelder done well.She has no history of known CAD, stroke, renal dysfunction or diabetes. She is quite active for her age, doing all of her own housework or yardwork. She has been limited in her walking and dancing for the last 2 years since her fall with femur fracture but she still walks as much as she can and has no chest discomfort or shortness of breath. She is probably able to complete at least 4 Mets of activity. She is at slight increased surgical risk due to age.Therefore, based on ACC/AHA guidelines, the patient would be at acceptable risk for the planned procedure without further cardiovascular testing."  Anticipate pt can proceed with planned procedure barring acute status change.    Reproductive/Obstetrics                          Anesthesia Physical Anesthesia Plan  ASA: II  Anesthesia Plan: General   Post-op Pain  Management:    Induction:   PONV Risk Score and Plan: 4 or greater and Ondansetron and Dexamethasone  Airway Management Planned: Oral ETT  Additional Equipment:   Intra-op Plan:   Post-operative Plan: Extubation in OR  Informed Consent: I have reviewed the patients History and Physical, chart, labs and discussed the procedure including the risks, benefits and alternatives for the proposed anesthesia with the patient or authorized representative who has indicated his/her understanding and acceptance.     Dental advisory given  Plan Discussed with: CRNA  Anesthesia Plan Comments: (See PAT note 11/21/2018, Konrad Felix, PA-C)       Anesthesia Quick Evaluation

## 2018-11-26 LAB — SARS CORONAVIRUS 2 (TAT 6-24 HRS): SARS Coronavirus 2: NEGATIVE

## 2018-11-27 ENCOUNTER — Inpatient Hospital Stay (HOSPITAL_COMMUNITY): Payer: Medicare Other | Admitting: Physician Assistant

## 2018-11-27 ENCOUNTER — Encounter (HOSPITAL_COMMUNITY): Payer: Self-pay | Admitting: General Practice

## 2018-11-27 ENCOUNTER — Inpatient Hospital Stay (HOSPITAL_COMMUNITY): Payer: Medicare Other

## 2018-11-27 ENCOUNTER — Encounter (HOSPITAL_COMMUNITY)
Admission: RE | Disposition: A | Discharge status: Home Health | Payer: Self-pay | Source: Home / Self Care | Attending: Orthopedic Surgery

## 2018-11-27 ENCOUNTER — Inpatient Hospital Stay (HOSPITAL_COMMUNITY)
Admission: RE | Admit: 2018-11-27 | Discharge: 2018-12-01 | DRG: 468 | Disposition: A | Discharge status: Home Health | Payer: Medicare Other | Attending: Orthopedic Surgery | Admitting: Orthopedic Surgery

## 2018-11-27 ENCOUNTER — Other Ambulatory Visit: Payer: Self-pay

## 2018-11-27 ENCOUNTER — Inpatient Hospital Stay (HOSPITAL_COMMUNITY): Payer: Medicare Other | Admitting: Certified Registered Nurse Anesthetist

## 2018-11-27 DIAGNOSIS — I639 Cerebral infarction, unspecified: Secondary | ICD-10-CM | POA: Diagnosis not present

## 2018-11-27 DIAGNOSIS — Z7951 Long term (current) use of inhaled steroids: Secondary | ICD-10-CM | POA: Diagnosis not present

## 2018-11-27 DIAGNOSIS — T84091A Other mechanical complication of internal left hip prosthesis, initial encounter: Secondary | ICD-10-CM | POA: Diagnosis not present

## 2018-11-27 DIAGNOSIS — Z1159 Encounter for screening for other viral diseases: Secondary | ICD-10-CM

## 2018-11-27 DIAGNOSIS — J45909 Unspecified asthma, uncomplicated: Secondary | ICD-10-CM | POA: Diagnosis not present

## 2018-11-27 DIAGNOSIS — Z471 Aftercare following joint replacement surgery: Secondary | ICD-10-CM | POA: Diagnosis not present

## 2018-11-27 DIAGNOSIS — Z87891 Personal history of nicotine dependence: Secondary | ICD-10-CM

## 2018-11-27 DIAGNOSIS — Z85820 Personal history of malignant melanoma of skin: Secondary | ICD-10-CM

## 2018-11-27 DIAGNOSIS — Z96649 Presence of unspecified artificial hip joint: Secondary | ICD-10-CM

## 2018-11-27 DIAGNOSIS — Z7952 Long term (current) use of systemic steroids: Secondary | ICD-10-CM | POA: Diagnosis not present

## 2018-11-27 DIAGNOSIS — K219 Gastro-esophageal reflux disease without esophagitis: Secondary | ICD-10-CM | POA: Diagnosis present

## 2018-11-27 DIAGNOSIS — M810 Age-related osteoporosis without current pathological fracture: Secondary | ICD-10-CM | POA: Diagnosis present

## 2018-11-27 DIAGNOSIS — K760 Fatty (change of) liver, not elsewhere classified: Secondary | ICD-10-CM | POA: Diagnosis not present

## 2018-11-27 DIAGNOSIS — E039 Hypothyroidism, unspecified: Secondary | ICD-10-CM | POA: Diagnosis present

## 2018-11-27 DIAGNOSIS — T84018A Broken internal joint prosthesis, other site, initial encounter: Secondary | ICD-10-CM

## 2018-11-27 DIAGNOSIS — Z79899 Other long term (current) drug therapy: Secondary | ICD-10-CM

## 2018-11-27 DIAGNOSIS — Y792 Prosthetic and other implants, materials and accessory orthopedic devices associated with adverse incidents: Secondary | ICD-10-CM | POA: Diagnosis present

## 2018-11-27 DIAGNOSIS — Z419 Encounter for procedure for purposes other than remedying health state, unspecified: Secondary | ICD-10-CM

## 2018-11-27 DIAGNOSIS — Z79890 Hormone replacement therapy (postmenopausal): Secondary | ICD-10-CM

## 2018-11-27 DIAGNOSIS — I10 Essential (primary) hypertension: Secondary | ICD-10-CM | POA: Diagnosis present

## 2018-11-27 DIAGNOSIS — T84031A Mechanical loosening of internal left hip prosthetic joint, initial encounter: Secondary | ICD-10-CM | POA: Diagnosis not present

## 2018-11-27 DIAGNOSIS — I2510 Atherosclerotic heart disease of native coronary artery without angina pectoris: Secondary | ICD-10-CM | POA: Diagnosis not present

## 2018-11-27 DIAGNOSIS — Z96642 Presence of left artificial hip joint: Secondary | ICD-10-CM | POA: Diagnosis not present

## 2018-11-27 DIAGNOSIS — E785 Hyperlipidemia, unspecified: Secondary | ICD-10-CM | POA: Diagnosis present

## 2018-11-27 HISTORY — PX: TOTAL HIP REVISION: SHX763

## 2018-11-27 LAB — TYPE AND SCREEN
ABO/RH(D): A POS
Antibody Screen: NEGATIVE

## 2018-11-27 SURGERY — TOTAL HIP REVISION
Anesthesia: General | Site: Hip | Laterality: Left

## 2018-11-27 MED ORDER — BUPIVACAINE-EPINEPHRINE 0.5% -1:200000 IJ SOLN
INTRAMUSCULAR | Status: AC
  Filled 2018-11-27: qty 1

## 2018-11-27 MED ORDER — CHLORTHALIDONE 25 MG PO TABS
25.0000 mg | ORAL_TABLET | Freq: Every day | ORAL | Status: DC | PRN
  Administered 2018-12-01: 25 mg via ORAL
  Filled 2018-11-27: qty 1

## 2018-11-27 MED ORDER — METHOCARBAMOL 500 MG IVPB - SIMPLE MED
500.0000 mg | Freq: Four times a day (QID) | INTRAVENOUS | Status: DC | PRN
  Administered 2018-11-27: 500 mg via INTRAVENOUS
  Filled 2018-11-27 (×2): qty 50
  Filled 2018-11-27: qty 500

## 2018-11-27 MED ORDER — POLYETHYLENE GLYCOL 3350 17 G PO PACK
17.0000 g | PACK | Freq: Every day | ORAL | Status: DC | PRN
  Administered 2018-11-30: 17 g via ORAL

## 2018-11-27 MED ORDER — ONDANSETRON HCL 4 MG/2ML IJ SOLN
INTRAMUSCULAR | Status: AC
  Filled 2018-11-27: qty 2

## 2018-11-27 MED ORDER — CHLORHEXIDINE GLUCONATE 4 % EX LIQD: 60.0000 mL | Freq: Once | CUTANEOUS | Status: DC

## 2018-11-27 MED ORDER — LEVOTHYROXINE SODIUM 75 MCG PO TABS
75.0000 ug | ORAL_TABLET | Freq: Every day | ORAL | Status: DC
  Administered 2018-11-28 – 2018-12-01 (×4): 75 ug via ORAL
  Filled 2018-11-27 (×4): qty 1

## 2018-11-27 MED ORDER — CEFAZOLIN SODIUM-DEXTROSE 1-4 GM/50ML-% IV SOLN
1.0000 g | Freq: Four times a day (QID) | INTRAVENOUS | Status: AC
  Administered 2018-11-27 – 2018-11-28 (×2): 1 g via INTRAVENOUS
  Filled 2018-11-27 (×2): qty 50

## 2018-11-27 MED ORDER — SUGAMMADEX SODIUM 200 MG/2ML IV SOLN
INTRAVENOUS | Status: DC | PRN
  Administered 2018-11-27: 200 mg via INTRAVENOUS

## 2018-11-27 MED ORDER — FENTANYL CITRATE (PF) 100 MCG/2ML IJ SOLN
INTRAMUSCULAR | Status: DC | PRN
  Administered 2018-11-27 (×4): 50 ug via INTRAVENOUS

## 2018-11-27 MED ORDER — CEFAZOLIN SODIUM-DEXTROSE 2-4 GM/100ML-% IV SOLN
2.0000 g | INTRAVENOUS | Status: AC
  Administered 2018-11-27: 2 g via INTRAVENOUS
  Filled 2018-11-27: qty 100

## 2018-11-27 MED ORDER — KETOROLAC TROMETHAMINE 15 MG/ML IJ SOLN
15.0000 mg | Freq: Once | INTRAMUSCULAR | Status: AC
  Administered 2018-11-27: 15 mg via INTRAVENOUS

## 2018-11-27 MED ORDER — METOCLOPRAMIDE HCL 5 MG/ML IJ SOLN: 5.0000 mg | Freq: Three times a day (TID) | INTRAMUSCULAR | Status: DC | PRN

## 2018-11-27 MED ORDER — FLEET ENEMA 7-19 GM/118ML RE ENEM: 1.0000 | ENEMA | Freq: Once | RECTAL | Status: DC | PRN

## 2018-11-27 MED ORDER — TAPENTADOL HCL 50 MG PO TABS
50.0000 mg | ORAL_TABLET | Freq: Four times a day (QID) | ORAL | Status: DC | PRN
  Administered 2018-11-28 – 2018-11-30 (×7): 50 mg via ORAL
  Filled 2018-11-27 (×7): qty 1

## 2018-11-27 MED ORDER — FENTANYL CITRATE (PF) 100 MCG/2ML IJ SOLN
25.0000 ug | INTRAMUSCULAR | Status: DC | PRN
  Administered 2018-11-27 (×3): 50 ug via INTRAVENOUS

## 2018-11-27 MED ORDER — FENTANYL CITRATE (PF) 100 MCG/2ML IJ SOLN
INTRAMUSCULAR | Status: AC
  Administered 2018-11-27: 50 ug via INTRAVENOUS
  Filled 2018-11-27: qty 2

## 2018-11-27 MED ORDER — FLUTICASONE PROPIONATE 50 MCG/ACT NA SUSP
1.0000 | Freq: Every day | NASAL | Status: DC
  Administered 2018-11-29 – 2018-12-01 (×3): 1 via NASAL
  Filled 2018-11-27: qty 16

## 2018-11-27 MED ORDER — PANTOPRAZOLE SODIUM 40 MG PO TBEC
40.0000 mg | DELAYED_RELEASE_TABLET | Freq: Every day | ORAL | Status: DC
  Administered 2018-11-28 – 2018-12-01 (×4): 40 mg via ORAL
  Filled 2018-11-27 (×4): qty 1

## 2018-11-27 MED ORDER — POLYETHYLENE GLYCOL 3350 17 G PO PACK
17.0000 g | PACK | Freq: Every day | ORAL | Status: DC
  Administered 2018-11-28 – 2018-11-29 (×2): 17 g via ORAL
  Filled 2018-11-27 (×4): qty 1

## 2018-11-27 MED ORDER — PROPYLENE GLYCOL 0.6 % OP SOLN: 1.0000 [drp] | Freq: Every day | OPHTHALMIC | Status: DC

## 2018-11-27 MED ORDER — METHOCARBAMOL 500 MG PO TABS: 500.0000 mg | ORAL_TABLET | Freq: Four times a day (QID) | ORAL | Status: DC | PRN

## 2018-11-27 MED ORDER — SODIUM CHLORIDE 0.9 % IR SOLN
Status: DC | PRN
  Administered 2018-11-27: 1000 mL

## 2018-11-27 MED ORDER — ONDANSETRON HCL 4 MG PO TABS: 4.0000 mg | ORAL_TABLET | Freq: Four times a day (QID) | ORAL | Status: DC | PRN

## 2018-11-27 MED ORDER — LORATADINE 10 MG PO TABS
10.0000 mg | ORAL_TABLET | Freq: Every day | ORAL | Status: DC
  Administered 2018-11-28 – 2018-12-01 (×3): 10 mg via ORAL
  Filled 2018-11-27 (×3): qty 1

## 2018-11-27 MED ORDER — POVIDONE-IODINE 10 % EX SWAB: 2.0000 "application " | Freq: Once | CUTANEOUS | Status: DC

## 2018-11-27 MED ORDER — ROCURONIUM BROMIDE 50 MG/5ML IV SOSY
PREFILLED_SYRINGE | INTRAVENOUS | Status: DC | PRN
  Administered 2018-11-27: 50 mg via INTRAVENOUS

## 2018-11-27 MED ORDER — SODIUM CHLORIDE 0.9 % IV SOLN: INTRAVENOUS | Status: DC

## 2018-11-27 MED ORDER — POLYVINYL ALCOHOL 1.4 % OP SOLN
1.0000 [drp] | Freq: Every day | OPHTHALMIC | Status: DC
  Administered 2018-11-29 – 2018-12-01 (×3): 1 [drp] via OPHTHALMIC
  Filled 2018-11-27: qty 15

## 2018-11-27 MED ORDER — MENTHOL 3 MG MT LOZG: 1.0000 | LOZENGE | OROMUCOSAL | Status: DC | PRN

## 2018-11-27 MED ORDER — GABAPENTIN 300 MG PO CAPS
300.0000 mg | ORAL_CAPSULE | Freq: Two times a day (BID) | ORAL | Status: DC
  Administered 2018-11-27 – 2018-12-01 (×8): 300 mg via ORAL
  Filled 2018-11-27 (×8): qty 1

## 2018-11-27 MED ORDER — LIDOCAINE 2% (20 MG/ML) 5 ML SYRINGE
INTRAMUSCULAR | Status: DC | PRN
  Administered 2018-11-27: 80 mg via INTRAVENOUS

## 2018-11-27 MED ORDER — BISACODYL 10 MG RE SUPP: 10.0000 mg | Freq: Every day | RECTAL | Status: DC | PRN

## 2018-11-27 MED ORDER — DEXAMETHASONE SODIUM PHOSPHATE 10 MG/ML IJ SOLN
8.0000 mg | Freq: Once | INTRAMUSCULAR | Status: AC
  Administered 2018-11-27: 8 mg via INTRAVENOUS

## 2018-11-27 MED ORDER — DEXMEDETOMIDINE HCL IN NACL 200 MCG/50ML IV SOLN
INTRAVENOUS | Status: AC
  Filled 2018-11-27: qty 50

## 2018-11-27 MED ORDER — ONDANSETRON HCL 4 MG/2ML IJ SOLN
INTRAMUSCULAR | Status: DC | PRN
  Administered 2018-11-27: 4 mg via INTRAVENOUS

## 2018-11-27 MED ORDER — METOCLOPRAMIDE HCL 5 MG PO TABS: 5.0000 mg | ORAL_TABLET | Freq: Three times a day (TID) | ORAL | Status: DC | PRN

## 2018-11-27 MED ORDER — HYDROMORPHONE HCL 1 MG/ML IJ SOLN
0.5000 mg | INTRAMUSCULAR | Status: DC | PRN
  Administered 2018-11-27 – 2018-11-28 (×3): 0.5 mg via INTRAVENOUS
  Filled 2018-11-27 (×4): qty 0.5

## 2018-11-27 MED ORDER — ASPIRIN EC 325 MG PO TBEC
325.0000 mg | DELAYED_RELEASE_TABLET | Freq: Two times a day (BID) | ORAL | Status: DC
  Administered 2018-11-28 – 2018-12-01 (×7): 325 mg via ORAL
  Filled 2018-11-27 (×7): qty 1

## 2018-11-27 MED ORDER — DEXMEDETOMIDINE HCL 200 MCG/2ML IV SOLN
INTRAVENOUS | Status: DC | PRN
  Administered 2018-11-27: 4 ug via INTRAVENOUS
  Administered 2018-11-27: 8 ug via INTRAVENOUS
  Administered 2018-11-27: 4 ug via INTRAVENOUS

## 2018-11-27 MED ORDER — APOAEQUORIN 20 MG PO CAPS: 20.0000 mg | ORAL_CAPSULE | Freq: Every day | ORAL | Status: DC

## 2018-11-27 MED ORDER — ROSUVASTATIN CALCIUM 10 MG PO TABS
10.0000 mg | ORAL_TABLET | Freq: Every day | ORAL | Status: DC
  Administered 2018-11-27 – 2018-11-30 (×4): 10 mg via ORAL
  Filled 2018-11-27 (×4): qty 1

## 2018-11-27 MED ORDER — PROPOFOL 10 MG/ML IV BOLUS
INTRAVENOUS | Status: AC
  Filled 2018-11-27: qty 40

## 2018-11-27 MED ORDER — MEPERIDINE HCL 50 MG/ML IJ SOLN: 6.2500 mg | INTRAMUSCULAR | Status: DC | PRN

## 2018-11-27 MED ORDER — PROMETHAZINE HCL 25 MG/ML IJ SOLN: 6.2500 mg | INTRAMUSCULAR | Status: DC | PRN

## 2018-11-27 MED ORDER — PHENOL 1.4 % MT LIQD
1.0000 | OROMUCOSAL | Status: DC | PRN
  Filled 2018-11-27: qty 177

## 2018-11-27 MED ORDER — FENTANYL CITRATE (PF) 100 MCG/2ML IJ SOLN
INTRAMUSCULAR | Status: AC
  Filled 2018-11-27: qty 2

## 2018-11-27 MED ORDER — DOCUSATE SODIUM 100 MG PO CAPS
100.0000 mg | ORAL_CAPSULE | Freq: Two times a day (BID) | ORAL | Status: DC
  Administered 2018-11-28 (×2): 100 mg via ORAL
  Filled 2018-11-27 (×6): qty 1

## 2018-11-27 MED ORDER — KETOROLAC TROMETHAMINE 15 MG/ML IJ SOLN
INTRAMUSCULAR | Status: AC
  Filled 2018-11-27: qty 1

## 2018-11-27 MED ORDER — POLYETHYLENE GLYCOL 3350 17 GM/SCOOP PO POWD
17.0000 g | Freq: Every day | ORAL | Status: DC
  Filled 2018-11-27: qty 255

## 2018-11-27 MED ORDER — POTASSIUM CHLORIDE CRYS ER 10 MEQ PO TBCR
10.0000 meq | EXTENDED_RELEASE_TABLET | Freq: Every day | ORAL | Status: DC | PRN
  Administered 2018-12-01: 10 meq via ORAL
  Filled 2018-11-27: qty 1

## 2018-11-27 MED ORDER — LACTATED RINGERS IV SOLN
INTRAVENOUS | Status: DC
  Administered 2018-11-27 (×2): via INTRAVENOUS

## 2018-11-27 MED ORDER — TRANEXAMIC ACID-NACL 1000-0.7 MG/100ML-% IV SOLN
1000.0000 mg | INTRAVENOUS | Status: AC
  Administered 2018-11-27: 1000 mg via INTRAVENOUS
  Filled 2018-11-27: qty 100

## 2018-11-27 MED ORDER — ACETAMINOPHEN 10 MG/ML IV SOLN
1000.0000 mg | Freq: Four times a day (QID) | INTRAVENOUS | Status: DC
  Administered 2018-11-27: 1000 mg via INTRAVENOUS
  Filled 2018-11-27: qty 100

## 2018-11-27 MED ORDER — DEXAMETHASONE SODIUM PHOSPHATE 10 MG/ML IJ SOLN
10.0000 mg | Freq: Once | INTRAMUSCULAR | Status: AC
  Administered 2018-11-28: 10 mg via INTRAVENOUS
  Filled 2018-11-27: qty 1

## 2018-11-27 MED ORDER — DIPHENHYDRAMINE HCL 12.5 MG/5ML PO ELIX
12.5000 mg | ORAL_SOLUTION | ORAL | Status: DC | PRN
  Administered 2018-11-27 – 2018-11-28 (×3): 25 mg via ORAL
  Filled 2018-11-27 (×3): qty 10

## 2018-11-27 MED ORDER — EPHEDRINE SULFATE-NACL 50-0.9 MG/10ML-% IV SOSY
PREFILLED_SYRINGE | INTRAVENOUS | Status: DC | PRN
  Administered 2018-11-27: 10 mg via INTRAVENOUS

## 2018-11-27 MED ORDER — ONDANSETRON HCL 4 MG/2ML IJ SOLN: 4.0000 mg | Freq: Four times a day (QID) | INTRAMUSCULAR | Status: DC | PRN

## 2018-11-27 MED ORDER — PROPOFOL 10 MG/ML IV BOLUS
INTRAVENOUS | Status: DC | PRN
  Administered 2018-11-27: 150 mg via INTRAVENOUS

## 2018-11-27 SURGICAL SUPPLY — 71 items
BAG DECANTER FOR FLEXI CONT (MISCELLANEOUS) ×2 IMPLANT
BAG SPEC THK2 15X12 ZIP CLS (MISCELLANEOUS) ×2
BAG ZIPLOCK 12X15 (MISCELLANEOUS) ×4 IMPLANT
BIT DRILL 2.8X128 (BIT) ×2 IMPLANT
BLADE EXTENDED COATED 6.5IN (ELECTRODE) ×2 IMPLANT
BLADE SAW SGTL 73X25 THK (BLADE) IMPLANT
BRUSH FEMORAL CANAL (MISCELLANEOUS) IMPLANT
CLSR STERI-STRIP ANTIMIC 1/2X4 (GAUZE/BANDAGES/DRESSINGS) ×1 IMPLANT
COVER SURGICAL LIGHT HANDLE (MISCELLANEOUS) ×2 IMPLANT
COVER WAND RF STERILE (DRAPES) IMPLANT
DRAPE INCISE IOBAN 66X45 STRL (DRAPES) ×2 IMPLANT
DRAPE ORTHO SPLIT 77X108 STRL (DRAPES) ×4
DRAPE POUCH INSTRU U-SHP 10X18 (DRAPES) ×2 IMPLANT
DRAPE SURG ORHT 6 SPLT 77X108 (DRAPES) ×2 IMPLANT
DRAPE U-SHAPE 47X51 STRL (DRAPES) ×2 IMPLANT
DRSG ADAPTIC 3X8 NADH LF (GAUZE/BANDAGES/DRESSINGS) ×1 IMPLANT
DRSG AQUACEL AG ADV 3.5X14 (GAUZE/BANDAGES/DRESSINGS) ×1 IMPLANT
DRSG EMULSION OIL 3X16 NADH (GAUZE/BANDAGES/DRESSINGS) ×2 IMPLANT
DRSG MEPILEX BORDER 4X4 (GAUZE/BANDAGES/DRESSINGS) ×4 IMPLANT
DRSG MEPILEX BORDER 4X8 (GAUZE/BANDAGES/DRESSINGS) ×2 IMPLANT
DURAPREP 26ML APPLICATOR (WOUND CARE) ×2 IMPLANT
ELECT REM PT RETURN 15FT ADLT (MISCELLANEOUS) ×2 IMPLANT
EVACUATOR 1/8 PVC DRAIN (DRAIN) ×2 IMPLANT
FACESHIELD WRAPAROUND (MASK) ×8 IMPLANT
FACESHIELD WRAPAROUND OR TEAM (MASK) IMPLANT
GAUZE SPONGE 4X4 12PLY STRL (GAUZE/BANDAGES/DRESSINGS) ×2 IMPLANT
GLOVE BIO SURGEON STRL SZ7 (GLOVE) ×2 IMPLANT
GLOVE BIO SURGEON STRL SZ8 (GLOVE) ×2 IMPLANT
GLOVE BIOGEL PI IND STRL 7.0 (GLOVE) ×1 IMPLANT
GLOVE BIOGEL PI IND STRL 8 (GLOVE) ×1 IMPLANT
GLOVE BIOGEL PI INDICATOR 7.0 (GLOVE) ×1
GLOVE BIOGEL PI INDICATOR 8 (GLOVE) ×1
GOWN STRL REUS W/TWL LRG LVL3 (GOWN DISPOSABLE) ×4 IMPLANT
HANDPIECE INTERPULSE COAX TIP (DISPOSABLE) ×2
HEAD FEMORAL SROM 32 PLUS 0 (Hips) IMPLANT
HIP STEM SROM LG 20X15X225L (Hips) ×2 IMPLANT
IMMOBILIZER KNEE 20 (SOFTGOODS) ×2
IMMOBILIZER KNEE 20 THIGH 36 (SOFTGOODS) IMPLANT
KIT TURNOVER KIT A (KITS) IMPLANT
MANIFOLD NEPTUNE II (INSTRUMENTS) ×2 IMPLANT
MARKER SKIN DUAL TIP RULER LAB (MISCELLANEOUS) ×2 IMPLANT
NDL SAFETY ECLIPSE 18X1.5 (NEEDLE) ×1 IMPLANT
NEEDLE HYPO 18GX1.5 SHARP (NEEDLE) ×2
NS IRRIG 1000ML POUR BTL (IV SOLUTION) ×2 IMPLANT
PACK TOTAL JOINT (CUSTOM PROCEDURE TRAY) ×2 IMPLANT
PASSER SUT SWANSON 36MM LOOP (INSTRUMENTS) IMPLANT
PRESSURIZER FEMORAL UNIV (MISCELLANEOUS) IMPLANT
PROTECTOR NERVE ULNAR (MISCELLANEOUS) ×2 IMPLANT
SET HNDPC FAN SPRY TIP SCT (DISPOSABLE) IMPLANT
SPONGE LAP 18X18 RF (DISPOSABLE) ×2 IMPLANT
SROM FEMORAL HEAD 32 PLUS 0 (Hips) ×2 IMPLANT
STAPLER VISISTAT 35W (STAPLE) IMPLANT
STEM HIP SROM LG 20X15X225L (Hips) IMPLANT
SUCTION FRAZIER HANDLE 12FR (TUBING) ×1
SUCTION TUBE FRAZIER 12FR DISP (TUBING) ×1 IMPLANT
SUT ETHIBOND NAB CT1 #1 30IN (SUTURE) IMPLANT
SUT MNCRL AB 4-0 PS2 18 (SUTURE) ×1 IMPLANT
SUT STRATAFIX 0 PDS 27 VIOLET (SUTURE) ×4
SUT VIC AB 1 CT1 27 (SUTURE) ×6
SUT VIC AB 1 CT1 27XBRD ANTBC (SUTURE) ×3 IMPLANT
SUT VIC AB 2-0 CT1 27 (SUTURE) ×8
SUT VIC AB 2-0 CT1 TAPERPNT 27 (SUTURE) ×3 IMPLANT
SUTURE STRATFX 0 PDS 27 VIOLET (SUTURE) ×1 IMPLANT
SWAB COLLECTION DEVICE MRSA (MISCELLANEOUS) IMPLANT
SWAB CULTURE ESWAB REG 1ML (MISCELLANEOUS) IMPLANT
SYR 50ML LL SCALE MARK (SYRINGE) ×2 IMPLANT
TOWEL OR 17X26 10 PK STRL BLUE (TOWEL DISPOSABLE) ×4 IMPLANT
TOWER CARTRIDGE SMART MIX (DISPOSABLE) IMPLANT
TRAY FOLEY MTR SLVR 16FR STAT (SET/KITS/TRAYS/PACK) ×2 IMPLANT
WATER STERILE IRR 1000ML POUR (IV SOLUTION) ×4 IMPLANT
YANKAUER SUCT BULB TIP 10FT TU (MISCELLANEOUS) ×2 IMPLANT

## 2018-11-27 NOTE — Brief Op Note (Signed)
11/27/2018  5:34 PM  PATIENT:  Kerri [name]  85 y.o. female  PRE-OPERATIVE DIAGNOSIS:  failed left total hip arthroplasty  POST-OPERATIVE DIAGNOSIS:  failed left total hip arthroplasty  PROCEDURE:  Procedure(s) with comments: Left hip femoral revision (Left) - 191min  SURGEON:  Surgeon(s) and Role:    Gaynelle Arabian, MD - Primary  PHYSICIAN ASSISTANT:   ASSISTANTS: Griffith Citron, PA-C   ANESTHESIA:   general  EBL:  250 mL   BLOOD ADMINISTERED:none  DRAINS: (Medium) Hemovact drain(s) in the Left hip with  Suction Open   LOCAL MEDICATIONS USED:  NONE  COUNTS:  YES  TOURNIQUET:  * No tourniquets in log *  DICTATION: .Other Dictation: Dictation Number [id number]  PLAN OF CARE: Admit to inpatient   PATIENT DISPOSITION:  PACU - hemodynamically stable.

## 2018-11-27 NOTE — Plan of Care (Signed)
initiated

## 2018-11-27 NOTE — Transfer of Care (Signed)
Immediate Anesthesia Transfer of Care Note  Patient: Kerri [name]  Procedure(s) Performed: Left hip femoral revision (Left Hip)  Patient Location: PACU  Anesthesia Type:General  Level of Consciousness: awake, alert  and patient cooperative  Airway & Oxygen Therapy: Patient Spontanous Breathing and Patient connected to face mask oxygen  Post-op Assessment: Report given to RN and Post -op Vital signs reviewed and stable  Post vital signs: Reviewed and stable  Last Vitals:  Vitals Value Taken Time  BP 173/86 11/27/18 1801  Temp    Pulse 78 11/27/18 1806  Resp 15 11/27/18 1806  SpO2 100 % 11/27/18 1806  Vitals shown include unvalidated device data.  Last Pain:  Vitals:   11/27/18 1451  TempSrc:   PainSc: 0-No pain         Complications: No apparent anesthesia complications

## 2018-11-27 NOTE — Interval H&P Note (Signed)
History and Physical Interval Note:  11/27/2018 3:50 PM  Kerri [name]  has presented today for surgery, with the diagnosis of failed left total hip arthroplasty.  The various methods of treatment have been discussed with the patient and family. After consideration of risks, benefits and other options for treatment, the patient has consented to  Procedure(s) with comments: Left hip femoral vs total hip arthroplasty revision (Left) - 111min as a surgical intervention.  The patient's history has been reviewed, patient examined, no change in status, stable for surgery.  I have reviewed the patient's chart and labs.  Questions were answered to the patient's satisfaction.     Pilar Plate Jurell Basista

## 2018-11-27 NOTE — Op Note (Signed)
NAMEJISSELL, TRAFTON MEDICAL RECORD QM:[id number] ACCOUNT [id number] DATE OF BIRTH:[birth year] FACILITY: [facility] LOCATION: [facility] PHYSICIAN:Alpha Chouinard Zella Ball, MD  OPERATIVE REPORT  DATE OF PROCEDURE:  11/27/2018  PREOPERATIVE DIAGNOSIS:  Failed left total hip arthroplasty secondary to loose femoral stem.  POSTOPERATIVE DIAGNOSIS:  Failed left total hip arthroplasty secondary to loose femoral stem.  PROCEDURE:  Left femoral revision of total hip arthroplasty.  SURGEON:  Gaynelle Arabian, MD  ASSISTANT:  Griffith Citron, PA-C  ANESTHESIA:  General.  ESTIMATED BLOOD LOSS:  250 mL.  DRAINS:  Hemovac x1.  COMPLICATIONS:  None.  CONDITION:  Stable to recovery.  BRIEF CLINICAL NOTE:  The patient is an 85 year old female who had a left total hip arthroplasty done many years ago.  She did well until approximately a year ago, she started to develop distal thigh pain.  The pain was at a position corresponding with  the tip of her femoral stem.  She got better for a month or 2 and then the pain has recurred and worsened.  Bone scan shows a stress fracture versus loosening of the stem.  She presents now for femoral versus total hip arthroplasty revision.  PROCEDURE IN DETAIL:  After successful administration of general anesthetic, the patient was placed in the right lateral decubitus position with the left side up and held with the PIP positioner.  Left lower extremity was isolated from her perineum with  plastic drapes and prepped and draped in the usual sterile fashion.  Posterolateral incision was made with a 10 blade through subcutaneous tissue to the level of the fascia lata, which was incised in line with the skin incision.  Sciatic nerve was  palpated and protected and the posterior pseudocapsule is incised to get into the joint.  There was a fair amount of heterotopic bone present superiorly and that is removed to gain access to the joint.  The hip was then dislocated.  The positioning of  the  femoral stem is excellent.  I also examined the acetabular polyethylene.  There is no evidence of any appreciable wear and the acetabular component is in excellent position.  We used a bone tamp and mallet to remove the femoral head from the stem.   This was removed easily.  I cleared out any excess bone and soft tissue in the piriformis fossa in order to gain access to the lateral neck of the stem.  This was an S-ROM stem, which has the 2 pieces be in the femoral sleeve proximally and then the  stem, which goes through the sleeve.  The first goal was to remove the stem from the sleeve.  The extraction osteotome was then placed at the junction between the stem and sleeve and this used to disrupt this interface.  The stem was removed from the  sleeve and then the stem is taken out of the femoral canal proximally.  The canal remains intact.  There was a pedestal present distally where the tip of the stem was.  I used an 8 mm reamer to drill through the pedestal.  A long guide rod was then  passed to confirm that the reamer did not exit the canal.  The guide rod is in the canal in all planes.  I decided to ream through the sleeve and we are going to place a longer stem to bypass where the stress reaction was present at the tip of the stem.   We placed flexible reamers over the guide pin up to 16 mm.  The stem  that was removed with a 15 mm diameter stem.  We inspected the sleeve and the sleeve was very stably fixed to the proximal femur with no signs of loosening or migration of the sleeve  at all.  It was felt that if we left the sleeve intact and went with the longer stem, it would accomplish the goal of providing a stable construct and would bypass that area of the stress reaction, thus internally splinting and allowing it to heal.   After the reaming was completed, I placed a trial 20 x 15 extra-long stem, which is 225 mm in length.  This was a curved stem.  It went down the canal perfectly.  The 36+8 neck is  placed proximally and then a 32+0 head.  Hip was reduced and has  outstanding stability with full extension, full external rotation, 70 degrees flexion, 40 degrees adduction, 90 degrees, internal rotation, then 90 degrees of flexion and 70 degrees of internal rotation.  By placing the left leg on top of the leg lengths  were found to be equal.  The trial was removed and the knee permanent 20 x 15 extra-long stem with the left lateral bow is placed through the sleeve proximally and impacted down the femoral canal matching the patient's native anteversion.  We then  placed a 32+0 head, reduced the hip with the same stability parameters.  X-rays taken intraoperatively, AP and lateral, at the tip of the stem to confirm that the stem is in the canal and did not perforate wound was then copiously irrigated with saline  solution and the posterior pseudocapsule reattached to the femur with Ethibond suture.  Subcutaneous was closed over a Hemovac drain with a running 0 Stratafix, subcutaneous tissue closed with interrupted 2-0 Vicryl and subcuticular running 4-0 Monocryl.   Incisions were cleaned and dried and Steri-Strips and a bulky sterile dressing are applied.  She was subsequently placed into a knee immobilizer, awakened and transported to recovery in stable condition.  Note that a surgical assistant was a medical necessity for this procedure to do it in a safe and expeditious manner.  Surgical assistance is necessary for retraction of vital ligaments and neurovascular structures and for proper positioning of the limb  for safe removal of the old implant and safe and accurate placement of the new implant.  AN/NUANCE  D:11/27/2018 T:11/27/2018 JOB:[id number]

## 2018-11-27 NOTE — Progress Notes (Signed)
PHARMACIST - PHYSICIAN ORDER COMMUNICATION  CONCERNING: P&T Medication Policy on Herbal Medications  DESCRIPTION:  This patient's order for:  Apoaeequorin  has been noted.  This product(s) is classified as an "herbal" or natural product. Due to a lack of definitive safety studies or FDA approval, nonstandard manufacturing practices, plus the potential risk of unknown drug-drug interactions while on inpatient medications, the Pharmacy and Therapeutics Committee does not permit the use of "herbal" or natural products of this type within [facility].   ACTION TAKEN: The pharmacy department is unable to verify this order at this time and your patient has been informed of this safety policy. Please reevaluate patient's clinical condition at discharge and address if the herbal or natural product(s) should be resumed at that time.  Dia Sitter, PharmD, BCPS 11/27/2018 8:33 PM

## 2018-11-27 NOTE — Anesthesia Postprocedure Evaluation (Signed)
Anesthesia Post Note  Patient: Marveline Profeta  Procedure(s) Performed: Left hip femoral revision (Left Hip)     Patient location during evaluation: PACU Anesthesia Type: General Level of consciousness: awake and alert Pain management: pain level controlled Vital Signs Assessment: post-procedure vital signs reviewed and stable Respiratory status: spontaneous breathing, nonlabored ventilation and respiratory function stable Cardiovascular status: blood pressure returned to baseline and stable Postop Assessment: no apparent nausea or vomiting Anesthetic complications: no    Last Vitals:  Vitals:   11/27/18 1909 11/27/18 1911  BP:  133/75  Pulse: 70 70  Resp: 16 16  Temp:    SpO2: 100% 100%    Last Pain:  Vitals:   11/27/18 1830  TempSrc:   PainSc: 6                  Lidia Collum

## 2018-11-27 NOTE — Anesthesia Procedure Notes (Signed)
Procedure Name: Intubation Date/Time: 11/27/2018 4:08 PM Performed by: West Pugh, CRNA Pre-anesthesia Checklist: Patient identified, Emergency Drugs available, Suction available, Patient being monitored and Timeout performed Patient Re-evaluated:Patient Re-evaluated prior to induction Oxygen Delivery Method: Circle system utilized Preoxygenation: Pre-oxygenation with 100% oxygen Induction Type: IV induction Ventilation: Mask ventilation without difficulty Laryngoscope Size: Mac and 3 Grade View: Grade II Tube type: Oral Tube size: 7.0 mm Number of attempts: 1 Airway Equipment and Method: Stylet Placement Confirmation: ETT inserted through vocal cords under direct vision,  positive ETCO2,  CO2 detector and breath sounds checked- equal and bilateral Secured at: 20 cm Tube secured with: Tape Dental Injury: Teeth and Oropharynx as per pre-operative assessment

## 2018-11-28 ENCOUNTER — Encounter (HOSPITAL_COMMUNITY): Payer: Self-pay | Admitting: Orthopedic Surgery

## 2018-11-28 LAB — CBC
HCT: 33.9 % — ABNORMAL LOW (ref 36.0–46.0)
Hemoglobin: 11.1 g/dL — ABNORMAL LOW (ref 12.0–15.0)
MCH: 32.2 pg (ref 26.0–34.0)
MCHC: 32.7 g/dL (ref 30.0–36.0)
MCV: 98.3 fL (ref 80.0–100.0)
Platelets: 170 10*3/uL (ref 150–400)
RBC: 3.45 MIL/uL — ABNORMAL LOW (ref 3.87–5.11)
RDW: 12 % (ref 11.5–15.5)
WBC: 9 10*3/uL (ref 4.0–10.5)
nRBC: 0 % (ref 0.0–0.2)

## 2018-11-28 LAB — BASIC METABOLIC PANEL
Anion gap: 9 (ref 5–15)
BUN: 12 mg/dL (ref 8–23)
CO2: 25 mmol/L (ref 22–32)
Calcium: 8.4 mg/dL — ABNORMAL LOW (ref 8.9–10.3)
Chloride: 100 mmol/L (ref 98–111)
Creatinine, Ser: 0.64 mg/dL (ref 0.44–1.00)
GFR calc Af Amer: >60 mL/min (ref >60)
GFR calc non Af Amer: >60 mL/min (ref >60)
Glucose, Bld: 172 mg/dL — ABNORMAL HIGH (ref 70–99)
Potassium: 3.8 mmol/L (ref 3.5–5.1)
Sodium: 134 mmol/L — ABNORMAL LOW (ref 135–145)

## 2018-11-28 MED ORDER — HYDROCODONE-ACETAMINOPHEN 5-325 MG PO TABS: 2.0000 | ORAL_TABLET | ORAL | Status: DC | PRN

## 2018-11-28 MED ORDER — HYDROCODONE-ACETAMINOPHEN 5-325 MG PO TABS
1.0000 | ORAL_TABLET | ORAL | Status: DC | PRN
  Filled 2018-11-28: qty 1

## 2018-11-28 NOTE — Evaluation (Signed)
Physical Therapy Evaluation Patient Details Name: Kerri [name] MRN: [id number] DOB: 09-Jun-1929 Today's Date: 11/28/2018   History of Present Illness  Pt s/p L THR revision and with hx of back surgery and CAD  Clinical Impression  Pt s/p LTHR revision and presents with decreased L LE strength/ROM and post op pain limiting functional mobility.  Pt hope to progress to dc home with assist of family and PCA.    Follow Up Recommendations Follow surgeon's recommendation for DC plan and follow-up therapies    Equipment Recommendations  Rolling walker with 5" wheels    Recommendations for Other Services OT consult     Precautions / Restrictions Precautions Precautions: Posterior Hip;Fall Precaution Booklet Issued: Yes (comment) Restrictions Weight Bearing Restrictions: No Other Position/Activity Restrictions: WBAT      Mobility  Bed Mobility Overal bed mobility: Needs Assistance Bed Mobility: Supine to Sit     Supine to sit: Mod assist     General bed mobility comments: increased time with cues for sequence, use of R LE to self assist and adherence to THP.  Physical assist to manage L LE and to bring trunk to upright  Transfers Overall transfer level: Needs assistance Equipment used: Rolling walker (2 wheeled) Transfers: Sit to/from Stand Sit to Stand: Min assist;+2 physical assistance;+2 safety/equipment;From elevated surface         General transfer comment: cues for LE management and use of UEs to self assist and for adherence to THP  Ambulation/Gait Ambulation/Gait assistance: Min assist;+2 safety/equipment Gait Distance (Feet): 38 Feet Assistive device: Rolling walker (2 wheeled) Gait Pattern/deviations: Step-to pattern;Decreased step length - right;Decreased step length - left;Shuffle;Trunk flexed Gait velocity: decr   General Gait Details: cues for sequence, posture and position from [employer]            Wheelchair Mobility    Modified Rankin (Stroke  Patients Only)       Balance Overall balance assessment: Needs assistance Sitting-balance support: No upper extremity supported;Feet supported Sitting balance-Leahy Scale: Good     Standing balance support: Bilateral upper extremity supported Standing balance-Leahy Scale: Poor                               Pertinent Vitals/Pain Pain Assessment: Faces Faces Pain Scale: Hurts little more Pain Location: L hip Pain Descriptors / Indicators: Aching;Sore Pain Intervention(s): Limited activity within patient's tolerance;Monitored during session;Premedicated before session;Ice applied    Home Living Family/patient expects to be discharged to:: Private residence Living Arrangements: Alone Available Help at Discharge: Family;Personal care attendant Type of Home: House Home Access: Stairs to enter Entrance Stairs-Rails: Psychiatric nurse of Steps: 6 Home Layout: One level Home Equipment: Environmental consultant - 4 wheels;Cane - single point Additional Comments: Pt states she will dc to daughters home and has hired a PCA to assist starting Sunday.  Pt states dtr and SIL are limited in ability to assist    Prior Function Level of Independence: Independent;Independent with assistive device(s)               Hand Dominance        Extremity/Trunk Assessment   Upper Extremity Assessment Upper Extremity Assessment: Overall WFL for tasks assessed    Lower Extremity Assessment Lower Extremity Assessment: LLE deficits/detail LLE Deficits / Details: strength at hip 2/5 with AAROM at hip to 80 flex and 15 abd       Communication   Communication: No difficulties  Cognition Arousal/Alertness:  Awake/alert Behavior During Therapy: Impulsive Overall Cognitive Status: No family/caregiver present to determine baseline cognitive functioning                                 General Comments: Difficulty with memory and with staying on task      General  Comments      Exercises Total Joint Exercises Ankle Circles/Pumps: AROM;Both;20 reps;Supine Quad Sets: AROM;Both;10 reps;Supine Heel Slides: AAROM;Left;20 reps;Supine Hip ABduction/ADduction: AAROM;Left;15 reps;Supine   Assessment/Plan    PT Assessment Patient needs continued PT services  PT Problem List Decreased strength;Decreased range of motion;Decreased activity tolerance;Decreased balance;Decreased mobility;Decreased cognition;Decreased knowledge of use of DME;Pain;Decreased knowledge of precautions;Decreased safety awareness       PT Treatment Interventions DME instruction;Gait training;Stair training;Functional mobility training;Therapeutic activities;Therapeutic exercise;Patient/family education    PT Goals (Current goals can be found in the Care Plan section)  Acute Rehab PT Goals Patient Stated Goal: RegaiN IND PT Goal Formulation: With patient Time For Goal Achievement: 12/12/18 Potential to Achieve Goals: Good    Frequency 7X/week   Barriers to discharge        Co-evaluation               AM-PAC PT "6 Clicks" Mobility  Outcome Measure Help needed turning from your back to your side while in a flat bed without using bedrails?: A Lot Help needed moving from lying on your back to sitting on the side of a flat bed without using bedrails?: A Lot Help needed moving to and from a bed to a chair (including a wheelchair)?: A Lot Help needed standing up from a chair using your arms (e.g., wheelchair or bedside chair)?: A Lot Help needed to walk in hospital room?: A Little Help needed climbing 3-5 steps with a railing? : A Lot 6 Click Score: 13    End of Session Equipment Utilized During Treatment: Gait belt Activity Tolerance: Patient tolerated treatment well Patient left: in chair;with call bell/phone within reach;with nursing/sitter in room;with chair alarm set Nurse Communication: Mobility status PT Visit Diagnosis: Difficulty in walking, not elsewhere  classified (R26.2)    Time: 2446-2863 PT Time Calculation (min) (ACUTE ONLY): 53 min   Charges:   PT Evaluation $PT Eval Low Complexity: 1 Low PT Treatments $Gait Training: 8-22 mins $Therapeutic Exercise: 8-22 mins        [place] Pager [phone] Office [phone]   Enis Leatherwood 11/28/2018, 1:48 PM

## 2018-11-28 NOTE — Progress Notes (Signed)
PT Cancellation Note  Patient Details Name: Kerri [name] MRN: [id number] DOB: 05/28/1929   Cancelled Treatment:     PT attempted this pm, pt states "you should have come earlier, I'm in bed now and going to sleep".  Will follow in am.   Draven Natter 11/28/2018, 4:52 PM

## 2018-11-28 NOTE — TOC Progression Note (Addendum)
Transition of Care [name]) - Progression Note    Patient Details  Name: Kerri [name] MRN: [id number] Date of Birth: 1929-10-01  Transition of Care [facility]) CM/SW [place], [place] Phone Number: 11/28/2018, 10:29 AM  Clinical Narrative:    Patient has used [facility] at home in the past for family and request to use the services again.  CSW reached out to rep. Ronalee Belts.  Physician will need to put in orders for Home Health.   Patient now agreeable to using a rolling walker.  CSW ordered walker through [employer] Rep- Zack Blank @11 :34am, orders in.      Barriers to Discharge: No Barriers Identified  Expected Discharge Plan and Services           Expected Discharge Date: 11/28/18               DME Arranged: (Declines Walker, states she will use her rolling walker with a seat.)         HH Arranged: PT [place]: [facility] [employer] Date [place]: 11/28/18 Time [place]: [phone] Representative spoke with at [place]: [place]) Interventions    Readmission Risk Interventions No flowsheet data found.

## 2018-11-28 NOTE — Progress Notes (Signed)
   Subjective: 1 Day Post-Op Procedure(s) (LRB): Left hip femoral revision (Left) Patient reports pain as mild.   Patient seen in rounds with Dr. Wynelle Link. Patient is well, and has had no acute complaints or problems other than pain in the left hip. Denies chest pain or SOB. [place] catheter to be removed this AM. No issues overnight.  We will begin therapy today.   Objective: Vital signs in last 24 hours: Temp:  [96.8 F (36 C)-98.4 F (36.9 C)] 97.5 F (36.4 C) (07/09 0618) Pulse Rate:  [69-95] 69 (07/09 0618) Resp:  [12-18] 18 (07/09 0618) BP: (100-173)/(64-87) 100/64 (07/09 0618) SpO2:  [97 %-100 %] 99 % (07/09 0618) Weight:  [58 kg] 58 kg (07/08 2030)  Intake/Output from previous day:  Intake/Output Summary (Last 24 hours) at 11/28/2018 0801 Last data filed at 11/28/2018 0600 Gross per 24 hour  Intake 3250 ml  Output 1355 ml  Net 1895 ml    Labs: Recent Labs    11/28/18 0247  HGB 11.1*   Recent Labs    11/28/18 0247  WBC 9.0  RBC 3.45*  HCT 33.9*  PLT 170   Recent Labs    11/28/18 0247  NA 134*  K 3.8  CL 100  CO2 25  BUN 12  CREATININE 0.64  GLUCOSE 172*  CALCIUM 8.4*   Exam: General - Patient is Alert and Oriented Extremity - Neurologically intact Neurovascular intact Sensation intact distally Dorsiflexion/Plantar flexion intact Dressing - dressing C/D/I Motor Function - intact, moving foot and toes well on exam.   Past Medical History:  Diagnosis Date  . Adenomatous colon polyp   . Asthma   . CAD (coronary artery disease)    Chest CTA 10/07: 40% or less pLAD;  ETT 8/09: negative  . Carotid bruit   . Chronic back pain   . Depression   . Diverticulosis   . DJD (degenerative joint disease)   . Dyslipidemia   . Dysrhythmia    pvcs  . Fatty liver   . GERD (gastroesophageal reflux disease)   . HTN (hypertension)   . Hypothyroidism   . IBS (irritable bowel syndrome)   . Internal hemorrhoid   . Melanoma ([place])    metastatic; left leg; s/p  multiple excisions  . Migraine   . Osteoporosis   . Spinal stenosis     Assessment/Plan: 1 Day Post-Op Procedure(s) (LRB): Left hip femoral revision (Left) Principal Problem:   Failed total hip arthroplasty (HCC)  Estimated body mass index is 21.28 kg/m as calculated from the following:   Height as of this encounter: 5\' 5"  (1.651 m).   Weight as of this encounter: 58 kg. Advance diet Up with therapy  DVT Prophylaxis - Aspirin Weight bearing as tolerated D/C knee immobilizer Hemovac pulled without difficulty Begin therapy Hip precautions discussed with patient  Plan is to go Home after hospital stay. Plan for discharge tomorrow pending progress with therapy.  Theresa Duty, PA-C Orthopedic Surgery 11/28/2018, 8:01 AM

## 2018-11-28 NOTE — Discharge Instructions (Addendum)
Dr. Gaynelle Arabian Total Joint Specialist [facility] [address], [place], [place] [address] [phone]  POSTERIOR TOTAL HIP REVISION POSTOPERATIVE DIRECTIONS  Hip Rehabilitation, Guidelines Following Surgery  The results of a hip operation are greatly improved after range of motion and muscle strengthening exercises. Follow all safety measures which are given to protect your hip. If any of these exercises cause increased pain or swelling in your joint, decrease the amount until you are comfortable again. Then slowly increase the exercises. Call your caregiver if you have problems or questions.   HOME CARE INSTRUCTIONS   Remove items at home which could result in a fall. This includes throw rugs or furniture in walking pathways.   ICE to the affected hip every three hours for 30 minutes at a time and then as needed for pain and swelling.  Continue to use ice on the hip for pain and swelling from surgery. You may notice swelling that will progress down to the foot and ankle.  This is normal after surgery.  Elevate the leg when you are not up walking on it.    Continue to use the breathing machine which will help keep your temperature down.  It is common for your temperature to cycle up and down following surgery, especially at night when you are not up moving around and exerting yourself.  The breathing machine keeps your lungs expanded and your temperature down.  DIET You may resume your previous home diet once your are discharged from the hospital.  DRESSING / WOUND CARE / SHOWERING You may change your dressing 3-5 days after surgery.  Then change the dressing every day with sterile gauze.  Please use good hand washing techniques before changing the dressing.  Do not use any lotions or creams on the incision until instructed by your surgeon.  You have been given an extra Gel Aquacel bandage.  Please remove your bandage in two days (Tuesday) and place the new gel bandage  on your incision. Leave that one on for 4 more days and then can remove that one and leave it uncovered .  You may start showering once you are discharged home but do not submerge the incision under water. Just pat the incision dry and apply a dry gauze dressing on daily. Change the surgical dressing daily and reapply a dry dressing each time.  ACTIVITY Walk with your walker as instructed. Use walker as long as suggested by your caregivers. Avoid periods of inactivity such as sitting longer than an hour when not asleep. This helps prevent blood clots.  You may resume a sexual relationship in one month or when given the OK by your doctor.  You may return to work once you are cleared by your doctor.  Do not drive a car for 6 weeks or until released by you surgeon.  Do not drive while taking narcotics.  WEIGHT BEARING Weight bearing as tolerated with assist device (walker, cane, etc) as directed, use it as long as suggested by your surgeon or therapist, typically at least 4-6 weeks.  POSTOPERATIVE CONSTIPATION PROTOCOL Constipation - defined medically as fewer than three stools per week and severe constipation as less than one stool per week.  One of the most common issues patients have following surgery is constipation.  Even if you have a regular bowel pattern at home, your normal regimen is likely to be disrupted due to multiple reasons following surgery.  Combination of anesthesia, postoperative narcotics, change in appetite and fluid  intake all can affect your bowels.  In order to avoid complications following surgery, here are some recommendations in order to help you during your recovery period.  Colace (docusate) - Pick up an over-the-counter form of Colace or another stool softener and take twice a day as long as you are requiring postoperative pain medications.  Take with a full glass of water daily.  If you experience loose stools or diarrhea, hold the colace until you stool forms back  up.  If your symptoms do not get better within 1 week or if they get worse, check with your doctor.  Dulcolax (bisacodyl) - Pick up over-the-counter and take as directed by the product packaging as needed to assist with the movement of your bowels.  Take with a full glass of water.  Use this product as needed if not relieved by Colace only.   MiraLax (polyethylene glycol) - Pick up over-the-counter to have on hand.  MiraLax is a solution that will increase the amount of water in your bowels to assist with bowel movements.  Take as directed and can mix with a glass of water, juice, soda, coffee, or tea.  Take if you go more than two days without a movement. Do not use MiraLax more than once per day. Call your doctor if you are still constipated or irregular after using this medication for 7 days in a row.  If you continue to have problems with postoperative constipation, please contact the office for further assistance and recommendations.  If you experience "the worst abdominal pain ever" or develop nausea or vomiting, please contact the office immediatly for further recommendations for treatment.  ITCHING  If you experience itching with your medications, try taking only a single pain pill, or even half a pain pill at a time.  You can also use Benadryl over the counter for itching or also to help with sleep.   TED HOSE STOCKINGS Wear the elastic stockings on both legs for three weeks following surgery during the day but you may remove then at night for sleeping.  MEDICATIONS See your medication summary on the After Visit Summary that the nursing staff will review with you prior to discharge.  You may have some home medications which will be placed on hold until you complete the course of blood thinner medication.  It is important for you to complete the blood thinner medication as prescribed by your surgeon.  Continue your approved medications as instructed at time of discharge.  PRECAUTIONS If  you experience chest pain or shortness of breath - call 911 immediately for transfer to the hospital emergency department.  If you develop a fever greater that 101 F, purulent drainage from wound, increased redness or drainage from wound, foul odor from the wound/dressing, or calf pain - CONTACT YOUR SURGEON.                                                   FOLLOW-UP APPOINTMENTS Make sure you keep all of your appointments after your operation with your surgeon and caregivers. You should call the office at the above phone number and make an appointment for approximately two weeks after the date of your surgery or on the date instructed by your surgeon outlined in the "After Visit Summary".  RANGE OF MOTION AND STRENGTHENING EXERCISES  These exercises are designed to  help you keep full movement of your hip joint. Follow your caregiver's or physical therapist's instructions. Perform all exercises about fifteen times, three times per day or as directed. Exercise both hips, even if you have had only one joint replacement. These exercises can be done on a training (exercise) mat, on the floor, on a table or on a bed. Use whatever works the best and is most comfortable for you. Use music or television while you are exercising so that the exercises are a pleasant break in your day. This will make your life better with the exercises acting as a break in routine you can look forward to.   Lying on your back, slowly slide your foot toward your buttocks, raising your knee up off the floor. Then slowly slide your foot back down until your leg is straight again.   Lying on your back spread your legs as far apart as you can without causing discomfort.   Lying on your side, raise your upper leg and foot straight up from the floor as far as is comfortable. Slowly lower the leg and repeat.   Lying on your back, tighten up the muscle in the front of your thigh (quadriceps muscles). You can do this by keeping your leg  straight and trying to raise your heel off the floor. This helps strengthen the largest muscle supporting your knee.   Lying on your back, tighten up the muscles of your buttocks both with the legs straight and with the knee bent at a comfortable angle while keeping your heel on the floor.   IF YOU ARE TRANSFERRED TO A SKILLED REHAB FACILITY If the patient is transferred to a skilled rehab facility following release from the hospital, a list of the current medications will be sent to the facility for the patient to continue.  When discharged from the skilled rehab facility, please have the facility set up the patient's [place] prior to being released. Also, the skilled facility will be responsible for providing the patient with their medications at time of release from the facility to include their pain medication, the muscle relaxants, and their blood thinner medication. If the patient is still at the rehab facility at time of the two week follow up appointment, the skilled rehab facility will also need to assist the patient in arranging follow up appointment in our office and any transportation needs.  MAKE SURE YOU:   Understand these instructions.   Get help right away if you are not doing well or get worse.    Pick up stool softner and laxative for home use following surgery while on pain medications. Do not submerge incision under water. Please use good hand washing techniques while changing dressing each day. May shower starting three days after surgery. Please use a clean towel to pat the incision dry following showers. Continue to use ice for pain and swelling after surgery. Do not use any lotions or creams on the incision until instructed by your surgeon.

## 2018-11-29 LAB — CBC
HCT: 31.2 % — ABNORMAL LOW (ref 36.0–46.0)
Hemoglobin: 10 g/dL — ABNORMAL LOW (ref 12.0–15.0)
MCH: 31.8 pg (ref 26.0–34.0)
MCHC: 32.1 g/dL (ref 30.0–36.0)
MCV: 99.4 fL (ref 80.0–100.0)
Platelets: 171 10*3/uL (ref 150–400)
RBC: 3.14 MIL/uL — ABNORMAL LOW (ref 3.87–5.11)
RDW: 12.1 % (ref 11.5–15.5)
WBC: 10.5 10*3/uL (ref 4.0–10.5)
nRBC: 0 % (ref 0.0–0.2)

## 2018-11-29 LAB — BASIC METABOLIC PANEL
Anion gap: 10 (ref 5–15)
BUN: 11 mg/dL (ref 8–23)
CO2: 26 mmol/L (ref 22–32)
Calcium: 8.4 mg/dL — ABNORMAL LOW (ref 8.9–10.3)
Chloride: 99 mmol/L (ref 98–111)
Creatinine, Ser: 0.64 mg/dL (ref 0.44–1.00)
GFR calc Af Amer: >60 mL/min (ref >60)
GFR calc non Af Amer: >60 mL/min (ref >60)
Glucose, Bld: 119 mg/dL — ABNORMAL HIGH (ref 70–99)
Potassium: 3.2 mmol/L — ABNORMAL LOW (ref 3.5–5.1)
Sodium: 135 mmol/L (ref 135–145)

## 2018-11-29 LAB — SARS CORONAVIRUS 2 BY RT PCR (HOSPITAL ORDER, PERFORMED IN ~~LOC~~ HOSPITAL LAB): SARS Coronavirus 2: NEGATIVE

## 2018-11-29 MED ORDER — METHOCARBAMOL 500 MG PO TABS: 500.0000 mg | ORAL_TABLET | Freq: Four times a day (QID) | ORAL | 0 refills | Status: DC | PRN

## 2018-11-29 MED ORDER — POTASSIUM CHLORIDE CRYS ER 20 MEQ PO TBCR
20.0000 meq | EXTENDED_RELEASE_TABLET | Freq: Two times a day (BID) | ORAL | Status: AC
  Administered 2018-11-29 (×2): 20 meq via ORAL
  Filled 2018-11-29 (×2): qty 1

## 2018-11-29 MED ORDER — TAPENTADOL HCL 50 MG PO TABS: 50.0000 mg | ORAL_TABLET | Freq: Four times a day (QID) | ORAL | 0 refills | Status: DC | PRN

## 2018-11-29 MED ORDER — ASPIRIN 325 MG PO TBEC: 325.0000 mg | DELAYED_RELEASE_TABLET | Freq: Two times a day (BID) | ORAL | 0 refills | Status: AC

## 2018-11-29 NOTE — Plan of Care (Signed)
  Problem: Education: Goal: Knowledge of General Education information will improve Description: Including pain rating scale, medication(s)/side effects and non-pharmacologic comfort measures Outcome: Progressing   Problem: Health Behavior/Discharge Planning: Goal: Ability to manage health-related needs will improve Outcome: Progressing   Problem: Clinical Measurements: Goal: Ability to maintain clinical measurements within normal limits will improve Outcome: Progressing Goal: Will remain free from infection Outcome: Progressing Goal: Diagnostic test results will improve Outcome: Progressing Goal: Respiratory complications will improve Outcome: Progressing   Problem: Activity: Goal: Risk for activity intolerance will decrease Outcome: Progressing   Problem: Coping: Goal: Level of anxiety will decrease Outcome: Progressing   Problem: Elimination: Goal: Will not experience complications related to bowel motility Outcome: Progressing

## 2018-11-29 NOTE — Progress Notes (Signed)
The patient's daughter, [name], called with concerns that her mother had been exposed to Covid-19 after her initial test preoperatively.  She stated that they had eaten at [facility], which has since closed due to staff testing positive for Covid-19, and that she had received a call stating they may have had an exposure.  She demanded her mom be retested prior to d/c because of this.  She stated that someone from the Health Department was supposed to be calling the hospital to inform us the possible exposure. I explained to her that, to my knowledge, we had not received a call to that effect.  I acknowledged her concern about the patient (her mom), and relayed to her that I would inform Dr. Wynelle Link of the potential exposure and that it would be his decision on whether a repeat test would be necessary. She stated her understanding of the discussed course of action and thanked me for my time.  I will place a physician sticky note and anticipate speaking with Dr. Wynelle Link and/or Theresa Duty, PA in the morning.

## 2018-11-29 NOTE — Progress Notes (Signed)
   Subjective: 2 Days Post-Op Procedure(s) (LRB): Left hip femoral revision (Left) Patient reports pain as moderate.   Patient seen in rounds with Dr. Wynelle Link. Patient is well, and has had no acute complaints or problems other than soreness in the left hip. Denies chest pain or SOB. Voiding without difficulty and positive flatus. No issues overnight. Daughter called yesterday reporting to nurse that patient had been exposed to Covid-19 at [employer] after preoperative testing. Will order SARS Covid swab. Plan is to go Home after hospital stay.  Objective: Vital signs in last 24 hours: Temp:  [98.3 F (36.8 C)-98.5 F (36.9 C)] 98.5 F (36.9 C) (07/09 2203) Pulse Rate:  [69-78] 69 (07/09 2203) Resp:  [16] 16 (07/09 2203) BP: (91-122)/(60-66) 122/64 (07/09 2203) SpO2:  [93 %-99 %] 93 % (07/09 2203)  Intake/Output from previous day:  Intake/Output Summary (Last 24 hours) at 11/29/2018 0814 Last data filed at 11/29/2018 0605 Gross per 24 hour  Intake 1500 ml  Output 1400 ml  Net 100 ml   Labs: Recent Labs    11/28/18 0247 11/29/18 0231  HGB 11.1* 10.0*   Recent Labs    11/28/18 0247 11/29/18 0231  WBC 9.0 10.5  RBC 3.45* 3.14*  HCT 33.9* 31.2*  PLT 170 171   Recent Labs    11/28/18 0247 11/29/18 0231  NA 134* 135  K 3.8 3.2*  CL 100 99  CO2 25 26  BUN 12 11  CREATININE 0.64 0.64  GLUCOSE 172* 119*  CALCIUM 8.4* 8.4*   No results for input(s): LABPT, INR in the last 72 hours.  Exam: General - Patient is Alert and Oriented Extremity - Neurologically intact Neurovascular intact Sensation intact distally Dorsiflexion/Plantar flexion intact Dressing/Incision - clean, dry, no drainage Motor Function - intact, moving foot and toes well on exam.   Past Medical History:  Diagnosis Date  . Adenomatous colon polyp   . Asthma   . CAD (coronary artery disease)    Chest CTA 10/07: 40% or less pLAD;  ETT 8/09: negative  . Carotid bruit   . Chronic back  pain   . Depression   . Diverticulosis   . DJD (degenerative joint disease)   . Dyslipidemia   . Dysrhythmia    pvcs  . Fatty liver   . GERD (gastroesophageal reflux disease)   . HTN (hypertension)   . Hypothyroidism   . IBS (irritable bowel syndrome)   . Internal hemorrhoid   . Melanoma ([place])    metastatic; left leg; s/p multiple excisions  . Migraine   . Osteoporosis   . Spinal stenosis     Assessment/Plan: 2 Days Post-Op Procedure(s) (LRB): Left hip femoral revision (Left) Principal Problem:   Failed total hip arthroplasty (HCC)  Estimated body mass index is 21.28 kg/m as calculated from the following:   Height as of this encounter: 5\' 5"  (1.651 m).   Weight as of this encounter: 58 kg. Up with therapy  DVT Prophylaxis - Aspirin Weight-bearing as tolerated  Potassium dropped from 3.8 yesterday to 3.2 this AM, two doses of 20 mEq KCl ordered.  Plan for discharge to home with HHPT tomorrow. Follow-up in the office in 2 weeks.   Theresa Duty, PA-C Orthopedic Surgery 11/29/2018, 8:14 AM

## 2018-11-29 NOTE — Evaluation (Signed)
Occupational Therapy Evaluation Patient Details Name: Kerri [name] MRN: [id number] DOB: 11/21/1929 Today's Date: 11/29/2018    History of Present Illness Pt s/p L THR revision and with hx of back surgery and CAD   Clinical Impression   Pt was admitted for the above sx.  Reviewed THPs and ambulated to bathroom to use toilet. Pt has an accessible shower stall at home. She plans to hire assistance as daughter cannot help her much physically.  Pt needs reinforcement with THPs. Will follow with the goals below.    Follow Up Recommendations  Supervision/Assistance - 24 hour    Equipment Recommendations  (none if she has toilet riser with rails)    Recommendations for Other Services       Precautions / Restrictions Precautions Precautions: Posterior Hip;Fall Precaution Booklet Issued: Yes (comment) Restrictions Other Position/Activity Restrictions: WBAT      Mobility Bed Mobility         Supine to sit: Min assist     General bed mobility comments: to avoid IR  Transfers   Equipment used: Rolling walker (2 wheeled)   Sit to Stand: Min guard         General transfer comment: cues for hand placement. Pt did advance LLE for sit to stand without cues    Balance                                           ADL either performed or assessed with clinical judgement   ADL Overall ADL's : Needs assistance/impaired Eating/Feeding: Independent   Grooming: Standing;Min guard   Upper Body Bathing: Set up;Supervision/ safety   Lower Body Bathing: Moderate assistance   Upper Body Dressing : Set up;Supervision/safety   Lower Body Dressing: Maximal assistance;Sit to/from stand   Toilet Transfer: Min guard;Ambulation;BSC;RW   Toileting- Clothing Manipulation and Hygiene: Minimal assistance;Sit to/from stand Toileting - Clothing Manipulation Details (indicate cue type and reason): pt forgot to pull underwear up after sitting on commode:  cues and assistance  given       General ADL Comments: reviewed posterior THPs. Pt wanted to return back to bed after using toilet due to neck discomfort     Vision         Perception     Praxis      Pertinent Vitals/Pain Pain Assessment: Faces Faces Pain Scale: Hurts a little bit Pain Location: L hip Pain Descriptors / Indicators: Aching;Sore Pain Intervention(s): Limited activity within patient's tolerance;Monitored during session;Repositioned;Ice applied     Hand Dominance     Extremity/Trunk Assessment Upper Extremity Assessment Upper Extremity Assessment: Overall WFL for tasks assessed           Communication Communication Communication: Expressive difficulties(word finding)   Cognition Arousal/Alertness: Awake/alert Behavior During Therapy: Impulsive Overall Cognitive Status: No family/caregiver present to determine baseline cognitive functioning                                 General Comments: decreased memory for precautions and pt loses her place in conversation   General Comments       Exercises     Shoulder Instructions      Home Living Family/patient expects to be discharged to:: Private residence Living Arrangements: Alone                 Bathroom  Shower/Tub: (accessible shower)   Bathroom Toilet: Handicapped height     Home Equipment: Shower seat;Toilet riser          Prior Functioning/Environment Level of Independence: Independent;Independent with assistive device(s)        Comments: plans to hire assistance. Daughter cannot help due to fibromyalgia        OT Problem List: Decreased knowledge of precautions;Decreased knowledge of use of DME or AE;Pain;Decreased strength;Decreased cognition      OT Treatment/Interventions: Self-care/ADL training;DME and/or AE instruction;Patient/family education;Balance training;Therapeutic activities;Cognitive remediation/compensation    OT Goals(Current goals can be found in the care plan  section) Acute Rehab OT Goals Patient Stated Goal: RegaiN IND OT Goal Formulation: With patient Time For Goal Achievement: 12/13/18 Potential to Achieve Goals: Good ADL Goals Pt Will Transfer to Toilet: with supervision;ambulating;bedside commode Additional ADL Goal #1: pt will either use AE with supervision for LB adls or verbalize assistance she needs for this Additional ADL Goal #2: pt will recall 3/3 posterior THPS using sign in room as needed  OT Frequency: Min 2X/week   Barriers to D/C:            Co-evaluation              AM-PAC OT "6 Clicks" Daily Activity     Outcome Measure Help from another person eating meals?: None Help from another person taking care of personal grooming?: A Little Help from another person toileting, which includes using toliet, bedpan, or urinal?: A Little Help from another person bathing (including washing, rinsing, drying)?: A Lot Help from another person to put on and taking off regular upper body clothing?: A Little Help from another person to put on and taking off regular lower body clothing?: A Lot 6 Click Score: 17   End of Session    Activity Tolerance: Patient tolerated treatment well Patient left: in bed;with call bell/phone within reach;with bed alarm set  OT Visit Diagnosis: Pain Pain - Right/Left: Left Pain - part of body: Hip                Time: 0902-0928 OT Time Calculation (min): 26 min Charges:  OT General Charges $OT Visit: 1 Visit OT Evaluation $OT Eval Low Complexity: 1 Low OT Treatments $Self Care/Home Management : 8-22 mins  Lesle Chris, OTR/L Acute Rehabilitation Services [phone] [facility] pager [phone] office 11/29/2018  [place] 11/29/2018, 11:37 AM

## 2018-11-29 NOTE — Progress Notes (Signed)
Physical Therapy Treatment Patient Details Name: Kerri [name] MRN: [id number] DOB: [birth year] Today's Date: 11/29/2018    History of Present Illness Pt s/p L THR revision and with hx of back surgery and CAD    PT Comments    Pt with marked improvement in activity tolerance but continues impulsive and requiring cues for compliance with posterior THP.   Follow Up Recommendations  Follow surgeon's recommendation for DC plan and follow-up therapies     Equipment Recommendations  Rolling walker with 5" wheels    Recommendations for Other Services OT consult     Precautions / Restrictions Precautions Precautions: Posterior Hip;Fall Precaution Booklet Issued: Yes (comment) Restrictions Weight Bearing Restrictions: No Other Position/Activity Restrictions: WBAT    Mobility  Bed Mobility Overal bed mobility: Needs Assistance Bed Mobility: Supine to Sit     Supine to sit: Min assist     General bed mobility comments: to avoid IR  Transfers Overall transfer level: Needs assistance Equipment used: Rolling walker (2 wheeled) Transfers: Sit to/from Stand Sit to Stand: Min guard         General transfer comment: cues for hand placement. Pt did advance LLE for sit to stand without cues  Ambulation/Gait Ambulation/Gait assistance: Min assist Gait Distance (Feet): 120 Feet(and 20' into bathroom) Assistive device: Rolling walker (2 wheeled) Gait Pattern/deviations: Step-to pattern;Decreased step length - right;Decreased step length - left;Shuffle;Trunk flexed Gait velocity: decr   General Gait Details: cues for sequence, posture and position from [employer]             Wheelchair Mobility    Modified Rankin (Stroke Patients Only)       Balance Overall balance assessment: Needs assistance Sitting-balance support: No upper extremity supported;Feet supported Sitting balance-Leahy Scale: Good     Standing balance support: Bilateral upper extremity  supported Standing balance-Leahy Scale: Fair                              Cognition Arousal/Alertness: Awake/alert Behavior During Therapy: Impulsive Overall Cognitive Status: No family/caregiver present to determine baseline cognitive functioning                                 General Comments: decreased memory for precautions and pt loses her place in conversation      Exercises Total Joint Exercises Ankle Circles/Pumps: AROM;Both;20 reps;Supine Quad Sets: AROM;Both;10 reps;Supine Heel Slides: AAROM;Left;20 reps;Supine Hip ABduction/ADduction: AAROM;Left;15 reps;Supine    General Comments        Pertinent Vitals/Pain Pain Assessment: Faces Faces Pain Scale: Hurts a little bit Pain Location: L hip Pain Descriptors / Indicators: Aching;Sore Pain Intervention(s): Limited activity within patient's tolerance;Monitored during session;Premedicated before session;Ice applied    Home Living Family/patient expects to be discharged to:: Private residence Living Arrangements: Alone           Home Equipment: Shower seat;Toilet riser      Prior Function Level of Independence: Independent;Independent with assistive device(s)      Comments: plans to hire assistance. Daughter cannot help due to fibromyalgia   PT Goals (current goals can now be found in the care plan section) Acute Rehab PT Goals Patient Stated Goal: RegaiN IND PT Goal Formulation: With patient Time For Goal Achievement: 12/12/18 Potential to Achieve Goals: Good Progress towards PT goals: Progressing toward goals    Frequency    7X/week  PT Plan Current plan remains appropriate    Co-evaluation              AM-PAC PT "6 Clicks" Mobility   Outcome Measure  Help needed turning from your back to your side while in a flat bed without using bedrails?: A Little Help needed moving from lying on your back to sitting on the side of a flat bed without using bedrails?: A  Little Help needed moving to and from a bed to a chair (including a wheelchair)?: A Little Help needed standing up from a chair using your arms (e.g., wheelchair or bedside chair)?: A Little Help needed to walk in hospital room?: A Little Help needed climbing 3-5 steps with a railing? : A Lot 6 Click Score: 17    End of Session Equipment Utilized During Treatment: Gait belt Activity Tolerance: Patient tolerated treatment well Patient left: in chair;with call bell/phone within reach;with nursing/sitter in room;with chair alarm set Nurse Communication: Mobility status PT Visit Diagnosis: Difficulty in walking, not elsewhere classified (R26.2)     Time: 1010-1100 PT Time Calculation (min) (ACUTE ONLY): 50 min  Charges:  $Gait Training: 8-22 mins $Therapeutic Exercise: 8-22 mins $Therapeutic Activity: 8-22 mins                     [place] Pager [phone] Office [phone]    Solene Hereford 11/29/2018, 11:55 AM

## 2018-11-29 NOTE — Plan of Care (Signed)
Plan of care reviewed and discussed with the patient. 

## 2018-11-29 NOTE — Progress Notes (Signed)
Physical Therapy Treatment Patient Details Name: Kerri [name] MRN: [id number] DOB: [birth year] Today's Date: 11/29/2018    History of Present Illness Pt s/p L THR revision and with hx of back surgery and CAD    PT Comments    Pt continues to progress with mobility but continues impulsive and requiring cues to follow THP.   Follow Up Recommendations  Follow surgeon's recommendation for DC plan and follow-up therapies     Equipment Recommendations  Rolling walker with 5" wheels    Recommendations for Other Services OT consult     Precautions / Restrictions Precautions Precautions: Posterior Hip;Fall Precaution Booklet Issued: Yes (comment) Precaution Comments: Pt recalls 1/3 THP without cues Restrictions Weight Bearing Restrictions: No Other Position/Activity Restrictions: WBAT    Mobility  Bed Mobility Overal bed mobility: Needs Assistance Bed Mobility: Sit to Supine     Supine to sit: Min assist Sit to supine: Min assist   General bed mobility comments: cues for sequence and adherence to THP; assist to manage L LE  Transfers Overall transfer level: Needs assistance Equipment used: Rolling walker (2 wheeled) Transfers: Sit to/from Stand Sit to Stand: Min guard         General transfer comment: cues for hand placement. Pt did advance LLE for sit to stand without cues  Ambulation/Gait Ambulation/Gait assistance: Min assist;Min guard Gait Distance (Feet): 140 Feet Assistive device: Rolling walker (2 wheeled) Gait Pattern/deviations: Step-to pattern;Decreased step length - right;Decreased step length - left;Shuffle;Trunk flexed Gait velocity: decr   General Gait Details: cues for sequence, posture, ER on L and position from [employer] Mobility    Modified Rankin (Stroke Patients Only)       Balance Overall balance assessment: Needs assistance Sitting-balance support: No upper extremity supported;Feet supported Sitting  balance-Leahy Scale: Good     Standing balance support: Bilateral upper extremity supported Standing balance-Leahy Scale: Fair                              Cognition Arousal/Alertness: Awake/alert Behavior During Therapy: Impulsive Overall Cognitive Status: No family/caregiver present to determine baseline cognitive functioning                                 General Comments: decreased memory for precautions and pt loses her place in conversation      Exercises Total Joint Exercises Ankle Circles/Pumps: AROM;Both;20 reps;Supine Quad Sets: AROM;Both;10 reps;Supine Heel Slides: AAROM;Left;20 reps;Supine Hip ABduction/ADduction: AAROM;Left;15 reps;Supine    General Comments        Pertinent Vitals/Pain Pain Assessment: 0-10 Pain Score: 5  Faces Pain Scale: Hurts a little bit Pain Location: L hip Pain Descriptors / Indicators: Aching;Sore Pain Intervention(s): Limited activity within patient's tolerance;Monitored during session;Premedicated before session;Patient requesting pain meds-RN notified    Home Living                      Prior Function            PT Goals (current goals can now be found in the care plan section) Acute Rehab PT Goals Patient Stated Goal: RegaiN IND PT Goal Formulation: With patient Time For Goal Achievement: 12/12/18 Potential to Achieve Goals: Good Progress towards PT goals: Progressing toward goals    Frequency    7X/week  PT Plan Current plan remains appropriate    Co-evaluation              AM-PAC PT "6 Clicks" Mobility   Outcome Measure  Help needed turning from your back to your side while in a flat bed without using bedrails?: A Little Help needed moving from lying on your back to sitting on the side of a flat bed without using bedrails?: A Little Help needed moving to and from a bed to a chair (including a wheelchair)?: A Little Help needed standing up from a chair using your  arms (e.g., wheelchair or bedside chair)?: A Little Help needed to walk in hospital room?: A Little Help needed climbing 3-5 steps with a railing? : A Lot 6 Click Score: 17    End of Session Equipment Utilized During Treatment: Gait belt Activity Tolerance: Patient tolerated treatment well Patient left: in bed;with call bell/phone within reach Nurse Communication: Mobility status PT Visit Diagnosis: Difficulty in walking, not elsewhere classified (R26.2)     Time: 8563-1497 PT Time Calculation (min) (ACUTE ONLY): 43 min  Charges:  $Gait Training: 23-37 mins $Therapeutic Exercise: 8-22 mins $Therapeutic Activity: 8-22 mins                     [place] Pager [phone] Office [phone]    Tyrik Stetzer 11/29/2018, 3:04 PM

## 2018-11-30 LAB — CBC
HCT: 28.6 % — ABNORMAL LOW (ref 36.0–46.0)
Hemoglobin: 9.1 g/dL — ABNORMAL LOW (ref 12.0–15.0)
MCH: 32.6 pg (ref 26.0–34.0)
MCHC: 31.8 g/dL (ref 30.0–36.0)
MCV: 102.5 fL — ABNORMAL HIGH (ref 80.0–100.0)
Platelets: 155 10*3/uL (ref 150–400)
RBC: 2.79 MIL/uL — ABNORMAL LOW (ref 3.87–5.11)
RDW: 12.3 % (ref 11.5–15.5)
WBC: 5.8 10*3/uL (ref 4.0–10.5)
nRBC: 0 % (ref 0.0–0.2)

## 2018-11-30 MED ORDER — TAPENTADOL HCL 50 MG PO TABS
50.0000 mg | ORAL_TABLET | Freq: Four times a day (QID) | ORAL | Status: DC | PRN
  Administered 2018-11-30 – 2018-12-01 (×3): 50 mg via ORAL
  Filled 2018-11-30: qty 1
  Filled 2018-11-30: qty 2
  Filled 2018-11-30: qty 1

## 2018-11-30 MED ORDER — HYDROCODONE-ACETAMINOPHEN 5-325 MG PO TABS: 1.0000 | ORAL_TABLET | ORAL | Status: DC | PRN

## 2018-11-30 NOTE — Progress Notes (Signed)
Physical Therapy Treatment Patient Details Name: Kerri [name] MRN: [id number] DOB: [birth year] Today's Date: 11/30/2018    History of Present Illness Pt s/p L THR revision and with hx of back surgery and CAD    PT Comments    Pt continues cooperative and family present for family ed including THP, tranfers, bed mobility, and stairs.    Follow Up Recommendations  Follow surgeon's recommendation for DC plan and follow-up therapies     Equipment Recommendations  Rolling walker with 5" wheels    Recommendations for Other Services OT consult     Precautions / Restrictions Precautions Precautions: Posterior Hip;Fall Precaution Comments: educated family in hip precautions  Restrictions Weight Bearing Restrictions: No Other Position/Activity Restrictions: WBAT    Mobility  Bed Mobility Overal bed mobility: Needs Assistance Bed Mobility: Supine to Sit       Sit to supine: Min assist   General bed mobility comments: assist for LEs with cues for adherence to THP  Transfers Overall transfer level: Needs assistance Equipment used: Rolling walker (2 wheeled) Transfers: Sit to/from Stand Sit to Stand: Min guard;Supervision         General transfer comment: no physical assist, self cues technique  Ambulation/Gait Ambulation/Gait assistance: Min guard Gait Distance (Feet): 60 Feet(and 20' into bathroom) Assistive device: Rolling walker (2 wheeled) Gait Pattern/deviations: Step-to pattern;Decreased step length - right;Decreased step length - left;Shuffle;Trunk flexed Gait velocity: decr   General Gait Details: cues for sequence, posture, ER on L and position from RW; distance ltd by c/o dizziness - BP 115/64   Stairs Stairs: Yes Stairs assistance: Min assist Stair Management: One rail Right;Forwards;With cane;Step to pattern Number of Stairs: 2 General stair comments: cues for sequence and foot/cane placement   Wheelchair Mobility    Modified Rankin (Stroke Patients  Only)       Balance Overall balance assessment: Needs assistance Sitting-balance support: No upper extremity supported;Feet supported Sitting balance-Leahy Scale: Good     Standing balance support: Bilateral upper extremity supported Standing balance-Leahy Scale: Fair                              Cognition Arousal/Alertness: Awake/alert Behavior During Therapy: Impulsive                                   General Comments: able to recall L LE placement during sit<>stand      Exercises      General Comments        Pertinent Vitals/Pain Pain Assessment: Faces Faces Pain Scale: Hurts even more Pain Location: L hip Pain Descriptors / Indicators: Aching;Sore;Moaning Pain Intervention(s): Limited activity within patient's tolerance;Monitored during session;Premedicated before session;Ice applied    Home Living                      Prior Function            PT Goals (current goals can now be found in the care plan section) Acute Rehab PT Goals Patient Stated Goal: regain independence PT Goal Formulation: With patient Time For Goal Achievement: 12/12/18 Potential to Achieve Goals: Good Progress towards PT goals: Progressing toward goals    Frequency    7X/week      PT Plan Current plan remains appropriate    Co-evaluation              AM-PAC PT "  6 Clicks" Mobility   Outcome Measure  Help needed turning from your back to your side while in a flat bed without using bedrails?: A Little Help needed moving from lying on your back to sitting on the side of a flat bed without using bedrails?: A Little Help needed moving to and from a bed to a chair (including a wheelchair)?: A Little Help needed standing up from a chair using your arms (e.g., wheelchair or bedside chair)?: A Little Help needed to walk in hospital room?: A Little Help needed climbing 3-5 steps with a railing? : A Little 6 Click Score: 18    End of  Session Equipment Utilized During Treatment: Gait belt Activity Tolerance: Patient tolerated treatment well;Patient limited by fatigue Patient left: in bed;with call bell/phone within reach;with family/visitor present;with bed alarm set Nurse Communication: Mobility status PT Visit Diagnosis: Difficulty in walking, not elsewhere classified (R26.2)     Time: 1405-1500 PT Time Calculation (min) (ACUTE ONLY): 55 min  Charges:  $Gait Training: 8-22 mins $Therapeutic Activity: 23-37 mins                     Kerri [name] Pager [phone] Office [phone]    Kerri [name] 11/30/2018, 6:04 PM

## 2018-11-30 NOTE — Progress Notes (Signed)
Pt seen for second visit for family/caregiver education. Will continue to follow acutely.   11/30/18 1400  OT Visit Information  Last OT Received On 11/30/18  Assistance Needed +1  History of Present Illness Pt s/p L THR revision and with hx of back surgery and CAD  Precautions  Precautions Posterior Hip;Fall  Precaution Comments educated family in hip precautions   Pain Assessment  Pain Assessment Faces  Faces Pain Scale 6  Pain Location L hip  Pain Descriptors / Indicators Aching;Sore;Moaning  Pain Intervention(s) Monitored during session  Cognition  Arousal/Alertness Awake/alert  Behavior During Therapy Impulsive  General Comments able to recall L LE placement during sit<>stand  ADL  Overall ADL's  Needs assistance/impaired  Grooming Min guard;Wash/dry hands;Standing  Armed forces technical officer Ambulation;BSC;RW;Min guard  Writer and Hygiene Minimal assistance;Sit to/from stand  Toileting - Clothing Manipulation Details (indicate cue type and reason) assist to pull up pants (tight fitting)  Functional mobility during ADLs Min guard;Rolling walker  General ADL Comments daughter and caregiver educated in use of AE, daughter has a Secondary school teacher, sock aid and will check to see if pt's long handle sponge is long enough  Bed Mobility  General bed mobility comments pt received on toilet and returned to EOB  Balance  Overall balance assessment Needs assistance  Sitting balance-Leahy Scale Good  Standing balance-Leahy Scale Fair  Standing balance comment statically at sink  Restrictions  Weight Bearing Restrictions No  Other Position/Activity Restrictions WBAT  Transfers  Equipment used Rolling walker (2 wheeled)  Transfers Sit to/from Stand  Sit to Stand Min guard  General transfer comment no physical assist, self cues technique  OT - End of Session  Equipment Utilized During Treatment Gait belt;Rolling walker  Activity Tolerance Patient tolerated treatment well   Patient left in bed;with call bell/phone within reach;with family/visitor present  OT Assessment/Plan  OT Plan Discharge plan remains appropriate  OT Visit Diagnosis Pain;Unsteadiness on feet (R26.81);Other abnormalities of gait and mobility (R26.89)  Pain - Right/Left Left  Pain - part of body Hip  OT Frequency (ACUTE ONLY) Min 2X/week  Follow Up Recommendations Home health OT;Supervision/Assistance - 24 hour  OT Equipment None recommended by OT  AM-PAC OT "6 Clicks" Daily Activity Outcome Measure (Version 2)  Help from another person eating meals? 4  Help from another person taking care of personal grooming? 3  Help from another person toileting, which includes using toliet, bedpan, or urinal? 3  Help from another person bathing (including washing, rinsing, drying)? 2  Help from another person to put on and taking off regular upper body clothing? 4  Help from another person to put on and taking off regular lower body clothing? 2  6 Click Score 18  OT Goal Progression  Progress towards OT goals Progressing toward goals  Acute Rehab OT Goals  Patient Stated Goal regain independence  OT Goal Formulation With patient  Time For Goal Achievement 12/13/18  Potential to Achieve Goals Good  OT Time Calculation  OT Start Time (ACUTE ONLY) 1420  OT Stop Time (ACUTE ONLY) 1432  OT Time Calculation (min) 12 min  OT General Charges  $OT Visit 1 Visit  OT Treatments  $Self Care/Home Management  8-22 mins  Nestor Lewandowsky, OTR/L Acute Rehabilitation Services Pager: [phone] Office: [phone]

## 2018-11-30 NOTE — Progress Notes (Signed)
Subjective: 3 Days Post-Op Procedure(s) (LRB): Left hip femoral revision (Left) Patient reports pain as 1 on 0-10 scale.    Objective: Vital signs in last 24 hours: Temp:  [97.5 F (36.4 C)] 97.5 F (36.4 C) (07/10 2302) Pulse Rate:  [81-82] 82 (07/11 0610) Resp:  [14-16] 14 (07/11 0610) BP: (106-111)/(59-63) 106/59 (07/11 0610) SpO2:  [91 %-95 %] 91 % (07/11 0610)  Intake/Output from previous day: 07/10 0701 - 07/11 0700 In: 480 [P.O.:480] Out: 575 [Urine:575] Intake/Output this shift: No intake/output data recorded.  Recent Labs    11/28/18 0247 11/29/18 0231 11/30/18 0352  HGB 11.1* 10.0* 9.1*   Recent Labs    11/29/18 0231 11/30/18 0352  WBC 10.5 5.8  RBC 3.14* 2.79*  HCT 31.2* 28.6*  PLT 171 155   Recent Labs    11/28/18 0247 11/29/18 0231  NA 134* 135  K 3.8 3.2*  CL 100 99  CO2 25 26  BUN 12 11  CREATININE 0.64 0.64  GLUCOSE 172* 119*  CALCIUM 8.4* 8.4*   No results for input(s): LABPT, INR in the last 72 hours.  Neurologically intact   Assessment/Plan: 3 Days Post-Op Procedure(s) (LRB): Left hip femoral revision (Left) Up with therapy.Discharge      Latanya Maudlin 11/30/2018, 8:28 AM

## 2018-11-30 NOTE — Progress Notes (Signed)
Occupational Therapy Treatment Patient Details Name: Kerri [name] MRN: [id number] DOB: 04/27/1930 Today's Date: 11/30/2018    History of present illness Pt s/p L THR revision and with hx of back surgery and CAD   OT comments  Focus of session on reinforcing hip precautions during mobility and LB ADL. Instructed in use of AE. Pt likely to need reinforcement due to memory deficits, updated d/c recommendation to [place].  Follow Up Recommendations  Home health OT;Supervision/Assistance - 24 hour    Equipment Recommendations  None recommended by OT    Recommendations for Other Services      Precautions / Restrictions Precautions Precautions: Posterior Hip;Fall Precaution Booklet Issued: Yes (comment) Precaution Comments: reviewed hip precautions with pt Restrictions Other Position/Activity Restrictions: WBAT       Mobility Bed Mobility               General bed mobility comments: pt received in chair  Transfers Overall transfer level: Needs assistance Equipment used: Rolling walker (2 wheeled) Transfers: Sit to/from Stand Sit to Stand: Min guard         General transfer comment: no physical assist, verbal cues for technique    Balance Overall balance assessment: Needs assistance   Sitting balance-Leahy Scale: Good       Standing balance-Leahy Scale: Fair Standing balance comment: statically at sink                           ADL either performed or assessed with clinical judgement   ADL Overall ADL's : Needs assistance/impaired     Grooming: Min guard;Standing;Wash/dry hands         Lower Body Bathing Details (indicate cue type and reason): educated in use of long handled bath sponge     Lower Body Dressing: Moderate assistance;Sit to/from stand;With adaptive equipment Lower Body Dressing Details (indicate cue type and reason): educated in use of reacher, sock aid, long handled shoe horn and elastic shoe laces Toilet Transfer: Min  guard;Ambulation;BSC;RW   Toileting- Water quality scientist and Hygiene: Min guard;Sit to/from stand       Functional mobility during ADLs: Min guard;Rolling walker General ADL Comments: cues for L LE positioning in chair     Vision       Perception     Praxis      Cognition Arousal/Alertness: Awake/alert Behavior During Therapy: Impulsive Overall Cognitive Status: No family/caregiver present to determine baseline cognitive functioning                                 General Comments: decreased memory, difficulty staying on topic--internally distracted        Exercises     Shoulder Instructions       General Comments      Pertinent Vitals/ Pain       Pain Assessment: Faces Faces Pain Scale: Hurts a little bit Pain Location: L hip Pain Descriptors / Indicators: Aching;Sore Pain Intervention(s): Monitored during session;Repositioned  Home Living                                          Prior Functioning/Environment              Frequency  Min 2X/week        Progress Toward Goals  OT Goals(current goals can  now be found in the care plan section)  Progress towards OT goals: Progressing toward goals  Acute Rehab OT Goals Patient Stated Goal: regain independence OT Goal Formulation: With patient Time For Goal Achievement: 12/13/18 Potential to Achieve Goals: Good  Plan Discharge plan needs to be updated    Co-evaluation                 AM-PAC OT "6 Clicks" Daily Activity     Outcome Measure   Help from another person eating meals?: None Help from another person taking care of personal grooming?: A Little Help from another person toileting, which includes using toliet, bedpan, or urinal?: A Little Help from another person bathing (including washing, rinsing, drying)?: A Lot Help from another person to put on and taking off regular upper body clothing?: None Help from another person to put on and taking  off regular lower body clothing?: A Lot 6 Click Score: 18    End of Session Equipment Utilized During Treatment: Gait belt;Rolling walker  OT Visit Diagnosis: Pain Pain - Right/Left: Left Pain - part of body: Hip   Activity Tolerance Patient tolerated treatment well   Patient Left in chair;with call bell/phone within reach;with chair alarm set   Nurse Communication          Time: 9826-4158 OT Time Calculation (min): 23 min  Charges: OT General Charges $OT Visit: 1 Visit OT Treatments $Self Care/Home Management : 23-37 mins  Nestor Lewandowsky, OTR/L Acute Rehabilitation Services Pager: [phone] Office: [phone]   Malka So 11/30/2018, 1:10 PM

## 2018-11-30 NOTE — Progress Notes (Signed)
Physical Therapy Treatment Patient Details Name: Kerri [name] MRN: [id number] DOB: [birth year] Today's Date: 11/30/2018    History of Present Illness Pt s/p L THR revision and with hx of back surgery and CAD    PT Comments    Pt continues cooperative but impulsive and highly distractible.  This session pt negotiated stairs with cane and rail and constant cues for sequence and focus on task.  Pt ambulated limited distance in hall 2* c/o fatigue and dizziness - BP 115/64.  Dtr expected this pm for family ed.   Follow Up Recommendations  Follow surgeon's recommendation for DC plan and follow-up therapies     Equipment Recommendations  Rolling walker with 5" wheels    Recommendations for Other Services OT consult     Precautions / Restrictions Precautions Precautions: Posterior Hip;Fall Precaution Booklet Issued: Yes (comment) Precaution Comments: Pt recalls 1/3 THP without cues.  Reviewed hip precautions with pt Restrictions Weight Bearing Restrictions: No Other Position/Activity Restrictions: WBAT    Mobility  Bed Mobility               General bed mobility comments: Pt up in chair and requests back to same  Transfers Overall transfer level: Needs assistance Equipment used: Rolling walker (2 wheeled) Transfers: Sit to/from Stand Sit to Stand: Min guard;Supervision         General transfer comment: no physical assist, verbal cues for technique  Ambulation/Gait Ambulation/Gait assistance: Min guard Gait Distance (Feet): 30 Feet Assistive device: Rolling walker (2 wheeled) Gait Pattern/deviations: Step-to pattern;Decreased step length - right;Decreased step length - left;Shuffle;Trunk flexed Gait velocity: decr   General Gait Details: cues for sequence, posture, ER on L and position from RW; distance ltd by c/o dizziness - BP 115/64   Stairs Stairs: Yes Stairs assistance: Min assist Stair Management: One rail Right;Forwards;With cane;Step to pattern Number of  Stairs: 4 General stair comments: cues for sequence and foot/cane placement   Wheelchair Mobility    Modified Rankin (Stroke Patients Only)       Balance Overall balance assessment: Needs assistance Sitting-balance support: No upper extremity supported;Feet supported Sitting balance-Leahy Scale: Good       Standing balance-Leahy Scale: Fair Standing balance comment: statically at sink                            Cognition Arousal/Alertness: Awake/alert Behavior During Therapy: Impulsive Overall Cognitive Status: No family/caregiver present to determine baseline cognitive functioning                                 General Comments: decreased memory, difficulty staying on topic--internally distracted      Exercises      General Comments        Pertinent Vitals/Pain Pain Assessment: Faces Faces Pain Scale: Hurts little more Pain Location: L hip Pain Descriptors / Indicators: Aching;Sore Pain Intervention(s): Limited activity within patient's tolerance;Monitored during session;Premedicated before session;Ice applied    Home Living                      Prior Function            PT Goals (current goals can now be found in the care plan section) Acute Rehab PT Goals Patient Stated Goal: regain independence PT Goal Formulation: With patient Time For Goal Achievement: 12/12/18 Potential to Achieve Goals: Good Progress towards PT goals: Not  progressing toward goals - comment(c/o dizziness)    Frequency    7X/week      PT Plan Current plan remains appropriate    Co-evaluation              AM-PAC PT "6 Clicks" Mobility   Outcome Measure  Help needed turning from your back to your side while in a flat bed without using bedrails?: A Little Help needed moving from lying on your back to sitting on the side of a flat bed without using bedrails?: A Little Help needed moving to and from a bed to a chair (including a  wheelchair)?: A Little Help needed standing up from a chair using your arms (e.g., wheelchair or bedside chair)?: A Little Help needed to walk in hospital room?: A Little Help needed climbing 3-5 steps with a railing? : A Lot 6 Click Score: 17    End of Session Equipment Utilized During Treatment: Gait belt Activity Tolerance: Patient limited by fatigue Patient left: in chair;with call bell/phone within reach;with chair alarm set Nurse Communication: Mobility status PT Visit Diagnosis: Difficulty in walking, not elsewhere classified (R26.2)     Time: 0600-4599 PT Time Calculation (min) (ACUTE ONLY): 28 min  Charges:  $Gait Training: 8-22 mins $Therapeutic Activity: 8-22 mins                     [place] Pager [phone] Office [phone]    Simranjit Thayer 11/30/2018, 1:22 PM

## 2018-12-01 NOTE — Progress Notes (Signed)
Occupational Therapy Treatment Patient Details Name: Kerri [name] MRN: [id number] DOB: Sep 24, 1929 Today's Date: 12/01/2018    History of present illness Pt s/p L THR revision and with hx of back surgery and CAD   OT comments  Pt toileted, stood at sink for 2 grooming tasks and ambulated to chair with supervision. Reinforced hip precautions during LB ADL. Pt needing cues for management of L LE during sit<>stand. Pt is eager to go home.   Follow Up Recommendations  Home health OT;Supervision/Assistance - 24 hour    Equipment Recommendations  None recommended by OT    Recommendations for Other Services      Precautions / Restrictions Precautions Precautions: Posterior Hip;Fall Precaution Comments: pt able to state 3/3 hip precautions, verbal cues needed during sit<>stand Restrictions Other Position/Activity Restrictions: WBAT       Mobility Bed Mobility               General bed mobility comments: pt received in bathroom  Transfers Overall transfer level: Needs assistance Equipment used: Rolling walker (2 wheeled) Transfers: Sit to/from Stand Sit to Stand: Supervision         General transfer comment: cues for LE management with sit<>stand    Balance                                           ADL either performed or assessed with clinical judgement   ADL Overall ADL's : Needs assistance/impaired     Grooming: Oral care;Wash/dry hands;Standing;Supervision/safety               Lower Body Dressing: Minimal assistance;Sitting/lateral leans Lower Body Dressing Details (indicate cue type and reason): reinforced use of AE Toilet Transfer: Supervision/safety;Ambulation;RW;BSC   Toileting- Water quality scientist and Hygiene: Supervision/safety;Sit to/from stand Toileting - Clothing Manipulation Details (indicate cue type and reason): pt managing underwear without assist     Functional mobility during ADLs: Supervision/safety;Rolling walker        Vision       Perception     Praxis      Cognition Arousal/Alertness: Awake/alert Behavior During Therapy: Impulsive Overall Cognitive Status: No family/caregiver present to determine baseline cognitive functioning                                 General Comments: continues to be distracted by home concerns which are being handled by her daughter        Exercises     Shoulder Instructions       General Comments      Pertinent Vitals/ Pain       Pain Assessment: Faces Faces Pain Scale: Hurts a little bit Pain Location: L hip Pain Descriptors / Indicators: Sore Pain Intervention(s): Monitored during session;Ice applied  Home Living                                          Prior Functioning/Environment              Frequency  Min 2X/week        Progress Toward Goals  OT Goals(current goals can now be found in the care plan section)  Progress towards OT goals: Progressing toward goals  Acute Rehab OT Goals Patient Stated  Goal: regain independence OT Goal Formulation: With patient Time For Goal Achievement: 12/13/18 Potential to Achieve Goals: Good  Plan Discharge plan remains appropriate    Co-evaluation                 AM-PAC OT "6 Clicks" Daily Activity     Outcome Measure   Help from another person eating meals?: None Help from another person taking care of personal grooming?: A Little Help from another person toileting, which includes using toliet, bedpan, or urinal?: A Little Help from another person bathing (including washing, rinsing, drying)?: A Little Help from another person to put on and taking off regular upper body clothing?: None Help from another person to put on and taking off regular lower body clothing?: A Little 6 Click Score: 20    End of Session Equipment Utilized During Treatment: Gait belt;Rolling walker  OT Visit Diagnosis: Pain;Unsteadiness on feet (R26.81);Other  abnormalities of gait and mobility (R26.89) Pain - Right/Left: Left Pain - part of body: Hip   Activity Tolerance Patient tolerated treatment well   Patient Left in chair;with call bell/phone within reach;with chair alarm set   Nurse Communication          Time: 3817-7116 OT Time Calculation (min): 25 min  Charges: OT General Charges $OT Visit: 1 Visit OT Treatments $Self Care/Home Management : 23-37 mins  Nestor Lewandowsky, OTR/L Acute Rehabilitation Services Pager: [phone] Office: [phone]   Kerri [name] 12/01/2018, 9:30 AM

## 2018-12-01 NOTE — Discharge Summary (Signed)
Orthopedic Discharge Summary        Physician Discharge Summary  Patient ID: Kerri [name] MRN: [id number] DOB/AGE: [birth year] 85 y.o.  Admit date: 11/27/2018 Discharge date: 12/01/2018   Procedures:  Procedure(s) (LRB): Left hip femoral revision (Left)  Attending Physician:  Dr. Hector Shade  Admission Diagnoses:   Left hip pain   Discharge Diagnoses: same   Past Medical History:  Diagnosis Date  . Adenomatous colon polyp   . Asthma   . CAD (coronary artery disease)    Chest CTA 10/07: 40% or less pLAD;  ETT 8/09: negative  . Carotid bruit   . Chronic back pain   . Depression   . Diverticulosis   . DJD (degenerative joint disease)   . Dyslipidemia   . Dysrhythmia    pvcs  . Fatty liver   . GERD (gastroesophageal reflux disease)   . HTN (hypertension)   . Hypothyroidism   . IBS (irritable bowel syndrome)   . Internal hemorrhoid   . Melanoma ([place])    metastatic; left leg; s/p multiple excisions  . Migraine   . Osteoporosis   . Spinal stenosis     PCP: Lavone Orn, MD   Discharged Condition: good  Hospital Course:  Patient underwent the above stated procedure on 11/27/2018. Patient tolerated the procedure well and brought to the recovery room in good condition and subsequently to the floor. Patient had an uncomplicated hospital course and was stable for discharge.   Disposition: Discharge disposition: 01-Home or Self Care      with follow up in 2 weeks   Follow-up Information    Gaynelle Arabian, MD. Schedule an appointment as soon as possible for a visit on 12/12/2018.   Specialty: Orthopedic Surgery Contact information: [address] [place] [address] [phone]           Discharge Instructions    Call MD / Call 911   Complete by: As directed    If you experience chest pain or shortness of breath, CALL 911 and be transported to the hospital emergency room.  If you develope a fever above 101 F, pus (white drainage) or  increased drainage or redness at the wound, or calf pain, call your surgeon's office.   Call MD / Call 911   Complete by: As directed    If you experience chest pain or shortness of breath, CALL 911 and be transported to the hospital emergency room.  If you develope a fever above 101 F, pus (white drainage) or increased drainage or redness at the wound, or calf pain, call your surgeon's office.   Call MD / Call 911   Complete by: As directed    If you experience chest pain or shortness of breath, CALL 911 and be transported to the hospital emergency room.  If you develope a fever above 101 F, pus (white drainage) or increased drainage or redness at the wound, or calf pain, call your surgeon's office.   Change dressing   Complete by: As directed    Change the dressing daily with sterile 4 x 4 inch gauze dressing and paper tape.   Constipation Prevention   Complete by: As directed    Drink plenty of fluids.  Prune juice may be helpful.  You may use a stool softener, such as Colace (over the counter) 100 mg twice a day.  Use MiraLax (over the counter) for constipation as needed.   Constipation Prevention   Complete by: As directed  Drink plenty of fluids.  Prune juice may be helpful.  You may use a stool softener, such as Colace (over the counter) 100 mg twice a day.  Use MiraLax (over the counter) for constipation as needed.   Constipation Prevention   Complete by: As directed    Drink plenty of fluids.  Prune juice may be helpful.  You may use a stool softener, such as Colace (over the counter) 100 mg twice a day.  Use MiraLax (over the counter) for constipation as needed.   Diet - low sodium heart healthy   Complete by: As directed    Diet general   Complete by: As directed    Follow the hip precautions as taught in Physical Therapy   Complete by: As directed    Increase activity slowly as tolerated   Complete by: As directed    Increase activity slowly as tolerated   Complete by: As  directed    Weight bearing as tolerated   Complete by: As directed       Allergies as of 12/01/2018      Reactions   Buprenorphine Hcl Itching, Other (See Comments)   Happened a while back. Patient noted that any narcotic based medication makes her have a bad reaction.   Codeine Nausea And Vomiting, Other (See Comments)   Patient gets sick on stomach   Morphine And Related Itching, Other (See Comments)   Happened a while back. Patient noted that any narcotic based medication makes her have a bad reaction.   Tramadol Nausea And Vomiting, Other (See Comments)   Confusion Can tolerate one tablet at night   Adhesive [tape] Other (See Comments)   Tears skin   Hydrocodone-acetaminophen Nausea And Vomiting   Lactose Intolerance (gi) Other (See Comments)   Gi upset   Oxycodone Nausea And Vomiting      Medication List    STOP taking these medications   traMADol 50 MG tablet Commonly known as: ULTRAM     TAKE these medications   acetaminophen 500 MG tablet Commonly known as: TYLENOL Take 500 mg by mouth daily.   aspirin 325 MG EC tablet Take 1 tablet (325 mg total) by mouth 2 (two) times daily for 18 days. Then take one 81 mg aspirin once a day for three weeks. Then discontinue aspirin. Notes to patient: 11/30/2018   Biotin 5 MG Caps Take 5 mg by mouth daily.   calcium carbonate 500 MG chewable tablet Commonly known as: TUMS - dosed in mg elemental calcium Chew 2 tablets by mouth daily.   chlorthalidone 25 MG tablet Commonly known as: HYGROTON Take 1 tablet (25 mg total) by mouth daily. Only takes if bp over 710 systolic What changed:   when to take this  reasons to take this  additional instructions   esomeprazole 20 MG capsule Commonly known as: NEXIUM Take 20 mg by mouth daily.   fluticasone 50 MCG/ACT nasal spray Commonly known as: FLONASE Place 1 spray into both nostrils daily. Notes to patient: 12/01/2018   gabapentin 300 MG capsule Commonly known as:  NEURONTIN Take 300 mg by mouth 2 (two) times daily. Notes to patient: 11/30/2018   levothyroxine 75 MCG tablet Commonly known as: SYNTHROID Take 75 mcg by mouth daily with breakfast. Notes to patient: 12/01/2018   lidocaine 5 % Commonly known as: LIDODERM Place 1 patch onto the skin daily as needed (for leg pain). Remove & Discard patch within 12 hours or as directed by MD   loratadine  10 MG tablet Commonly known as: CLARITIN Take 10 mg by mouth daily. Notes to patient: 12/01/2018   Magnesium 250 MG Tabs Take 250 mg by mouth daily.   Melatonin 10 MG Caps Take 10 mg by mouth at bedtime.   methocarbamol 500 MG tablet Commonly known as: ROBAXIN Take 1 tablet (500 mg total) by mouth every 6 (six) hours as needed for muscle spasms.   MiraLax 17 GM/SCOOP powder Generic drug: polyethylene glycol powder Take 17 g by mouth at bedtime. Notes to patient: 11/30/2018   potassium chloride 10 MEQ tablet Commonly known as: K-DUR Take 10 mEq by mouth daily as needed (Only takes when taking Chlorthalidone).   Prevagen Extra Strength 20 MG Caps Generic drug: Apoaequorin Take 20 mg by mouth daily.   PROBIOTIC PO Take 1 capsule by mouth daily.   Retin-A 0.05 % cream Generic drug: tretinoin Apply 1 application topically every other day.   rosuvastatin 10 MG tablet Commonly known as: CRESTOR Take 10 mg by mouth at bedtime.   Systane Balance 0.6 % Soln Generic drug: Propylene Glycol Place 1 drop into both eyes daily.   tapentadol 50 MG tablet Commonly known as: NUCYNTA Take 1-2 tablets (50-100 mg total) by mouth every 6 (six) hours as needed for moderate pain or severe pain.   Vitamin D3 50 MCG (2000 UT) Tabs Take 2,000 Units by mouth daily.            Durable Medical Equipment  (From admission, onward)         Start     Ordered   11/28/18 1138  For home use only DME Walker rolling  Once    Question:  Patient needs a walker to treat with the following condition  Answer:   History of total hip arthroplasty, left   11/28/18 1138           Discharge Care Instructions  (From admission, onward)         Start     Ordered   11/28/18 0000  Weight bearing as tolerated     11/28/18 0808   11/28/18 0000  Change dressing    Comments: Change the dressing daily with sterile 4 x 4 inch gauze dressing and paper tape.   11/28/18 1610            Signed: Augustin Schooling 12/01/2018, 9:10 AM  [facility] [address], [place], [place],  [address] Phone: [facility]

## 2018-12-01 NOTE — Progress Notes (Signed)
Physical Therapy Treatment Patient Details Name: Kerri [name] MRN: [id number] DOB: 29-Nov-1929 Today's Date: 12/01/2018    History of Present Illness Pt s/p L THR revision and with hx of back surgery and CAD    PT Comments    Pt performed home therex program and ambulated to bathroom where OT took over session.   Follow Up Recommendations  Follow surgeon's recommendation for DC plan and follow-up therapies     Equipment Recommendations  Rolling walker with 5" wheels    Recommendations for Other Services OT consult     Precautions / Restrictions Precautions Precautions: Posterior Hip;Fall Precaution Comments: pt able to state 3/3 hip precautions, verbal cues needed during sit<>stand Restrictions Weight Bearing Restrictions: No Other Position/Activity Restrictions: WBAT    Mobility  Bed Mobility Overal bed mobility: Needs Assistance Bed Mobility: Supine to Sit     Supine to sit: Supervision     General bed mobility comments: cues to avoid rolling and adhere to THP  Transfers Overall transfer level: Needs assistance Equipment used: Rolling walker (2 wheeled) Transfers: Sit to/from Stand Sit to Stand: Supervision         General transfer comment: min cues for LE management with sit<>stand  Ambulation/Gait Ambulation/Gait assistance: Min guard;Supervision Gait Distance (Feet): 20 Feet Assistive device: Rolling walker (2 wheeled) Gait Pattern/deviations: Step-to pattern;Decreased step length - right;Decreased step length - left;Shuffle;Trunk flexed Gait velocity: decr   General Gait Details: cues for sequence, posture, ER on L and position from [employer] Mobility    Modified Rankin (Stroke Patients Only)       Balance Overall balance assessment: Needs assistance Sitting-balance support: No upper extremity supported;Feet supported Sitting balance-Leahy Scale: Good     Standing balance support: Bilateral upper extremity  supported Standing balance-Leahy Scale: Fair                              Cognition Arousal/Alertness: Awake/alert Behavior During Therapy: Impulsive Overall Cognitive Status: No family/caregiver present to determine baseline cognitive functioning                                 General Comments: continues to be distracted by home concerns which are being handled by her daughter      Exercises Total Joint Exercises Ankle Circles/Pumps: AROM;Both;20 reps;Supine Quad Sets: AROM;Both;10 reps;Supine Heel Slides: AAROM;Left;20 reps;Supine Hip ABduction/ADduction: AAROM;Left;15 reps;Supine    General Comments        Pertinent Vitals/Pain Pain Assessment: Faces Faces Pain Scale: Hurts a little bit Pain Location: L hip Pain Descriptors / Indicators: Sore Pain Intervention(s): Limited activity within patient's tolerance;Monitored during session;Premedicated before session    Home Living                      Prior Function            PT Goals (current goals can now be found in the care plan section) Acute Rehab PT Goals Patient Stated Goal: regain independence PT Goal Formulation: With patient Time For Goal Achievement: 12/12/18 Potential to Achieve Goals: Good Progress towards PT goals: Progressing toward goals    Frequency    7X/week      PT Plan Current plan remains appropriate    Co-evaluation  AM-PAC PT "6 Clicks" Mobility   Outcome Measure  Help needed turning from your back to your side while in a flat bed without using bedrails?: A Little Help needed moving from lying on your back to sitting on the side of a flat bed without using bedrails?: A Little Help needed moving to and from a bed to a chair (including a wheelchair)?: A Little Help needed standing up from a chair using your arms (e.g., wheelchair or bedside chair)?: A Little Help needed to walk in hospital room?: A Little Help needed climbing 3-5  steps with a railing? : A Little 6 Click Score: 18    End of Session Equipment Utilized During Treatment: Gait belt Activity Tolerance: Patient tolerated treatment well Patient left: Other (comment)(bathroom ) Nurse Communication: Mobility status PT Visit Diagnosis: Difficulty in walking, not elsewhere classified (R26.2)     Time: 8250-0370 PT Time Calculation (min) (ACUTE ONLY): 25 min  Charges:  $Gait Training: 8-22 mins $Therapeutic Exercise: 8-22 mins                     [place] Pager [phone] Office [phone]    Syrus Nakama 12/01/2018, 12:04 PM

## 2018-12-01 NOTE — Progress Notes (Signed)
Physical Therapy Treatment Patient Details Name: Kerri [name] MRN: [id number] DOB: 06-03-1929 Today's Date: 12/01/2018    History of Present Illness Pt s/p L THR revision and with hx of back surgery and CAD    PT Comments    Pt progressing steadily and mobilizing at [place] assist.   Follow Up Recommendations  Follow surgeon's recommendation for DC plan and follow-up therapies     Equipment Recommendations  Rolling walker with 5" wheels    Recommendations for Other Services OT consult     Precautions / Restrictions Precautions Precautions: Posterior Hip;Fall Precaution Booklet Issued: Yes (comment) Precaution Comments: pt able to state 3/3 hip precautions, verbal cues needed during sit<>stand Restrictions Weight Bearing Restrictions: No Other Position/Activity Restrictions: WBAT    Mobility  Bed Mobility Overal bed mobility: Needs Assistance Bed Mobility: Supine to Sit     Supine to sit: Supervision     General bed mobility comments: cues to avoid rolling and adhere to THP  Transfers Overall transfer level: Needs assistance Equipment used: Rolling walker (2 wheeled) Transfers: Sit to/from Stand Sit to Stand: Supervision         General transfer comment: min cues for LE management with sit<>stand  Ambulation/Gait Ambulation/Gait assistance: Min guard;Supervision Gait Distance (Feet): 150 Feet Assistive device: Rolling walker (2 wheeled) Gait Pattern/deviations: Step-to pattern;Decreased step length - right;Decreased step length - left;Shuffle;Trunk flexed Gait velocity: decr   General Gait Details: cues for sequence, posture, ER on L and position from [employer] Mobility    Modified Rankin (Stroke Patients Only)       Balance Overall balance assessment: Needs assistance Sitting-balance support: No upper extremity supported;Feet supported Sitting balance-Leahy Scale: Good     Standing balance support: Bilateral  upper extremity supported Standing balance-Leahy Scale: Fair                              Cognition Arousal/Alertness: Awake/alert Behavior During Therapy: Impulsive Overall Cognitive Status: No family/caregiver present to determine baseline cognitive functioning                                 General Comments: continues to be distracted by home concerns which are being handled by her daughter      Exercises Total Joint Exercises Ankle Circles/Pumps: AROM;Both;20 reps;Supine Quad Sets: AROM;Both;10 reps;Supine Heel Slides: AAROM;Left;20 reps;Supine Hip ABduction/ADduction: AAROM;Left;15 reps;Supine    General Comments        Pertinent Vitals/Pain Pain Assessment: Faces Faces Pain Scale: Hurts a little bit Pain Location: L hip Pain Descriptors / Indicators: Sore Pain Intervention(s): Limited activity within patient's tolerance;Monitored during session;Premedicated before session;Ice applied    Home Living                      Prior Function            PT Goals (current goals can now be found in the care plan section) Acute Rehab PT Goals Patient Stated Goal: regain independence PT Goal Formulation: With patient Time For Goal Achievement: 12/12/18 Potential to Achieve Goals: Good Progress towards PT goals: Progressing toward goals    Frequency    7X/week      PT Plan Current plan remains appropriate    Co-evaluation  AM-PAC PT "6 Clicks" Mobility   Outcome Measure  Help needed turning from your back to your side while in a flat bed without using bedrails?: A Little Help needed moving from lying on your back to sitting on the side of a flat bed without using bedrails?: A Little Help needed moving to and from a bed to a chair (including a wheelchair)?: A Little Help needed standing up from a chair using your arms (e.g., wheelchair or bedside chair)?: A Little Help needed to walk in hospital room?: A  Little Help needed climbing 3-5 steps with a railing? : A Little 6 Click Score: 18    End of Session Equipment Utilized During Treatment: Gait belt Activity Tolerance: Patient tolerated treatment well Patient left: in chair;with chair alarm set Nurse Communication: Mobility status PT Visit Diagnosis: Difficulty in walking, not elsewhere classified (R26.2)     Time: 3016-0109 PT Time Calculation (min) (ACUTE ONLY): 26 min  Charges:  $Gait Training: 23-37 mins $Therapeutic Exercise: 8-22 mins                     [place] Pager [phone] Office [phone]    Naomia Lenderman 12/01/2018, 12:11 PM

## 2018-12-01 NOTE — Progress Notes (Signed)
Orthopedics Progress Note  Subjective: Feeling better today.  She would like to go home  Objective:  Vitals:   12/01/18 0214 12/01/18 0606  BP: 135/68 (!) 107/56  Pulse: 70 76  Resp: 16 16  Temp: 99.1 F (37.3 C) 98.1 F (36.7 C)  SpO2: 90% 95%    General: Awake and alert  Musculoskeletal: left hip dressing looks good with some mild spotting Minimal swelling in the leg. Neg [place] Neurovascularly intact  Lab Results  Component Value Date   WBC 5.8 11/30/2018   HGB 9.1 (L) 11/30/2018   HCT 28.6 (L) 11/30/2018   MCV 102.5 (H) 11/30/2018   PLT 155 11/30/2018       Component Value Date/Time   NA 135 11/29/2018 0231   NA 140 07/19/2017 1351   K 3.2 (L) 11/29/2018 0231   CL 99 11/29/2018 0231   CO2 26 11/29/2018 0231   GLUCOSE 119 (H) 11/29/2018 0231   BUN 11 11/29/2018 0231   BUN 18 07/19/2017 1351   CREATININE 0.64 11/29/2018 0231   CALCIUM 8.4 (L) 11/29/2018 0231   GFRNONAA >60 11/29/2018 0231   GFRAA >60 11/29/2018 0231    Lab Results  Component Value Date   INR 1.0 11/21/2018   INR 0.97 05/16/2012   INR 1.02 09/01/2011    Assessment/Plan: POD #3 s/p Procedure(s): Left hip femoral revision Discharge to home after therapy today  Remo Lipps R. Veverly Fells, MD 12/01/2018 9:06 AM

## 2018-12-03 DIAGNOSIS — L723 Sebaceous cyst: Secondary | ICD-10-CM | POA: Diagnosis not present

## 2018-12-03 DIAGNOSIS — K219 Gastro-esophageal reflux disease without esophagitis: Secondary | ICD-10-CM | POA: Diagnosis not present

## 2018-12-03 DIAGNOSIS — I10 Essential (primary) hypertension: Secondary | ICD-10-CM | POA: Diagnosis not present

## 2018-12-03 DIAGNOSIS — Z86010 Personal history of colonic polyps: Secondary | ICD-10-CM | POA: Diagnosis not present

## 2018-12-03 DIAGNOSIS — C44721 Squamous cell carcinoma of skin of unspecified lower limb, including hip: Secondary | ICD-10-CM | POA: Diagnosis not present

## 2018-12-03 DIAGNOSIS — I2510 Atherosclerotic heart disease of native coronary artery without angina pectoris: Secondary | ICD-10-CM | POA: Diagnosis not present

## 2018-12-03 DIAGNOSIS — M810 Age-related osteoporosis without current pathological fracture: Secondary | ICD-10-CM | POA: Diagnosis not present

## 2018-12-03 DIAGNOSIS — T84031D Mechanical loosening of internal left hip prosthetic joint, subsequent encounter: Secondary | ICD-10-CM | POA: Diagnosis not present

## 2018-12-03 DIAGNOSIS — G43909 Migraine, unspecified, not intractable, without status migrainosus: Secondary | ICD-10-CM | POA: Diagnosis not present

## 2018-12-03 DIAGNOSIS — E785 Hyperlipidemia, unspecified: Secondary | ICD-10-CM | POA: Diagnosis not present

## 2018-12-03 DIAGNOSIS — F329 Major depressive disorder, single episode, unspecified: Secondary | ICD-10-CM | POA: Diagnosis not present

## 2018-12-03 DIAGNOSIS — M48062 Spinal stenosis, lumbar region with neurogenic claudication: Secondary | ICD-10-CM | POA: Diagnosis not present

## 2018-12-03 DIAGNOSIS — Z85820 Personal history of malignant melanoma of skin: Secondary | ICD-10-CM | POA: Diagnosis not present

## 2018-12-03 DIAGNOSIS — K589 Irritable bowel syndrome without diarrhea: Secondary | ICD-10-CM | POA: Diagnosis not present

## 2018-12-03 DIAGNOSIS — J45909 Unspecified asthma, uncomplicated: Secondary | ICD-10-CM | POA: Diagnosis not present

## 2018-12-03 DIAGNOSIS — E039 Hypothyroidism, unspecified: Secondary | ICD-10-CM | POA: Diagnosis not present

## 2018-12-06 ENCOUNTER — Encounter (HOSPITAL_COMMUNITY): Payer: Self-pay

## 2018-12-06 ENCOUNTER — Emergency Department (HOSPITAL_COMMUNITY)
Admission: EM | Admit: 2018-12-06 | Discharge: 2018-12-07 | Disposition: A | Discharge status: Home / Self Care | Payer: Medicare Other | Attending: Emergency Medicine | Admitting: Emergency Medicine

## 2018-12-06 ENCOUNTER — Emergency Department (HOSPITAL_COMMUNITY): Payer: Medicare Other

## 2018-12-06 ENCOUNTER — Other Ambulatory Visit: Payer: Self-pay

## 2018-12-06 DIAGNOSIS — Z85828 Personal history of other malignant neoplasm of skin: Secondary | ICD-10-CM | POA: Insufficient documentation

## 2018-12-06 DIAGNOSIS — E876 Hypokalemia: Secondary | ICD-10-CM | POA: Diagnosis not present

## 2018-12-06 DIAGNOSIS — Z96642 Presence of left artificial hip joint: Secondary | ICD-10-CM | POA: Insufficient documentation

## 2018-12-06 DIAGNOSIS — Z209 Contact with and (suspected) exposure to unspecified communicable disease: Secondary | ICD-10-CM | POA: Diagnosis not present

## 2018-12-06 DIAGNOSIS — Z87891 Personal history of nicotine dependence: Secondary | ICD-10-CM | POA: Insufficient documentation

## 2018-12-06 DIAGNOSIS — N3000 Acute cystitis without hematuria: Secondary | ICD-10-CM | POA: Diagnosis not present

## 2018-12-06 DIAGNOSIS — Z79899 Other long term (current) drug therapy: Secondary | ICD-10-CM | POA: Diagnosis not present

## 2018-12-06 DIAGNOSIS — E039 Hypothyroidism, unspecified: Secondary | ICD-10-CM | POA: Insufficient documentation

## 2018-12-06 DIAGNOSIS — R05 Cough: Secondary | ICD-10-CM | POA: Diagnosis not present

## 2018-12-06 DIAGNOSIS — R51 Headache: Secondary | ICD-10-CM | POA: Diagnosis not present

## 2018-12-06 DIAGNOSIS — G4489 Other headache syndrome: Secondary | ICD-10-CM | POA: Insufficient documentation

## 2018-12-06 DIAGNOSIS — R42 Dizziness and giddiness: Secondary | ICD-10-CM | POA: Diagnosis not present

## 2018-12-06 DIAGNOSIS — H538 Other visual disturbances: Secondary | ICD-10-CM | POA: Diagnosis not present

## 2018-12-06 DIAGNOSIS — R52 Pain, unspecified: Secondary | ICD-10-CM | POA: Diagnosis not present

## 2018-12-06 DIAGNOSIS — H5789 Other specified disorders of eye and adnexa: Secondary | ICD-10-CM | POA: Insufficient documentation

## 2018-12-06 DIAGNOSIS — I10 Essential (primary) hypertension: Secondary | ICD-10-CM | POA: Insufficient documentation

## 2018-12-06 DIAGNOSIS — R509 Fever, unspecified: Secondary | ICD-10-CM | POA: Insufficient documentation

## 2018-12-06 LAB — CBC WITH DIFFERENTIAL/PLATELET
Abs Immature Granulocytes: 0.17 10*3/uL — ABNORMAL HIGH (ref 0.00–0.07)
Basophils Absolute: 0 10*3/uL (ref 0.0–0.1)
Basophils Relative: 0 %
Eosinophils Absolute: 0.1 10*3/uL (ref 0.0–0.5)
Eosinophils Relative: 1 %
HCT: 35.6 % — ABNORMAL LOW (ref 36.0–46.0)
Hemoglobin: 11.7 g/dL — ABNORMAL LOW (ref 12.0–15.0)
Immature Granulocytes: 2 %
Lymphocytes Relative: 19 %
Lymphs Abs: 1.8 10*3/uL (ref 0.7–4.0)
MCH: 31.8 pg (ref 26.0–34.0)
MCHC: 32.9 g/dL (ref 30.0–36.0)
MCV: 96.7 fL (ref 80.0–100.0)
Monocytes Absolute: 0.9 10*3/uL (ref 0.1–1.0)
Monocytes Relative: 9 %
Neutro Abs: 6.4 10*3/uL (ref 1.7–7.7)
Neutrophils Relative %: 69 %
Platelets: 355 10*3/uL (ref 150–400)
RBC: 3.68 MIL/uL — ABNORMAL LOW (ref 3.87–5.11)
RDW: 12.3 % (ref 11.5–15.5)
WBC: 9.4 10*3/uL (ref 4.0–10.5)
nRBC: 0 % (ref 0.0–0.2)

## 2018-12-06 MED ORDER — DIPHENHYDRAMINE HCL 50 MG/ML IJ SOLN
25.0000 mg | Freq: Once | INTRAMUSCULAR | Status: AC
  Administered 2018-12-06: 25 mg via INTRAVENOUS
  Filled 2018-12-06: qty 1

## 2018-12-06 MED ORDER — METOCLOPRAMIDE HCL 5 MG/ML IJ SOLN
10.0000 mg | Freq: Once | INTRAMUSCULAR | Status: AC
  Administered 2018-12-06: 10 mg via INTRAVENOUS
  Filled 2018-12-06: qty 2

## 2018-12-06 NOTE — ED Triage Notes (Signed)
Pt BIB [facility] from home. Pt c/o migraines x4 days in her right eye, light sensitivity , low grade fever, dizziness x7 days, and dry cough x2 weeks and trouble swallowing x. Pt had left hip replacement on 7/8. Pt is requesting urine be tested for UTI. Pt was tested for COVID for surgery and it was negative.   Pt denies CP or SHOB.

## 2018-12-06 NOTE — ED Provider Notes (Signed)
[facility] DEPT Provider Note   CSN: [id number] Arrival date & time: 12/06/18  2158     History   Chief Complaint Chief Complaint  Patient presents with  . Migraine  . Dizziness    HPI Kerri [name] is a 85 y.o. female.     The history is provided by the patient.  Headache Pain location:  R parietal Quality:  Stabbing Onset quality:  Gradual Timing:  Intermittent Progression:  Worsening Chronicity:  New Similar to prior headaches: yes   Context: bright light   Relieved by:  Nothing Worsened by:  Light Associated symptoms: cough, eye pain, fever and photophobia   Associated symptoms: no abdominal pain, no blurred vision, no focal weakness, no neck stiffness, no vomiting and no weakness    Patient with history of CAD, hypertension presents with headache.  She reports for the past 4 days she has had intermittent severe right-sided headaches involving her eye.  Denies visual changes.  No focal weakness.  She reported fever as well as cough at home for past several days.  Reports dry mouth.  She is concerned she has Urinary tract infection.  He has had headache before, but this 1 lasted longer. No falls or head injuries. Patient underwent left hip revision on July 12. She reports cough started after her surgery Past Medical History:  Diagnosis Date  . Adenomatous colon polyp   . Asthma   . CAD (coronary artery disease)    Chest CTA 10/07: 40% or less pLAD;  ETT 8/09: negative  . Carotid bruit   . Chronic back pain   . Depression   . Diverticulosis   . DJD (degenerative joint disease)   . Dyslipidemia   . Dysrhythmia    pvcs  . Fatty liver   . GERD (gastroesophageal reflux disease)   . HTN (hypertension)   . Hypothyroidism   . IBS (irritable bowel syndrome)   . Internal hemorrhoid   . Melanoma ([place])    metastatic; left leg; s/p multiple excisions  . Migraine   . Osteoporosis   . Spinal stenosis     Patient Active Problem List   Diagnosis Date Noted  . Failed total hip arthroplasty ([place]) 11/27/2018  . Melanoma ([place])   . Dizziness 01/30/2017  . Lumbar stenosis with neurogenic claudication 11/13/2013  . Sebaceous cyst 11/20/2012  . Squamous cell carcinoma in situ of skin of calf 04/03/2012  . Postop Hypokalemia 09/08/2011  . Postop Acute blood loss anemia 09/05/2011  . Osteoarthritis of hip 09/04/2011  . Melanoma of lower leg ([place]) 05/03/2011  . PVC's (premature ventricular contractions) 07/30/2009  . HYPOTHYROIDISM 05/10/2009  . DYSLIPIDEMIA 05/10/2009  . Essential hypertension 05/10/2009  . CAD 05/10/2009  . DEGENERATIVE JOINT DISEASE 05/10/2009  . SPINAL STENOSIS 05/10/2009  . CAROTID BRUIT 05/10/2009    Past Surgical History:  Procedure Laterality Date  . BACK SURGERY    . BLEPHAROPLASTY    . BUNIONECTOMY WITH HAMMERTOE RECONSTRUCTION Right 01/16/2013   Procedure: RIGHT Barbie Banner OSTEOTOMY WITH SIMPLE BUNIONECTOMY AND SECOND HAMMER TOE CORRECTION;  Surgeon: Wylene Simmer, MD;  Location: [facility];  Service: Orthopedics;  Laterality: Right;  . CARPAL TUNNEL RELEASE    . KNEE ARTHROSCOPY Right   . LUMBAR LAMINECTOMY/DECOMPRESSION MICRODISCECTOMY N/A 11/13/2013   Procedure: LUMBAR TWO-THREE,LUMBAR THREE-FOUR,LUMBAR FOUR-FIVE LAMINECTOMY AND FORAMINOTOMY;  Surgeon: Ophelia Charter, MD;  Location: [facility];  Service: Neurosurgery;  Laterality: N/A;  . MELANOMA EXCISION Left 09/19/2012   Procedure: WIDE EXCISION  METASTATIC MELANOMA LEFT MEDIAL THIGH;  Surgeon: Zenovia Jarred, MD;  Location: [facility];  Service: General;  Laterality: Left;  . ROTATOR CUFF REPAIR    . SHOULDER SURGERY    . TOE SURGERY    . TOTAL HIP ARTHROPLASTY  09/04/2011   Procedure: TOTAL HIP ARTHROPLASTY;  Surgeon: Gearlean Alf, MD;  Location: [facility];  Service: Orthopedics;  Laterality: Left;  . TOTAL HIP REVISION Left 11/27/2018   Procedure: Left hip femoral revision;  Surgeon: Gaynelle Arabian, MD;   Location: [facility];  Service: Orthopedics;  Laterality: Left;  171min     OB History   No obstetric history on file.      Home Medications    Prior to Admission medications   Medication Sig Start Date End Date Taking? Authorizing Provider  acetaminophen (TYLENOL) 500 MG tablet Take 500 mg by mouth daily.   Yes [provider]  alendronate (FOSAMAX) 70 MG tablet Take 70 mg by mouth every 7 (seven) days. 11/15/18  Yes [provider]  Apoaequorin (PREVAGEN EXTRA STRENGTH) 20 MG CAPS Take 20 mg by mouth daily.   Yes [provider]  aspirin EC 325 MG EC tablet Take 1 tablet (325 mg total) by mouth 2 (two) times daily for 18 days. Then take one 81 mg aspirin once a day for three weeks. Then discontinue aspirin. 11/29/18 12/17/18 Yes Edmisten, Kristie L, PA  Biotin 5 MG CAPS Take 5 mg by mouth daily.   Yes [provider]  calcium carbonate (TUMS - DOSED IN MG ELEMENTAL CALCIUM) 500 MG chewable tablet Chew 2 tablets by mouth daily.   Yes [provider]  chlorthalidone (HYGROTON) 25 MG tablet Take 1 tablet (25 mg total) by mouth daily. Only takes if bp over 427 systolic Patient taking differently: Take 25 mg by mouth daily as needed (only if BP > 062 systolic).  03/19/18  Yes Minus Breeding, MD  Cholecalciferol (VITAMIN D3) 50 MCG (2000 UT) TABS Take 2,000 Units by mouth daily.   Yes [provider]  esomeprazole (NEXIUM) 20 MG capsule Take 20 mg by mouth daily.    Yes [provider]  fluticasone (FLONASE) 50 MCG/ACT nasal spray Place 1 spray into both nostrils daily.  04/08/17  Yes [provider]  gabapentin (NEURONTIN) 300 MG capsule Take 300 mg by mouth 2 (two) times daily.    Yes [provider]  levothyroxine (SYNTHROID, LEVOTHROID) 75 MCG tablet Take 75 mcg by mouth daily with breakfast.   Yes [provider]  lidocaine (LIDODERM) 5 % Place 1 patch onto the skin daily as needed (for leg pain). Remove &  Discard patch within 12 hours or as directed by MD   Yes [provider]  loratadine (CLARITIN) 10 MG tablet Take 10 mg by mouth daily.    Yes [provider]  Magnesium 250 MG TABS Take 250 mg by mouth daily.    Yes [provider]  Melatonin 10 MG CAPS Take 10 mg by mouth at bedtime.   Yes [provider]  methocarbamol (ROBAXIN) 500 MG tablet Take 1 tablet (500 mg total) by mouth every 6 (six) hours as needed for muscle spasms. 11/29/18  Yes Edmisten, Kristie L, PA  polyethylene glycol powder (MIRALAX) 17 GM/SCOOP powder Take 17 g by mouth at bedtime.    Yes [provider]  potassium chloride (K-DUR,KLOR-CON) 10 MEQ tablet Take 10 mEq by mouth daily as needed (Only takes when taking Chlorthalidone).  03/27/17  Yes [provider]  Probiotic Product (PROBIOTIC PO) Take 1 capsule by mouth daily.   Yes [provider]  Propylene Glycol (SYSTANE BALANCE) 0.6 % SOLN Place 1 drop into both eyes daily.   Yes [provider]  rosuvastatin (CRESTOR) 10 MG tablet Take 10 mg by mouth at bedtime.    Yes [provider]  tapentadol (NUCYNTA) 50 MG tablet Take 1-2 tablets (50-100 mg total) by mouth every 6 (six) hours as needed for moderate pain or severe pain. 11/29/18  Yes Edmisten, Kristie L, PA  tretinoin (RETIN-A) 0.05 % cream Apply 1 application topically every other day.    Yes [provider]    Family History Family History  Problem Relation Age of Onset  . CAD Mother   . CAD Father   . Kidney disease Child     Social History Social History   Tobacco Use  . Smoking status: Former Smoker    Types: Cigarettes    Quit date: 08/31/1948    Years since quitting: 70.3  . Smokeless tobacco: Never Used  Substance Use Topics  . Alcohol use: Yes  . Drug use: No     Allergies   Buprenorphine hcl, Codeine, Morphine and related, Tramadol, Adhesive [tape], Hydrocodone-acetaminophen, Lactose intolerance (gi),  and Oxycodone   Review of Systems Review of Systems  Constitutional: Positive for fever.  Eyes: Positive for photophobia and pain. Negative for blurred vision and visual disturbance.  Respiratory: Positive for cough.   Gastrointestinal: Negative for abdominal pain and vomiting.  Musculoskeletal: Negative for neck stiffness.  Neurological: Positive for headaches. Negative for focal weakness, speech difficulty and weakness.  All other systems reviewed and are negative.    Physical Exam Updated Vital Signs BP 103/71   Pulse 71   Temp 98 F (36.7 C) (Oral)   Resp 16   SpO2 95%   Physical Exam  CONSTITUTIONAL: Elderly, resting comfortably HEAD: Normocephalic/atraumatic, no obvious rash noted to forehead.  Patient refused to take her hat off to examine the rest of her head. EYES: EOMI/PERRL, no proptosis, no hyphema, no corneal haziness.  Pupils are equal/reactive bilaterally.  No consensual pain. No ptosis ENMT: Mucous membranes moist NECK: supple no meningeal signs, no loud bruits CV: S1/S2 noted, no murmurs/rubs/gallops noted LUNGS: Lungs are clear to auscultation bilaterally, no apparent distress ABDOMEN: soft, nontender, no rebound or guarding GU:no cva tenderness NEURO:Awake/alert, face symmetric, no arm or leg drift is noted Equal 5/5 strength with shoulder abduction, elbow flex/extension, wrist flex/extension in upper extremities and equal hand grips bilaterally Cranial nerves 3/4/5/6/11/27/08/11/12 tested and intact No past pointing Sensation to light touch intact in all extremities EXTREMITIES: pulses normal, full ROM SKIN: warm, color normal PSYCH: mildly anxious  ED Treatments / Results  Labs (all labs ordered are listed, but only abnormal results are displayed) Labs Reviewed  BASIC METABOLIC PANEL - Abnormal; Notable for the following components:      Result Value   Potassium 2.7 (*)    Glucose, Bld 113 (*)    All other components within normal limits  CBC  WITH DIFFERENTIAL/PLATELET - Abnormal; Notable for the following components:   RBC 3.68 (*)    Hemoglobin 11.7 (*)    HCT 35.6 (*)    Abs Immature Granulocytes 0.17 (*)    All other components within normal limits  SEDIMENTATION RATE - Abnormal; Notable for the following components:   Sed Rate 71 (*)    All other components within normal limits  URINALYSIS, ROUTINE W REFLEX MICROSCOPIC - Abnormal; Notable for the following components:   APPearance HAZY (*)    Hgb urine dipstick SMALL (*)    Nitrite POSITIVE (*)    Leukocytes,Ua LARGE (*)    WBC, UA >50 (*)    Bacteria, UA MANY (*)    All other components within normal limits  URINE CULTURE    EKG EKG Interpretation  Date/Time:  Saturday December 07 2018 01:28:27 EDT Ventricular Rate:  76 PR Interval:    QRS Duration: 86 QT Interval:  432 QTC Calculation: 486 R Axis:   28 Text Interpretation:  Sinus or ectopic atrial rhythm Ventricular premature complex Borderline short PR interval Abnormal R-wave progression, early transition Borderline T abnormalities, anterior leads Borderline prolonged QT interval Interpretation limited secondary to artifact Confirmed by Ripley Fraise [phone]) on 12/07/2018 1:36:04 AM   Radiology Ct Head Wo Contrast  Result Date: 12/07/2018 CLINICAL DATA:  Headache, acute, normal neuro exam EXAM: CT HEAD WITHOUT CONTRAST TECHNIQUE: Contiguous axial images were obtained from the base of the skull through the vertex without intravenous contrast. COMPARISON:  Brain MRI 06/20/2016, orbital CT 02/08/2015 FINDINGS: Brain: No intracranial hemorrhage, mass effect, or midline shift. Age related atrophy. Mild chronic small vessel ischemia. No hydrocephalus. The basilar cisterns are patent. No evidence of territorial infarct or acute ischemia. No extra-axial or intracranial fluid collection. Vascular: Atherosclerosis of skullbase vasculature without hyperdense vessel or abnormal calcification. Skull: No fracture or focal  lesion. Sinuses/Orbits: Opacification of right sphenoid sinus with surrounding sclerosis, unchanged from prior exam consistent with chronic sinusitis. Scattered mucosal thickening of ethmoid air cells. Bilateral cataract resection. Mastoid air cells are clear. Other: None. IMPRESSION: 1. No acute intracranial abnormality. 2. Age related atrophy and chronic small vessel ischemia. 3. Chronic right sphenoid sinusitis. Electronically Signed   By: Keith Rake M.D.   On: 12/07/2018 01:21   Dg Chest Port 1 View  Result Date: 12/06/2018 CLINICAL DATA:  Initial evaluation for acute cough. EXAM: PORTABLE CHEST 1 VIEW COMPARISON:  Prior radiograph from 12/12/2014. FINDINGS: Transverse heart size within normal limits. Mediastinal silhouette normal. Aortic atherosclerosis with mild tortuosity the intrathoracic aorta. Lungs mildly hypoinflated. No focal infiltrates. No edema or effusion. No pneumothorax. No acute osseous finding. IMPRESSION: 1. No active cardiopulmonary disease. 2. Aortic atherosclerosis. Electronically Signed   By: Jeannine Boga M.D.   On: 12/06/2018 23:40    Procedures Procedures   Medications Ordered in ED Medications  magnesium sulfate IVPB 1 g 100 mL (1 g Intravenous New Bag/Given 12/07/18 0236)  predniSONE (DELTASONE) tablet 60 mg (has no administration in time range)  metoCLOPramide (REGLAN) injection 10 mg (10 mg Intravenous Given 12/06/18 2340)  diphenhydrAMINE (BENADRYL) injection 25 mg (25 mg Intravenous Given 12/06/18 2342)  potassium chloride SA (K-DUR) CR tablet 40 mEq (40 mEq Oral Given 12/07/18 0145)  potassium chloride 10 mEq in 100 mL IVPB (0 mEq Intravenous Stopped 12/07/18 0236)  cephALEXin (KEFLEX) capsule 500 mg (500 mg Oral Given 12/07/18 0148)  fentaNYL (SUBLIMAZE) injection 50 mcg (50 mcg Intravenous Given 12/07/18 0304)     Initial Impression / Assessment and Plan / ED Course  I have reviewed the triage vital signs and the nursing notes.  Pertinent labs &  imaging results that were available during my care of the patient were reviewed by me and considered in my medical decision making (see chart for details).        11:45 PM Patient presents with multiple complaints including headache, dry cough, eye pain.  Denies visual disturbances.  No signs of glaucoma Temporal arteritis is high on the differential.  We will proceed with labs and CT head. Patient also mentions cough, will obtain chest x-ray. She adamantly refuses COVID-19 testing.  She reports she has been tested twice before and it was negative. No focal weakness No meningeal signs noted 11:51 PM Discussed with her daughter.  She reports the patient has had these exact same headaches in the past, they usually occur every 2 to 3 years. Daughter has tried treating these at home without improvement. We discussed plan, we will keep her updated. 12:24 AM Patient feeling improved.  She denies any visual loss.  No facial droop, no facial rash. Awaiting imaging at this time BP 136/75   Pulse 71   Temp 98 F (36.7 C) (Oral)   Resp 16   SpO2 95%  1:36 AM CT head negative.  Patient seen walking. Patient currently hypokalemic, this will be treated.  Also there is  Evidence of UTI 3:09 AM Patient improved.  Her sed rate is elevated.  Due to location of headache, eye pain and elevated sed rate in an 85 year old female, will treat for presumptive temporal arteritis.  She has been started on steroids.  I discussed this extensively with her daughter, and she reports the patient has been on steroids previously.  Daughter will call her neurologist in 2 days, she has also been given referral information to ophthalmology for potential temporal artery biopsy  Discussed the need to take potassium each day for next 3 days.  She will be continued on Keflex for UTI. No signs of sepsis.  No signs of acute neurologic emergency.  Patient is safe for discharge Final Clinical Impressions(s) / ED Diagnoses    Final diagnoses:  Acute cystitis without hematuria  Other headache syndrome  Hypokalemia    ED Discharge Orders         Ordered    predniSONE (DELTASONE) 50 MG tablet  Daily with breakfast     12/07/18 0307    cephALEXin (KEFLEX) 500 MG capsule  3 times daily     12/07/18 3810           Ripley Fraise, MD 12/07/18 [phone]

## 2018-12-07 ENCOUNTER — Other Ambulatory Visit (HOSPITAL_COMMUNITY): Payer: Medicare Other

## 2018-12-07 ENCOUNTER — Emergency Department (HOSPITAL_COMMUNITY): Payer: Medicare Other

## 2018-12-07 DIAGNOSIS — R51 Headache: Secondary | ICD-10-CM | POA: Diagnosis not present

## 2018-12-07 LAB — BASIC METABOLIC PANEL
Anion gap: 10 (ref 5–15)
BUN: 15 mg/dL (ref 8–23)
CO2: 29 mmol/L (ref 22–32)
Calcium: 9 mg/dL (ref 8.9–10.3)
Chloride: 101 mmol/L (ref 98–111)
Creatinine, Ser: 0.61 mg/dL (ref 0.44–1.00)
GFR calc Af Amer: >60 mL/min (ref >60)
GFR calc non Af Amer: >60 mL/min (ref >60)
Glucose, Bld: 113 mg/dL — ABNORMAL HIGH (ref 70–99)
Potassium: 2.7 mmol/L — CL (ref 3.5–5.1)
Sodium: 140 mmol/L (ref 135–145)

## 2018-12-07 LAB — SEDIMENTATION RATE: Sed Rate: 71 mm/hr — ABNORMAL HIGH (ref 0–22)

## 2018-12-07 LAB — URINALYSIS, ROUTINE W REFLEX MICROSCOPIC
Bilirubin Urine: NEGATIVE
Glucose, UA: NEGATIVE mg/dL
Ketones, ur: NEGATIVE mg/dL
Nitrite: POSITIVE — AB
Protein, ur: NEGATIVE mg/dL
Specific Gravity, Urine: 1.018 (ref 1.005–1.030)
WBC, UA: >50 WBC/hpf — ABNORMAL HIGH (ref 0–5)
pH: 7 (ref 5.0–8.0)

## 2018-12-07 MED ORDER — MAGNESIUM SULFATE 50 % IJ SOLN: 1.0000 g | Freq: Once | INTRAMUSCULAR | Status: DC

## 2018-12-07 MED ORDER — CEPHALEXIN 500 MG PO CAPS
500.0000 mg | ORAL_CAPSULE | Freq: Once | ORAL | Status: AC
  Administered 2018-12-07: 500 mg via ORAL
  Filled 2018-12-07: qty 1

## 2018-12-07 MED ORDER — PREDNISONE 50 MG PO TABS: 50.0000 mg | ORAL_TABLET | Freq: Every day | ORAL | 0 refills | Status: DC

## 2018-12-07 MED ORDER — FENTANYL CITRATE (PF) 100 MCG/2ML IJ SOLN
50.0000 ug | Freq: Once | INTRAMUSCULAR | Status: AC
  Administered 2018-12-07: 50 ug via INTRAVENOUS
  Filled 2018-12-07: qty 2

## 2018-12-07 MED ORDER — MAGNESIUM SULFATE IN D5W 1-5 GM/100ML-% IV SOLN
1.0000 g | Freq: Once | INTRAVENOUS | Status: AC
  Administered 2018-12-07: 1 g via INTRAVENOUS
  Filled 2018-12-07: qty 100

## 2018-12-07 MED ORDER — POTASSIUM CHLORIDE CRYS ER 20 MEQ PO TBCR
40.0000 meq | EXTENDED_RELEASE_TABLET | Freq: Once | ORAL | Status: AC
  Administered 2018-12-07: 40 meq via ORAL
  Filled 2018-12-07: qty 2

## 2018-12-07 MED ORDER — POTASSIUM CHLORIDE 10 MEQ/100ML IV SOLN
10.0000 meq | Freq: Once | INTRAVENOUS | Status: AC
  Administered 2018-12-07: 10 meq via INTRAVENOUS
  Filled 2018-12-07: qty 100

## 2018-12-07 MED ORDER — CEPHALEXIN 500 MG PO CAPS: 500.0000 mg | ORAL_CAPSULE | Freq: Three times a day (TID) | ORAL | 0 refills | Status: DC

## 2018-12-07 MED ORDER — PREDNISONE 20 MG PO TABS
60.0000 mg | ORAL_TABLET | Freq: Once | ORAL | Status: AC
  Administered 2018-12-07: 60 mg via ORAL
  Filled 2018-12-07: qty 3

## 2018-12-07 NOTE — Discharge Instructions (Signed)
You can call your neurologist on Monday about your headache.  You can also call the ophthalmologist that is listed as you may need to have a biopsy to determine the cause of your headache.  Please take 1 dose of potassium each day for the next 3 days  You are having a headache. No specific cause was found today for your headache. It may have been a migraine or other cause of headache. Stress, anxiety, fatigue, and depression are common triggers for headaches. Your headache today does not appear to be life-threatening or require hospitalization, but often the exact cause of headaches is not determined in the emergency department. Therefore, follow-up with your doctor is very important to find out what may have caused your headache, and whether or not you need any further diagnostic testing or treatment. Sometimes headaches can appear benign (not harmful), but then more serious symptoms can develop which should prompt an immediate re-evaluation by your doctor or the emergency department.  SEEK MEDICAL ATTENTION IF:  You develop possible problems with medications prescribed.  The medications don't resolve your headache, if it recurs , or if you have multiple episodes of vomiting or can't take fluids. You have a change from the usual headache.  RETURN IMMEDIATELY IF you develop a sudden, severe headache or confusion, become poorly responsive or faint, develop a fever above 100.44F or problem breathing, have a change in speech, vision, swallowing, or understanding, or develop new weakness, numbness, tingling, incoordination, or have a seizure.

## 2018-12-07 NOTE — ED Notes (Signed)
Date and time results received: 12/07/18 0034  Test: Potassium  Critical Value: 2.7  Name of Provider Notified: Dr. Christy Gentles  Orders Received? Or Actions Taken?: see orders

## 2018-12-07 NOTE — ED Notes (Signed)
Pt transported to CT ?

## 2018-12-09 DIAGNOSIS — G441 Vascular headache, not elsewhere classified: Secondary | ICD-10-CM | POA: Diagnosis not present

## 2018-12-10 LAB — URINE CULTURE: Culture: >=100000 — AB

## 2018-12-11 ENCOUNTER — Telehealth: Payer: Self-pay

## 2018-12-11 NOTE — Telephone Encounter (Signed)
Post ED Visit - Positive Culture Follow-up: Successful Patient Follow-Up  Culture assessed and recommendations reviewed by:  []  Elenor Quinones, Pharm.D. []  Heide Guile, Pharm.D., BCPS AQ-ID []  Parks Neptune, Pharm.D., BCPS []  Alycia Rossetti, Pharm.D., BCPS []  [place], Pharm.D., BCPS, AAHIVP []  Legrand Como, Pharm.D., BCPS, AAHIVP []  Salome Arnt, PharmD, BCPS []  Johnnette Gourd, PharmD, BCPS []  Hughes Better, PharmD, BCPS []  Leeroy Cha, PharmD Dia Sitter Pharm D Positive urine culture  []  Patient discharged without antimicrobial prescription and treatment is now indicated [x]  Organism is resistant to prescribed ED discharge antimicrobial []  Patient with positive blood cultures  Changes discussed with ED provider: Langston Masker PA New antibiotic prescription Bactrim DS 1 BID x 3 days Called to [facility]  Contacted patient, date 12/11/2018, time 0956   Genia Del 12/11/2018, 9:55 AM

## 2018-12-12 DIAGNOSIS — R700 Elevated erythrocyte sedimentation rate: Secondary | ICD-10-CM | POA: Diagnosis not present

## 2018-12-12 DIAGNOSIS — R51 Headache: Secondary | ICD-10-CM | POA: Diagnosis not present

## 2018-12-12 DIAGNOSIS — M316 Other giant cell arteritis: Secondary | ICD-10-CM | POA: Diagnosis not present

## 2018-12-12 DIAGNOSIS — M1990 Unspecified osteoarthritis, unspecified site: Secondary | ICD-10-CM | POA: Diagnosis not present

## 2018-12-19 DIAGNOSIS — N390 Urinary tract infection, site not specified: Secondary | ICD-10-CM | POA: Diagnosis not present

## 2018-12-26 DIAGNOSIS — M25552 Pain in left hip: Secondary | ICD-10-CM | POA: Diagnosis not present

## 2018-12-31 DIAGNOSIS — M25552 Pain in left hip: Secondary | ICD-10-CM | POA: Diagnosis not present

## 2019-01-03 DIAGNOSIS — M25561 Pain in right knee: Secondary | ICD-10-CM | POA: Diagnosis not present

## 2019-01-03 DIAGNOSIS — M25562 Pain in left knee: Secondary | ICD-10-CM | POA: Diagnosis not present

## 2019-01-03 DIAGNOSIS — Z96642 Presence of left artificial hip joint: Secondary | ICD-10-CM | POA: Diagnosis not present

## 2019-01-06 DIAGNOSIS — M25552 Pain in left hip: Secondary | ICD-10-CM | POA: Diagnosis not present

## 2019-01-07 DIAGNOSIS — R350 Frequency of micturition: Secondary | ICD-10-CM | POA: Diagnosis not present

## 2019-01-07 DIAGNOSIS — L570 Actinic keratosis: Secondary | ICD-10-CM | POA: Diagnosis not present

## 2019-01-07 DIAGNOSIS — R509 Fever, unspecified: Secondary | ICD-10-CM | POA: Diagnosis not present

## 2019-01-13 DIAGNOSIS — R51 Headache: Secondary | ICD-10-CM | POA: Diagnosis not present

## 2019-01-13 DIAGNOSIS — M25552 Pain in left hip: Secondary | ICD-10-CM | POA: Diagnosis not present

## 2019-01-13 DIAGNOSIS — R700 Elevated erythrocyte sedimentation rate: Secondary | ICD-10-CM | POA: Diagnosis not present

## 2019-01-13 DIAGNOSIS — M1990 Unspecified osteoarthritis, unspecified site: Secondary | ICD-10-CM | POA: Diagnosis not present

## 2019-01-13 DIAGNOSIS — M316 Other giant cell arteritis: Secondary | ICD-10-CM | POA: Diagnosis not present

## 2019-01-16 DIAGNOSIS — M25552 Pain in left hip: Secondary | ICD-10-CM | POA: Diagnosis not present

## 2019-01-23 DIAGNOSIS — M25552 Pain in left hip: Secondary | ICD-10-CM | POA: Diagnosis not present

## 2019-01-28 DIAGNOSIS — M25552 Pain in left hip: Secondary | ICD-10-CM | POA: Diagnosis not present

## 2019-01-30 DIAGNOSIS — M25552 Pain in left hip: Secondary | ICD-10-CM | POA: Diagnosis not present

## 2019-02-03 DIAGNOSIS — M25552 Pain in left hip: Secondary | ICD-10-CM | POA: Diagnosis not present

## 2019-02-06 DIAGNOSIS — M25552 Pain in left hip: Secondary | ICD-10-CM | POA: Diagnosis not present

## 2019-02-10 NOTE — Progress Notes (Signed)
Cardiology Office Note   Date:  02/11/2019   ID:  Kerri [name], DOB 1930/04/03, MRN [id number]  PCP:  Lavone Orn, MD  Cardiologist: Dr. Percival Spanish CC: F/U DOE  History of Present Illness: Kerri [name] is a 85 y.o. female who presents for ongoing assessment and management of hypertension, palpitations, with other history to include cluster headaches.  She was last seen in the office on 09/20/2018 by Dr. Percival Spanish and was stable from a cardiac standpoint.  No medication changes or testing was planned.  She was seen in the emergency room on 12/06/2018 for migraine and dizziness.  She was noted to be hypokalemic with a potassium of 2.7, she was also found to be positive for UTI, EKG revealed sinus rhythm with frequent PVCs.  She was given potassium replacement, started on Keflex 500 mg, and also given steroid taper for presumptive temporal arteritis.  She was to follow-up with her neurologist.  She comes today with multiple complaints.  She apparently had left hip surgery completed and has not felt as if she is gotten her stamina back.  Surgery was completed in early July.  She has a daughter who is an Therapist, sports, who has a swimming pool.  She went swimming in the pool the first part of August and was unable to complete a lap due to shortness of breath.  She complains that she cannot be as active as she used to be, stating that her usual routine was playing tennis and golf and swimming.  She has not been able to do so over the last year or so.  She denies chest pain, dizziness, or weakness.  She also has questions concerning her potassium status.  She takes potassium supplement with chlorthalidone for blood pressure control.  However she only takes the chlorthalidone when her blood pressure is elevated.  Most recent labs in July revealed a potassium of 2.7.  I do not see any new labs since that time.   Past Medical History:  Diagnosis Date   Adenomatous colon polyp    Asthma    CAD (coronary artery disease)    Chest CTA 10/07: 40% or less pLAD;  ETT 8/09: negative   Carotid bruit    Chronic back pain    Depression    Diverticulosis    DJD (degenerative joint disease)    Dyslipidemia    Dysrhythmia    pvcs   Fatty liver    GERD (gastroesophageal reflux disease)    HTN (hypertension)    Hypothyroidism    IBS (irritable bowel syndrome)    Internal hemorrhoid    Melanoma ([place])    metastatic; left leg; s/p multiple excisions   Migraine    Osteoporosis    Spinal stenosis     Past Surgical History:  Procedure Laterality Date   BACK SURGERY     BLEPHAROPLASTY     BUNIONECTOMY WITH HAMMERTOE RECONSTRUCTION Right 01/16/2013   Procedure: RIGHT Barbie Banner OSTEOTOMY WITH SIMPLE BUNIONECTOMY AND SECOND HAMMER TOE CORRECTION;  Surgeon: Wylene Simmer, MD;  Location: [place];  Service: Orthopedics;  Laterality: Right;   CARPAL TUNNEL RELEASE     KNEE ARTHROSCOPY Right    LUMBAR LAMINECTOMY/DECOMPRESSION MICRODISCECTOMY N/A 11/13/2013   Procedure: LUMBAR TWO-THREE,LUMBAR THREE-FOUR,LUMBAR FOUR-FIVE LAMINECTOMY AND FORAMINOTOMY;  Surgeon: Ophelia Charter, MD;  Location: [place] [facility];  Service: Neurosurgery;  Laterality: N/A;   MELANOMA EXCISION Left 09/19/2012   Procedure: WIDE EXCISION METASTATIC MELANOMA LEFT MEDIAL THIGH;  Surgeon: Zenovia Jarred, MD;  Location: [facility]  [place];  Service: General;  Laterality: Left;   ROTATOR CUFF REPAIR     SHOULDER SURGERY     TOE SURGERY     TOTAL HIP ARTHROPLASTY  09/04/2011   Procedure: TOTAL HIP ARTHROPLASTY;  Surgeon: Gearlean Alf, MD;  [facility];  Service: Orthopedics;  Laterality: Left;   TOTAL HIP REVISION Left 11/27/2018   Procedure: Left hip femoral revision;  Surgeon: Gaynelle Arabian, MD;  [facility];  Service: Orthopedics;  Laterality: Left;  121min     Current Outpatient Medications  Medication Sig Dispense Refill   acetaminophen (TYLENOL) 500 MG tablet Take 500 mg by mouth daily.      alendronate (FOSAMAX) 70 MG tablet Take 70 mg by mouth every 7 (seven) days.     Biotin 5 MG CAPS Take 5 mg by mouth daily.     calcium carbonate (TUMS - DOSED IN MG ELEMENTAL CALCIUM) 500 MG chewable tablet Chew 2 tablets by mouth daily.     chlorthalidone (HYGROTON) 25 MG tablet Take 1 tablet (25 mg total) by mouth daily. Only takes if bp over [id number] systolic (Patient taking differently: Take 25 mg by mouth daily as needed (only if BP > AB-123456789 systolic). ) 90 tablet 3   Cholecalciferol (VITAMIN D3) 50 MCG (2000 UT) TABS Take 2,000 Units by mouth daily.     esomeprazole (NEXIUM) 20 MG capsule Take 20 mg by mouth daily.      fluticasone (FLONASE) 50 MCG/ACT nasal spray Place 1 spray into both nostrils daily.   5   gabapentin (NEURONTIN) 300 MG capsule Take 300 mg by mouth 2 (two) times daily.      levothyroxine (SYNTHROID, LEVOTHROID) 75 MCG tablet Take 75 mcg by mouth daily with breakfast.     loratadine (CLARITIN) 10 MG tablet Take 10 mg by mouth daily.      Magnesium 250 MG TABS Take 250 mg by mouth daily.      methocarbamol (ROBAXIN) 500 MG tablet Take 1 tablet (500 mg total) by mouth every 6 (six) hours as needed for muscle spasms. 40 tablet 0   polyethylene glycol powder (MIRALAX) 17 GM/SCOOP powder Take 17 g by mouth at bedtime.      potassium chloride (K-DUR,KLOR-CON) 10 MEQ tablet Take 10 mEq by mouth daily as needed (Only takes when taking Chlorthalidone).   12   predniSONE (DELTASONE) 50 MG tablet Take 1 tablet (50 mg total) by mouth daily with breakfast. 6 tablet 0   Probiotic Product (PROBIOTIC PO) Take 1 capsule by mouth daily.     Propylene Glycol (SYSTANE BALANCE) 0.6 % SOLN Place 1 drop into both eyes daily.     rosuvastatin (CRESTOR) 10 MG tablet Take 10 mg by mouth at bedtime.      tretinoin (RETIN-A) 0.05 % cream Apply 1 application topically every other day.      No current facility-administered medications for this visit.     Allergies:   Buprenorphine  hcl, Codeine, Morphine and related, Tramadol, Adhesive [tape], Hydrocodone-acetaminophen, Lactose intolerance (gi), and Oxycodone    Social History:  The patient  reports that she quit smoking about 70 years ago. Her smoking use included cigarettes. She has never used smokeless tobacco. She reports current alcohol use. She reports that she does not use drugs.   Family History:  The patient's family history includes CAD in her father and mother; Kidney disease in her child.    ROS: All other systems are reviewed and negative. Unless otherwise  mentioned in H&P    PHYSICAL EXAM: VS:  BP 127/77    Pulse 83    Ht 5\' 5"  (1.651 m)    Wt 129 lb (58.5 kg)    SpO2 97%    BMI 21.47 kg/m  , BMI Body mass index is 21.47 kg/m. GEN: Well nourished, well developed, in no acute distress HEENT: normal Neck: no JVD, carotid bruits, or masses Cardiac: RRR; 2/6 holosystolic murmur at the right sternal border no, rubs, or gallops,no edema  Respiratory:  Clear to auscultation bilaterally, normal work of breathing GI: soft, nontender, nondistended, + BS MS: no deformity or atrophy Skin: warm and dry, no rash Neuro:  Strength and sensation are intact Psych: euthymic mood, full affect.  Very talkative moving from one subject to another rapidly.   EKG: Not completed this office visit.  Recent Labs: 11/21/2018: ALT 19 12/06/2018: BUN 15; Creatinine, Ser 0.61; Hemoglobin 11.7; Platelets 355; Potassium 2.7; Sodium 140    Lipid Panel No results found for: CHOL, TRIG, HDL, CHOLHDL, VLDL, LDLCALC, LDLDIRECT    Wt Readings from Last 3 Encounters:  02/11/19 129 lb (58.5 kg)  11/27/18 127 lb 13.9 oz (58 kg)  11/21/18 127 lb 12.8 oz (58 kg)      Other studies Reviewed: None recent  ASSESSMENT AND PLAN:  1.  Dyspnea on exertion: The patient is feeling generally deconditioned with dyspnea when she tries to swim in her daughter's pool.  She normally swam very often but has had to stop.  On further pressing,  she has not really swam for about 10 years.  She was try to get back in shape after her left hip surgery.  This may be related simply to deconditioning.   She denies chest pain, dizziness, or palpitations.  I will will repeat her echocardiogram for evaluation of LV function, and for aortic valve murmur which was noted during her assessment today.  This is been explained to her at length with multiple questions answered.  I will also check a CBC with history of anemia.  2.  Hypokalemia: Most recent labs revealed a potassium of 2.7 in July.  She denies any palpitations or pains in her legs with muscle cramps.  She is on potassium supplement which she takes when she takes chlorthalidone for blood pressure control.  I am repeating a BMET to evaluate her status and will also include a magnesium.  3.  Hypertension: Patient's blood pressure is well controlled today.  She states that her blood pressure is sometimes elevated in the 140s to 150s.  She is very anxious about this.  She states that she usually takes her blood pressure standing when she walks in her room.  I explained to her that she needs to be standing for 5 minutes with both feet on the floor before she takes her blood pressure.  Have her blood pressure is elevated she takes the  dose of chlorthalidone along with potassium otherwise she does not take chlorthalidone on a daily basis.  Current medicines are reviewed at length with the patient today.  I have spent over 35 minutes with this patient answering multiple questions.   Labs/ tests ordered today include: BMET, CBC, echocardiogram. Phill Myron. [place] Pugh, ANP, AACC   02/11/2019 2:05 PM    [facility] [address] Office [phone] Fax [phone]

## 2019-02-11 ENCOUNTER — Ambulatory Visit (INDEPENDENT_AMBULATORY_CARE_PROVIDER_SITE_OTHER): Payer: Medicare Other | Admitting: Adult Health

## 2019-02-11 ENCOUNTER — Other Ambulatory Visit: Payer: Self-pay

## 2019-02-11 ENCOUNTER — Encounter: Payer: Self-pay | Admitting: Adult Health

## 2019-02-11 VITALS — BP 127/77 | HR 83 | Ht 65.0 in | Wt 129.0 lb

## 2019-02-11 DIAGNOSIS — R0609 Other forms of dyspnea: Secondary | ICD-10-CM

## 2019-02-11 DIAGNOSIS — D508 Other iron deficiency anemias: Secondary | ICD-10-CM

## 2019-02-11 DIAGNOSIS — I358 Other nonrheumatic aortic valve disorders: Secondary | ICD-10-CM | POA: Diagnosis not present

## 2019-02-11 DIAGNOSIS — Z79899 Other long term (current) drug therapy: Secondary | ICD-10-CM | POA: Diagnosis not present

## 2019-02-11 DIAGNOSIS — R0600 Dyspnea, unspecified: Secondary | ICD-10-CM

## 2019-02-11 DIAGNOSIS — I10 Essential (primary) hypertension: Secondary | ICD-10-CM

## 2019-02-11 NOTE — Patient Instructions (Addendum)
Medication Instructions:  Continue current medications  If you need a refill on your cardiac medications before your next appointment, please call your pharmacy.  Labwork: CBC, BMP and Magnesium HERE IN OUR OFFICE AT [facility]  You will NOT need to fast   Take the provided lab slips with you to the lab for your blood draw.   When you have your labs (blood work) drawn today and your tests are completely normal, you will receive your results only by MyChart Message (if you have MyChart) -OR-  A paper copy in the mail.  If you have any lab test that is abnormal or we need to change your treatment, we will call you to review these results.  Testing/Procedures: Your physician has requested that you have an echocardiogram. Echocardiography is a painless test that uses sound waves to create images of your heart. It provides your doctor with information about the size and shape of your heart and how well your heart's chambers and valves are working. This procedure takes approximately one hour. There are no restrictions for this procedure.  Follow-Up: . Your physician recommends that you schedule a follow-up appointment in: 6 Weeks with Dr Percival Spanish   At [facility], you and your health needs are our priority.  As part of our continuing mission to provide you with exceptional heart care, we have created designated Provider Care Teams.  These Care Teams include your primary Cardiologist (physician) and Advanced Practice Providers (APPs -  Physician Assistants and Nurse Practitioners) who all work together to provide you with the care you need, when you need it.  Thank you for choosing [facility] at [facility]!!

## 2019-02-12 LAB — BASIC METABOLIC PANEL
BUN/Creatinine Ratio: 19 (ref 12–28)
BUN: 14 mg/dL (ref 8–27)
CO2: 26 mmol/L (ref 20–29)
Calcium: 9.7 mg/dL (ref 8.7–10.3)
Chloride: 98 mmol/L (ref 96–106)
Creatinine, Ser: 0.73 mg/dL (ref 0.57–1.00)
GFR calc Af Amer: 86 mL/min/{1.73_m2} (ref >59)
GFR calc non Af Amer: 74 mL/min/{1.73_m2} (ref >59)
Glucose: 107 mg/dL — ABNORMAL HIGH (ref 65–99)
Potassium: 4.2 mmol/L (ref 3.5–5.2)
Sodium: 138 mmol/L (ref 134–144)

## 2019-02-12 LAB — CBC
Hematocrit: 40.4 % (ref 34.0–46.6)
Hemoglobin: 13 g/dL (ref 11.1–15.9)
MCH: 29.7 pg (ref 26.6–33.0)
MCHC: 32.2 g/dL (ref 31.5–35.7)
MCV: 92 fL (ref 79–97)
Platelets: 248 10*3/uL (ref 150–450)
RBC: 4.37 x10E6/uL (ref 3.77–5.28)
RDW: 12.8 % (ref 11.7–15.4)
WBC: 6.3 10*3/uL (ref 3.4–10.8)

## 2019-02-12 LAB — MAGNESIUM: Magnesium: 2 mg/dL (ref 1.6–2.3)

## 2019-02-13 DIAGNOSIS — M25552 Pain in left hip: Secondary | ICD-10-CM | POA: Diagnosis not present

## 2019-02-14 ENCOUNTER — Other Ambulatory Visit: Payer: Self-pay

## 2019-02-14 ENCOUNTER — Ambulatory Visit (HOSPITAL_COMMUNITY): Payer: Medicare Other | Attending: Cardiology

## 2019-02-14 DIAGNOSIS — I358 Other nonrheumatic aortic valve disorders: Secondary | ICD-10-CM | POA: Insufficient documentation

## 2019-02-20 ENCOUNTER — Telehealth: Payer: Self-pay | Admitting: Cardiology

## 2019-02-20 DIAGNOSIS — M25552 Pain in left hip: Secondary | ICD-10-CM | POA: Diagnosis not present

## 2019-02-20 NOTE — Telephone Encounter (Signed)
New Message    Patient calling in for echo results.

## 2019-02-20 NOTE — Telephone Encounter (Signed)
Advised pt of Echo Results. Verbalized understanding.   Notes recorded by Lendon Colonel, NP on 02/16/2019 at 10:25 AM EDT  Reviewed echo results. Essentially normal. No changes in her regimen at this time.

## 2019-03-04 DIAGNOSIS — M25561 Pain in right knee: Secondary | ICD-10-CM | POA: Diagnosis not present

## 2019-03-04 DIAGNOSIS — M25562 Pain in left knee: Secondary | ICD-10-CM | POA: Diagnosis not present

## 2019-03-04 DIAGNOSIS — Z23 Encounter for immunization: Secondary | ICD-10-CM | POA: Diagnosis not present

## 2019-03-04 DIAGNOSIS — R413 Other amnesia: Secondary | ICD-10-CM | POA: Diagnosis not present

## 2019-03-06 DIAGNOSIS — M170 Bilateral primary osteoarthritis of knee: Secondary | ICD-10-CM | POA: Diagnosis not present

## 2019-03-06 DIAGNOSIS — Z96642 Presence of left artificial hip joint: Secondary | ICD-10-CM | POA: Diagnosis not present

## 2019-03-27 ENCOUNTER — Ambulatory Visit: Payer: Medicare Other | Admitting: Cardiology

## 2019-03-29 DIAGNOSIS — R002 Palpitations: Secondary | ICD-10-CM | POA: Insufficient documentation

## 2019-03-29 NOTE — Progress Notes (Signed)
Cardiology Office Note   Date:  03/31/2019   ID:  Kerri [name], DOB 26-Dec-1929, MRN [id number]  PCP:  Kerri Orn, MD  Cardiologist:   Kerri Breeding, MD   Chief Complaint  Patient presents with  . Palpitations      History of Present Illness: Kerri [name] is a 85 y.o. female who presents for ongoing assessment and management of hypertension, palpitations, with other history to include cluster headaches.  She was seen in the emergency room on 12/06/2018 for migraine and dizziness.  She was noted to be hypokalemic with a potassium of 2.7, she was also found to be positive for UTI, EKG revealed sinus rhythm with frequent PVCs.  She was given potassium replacement, started on Keflex 500 mg, and also given steroid taper for presumptive temporal arteritis.    Since I last saw her she complains of heartburn.  This happens when she lies down at night.  Does not happen when she is physically active like walking [phone] steps in the store.  She complains about multiple aches and pains.  She is having recurrent urinary tract infections.  Her blood pressure seems to be reasonably controlled.  She is not noticing any palpitations however.  She has no acute cardiac complaints.    Past Medical History:  Diagnosis Date  . Adenomatous colon polyp   . Asthma   . CAD (coronary artery disease)    Chest CTA 10/07: 40% or less pLAD;  ETT 8/09: negative  . Carotid bruit   . Chronic back pain   . Depression   . Diverticulosis   . DJD (degenerative joint disease)   . Dyslipidemia   . Dysrhythmia    pvcs  . Fatty liver   . GERD (gastroesophageal reflux disease)   . HTN (hypertension)   . Hypothyroidism   . IBS (irritable bowel syndrome)   . Internal hemorrhoid   . Melanoma ([place])    metastatic; left leg; s/p multiple excisions  . Migraine   . Osteoporosis   . Spinal stenosis     Past Surgical History:  Procedure Laterality Date  . BACK SURGERY    . BLEPHAROPLASTY    . BUNIONECTOMY WITH  HAMMERTOE RECONSTRUCTION Right 01/16/2013   Procedure: RIGHT Barbie Banner OSTEOTOMY WITH SIMPLE BUNIONECTOMY AND SECOND HAMMER TOE CORRECTION;  Surgeon: Wylene Simmer, MD;  Location: [place];  Service: Orthopedics;  Laterality: Right;  . CARPAL TUNNEL RELEASE    . KNEE ARTHROSCOPY Right   . LUMBAR LAMINECTOMY/DECOMPRESSION MICRODISCECTOMY N/A 11/13/2013   Procedure: LUMBAR TWO-THREE,LUMBAR THREE-FOUR,LUMBAR FOUR-FIVE LAMINECTOMY AND FORAMINOTOMY;  Surgeon: Ophelia Charter, MD;  Location: [place] [facility];  Service: Neurosurgery;  Laterality: N/A;  . MELANOMA EXCISION Left 09/19/2012   Procedure: WIDE EXCISION METASTATIC MELANOMA LEFT MEDIAL THIGH;  Surgeon: Zenovia Jarred, MD;  Location: [place];  Service: General;  Laterality: Left;  . ROTATOR CUFF REPAIR    . SHOULDER SURGERY    . TOE SURGERY    . TOTAL HIP ARTHROPLASTY  09/04/2011   Procedure: TOTAL HIP ARTHROPLASTY;  Surgeon: Gearlean Alf, MD;  [facility];  Service: Orthopedics;  Laterality: Left;  . TOTAL HIP REVISION Left 11/27/2018   Procedure: Left hip femoral revision;  Surgeon: Gaynelle Arabian, MD;  [facility];  Service: Orthopedics;  Laterality: Left;  142min     Current Outpatient Medications  Medication Sig Dispense Refill  . acetaminophen (TYLENOL) 500 MG tablet Take 500 mg by mouth daily.    Marland Kitchen  alendronate (FOSAMAX) 70 MG tablet Take 70 mg by mouth every 7 (seven) days.    . Biotin 5 MG CAPS Take 5 mg by mouth daily.    . calcium carbonate (TUMS - DOSED IN MG ELEMENTAL CALCIUM) 500 MG chewable tablet Chew 2 tablets by mouth daily.    . chlorthalidone (HYGROTON) 25 MG tablet Take 1 tablet (25 mg total) by mouth daily. Only takes if bp over AB-123456789 systolic (Patient taking differently: Take 25 mg by mouth daily as needed (only if BP > AB-123456789 systolic). ) 90 tablet 3  . Cholecalciferol (VITAMIN D3) 50 MCG (2000 UT) TABS Take 2,000 Units by mouth daily.    Marland Kitchen esomeprazole (NEXIUM) 20 MG capsule Take 20 mg  by mouth daily.     . fluticasone (FLONASE) 50 MCG/ACT nasal spray Place 1 spray into both nostrils daily.   5  . gabapentin (NEURONTIN) 300 MG capsule Take 300 mg by mouth 2 (two) times daily.     Marland Kitchen levothyroxine (SYNTHROID, LEVOTHROID) 75 MCG tablet Take 75 mcg by mouth daily with breakfast.    . loratadine (CLARITIN) 10 MG tablet Take 10 mg by mouth daily.     . Magnesium 250 MG TABS Take 250 mg by mouth daily.     . methocarbamol (ROBAXIN) 500 MG tablet Take 1 tablet (500 mg total) by mouth every 6 (six) hours as needed for muscle spasms. 40 tablet 0  . polyethylene glycol powder (MIRALAX) 17 GM/SCOOP powder Take 17 g by mouth at bedtime.     . potassium chloride (K-DUR,KLOR-CON) 10 MEQ tablet Take 10 mEq by mouth daily as needed (Only takes when taking Chlorthalidone).   12  . Probiotic Product (PROBIOTIC PO) Take 1 capsule by mouth daily.    Marland Kitchen Propylene Glycol (SYSTANE BALANCE) 0.6 % SOLN Place 1 drop into both eyes daily.    . rosuvastatin (CRESTOR) 10 MG tablet Take 10 mg by mouth at bedtime.     . tretinoin (RETIN-A) 0.05 % cream Apply 1 application topically every other day.      No current facility-administered medications for this visit.     Allergies:   Buprenorphine hcl, Codeine, Morphine and related, Tramadol, Adhesive [tape], Hydrocodone-acetaminophen, Lactose intolerance (gi), and Oxycodone    ROS:  Please see the history of present illness.   Otherwise, review of systems are positive for none.   All other systems are reviewed and negative.    PHYSICAL EXAM: VS:  BP 123/77   Pulse 73   Temp (!) 97.3 F (36.3 C)   Ht 5\' 5"  (1.651 m)   Wt 125 lb (56.7 kg)   SpO2 98%   BMI 20.80 kg/m  , BMI Body mass index is 20.8 kg/m. GENERAL:  Well appearing NECK:  No jugular venous distention, waveform within normal limits, carotid upstroke brisk and symmetric, no bruits, no thyromegaly LUNGS:  Clear to auscultation bilaterally CHEST:  Unremarkable HEART:  PMI not displaced or  sustained,S1 and S2 within normal limits, no S3, no S4, no clicks, no rubs, no murmurs ABD:  Flat, positive bowel sounds normal in frequency in pitch, no bruits, no rebound, no guarding, no midline pulsatile mass, no hepatomegaly, no splenomegaly EXT:  2 plus pulses throughout, no edema, no cyanosis no clubbing   EKG:  EKG is not ordered today.   Recent Labs: 11/21/2018: ALT 19 02/11/2019: BUN 14; Creatinine, Ser 0.73; Hemoglobin 13.0; Magnesium 2.0; Platelets 248; Potassium 4.2; Sodium 138    Lipid Panel No results  found for: CHOL, TRIG, HDL, CHOLHDL, VLDL, LDLCALC, LDLDIRECT    Wt Readings from Last 3 Encounters:  03/31/19 125 lb (56.7 kg)  02/11/19 129 lb (58.5 kg)  11/27/18 127 lb 13.9 oz (58 kg)      Other studies Reviewed: Additional studies/ records that were reviewed today include: None. Review of the above records demonstrates:  Please see elsewhere in the note.     ASSESSMENT AND PLAN:  Dyspnea on exertion:     She is not reporting this currently.  No change in therapy.  No further work-up.  I have encouraged physical exercise we talked about this specifically.   Hypertension:    Her blood pressure is currently well controlled.  No change in therapy.    Current medicines are reviewed at length with the patient today.  The patient does not have concerns regarding medicines.  The following changes have been made:  no change  Labs/ tests ordered today include: None No orders of the defined types were placed in this encounter.    Disposition:   FU with me in one year.     Signed, Kerri Breeding, MD  03/31/2019 1:24 PM    [facility]

## 2019-03-31 ENCOUNTER — Ambulatory Visit (INDEPENDENT_AMBULATORY_CARE_PROVIDER_SITE_OTHER): Payer: Medicare Other | Admitting: Cardiology

## 2019-03-31 ENCOUNTER — Encounter: Payer: Self-pay | Admitting: Cardiology

## 2019-03-31 ENCOUNTER — Other Ambulatory Visit: Payer: Self-pay

## 2019-03-31 VITALS — BP 123/77 | HR 73 | Temp 97.3°F | Ht 65.0 in | Wt 125.0 lb

## 2019-03-31 DIAGNOSIS — R002 Palpitations: Secondary | ICD-10-CM

## 2019-03-31 DIAGNOSIS — E876 Hypokalemia: Secondary | ICD-10-CM

## 2019-03-31 DIAGNOSIS — I10 Essential (primary) hypertension: Secondary | ICD-10-CM

## 2019-03-31 NOTE — Patient Instructions (Signed)
Medication Instructions:  NO CHANGE *If you need a refill on your cardiac medications before your next appointment, please call your pharmacy*  Lab Work: If you have labs (blood work) drawn today and your tests are completely normal, you will receive your results only by: . MyChart Message (if you have MyChart) OR . A paper copy in the mail If you have any lab test that is abnormal or we need to change your treatment, we will call you to review the results.  Follow-Up: At [facility], you and your health needs are our priority.  As part of our continuing mission to provide you with exceptional heart care, we have created designated Provider Care Teams.  These Care Teams include your primary Cardiologist (physician) and Advanced Practice Providers (APPs -  Physician Assistants and Nurse Practitioners) who all work together to provide you with the care you need, when you need it.  Your next appointment:   12 months  The format for your next appointment:   In Person  Provider:   James Hochrein, MD   

## 2019-04-02 DIAGNOSIS — N390 Urinary tract infection, site not specified: Secondary | ICD-10-CM | POA: Diagnosis not present

## 2019-04-08 ENCOUNTER — Other Ambulatory Visit: Payer: Self-pay | Admitting: Physician Assistant

## 2019-04-08 DIAGNOSIS — L720 Epidermal cyst: Secondary | ICD-10-CM | POA: Diagnosis not present

## 2019-04-08 DIAGNOSIS — L570 Actinic keratosis: Secondary | ICD-10-CM | POA: Diagnosis not present

## 2019-04-08 DIAGNOSIS — D485 Neoplasm of uncertain behavior of skin: Secondary | ICD-10-CM | POA: Diagnosis not present

## 2019-04-10 DIAGNOSIS — E782 Mixed hyperlipidemia: Secondary | ICD-10-CM | POA: Diagnosis not present

## 2019-04-10 DIAGNOSIS — M48061 Spinal stenosis, lumbar region without neurogenic claudication: Secondary | ICD-10-CM | POA: Diagnosis not present

## 2019-04-10 DIAGNOSIS — I10 Essential (primary) hypertension: Secondary | ICD-10-CM | POA: Diagnosis not present

## 2019-04-10 DIAGNOSIS — E039 Hypothyroidism, unspecified: Secondary | ICD-10-CM | POA: Diagnosis not present

## 2019-04-25 DIAGNOSIS — Z96642 Presence of left artificial hip joint: Secondary | ICD-10-CM | POA: Diagnosis not present

## 2019-04-25 DIAGNOSIS — Z471 Aftercare following joint replacement surgery: Secondary | ICD-10-CM | POA: Diagnosis not present

## 2019-05-02 DIAGNOSIS — M545 Low back pain: Secondary | ICD-10-CM | POA: Diagnosis not present

## 2019-05-02 DIAGNOSIS — S80211A Abrasion, right knee, initial encounter: Secondary | ICD-10-CM | POA: Diagnosis not present

## 2019-05-26 ENCOUNTER — Other Ambulatory Visit: Payer: Self-pay | Admitting: Internal Medicine

## 2019-05-26 DIAGNOSIS — Z1231 Encounter for screening mammogram for malignant neoplasm of breast: Secondary | ICD-10-CM

## 2019-05-29 DIAGNOSIS — L570 Actinic keratosis: Secondary | ICD-10-CM | POA: Diagnosis not present

## 2019-06-05 ENCOUNTER — Other Ambulatory Visit (HOSPITAL_COMMUNITY): Payer: Self-pay | Admitting: Orthopedic Surgery

## 2019-06-05 ENCOUNTER — Other Ambulatory Visit: Payer: Self-pay | Admitting: Orthopedic Surgery

## 2019-06-05 DIAGNOSIS — Z96642 Presence of left artificial hip joint: Secondary | ICD-10-CM

## 2019-06-05 DIAGNOSIS — M1711 Unilateral primary osteoarthritis, right knee: Secondary | ICD-10-CM | POA: Diagnosis not present

## 2019-06-05 DIAGNOSIS — M1712 Unilateral primary osteoarthritis, left knee: Secondary | ICD-10-CM | POA: Diagnosis not present

## 2019-06-05 DIAGNOSIS — M170 Bilateral primary osteoarthritis of knee: Secondary | ICD-10-CM | POA: Diagnosis not present

## 2019-06-06 DIAGNOSIS — Z87440 Personal history of urinary (tract) infections: Secondary | ICD-10-CM | POA: Diagnosis not present

## 2019-06-06 DIAGNOSIS — N952 Postmenopausal atrophic vaginitis: Secondary | ICD-10-CM | POA: Diagnosis not present

## 2019-06-06 DIAGNOSIS — K219 Gastro-esophageal reflux disease without esophagitis: Secondary | ICD-10-CM | POA: Insufficient documentation

## 2019-06-18 ENCOUNTER — Encounter (HOSPITAL_COMMUNITY)
Admission: RE | Admit: 2019-06-18 | Discharge: 2019-06-18 | Disposition: A | Discharge status: Home / Self Care | Payer: Medicare Other | Source: Ambulatory Visit | Attending: Orthopedic Surgery | Admitting: Orthopedic Surgery

## 2019-06-18 ENCOUNTER — Other Ambulatory Visit: Payer: Self-pay

## 2019-06-18 DIAGNOSIS — Z96642 Presence of left artificial hip joint: Secondary | ICD-10-CM | POA: Insufficient documentation

## 2019-06-18 DIAGNOSIS — M25552 Pain in left hip: Secondary | ICD-10-CM | POA: Diagnosis not present

## 2019-06-18 MED ORDER — TECHNETIUM TC 99M MEDRONATE IV KIT
20.0000 | PACK | Freq: Once | INTRAVENOUS | Status: AC | PRN
  Administered 2019-06-18: 20 via INTRAVENOUS

## 2019-07-03 ENCOUNTER — Other Ambulatory Visit: Payer: Self-pay

## 2019-07-03 ENCOUNTER — Ambulatory Visit
Admission: RE | Admit: 2019-07-03 | Discharge: 2019-07-03 | Disposition: A | Discharge status: Home / Self Care | Payer: Medicare Other | Source: Ambulatory Visit | Attending: Internal Medicine | Admitting: Internal Medicine

## 2019-07-03 DIAGNOSIS — Z1231 Encounter for screening mammogram for malignant neoplasm of breast: Secondary | ICD-10-CM | POA: Diagnosis not present

## 2019-07-07 DIAGNOSIS — H00012 Hordeolum externum right lower eyelid: Secondary | ICD-10-CM | POA: Diagnosis not present

## 2019-07-15 DIAGNOSIS — H0012 Chalazion right lower eyelid: Secondary | ICD-10-CM | POA: Diagnosis not present

## 2019-07-15 DIAGNOSIS — H00014 Hordeolum externum left upper eyelid: Secondary | ICD-10-CM | POA: Diagnosis not present

## 2019-07-16 ENCOUNTER — Ambulatory Visit: Payer: Medicare Other

## 2019-07-16 DIAGNOSIS — R399 Unspecified symptoms and signs involving the genitourinary system: Secondary | ICD-10-CM | POA: Diagnosis not present

## 2019-07-24 DIAGNOSIS — M170 Bilateral primary osteoarthritis of knee: Secondary | ICD-10-CM | POA: Diagnosis not present

## 2019-07-31 DIAGNOSIS — R399 Unspecified symptoms and signs involving the genitourinary system: Secondary | ICD-10-CM | POA: Diagnosis not present

## 2019-08-14 DIAGNOSIS — N390 Urinary tract infection, site not specified: Secondary | ICD-10-CM | POA: Diagnosis not present

## 2019-08-15 DIAGNOSIS — M1711 Unilateral primary osteoarthritis, right knee: Secondary | ICD-10-CM | POA: Diagnosis not present

## 2019-08-15 DIAGNOSIS — M25562 Pain in left knee: Secondary | ICD-10-CM | POA: Diagnosis not present

## 2019-08-15 DIAGNOSIS — M25561 Pain in right knee: Secondary | ICD-10-CM | POA: Diagnosis not present

## 2019-08-18 ENCOUNTER — Other Ambulatory Visit: Payer: Self-pay

## 2019-08-18 DIAGNOSIS — Z87440 Personal history of urinary (tract) infections: Secondary | ICD-10-CM | POA: Diagnosis not present

## 2019-08-18 MED ORDER — CHLORTHALIDONE 25 MG PO TABS: 25.0000 mg | ORAL_TABLET | Freq: Every day | ORAL | 2 refills | Status: DC

## 2019-08-25 ENCOUNTER — Telehealth: Payer: Self-pay

## 2019-08-25 NOTE — Telephone Encounter (Signed)
   Primary Cardiologist: Minus Breeding, MD  Chart reviewed as part of pre-operative protocol coverage. Patient was contacted 08/25/2019 in reference to pre-operative risk assessment for pending surgery as outlined below.  Wylie Griscom was last seen on 03/31/2019 by Dr. Percival Spanish.  Since that day, Annmarie Cherek has done fine from a cardiac standpoint. She has been quite limited in mobility by knee pain, though can still complete 4 METs without anginal complaints.   Therefore, based on ACC/AHA guidelines, the patient would be at acceptable risk for the planned procedure without further cardiovascular testing.   I will route this recommendation to the requesting party via Epic fax function and remove from pre-op pool.  Please call with questions.  Abigail Butts, PA-C 08/25/2019, 10:46 AM

## 2019-08-25 NOTE — Telephone Encounter (Signed)
   [facility] Pre-operative Risk Assessment    Request for surgical clearance:  1. What type of surgery is being performed? Right Total Knee arthroplasty   2. When is this surgery scheduled? 10/06/2019   3. What type of clearance is required (medical clearance vs. Pharmacy clearance to hold med vs. Both)? Medical clearance   4. Are there any medications that need to be held prior to surgery and how long?   5. Practice name and name of physician performing surgery? [facility], Dr. Pilar Plate Aluisio  6. What is your office phone number?[phone]   7.   What is your office fax number? [phone]  8.   Anesthesia type (None, local, MAC, general) ? Choice    Jacqulynn Cadet 08/25/2019, 10:20 AM  _________________________________________________________________   (provider comments below)

## 2019-08-26 ENCOUNTER — Ambulatory Visit: Payer: Medicare Other | Admitting: Physician Assistant

## 2019-08-26 ENCOUNTER — Other Ambulatory Visit: Payer: Self-pay

## 2019-08-26 ENCOUNTER — Encounter: Payer: Self-pay | Admitting: Physician Assistant

## 2019-08-26 DIAGNOSIS — Z1283 Encounter for screening for malignant neoplasm of skin: Secondary | ICD-10-CM

## 2019-08-26 DIAGNOSIS — L570 Actinic keratosis: Secondary | ICD-10-CM | POA: Diagnosis not present

## 2019-08-26 NOTE — Progress Notes (Signed)
   Follow-Up Visit   Subjective  Kerri [name] is a 86 y.o. female who presents for the following: Follow-up (Patient here today for follow up).   Location: trunk and arms Duration: months Quality: crusty growths Associated Signs/Symptoms: itch Modifying Factors: persistent   The following portions of the chart were reviewed this encounter and updated as appropriate: Tobacco  Allergies  Meds  Problems  Med Hx  Surg Hx  Fam Hx      Objective  Well appearing patient in no apparent distress; mood and affect are within normal limits.  All skin waist up examined.  Objective  Chest - Medial [facility]), Left Breast, Left Elbow - Posterior, Left Flank, Left Forearm - Posterior (2), Left Hand - Posterior (3), Left Lower Back (3), Mid Back, Right Forearm - Posterior (2), Right Hand - Posterior, Right Upper Arm - Posterior: Erythematous patches with gritty scale.  Objective  head to toe: No Dn   Assessment & Plan  AK (actinic keratosis) (17) Left Elbow - Posterior; Left Hand - Posterior (3); Right Hand - Posterior; Right Upper Arm - Posterior; Left Forearm - Posterior (2); Right Forearm - Posterior (2); Chest - Medial [facility]); Left Breast; Left Flank; Left Lower Back (3); Mid Back  Destruction of lesion - Chest - Medial (Center), Left Breast, Left Elbow - Posterior, Left Flank, Left Forearm - Posterior (2), Left Hand - Posterior (3), Left Lower Back (3), Mid Back, Right Forearm - Posterior (2), Right Hand - Posterior, Right Upper Arm - Posterior Complexity: simple   Destruction method: cryotherapy   Informed consent: discussed and consent obtained   Timeout:  patient name, date of birth, surgical site, and procedure verified Lesion destroyed using liquid nitrogen: Yes   Cryotherapy cycles:  1 Outcome: patient tolerated procedure well with no complications   Post-procedure details: wound care instructions given    Screening exam for skin cancer head to toe  6 mo screening

## 2019-09-09 DIAGNOSIS — N390 Urinary tract infection, site not specified: Secondary | ICD-10-CM | POA: Diagnosis not present

## 2019-09-09 DIAGNOSIS — N952 Postmenopausal atrophic vaginitis: Secondary | ICD-10-CM | POA: Diagnosis not present

## 2019-09-16 DIAGNOSIS — H00012 Hordeolum externum right lower eyelid: Secondary | ICD-10-CM | POA: Diagnosis not present

## 2019-09-23 NOTE — Patient Instructions (Addendum)
DUE TO COVID-19 ONLY ONE VISITOR IS ALLOWED TO COME WITH YOU AND STAY IN THE WAITING ROOM ONLY DURING PRE OP AND PROCEDURE DAY OF SURGERY. THE 1 VISITOR MAY VISIT WITH YOU AFTER SURGERY IN YOUR PRIVATE ROOM DURING VISITING HOURS ONLY!  YOU NEED TO HAVE A COVID 19 TEST ON: 10/02/19 @  2:40 pm , THIS TEST MUST BE DONE BEFORE SURGERY, COME  [place], [address] [place] , [id number].  ([place]) ONCE YOUR COVID TEST IS COMPLETED, PLEASE BEGIN THE QUARANTINE INSTRUCTIONS AS OUTLINED IN YOUR HANDOUT.                Kerri [name]    Your procedure is scheduled on: 10/06/19   Report to [facility] Main  Entrance   Report to admitting at: 8:00 AM     Call this number if you have problems the morning of surgery [phone]    Remember:   NO SOLID FOOD AFTER MIDNIGHT THE NIGHT PRIOR TO SURGERY. NOTHING BY MOUTH EXCEPT CLEAR LIQUIDS UNTIL: 7:30 am . PLEASE FINISH ENSURE DRINK PER SURGEON ORDER  WHICH NEEDS TO BE COMPLETED AT: 7:30 am .   CLEAR LIQUID DIET   Foods Allowed                                                                     Foods Excluded  Coffee and tea, regular and decaf                             liquids that you cannot  Plain Jell-O any favor except red or purple                                           see through such as: Fruit ices (not with fruit pulp)                                     milk, soups, orange juice  Iced Popsicles                                    All solid food Carbonated beverages, regular and diet                                    Cranberry, grape and apple juices Sports drinks like Gatorade Lightly seasoned clear broth or consume(fat free) Sugar, honey syrup  Sample Menu Breakfast                                Lunch                                     Supper Cranberry juice  Beef broth                            Chicken broth Jell-O                                     Grape juice                            Apple juice Coffee or tea                        Jell-O                                      Popsicle                                                Coffee or tea                        Coffee or tea  _____________________________________________________________________  BRUSH YOUR TEETH MORNING OF SURGERY AND RINSE YOUR MOUTH OUT, NO CHEWING GUM CANDY OR MINTS.     Take these medicines the morning of surgery with A SIP OF WATER: esomeprazole,gabapentin,levothyroxine,loratadine.                                 You may not have any metal on your body including hair pins and              piercings  Do not wear jewelry, make-up, lotions, powders or perfumes, deodorant             Do not wear nail polish on your fingernails.  Do not shave  48 hours prior to surgery.               Do not bring valuables to the hospital. [place].  Contacts, dentures or bridgework may not be worn into surgery.  Leave suitcase in the car. After surgery it may be brought to your room.     Patients discharged the day of surgery will not be allowed to drive home. IF YOU ARE HAVING SURGERY AND GOING HOME THE SAME DAY, YOU MUST HAVE AN ADULT TO DRIVE YOU HOME AND BE WITH YOU FOR 24 HOURS. YOU MAY GO HOME BY TAXI OR UBER OR ORTHERWISE, BUT AN ADULT MUST ACCOMPANY YOU HOME AND STAY WITH YOU FOR 24 HOURS.  Name and phone number of your driver:  Special Instructions: N/A              Please read over the following fact sheets you were given: _____________________________________________________________________             [facility] - Preparing for Surgery Before surgery, you can play an important role.  Because skin is not sterile, your skin needs to be as free of germs as possible.  You can reduce the number of germs  on your skin by washing with CHG (chlorahexidine gluconate) soap before surgery.  CHG is an antiseptic cleaner which kills germs and bonds with  the skin to continue killing germs even after washing. Please DO NOT use if you have an allergy to CHG or antibacterial soaps.  If your skin becomes reddened/irritated stop using the CHG and inform your nurse when you arrive at Short Stay. Do not shave (including legs and underarms) for at least 48 hours prior to the first CHG shower.  You may shave your face/neck. Please follow these instructions carefully:  1.  Shower with CHG Soap the night before surgery and the  morning of Surgery.  2.  If you choose to wash your hair, wash your hair first as usual with your  normal  shampoo.  3.  After you shampoo, rinse your hair and body thoroughly to remove the  shampoo.                           4.  Use CHG as you would any other liquid soap.  You can apply chg directly  to the skin and wash                       Gently with a scrungie or clean washcloth.  5.  Apply the CHG Soap to your body ONLY FROM THE NECK DOWN.   Do not use on face/ open                           Wound or open sores. Avoid contact with eyes, ears mouth and genitals (private parts).                       Wash face,  Genitals (private parts) with your normal soap.             6.  Wash thoroughly, paying special attention to the area where your surgery  will be performed.  7.  Thoroughly rinse your body with warm water from the neck down.  8.  DO NOT shower/wash with your normal soap after using and rinsing off  the CHG Soap.                9.  Pat yourself dry with a clean towel.            10.  Wear clean pajamas.            11.  Place clean sheets on your bed the night of your first shower and do not  sleep with pets. Day of Surgery : Do not apply any lotions/deodorants the morning of surgery.  Please wear clean clothes to the hospital/surgery center.  FAILURE TO FOLLOW THESE INSTRUCTIONS MAY RESULT IN THE CANCELLATION OF YOUR SURGERY PATIENT SIGNATURE_________________________________  NURSE  SIGNATURE__________________________________  ________________________________________________________________________   Kerri [name]  An incentive spirometer is a tool that can help keep your lungs clear and active. This tool measures how well you are filling your lungs with each breath. Taking long deep breaths may help reverse or decrease the chance of developing breathing (pulmonary) problems (especially infection) following:  A long period of time when you are unable to move or be active. BEFORE THE PROCEDURE   If the spirometer includes an indicator to show your best effort, your nurse or respiratory therapist will set it to a desired goal.  If possible, sit up straight or lean slightly forward. Try not to slouch.  Hold the incentive spirometer in an upright position. INSTRUCTIONS FOR USE  1. Sit on the edge of your bed if possible, or sit up as far as you can in bed or on a chair. 2. Hold the incentive spirometer in an upright position. 3. Breathe out normally. 4. Place the mouthpiece in your mouth and seal your lips tightly around it. 5. Breathe in slowly and as deeply as possible, raising the piston or the ball toward the top of the column. 6. Hold your breath for 3-5 seconds or for as long as possible. Allow the piston or ball to fall to the bottom of the column. 7. Remove the mouthpiece from your mouth and breathe out normally. 8. Rest for a few seconds and repeat Steps 1 through 7 at least 10 times every 1-2 hours when you are awake. Take your time and take a few normal breaths between deep breaths. 9. The spirometer may include an indicator to show your best effort. Use the indicator as a goal to work toward during each repetition. 10. After each set of 10 deep breaths, practice coughing to be sure your lungs are clear. If you have an incision (the cut made at the time of surgery), support your incision when coughing by placing a pillow or rolled up towels firmly  against it. Once you are able to get out of bed, walk around indoors and cough well. You may stop using the incentive spirometer when instructed by your caregiver.  RISKS AND COMPLICATIONS  Take your time so you do not get dizzy or light-headed.  If you are in pain, you may need to take or ask for pain medication before doing incentive spirometry. It is harder to take a deep breath if you are having pain. AFTER USE  Rest and breathe slowly and easily.  It can be helpful to keep track of a log of your progress. Your caregiver can provide you with a simple table to help with this. If you are using the spirometer at home, follow these instructions: [place] IF:   You are having difficultly using the spirometer.  You have trouble using the spirometer as often as instructed.  Your pain medication is not giving enough relief while using the spirometer.  You develop fever of 100.5 F (38.1 C) or higher. SEEK IMMEDIATE MEDICAL CARE IF:   You cough up bloody sputum that had not been present before.  You develop fever of 102 F (38.9 C) or greater.  You develop worsening pain at or near the incision site. MAKE SURE YOU:   Understand these instructions.  Will watch your condition.  Will get help right away if you are not doing well or get worse. Document Released: 09/18/2006 Document Revised: 07/31/2011 Document Reviewed: 11/19/2006 [facility] Patient Information 2014 [place], [place].   ________________________________________________________________________

## 2019-09-24 ENCOUNTER — Other Ambulatory Visit: Payer: Self-pay

## 2019-09-24 ENCOUNTER — Encounter (HOSPITAL_COMMUNITY)
Admission: RE | Admit: 2019-09-24 | Discharge: 2019-09-24 | Disposition: A | Discharge status: Home / Self Care | Payer: Medicare Other | Source: Ambulatory Visit | Attending: Orthopedic Surgery | Admitting: Orthopedic Surgery

## 2019-09-24 ENCOUNTER — Encounter (HOSPITAL_COMMUNITY): Payer: Self-pay

## 2019-09-24 DIAGNOSIS — Z01812 Encounter for preprocedural laboratory examination: Secondary | ICD-10-CM | POA: Insufficient documentation

## 2019-09-24 LAB — COMPREHENSIVE METABOLIC PANEL
ALT: 21 U/L (ref 0–44)
AST: 24 U/L (ref 15–41)
Albumin: 4.2 g/dL (ref 3.5–5.0)
Alkaline Phosphatase: 80 U/L (ref 38–126)
Anion gap: 10 (ref 5–15)
BUN: 18 mg/dL (ref 8–23)
CO2: 28 mmol/L (ref 22–32)
Calcium: 9.3 mg/dL (ref 8.9–10.3)
Chloride: 102 mmol/L (ref 98–111)
Creatinine, Ser: 0.87 mg/dL (ref 0.44–1.00)
GFR calc Af Amer: >60 mL/min (ref >60)
GFR calc non Af Amer: 60 mL/min — ABNORMAL LOW (ref >60)
Glucose, Bld: 107 mg/dL — ABNORMAL HIGH (ref 70–99)
Potassium: 3.8 mmol/L (ref 3.5–5.1)
Sodium: 140 mmol/L (ref 135–145)
Total Bilirubin: 0.5 mg/dL (ref 0.3–1.2)
Total Protein: 7.2 g/dL (ref 6.5–8.1)

## 2019-09-24 LAB — SURGICAL PCR SCREEN
MRSA, PCR: NEGATIVE
Staphylococcus aureus: NEGATIVE

## 2019-09-24 LAB — CBC
HCT: 37.8 % (ref 36.0–46.0)
Hemoglobin: 12.6 g/dL (ref 12.0–15.0)
MCH: 33.1 pg (ref 26.0–34.0)
MCHC: 33.3 g/dL (ref 30.0–36.0)
MCV: 99.2 fL (ref 80.0–100.0)
Platelets: 206 10*3/uL (ref 150–400)
RBC: 3.81 MIL/uL — ABNORMAL LOW (ref 3.87–5.11)
RDW: 15.7 % — ABNORMAL HIGH (ref 11.5–15.5)
WBC: 6.3 10*3/uL (ref 4.0–10.5)
nRBC: 0 % (ref 0.0–0.2)

## 2019-09-24 LAB — APTT: aPTT: 32 seconds (ref 24–36)

## 2019-09-24 LAB — PROTIME-INR
INR: 1 (ref 0.8–1.2)
Prothrombin Time: 12.9 seconds (ref 11.4–15.2)

## 2019-09-24 NOTE — Progress Notes (Signed)
PCP - Dr. Electa Sniff. LOV: 08/26/19. Cardiologist - Dr. Vita Barley. LOV: 03/31/19.  Chest x-ray - 12/06/18. EPIC EKG -  12/09/19. EPIC Stress Test -  ECHO - 02/14/19 EPIC Cardiac Cath -   Sleep Study -  CPAP -   Fasting Blood Sugar -  Checks Blood Sugar _____ times a day  Blood Thinner Instructions: Aspirin Instructions: Last Dose:  Anesthesia review: Hx. CAD,HTN,Dysrhythmia.  Patient denies shortness of breath, fever, cough and chest pain at PAT appointment   Patient verbalized understanding of instructions that were given to them at the PAT appointment. Patient was also instructed that they will need to review over the PAT instructions again at home before surgery.

## 2019-09-25 DIAGNOSIS — I10 Essential (primary) hypertension: Secondary | ICD-10-CM | POA: Diagnosis not present

## 2019-09-25 DIAGNOSIS — E039 Hypothyroidism, unspecified: Secondary | ICD-10-CM | POA: Diagnosis not present

## 2019-09-25 DIAGNOSIS — Z01818 Encounter for other preprocedural examination: Secondary | ICD-10-CM | POA: Diagnosis not present

## 2019-09-25 DIAGNOSIS — R011 Cardiac murmur, unspecified: Secondary | ICD-10-CM | POA: Diagnosis not present

## 2019-09-25 DIAGNOSIS — K219 Gastro-esophageal reflux disease without esophagitis: Secondary | ICD-10-CM | POA: Diagnosis not present

## 2019-09-25 NOTE — Progress Notes (Signed)
Type and screen needs to be collected again the day of surgery,due to antibodies as per blood bank.

## 2019-09-30 NOTE — Anesthesia Preprocedure Evaluation (Addendum)
Anesthesia Evaluation  Patient identified by MRN, date of birth, ID band Patient awake    Reviewed: Allergy & Precautions, NPO status , Patient's Chart, lab work & pertinent test results  Airway Mallampati: II  TM Distance: >3 FB Neck ROM: Full    Dental no notable dental hx.    Pulmonary neg pulmonary ROS, former smoker,    Pulmonary exam normal breath sounds clear to auscultation       Cardiovascular hypertension, Pt. on medications Normal cardiovascular exam Rhythm:Regular Rate:Normal     Neuro/Psych negative neurological ROS  negative psych ROS   GI/Hepatic Neg liver ROS, GERD  Medicated,  Endo/Other  Hypothyroidism   Renal/GU negative Renal ROS  negative genitourinary   Musculoskeletal negative musculoskeletal ROS (+)   Abdominal   Peds negative pediatric ROS (+)  Hematology negative hematology ROS (+)   Anesthesia Other Findings   Reproductive/Obstetrics negative OB ROS                            Anesthesia Physical Anesthesia Plan  ASA: III  Anesthesia Plan: General   Post-op Pain Management:    Induction: Intravenous  PONV Risk Score and Plan: 3 and Ondansetron, Dexamethasone and Treatment may vary due to age or medical condition  Airway Management Planned: LMA  Additional Equipment:   Intra-op Plan:   Post-operative Plan: Extubation in OR  Informed Consent: I have reviewed the patients History and Physical, chart, labs and discussed the procedure including the risks, benefits and alternatives for the proposed anesthesia with the patient or authorized representative who has indicated his/her understanding and acceptance.     Dental advisory given  Plan Discussed with: CRNA and Surgeon  Anesthesia Plan Comments: (See PAT note 09/24/2019, Konrad Felix, PA-C)       Anesthesia Quick Evaluation

## 2019-09-30 NOTE — Progress Notes (Signed)
Anesthesia Chart Review   Case: [id number] Date/Time: 10/06/19 1015   Procedure: TOTAL KNEE ARTHROPLASTY (Right Knee) - 6min   Anesthesia type: Choice   Pre-op diagnosis: Right knee osteoarthritis   [facility] ROOM 10 / [facility]   Surgeons: Gaynelle Arabian, MD      DISCUSSION:86 y.o. former smoker (quit 08/31/48) with h/o HTN, GERD, hypothyroidism, CAD (Chest CTA 10/07: 40% or less pLAD;  ETT 8/09: negative), asthma, right knee OA scheduled for above procedure 10/06/2019 with Dr. Gaynelle Arabian.    Cardiology pre-operative risk assessment received 08/25/2019.  Per telephone encounter, "Chart reviewed as part of pre-operative protocol coverage. Patient was contacted 08/25/2019 in reference to pre-operative risk assessment for pending surgery as outlined below.  Kerri [name] was last seen on 03/31/2019 by Dr. Percival Spanish.  Since that day, Kerri [name] has done fine from a cardiac standpoint. She has been quite limited in mobility by knee pain, though can still complete 4 METs without anginal complaints.  Therefore, based on ACC/AHA guidelines, the patient would be at acceptable risk for the planned procedure without further cardiovascular testing."  Anticipate pt can proceed with planned procedure barring acute status change.   VS: BP 140/71   Pulse 71   Temp 36.4 C (Oral)   Resp 16   Ht 5\' 5"  (1.651 m)   Wt 61.2 kg   SpO2 97%   BMI 22.47 kg/m   PROVIDERS: Lavone Orn, MD is PCP   Minus Breeding, MD is Cardiologist last seen 03/31/2019 LABS: Labs reviewed: Acceptable for surgery. (all labs ordered are listed, but only abnormal results are displayed)  Labs Reviewed  CBC - Abnormal; Notable for the following components:      Result Value   RBC 3.81 (*)    RDW 15.7 (*)    All other components within normal limits  COMPREHENSIVE METABOLIC PANEL - Abnormal; Notable for the following components:   Glucose, Bld 107 (*)    GFR calc non Af Amer 60 (*)    All other components within normal limits   SURGICAL PCR SCREEN  APTT  PROTIME-INR  TYPE AND SCREEN     IMAGES:   EKG: 12/09/2018 Rate 74 bpm Sinus rhythm  Ventricular premature complex Abnormal R-wave progression, early transition Borderline T abnormalities, anterior leads   CV: Echo 02/14/2019 IMPRESSIONS    1. Left ventricular ejection fraction, by visual estimation, is 55 to  60%. The left ventricle has normal function. Normal left ventricular size.  There is no left ventricular hypertrophy.  2. Left ventricular diastolic Doppler parameters are consistent with  impaired relaxation pattern of LV diastolic filling.  3. Global right ventricle has normal systolic function.The right  ventricular size is normal.  4. Left atrial size was normal.  5. Right atrial size was normal.  6. The mitral valve is normal in structure. Mild mitral valve  regurgitation. No evidence of mitral stenosis.  7. The tricuspid valve is normal in structure. Tricuspid valve  regurgitation is trivial.  8. The aortic valve is tricuspid Aortic valve regurgitation is trivial by  color flow Doppler. Mild to moderate aortic valve sclerosis/calcification  without any evidence of aortic stenosis.  9. The pulmonic valve was not well visualized. Pulmonic valve  regurgitation is not visualized by color flow Doppler.  10. Aortic dilatation noted.  11. There is mild dilatation of the ascending aorta measuring 39 mm.  12. Normal pulmonary artery systolic pressure.  13. The inferior vena cava is normal in size with greater than  50%  respiratory variability, suggesting right atrial pressure of 3 mmHg.  14. Normal LV systolic function; grade 1 diastolic dysfunction; mildly  dilated ascending aorta; calcified aortic valve with no significant AS by  doppler; trace AI; mild MR.  Past Medical History:  Diagnosis Date  . Adenomatous colon polyp   . Asthma   . Basal cell carcinoma 03/13/2017   left forehead - CX3 + cautery + 5FU  . CAD (coronary  artery disease)    Chest CTA 10/07: 40% or less pLAD;  ETT 8/09: negative  . Carotid bruit   . Chronic back pain   . Depression   . Diverticulosis   . DJD (degenerative joint disease)   . Dyslipidemia   . Dysrhythmia    pvcs  . Fatty liver   . GERD (gastroesophageal reflux disease)   . HTN (hypertension)   . Hypothyroidism   . IBS (irritable bowel syndrome)   . Internal hemorrhoid   . Melanoma ([place])    metastatic; left leg; s/p multiple excisions  . Migraine   . Osteoporosis   . Spinal stenosis   . Squamous cell carcinoma of skin 09/02/2015   mid chest - CX3 + 5FU  . Squamous cell carcinoma of skin 11/07/2017   left wrist - tx p bx  . Squamous cell carcinoma of skin 12/11/2017   left hand - tx p bx    Past Surgical History:  Procedure Laterality Date  . BACK SURGERY    . BLEPHAROPLASTY    . BUNIONECTOMY WITH HAMMERTOE RECONSTRUCTION Right 01/16/2013   Procedure: RIGHT Barbie Banner OSTEOTOMY WITH SIMPLE BUNIONECTOMY AND SECOND HAMMER TOE CORRECTION;  Surgeon: Wylene Simmer, MD;  Location: [place];  Service: Orthopedics;  Laterality: Right;  . CARPAL TUNNEL RELEASE    . KNEE ARTHROSCOPY Right   . LUMBAR LAMINECTOMY/DECOMPRESSION MICRODISCECTOMY N/A 11/13/2013   Procedure: LUMBAR TWO-THREE,LUMBAR THREE-FOUR,LUMBAR FOUR-FIVE LAMINECTOMY AND FORAMINOTOMY;  Surgeon: Ophelia Charter, MD;  Location: [place] [facility];  Service: Neurosurgery;  Laterality: N/A;  . MELANOMA EXCISION Left 09/19/2012   Procedure: WIDE EXCISION METASTATIC MELANOMA LEFT MEDIAL THIGH;  Surgeon: Zenovia Jarred, MD;  Location: [place];  Service: General;  Laterality: Left;  . ROTATOR CUFF REPAIR    . SHOULDER SURGERY    . TOE SURGERY    . TOTAL HIP ARTHROPLASTY  09/04/2011   Procedure: TOTAL HIP ARTHROPLASTY;  Surgeon: Gearlean Alf, MD;  [facility];  Service: Orthopedics;  Laterality: Left;  . TOTAL HIP REVISION Left 11/27/2018   Procedure: Left hip femoral revision;  Surgeon:  Gaynelle Arabian, MD;  Location[facility];  Service: Orthopedics;  Laterality: Left;  143min    MEDICATIONS: . acetaminophen (TYLENOL) 500 MG tablet  . alendronate (FOSAMAX) 70 MG tablet  . Biotin 5 MG CAPS  . calcium carbonate (OS-CAL) 1250 (500 Ca) MG chewable tablet  . chlorthalidone (HYGROTON) 25 MG tablet  . Cholecalciferol (VITAMIN D3) 50 MCG (2000 UT) TABS  . esomeprazole (NEXIUM) 20 MG capsule  . estradiol (ESTRACE) 0.1 MG/GM vaginal cream  . fluticasone (FLONASE) 50 MCG/ACT nasal spray  . gabapentin (NEURONTIN) 300 MG capsule  . levothyroxine (SYNTHROID, LEVOTHROID) 75 MCG tablet  . loratadine (CLARITIN) 10 MG tablet  . Magnesium (V-R MAGNESIUM) 250 MG TABS  . methocarbamol (ROBAXIN) 500 MG tablet  . Naproxen Sod-diphenhydrAMINE (ALEVE PM) 220-25 MG TABS  . polyethylene glycol powder (MIRALAX) 17 GM/SCOOP powder  . potassium chloride SA (KLOR-CON) 20 MEQ tablet  . Probiotic  Product (PROBIOTIC PO)  . rosuvastatin (CRESTOR) 10 MG tablet  . tretinoin (RETIN-A) 0.05 % cream  . trimethoprim (TRIMPEX) 100 MG tablet  . vitamin B-12 (CYANOCOBALAMIN) 500 MCG tablet   No current facility-administered medications for this encounter.    Maia Plan [facility] Pre-Surgical Testing [phone] 09/30/19  3:21 PM

## 2019-10-01 NOTE — H&P (Signed)
TOTAL KNEE ADMISSION H&P  Patient is being admitted for right total knee arthroplasty.  Subjective:  Chief Complaint: Right knee pain.  HPI: Kerri [name], 86 y.o. female has a history of pain and functional disability in the right knee due to arthritis and has failed non-surgical conservative treatments for greater than 12 weeks to include corticosteriod injections, viscosupplementation injections and activity modification. Onset of symptoms was gradual, starting several years ago with gradually worsening course since that time. The patient noted no past surgery on the right knee.  Patient currently rates pain in the right knee at 7 out of 10 with activity. Patient has worsening of pain with activity and weight bearing, pain that interferes with activities of daily living and crepitus. Patient has evidence of bone-on-bone in the medial and patellofemoral compartments on the right with minimal varus deformity. by imaging studies. There is no active infection.  Patient Active Problem List   Diagnosis Date Noted  . Palpitations 03/29/2019  . Failed total hip arthroplasty ([place]) 11/27/2018  . Melanoma ([place])   . Dizziness 01/30/2017  . Lumbar stenosis with neurogenic claudication 11/13/2013  . Sebaceous cyst 11/20/2012  . Squamous cell carcinoma in situ of skin of calf 04/03/2012  . Postop Hypokalemia 09/08/2011  . Postop Acute blood loss anemia 09/05/2011  . Osteoarthritis of hip 09/04/2011  . Melanoma of lower leg ([place]) 05/03/2011  . PVC's (premature ventricular contractions) 07/30/2009  . HYPOTHYROIDISM 05/10/2009  . DYSLIPIDEMIA 05/10/2009  . Essential hypertension 05/10/2009  . CAD 05/10/2009  . DEGENERATIVE JOINT DISEASE 05/10/2009  . SPINAL STENOSIS 05/10/2009  . CAROTID BRUIT 05/10/2009    Past Medical History:  Diagnosis Date  . Adenomatous colon polyp   . Asthma   . Basal cell carcinoma 03/13/2017   left forehead - CX3 + cautery + 5FU  . CAD (coronary artery disease)    Chest CTA 10/07: 40% or less pLAD;  ETT 8/09: negative  . Carotid bruit   . Chronic back pain   . Depression   . Diverticulosis   . DJD (degenerative joint disease)   . Dyslipidemia   . Dysrhythmia    pvcs  . Fatty liver   . GERD (gastroesophageal reflux disease)   . HTN (hypertension)   . Hypothyroidism   . IBS (irritable bowel syndrome)   . Internal hemorrhoid   . Melanoma ([place])    metastatic; left leg; s/p multiple excisions  . Migraine   . Osteoporosis   . Spinal stenosis   . Squamous cell carcinoma of skin 09/02/2015   mid chest - CX3 + 5FU  . Squamous cell carcinoma of skin 11/07/2017   left wrist - tx p bx  . Squamous cell carcinoma of skin 12/11/2017   left hand - tx p bx    Past Surgical History:  Procedure Laterality Date  . BACK SURGERY    . BLEPHAROPLASTY    . BUNIONECTOMY WITH HAMMERTOE RECONSTRUCTION Right 01/16/2013   Procedure: RIGHT Barbie Banner OSTEOTOMY WITH SIMPLE BUNIONECTOMY AND SECOND HAMMER TOE CORRECTION;  Surgeon: Wylene Simmer, MD;  Location: [facility];  Service: Orthopedics;  Laterality: Right;  . CARPAL TUNNEL RELEASE    . KNEE ARTHROSCOPY Right   . LUMBAR LAMINECTOMY/DECOMPRESSION MICRODISCECTOMY N/A 11/13/2013   Procedure: LUMBAR TWO-THREE,LUMBAR THREE-FOUR,LUMBAR FOUR-FIVE LAMINECTOMY AND FORAMINOTOMY;  Surgeon: Ophelia Charter, MD;  Location: [facility];  Service: Neurosurgery;  Laterality: N/A;  . MELANOMA EXCISION Left 09/19/2012   Procedure: WIDE EXCISION METASTATIC MELANOMA LEFT MEDIAL THIGH;  Surgeon: Lavone Neri  Ellison Carwin, MD;  Location: [facility];  Service: General;  Laterality: Left;  . ROTATOR CUFF REPAIR    . SHOULDER SURGERY    . TOE SURGERY    . TOTAL HIP ARTHROPLASTY  09/04/2011   Procedure: TOTAL HIP ARTHROPLASTY;  Surgeon: Gearlean Alf, MD;  Location: [facility];  Service: Orthopedics;  Laterality: Left;  . TOTAL HIP REVISION Left 11/27/2018   Procedure: Left hip femoral revision;  Surgeon: Gaynelle Arabian, MD;   Location: [facility];  Service: Orthopedics;  Laterality: Left;  133min    Prior to Admission medications   Medication Sig Start Date End Date Taking? Authorizing Provider  acetaminophen (TYLENOL) 500 MG tablet Take 500 mg by mouth daily.   Yes [provider]  alendronate (FOSAMAX) 70 MG tablet Take 70 mg by mouth every 7 (seven) days. 11/15/18  Yes [provider]  Biotin 5 MG CAPS Take 5 mg by mouth daily.   Yes [provider]  calcium carbonate (OS-CAL) 1250 (500 Ca) MG chewable tablet Chew 2 tablets by mouth daily.    Yes [provider]  chlorthalidone (HYGROTON) 25 MG tablet Take 1 tablet (25 mg total) by mouth daily. Only takes if bp over [id number] systolic Patient taking differently: Take 25 mg by mouth daily as needed. Only takes if bp over [id number] systolic [id number]  Yes Hochrein, Jeneen Rinks, MD  Cholecalciferol (VITAMIN D3) 50 MCG (2000 UT) TABS Take 2,000 Units by mouth daily.   Yes [provider]  esomeprazole (NEXIUM) 20 MG capsule Take 20 mg by mouth daily.    Yes [provider]  estradiol (ESTRACE) 0.1 MG/GM vaginal cream Place 1 Applicatorful vaginally every other day.  06/06/19  Yes [provider]  fluticasone (FLONASE) 50 MCG/ACT nasal spray Place 2 sprays into both nostrils every morning.  03/29/19  Yes [provider]  gabapentin (NEURONTIN) 300 MG capsule Take 300 mg by mouth 2 (two) times daily.    Yes [provider]  levothyroxine (SYNTHROID, LEVOTHROID) 75 MCG tablet Take 75 mcg by mouth daily with breakfast.   Yes [provider]  loratadine (CLARITIN) 10 MG tablet Take 10 mg by mouth daily.    Yes [provider]  Magnesium (V-R MAGNESIUM) 250 MG TABS Take 250 mg by mouth daily.    Yes [provider]  Naproxen Sod-diphenhydrAMINE (ALEVE PM) 220-25 MG TABS Take 1 tablet by mouth at bedtime.    Yes [provider]  polyethylene glycol powder (MIRALAX) 17 GM/SCOOP powder  Take 8.5 g by mouth daily.    Yes [provider]  potassium chloride SA (KLOR-CON) 20 MEQ tablet Take 20 mEq by mouth daily as needed (takes with HCTZ).    Yes [provider]  Probiotic Product (PROBIOTIC PO) Take 1 capsule by mouth daily.   Yes [provider]  rosuvastatin (CRESTOR) 10 MG tablet Take 10 mg by mouth at bedtime.    Yes [provider]  tretinoin (RETIN-A) 0.05 % cream Apply 1 application topically every other day.    Yes [provider]  trimethoprim (TRIMPEX) 100 MG tablet Take 100 mg by mouth daily. UTI prevention 08/04/19  Yes [provider]  vitamin B-12 (CYANOCOBALAMIN) 500 MCG tablet Take 500 mcg by mouth daily.   Yes [provider]  methocarbamol (ROBAXIN) 500 MG tablet Take 1 tablet (500 mg total) by mouth every 6 (six) hours as needed for muscle spasms. Patient not taking: Reported on 09/11/2019 11/29/18  Chana Lindstrom L, PA    Allergies  Allergen Reactions  . Buprenorphine Hcl Itching and Other (See Comments)    Happened a while back. Patient noted that any narcotic based medication makes her have a bad reaction.  . Codeine Nausea And Vomiting and Other (See Comments)    Patient gets sick on stomach  . Morphine And Related Itching and Other (See Comments)    Happened a while back. Patient noted that any narcotic based medication makes her have a bad reaction.  . Tramadol Nausea And Vomiting and Other (See Comments)    Confusion Can tolerate one tablet at night  . Adhesive [Tape] Other (See Comments)    Tears skin   . Hydrocodone-Acetaminophen Nausea And Vomiting  . Lactose Intolerance (Gi) Other (See Comments)    Gi upset   . Oxycodone Nausea And Vomiting    Social History   Socioeconomic History  . Marital status: Married    Spouse name: Not on file  . Number of children: 4  . Years of education: Not on file  . Highest education level: Not on file  Occupational History  .  Occupation: retired  Tobacco Use  . Smoking status: Former Smoker    Types: Cigarettes    Quit date: 08/31/1948    Years since quitting: 71.1  . Smokeless tobacco: Never Used  Substance and Sexual Activity  . Alcohol use: Yes    Alcohol/week: 2.0 standard drinks    Types: 2 Glasses of wine per week  . Drug use: No  . Sexual activity: Never  Other Topics Concern  . Not on file  Social History Narrative  . Not on file   Social Determinants of Health   Financial Resource Strain:   . Difficulty of Paying Living Expenses:   Food Insecurity:   . Worried About Charity fundraiser in the Last Year:   . Arboriculturist in the Last Year:   Transportation Needs:   . Film/video editor (Medical):   Marland Kitchen Lack of Transportation (Non-Medical):   Physical Activity:   . Days of Exercise per Week:   . Minutes of Exercise per Session:   Stress:   . Feeling of Stress :   Social Connections:   . Frequency of Communication with Friends and Family:   . Frequency of Social Gatherings with Friends and Family:   . Attends Religious Services:   . Active Member of Clubs or Organizations:   . Attends Archivist Meetings:   Marland Kitchen Marital Status:   Intimate Partner Violence:   . Fear of Current or Ex-Partner:   . Emotionally Abused:   Marland Kitchen Physically Abused:   . Sexually Abused:       Tobacco Use: Medium Risk  . Smoking Tobacco Use: Former Smoker  . Smokeless Tobacco Use: Never Used   Social History   Substance and Sexual Activity  Alcohol Use Yes  . Alcohol/week: 2.0 standard drinks  . Types: 2 Glasses of wine per week    Family History  Problem Relation Age of Onset  . CAD Mother   . CAD Father   . Kidney disease Child     Review of Systems  Constitutional: Negative for chills and fever.  HENT: Negative for congestion, sore throat and tinnitus.   Eyes: Negative for double vision, photophobia and pain.  Respiratory: Negative for cough, shortness of breath and wheezing.     Cardiovascular: Negative for chest pain, palpitations and orthopnea.  Gastrointestinal: Negative  for heartburn, nausea and vomiting.  Genitourinary: Negative for dysuria, frequency and urgency.  Musculoskeletal: Positive for joint pain.  Neurological: Negative for dizziness, weakness and headaches.    Objective:  Physical Exam: Well nourished and well developed.  General: Alert and oriented x3, cooperative and pleasant, no acute distress.  Head: normocephalic, atraumatic, neck supple.  Eyes: EOMI.  Respiratory: breath sounds clear in all fields, no wheezing, rales, or rhonchi. Cardiovascular: Regular rate and rhythm, no murmurs, gallops or rubs.  Abdomen: non-tender to palpation and soft, normoactive bowel sounds. Musculoskeletal:  Right Knee Exam:  No effusion present. No swelling present. The range of motion is: 5 to 130 degrees.  Marked crepitus on range of motion of the knee.  Medial greater than lateral joint line tenderness. The knee is stable.  Calves soft and nontender. Motor function intact in LE. Strength 5/5 LE bilaterally. Neuro: Distal pulses 2+. Sensation to light touch intact in LE.   Imaging Review Plain radiographs demonstrate severe degenerative joint disease of the right knee. The overall alignment is mild varus. The bone quality appears to be adequate for age and reported activity level.  Assessment/Plan:  End stage arthritis, right knee   The patient history, physical examination, clinical judgment of the provider and imaging studies are consistent with end stage degenerative joint disease of the right knee and total knee arthroplasty is deemed medically necessary. The treatment options including medical management, injection therapy arthroscopy and arthroplasty were discussed at length. The risks and benefits of total knee arthroplasty were presented and reviewed. The risks due to aseptic loosening, infection, stiffness, patella tracking problems,  thromboembolic complications and other imponderables were discussed. The patient acknowledged the explanation, agreed to proceed with the plan and consent was signed. Patient is being admitted for inpatient treatment for surgery, pain control, PT, OT, prophylactic antibiotics, VTE prophylaxis, progressive ambulation and ADLs and discharge planning. The patient is planning to be discharged home.   Patient's anticipated LOS is less than 2 midnights, meeting these requirements: - Younger than 50 - Lives within 1 hour of care - Has a competent adult at home to recover with post-op recover - NO history of  - Chronic pain requiring opiods  - Diabetes  - Coronary Artery Disease  - Heart failure  - Heart attack  - Stroke  - DVT/VTE  - Cardiac arrhythmia  - Respiratory Failure/COPD  - Renal failure  - Anemia  - Advanced Liver disease  Therapy Plans: HHPT to OOPT at [facility] Disposition: Home with daughter Planned DVT Prophylaxis: Aspirin 325 mg BID DME Needed: None PCP: Lavone Orn, MD (appt on 5/6) Cardiologist: Minus Breeding, MD  TXA: IV Allergies: Codeine (N/V), morphine, oxycodone (N/V), tramadol (N/V), adhesive tape Anesthesia Concerns: None BMI: 22.1  Other:  - Patient requesting Nucynta for pain management following surgery - no allergy to dilaudid  - Patient was instructed on what medications to stop prior to surgery. - Follow-up visit in 2 weeks with Dr. Wynelle Link - Begin physical therapy following surgery - Pre-operative lab work as pre-surgical testing - Prescriptions will be provided in hospital at time of discharge  Theresa Duty, PA-C Orthopedic Surgery [facility] [place]

## 2019-10-02 ENCOUNTER — Other Ambulatory Visit (HOSPITAL_COMMUNITY)
Admission: RE | Admit: 2019-10-02 | Discharge: 2019-10-02 | Disposition: A | Discharge status: Home / Self Care | Payer: Medicare Other | Source: Ambulatory Visit | Attending: Orthopedic Surgery | Admitting: Orthopedic Surgery

## 2019-10-02 DIAGNOSIS — Z01812 Encounter for preprocedural laboratory examination: Secondary | ICD-10-CM | POA: Insufficient documentation

## 2019-10-02 DIAGNOSIS — Z20822 Contact with and (suspected) exposure to covid-19: Secondary | ICD-10-CM | POA: Insufficient documentation

## 2019-10-02 LAB — SARS CORONAVIRUS 2 (TAT 6-24 HRS): SARS Coronavirus 2: NEGATIVE

## 2019-10-05 MED ORDER — BUPIVACAINE LIPOSOME 1.3 % IJ SUSP
20.0000 mL | INTRAMUSCULAR | Status: DC
  Filled 2019-10-05: qty 20

## 2019-10-06 ENCOUNTER — Encounter (HOSPITAL_COMMUNITY): Payer: Self-pay | Admitting: Orthopedic Surgery

## 2019-10-06 ENCOUNTER — Encounter (HOSPITAL_COMMUNITY)
Admission: RE | Disposition: A | Discharge status: Home Health | Payer: Self-pay | Source: Home / Self Care | Attending: Orthopedic Surgery

## 2019-10-06 ENCOUNTER — Telehealth (HOSPITAL_COMMUNITY): Payer: Self-pay | Admitting: *Deleted

## 2019-10-06 ENCOUNTER — Other Ambulatory Visit: Payer: Self-pay

## 2019-10-06 ENCOUNTER — Inpatient Hospital Stay (HOSPITAL_COMMUNITY): Payer: Medicare Other | Admitting: Physician Assistant

## 2019-10-06 ENCOUNTER — Inpatient Hospital Stay (HOSPITAL_COMMUNITY)
Admission: RE | Admit: 2019-10-06 | Discharge: 2019-10-09 | DRG: 470 | Disposition: A | Discharge status: Home Health | Payer: Medicare Other | Attending: Orthopedic Surgery | Admitting: Orthopedic Surgery

## 2019-10-06 ENCOUNTER — Inpatient Hospital Stay (HOSPITAL_COMMUNITY): Payer: Medicare Other | Admitting: Anesthesiology

## 2019-10-06 DIAGNOSIS — K5790 Diverticulosis of intestine, part unspecified, without perforation or abscess without bleeding: Secondary | ICD-10-CM | POA: Diagnosis not present

## 2019-10-06 DIAGNOSIS — I10 Essential (primary) hypertension: Secondary | ICD-10-CM | POA: Diagnosis present

## 2019-10-06 DIAGNOSIS — Z885 Allergy status to narcotic agent status: Secondary | ICD-10-CM

## 2019-10-06 DIAGNOSIS — M810 Age-related osteoporosis without current pathological fracture: Secondary | ICD-10-CM | POA: Diagnosis present

## 2019-10-06 DIAGNOSIS — Z85820 Personal history of malignant melanoma of skin: Secondary | ICD-10-CM

## 2019-10-06 DIAGNOSIS — G8929 Other chronic pain: Secondary | ICD-10-CM | POA: Diagnosis not present

## 2019-10-06 DIAGNOSIS — M1711 Unilateral primary osteoarthritis, right knee: Principal | ICD-10-CM | POA: Diagnosis present

## 2019-10-06 DIAGNOSIS — K589 Irritable bowel syndrome without diarrhea: Secondary | ICD-10-CM | POA: Diagnosis not present

## 2019-10-06 DIAGNOSIS — Z79890 Hormone replacement therapy (postmenopausal): Secondary | ICD-10-CM

## 2019-10-06 DIAGNOSIS — K219 Gastro-esophageal reflux disease without esophagitis: Secondary | ICD-10-CM | POA: Diagnosis not present

## 2019-10-06 DIAGNOSIS — I2510 Atherosclerotic heart disease of native coronary artery without angina pectoris: Secondary | ICD-10-CM | POA: Diagnosis not present

## 2019-10-06 DIAGNOSIS — E785 Hyperlipidemia, unspecified: Secondary | ICD-10-CM | POA: Diagnosis not present

## 2019-10-06 DIAGNOSIS — M549 Dorsalgia, unspecified: Secondary | ICD-10-CM | POA: Diagnosis not present

## 2019-10-06 DIAGNOSIS — M179 Osteoarthritis of knee, unspecified: Secondary | ICD-10-CM

## 2019-10-06 DIAGNOSIS — Z87891 Personal history of nicotine dependence: Secondary | ICD-10-CM

## 2019-10-06 DIAGNOSIS — Z96642 Presence of left artificial hip joint: Secondary | ICD-10-CM | POA: Diagnosis present

## 2019-10-06 DIAGNOSIS — K760 Fatty (change of) liver, not elsewhere classified: Secondary | ICD-10-CM | POA: Diagnosis present

## 2019-10-06 DIAGNOSIS — G8918 Other acute postprocedural pain: Secondary | ICD-10-CM | POA: Diagnosis not present

## 2019-10-06 DIAGNOSIS — M1710 Unilateral primary osteoarthritis, unspecified knee: Secondary | ICD-10-CM

## 2019-10-06 DIAGNOSIS — Z8249 Family history of ischemic heart disease and other diseases of the circulatory system: Secondary | ICD-10-CM

## 2019-10-06 DIAGNOSIS — Z888 Allergy status to other drugs, medicaments and biological substances status: Secondary | ICD-10-CM | POA: Diagnosis not present

## 2019-10-06 DIAGNOSIS — Z79899 Other long term (current) drug therapy: Secondary | ICD-10-CM

## 2019-10-06 DIAGNOSIS — Z7983 Long term (current) use of bisphosphonates: Secondary | ICD-10-CM

## 2019-10-06 DIAGNOSIS — E039 Hypothyroidism, unspecified: Secondary | ICD-10-CM | POA: Diagnosis not present

## 2019-10-06 DIAGNOSIS — G43909 Migraine, unspecified, not intractable, without status migrainosus: Secondary | ICD-10-CM | POA: Diagnosis not present

## 2019-10-06 HISTORY — PX: TOTAL KNEE ARTHROPLASTY: SHX125

## 2019-10-06 LAB — TYPE AND SCREEN
ABO/RH(D): A POS
Antibody Screen: NEGATIVE
Unit division: 0
Unit division: 0

## 2019-10-06 LAB — BPAM RBC
Blood Product Expiration Date: 202106042359
Blood Product Expiration Date: 202106052359
Unit Type and Rh: 6200
Unit Type and Rh: 6200

## 2019-10-06 SURGERY — ARTHROPLASTY, KNEE, TOTAL
Anesthesia: General | Site: Knee | Laterality: Right

## 2019-10-06 MED ORDER — MIDAZOLAM HCL 5 MG/5ML IJ SOLN
INTRAMUSCULAR | Status: DC | PRN
  Administered 2019-10-06: .5 mg via INTRAVENOUS

## 2019-10-06 MED ORDER — SODIUM CHLORIDE (PF) 0.9 % IJ SOLN
INTRAMUSCULAR | Status: DC | PRN
  Administered 2019-10-06: 60 mL

## 2019-10-06 MED ORDER — BUPIVACAINE LIPOSOME 1.3 % IJ SUSP
INTRAMUSCULAR | Status: DC | PRN
  Administered 2019-10-06: 20 mL

## 2019-10-06 MED ORDER — HYDROMORPHONE HCL 1 MG/ML IJ SOLN
0.2500 mg | INTRAMUSCULAR | Status: DC | PRN
  Administered 2019-10-06 (×2): 0.5 mg via INTRAVENOUS

## 2019-10-06 MED ORDER — HYDROMORPHONE HCL 1 MG/ML IJ SOLN
0.5000 mg | INTRAMUSCULAR | Status: DC | PRN
  Administered 2019-10-06: 1 mg via INTRAVENOUS
  Filled 2019-10-06: qty 1

## 2019-10-06 MED ORDER — FENTANYL CITRATE (PF) 100 MCG/2ML IJ SOLN
INTRAMUSCULAR | Status: AC
  Administered 2019-10-06: 75 ug
  Filled 2019-10-06: qty 2

## 2019-10-06 MED ORDER — CEFAZOLIN SODIUM-DEXTROSE 2-4 GM/100ML-% IV SOLN
2.0000 g | INTRAVENOUS | Status: AC
  Administered 2019-10-06: 2 g via INTRAVENOUS
  Filled 2019-10-06: qty 100

## 2019-10-06 MED ORDER — ROSUVASTATIN CALCIUM 10 MG PO TABS
10.0000 mg | ORAL_TABLET | Freq: Every day | ORAL | Status: DC
  Administered 2019-10-07 – 2019-10-08 (×3): 10 mg via ORAL
  Filled 2019-10-06 (×3): qty 1

## 2019-10-06 MED ORDER — CHLORHEXIDINE GLUCONATE 0.12 % MT SOLN
15.0000 mL | Freq: Once | OROMUCOSAL | Status: AC
  Administered 2019-10-06: 15 mL via OROMUCOSAL

## 2019-10-06 MED ORDER — TRANEXAMIC ACID-NACL 1000-0.7 MG/100ML-% IV SOLN
1000.0000 mg | INTRAVENOUS | Status: AC
  Administered 2019-10-06: 1000 mg via INTRAVENOUS
  Filled 2019-10-06: qty 100

## 2019-10-06 MED ORDER — POTASSIUM CHLORIDE CRYS ER 20 MEQ PO TBCR
20.0000 meq | EXTENDED_RELEASE_TABLET | Freq: Every day | ORAL | Status: DC | PRN
  Administered 2019-10-08: 20 meq via ORAL
  Filled 2019-10-06 (×2): qty 1

## 2019-10-06 MED ORDER — TAPENTADOL HCL 50 MG PO TABS
50.0000 mg | ORAL_TABLET | ORAL | Status: DC | PRN
  Administered 2019-10-06: 100 mg via ORAL
  Administered 2019-10-07: 50 mg via ORAL
  Administered 2019-10-07: 100 mg via ORAL
  Administered 2019-10-08 – 2019-10-09 (×6): 50 mg via ORAL
  Filled 2019-10-06: qty 2
  Filled 2019-10-06 (×3): qty 1
  Filled 2019-10-06 (×2): qty 2
  Filled 2019-10-06 (×3): qty 1

## 2019-10-06 MED ORDER — LEVOTHYROXINE SODIUM 75 MCG PO TABS
75.0000 ug | ORAL_TABLET | Freq: Every day | ORAL | Status: DC
  Administered 2019-10-07 – 2019-10-09 (×3): 75 ug via ORAL
  Filled 2019-10-06 (×3): qty 1

## 2019-10-06 MED ORDER — SODIUM CHLORIDE (PF) 0.9 % IJ SOLN
INTRAMUSCULAR | Status: AC
  Filled 2019-10-06: qty 50

## 2019-10-06 MED ORDER — PHENOL 1.4 % MT LIQD: 1.0000 | OROMUCOSAL | Status: DC | PRN

## 2019-10-06 MED ORDER — POLYETHYLENE GLYCOL 3350 17 G PO PACK
17.0000 g | PACK | Freq: Every day | ORAL | Status: DC | PRN
  Administered 2019-10-08 – 2019-10-09 (×2): 17 g via ORAL
  Filled 2019-10-06 (×2): qty 1

## 2019-10-06 MED ORDER — POVIDONE-IODINE 10 % EX SWAB
2.0000 "application " | Freq: Once | CUTANEOUS | Status: AC
  Administered 2019-10-06: 2 via TOPICAL

## 2019-10-06 MED ORDER — FLEET ENEMA 7-19 GM/118ML RE ENEM: 1.0000 | ENEMA | Freq: Once | RECTAL | Status: DC | PRN

## 2019-10-06 MED ORDER — METOCLOPRAMIDE HCL 5 MG/ML IJ SOLN
5.0000 mg | Freq: Three times a day (TID) | INTRAMUSCULAR | Status: DC | PRN
  Filled 2019-10-06: qty 2

## 2019-10-06 MED ORDER — LACTATED RINGERS IV SOLN: INTRAVENOUS | Status: DC

## 2019-10-06 MED ORDER — DIPHENHYDRAMINE HCL 12.5 MG/5ML PO ELIX
12.5000 mg | ORAL_SOLUTION | ORAL | Status: DC | PRN
  Administered 2019-10-06: 25 mg via ORAL
  Administered 2019-10-07: 12.5 mg via ORAL
  Filled 2019-10-06 (×2): qty 10

## 2019-10-06 MED ORDER — BUPIVACAINE HCL (PF) 0.5 % IJ SOLN
INTRAMUSCULAR | Status: DC | PRN
Start: 2019-10-06 — End: 2019-10-06
  Administered 2019-10-06: 20 mL via PERINEURAL

## 2019-10-06 MED ORDER — PROPOFOL 10 MG/ML IV BOLUS
INTRAVENOUS | Status: AC
  Filled 2019-10-06: qty 20

## 2019-10-06 MED ORDER — HYDROMORPHONE HCL 1 MG/ML IJ SOLN
INTRAMUSCULAR | Status: AC
  Filled 2019-10-06: qty 1

## 2019-10-06 MED ORDER — ONDANSETRON HCL 4 MG PO TABS: 4.0000 mg | ORAL_TABLET | Freq: Four times a day (QID) | ORAL | Status: DC | PRN

## 2019-10-06 MED ORDER — LORATADINE 10 MG PO TABS
10.0000 mg | ORAL_TABLET | Freq: Every day | ORAL | Status: DC
  Administered 2019-10-07 – 2019-10-09 (×3): 10 mg via ORAL
  Filled 2019-10-06 (×3): qty 1

## 2019-10-06 MED ORDER — METOCLOPRAMIDE HCL 5 MG PO TABS: 5.0000 mg | ORAL_TABLET | Freq: Three times a day (TID) | ORAL | Status: DC | PRN

## 2019-10-06 MED ORDER — DEXAMETHASONE SODIUM PHOSPHATE 10 MG/ML IJ SOLN
8.0000 mg | Freq: Once | INTRAMUSCULAR | Status: AC
  Administered 2019-10-06: 6 mg via INTRAVENOUS

## 2019-10-06 MED ORDER — MIDAZOLAM HCL 2 MG/2ML IJ SOLN
INTRAMUSCULAR | Status: AC
  Filled 2019-10-06: qty 2

## 2019-10-06 MED ORDER — STERILE WATER FOR IRRIGATION IR SOLN
Status: DC | PRN
  Administered 2019-10-06: 2000 mL

## 2019-10-06 MED ORDER — FENTANYL CITRATE (PF) 250 MCG/5ML IJ SOLN
INTRAMUSCULAR | Status: AC
  Filled 2019-10-06: qty 5

## 2019-10-06 MED ORDER — ACETAMINOPHEN 10 MG/ML IV SOLN
1000.0000 mg | Freq: Four times a day (QID) | INTRAVENOUS | Status: DC
  Administered 2019-10-06: 1000 mg via INTRAVENOUS
  Filled 2019-10-06: qty 100

## 2019-10-06 MED ORDER — PHENYLEPHRINE HCL-NACL 10-0.9 MG/250ML-% IV SOLN
INTRAVENOUS | Status: DC | PRN
  Administered 2019-10-06: 25 ug/min via INTRAVENOUS

## 2019-10-06 MED ORDER — CEFAZOLIN SODIUM-DEXTROSE 2-4 GM/100ML-% IV SOLN
2.0000 g | Freq: Four times a day (QID) | INTRAVENOUS | Status: AC
  Administered 2019-10-06 (×2): 2 g via INTRAVENOUS
  Filled 2019-10-06 (×2): qty 100

## 2019-10-06 MED ORDER — ONDANSETRON HCL 4 MG/2ML IJ SOLN
4.0000 mg | Freq: Four times a day (QID) | INTRAMUSCULAR | Status: DC | PRN
  Administered 2019-10-06: 4 mg via INTRAVENOUS
  Filled 2019-10-06: qty 2

## 2019-10-06 MED ORDER — DOCUSATE SODIUM 100 MG PO CAPS
100.0000 mg | ORAL_CAPSULE | Freq: Two times a day (BID) | ORAL | Status: DC
  Administered 2019-10-07 – 2019-10-09 (×4): 100 mg via ORAL
  Filled 2019-10-06 (×5): qty 1

## 2019-10-06 MED ORDER — ONDANSETRON HCL 4 MG/2ML IJ SOLN
INTRAMUSCULAR | Status: AC
  Filled 2019-10-06: qty 2

## 2019-10-06 MED ORDER — FENTANYL CITRATE (PF) 100 MCG/2ML IJ SOLN
INTRAMUSCULAR | Status: DC | PRN
  Administered 2019-10-06: 25 ug via INTRAVENOUS
  Administered 2019-10-06 (×2): 50 ug via INTRAVENOUS

## 2019-10-06 MED ORDER — DEXAMETHASONE SODIUM PHOSPHATE 10 MG/ML IJ SOLN
INTRAMUSCULAR | Status: AC
  Filled 2019-10-06: qty 1

## 2019-10-06 MED ORDER — METHOCARBAMOL 500 MG IVPB - SIMPLE MED
500.0000 mg | Freq: Four times a day (QID) | INTRAVENOUS | Status: DC | PRN
  Filled 2019-10-06: qty 50

## 2019-10-06 MED ORDER — MENTHOL 3 MG MT LOZG: 1.0000 | LOZENGE | OROMUCOSAL | Status: DC | PRN

## 2019-10-06 MED ORDER — DEXAMETHASONE SODIUM PHOSPHATE 10 MG/ML IJ SOLN
10.0000 mg | Freq: Once | INTRAMUSCULAR | Status: AC
  Administered 2019-10-07: 10 mg via INTRAVENOUS
  Filled 2019-10-06: qty 1

## 2019-10-06 MED ORDER — GABAPENTIN 300 MG PO CAPS
300.0000 mg | ORAL_CAPSULE | Freq: Two times a day (BID) | ORAL | Status: DC
  Administered 2019-10-06 – 2019-10-09 (×7): 300 mg via ORAL
  Filled 2019-10-06 (×7): qty 1

## 2019-10-06 MED ORDER — SODIUM CHLORIDE 0.9 % IR SOLN
Status: DC | PRN
  Administered 2019-10-06: 1000 mL

## 2019-10-06 MED ORDER — CHLORTHALIDONE 25 MG PO TABS: 25.0000 mg | ORAL_TABLET | Freq: Every day | ORAL | Status: DC | PRN

## 2019-10-06 MED ORDER — SODIUM CHLORIDE 0.9 % IV SOLN: INTRAVENOUS | Status: DC

## 2019-10-06 MED ORDER — BISACODYL 10 MG RE SUPP: 10.0000 mg | Freq: Every day | RECTAL | Status: DC | PRN

## 2019-10-06 MED ORDER — PROPOFOL 10 MG/ML IV BOLUS
INTRAVENOUS | Status: DC | PRN
  Administered 2019-10-06: 20 mg via INTRAVENOUS
  Administered 2019-10-06: 90 mg via INTRAVENOUS

## 2019-10-06 MED ORDER — LIDOCAINE 2% (20 MG/ML) 5 ML SYRINGE
INTRAMUSCULAR | Status: DC | PRN
  Administered 2019-10-06: 50 mg via INTRAVENOUS

## 2019-10-06 MED ORDER — ACETAMINOPHEN 500 MG PO TABS
1000.0000 mg | ORAL_TABLET | Freq: Four times a day (QID) | ORAL | Status: AC
  Administered 2019-10-06 – 2019-10-07 (×4): 1000 mg via ORAL
  Filled 2019-10-06 (×4): qty 2

## 2019-10-06 MED ORDER — SODIUM CHLORIDE (PF) 0.9 % IJ SOLN
INTRAMUSCULAR | Status: AC
  Filled 2019-10-06: qty 10

## 2019-10-06 MED ORDER — METHOCARBAMOL 500 MG PO TABS: 500.0000 mg | ORAL_TABLET | Freq: Four times a day (QID) | ORAL | Status: DC | PRN

## 2019-10-06 MED ORDER — ONDANSETRON HCL 4 MG/2ML IJ SOLN
INTRAMUSCULAR | Status: DC | PRN
  Administered 2019-10-06: 4 mg via INTRAVENOUS

## 2019-10-06 MED ORDER — ASPIRIN EC 325 MG PO TBEC
325.0000 mg | DELAYED_RELEASE_TABLET | Freq: Two times a day (BID) | ORAL | Status: DC
  Administered 2019-10-07 – 2019-10-09 (×4): 325 mg via ORAL
  Filled 2019-10-06 (×5): qty 1

## 2019-10-06 MED ORDER — 0.9 % SODIUM CHLORIDE (POUR BTL) OPTIME
TOPICAL | Status: DC | PRN
  Administered 2019-10-06: 1000 mL

## 2019-10-06 MED ORDER — PANTOPRAZOLE SODIUM 40 MG PO TBEC: 40.0000 mg | DELAYED_RELEASE_TABLET | Freq: Every day | ORAL | Status: DC

## 2019-10-06 SURGICAL SUPPLY — 57 items
BAG SPEC THK2 15X12 ZIP CLS (MISCELLANEOUS) ×1
BAG ZIPLOCK 12X15 (MISCELLANEOUS) ×2 IMPLANT
BLADE SAG 18X100X1.27 (BLADE) ×2 IMPLANT
BLADE SAW SGTL 11.0X1.19X90.0M (BLADE) ×2 IMPLANT
BLADE SURG SZ10 CARB STEEL (BLADE) ×4 IMPLANT
BNDG CMPR MED 10X6 ELC LF (GAUZE/BANDAGES/DRESSINGS) ×1
BNDG ELASTIC 6X10 VLCR STRL LF (GAUZE/BANDAGES/DRESSINGS) ×2 IMPLANT
BNDG ELASTIC 6X5.8 VLCR STR LF (GAUZE/BANDAGES/DRESSINGS) ×2 IMPLANT
BOWL SMART MIX CTS (DISPOSABLE) ×2 IMPLANT
CEMENT HV SMART SET (Cement) ×4 IMPLANT
CEMENT TIBIA MBT (Knees) ×1 IMPLANT
COVER SURGICAL LIGHT HANDLE (MISCELLANEOUS) ×2 IMPLANT
COVER WAND RF STERILE (DRAPES) IMPLANT
CUFF TOURN SGL QUICK 34 (TOURNIQUET CUFF) ×2
CUFF TRNQT CYL 34X4.125X (TOURNIQUET CUFF) ×1 IMPLANT
DECANTER SPIKE VIAL GLASS SM (MISCELLANEOUS) ×2 IMPLANT
DRAPE U-SHAPE 47X51 STRL (DRAPES) ×2 IMPLANT
DRSG AQUACEL AG ADV 3.5X10 (GAUZE/BANDAGES/DRESSINGS) ×2 IMPLANT
DURAPREP 26ML APPLICATOR (WOUND CARE) ×2 IMPLANT
ELECT REM PT RETURN 15FT ADLT (MISCELLANEOUS) ×2 IMPLANT
EVACUATOR 1/8 PVC DRAIN (DRAIN) IMPLANT
FEMUR SIGMA PS SZ 3.0 R (Femur) ×1 IMPLANT
GAUZE SPONGE 2X2 8PLY STRL LF (GAUZE/BANDAGES/DRESSINGS) ×1 IMPLANT
GLOVE BIO SURGEON STRL SZ7 (GLOVE) ×2 IMPLANT
GLOVE BIO SURGEON STRL SZ8 (GLOVE) ×2 IMPLANT
GLOVE BIOGEL PI IND STRL 7.0 (GLOVE) ×1 IMPLANT
GLOVE BIOGEL PI IND STRL 8 (GLOVE) ×1 IMPLANT
GLOVE BIOGEL PI INDICATOR 7.0 (GLOVE) ×1
GLOVE BIOGEL PI INDICATOR 8 (GLOVE) ×1
GOWN STRL REUS W/TWL LRG LVL3 (GOWN DISPOSABLE) ×4 IMPLANT
HANDPIECE INTERPULSE COAX TIP (DISPOSABLE) ×2
HOLDER FOLEY CATH W/STRAP (MISCELLANEOUS) IMPLANT
IMMOBILIZER KNEE 20 (SOFTGOODS) ×2
IMMOBILIZER KNEE 20 THIGH 36 (SOFTGOODS) ×1 IMPLANT
INSERT TIBIAL PFC SIG SZ3 10MM (Knees) ×2 IMPLANT
KIT TURNOVER KIT A (KITS) IMPLANT
MANIFOLD NEPTUNE II (INSTRUMENTS) ×2 IMPLANT
NS IRRIG 1000ML POUR BTL (IV SOLUTION) ×2 IMPLANT
PACK TOTAL KNEE CUSTOM (KITS) ×2 IMPLANT
PADDING CAST COTTON 6X4 STRL (CAST SUPPLIES) ×2 IMPLANT
PATELLA DOME PFC 38MM (Knees) ×2 IMPLANT
PENCIL SMOKE EVACUATOR (MISCELLANEOUS) IMPLANT
PIN STEINMAN FIXATION KNEE (PIN) ×2 IMPLANT
PROTECTOR NERVE ULNAR (MISCELLANEOUS) ×2 IMPLANT
SET HNDPC FAN SPRY TIP SCT (DISPOSABLE) ×1 IMPLANT
SPONGE GAUZE 2X2 STER 10/PKG (GAUZE/BANDAGES/DRESSINGS) ×1
STRIP CLOSURE SKIN 1/2X4 (GAUZE/BANDAGES/DRESSINGS) ×4 IMPLANT
SUT MNCRL AB 4-0 PS2 18 (SUTURE) ×2 IMPLANT
SUT STRATAFIX 0 PDS 27 VIOLET (SUTURE) ×2
SUT VIC AB 2-0 CT1 27 (SUTURE) ×6
SUT VIC AB 2-0 CT1 TAPERPNT 27 (SUTURE) ×3 IMPLANT
SUTURE STRATFX 0 PDS 27 VIOLET (SUTURE) ×1 IMPLANT
TIBIA MBT CEMENT (Knees) ×2 IMPLANT
TRAY FOLEY MTR SLVR 16FR STAT (SET/KITS/TRAYS/PACK) ×2 IMPLANT
WATER STERILE IRR 1000ML POUR (IV SOLUTION) ×4 IMPLANT
WRAP KNEE MAXI GEL POST OP (GAUZE/BANDAGES/DRESSINGS) ×2 IMPLANT
YANKAUER SUCT BULB TIP 10FT TU (MISCELLANEOUS) ×2 IMPLANT

## 2019-10-06 NOTE — Anesthesia Postprocedure Evaluation (Signed)
Anesthesia Post Note  Patient: Kerri [name]  Procedure(s) Performed: TOTAL KNEE ARTHROPLASTY (Right Knee)     Patient location during evaluation: PACU Anesthesia Type: General Level of consciousness: awake and alert Pain management: pain level controlled Vital Signs Assessment: post-procedure vital signs reviewed and stable Respiratory status: spontaneous breathing, nonlabored ventilation, respiratory function stable and patient connected to nasal cannula oxygen Cardiovascular status: blood pressure returned to baseline and stable Postop Assessment: no apparent nausea or vomiting Anesthetic complications: no    Last Vitals:  Vitals:   10/06/19 1215 10/06/19 1230  BP: (!) 158/79 (!) 157/76  Pulse: 66 65  Resp: 11 13  Temp:    SpO2: 100% 100%    Last Pain:  Vitals:   10/06/19 1230  TempSrc:   PainSc: 5                  Nylia Gavina S

## 2019-10-06 NOTE — Anesthesia Procedure Notes (Signed)
Anesthesia Regional Block: Adductor canal block   Pre-Anesthetic Checklist: ,, timeout performed, Correct Patient, Correct Site, Correct Laterality, Correct Procedure, Correct Position, site marked, Risks and benefits discussed,  Surgical consent,  Pre-op evaluation,  At surgeon's request and post-op pain management  Laterality: Right  Prep: chloraprep       Needles:  Injection technique: Single-shot  Needle Type: Echogenic Needle     Needle Length: 9cm      Additional Needles:   Procedures:,,,, ultrasound used (permanent image in chart),,,,  Narrative:  Start time: 10/06/2019 9:30 AM End time: 10/06/2019 9:35 AM Injection made incrementally with aspirations every 5 mL.  Performed by: Personally  Anesthesiologist: Myrtie Soman, MD  Additional Notes: Patient tolerated the procedure well without complications

## 2019-10-06 NOTE — Progress Notes (Signed)
Orthopedic Tech Progress Note Patient Details:  Kerri [name] [birth year] [id number] CPM removed at 16:35 Patient ID: Kerri [name], female   DOB: 1929/11/24, 86 y.o.   MRN: [id number]   Kerri [name] 10/06/2019, 4:39 PM

## 2019-10-06 NOTE — Evaluation (Addendum)
Physical Therapy Evaluation Patient Details Name: Kerri [name] MRN: [id number] DOB: 1930/04/14 Today's Date: 10/06/2019   History of Present Illness  86 yo female s/p R TKA 10/06/19. hx of chronic pain, osteoporosis, spinal stenosis, L THA 2013, L TH rev 2020, lumbar sg 2015  Clinical Impression  On eval, pt required Mod assist for mobility. She was only able to tolerate standing and a few side steps along bedside. Pt was very drowsy-had difficulty staying awake. She also c/o dizziness at rest and with activity. Unable to safely attempt ambulation on today. Will continue to follow and progress activity as tolerated.     Follow Up Recommendations Follow surgeon's recommendation for DC plan and follow-up therapies    Equipment Recommendations  None recommended by PT    Recommendations for Other Services       Precautions / Restrictions Precautions Precautions: Fall;Knee Required Braces or Orthoses: Knee Immobilizer - Right Knee Immobilizer - Right: Discontinue once straight leg raise with < 10 degree lag Restrictions Weight Bearing Restrictions: No Other Position/Activity Restrictions: WBAT      Mobility  Bed Mobility Overal bed mobility: Needs Assistance Bed Mobility: Supine to Sit     Supine to sit: HOB elevated;Mod assist     General bed mobility comments: Assist for trunk and LEs. Increased time. Cues for safety, technique.  Transfers Overall transfer level: Needs assistance Equipment used: Rolling walker (2 wheeled) Transfers: Sit to/from Stand Sit to Stand: Mod assist;From elevated surface         General transfer comment: Assist to rise, stabilize, control descent. VCs safety, technique, hand/LE placement.  Ambulation/Gait Min Assist; +2 safety/equipment           General Gait Details: side steps along side of bed with RW. Assist to stabilize pt and manage RW. Unable to safely attempt ambulation 2* dizziness + drowsiness.  Stairs             Wheelchair Mobility    Modified Rankin (Stroke Patients Only)       Balance Overall balance assessment: Needs assistance         Standing balance support: Bilateral upper extremity supported Standing balance-Leahy Scale: Poor                               Pertinent Vitals/Pain Pain Assessment: 0-10 Pain Score: 7  Pain Location: R knee Pain Descriptors / Indicators: Sore;Aching Pain Intervention(s): Limited activity within patient's tolerance;Monitored during session;Repositioned    Home Living Family/patient expects to be discharged to:: Private residence Living Arrangements: Alone Available Help at Discharge: Family Type of Home: House Home Access: Stairs to enter Entrance Stairs-Rails: Psychiatric nurse of Steps: 2-garage Home Layout: One level Home Equipment: Other (comment);Cane - single point;Walker - 2 wheels;Toilet riser(3 wheeled walker)      Prior Function Level of Independence: Independent with assistive device(s)               Hand Dominance        Extremity/Trunk Assessment   Upper Extremity Assessment Upper Extremity Assessment: Generalized weakness    Lower Extremity Assessment Lower Extremity Assessment: Generalized weakness    Cervical / Trunk Assessment Cervical / Trunk Assessment: Kyphotic  Communication   Communication: Expressive difficulties  Cognition Arousal/Alertness: Awake/alert(but very drowsy) Behavior During Therapy: WFL for tasks assessed/performed Overall Cognitive Status: Within Functional Limits for tasks assessed  General Comments      Exercises     Assessment/Plan    PT Assessment Patient needs continued PT services  PT Problem List Decreased strength;Decreased range of motion;Decreased mobility;Decreased activity tolerance;Decreased balance;Decreased knowledge of use of DME;Pain;Decreased knowledge of precautions        PT Treatment Interventions DME instruction;Gait training;Therapeutic activities;Therapeutic exercise;Patient/family education;Balance training;Functional mobility training    PT Goals (Current goals can be found in the Care Plan section)  Acute Rehab PT Goals Patient Stated Goal: home PT Goal Formulation: With patient Time For Goal Achievement: 10/20/19 Potential to Achieve Goals: Good    Frequency 7X/week   Barriers to discharge        Co-evaluation               AM-PAC PT "6 Clicks" Mobility  Outcome Measure Help needed turning from your back to your side while in a flat bed without using bedrails?: A Lot Help needed moving from lying on your back to sitting on the side of a flat bed without using bedrails?: A Lot Help needed moving to and from a bed to a chair (including a wheelchair)?: A Lot Help needed standing up from a chair using your arms (e.g., wheelchair or bedside chair)?: A Lot Help needed to walk in hospital room?: A Lot Help needed climbing 3-5 steps with a railing? : A Lot 6 Click Score: 12    End of Session Equipment Utilized During Treatment: Gait belt;Right knee immobilizer Activity Tolerance: Patient limited by fatigue(limited by dizziness) Patient left: in bed;with call bell/phone within reach;with bed alarm set   PT Visit Diagnosis: Other abnormalities of gait and mobility (R26.89);Pain Pain - Right/Left: Right Pain - part of body: Knee    Time: [id number] PT Time Calculation (min) (ACUTE ONLY): 23 min   Charges:   PT Evaluation $PT Eval Low Complexity: 1 Low PT Treatments $Gait: 8-22 mins          Doreatha Massed, PT Acute Rehabilitation

## 2019-10-06 NOTE — Anesthesia Procedure Notes (Signed)
Anesthesia Procedure Image    

## 2019-10-06 NOTE — Transfer of Care (Signed)
Immediate Anesthesia Transfer of Care Note  Patient: Kerri [name]  Procedure(s) Performed: TOTAL KNEE ARTHROPLASTY (Right Knee)  Patient Location: PACU  Anesthesia Type:General  Level of Consciousness: awake, sedated and patient cooperative  Airway & Oxygen Therapy: Patient Spontanous Breathing and Patient connected to face mask oxygen  Post-op Assessment: Report given to RN and Post -op Vital signs reviewed and stable  Post vital signs: stable  Last Vitals:  Vitals Value Taken Time  BP 161/84 10/06/19 1202  Temp    Pulse 68 10/06/19 1209  Resp 11 10/06/19 1209  SpO2 100 % 10/06/19 1209  Vitals shown include unvalidated device data.  Last Pain:  Vitals:   10/06/19 0953  TempSrc:   PainSc: 0-No pain      Patients Stated Pain Goal: 4 ([id number])  Complications: No apparent anesthesia complications

## 2019-10-06 NOTE — Progress Notes (Signed)
Orthopedic Tech Progress Note Patient Details:  Kerri [name] [birth year] [id number]  CPM Right Knee CPM Right Knee: On Right Knee Flexion (Degrees): 40 Right Knee Extension (Degrees): 10  Post Interventions Patient Tolerated: Well Instructions Provided: Care of device Ortho Devices Type of Ortho Device: CPM padding Ortho Device/Splint Interventions: Ordered, Application, Adjustment   Post Interventions Patient Tolerated: Well Instructions Provided: Care of device   Staci Righter 10/06/2019, 12:22 PM

## 2019-10-06 NOTE — Progress Notes (Signed)
Assisted Dr. Rose with right, ultrasound guided, adductor canal block. Side rails up, monitors on throughout procedure. See vital signs in flow sheet. Tolerated Procedure well.  

## 2019-10-06 NOTE — Op Note (Signed)
OPERATIVE REPORT-TOTAL KNEE ARTHROPLASTY   Pre-operative diagnosis- Osteoarthritis  Right knee(s)  Post-operative diagnosis- Osteoarthritis Right knee(s)  Procedure-  Right  Total Knee Arthroplasty  Surgeon- Dione Plover. Kasiya Burck, MD  Assistant- Theresa Duty, PA-C   Anesthesia-  General and adductor canal block  EBL-50 mL   Drains Hemovac  Tourniquet time-  Total Tourniquet Time Documented: Thigh (Right) - 32 minutes Total: Thigh (Right) - 32 minutes     Complications- None  Condition-PACU - hemodynamically stable.   Brief Clinical Note  Kerri [name] is a 86 y.o. year old female with end stage OA of her right knee with progressively worsening pain and dysfunction. She has constant pain, with activity and at rest and significant functional deficits with difficulties even with ADLs. She has had extensive non-op management including analgesics, injections of cortisone and viscosupplements, and home exercise program, but remains in significant pain with significant dysfunction.Radiographs show bone on bone arthritis medial and patellofemoral. She presents now for right Total Knee Arthroplasty.    Procedure in detail---   The patient is brought into the operating room and positioned supine on the operating table. After successful administration of  General and adductor canal block,   a tourniquet is placed high on the  Right thigh(s) and the lower extremity is prepped and draped in the usual sterile fashion. Time out is performed by the operating team and then the  Right lower extremity is wrapped in Esmarch, knee flexed and the tourniquet inflated to 300 mmHg.       A midline incision is made with a ten blade through the subcutaneous tissue to the level of the extensor mechanism. A fresh blade is used to make a medial parapatellar arthrotomy. Soft tissue over the proximal medial tibia is subperiosteally elevated to the joint line with a knife and into the semimembranosus bursa with a  Cobb elevator. Soft tissue over the proximal lateral tibia is elevated with attention being paid to avoiding the patellar tendon on the tibial tubercle. The patella is everted, knee flexed 90 degrees and the ACL and PCL are removed. Findings are bone on bone medial and patellofemoral with large global osteophytes        The drill is used to create a starting hole in the distal femur and the canal is thoroughly irrigated with sterile saline to remove the fatty contents. The 5 degree Right  valgus alignment guide is placed into the femoral canal and the distal femoral cutting block is pinned to remove 10 mm off the distal femur. Resection is made with an oscillating saw.      The tibia is subluxed forward and the menisci are removed. The extramedullary alignment guide is placed referencing proximally at the medial aspect of the tibial tubercle and distally along the second metatarsal axis and tibial crest. The block is pinned to remove 72mm off the more deficient medial  side. Resection is made with an oscillating saw. Size 3is the most appropriate size for the tibia and the proximal tibia is prepared with the modular drill and keel punch for that size.      The femoral sizing guide is placed and size 3 is most appropriate. Rotation is marked off the epicondylar axis and confirmed by creating a rectangular flexion gap at 90 degrees. The size 3 cutting block is pinned in this rotation and the anterior, posterior and chamfer cuts are made with the oscillating saw. The intercondylar block is then placed and that cut is made.  Trial size 3 tibial component, trial size 3 posterior stabilized femur and a 10  mm posterior stabilized rotating platform insert trial is placed. Full extension is achieved with excellent varus/valgus and anterior/posterior balance throughout full range of motion. The patella is everted and thickness measured to be 22  mm. Free hand resection is taken to 12 mm, a 38 template is placed, lug  holes are drilled, trial patella is placed, and it tracks normally. Osteophytes are removed off the posterior femur with the trial in place. All trials are removed and the cut bone surfaces prepared with pulsatile lavage. Cement is mixed and once ready for implantation, the size 3 tibial implant, size  3 posterior stabilized femoral component, and the size 38 patella are cemented in place and the patella is held with the clamp. The trial insert is placed and the knee held in full extension. The Exparel (20 ml mixed with 60 ml saline) is injected into the extensor mechanism, posterior capsule, medial and lateral gutters and subcutaneous tissues.  All extruded cement is removed and once the cement is hard the permanent 10 mm posterior stabilized rotating platform insert is placed into the tibial tray.      The wound is copiously irrigated with saline solution and the extensor mechanism closed over a hemovac drain with #1 V-loc suture. The tourniquet is released for a total tourniquet time of 32  minutes. Flexion against gravity is 140 degrees and the patella tracks normally. Subcutaneous tissue is closed with 2.0 vicryl and subcuticular with running 4.0 Monocryl. The incision is cleaned and dried and steri-strips and a bulky sterile dressing are applied. The limb is placed into a knee immobilizer and the patient is awakened and transported to recovery in stable condition.      Please note that a surgical assistant was a medical necessity for this procedure in order to perform it in a safe and expeditious manner. Surgical assistant was necessary to retract the ligaments and vital neurovascular structures to prevent injury to them and also necessary for proper positioning of the limb to allow for anatomic placement of the prosthesis.   [name], MD    10/06/2019, 11:28 AM

## 2019-10-06 NOTE — Anesthesia Procedure Notes (Signed)
Procedure Name: LMA Insertion Date/Time: 10/06/2019 10:33 AM Performed by: Lissa Morales, CRNA Pre-anesthesia Checklist: Patient identified, Emergency Drugs available, Suction available and Patient being monitored Patient Re-evaluated:Patient Re-evaluated prior to induction Oxygen Delivery Method: Circle system utilized Preoxygenation: Pre-oxygenation with 100% oxygen Induction Type: IV induction Ventilation: Mask ventilation without difficulty LMA: LMA with gastric port inserted LMA Size: 4.0 Tube type: Oral Number of attempts: 1 Airway Equipment and Method: Oral airway Placement Confirmation: positive ETCO2 Tube secured with: Tape Dental Injury: Teeth and Oropharynx as per pre-operative assessment

## 2019-10-06 NOTE — Interval H&P Note (Signed)
History and Physical Interval Note:  10/06/2019 9:34 AM  Kerri [name]  has presented today for surgery, with the diagnosis of Right knee osteoarthritis.  The various methods of treatment have been discussed with the patient and family. After consideration of risks, benefits and other options for treatment, the patient has consented to  Procedure(s) with comments: TOTAL KNEE ARTHROPLASTY (Right) - 2min as a surgical intervention.  The patient's history has been reviewed, patient examined, no change in status, stable for surgery.  I have reviewed the patient's chart and labs.  Questions were answered to the patient's satisfaction.     Pilar Plate Lorrin Bodner

## 2019-10-06 NOTE — Discharge Instructions (Addendum)
 Kerri Aluisio, MD Total Joint Specialist [facility] [address] [place], [place] [address] [phone]  TOTAL KNEE REPLACEMENT POSTOPERATIVE DIRECTIONS    Knee Rehabilitation, Guidelines Following Surgery  Results after knee surgery are often greatly improved when you follow the exercise, range of motion and muscle strengthening exercises prescribed by your doctor. Safety measures are also important to protect the knee from further injury. If any of these exercises cause you to have increased pain or swelling in your knee joint, decrease the amount until you are comfortable again and slowly increase them. If you have problems or questions, call your caregiver or physical therapist for advice.   BLOOD CLOT PREVENTION . Take a 325 mg Aspirin two times a day for three weeks following surgery. Then take an 81 mg Aspirin once a day for three weeks. Then discontinue Aspirin. . You may resume your vitamins/supplements upon discharge from the hospital. . Do not take any NSAIDs (Advil, Aleve, Ibuprofen, Meloxicam, etc.) until you have discontinued the 325 mg Aspirin.  HOME CARE INSTRUCTIONS  . Remove items at home which could result in a fall. This includes throw rugs or furniture in walking pathways.  . ICE to the affected knee as much as tolerated. Icing helps control swelling. If the swelling is well controlled you will be more comfortable and rehab easier. Continue to use ice on the knee for pain and swelling from surgery. You may notice swelling that will progress down to the foot and ankle. This is normal after surgery. Elevate the leg when you are not up walking on it.    . Continue to use the breathing machine which will help keep your temperature down. It is common for your temperature to cycle up and down following surgery, especially at night when you are not up moving around and exerting yourself. The breathing machine keeps your lungs expanded and your  temperature down. . Do not place pillow under the operative knee, focus on keeping the knee straight while resting  DIET You may resume your previous home diet once you are discharged from the hospital.  DRESSING / WOUND CARE / SHOWERING . Keep your bulky bandage on for 2 days. On the third post-operative day you may remove the Ace bandage and gauze. There is a waterproof adhesive bandage on your skin which will stay in place until your first follow-up appointment. Once you remove this you will not need to place another bandage . You may begin showering 3 days following surgery, but do not submerge the incision under water.  ACTIVITY For the first 5 days, the key is rest and control of pain and swelling . Do your home exercises twice a day starting on post-operative day 3. On the days you go to physical therapy, just do the home exercises once that day. . You should rest, ice and elevate the leg for 50 minutes out of every hour. Get up and walk/stretch for 10 minutes per hour. After 5 days you can increase your activity slowly as tolerated. . Walk with your walker as instructed. Use the walker until you are comfortable transitioning to a cane. Walk with the cane in the opposite hand of the operative leg. You may discontinue the cane once you are comfortable and walking steadily. . Avoid periods of inactivity such as sitting longer than an hour when not asleep. This helps prevent blood clots.  . You may discontinue the knee immobilizer once you are able to perform a straight   leg raise while lying down. . You may resume a sexual relationship in one month or when given the OK by your doctor.  . You may return to work once you are cleared by your doctor.  . Do not drive a car for 6 weeks or until released by your surgeon.  . Do not drive while taking narcotics.  TED HOSE STOCKINGS Wear the elastic stockings on both legs for three weeks following surgery during the day. You may remove them at night  for sleeping.  WEIGHT BEARING Weight bearing as tolerated with assist device (walker, cane, etc) as directed, use it as long as suggested by your surgeon or therapist, typically at least 4-6 weeks.  POSTOPERATIVE CONSTIPATION PROTOCOL Constipation - defined medically as fewer than three stools per week and severe constipation as less than one stool per week.  One of the most common issues patients have following surgery is constipation.  Even if you have a regular bowel pattern at home, your normal regimen is likely to be disrupted due to multiple reasons following surgery.  Combination of anesthesia, postoperative narcotics, change in appetite and fluid intake all can affect your bowels.  In order to avoid complications following surgery, here are some recommendations in order to help you during your recovery period.  . Colace (docusate) - Pick up an over-the-counter form of Colace or another stool softener and take twice a day as long as you are requiring postoperative pain medications.  Take with a full glass of water daily.  If you experience loose stools or diarrhea, hold the colace until you stool forms back up. If your symptoms do not get better within 1 week or if they get worse, check with your doctor. . Dulcolax (bisacodyl) - Pick up over-the-counter and take as directed by the product packaging as needed to assist with the movement of your bowels.  Take with a full glass of water.  Use this product as needed if not relieved by Colace only.  . MiraLax (polyethylene glycol) - Pick up over-the-counter to have on hand. MiraLax is a solution that will increase the amount of water in your bowels to assist with bowel movements.  Take as directed and can mix with a glass of water, juice, soda, coffee, or tea. Take if you go more than two days without a movement. Do not use MiraLax more than once per day. Call your doctor if you are still constipated or irregular after using this medication for 7 days  in a row.  If you continue to have problems with postoperative constipation, please contact the office for further assistance and recommendations.  If you experience "the worst abdominal pain ever" or develop nausea or vomiting, please contact the office immediatly for further recommendations for treatment.  ITCHING If you experience itching with your medications, try taking only a single pain pill, or even half a pain pill at a time.  You can also use Benadryl over the counter for itching or also to help with sleep.   MEDICATIONS See your medication summary on the "After Visit Summary" that the nursing staff will review with you prior to discharge.  You may have some home medications which will be placed on hold until you complete the course of blood thinner medication.  It is important for you to complete the blood thinner medication as prescribed by your surgeon.  Continue your approved medications as instructed at time of discharge.  PRECAUTIONS . If you experience chest pain or shortness of   breath - call 911 immediately for transfer to the hospital emergency department.  . If you develop a fever greater that 101 F, purulent drainage from wound, increased redness or drainage from wound, foul odor from the wound/dressing, or calf pain - CONTACT YOUR SURGEON.                                                   FOLLOW-UP APPOINTMENTS Make sure you keep all of your appointments after your operation with your surgeon and caregivers. You should call the office at the above phone number and make an appointment for approximately two weeks after the date of your surgery or on the date instructed by your surgeon outlined in the "After Visit Summary".  RANGE OF MOTION AND STRENGTHENING EXERCISES  Rehabilitation of the knee is important following a knee injury or an operation. After just a few days of immobilization, the muscles of the thigh which control the knee become weakened and shrink (atrophy). Knee  exercises are designed to build up the tone and strength of the thigh muscles and to improve knee motion. Often times heat used for twenty to thirty minutes before working out will loosen up your tissues and help with improving the range of motion but do not use heat for the first two weeks following surgery. These exercises can be done on a training (exercise) mat, on the floor, on a table or on a bed. Use what ever works the best and is most comfortable for you Knee exercises include:  . Leg Lifts - While your knee is still immobilized in a splint or cast, you can do straight leg raises. Lift the leg to 60 degrees, hold for 3 sec, and slowly lower the leg. Repeat 10-20 times 2-3 times daily. Perform this exercise against resistance later as your knee gets better.  . Quad and Hamstring Sets - Tighten up the muscle on the front of the thigh (Quad) and hold for 5-10 sec. Repeat this 10-20 times hourly. Hamstring sets are done by pushing the foot backward against an object and holding for 5-10 sec. Repeat as with quad sets.   Leg Slides: Lying on your back, slowly slide your foot toward your buttocks, bending your knee up off the floor (only go as far as is comfortable). Then slowly slide your foot back down until your leg is flat on the floor again.  Angel Wings: Lying on your back spread your legs to the side as far apart as you can without causing discomfort.  A rehabilitation program following serious knee injuries can speed recovery and prevent re-injury in the future due to weakened muscles. Contact your doctor or a physical therapist for more information on knee rehabilitation.   IF YOU ARE TRANSFERRED TO A SKILLED REHAB FACILITY If the patient is transferred to a skilled rehab facility following release from the hospital, a list of the current medications will be sent to the facility for the patient to continue.  When discharged from the skilled rehab facility, please have the facility set up the  patient's Home Health Physical Therapy prior to being released. Also, the skilled facility will be responsible for providing the patient with their medications at time of release from the facility to include their pain medication, the muscle relaxants, and their blood thinner medication. If the patient is still at the   rehab facility at time of the two week follow up appointment, the skilled rehab facility will also need to assist the patient in arranging follow up appointment in our office and any transportation needs.  MAKE SURE YOU:  . Understand these instructions.  . Get help right away if you are not doing well or get worse.   DENTAL ANTIBIOTICS:  In most cases prophylactic antibiotics for Dental procdeures after total joint surgery are not necessary.  Exceptions are as follows:  1. History of prior total joint infection  2. Severely immunocompromised (Organ Transplant, cancer chemotherapy, Rheumatoid biologic meds such as Humera)  3. Poorly controlled diabetes (A1C &gt; 8.0, blood glucose over 200)  If you have one of these conditions, contact your surgeon for an antibiotic prescription, prior to your dental procedure.    Pick up stool softner and laxative for home use following surgery while on pain medications. Do not submerge incision under water. Please use good hand washing techniques while changing dressing each day. May shower starting three days after surgery. Please use a clean towel to pat the incision dry following showers. Continue to use ice for pain and swelling after surgery. Do not use any lotions or creams on the incision until instructed by your surgeon.  

## 2019-10-07 ENCOUNTER — Encounter: Payer: Self-pay | Admitting: *Deleted

## 2019-10-07 LAB — BASIC METABOLIC PANEL
Anion gap: 10 (ref 5–15)
BUN: 12 mg/dL (ref 8–23)
CO2: 26 mmol/L (ref 22–32)
Calcium: 8.1 mg/dL — ABNORMAL LOW (ref 8.9–10.3)
Chloride: 100 mmol/L (ref 98–111)
Creatinine, Ser: 0.65 mg/dL (ref 0.44–1.00)
GFR calc Af Amer: >60 mL/min (ref >60)
GFR calc non Af Amer: >60 mL/min (ref >60)
Glucose, Bld: 129 mg/dL — ABNORMAL HIGH (ref 70–99)
Potassium: 3.5 mmol/L (ref 3.5–5.1)
Sodium: 136 mmol/L (ref 135–145)

## 2019-10-07 LAB — CBC
HCT: 29.6 % — ABNORMAL LOW (ref 36.0–46.0)
Hemoglobin: 9.3 g/dL — ABNORMAL LOW (ref 12.0–15.0)
MCH: 30.8 pg (ref 26.0–34.0)
MCHC: 31.4 g/dL (ref 30.0–36.0)
MCV: 98 fL (ref 80.0–100.0)
Platelets: 182 10*3/uL (ref 150–400)
RBC: 3.02 MIL/uL — ABNORMAL LOW (ref 3.87–5.11)
RDW: 14.1 % (ref 11.5–15.5)
WBC: 13.3 10*3/uL — ABNORMAL HIGH (ref 4.0–10.5)
nRBC: 0 % (ref 0.0–0.2)

## 2019-10-07 MED ORDER — ESOMEPRAZOLE MAGNESIUM 20 MG PO CPDR
20.0000 mg | DELAYED_RELEASE_CAPSULE | Freq: Every day | ORAL | Status: DC
  Administered 2019-10-08 – 2019-10-09 (×2): 20 mg via ORAL
  Filled 2019-10-07 (×4): qty 1

## 2019-10-07 MED ORDER — PANTOPRAZOLE SODIUM 40 MG PO TBEC: 40.0000 mg | DELAYED_RELEASE_TABLET | Freq: Every day | ORAL | Status: DC

## 2019-10-07 MED ORDER — ESOMEPRAZOLE MAGNESIUM 20 MG PO CPDR
20.0000 mg | DELAYED_RELEASE_CAPSULE | Freq: Every day | ORAL | Status: DC
  Filled 2019-10-07: qty 1

## 2019-10-07 NOTE — TOC Initial Note (Signed)
Transition of Care [facility]) - Initial/Assessment Note    Patient Details  Name: Kerri [name] MRN: [id number] Date of Birth: 1929-09-28  Transition of Care (TOC) CM/SW Contact:    Joaquin Courts, RN Phone Number: 10/07/2019, 10:28 AM  Clinical Narrative:                 CM spoke with patient at bedside.  Patient reports she has rolling walker at home and declines 3in1.  [facility] to provide HHPT and aide.  Expected Discharge Plan: [place] Barriers to Discharge: Continued Medical Work up   Patient Goals and CMS Choice Patient states their goals for this hospitalization and ongoing recovery are:: to go home CMS Medicare.gov Compare Post Acute Care list provided to:: Patient Choice offered to / list presented to : Patient  Expected Discharge Plan and Services Expected Discharge Plan: [place]   Discharge Planning Services: CM Consult Post Acute Care Choice: [place] arrangements for the past 2 months: Single Family Home                 DME Arranged: N/A DME Agency: NA       HH Arranged: PT, Nurse's Aide [place] Agency: [facility] [employer] [facility] [employer] Date [place]: 10/07/19 Time [place]: 1028 Representative spoke with at [place]: [place] Arrangements/Services Living arrangements for the past 2 months: [place] Lives with:: Self Patient language and need for interpreter reviewed:: Yes Do you feel safe going back to the place where you live?: Yes      Need for Family Participation in Patient Care: Yes (Comment) Care giver support system in place?: Yes (comment)   Criminal Activity/Legal Involvement Pertinent to Current Situation/Hospitalization: No - Comment as needed  Activities of Daily Living Home Assistive Devices/Equipment: Cane (specify quad or straight), Dentures (specify type), Walker (specify type) ADL Screening (condition at time of admission) Patient's  cognitive ability adequate to safely complete daily activities?: Yes Is the patient deaf or have difficulty hearing?: No Does the patient have difficulty seeing, even when wearing glasses/contacts?: No Does the patient have difficulty concentrating, remembering, or making decisions?: No Patient able to express need for assistance with ADLs?: Yes Does the patient have difficulty dressing or bathing?: No Independently performs ADLs?: Yes (appropriate for developmental age) Does the patient have difficulty walking or climbing stairs?: No Weakness of Legs: Right Weakness of Arms/Hands: None  Permission Sought/Granted                  Emotional Assessment Appearance:: Appears stated age Attitude/Demeanor/Rapport: Engaged Affect (typically observed): Accepting Orientation: : Oriented to Self, Oriented to Place, Oriented to  Time, Oriented to Situation   Psych Involvement: No (comment)  Admission diagnosis:  Primary osteoarthritis of right knee [M17.11] Patient Active Problem List   Diagnosis Date Noted  . Primary osteoarthritis of right knee 10/06/2019  . Palpitations 03/29/2019  . Failed total hip arthroplasty ([place]) 11/27/2018  . Melanoma ([place])   . Dizziness 01/30/2017  . Lumbar stenosis with neurogenic claudication 11/13/2013  . Sebaceous cyst 11/20/2012  . Squamous cell carcinoma in situ of skin of calf 04/03/2012  . Postop Hypokalemia 09/08/2011  . Postop Acute blood loss anemia 09/05/2011  . Osteoarthritis of hip 09/04/2011  . Melanoma of lower leg ([place]) 05/03/2011  . PVC's (premature ventricular contractions) 07/30/2009  . HYPOTHYROIDISM 05/10/2009  . DYSLIPIDEMIA 05/10/2009  . Essential hypertension 05/10/2009  . CAD  05/10/2009  . OA (osteoarthritis) of knee 05/10/2009  . SPINAL STENOSIS 05/10/2009  . CAROTID BRUIT 05/10/2009   PCP:  Lavone Orn, MD Pharmacy:   [facility], [address] [place] [address] Phone: [phone] Fax: [phone]     Social Determinants of Health (SDOH) Interventions    Readmission Risk Interventions No flowsheet data found.

## 2019-10-07 NOTE — Progress Notes (Signed)
Physical Therapy Treatment Patient Details Name: Kerri [name] MRN: [id number] DOB: Dec 20, 1929 Today's Date: 10/07/2019    History of Present Illness 86 yo female s/p R TKA 10/06/19. hx of chronic pain, osteoporosis, spinal stenosis, L THA 2013, L TH rev 2020, lumbar sg 2015    PT Comments    Progressing with mobility.    Follow Up Recommendations  Follow surgeon's recommendation for DC plan and follow-up therapies     Equipment Recommendations  None recommended by PT    Recommendations for Other Services       Precautions / Restrictions Precautions Precautions: Fall;Knee Restrictions Weight Bearing Restrictions: No Other Position/Activity Restrictions: WBAT    Mobility  Bed Mobility Overal bed mobility: Needs Assistance Bed Mobility: Supine to Sit;Sit to Supine     Supine to sit: Min assist;HOB elevated Sit to supine: Min assist;HOB elevated   General bed mobility comments: Assist for R LE.  Transfers Overall transfer level: Needs assistance Equipment used: Rolling walker (2 wheeled) Transfers: Sit to/from Stand Sit to Stand: Min assist         General transfer comment: Assist to rise, stabilize, control descent. VCs safety, technique, hand/LE placement.  Ambulation/Gait Ambulation/Gait assistance: Min assist Gait Distance (Feet): 75 Feet Assistive device: Rolling walker (2 wheeled) Gait Pattern/deviations: Step-to pattern     General Gait Details: Assist to steady. Cues for safety, posture, distance from RW.   Stairs             Wheelchair Mobility    Modified Rankin (Stroke Patients Only)       Balance Overall balance assessment: Needs assistance         Standing balance support: Bilateral upper extremity supported Standing balance-Leahy Scale: Poor                              Cognition Arousal/Alertness: Awake/alert Behavior During Therapy: WFL for tasks assessed/performed Overall Cognitive Status: Within Functional  Limits for tasks assessed                                        Exercises Total Joint Exercises Ankle Circles/Pumps: AROM;Both;10 reps;Supine Quad Sets: AROM;Both;10 reps;Supine Heel Slides: AAROM;Right;10 reps Hip ABduction/ADduction: AAROM;Right;10 reps Straight Leg Raises: AAROM;Right;10 reps Goniometric ROM: ~10-60 degrees    General Comments        Pertinent Vitals/Pain Pain Assessment: 0-10 Pain Score: 5  Pain Location: R knee Pain Descriptors / Indicators: Sore;Aching Pain Intervention(s): Monitored during session;Ice applied;Repositioned    Home Living                      Prior Function            PT Goals (current goals can now be found in the care plan section) Progress towards PT goals: Progressing toward goals    Frequency    7X/week      PT Plan Current plan remains appropriate    Co-evaluation              AM-PAC PT "6 Clicks" Mobility   Outcome Measure  Help needed turning from your back to your side while in a flat bed without using bedrails?: A Little Help needed moving from lying on your back to sitting on the side of a flat bed without using bedrails?: A Little Help needed moving to and from  a bed to a chair (including a wheelchair)?: A Little Help needed standing up from a chair using your arms (e.g., wheelchair or bedside chair)?: A Little Help needed to walk in hospital room?: A Little Help needed climbing 3-5 steps with a railing? : A Little 6 Click Score: 18    End of Session Equipment Utilized During Treatment: Gait belt Activity Tolerance: Patient tolerated treatment well Patient left: in bed;with call bell/phone within reach;with bed alarm set   PT Visit Diagnosis: Other abnormalities of gait and mobility (R26.89);Pain Pain - Right/Left: Right Pain - part of body: Knee     Time: [id number] PT Time Calculation (min) (ACUTE ONLY): 30 min  Charges:  $Gait Training: 8-22 mins $Therapeutic  Exercise: 8-22 mins                        Doreatha Massed, PT Acute Rehabilitation

## 2019-10-07 NOTE — Progress Notes (Signed)
Subjective: 1 Day Post-Op Procedure(s) (LRB): TOTAL KNEE ARTHROPLASTY (Right) Patient reports pain as mild.   Patient seen in rounds with Dr. Wynelle Link. Patient is well, and has had no acute complaints or problems. No issues overnight. Denies chest pain or SOB. Voiding without difficulty. We will continue therapy today.   Objective: Vital signs in last 24 hours: Temp:  [97.7 F (36.5 C)-98.4 F (36.9 C)] 98.4 F (36.9 C) (05/18 0618) Pulse Rate:  [58-79] 79 (05/18 0618) Resp:  [0-20] 16 (05/18 0618) BP: (106-180)/(64-98) 115/68 (05/18 0618) SpO2:  [92 %-100 %] 92 % (05/18 0618) Weight:  [61.2 kg] 61.2 kg (05/17 0837)  Intake/Output from previous day:  Intake/Output Summary (Last 24 hours) at 10/07/2019 0744 Last data filed at 10/07/2019 0600 Gross per 24 hour  Intake 3346.44 ml  Output 400 ml  Net 2946.44 ml     Intake/Output this shift: No intake/output data recorded.  Labs: Recent Labs    10/07/19 0304  HGB 9.3*   Recent Labs    10/07/19 0304  WBC 13.3*  RBC 3.02*  HCT 29.6*  PLT 182   Recent Labs    10/07/19 0304  NA 136  K 3.5  CL 100  CO2 26  BUN 12  CREATININE 0.65  GLUCOSE 129*  CALCIUM 8.1*   No results for input(s): LABPT, INR in the last 72 hours.  Exam: General - Patient is Alert and Oriented Extremity - Neurologically intact Neurovascular intact Sensation intact distally Dorsiflexion/Plantar flexion intact Dressing - dressing C/D/I Motor Function - intact, moving foot and toes well on exam.   Past Medical History:  Diagnosis Date  . Adenomatous colon polyp   . Asthma   . Basal cell carcinoma 03/13/2017   left forehead - CX3 + cautery + 5FU  . CAD (coronary artery disease)    Chest CTA 10/07: 40% or less pLAD;  ETT 8/09: negative  . Carotid bruit   . Chronic back pain   . Depression   . Diverticulosis   . DJD (degenerative joint disease)   . Dyslipidemia   . Dysrhythmia    pvcs  . Fatty liver   . GERD (gastroesophageal  reflux disease)   . HTN (hypertension)   . Hypothyroidism   . IBS (irritable bowel syndrome)   . Internal hemorrhoid   . Melanoma ([place])    metastatic; left leg; s/p multiple excisions  . Migraine   . Osteoporosis   . Spinal stenosis   . Squamous cell carcinoma of skin 09/02/2015   mid chest - CX3 + 5FU  . Squamous cell carcinoma of skin 11/07/2017   left wrist - tx p bx  . Squamous cell carcinoma of skin 12/11/2017   left hand - tx p bx    Assessment/Plan: 1 Day Post-Op Procedure(s) (LRB): TOTAL KNEE ARTHROPLASTY (Right) Principal Problem:   OA (osteoarthritis) of knee Active Problems:   Primary osteoarthritis of right knee  Estimated body mass index is 22.47 kg/m as calculated from the following:   Height as of this encounter: 5\' 5"  (1.651 m).   Weight as of this encounter: 61.2 kg. Advance diet Up with therapy  Anticipated LOS equal to or greater than 2 midnights due to - Age 84 and older with one or more of the following:  - Obesity  - Expected need for hospital services (PT, OT, Nursing) required for safe  discharge  - Anticipated need for postoperative skilled nursing care or inpatient rehab  - Active co-morbidities: None OR   -  Unanticipated findings during/Post Surgery: None  - Patient is a high risk of re-admission due to: None    DVT Prophylaxis - Aspirin Weight bearing as tolerated.  Plan is to go Home after hospital stay. Plan for discharge tomorrow with HHPT pending progress with therapy.  Theresa Duty, PA-C Orthopedic Surgery [phone] 10/07/2019, 7:44 AM

## 2019-10-07 NOTE — Progress Notes (Addendum)
Pt AM medications delayed since pt requested specific food items to be consumed prior to medication administration. Pt does not like the soda, crackers, applesauce, and water available on the unit. Food ordered from service response. Will give AM medications when pt agrees to take medication.  At 1230 RN checked with pt to see if appropriate/acceptable food was at bedside. Pt was upset that she had not received her medications yet and said that the food was sh*t and she wasn't going to eat it anyway. I conveyed that I requested her nucynta (non-floor stock medication)  from pharmacy. Pt is not trusting of RN and verablizes several curse words and disgust with RN. She continues to question whether or not I actually had requested the medication. She also thought that the aspirin was fake and refused to take it. She had a verbal tantrum stating "this hospital is sh*t, when are my pain pills going to be here - tomorrow?". She was watching [employer] and kept saying things like "reverse racism is what is wrong with the world, these are fake pills just like there is fake news, this hospital is just fake, everyone should be put in jail.". She kept asking if I was sure and if had I actually done what I said I had done. I attempted several times to reassure patient that I requested her pain medications and that I was working to meet her needs. She replied sarcastically "yeah right" and "sure sure sure".

## 2019-10-07 NOTE — Progress Notes (Addendum)
Physical Therapy Treatment Patient Details Name: Kerri [name] MRN: [id number] DOB: [birth year] Today's Date: 10/07/2019    History of Present Illness 85 yo female s/p R TKA 10/06/19. hx of chronic pain, osteoporosis, spinal stenosis, L THA 2013, L TH rev 2020, lumbar sg 2015    PT Comments    Progressing with mobility. Pt expressed some complaints about her hospital stay intermittently during session. She also tends to engage in, sometimes inappropriate, politically charged conversation. Will continue to follow and progress activity as tolerated.    Follow Up Recommendations  Follow surgeon's recommendation for DC plan and follow-up therapies     Equipment Recommendations  None recommended by PT    Recommendations for Other Services       Precautions / Restrictions Precautions Precautions: Fall;Knee Restrictions Weight Bearing Restrictions: No Other Position/Activity Restrictions: WBAT    Mobility  Bed Mobility Overal bed mobility: Needs Assistance Bed Mobility: Sit to Supine     Supine to sit: Min assist;HOB elevated Sit to supine: Min assist;HOB elevated   General bed mobility comments: Assist for R LE.  Transfers Overall transfer level: Needs assistance Equipment used: Rolling walker (2 wheeled) Transfers: Sit to/from Stand Sit to Stand: Min assist         General transfer comment: Assist to rise, stabilize, control descent. VCs safety, technique, hand/LE placement.  Ambulation/Gait Ambulation/Gait assistance: Min assist Gait Distance (Feet): 100 Feet Assistive device: Rolling walker (2 wheeled) Gait Pattern/deviations: Step-to pattern;Step-through pattern;Decreased stride length     General Gait Details: Assist to steady. Cues for safety, posture, distance from RW.   Stairs             Wheelchair Mobility    Modified Rankin (Stroke Patients Only)       Balance Overall balance assessment: Needs assistance         Standing balance  support: Bilateral upper extremity supported Standing balance-Leahy Scale: Poor                              Cognition Arousal/Alertness: Awake/alert Behavior During Therapy: WFL for tasks assessed/performed Overall Cognitive Status: Within Functional Limits for tasks assessed                                        Exercises     General Comments        Pertinent Vitals/Pain Pain Assessment: 0-10 Pain Score: 5  Pain Location: R knee Pain Descriptors / Indicators: Sore;Aching Pain Intervention(s): Monitored during session;Ice applied;Repositioned    Home Living                      Prior Function            PT Goals (current goals can now be found in the care plan section) Progress towards PT goals: Progressing toward goals    Frequency    7X/week      PT Plan Current plan remains appropriate    Co-evaluation              AM-PAC PT "6 Clicks" Mobility   Outcome Measure  Help needed turning from your back to your side while in a flat bed without using bedrails?: A Little Help needed moving from lying on your back to sitting on the side of a flat bed without using bedrails?: A Little Help  needed moving to and from a bed to a chair (including a wheelchair)?: A Little Help needed standing up from a chair using your arms (e.g., wheelchair or bedside chair)?: A Little Help needed to walk in hospital room?: A Little Help needed climbing 3-5 steps with a railing? : A Little 6 Click Score: 18    End of Session Equipment Utilized During Treatment: Gait belt Activity Tolerance: Patient tolerated treatment well Patient left: in bed;with call bell/phone within reach;with bed alarm set;with family/visitor present   PT Visit Diagnosis: Other abnormalities of gait and mobility (R26.89);Pain Pain - Right/Left: Right Pain - part of body: Knee     Time: [id number] PT Time Calculation (min) (ACUTE ONLY): 23 min  Charges:   $Gait Training: 23-37 mins $Therapeutic Exercise: 8-22 mins                         Doreatha Massed, PT Acute Rehabilitation

## 2019-10-08 LAB — BASIC METABOLIC PANEL
Anion gap: 10 (ref 5–15)
BUN: 10 mg/dL (ref 8–23)
CO2: 30 mmol/L (ref 22–32)
Calcium: 8.7 mg/dL — ABNORMAL LOW (ref 8.9–10.3)
Chloride: 101 mmol/L (ref 98–111)
Creatinine, Ser: 0.6 mg/dL (ref 0.44–1.00)
GFR calc Af Amer: >60 mL/min (ref >60)
GFR calc non Af Amer: >60 mL/min (ref >60)
Glucose, Bld: 138 mg/dL — ABNORMAL HIGH (ref 70–99)
Potassium: 3.2 mmol/L — ABNORMAL LOW (ref 3.5–5.1)
Sodium: 141 mmol/L (ref 135–145)

## 2019-10-08 LAB — CBC
HCT: 25.7 % — ABNORMAL LOW (ref 36.0–46.0)
Hemoglobin: 8.2 g/dL — ABNORMAL LOW (ref 12.0–15.0)
MCH: 31.4 pg (ref 26.0–34.0)
MCHC: 31.9 g/dL (ref 30.0–36.0)
MCV: 98.5 fL (ref 80.0–100.0)
Platelets: 168 10*3/uL (ref 150–400)
RBC: 2.61 MIL/uL — ABNORMAL LOW (ref 3.87–5.11)
RDW: 14.2 % (ref 11.5–15.5)
WBC: 9.5 10*3/uL (ref 4.0–10.5)
nRBC: 0 % (ref 0.0–0.2)

## 2019-10-08 NOTE — Progress Notes (Signed)
Physical Therapy Treatment Patient Details Name: Kerri [name] MRN: [id number] DOB: 1930/01/05 Today's Date: 10/08/2019    History of Present Illness 86 yo female s/p R TKA 10/06/19. hx of chronic pain, osteoporosis, spinal stenosis, L THA 2013, L TH rev 2020, lumbar sg 2015    PT Comments    Progressing with mobility.    Follow Up Recommendations  Follow surgeon's recommendation for DC plan and follow-up therapies     Equipment Recommendations  None recommended by PT    Recommendations for Other Services       Precautions / Restrictions Precautions Precautions: Fall;Knee Restrictions Weight Bearing Restrictions: No RLE Weight Bearing: Weight bearing as tolerated    Mobility  Bed Mobility               General bed mobility comments: oob in recliner  Transfers Overall transfer level: Needs assistance Equipment used: Rolling walker (2 wheeled) Transfers: Sit to/from Stand Sit to Stand: Min assist         General transfer comment: Assist to rise, stabilize, control descent. VCs safety, technique, hand/LE placement.  Ambulation/Gait Ambulation/Gait assistance: Min assist Gait Distance (Feet): 100 Feet Assistive device: Rolling walker (2 wheeled) Gait Pattern/deviations: Step-to pattern;Step-through pattern;Decreased stride length     General Gait Details: Assist to steady. Cues for safety, posture, distance from RW.   Stairs             Wheelchair Mobility    Modified Rankin (Stroke Patients Only)       Balance Overall balance assessment: Needs assistance         Standing balance support: Bilateral upper extremity supported Standing balance-Leahy Scale: Poor                              Cognition Arousal/Alertness: Awake/alert Behavior During Therapy: WFL for tasks assessed/performed Overall Cognitive Status: Within Functional Limits for tasks assessed                                        Exercises       General Comments        Pertinent Vitals/Pain Pain Assessment: 0-10 Pain Score: 6  Pain Location: R knee Pain Descriptors / Indicators: Sore;Aching Pain Intervention(s): Monitored during session;Repositioned;Ice applied    Home Living                      Prior Function            PT Goals (current goals can now be found in the care plan section) Progress towards PT goals: Progressing toward goals    Frequency    7X/week      PT Plan Current plan remains appropriate    Co-evaluation              AM-PAC PT "6 Clicks" Mobility   Outcome Measure  Help needed turning from your back to your side while in a flat bed without using bedrails?: A Little Help needed moving from lying on your back to sitting on the side of a flat bed without using bedrails?: A Little Help needed moving to and from a bed to a chair (including a wheelchair)?: A Little Help needed standing up from a chair using your arms (e.g., wheelchair or bedside chair)?: A Little Help needed to walk in hospital room?: A Little Help  needed climbing 3-5 steps with a railing? : A Little 6 Click Score: 18    End of Session Equipment Utilized During Treatment: Gait belt Activity Tolerance: Patient tolerated treatment well Patient left: in chair;with call bell/phone within reach;with chair alarm set   PT Visit Diagnosis: Other abnormalities of gait and mobility (R26.89);Pain Pain - Right/Left: Right Pain - part of body: Knee     Time: [id number] PT Time Calculation (min) (ACUTE ONLY): 27 min  Charges:  $Gait Training: 23-37 mins                         Doreatha Massed, PT Acute Rehabilitation

## 2019-10-08 NOTE — Progress Notes (Signed)
Subjective: 2 Days Post-Op Procedure(s) (LRB): TOTAL KNEE ARTHROPLASTY (Right) Patient reports pain as mild.   Patient seen in rounds for Dr. Wynelle Link. Patient is well, and has had no acute complaints or problems. No issues overnight. Denies chest pain, SOB, or calf pain. Voiding without difficulty. Positive flatus.  Plan is to go Home after hospital stay.  Objective: Vital signs in last 24 hours: Temp:  [97.5 F (36.4 C)-98.7 F (37.1 C)] 97.9 F (36.6 C) (05/19 0557) Pulse Rate:  [70-85] 70 (05/19 0557) Resp:  [15-17] 15 (05/19 0557) BP: (99-118)/(51-60) 118/60 (05/19 0557) SpO2:  [94 %-98 %] 94 % (05/19 0557)  Intake/Output from previous day:  Intake/Output Summary (Last 24 hours) at 10/08/2019 0845 Last data filed at 10/08/2019 0600 Gross per 24 hour  Intake 1564.11 ml  Output --  Net 1564.11 ml    Intake/Output this shift: No intake/output data recorded.  Labs: Recent Labs    10/07/19 0304 10/08/19 0251  HGB 9.3* 8.2*   Recent Labs    10/07/19 0304 10/08/19 0251  WBC 13.3* 9.5  RBC 3.02* 2.61*  HCT 29.6* 25.7*  PLT 182 168   Recent Labs    10/07/19 0304 10/08/19 0251  NA 136 141  K 3.5 3.2*  CL 100 101  CO2 26 30  BUN 12 10  CREATININE 0.65 0.60  GLUCOSE 129* 138*  CALCIUM 8.1* 8.7*   No results for input(s): LABPT, INR in the last 72 hours.  Exam: General - Patient is Alert and Oriented Extremity - Neurologically intact Neurovascular intact Sensation intact distally Dorsiflexion/Plantar flexion intact Dressing/Incision - clean, dry, no drainage. Aquacell in place. Motor Function - intact, moving foot and toes well on exam.   Past Medical History:  Diagnosis Date  . Adenomatous colon polyp   . Asthma   . Basal cell carcinoma 03/13/2017   left forehead - CX3 + cautery + 5FU  . CAD (coronary artery disease)    Chest CTA 10/07: 40% or less pLAD;  ETT 8/09: negative  . Carotid bruit   . Chronic back pain   . Depression   .  Diverticulosis   . DJD (degenerative joint disease)   . Dyslipidemia   . Dysrhythmia    pvcs  . Fatty liver   . GERD (gastroesophageal reflux disease)   . HTN (hypertension)   . Hypothyroidism   . IBS (irritable bowel syndrome)   . Internal hemorrhoid   . Melanoma ([place])    metastatic; left leg; s/p multiple excisions  . Migraine   . Osteoporosis   . Spinal stenosis   . Squamous cell carcinoma of skin 09/02/2015   mid chest - CX3 + 5FU  . Squamous cell carcinoma of skin 11/07/2017   left wrist - tx p bx  . Squamous cell carcinoma of skin 12/11/2017   left hand - tx p bx    Assessment/Plan: 2 Days Post-Op Procedure(s) (LRB): TOTAL KNEE ARTHROPLASTY (Right) Principal Problem:   OA (osteoarthritis) of knee Active Problems:   Primary osteoarthritis of right knee  Estimated body mass index is 22.47 kg/m as calculated from the following:   Height as of this encounter: 5\' 5"  (1.651 m).   Weight as of this encounter: 61.2 kg. Up with therapy  DVT Prophylaxis - Aspirin Weight-bearing as tolerated  Patient is progressing well, but would benefit from another day in the hospital to continue working with physical therapy. Given age she is a high risk for falls at home. Not able  to independently ambulate back and forth to the bathroom. Hemoglobin 8.2 this AM. Will recheck with repeat CBC in the AM. Potassium 3.2, takes 20 mEq KCl daily. If not improved tomorrow will boost dose.  Hopeful for discharge tomorrow pending progression with therapy.  Theresa Duty, PA-C Orthopedic Surgery [phone] 10/08/2019, 8:45 AM

## 2019-10-08 NOTE — Progress Notes (Signed)
Physical Therapy Treatment Patient Details Name: Kerri [name] MRN: [id number] DOB: [birth year] Today's Date: 10/08/2019    History of Present Illness 86 yo female s/p R TKA 10/06/19. hx of chronic pain, osteoporosis, spinal stenosis, L THA 2013, L TH rev 2020, lumbar sg 2015    PT Comments    Progressing with mobility.    Follow Up Recommendations  Follow surgeon's recommendation for DC plan and follow-up therapies     Equipment Recommendations  None recommended by PT    Recommendations for Other Services       Precautions / Restrictions Precautions Precautions: Fall;Knee Restrictions Weight Bearing Restrictions: No RLE Weight Bearing: Weight bearing as tolerated    Mobility  Bed Mobility Overal bed mobility: Needs Assistance Bed Mobility: Supine to Sit;Sit to Supine     Supine to sit: Min assist;HOB elevated Sit to supine: Min assist;HOB elevated   General bed mobility comments: Assist for R LE  Transfers Overall transfer level: Needs assistance Equipment used: Rolling walker (2 wheeled) Transfers: Sit to/from Stand Sit to Stand: Min assist         General transfer comment: Assist to rise, stabilize, control descent. VCs safety, technique, hand/LE placement.  Ambulation/Gait Ambulation/Gait assistance: Min assist Gait Distance (Feet): 125 Feet Assistive device: Rolling walker (2 wheeled) Gait Pattern/deviations: Step-to pattern;Step-through pattern;Decreased stride length     General Gait Details: Assist to steady. Cues for safety, posture, distance from RW.   Stairs             Wheelchair Mobility    Modified Rankin (Stroke Patients Only)       Balance Overall balance assessment: Needs assistance         Standing balance support: Bilateral upper extremity supported Standing balance-Leahy Scale: Poor                              Cognition Arousal/Alertness: Awake/alert Behavior During Therapy: WFL for tasks  assessed/performed Overall Cognitive Status: Within Functional Limits for tasks assessed                                        Exercises Total Joint Exercises Quad Sets: AROM;Right;10 reps Heel Slides: AAROM;Right;10 reps Straight Leg Raises: AAROM;Right;10 reps    General Comments        Pertinent Vitals/Pain Pain Assessment: 0-10 Pain Score: 6  Pain Location: R knee Pain Descriptors / Indicators: Sore;Aching Pain Intervention(s): Monitored during session    Home Living                      Prior Function            PT Goals (current goals can now be found in the care plan section) Progress towards PT goals: Progressing toward goals    Frequency    7X/week      PT Plan Current plan remains appropriate    Co-evaluation              AM-PAC PT "6 Clicks" Mobility   Outcome Measure  Help needed turning from your back to your side while in a flat bed without using bedrails?: A Little Help needed moving from lying on your back to sitting on the side of a flat bed without using bedrails?: A Little Help needed moving to and from a bed to a chair (including a  wheelchair)?: A Little Help needed standing up from a chair using your arms (e.g., wheelchair or bedside chair)?: A Little Help needed to walk in hospital room?: A Little Help needed climbing 3-5 steps with a railing? : A Little 6 Click Score: 18    End of Session Equipment Utilized During Treatment: Gait belt Activity Tolerance: Patient tolerated treatment well Patient left: in bed;with call bell/phone within reach;with bed alarm set   PT Visit Diagnosis: Other abnormalities of gait and mobility (R26.89);Pain Pain - Right/Left: Right Pain - part of body: Knee     Time: [id number] PT Time Calculation (min) (ACUTE ONLY): 29 min  Charges:  $Gait Training: 8-22 mins $Therapeutic Exercise: 8-22 mins                         Doreatha Massed, PT Acute Rehabilitation

## 2019-10-09 LAB — CBC
HCT: 25.3 % — ABNORMAL LOW (ref 36.0–46.0)
Hemoglobin: 7.9 g/dL — ABNORMAL LOW (ref 12.0–15.0)
MCH: 30.7 pg (ref 26.0–34.0)
MCHC: 31.2 g/dL (ref 30.0–36.0)
MCV: 98.4 fL (ref 80.0–100.0)
Platelets: 170 10*3/uL (ref 150–400)
RBC: 2.57 MIL/uL — ABNORMAL LOW (ref 3.87–5.11)
RDW: 14.3 % (ref 11.5–15.5)
WBC: 7.9 10*3/uL (ref 4.0–10.5)
nRBC: 0 % (ref 0.0–0.2)

## 2019-10-09 MED ORDER — POTASSIUM CHLORIDE CRYS ER 20 MEQ PO TBCR
40.0000 meq | EXTENDED_RELEASE_TABLET | Freq: Two times a day (BID) | ORAL | Status: DC
  Administered 2019-10-09: 40 meq via ORAL
  Filled 2019-10-09: qty 2

## 2019-10-09 MED ORDER — TAPENTADOL HCL 50 MG PO TABS: 50.0000 mg | ORAL_TABLET | Freq: Four times a day (QID) | ORAL | 0 refills | Status: DC | PRN

## 2019-10-09 MED ORDER — METHOCARBAMOL 500 MG PO TABS: 500.0000 mg | ORAL_TABLET | Freq: Four times a day (QID) | ORAL | 0 refills | Status: DC | PRN

## 2019-10-09 MED ORDER — ASPIRIN 325 MG PO TBEC: 325.0000 mg | DELAYED_RELEASE_TABLET | Freq: Two times a day (BID) | ORAL | 0 refills | Status: AC

## 2019-10-09 MED ORDER — FERROUS SULFATE 325 (65 FE) MG PO TBEC
325.0000 mg | DELAYED_RELEASE_TABLET | Freq: Two times a day (BID) | ORAL | 0 refills | Status: DC
Start: 2019-10-09 — End: 2020-04-08

## 2019-10-09 NOTE — Plan of Care (Signed)
All discharge instructions were given to Pt and her daughter [name]. All questions were answered.

## 2019-10-09 NOTE — Progress Notes (Signed)
Subjective: 3 Days Post-Op Procedure(s) (LRB): TOTAL KNEE ARTHROPLASTY (Right) Patient reports pain as mild.   Patient seen in rounds by Dr. Wynelle Link. Patient is well, and has had no acute complaints or problems other than pain in the right knee. Denies chest pain or SOB. Progressing with physical therapy. No issues overnight. Voiding without difficulty, positive flatus.  Plan is to go Home after hospital stay.  Objective: Vital signs in last 24 hours: Temp:  [98.7 F (37.1 C)-99.3 F (37.4 C)] 98.9 F (37.2 C) (05/20 0340) Pulse Rate:  [77-95] 77 (05/20 0340) Resp:  [18-20] 20 (05/20 0340) BP: (120-135)/(60-68) 135/68 (05/20 0340) SpO2:  [90 %-99 %] 90 % (05/20 0340)  Intake/Output from previous day:  Intake/Output Summary (Last 24 hours) at 10/09/2019 0752 Last data filed at 10/09/2019 0600 Gross per 24 hour  Intake 1320 ml  Output 0 ml  Net 1320 ml    Intake/Output this shift: No intake/output data recorded.  Labs: Recent Labs    10/07/19 0304 10/08/19 0251 10/09/19 0414  HGB 9.3* 8.2* 7.9*   Recent Labs    10/08/19 0251 10/09/19 0414  WBC 9.5 7.9  RBC 2.61* 2.57*  HCT 25.7* 25.3*  PLT 168 170   Recent Labs    10/07/19 0304 10/08/19 0251  NA 136 141  K 3.5 3.2*  CL 100 101  CO2 26 30  BUN 12 10  CREATININE 0.65 0.60  GLUCOSE 129* 138*  CALCIUM 8.1* 8.7*   No results for input(s): LABPT, INR in the last 72 hours.  Exam: General - Patient is Alert and Oriented Extremity - Neurologically intact Neurovascular intact Sensation intact distally Dorsiflexion/Plantar flexion intact Dressing/Incision - clean, dry, no drainage Motor Function - intact, moving foot and toes well on exam.   Past Medical History:  Diagnosis Date  . Adenomatous colon polyp   . Asthma   . Basal cell carcinoma 03/13/2017   left forehead - CX3 + cautery + 5FU  . CAD (coronary artery disease)    Chest CTA 10/07: 40% or less pLAD;  ETT 8/09: negative  . Carotid bruit     . Chronic back pain   . Depression   . Diverticulosis   . DJD (degenerative joint disease)   . Dyslipidemia   . Dysrhythmia    pvcs  . Fatty liver   . GERD (gastroesophageal reflux disease)   . HTN (hypertension)   . Hypothyroidism   . IBS (irritable bowel syndrome)   . Internal hemorrhoid   . Melanoma ([place])    metastatic; left leg; s/p multiple excisions  . Migraine   . Osteoporosis   . Spinal stenosis   . Squamous cell carcinoma of skin 09/02/2015   mid chest - CX3 + 5FU  . Squamous cell carcinoma of skin 11/07/2017   left wrist - tx p bx  . Squamous cell carcinoma of skin 12/11/2017   left hand - tx p bx    Assessment/Plan: 3 Days Post-Op Procedure(s) (LRB): TOTAL KNEE ARTHROPLASTY (Right) Principal Problem:   OA (osteoarthritis) of knee Active Problems:   Primary osteoarthritis of right knee  Estimated body mass index is 22.47 kg/m as calculated from the following:   Height as of this encounter: 5\' 5"  (1.651 m).   Weight as of this encounter: 61.2 kg. Up with therapy  DVT Prophylaxis - Aspirin Weight-bearing as tolerated  Potassium 3.2, two doses of 40 mEq ordered to give in addition to usual potassium supplement.  Hemoglobin 7.9, will send  home with two weeks worth of iron. HHPT scheduled. Possible discharge later today after two sessions of therapy if meeting goals. Follow-up in the office on June 1st.  Theresa Duty, [facility] [phone] 10/09/2019, 7:52 AM

## 2019-10-09 NOTE — Progress Notes (Signed)
Physical Therapy Treatment Patient Details Name: Kerri [name] MRN: [id number] DOB: [birth year] Today's Date: 10/09/2019    History of Present Illness 86 yo female s/p R TKA 10/06/19. hx of chronic pain, osteoporosis, spinal stenosis, L THA 2013, L TH rev 2020, lumbar sg 2015    PT Comments    POD # 3 General Comments: AxO x 4 slightly groggy (meds) but functional.  General bed mobility comments: pt was self able with increased time to transfer from supine to EOB.  General transfer comment: pt was self able to rise OOB and also safely perform a toilet transfer.  Pt self able to permform peri care in standing.  Only required increased time.General Gait Details: pt amb from bed to bathroom, around the bathroom to room, then in hallway all at Supervision level.  Slow but steady.General stair comments: <25% VC's on proper sequencing and increased time.  Pt safely perfomred 2 steps using B rails (same as her garage) Returned to room and positioned in recliner.  Pt called daughter for a 2 pm D/C.   Follow Up Recommendations  Follow surgeon's recommendation for DC plan and follow-up therapies;Home health PT     Equipment Recommendations  None recommended by PT    Recommendations for Other Services       Precautions / Restrictions Precautions Precautions: Fall;Knee Precaution Comments: reviewed no pillow left under knee Restrictions Weight Bearing Restrictions: No RLE Weight Bearing: Weight bearing as tolerated    Mobility  Bed Mobility Overal bed mobility: Needs Assistance Bed Mobility: Supine to Sit     Supine to sit: Supervision;Min guard     General bed mobility comments: pt was self able with increased time to transfer from supine to EOB  Transfers Overall transfer level: Needs assistance Equipment used: Rolling walker (2 wheeled) Transfers: Sit to/from Stand Sit to Stand: Supervision;Min guard         General transfer comment: pt was self able to rise OOB and also safely  perform a toilet transfer.  Pt self able to permform peri care in standing.  Only required increased time.  Ambulation/Gait Ambulation/Gait assistance: Supervision;Min guard Gait Distance (Feet): 55 Feet Assistive device: Rolling walker (2 wheeled) Gait Pattern/deviations: Step-to pattern;Step-through pattern;Decreased stride length Gait velocity: decreased x 2   General Gait Details: pt amb from bed to bathroom, around the bathroom to room, then in hallway all at Supervision level.  Slow but steady.   Stairs Stairs: Yes Stairs assistance: Supervision;Min guard Stair Management: Step to pattern;Forwards;Two rails Number of Stairs: 2 General stair comments: <25% VC's on proper sequencing and increased time.  Pt safely perfomred 2 steps using B rails (same as her garage)   Wheelchair Mobility    Modified Rankin (Stroke Patients Only)       Balance                                            Cognition Arousal/Alertness: Awake/alert Behavior During Therapy: WFL for tasks assessed/performed Overall Cognitive Status: Within Functional Limits for tasks assessed                                 General Comments: AxO x 4 slightly groggy (meds) but functional      Exercises      General Comments  Pertinent Vitals/Pain Pain Assessment: 0-10 Pain Score: 7  Pain Location: R knee Pain Descriptors / Indicators: Sore;Aching;Operative site guarding Pain Intervention(s): Monitored during session;Premedicated before session;Repositioned;Ice applied    Home Living                      Prior Function            PT Goals (current goals can now be found in the care plan section) Progress towards PT goals: Progressing toward goals    Frequency    7X/week      PT Plan Current plan remains appropriate    Co-evaluation              AM-PAC PT "6 Clicks" Mobility   Outcome Measure  Help needed turning from your back  to your side while in a flat bed without using bedrails?: None Help needed moving from lying on your back to sitting on the side of a flat bed without using bedrails?: None Help needed moving to and from a bed to a chair (including a wheelchair)?: None Help needed standing up from a chair using your arms (e.g., wheelchair or bedside chair)?: None Help needed to walk in hospital room?: None Help needed climbing 3-5 steps with a railing? : A Little 6 Click Score: 23    End of Session Equipment Utilized During Treatment: Gait belt Activity Tolerance: Patient tolerated treatment well Patient left: in chair;with call bell/phone within reach;with chair alarm set Nurse Communication: Mobility status(pt has met goals for D/C today with family support) PT Visit Diagnosis: Other abnormalities of gait and mobility (R26.89);Pain Pain - Right/Left: Right Pain - part of body: Knee     Time: 9611-6435 PT Time Calculation (min) (ACUTE ONLY): 44 min  Charges:  $Gait Training: 8-22 mins $Therapeutic Activity: 23-37 mins                     Rica Koyanagi  PTA Acute  Rehabilitation Services Pager      [phone] Office      [phone]

## 2019-10-09 NOTE — Plan of Care (Signed)
  Problem: Health Behavior/Discharge Planning: Goal: Ability to manage health-related needs will improve Outcome: Progressing   Problem: Clinical Measurements: Goal: Ability to maintain clinical measurements within normal limits will improve Outcome: Progressing   Problem: Clinical Measurements: Goal: Will remain free from infection Outcome: Progressing   Problem: Clinical Measurements: Goal: Diagnostic test results will improve Outcome: Progressing   Problem: Clinical Measurements: Goal: Respiratory complications will improve Outcome: Progressing   Problem: Clinical Measurements: Goal: Cardiovascular complication will be avoided Outcome: Progressing   Problem: Activity: Goal: Risk for activity intolerance will decrease Outcome: Progressing   Problem: Nutrition: Goal: Adequate nutrition will be maintained Outcome: Progressing

## 2019-10-09 NOTE — Care Management Important Message (Signed)
Important Message  Patient Details IM Letter given to [facility] RN Case Manager to present to the Patient Name: Kerri [name] MRN: [id number] Date of Birth: December 09, 1929   Medicare Important Message Given:  Yes     [name] 10/09/2019, 11:06 AM

## 2019-10-10 LAB — TYPE AND SCREEN
ABO/RH(D): A POS
Antibody Screen: NEGATIVE
Unit division: 0
Unit division: 0

## 2019-10-10 LAB — BPAM RBC
Blood Product Expiration Date: 202106042359
Blood Product Expiration Date: 202106052359
Unit Type and Rh: 6200
Unit Type and Rh: 6200

## 2019-10-11 DIAGNOSIS — L723 Sebaceous cyst: Secondary | ICD-10-CM | POA: Diagnosis not present

## 2019-10-11 DIAGNOSIS — F329 Major depressive disorder, single episode, unspecified: Secondary | ICD-10-CM | POA: Diagnosis not present

## 2019-10-11 DIAGNOSIS — J45909 Unspecified asthma, uncomplicated: Secondary | ICD-10-CM | POA: Diagnosis not present

## 2019-10-11 DIAGNOSIS — K219 Gastro-esophageal reflux disease without esophagitis: Secondary | ICD-10-CM | POA: Diagnosis not present

## 2019-10-11 DIAGNOSIS — I10 Essential (primary) hypertension: Secondary | ICD-10-CM | POA: Diagnosis not present

## 2019-10-11 DIAGNOSIS — M810 Age-related osteoporosis without current pathological fracture: Secondary | ICD-10-CM | POA: Diagnosis not present

## 2019-10-11 DIAGNOSIS — K5790 Diverticulosis of intestine, part unspecified, without perforation or abscess without bleeding: Secondary | ICD-10-CM | POA: Diagnosis not present

## 2019-10-11 DIAGNOSIS — K760 Fatty (change of) liver, not elsewhere classified: Secondary | ICD-10-CM | POA: Diagnosis not present

## 2019-10-11 DIAGNOSIS — E785 Hyperlipidemia, unspecified: Secondary | ICD-10-CM | POA: Diagnosis not present

## 2019-10-11 DIAGNOSIS — E039 Hypothyroidism, unspecified: Secondary | ICD-10-CM | POA: Diagnosis not present

## 2019-10-11 DIAGNOSIS — M48062 Spinal stenosis, lumbar region with neurogenic claudication: Secondary | ICD-10-CM | POA: Diagnosis not present

## 2019-10-11 DIAGNOSIS — K648 Other hemorrhoids: Secondary | ICD-10-CM | POA: Diagnosis not present

## 2019-10-11 DIAGNOSIS — K589 Irritable bowel syndrome without diarrhea: Secondary | ICD-10-CM | POA: Diagnosis not present

## 2019-10-11 DIAGNOSIS — I2510 Atherosclerotic heart disease of native coronary artery without angina pectoris: Secondary | ICD-10-CM | POA: Diagnosis not present

## 2019-10-11 DIAGNOSIS — G43909 Migraine, unspecified, not intractable, without status migrainosus: Secondary | ICD-10-CM | POA: Diagnosis not present

## 2019-10-11 DIAGNOSIS — Z471 Aftercare following joint replacement surgery: Secondary | ICD-10-CM | POA: Diagnosis not present

## 2019-10-13 ENCOUNTER — Emergency Department (HOSPITAL_COMMUNITY): Payer: Medicare Other

## 2019-10-13 ENCOUNTER — Encounter (HOSPITAL_COMMUNITY): Payer: Self-pay | Admitting: Emergency Medicine

## 2019-10-13 ENCOUNTER — Other Ambulatory Visit: Payer: Self-pay

## 2019-10-13 ENCOUNTER — Emergency Department (HOSPITAL_COMMUNITY)
Admission: EM | Admit: 2019-10-13 | Discharge: 2019-10-14 | Disposition: A | Discharge status: Home / Self Care | Payer: Medicare Other | Attending: Emergency Medicine | Admitting: Emergency Medicine

## 2019-10-13 DIAGNOSIS — Z9889 Other specified postprocedural states: Secondary | ICD-10-CM | POA: Diagnosis not present

## 2019-10-13 DIAGNOSIS — R0902 Hypoxemia: Secondary | ICD-10-CM | POA: Diagnosis not present

## 2019-10-13 DIAGNOSIS — Z87891 Personal history of nicotine dependence: Secondary | ICD-10-CM | POA: Insufficient documentation

## 2019-10-13 DIAGNOSIS — I2510 Atherosclerotic heart disease of native coronary artery without angina pectoris: Secondary | ICD-10-CM | POA: Insufficient documentation

## 2019-10-13 DIAGNOSIS — R509 Fever, unspecified: Secondary | ICD-10-CM | POA: Insufficient documentation

## 2019-10-13 DIAGNOSIS — Z96651 Presence of right artificial knee joint: Secondary | ICD-10-CM | POA: Diagnosis not present

## 2019-10-13 DIAGNOSIS — Z20822 Contact with and (suspected) exposure to covid-19: Secondary | ICD-10-CM | POA: Insufficient documentation

## 2019-10-13 DIAGNOSIS — M7989 Other specified soft tissue disorders: Secondary | ICD-10-CM | POA: Insufficient documentation

## 2019-10-13 DIAGNOSIS — M25561 Pain in right knee: Secondary | ICD-10-CM

## 2019-10-13 DIAGNOSIS — R52 Pain, unspecified: Secondary | ICD-10-CM | POA: Diagnosis not present

## 2019-10-13 LAB — COMPREHENSIVE METABOLIC PANEL
ALT: 13 U/L (ref 0–44)
AST: 20 U/L (ref 15–41)
Albumin: 3.1 g/dL — ABNORMAL LOW (ref 3.5–5.0)
Alkaline Phosphatase: 70 U/L (ref 38–126)
Anion gap: 10 (ref 5–15)
BUN: 14 mg/dL (ref 8–23)
CO2: 26 mmol/L (ref 22–32)
Calcium: 8.6 mg/dL — ABNORMAL LOW (ref 8.9–10.3)
Chloride: 102 mmol/L (ref 98–111)
Creatinine, Ser: 0.62 mg/dL (ref 0.44–1.00)
GFR calc Af Amer: >60 mL/min (ref >60)
GFR calc non Af Amer: >60 mL/min (ref >60)
Glucose, Bld: 116 mg/dL — ABNORMAL HIGH (ref 70–99)
Potassium: 3.6 mmol/L (ref 3.5–5.1)
Sodium: 138 mmol/L (ref 135–145)
Total Bilirubin: 0.7 mg/dL (ref 0.3–1.2)
Total Protein: 6.5 g/dL (ref 6.5–8.1)

## 2019-10-13 LAB — CBC WITH DIFFERENTIAL/PLATELET
Abs Immature Granulocytes: 0.16 10*3/uL — ABNORMAL HIGH (ref 0.00–0.07)
Basophils Absolute: 0 10*3/uL (ref 0.0–0.1)
Basophils Relative: 0 %
Eosinophils Absolute: 0.1 10*3/uL (ref 0.0–0.5)
Eosinophils Relative: 1 %
HCT: 28.2 % — ABNORMAL LOW (ref 36.0–46.0)
Hemoglobin: 8.9 g/dL — ABNORMAL LOW (ref 12.0–15.0)
Immature Granulocytes: 2 %
Lymphocytes Relative: 17 %
Lymphs Abs: 1.3 10*3/uL (ref 0.7–4.0)
MCH: 30.8 pg (ref 26.0–34.0)
MCHC: 31.6 g/dL (ref 30.0–36.0)
MCV: 97.6 fL (ref 80.0–100.0)
Monocytes Absolute: 0.9 10*3/uL (ref 0.1–1.0)
Monocytes Relative: 12 %
Neutro Abs: 5.1 10*3/uL (ref 1.7–7.7)
Neutrophils Relative %: 68 %
Platelets: 311 10*3/uL (ref 150–400)
RBC: 2.89 MIL/uL — ABNORMAL LOW (ref 3.87–5.11)
RDW: 14.4 % (ref 11.5–15.5)
WBC: 7.5 10*3/uL (ref 4.0–10.5)
nRBC: 0.5 % — ABNORMAL HIGH (ref 0.0–0.2)

## 2019-10-13 MED ORDER — SODIUM CHLORIDE 0.9 % IV BOLUS
500.0000 mL | Freq: Once | INTRAVENOUS | Status: AC
  Administered 2019-10-14: 500 mL via INTRAVENOUS

## 2019-10-13 NOTE — ED Triage Notes (Signed)
86 yo female BIBA from home c/o of increased right knee pain 1 week post op, after total knee replacement. Pt also complaining of fever of 100.3 at home. EMS reports temp of 98.7 upon arrival.  No other complaints at this time.   Vitals: 124/70 bp 96 hr 20 rr 97% on room air

## 2019-10-13 NOTE — Discharge Summary (Signed)
Physician Discharge Summary   Patient ID: Lucila Poplar MRN: [id number] DOB/AGE: 1929/10/27 86 y.o.  Admit date: 10/06/2019 Discharge date: 10/09/2019  Primary Diagnosis: Osteoarthritis, right knee   Admission Diagnoses:  Past Medical History:  Diagnosis Date  . Adenomatous colon polyp   . Asthma   . Basal cell carcinoma 03/13/2017   left forehead - CX3 + cautery + 5FU  . CAD (coronary artery disease)    Chest CTA 10/07: 40% or less pLAD;  ETT 8/09: negative  . Carotid bruit   . Chronic back pain   . Depression   . Diverticulosis   . DJD (degenerative joint disease)   . Dyslipidemia   . Dysrhythmia    pvcs  . Fatty liver   . GERD (gastroesophageal reflux disease)   . HTN (hypertension)   . Hypothyroidism   . IBS (irritable bowel syndrome)   . Internal hemorrhoid   . Melanoma ([place])    metastatic; left leg; s/p multiple excisions  . Migraine   . Osteoporosis   . Spinal stenosis   . Squamous cell carcinoma of skin 09/02/2015   mid chest - CX3 + 5FU  . Squamous cell carcinoma of skin 11/07/2017   left wrist - tx p bx  . Squamous cell carcinoma of skin 12/11/2017   left hand - tx p bx   Discharge Diagnoses:   Principal Problem:   OA (osteoarthritis) of knee Active Problems:   Primary osteoarthritis of right knee  Estimated body mass index is 22.47 kg/m as calculated from the following:   Height as of this encounter: 5\' 5"  (1.651 m).   Weight as of this encounter: 61.2 kg.  Procedure:  Procedure(s) (LRB): TOTAL KNEE ARTHROPLASTY (Right)   Consults: None  HPI: Yasenia Rizzuto is a 86 y.o. year old female with end stage OA of her right knee with progressively worsening pain and dysfunction. She has constant pain, with activity and at rest and significant functional deficits with difficulties even with ADLs. She has had extensive non-op management including analgesics, injections of cortisone and viscosupplements, and home exercise program, but remains in significant pain  with significant dysfunction.Radiographs show bone on bone arthritis medial and patellofemoral. She presents now for right Total Knee Arthroplasty.    Laboratory Data: Admission on 10/06/2019, Discharged on 10/09/2019  Component Date Value Ref Range Status  . ABO/RH(D) 10/06/2019 A POS   Final  . Antibody Screen 10/06/2019 NEG   Final  . Sample Expiration 10/06/2019 10/09/2019,2359   Final  . Unit Number 10/06/2019 [id number]   Final  . Blood Component Type 10/06/2019 RED CELLS,LR   Final  . Unit division 10/06/2019 00   Final  . Status of Unit 10/06/2019 REL FROM [facility]   Final  . Transfusion Status 10/06/2019 OK TO TRANSFUSE   Final  . Crossmatch Result 10/06/2019    Final                   Value:COMPATIBLE Performed at [facility], [place] [address], [place], [place] [address]   . Unit Number 10/06/2019 [id number]   Final  . Blood Component Type 10/06/2019 RBC LR PHER2   Final  . Unit division 10/06/2019 00   Final  . Status of Unit 10/06/2019 REL FROM [facility]   Final  . Transfusion Status 10/06/2019 OK TO TRANSFUSE   Final  . Crossmatch Result 10/06/2019 COMPATIBLE   Final  . Blood Product Unit Number 10/06/2019 [id number]   Final  . PRODUCT CODE 10/06/2019  [id number]   Final  . Unit Type and Rh 10/06/2019 6200   Final  . Blood Product Expiration Date 10/06/2019 [id number]   Final  . Blood Product Unit Number 10/06/2019 [id number]   Final  . PRODUCT CODE 10/06/2019 [id number]   Final  . Unit Type and Rh 10/06/2019 6200   Final  . Blood Product Expiration Date 10/06/2019 [id number]   Final  . WBC 10/07/2019 13.3* 4.0 - 10.5 K/uL Final  . RBC 10/07/2019 3.02* 3.87 - 5.11 MIL/uL Final  . Hemoglobin 10/07/2019 9.3* 12.0 - 15.0 g/dL Final  . HCT 10/07/2019 29.6* 36.0 - 46.0 % Final  . MCV 10/07/2019 98.0  80.0 - 100.0 fL Final  . MCH 10/07/2019 30.8  26.0 - 34.0 pg Final  . MCHC 10/07/2019 31.4  30.0 - 36.0 g/dL Final  . RDW 10/07/2019 14.1  11.5 - 15.5  % Final  . Platelets 10/07/2019 182  150 - 400 K/uL Final  . nRBC 10/07/2019 0.0  0.0 - 0.2 % Final   Performed at [facility], [place] [address] [place], [place] [address]  . Sodium 10/07/2019 136  135 - 145 mmol/L Final  . Potassium 10/07/2019 3.5  3.5 - 5.1 mmol/L Final  . Chloride 10/07/2019 100  98 - 111 mmol/L Final  . CO2 10/07/2019 26  22 - 32 mmol/L Final  . Glucose, Bld 10/07/2019 129* 70 - 99 mg/dL Final   Glucose reference range applies only to samples taken after fasting for at least 8 hours.  . BUN 10/07/2019 12  8 - 23 mg/dL Final  . Creatinine, Ser 10/07/2019 0.65  0.44 - 1.00 mg/dL Final  . Calcium 10/07/2019 8.1* 8.9 - 10.3 mg/dL Final  . GFR calc non Af Amer 10/07/2019 >60  >60 mL/min Final  . GFR calc Af Amer 10/07/2019 >60  >60 mL/min Final  . Anion gap 10/07/2019 10  5 - 15 Final   Performed at [facility], [place] [address] [place], [place] [address]  . WBC 10/08/2019 9.5  4.0 - 10.5 K/uL Final  . RBC 10/08/2019 2.61* 3.87 - 5.11 MIL/uL Final  . Hemoglobin 10/08/2019 8.2* 12.0 - 15.0 g/dL Final  . HCT 10/08/2019 25.7* 36.0 - 46.0 % Final  . MCV 10/08/2019 98.5  80.0 - 100.0 fL Final  . MCH 10/08/2019 31.4  26.0 - 34.0 pg Final  . MCHC 10/08/2019 31.9  30.0 - 36.0 g/dL Final  . RDW 10/08/2019 14.2  11.5 - 15.5 % Final  . Platelets 10/08/2019 168  150 - 400 K/uL Final  . nRBC 10/08/2019 0.0  0.0 - 0.2 % Final   Performed at [facility], [place] [address], [place], [place] [address]  . Sodium 10/08/2019 141  135 - 145 mmol/L Final  . Potassium 10/08/2019 3.2* 3.5 - 5.1 mmol/L Final  . Chloride 10/08/2019 101  98 - 111 mmol/L Final  . CO2 10/08/2019 30  22 - 32 mmol/L Final  . Glucose, Bld 10/08/2019 138* 70 - 99 mg/dL Final   Glucose reference range applies only to samples taken after fasting for at least 8 hours.  . BUN 10/08/2019 10  8 - 23 mg/dL Final  . Creatinine, Ser 10/08/2019 0.60  0.44 - 1.00 mg/dL  Final  . Calcium 10/08/2019 8.7* 8.9 - 10.3 mg/dL Final  . GFR calc non Af Amer 10/08/2019 >60  >60 mL/min Final  . GFR calc Af Amer 10/08/2019 >60  >60 mL/min Final  .  Anion gap 10/08/2019 10  5 - 15 Final   Performed at [facility], [place] [address] [place], [place] [address]  . WBC 10/09/2019 7.9  4.0 - 10.5 K/uL Final  . RBC 10/09/2019 2.57* 3.87 - 5.11 MIL/uL Final  . Hemoglobin 10/09/2019 7.9* 12.0 - 15.0 g/dL Final  . HCT 10/09/2019 25.3* 36.0 - 46.0 % Final  . MCV 10/09/2019 98.4  80.0 - 100.0 fL Final  . MCH 10/09/2019 30.7  26.0 - 34.0 pg Final  . MCHC 10/09/2019 31.2  30.0 - 36.0 g/dL Final  . RDW 10/09/2019 14.3  11.5 - 15.5 % Final  . Platelets 10/09/2019 170  150 - 400 K/uL Final  . nRBC 10/09/2019 0.0  0.0 - 0.2 % Final   Performed at [facility], [place] [address] [place], [place] [address]  Hospital Outpatient Visit on 10/02/2019  Component Date Value Ref Range Status  . SARS Coronavirus 2 10/02/2019 NEGATIVE  NEGATIVE Final   Comment: (NOTE) SARS-CoV-2 target nucleic acids are NOT DETECTED. The SARS-CoV-2 RNA is generally detectable in upper and lower respiratory specimens during the acute phase of infection. Negative results do not preclude SARS-CoV-2 infection, do not rule out co-infections with other pathogens, and should not be used as the sole basis for treatment or other patient management decisions. Negative results must be combined with clinical observations, patient history, and epidemiological information. The expected result is Negative. Fact Sheet for Patients: SugarRoll.be Fact Sheet for Healthcare Providers: https://www.woods-mathews.com/ This test is not yet approved or cleared by the [place] FDA and  has been authorized for detection and/or diagnosis of SARS-CoV-2 by FDA under an Emergency Use Authorization (EUA). This EUA will remain  in effect (meaning this test  can be used) for the duration of the COVID-19 declaration under Section 56                          4(b)(1) of the Act, 21 U.S.C. section 360bbb-3(b)(1), unless the authorization is terminated or revoked sooner. Performed at [facility], [place] [address], [place], [place] [address]   Hospital Outpatient Visit on 09/24/2019  Component Date Value Ref Range Status  . MRSA, PCR 09/24/2019 NEGATIVE  NEGATIVE Final  . Staphylococcus aureus 09/24/2019 NEGATIVE  NEGATIVE Final   Comment: (NOTE) The Xpert SA Assay (FDA approved for NASAL specimens in patients 60 years of age and older), is one component of a comprehensive surveillance program. It is not intended to diagnose infection nor to guide or monitor treatment. Performed at [facility], [place] [address], [place], [place] [address]   . aPTT 09/24/2019 32  24 - 36 seconds Final   Performed at [facility], [place] [address], [place], [place] [address]  . WBC 09/24/2019 6.3  4.0 - 10.5 K/uL Final  . RBC 09/24/2019 3.81* 3.87 - 5.11 MIL/uL Final  . Hemoglobin 09/24/2019 12.6  12.0 - 15.0 g/dL Final  . HCT 09/24/2019 37.8  36.0 - 46.0 % Final  . MCV 09/24/2019 99.2  80.0 - 100.0 fL Final  . MCH 09/24/2019 33.1  26.0 - 34.0 pg Final  . MCHC 09/24/2019 33.3  30.0 - 36.0 g/dL Final  . RDW 09/24/2019 15.7* 11.5 - 15.5 % Final  . Platelets 09/24/2019 206  150 - 400 K/uL Final  . nRBC 09/24/2019 0.0  0.0 - 0.2 % Final   Performed at [facility], [place]  [address] [place], [place] [address]  . Sodium 09/24/2019 140  135 - 145 mmol/L Final  . Potassium 09/24/2019 3.8  3.5 - 5.1 mmol/L Final  . Chloride 09/24/2019 102  98 - 111 mmol/L Final  . CO2 09/24/2019 28  22 - 32 mmol/L Final  . Glucose, Bld 09/24/2019 107* 70 - 99 mg/dL Final   Glucose reference range applies only to samples taken after fasting for at least 8 hours.  . BUN 09/24/2019 18  8 - 23 mg/dL Final  . Creatinine, Ser  09/24/2019 0.87  0.44 - 1.00 mg/dL Final  . Calcium 09/24/2019 9.3  8.9 - 10.3 mg/dL Final  . Total Protein 09/24/2019 7.2  6.5 - 8.1 g/dL Final  . Albumin 09/24/2019 4.2  3.5 - 5.0 g/dL Final  . AST 09/24/2019 24  15 - 41 U/L Final  . ALT 09/24/2019 21  0 - 44 U/L Final  . Alkaline Phosphatase 09/24/2019 80  38 - 126 U/L Final  . Total Bilirubin 09/24/2019 0.5  0.3 - 1.2 mg/dL Final  . GFR calc non Af Amer 09/24/2019 60* >60 mL/min Final  . GFR calc Af Amer 09/24/2019 >60  >60 mL/min Final  . Anion gap 09/24/2019 10  5 - 15 Final   Performed at [facility], [place] [address], [place], [place] [address]  . Prothrombin Time 09/24/2019 12.9  11.4 - 15.2 seconds Final  . INR 09/24/2019 1.0  0.8 - 1.2 Final   Comment: (NOTE) INR goal varies based on device and disease states. Performed at [facility], [place] [address] [place], [place] [address]   . ABO/RH(D) 09/24/2019 A POS   Final  . Antibody Screen 09/24/2019 NEG   Final  . Sample Expiration 09/24/2019 10/05/2019,2359   Final  . Antibody Identification [id number]    Final                   Value:NON SPECIFIC COLD ANTIBODY Performed at [facility], [place] [address], [place],  [address]   . Unit Number 09/24/2019 [id number]   Final  . Blood Component Type 09/24/2019 RED CELLS,LR   Final  . Unit division 09/24/2019 00   Final  . Status of Unit 09/24/2019 REL FROM [facility]   Final  . Transfusion Status 09/24/2019 OK TO TRANSFUSE   Final  . Crossmatch Result 09/24/2019 COMPATIBLE   Final  . Unit Number 09/24/2019 [id number]   Final  . Blood Component Type 09/24/2019 RBC LR PHER2   Final  . Unit division 09/24/2019 00   Final  . Status of Unit 09/24/2019 REL FROM [facility]   Final  . Transfusion Status 09/24/2019 OK TO TRANSFUSE   Final  . Crossmatch Result 09/24/2019 COMPATIBLE   Final  . Blood Product Unit Number 09/24/2019 [id number]   Final  . PRODUCT CODE 09/24/2019  [id number]   Final  . Unit Type and Rh 09/24/2019 6200   Final  . Blood Product Expiration Date 09/24/2019 [id number]   Final  . Blood Product Unit Number 09/24/2019 [id number]   Final  . PRODUCT CODE 09/24/2019 [id number]   Final  . Unit Type and Rh 09/24/2019 6200   Final  . Blood Product Expiration Date 09/24/2019 [id number]   Final     X-Rays:No results found.  EKG: Orders placed or performed during the hospital encounter of 12/06/18  . EKG 12-Lead  . EKG 12-Lead  . EKG 12-Lead  . EKG 12-Lead  . EKG 12-Lead  .  EKG 12-Lead  . EKG     Hospital Course: Ceili Laforgia is a 86 y.o. who was admitted to [facility]. They were brought to the operating room on 10/06/2019 and underwent Procedure(s): TOTAL KNEE ARTHROPLASTY.  Patient tolerated the procedure well and was later transferred to the recovery room and then to the orthopaedic floor for postoperative care. They were given PO and IV analgesics for pain control following their surgery. They were given 24 hours of postoperative antibiotics of  Anti-infectives (From admission, onward)   Start     Dose/Rate Route Frequency Ordered Stop   10/06/19 1630  ceFAZolin (ANCEF) IVPB 2g/100 mL premix     2 g 200 mL/hr over 30 Minutes Intravenous Every 6 hours 10/06/19 1330 10/06/19 2217   10/06/19 0830  ceFAZolin (ANCEF) IVPB 2g/100 mL premix     2 g 200 mL/hr over 30 Minutes Intravenous On call to O.R. 10/06/19 0825 10/06/19 1027     and started on DVT prophylaxis in the form of Aspirin.   PT and OT were ordered for total joint protocol. Discharge planning consulted to help with postop disposition and equipment needs. Patient had a good night on the evening of surgery. They started to get up OOB with therapy on POD #0. Hemovac drain was pulled without difficulty on day one. Continued to work with therapy into POD #2.  She had slower progression with therapy, but was meeting her goals by POD #3. Dressing was changed and the incision was  clean, dry and intact. Hemoglobin was 7.9, sent home with two weeks worth of iron. Pt worked with therapy for one additional session and was meeting their goals. She was discharged to home later that day in stable condition.  Diet: Regular diet Activity: WBAT Follow-up: in 2 weeks Disposition: Home with HHPT Discharged Condition: stable   Discharge Instructions    Call MD / Call 911   Complete by: As directed    If you experience chest pain or shortness of breath, CALL 911 and be transported to the hospital emergency room.  If you develope a fever above 101 F, pus (white drainage) or increased drainage or redness at the wound, or calf pain, call your surgeon's office.   Change dressing   Complete by: As directed    You may remove the bulky bandage (ACE wrap and gauze) two days after surgery. You will have an adhesive waterproof bandage underneath. Leave this in place until your first follow-up appointment.   Constipation Prevention   Complete by: As directed    Drink plenty of fluids.  Prune juice may be helpful.  You may use a stool softener, such as Colace (over the counter) 100 mg twice a day.  Use MiraLax (over the counter) for constipation as needed.   Diet - low sodium heart healthy   Complete by: As directed    Do not put a pillow under the knee. Place it under the heel.   Complete by: As directed    Driving restrictions   Complete by: As directed    No driving for two weeks   TED hose   Complete by: As directed    Use stockings (TED hose) for three weeks on both leg(s).  You may remove them at night for sleeping.   Weight bearing as tolerated   Complete by: As directed      Allergies as of 10/09/2019      Reactions   Buprenorphine Hcl Itching, Other (See Comments)  Happened a while back. Patient noted that any narcotic based medication makes her have a bad reaction.   Codeine Nausea And Vomiting, Other (See Comments)   Patient gets sick on stomach   Morphine And Related  Itching, Other (See Comments)   Happened a while back. Patient noted that any narcotic based medication makes her have a bad reaction.   Tramadol Nausea And Vomiting, Other (See Comments)   Confusion Can tolerate one tablet at night   Adhesive [tape] Other (See Comments)   Tears skin   Hydrocodone-acetaminophen Nausea And Vomiting   Lactose Intolerance (gi) Other (See Comments)   Gi upset   Oxycodone Nausea And Vomiting      Medication List    STOP taking these medications   Aleve PM 220-25 MG Tabs Generic drug: Naproxen Sod-diphenhydrAMINE     TAKE these medications   acetaminophen 500 MG tablet Commonly known as: TYLENOL Take 500 mg by mouth daily.   alendronate 70 MG tablet Commonly known as: FOSAMAX Take 70 mg by mouth every 7 (seven) days.   aspirin 325 MG EC tablet Take 1 tablet (325 mg total) by mouth 2 (two) times daily for 18 days. Then take one 81 mg aspirin once a day for three weeks. Then discontinue aspirin.   Biotin 5 MG Caps Take 5 mg by mouth daily.   calcium carbonate 1250 (500 Ca) MG chewable tablet Commonly known as: OS-CAL Chew 2 tablets by mouth daily.   chlorthalidone 25 MG tablet Commonly known as: HYGROTON Take 1 tablet (25 mg total) by mouth daily. Only takes if bp over [id number] systolic What changed:   when to take this  reasons to take this   esomeprazole 20 MG capsule Commonly known as: NEXIUM Take 20 mg by mouth daily.   estradiol 0.1 MG/GM vaginal cream Commonly known as: ESTRACE Place 1 Applicatorful vaginally every other day.   ferrous sulfate 325 (65 FE) MG EC tablet Take 1 tablet (325 mg total) by mouth 2 (two) times daily for 14 days.   fluticasone 50 MCG/ACT nasal spray Commonly known as: FLONASE Place 2 sprays into both nostrils every morning.   gabapentin 300 MG capsule Commonly known as: NEURONTIN Take 300 mg by mouth 2 (two) times daily.   levothyroxine 75 MCG tablet Commonly known as: SYNTHROID Take 75 mcg by  mouth daily with breakfast.   loratadine 10 MG tablet Commonly known as: CLARITIN Take 10 mg by mouth daily.   methocarbamol 500 MG tablet Commonly known as: ROBAXIN Take 1 tablet (500 mg total) by mouth every 6 (six) hours as needed for muscle spasms.   MiraLax 17 GM/SCOOP powder Generic drug: polyethylene glycol powder Take 8.5 g by mouth daily.   potassium chloride SA 20 MEQ tablet Commonly known as: KLOR-CON Take 20 mEq by mouth daily as needed (takes with HCTZ).   PROBIOTIC PO Take 1 capsule by mouth daily.   Retin-A 0.05 % cream Generic drug: tretinoin Apply 1 application topically every other day.   rosuvastatin 10 MG tablet Commonly known as: CRESTOR Take 10 mg by mouth at bedtime.   tapentadol 50 MG tablet Commonly known as: NUCYNTA Take 1-2 tablets (50-100 mg total) by mouth every 6 (six) hours as needed for moderate pain or severe pain.   trimethoprim 100 MG tablet Commonly known as: TRIMPEX Take 100 mg by mouth daily. UTI prevention   V-R MAGNESIUM 250 MG Tabs Generic drug: Magnesium Take 250 mg by mouth daily.   vitamin B-12  500 MCG tablet Commonly known as: CYANOCOBALAMIN Take 500 mcg by mouth daily.   Vitamin D3 50 MCG (2000 UT) Tabs Take 2,000 Units by mouth daily.            Discharge Care Instructions  (From admission, onward)         Start     Ordered   10/09/19 0000  Weight bearing as tolerated     10/09/19 0757   10/09/19 0000  Change dressing    Comments: You may remove the bulky bandage (ACE wrap and gauze) two days after surgery. You will have an adhesive waterproof bandage underneath. Leave this in place until your first follow-up appointment.   10/09/19 0757         Follow-up Information    Gaynelle Arabian, MD. Schedule an appointment as soon as possible for a visit on 10/21/2019.   Specialty: Orthopedic Surgery Contact information: [address] [place] [address] [id number]        [facility].   Specialty: Home Health Services Why: agency will provide home health physical therapy and aide. Contact information: [address] [place] [address] [phone]           Signed: Theresa Duty, PA-C Orthopedic Surgery 10/13/2019, 12:00 PM

## 2019-10-14 DIAGNOSIS — R52 Pain, unspecified: Secondary | ICD-10-CM | POA: Diagnosis not present

## 2019-10-14 DIAGNOSIS — Z7401 Bed confinement status: Secondary | ICD-10-CM | POA: Diagnosis not present

## 2019-10-14 DIAGNOSIS — M2550 Pain in unspecified joint: Secondary | ICD-10-CM | POA: Diagnosis not present

## 2019-10-14 LAB — URINALYSIS, ROUTINE W REFLEX MICROSCOPIC
Bilirubin Urine: NEGATIVE
Glucose, UA: NEGATIVE mg/dL
Hgb urine dipstick: NEGATIVE
Ketones, ur: NEGATIVE mg/dL
Leukocytes,Ua: NEGATIVE
Nitrite: NEGATIVE
Protein, ur: NEGATIVE mg/dL
Specific Gravity, Urine: 1.012 (ref 1.005–1.030)
pH: 8 (ref 5.0–8.0)

## 2019-10-14 LAB — SARS CORONAVIRUS 2 BY RT PCR (HOSPITAL ORDER, PERFORMED IN ~~LOC~~ HOSPITAL LAB): SARS Coronavirus 2: NEGATIVE

## 2019-10-14 MED ORDER — TAPENTADOL HCL 50 MG PO TABS
50.0000 mg | ORAL_TABLET | Freq: Once | ORAL | Status: AC
  Administered 2019-10-14: 50 mg via ORAL
  Filled 2019-10-14: qty 1

## 2019-10-14 MED ORDER — METHOCARBAMOL 500 MG PO TABS
500.0000 mg | ORAL_TABLET | Freq: Once | ORAL | Status: AC
  Administered 2019-10-14: 500 mg via ORAL
  Filled 2019-10-14: qty 1

## 2019-10-14 NOTE — ED Provider Notes (Signed)
[facility] DEPT Provider Note   CSN: [id number] Arrival date & time: 10/13/19  2206     History Chief Complaint  Patient presents with  . Knee Pain    post op knee pain    Kerri [name] is a 86 y.o. female.  Presents to emergency room with chief complaint of knee pain.  Has had knee pain ever since surgery, seems to be worse over the past couple days.  States that she has had some swelling in redness over her leg ever since surgery, unsure if this has changed.  Temp up to 100.6 at home.  No chest pain, abdominal pain, dysuria, cough.  Completed chart review, total knee arthroplasty with Dr. Reynaldo Minium on [id number], no complications.  HPI     Past Medical History:  Diagnosis Date  . Adenomatous colon polyp   . Asthma   . Basal cell carcinoma 03/13/2017   left forehead - CX3 + cautery + 5FU  . CAD (coronary artery disease)    Chest CTA 10/07: 40% or less pLAD;  ETT 8/09: negative  . Carotid bruit   . Chronic back pain   . Depression   . Diverticulosis   . DJD (degenerative joint disease)   . Dyslipidemia   . Dysrhythmia    pvcs  . Fatty liver   . GERD (gastroesophageal reflux disease)   . HTN (hypertension)   . Hypothyroidism   . IBS (irritable bowel syndrome)   . Internal hemorrhoid   . Melanoma ([place])    metastatic; left leg; s/p multiple excisions  . Migraine   . Osteoporosis   . Spinal stenosis   . Squamous cell carcinoma of skin 09/02/2015   mid chest - CX3 + 5FU  . Squamous cell carcinoma of skin 11/07/2017   left wrist - tx p bx  . Squamous cell carcinoma of skin 12/11/2017   left hand - tx p bx    Patient Active Problem List   Diagnosis Date Noted  . Primary osteoarthritis of right knee 10/06/2019  . Palpitations 03/29/2019  . 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 Melanoma ([place])   . Dizziness 01/30/2017  . Lumbar stenosis with neurogenic claudication 11/13/2013  . Sebaceous cyst 11/20/2012  . Squamous cell carcinoma  in situ of skin of calf 04/03/2012  . Postop Hypokalemia 09/08/2011  . Postop Acute blood loss anemia 09/05/2011  . Osteoarthritis of hip 09/04/2011  . Melanoma of lower leg ([place]) 05/03/2011  . PVC's (premature ventricular contractions) 07/30/2009  . HYPOTHYROIDISM 05/10/2009  . DYSLIPIDEMIA 05/10/2009  . Essential hypertension 05/10/2009  . CAD 05/10/2009  . OA (osteoarthritis) of knee 05/10/2009  . SPINAL STENOSIS 05/10/2009  . CAROTID BRUIT 05/10/2009    Past Surgical History:  Procedure Laterality Date  . BACK SURGERY    . BLEPHAROPLASTY    . BUNIONECTOMY WITH HAMMERTOE RECONSTRUCTION Right 01/16/2013   Procedure: RIGHT Barbie Banner OSTEOTOMY WITH SIMPLE BUNIONECTOMY AND SECOND HAMMER TOE CORRECTION;  Surgeon: Wylene Simmer, MD;  Location: [facility];  Service: Orthopedics;  Laterality: Right;  . CARPAL TUNNEL RELEASE    . KNEE ARTHROSCOPY Right   . LUMBAR LAMINECTOMY/DECOMPRESSION MICRODISCECTOMY N/A 11/13/2013   Procedure: LUMBAR TWO-THREE,LUMBAR THREE-FOUR,LUMBAR FOUR-FIVE LAMINECTOMY AND FORAMINOTOMY;  Surgeon: Ophelia Charter, MD;  Location: [facility];  Service: Neurosurgery;  Laterality: N/A;  . MELANOMA EXCISION Left 09/19/2012   Procedure: WIDE EXCISION METASTATIC MELANOMA LEFT MEDIAL THIGH;  Surgeon: Zenovia Jarred, MD;  Location: [facility];  Service: General;  Laterality: Left;  . ROTATOR CUFF REPAIR    . SHOULDER SURGERY    . TOE SURGERY    . TOTAL HIP ARTHROPLASTY  09/04/2011   Procedure: TOTAL HIP ARTHROPLASTY;  Surgeon: Gearlean Alf, MD;  Location: [facility];  Service: Orthopedics;  Laterality: Left;  . TOTAL HIP REVISION Left 11/27/2018   Procedure: Left hip femoral revision;  Surgeon: Gaynelle Arabian, MD;  Location: [facility];  Service: Orthopedics;  Laterality: Left;  168min  . TOTAL KNEE ARTHROPLASTY Right 10/06/2019   Procedure: TOTAL KNEE ARTHROPLASTY;  Surgeon: Gaynelle Arabian, MD;  Location: [facility];  Service: Orthopedics;  Laterality:  Right;  44min     OB History   No obstetric history on file.     Family History  Problem Relation Age of Onset  . CAD Mother   . CAD Father   . Kidney disease Child     Social History   Tobacco Use  . Smoking status: Former Smoker    Types: Cigarettes    Quit date: 08/31/1948    Years since quitting: 71.1  . Smokeless tobacco: Never Used  Substance Use Topics  . Alcohol use: Yes    Alcohol/week: 2.0 standard drinks    Types: 2 Glasses of wine per week  . Drug use: No    Home Medications Prior to Admission medications   Medication Sig Start Date End Date Taking? Authorizing Provider  acetaminophen (TYLENOL) 500 MG tablet Take 500 mg by mouth daily.    [provider]  alendronate (FOSAMAX) 70 MG tablet Take 70 mg by mouth every 7 (seven) days. 11/15/18   [provider]  aspirin EC 325 MG EC tablet Take 1 tablet (325 mg total) by mouth 2 (two) times daily for 18 days. Then take one 81 mg aspirin once a day for three weeks. Then discontinue aspirin. 10/09/19 10/27/19  Edmisten, Ok Anis, PA  Biotin 5 MG CAPS Take 5 mg by mouth daily.    [provider]  calcium carbonate (OS-CAL) 1250 (500 Ca) MG chewable tablet Chew 2 tablets by mouth daily.     [provider]  chlorthalidone (HYGROTON) 25 MG tablet Take 1 tablet (25 mg total) by mouth daily. Only takes if bp over [id number] systolic Patient taking differently: Take 25 mg by mouth daily as needed. Only takes if bp over [id number] systolic [id number]   Minus Breeding, MD  Cholecalciferol (VITAMIN D3) 50 MCG (2000 UT) TABS Take 2,000 Units by mouth daily.    [provider]  esomeprazole (NEXIUM) 20 MG capsule Take 20 mg by mouth daily.     [provider]  estradiol (ESTRACE) 0.1 MG/GM vaginal cream Place 1 Applicatorful vaginally every other day.  06/06/19   [provider]  ferrous sulfate 325 (65 FE) MG EC tablet Take 1 tablet (325 mg total) by mouth 2 (two) times daily for 14  days. 10/09/19 10/23/19  Edmisten, Ok Anis, PA  fluticasone (FLONASE) 50 MCG/ACT nasal spray Place 2 sprays into both nostrils every morning.  03/29/19   [provider]  gabapentin (NEURONTIN) 300 MG capsule Take 300 mg by mouth 2 (two) times daily.     [provider]  levothyroxine (SYNTHROID, LEVOTHROID) 75 MCG tablet Take 75 mcg by mouth daily with breakfast.    [provider]  loratadine (CLARITIN) 10 MG tablet Take 10 mg by mouth daily.     [provider]  Magnesium (V-R MAGNESIUM) 250 MG TABS Take  250 mg by mouth daily.     [provider]  methocarbamol (ROBAXIN) 500 MG tablet Take 1 tablet (500 mg total) by mouth every 6 (six) hours as needed for muscle spasms. 10/09/19   Edmisten, Kristie L, PA  polyethylene glycol powder (MIRALAX) 17 GM/SCOOP powder Take 8.5 g by mouth daily.     [provider]  potassium chloride SA (KLOR-CON) 20 MEQ tablet Take 20 mEq by mouth daily as needed (takes with HCTZ).     [provider]  Probiotic Product (PROBIOTIC PO) Take 1 capsule by mouth daily.    [provider]  rosuvastatin (CRESTOR) 10 MG tablet Take 10 mg by mouth at bedtime.     [provider]  tapentadol (NUCYNTA) 50 MG tablet Take 1-2 tablets (50-100 mg total) by mouth every 6 (six) hours as needed for moderate pain or severe pain. 10/09/19   Edmisten, Kristie L, PA  tretinoin (RETIN-A) 0.05 % cream Apply 1 application topically every other day.     [provider]  trimethoprim (TRIMPEX) 100 MG tablet Take 100 mg by mouth daily. UTI prevention 08/04/19   [provider]  vitamin B-12 (CYANOCOBALAMIN) 500 MCG tablet Take 500 mcg by mouth daily.    [provider]    Allergies    Buprenorphine hcl, Codeine, Morphine and related, Tramadol, Adhesive [tape], Hydrocodone-acetaminophen, Lactose intolerance (gi), and Oxycodone  Review of Systems   Review of Systems  Constitutional: Positive  for chills and fatigue.  HENT: Negative for ear pain and sore throat.   Eyes: Negative for pain and visual disturbance.  Respiratory: Negative for cough and shortness of breath.   Cardiovascular: Negative for chest pain and palpitations.  Gastrointestinal: Negative for abdominal pain and vomiting.  Genitourinary: Negative for dysuria and hematuria.  Musculoskeletal: Positive for arthralgias. Negative for back pain.  Skin: Negative for color change and rash.  Neurological: Negative for seizures and syncope.  All other systems reviewed and are negative.   Physical Exam Updated Vital Signs BP (!) 129/54   Pulse 88   Temp 98.5 F (36.9 C)   Resp 20   Ht 5\' 5"  (1.651 m)   Wt 61.2 kg   SpO2 100%   BMI 22.45 kg/m   Physical Exam Vitals and nursing note reviewed.  Constitutional:      General: She is not in acute distress.    Appearance: She is well-developed.  HENT:     Head: Normocephalic and atraumatic.  Eyes:     Conjunctiva/sclera: Conjunctivae normal.  Cardiovascular:     Rate and Rhythm: Normal rate and regular rhythm.     Heart sounds: No murmur.  Pulmonary:     Effort: Pulmonary effort is normal. No respiratory distress.     Breath sounds: Normal breath sounds.  Abdominal:     Palpations: Abdomen is soft.     Tenderness: There is no abdominal tenderness.  Musculoskeletal:     Cervical back: Neck supple.     Comments: Right lower extremity: There is generalized mild swelling over right knee, mild redness, anterior knee dressing is intact, no purulence or drainage, normal PT/DP pulses, distal sensation intact, able to fully extend, there is some limitation on flexion due to pain  Skin:    General: Skin is warm and dry.  Neurological:     Mental Status: She is alert.     ED Results / Procedures / Treatments   Labs (all labs ordered are listed, but only abnormal results  are displayed) Labs Reviewed  CBC WITH DIFFERENTIAL/PLATELET - Abnormal; Notable for the  following components:      Result Value   RBC 2.89 (*)    Hemoglobin 8.9 (*)    HCT 28.2 (*)    nRBC 0.5 (*)    Abs Immature Granulocytes 0.16 (*)    All other components within normal limits  COMPREHENSIVE METABOLIC PANEL - Abnormal; Notable for the following components:   Glucose, Bld 116 (*)    Calcium 8.6 (*)    Albumin 3.1 (*)    All other components within normal limits  SARS CORONAVIRUS 2 BY RT PCR (HOSPITAL ORDER, [facility])  URINALYSIS, ROUTINE W REFLEX MICROSCOPIC     EKG None  Radiology DG Chest 1 View  Result Date: 10/13/2019 CLINICAL DATA:  Knee replacement on 10/06/2019, increasing pain and fever EXAM: CHEST  1 VIEW COMPARISON:  Radiograph 12/06/2018, CT 10/08/2008 FINDINGS: Lung volumes are slightly low. There are mixed streaky and patchy opacities particularly towards the bases as well as some mild central pulmonary vascular congestion. No convincing features of frank edema are seen at this time. No pneumothorax or visible effusion. Stable cardiomediastinal contours with a calcified aorta. No acute osseous or soft tissue abnormality. Degenerative changes are present in the imaged spine and shoulders. Telemetry leads overlie the chest. IMPRESSION: 1. Streaky and patchy opacities in the lung bases favor atelectasis though early infection or inflammation could have a similar appearance. 2. Mild central pulmonary vascular congestion without frank edema. Electronically Signed   By: Lovena Le M.D.   On: 10/13/2019 22:59   DG Knee Complete 4 Views Right  Result Date: 10/13/2019 CLINICAL DATA:  Right knee pain. Recent knee replacement 10/06/2019 increasing pain. Patient reports fever. EXAM: RIGHT KNEE - COMPLETE 4+ VIEW COMPARISON:  None. FINDINGS: Right knee arthroplasty in expected alignment. There is been patellar resurfacing. There is no periprosthetic fracture. Lobulated lucency extends proximal from the femoral component centrally approximately  3.5 cm. This appears to localize to bone rather than adjacent soft tissues. Margins are well-defined with narrow zone of transition. No sclerosis. Tibial component is unremarkable. Generalized soft tissue edema anteriorly. Joint effusion with scattered foci of air tracking along the distal femur. IMPRESSION: 1. Right knee arthroplasty in expected alignment. Tubular intra osseous lucency proximal to the central femoral component is nonspecific in radiographic appearance. No postoperative exams are available for comparison. Margins appear well defined. This may be simple postoperative change, however the possibility of focal fluid collection is difficult to exclude in the setting of fever. 2. Generalized soft tissue edema. Joint effusion with scattered foci of air in the joint space and soft tissues. Electronically Signed   By: Keith Rake M.D.   On: 10/13/2019 23:03    Procedures Procedures (including critical care time)  Medications Ordered in ED Medications  sodium chloride 0.9 % bolus 500 mL (500 mLs Intravenous New Bag/Given (Non-Interop) 10/14/19 0023)    ED Course  I have reviewed the triage vital signs and the nursing notes.  Pertinent labs & imaging results that were available during my care of the patient were reviewed by me and considered in my medical decision making (see chart for details).    MDM Rules/Calculators/A&P                      86 year old lady presenting to ER with concern for low-grade temperature, knee pain 1 week postop from right total knee replacement.  On  exam, patient is well-appearing overall.  Labs grossly wnl. CXR neg for pna.  She does have some swelling and mild redness over her knee.  X-ray had tubular intraosseous lucency proximal to the central femoral component.  Reviewed patient's exam, history and x-ray findings in detail with Dr. Alvan Dame, on-call for [facility].  He felt that all of these symptoms, changes were within the expected postop course and had  a very low clinical suspicion for infected knee, reported he would notify [facility]. Sent epic message to [facility] and asked daughter to call office in am to set up appt for close recheck. Discussed return precautions in detail with patient and daugther. Pain well controlled. Vitals remain stable. Anticipated discharge after UA.    While awaiting UA, signed out to [place] who will follow up on results.  Final Clinical Impression(s) / ED Diagnoses Final diagnoses:  Right knee pain, unspecified chronicity  Low grade fever    Rx / DC Orders ED Discharge Orders    None       Lucrezia Starch, MD 10/14/19 [phone]

## 2019-10-14 NOTE — ED Provider Notes (Signed)
Patient signed out at change of shift to get results of her urinalysis.  If her urinalysis looks fine she can be discharged to follow-up with her orthopedist today.  Results for orders placed or performed during the hospital encounter of 10/13/19  SARS Coronavirus 2 by RT PCR (hospital order, performed in [place] hospital lab) Nasopharyngeal Nasopharyngeal Swab   Specimen: Nasopharyngeal Swab  Result Value Ref Range   SARS Coronavirus 2 NEGATIVE NEGATIVE  Urinalysis, Routine w reflex microscopic  Result Value Ref Range   Color, Urine YELLOW YELLOW   APPearance CLEAR CLEAR   Specific Gravity, Urine 1.012 1.005 - 1.030   pH 8.0 5.0 - 8.0   Glucose, UA NEGATIVE NEGATIVE mg/dL   Hgb urine dipstick NEGATIVE NEGATIVE   Bilirubin Urine NEGATIVE NEGATIVE   Ketones, ur NEGATIVE NEGATIVE mg/dL   Protein, ur NEGATIVE NEGATIVE mg/dL   Nitrite NEGATIVE NEGATIVE   Leukocytes,Ua NEGATIVE NEGATIVE    Urinalysis is normal without UTI  Diagnoses that have been ruled out:  None  Diagnoses that are still under consideration:  None  Final diagnoses:  Right knee pain, unspecified chronicity  Low grade fever   Plan discharge  Rolland Porter, MD, Barbette Or, MD 10/14/19 [phone]

## 2019-10-14 NOTE — Discharge Instructions (Addendum)
Please follow-up today at [facility] as discussed with Dr. Roslynn Amble to have the orthopedists recheck your knee.  Your urine tonight was normal without signs of infection.

## 2019-10-14 NOTE — ED Notes (Signed)
Daughter called per Pt and PTAR request.  All belongings transported back home w/ patient.  Pt and daughter verbalized understanding of follow-up.

## 2019-10-14 NOTE — ED Notes (Signed)
Pt reports she is anxious to go home.  Updated on plan of care.  Dr. Tomi Bamberger made aware that UA has resulted.

## 2019-10-14 NOTE — ED Notes (Addendum)
PT[facility] called.  Went over paperwork w/ patient.  IV will need to be removed before final d/c.

## 2019-10-27 DIAGNOSIS — M25561 Pain in right knee: Secondary | ICD-10-CM | POA: Diagnosis not present

## 2019-10-29 DIAGNOSIS — M25561 Pain in right knee: Secondary | ICD-10-CM | POA: Diagnosis not present

## 2019-11-03 DIAGNOSIS — M25561 Pain in right knee: Secondary | ICD-10-CM | POA: Diagnosis not present

## 2019-11-06 DIAGNOSIS — M25561 Pain in right knee: Secondary | ICD-10-CM | POA: Diagnosis not present

## 2019-11-11 DIAGNOSIS — Z96651 Presence of right artificial knee joint: Secondary | ICD-10-CM | POA: Diagnosis not present

## 2019-11-11 DIAGNOSIS — Z471 Aftercare following joint replacement surgery: Secondary | ICD-10-CM | POA: Diagnosis not present

## 2019-11-11 DIAGNOSIS — M25561 Pain in right knee: Secondary | ICD-10-CM | POA: Diagnosis not present

## 2019-11-17 DIAGNOSIS — M25561 Pain in right knee: Secondary | ICD-10-CM | POA: Diagnosis not present

## 2019-11-19 DIAGNOSIS — M25561 Pain in right knee: Secondary | ICD-10-CM | POA: Diagnosis not present

## 2019-11-21 DIAGNOSIS — M25561 Pain in right knee: Secondary | ICD-10-CM | POA: Diagnosis not present

## 2019-11-26 DIAGNOSIS — M25561 Pain in right knee: Secondary | ICD-10-CM | POA: Diagnosis not present

## 2019-11-28 DIAGNOSIS — R399 Unspecified symptoms and signs involving the genitourinary system: Secondary | ICD-10-CM | POA: Diagnosis not present

## 2019-12-03 DIAGNOSIS — M25561 Pain in right knee: Secondary | ICD-10-CM | POA: Diagnosis not present

## 2019-12-05 DIAGNOSIS — M25561 Pain in right knee: Secondary | ICD-10-CM | POA: Diagnosis not present

## 2019-12-08 DIAGNOSIS — M25561 Pain in right knee: Secondary | ICD-10-CM | POA: Diagnosis not present

## 2019-12-10 DIAGNOSIS — M25561 Pain in right knee: Secondary | ICD-10-CM | POA: Diagnosis not present

## 2019-12-11 ENCOUNTER — Encounter: Payer: Self-pay | Admitting: *Deleted

## 2019-12-11 DIAGNOSIS — R399 Unspecified symptoms and signs involving the genitourinary system: Secondary | ICD-10-CM | POA: Diagnosis not present

## 2019-12-15 DIAGNOSIS — M25561 Pain in right knee: Secondary | ICD-10-CM | POA: Diagnosis not present

## 2019-12-16 ENCOUNTER — Other Ambulatory Visit: Payer: Self-pay

## 2019-12-16 ENCOUNTER — Ambulatory Visit: Payer: Medicare Other | Admitting: Physician Assistant

## 2019-12-16 DIAGNOSIS — Z85828 Personal history of other malignant neoplasm of skin: Secondary | ICD-10-CM

## 2019-12-16 DIAGNOSIS — L814 Other melanin hyperpigmentation: Secondary | ICD-10-CM

## 2019-12-16 DIAGNOSIS — Z86006 Personal history of melanoma in-situ: Secondary | ICD-10-CM

## 2019-12-16 DIAGNOSIS — L570 Actinic keratosis: Secondary | ICD-10-CM

## 2019-12-16 DIAGNOSIS — Z1283 Encounter for screening for malignant neoplasm of skin: Secondary | ICD-10-CM

## 2019-12-16 DIAGNOSIS — D229 Melanocytic nevi, unspecified: Secondary | ICD-10-CM

## 2019-12-16 DIAGNOSIS — D1800 Hemangioma unspecified site: Secondary | ICD-10-CM

## 2019-12-16 DIAGNOSIS — L821 Other seborrheic keratosis: Secondary | ICD-10-CM

## 2019-12-16 DIAGNOSIS — L578 Other skin changes due to chronic exposure to nonionizing radiation: Secondary | ICD-10-CM

## 2019-12-16 NOTE — Progress Notes (Signed)
   Follow-Up Visit   Subjective  Kerri [name] is a 86 y.o. female who presents for the following: No chief complaint on file..   The following portions of the chart were reviewed this encounter and updated as appropriate:     Objective  Well appearing patient in no apparent distress; mood and affect are within normal limits.  All skin waist up examined.  Objective  Chest - Medial [facility]), Mid Back (10): Erythematous patches with gritty scale.  Objective  Waist Up: No atypical nevi No signs of non-mole skin cancer.   Objective  Neck - Anterior: All scars clear  Objective  Left Knee - Anterior: Scars clear   Assessment & Plan  AK (actinic keratosis) (11) Chest - Medial (Center); Mid Back (10)  Destruction of lesion - Chest - Medial (Center), Mid Back Complexity: simple   Destruction method: cryotherapy   Informed consent: discussed and consent obtained   Timeout:  patient name, date of birth, surgical site, and procedure verified Lesion destroyed using liquid nitrogen: Yes   Outcome: patient tolerated procedure well with no complications   Post-procedure details: wound care instructions given    Screening exam for skin cancer Waist Up  observe  History of SCC (squamous cell carcinoma) of skin Neck - Anterior  Personal history of melanoma in-situ Left Knee - Anterior  Skin examinations  Lentigines - Scattered tan macules - Discussed due to sun exposure - Benign, observe - Call for any changes  Seborrheic Keratoses - Stuck-on, waxy, tan-brown papules and plaques  - Discussed benign etiology and prognosis. - Observe - Call for any changes  Melanocytic Nevi - Tan-brown and/or pink-flesh-colored symmetric macules and papules - Benign appearing on exam today - Observation - Call clinic for new or changing moles - Recommend daily use of broad spectrum spf 30+ sunscreen to sun-exposed areas.   Hemangiomas - Red papules - Discussed benign nature -  Observe - Call for any changes  Actinic Damage - diffuse scaly erythematous macules with underlying dyspigmentation - Recommend daily broad spectrum sunscreen SPF 30+ to sun-exposed areas, reapply every 2 hours as needed.  - Call for new or changing lesions.  Skin cancer screening performed today.   I, Carry Ortez, PA-C, have reviewed all documentation's for this visit.  The documentation on 12/16/19 for the exam, diagnosis, procedures and orders are all accurate and complete.

## 2019-12-17 DIAGNOSIS — M25561 Pain in right knee: Secondary | ICD-10-CM | POA: Diagnosis not present

## 2019-12-19 DIAGNOSIS — M25561 Pain in right knee: Secondary | ICD-10-CM | POA: Diagnosis not present

## 2019-12-23 DIAGNOSIS — M25561 Pain in right knee: Secondary | ICD-10-CM | POA: Diagnosis not present

## 2019-12-25 DIAGNOSIS — R350 Frequency of micturition: Secondary | ICD-10-CM | POA: Diagnosis not present

## 2019-12-25 DIAGNOSIS — N390 Urinary tract infection, site not specified: Secondary | ICD-10-CM | POA: Diagnosis not present

## 2019-12-25 DIAGNOSIS — N952 Postmenopausal atrophic vaginitis: Secondary | ICD-10-CM | POA: Diagnosis not present

## 2019-12-29 DIAGNOSIS — M25561 Pain in right knee: Secondary | ICD-10-CM | POA: Diagnosis not present

## 2019-12-31 DIAGNOSIS — M25561 Pain in right knee: Secondary | ICD-10-CM | POA: Diagnosis not present

## 2020-01-02 DIAGNOSIS — M25561 Pain in right knee: Secondary | ICD-10-CM | POA: Diagnosis not present

## 2020-01-05 DIAGNOSIS — M25561 Pain in right knee: Secondary | ICD-10-CM | POA: Diagnosis not present

## 2020-01-09 DIAGNOSIS — M25561 Pain in right knee: Secondary | ICD-10-CM | POA: Diagnosis not present

## 2020-01-12 DIAGNOSIS — M25561 Pain in right knee: Secondary | ICD-10-CM | POA: Diagnosis not present

## 2020-01-14 DIAGNOSIS — M25561 Pain in right knee: Secondary | ICD-10-CM | POA: Diagnosis not present

## 2020-02-11 DIAGNOSIS — M79659 Pain in unspecified thigh: Secondary | ICD-10-CM | POA: Diagnosis not present

## 2020-02-12 DIAGNOSIS — M25511 Pain in right shoulder: Secondary | ICD-10-CM | POA: Diagnosis not present

## 2020-02-18 DIAGNOSIS — Z96642 Presence of left artificial hip joint: Secondary | ICD-10-CM | POA: Diagnosis not present

## 2020-02-18 DIAGNOSIS — M1712 Unilateral primary osteoarthritis, left knee: Secondary | ICD-10-CM | POA: Diagnosis not present

## 2020-03-01 DIAGNOSIS — M79604 Pain in right leg: Secondary | ICD-10-CM | POA: Diagnosis not present

## 2020-03-01 DIAGNOSIS — M549 Dorsalgia, unspecified: Secondary | ICD-10-CM | POA: Diagnosis not present

## 2020-03-11 DIAGNOSIS — Z96642 Presence of left artificial hip joint: Secondary | ICD-10-CM | POA: Diagnosis not present

## 2020-03-11 DIAGNOSIS — M5459 Other low back pain: Secondary | ICD-10-CM | POA: Diagnosis not present

## 2020-03-17 ENCOUNTER — Ambulatory Visit: Payer: Medicare Other | Admitting: Physician Assistant

## 2020-04-02 DIAGNOSIS — M5459 Other low back pain: Secondary | ICD-10-CM | POA: Diagnosis not present

## 2020-04-07 DIAGNOSIS — R0602 Shortness of breath: Secondary | ICD-10-CM | POA: Insufficient documentation

## 2020-04-07 NOTE — Progress Notes (Signed)
Cardiology Office Note   Date:  04/08/2020   ID:  Kerri [name], DOB 11/10/1929, MRN [id number]  PCP:  Kerri Orn, MD  Cardiologist:   Kerri Breeding, MD   Chief Complaint  Patient presents with  . Leg Pain      History of Present Illness: Kerri [name] is a 86 y.o. female who presents for ongoing assessment and management of hypertension, palpitations, with other history to include cluster headaches.  She was seen in the emergency room on 12/06/2018 for migraine and dizziness.  She was noted to be hypokalemic with a potassium of 2.7, she was also found to be positive for UTI, EKG revealed sinus rhythm with frequent PVCs.  She was given potassium replacement, started on Keflex 500 mg, and also given steroid taper for presumptive temporal arteritis.    Since I last saw her she has no new cardiovascular complaints. She denies any chest pressure, neck or arm discomfort. She has had no palpitations, presyncope or syncope. She is convinced she has cancer because she is having lots of loose bowel movements. Her biggest issue has been pain below her knees and problems with her balance and gait. She walks with a cane. She is not able to do as much exercising as she was.   Past Medical History:  Diagnosis Date  . Adenomatous colon polyp   . Asthma   . Basal cell carcinoma 03/13/2017   left forehead - CX3 + cautery + 5FU  . CAD (coronary artery disease)    Chest CTA 10/07: 40% or less pLAD;  ETT 8/09: negative  . Carotid bruit   . Chronic back pain   . Depression   . Diverticulosis   . DJD (degenerative joint disease)   . Dyslipidemia   . Dysrhythmia    pvcs  . Fatty liver   . GERD (gastroesophageal reflux disease)   . HTN (hypertension)   . Hypothyroidism   . IBS (irritable bowel syndrome)   . Internal hemorrhoid   . Melanoma ([place])    metastatic; left leg; s/p multiple excisions  . Migraine   . Osteoporosis   . Spinal stenosis   . Squamous cell carcinoma of skin 09/02/2015    mid chest - CX3 + 5FU  . Squamous cell carcinoma of skin 11/07/2017   left wrist - tx p bx  . Squamous cell carcinoma of skin 12/11/2017   left hand - tx p bx    Past Surgical History:  Procedure Laterality Date  . BACK SURGERY    . BLEPHAROPLASTY    . BUNIONECTOMY WITH HAMMERTOE RECONSTRUCTION Right 01/16/2013   Procedure: RIGHT Barbie Banner OSTEOTOMY WITH SIMPLE BUNIONECTOMY AND SECOND HAMMER TOE CORRECTION;  Surgeon: Wylene Simmer, MD;  Location: [facility];  Service: Orthopedics;  Laterality: Right;  . CARPAL TUNNEL RELEASE    . KNEE ARTHROSCOPY Right   . LUMBAR LAMINECTOMY/DECOMPRESSION MICRODISCECTOMY N/A 11/13/2013   Procedure: LUMBAR TWO-THREE,LUMBAR THREE-FOUR,LUMBAR FOUR-FIVE LAMINECTOMY AND FORAMINOTOMY;  Surgeon: Ophelia Charter, MD;  Location: [facility];  Service: Neurosurgery;  Laterality: N/A;  . MELANOMA EXCISION Left 09/19/2012   Procedure: WIDE EXCISION METASTATIC MELANOMA LEFT MEDIAL THIGH;  Surgeon: Zenovia Jarred, MD;  Location: [facility];  Service: General;  Laterality: Left;  . ROTATOR CUFF REPAIR    . SHOULDER SURGERY    . TOE SURGERY    . TOTAL HIP ARTHROPLASTY  09/04/2011   Procedure: TOTAL HIP ARTHROPLASTY;  Surgeon: Gearlean Alf, MD;  Location: [facility];  Service: Orthopedics;  Laterality: Left;  . TOTAL HIP REVISION Left 11/27/2018   Procedure: Left hip femoral revision;  Surgeon: Gaynelle Arabian, MD;  Location: [facility];  Service: Orthopedics;  Laterality: Left;  146min  . TOTAL KNEE ARTHROPLASTY Right 10/06/2019   Procedure: TOTAL KNEE ARTHROPLASTY;  Surgeon: Gaynelle Arabian, MD;  Location: [facility];  Service: Orthopedics;  Laterality: Right;  46min     Current Outpatient Medications  Medication Sig Dispense Refill  . acetaminophen (TYLENOL) 500 MG tablet Take 500 mg by mouth daily.    Marland Kitchen alendronate (FOSAMAX) 70 MG tablet Take 70 mg by mouth every 7 (seven) days.    . Biotin 5 MG CAPS Take 5 mg by mouth daily.    . calcium carbonate  (OS-CAL) 1250 (500 Ca) MG chewable tablet Chew 2 tablets by mouth daily.     . chlorthalidone (HYGROTON) 25 MG tablet Take 1 tablet (25 mg total) by mouth daily. Only takes if bp over 646 systolic (Patient taking differently: Take 25 mg by mouth daily as needed. Only takes if bp over 803 systolic) 90 tablet 2  . Cholecalciferol (VITAMIN D3) 50 MCG (2000 UT) TABS Take 2,000 Units by mouth daily.    Marland Kitchen estradiol (ESTRACE) 0.1 MG/GM vaginal cream Place 1 Applicatorful vaginally every other day.     . Eyelid Cleansers (AVENOVA) 0.01 % SOLN Apply topically.    . fluticasone (FLONASE) 50 MCG/ACT nasal spray Place 2 sprays into both nostrils every morning.     . gabapentin (NEURONTIN) 300 MG capsule Take 300 mg by mouth 2 (two) times daily.     . hydrochlorothiazide (MICROZIDE) 12.5 MG capsule Take by mouth.    . levothyroxine (SYNTHROID, LEVOTHROID) 75 MCG tablet Take 75 mcg by mouth daily with breakfast.    . loratadine (CLARITIN) 10 MG tablet Take 10 mg by mouth daily.     . Magnesium (V-R MAGNESIUM) 250 MG TABS Take 250 mg by mouth daily.     . methocarbamol (ROBAXIN) 500 MG tablet Take 1 tablet (500 mg total) by mouth every 6 (six) hours as needed for muscle spasms. 40 tablet 0  . naproxen sodium (ALEVE) 220 MG tablet Take by mouth.    . neomycin-polymyxin b-dexamethasone (MAXITROL) 3.5-10000-0.1 OINT USE A SMALL AMOUNT ON THE AFFECTED AREA TWICE DAILY FOLLOWING HOT COMPRESSES    . polyethylene glycol powder (MIRALAX) 17 GM/SCOOP powder Take 8.5 g by mouth daily.     . potassium chloride SA (KLOR-CON) 20 MEQ tablet Take 20 mEq by mouth daily as needed (takes with HCTZ).     . Probiotic Product (PROBIOTIC PO) Take 1 capsule by mouth daily.    . rosuvastatin (CRESTOR) 10 MG tablet Take 10 mg by mouth at bedtime.     . tapentadol (NUCYNTA) 50 MG tablet Take 1-2 tablets (50-100 mg total) by mouth every 6 (six) hours as needed for moderate pain or severe pain. 56 tablet 0  . tretinoin (RETIN-A) 0.05 %  cream Apply 1 application topically every other day.     . trimethoprim (TRIMPEX) 100 MG tablet Take 100 mg by mouth daily. UTI prevention    . vitamin B-12 (CYANOCOBALAMIN) 500 MCG tablet Take 500 mcg by mouth daily.     No current facility-administered medications for this visit.    Allergies:   Buprenorphine hcl, Codeine, Morphine and related, Tramadol, Adhesive [tape], Hydrocodone-acetaminophen, Lactose intolerance (gi), and Oxycodone    ROS:  Please see the history of present illness.  Otherwise, review of systems are positive for none.   All other systems are reviewed and negative.    PHYSICAL EXAM: VS:  BP 130/74   Pulse 72   Temp 97.9 F (36.6 C)   Ht 5\' 5"  (1.651 m)   Wt 124 lb (56.2 kg)   SpO2 97%   BMI 20.63 kg/m  , BMI Body mass index is 20.63 kg/m. GENERAL:  Well appearing NECK:  No jugular venous distention, waveform within normal limits, carotid upstroke brisk and symmetric, no bruits, no thyromegaly LUNGS:  Clear to auscultation bilaterally CHEST:  Unremarkable HEART:  PMI not displaced or sustained,S1 and S2 within normal limits, no S3, no S4, no clicks, no rubs, no murmurs ABD:  Flat, positive bowel sounds normal in frequency in pitch, no bruits, no rebound, no guarding, no midline pulsatile mass, no hepatomegaly, no splenomegaly EXT:  2 plus pulses throughout, no edema, no cyanosis no clubbing   EKG:  EKG is ordered today. Sinus rhythm, rate 72, axis within normal limits, intervals within normal limits, premature ectopic complexes.  Recent Labs: 10/13/2019: ALT 13; BUN 14; Creatinine, Ser 0.62; Hemoglobin 8.9; Platelets 311; Potassium 3.6; Sodium 138    Lipid Panel No results found for: CHOL, TRIG, HDL, CHOLHDL, VLDL, LDLCALC, LDLDIRECT    Wt Readings from Last 3 Encounters:  04/08/20 124 lb (56.2 kg)  10/13/19 134 lb 14.7 oz (61.2 kg)  10/06/19 135 lb (61.2 kg)      Other studies Reviewed: Additional studies/ records that were reviewed today  include: None. Review of the above records demonstrates:  Please see elsewhere in the note.     ASSESSMENT AND PLAN:  Dyspnea on exertion:   She is optically complaining of this because she is not as active. No further work-up.  Hypertension:    Her blood pressure is well controlled. No change in therapy.  Dyslipidemia: I would like for her to get a lipid profile by her primary provider the next time she has blood work drawn.  Ectopy. She does not feel any palpitations. No change in therapy.  Current medicines are reviewed at length with the patient today.  The patient does not have concerns regarding medicines.  The following changes have been made:  no change  Labs/ tests ordered today include: None  Orders Placed This Encounter  Procedures  . EKG 12-Lead     Disposition:   FU with me in one year.     Signed, Kerri Breeding, MD  04/08/2020 5:20 PM    Kerri [name]      Cardiology Office Note   Date:  04/08/2020   ID:  Kerri [name], DOB [birth year], MRN [id number]  PCP:  Kerri Orn, MD  Cardiologist:   Kerri Breeding, MD   Chief Complaint  Patient presents with  . Leg Pain      History of Present Illness: Loula Marcella is a 86 y.o. female who presents for ongoing assessment and management of hypertension, palpitations, with other history to include cluster headaches.  She was seen in the emergency room on 12/06/2018 for migraine and dizziness.  She was noted to be hypokalemic with a potassium of 2.7, she was also found to be positive for UTI, EKG revealed sinus rhythm with frequent PVCs.  She was given potassium replacement, started on Keflex 500 mg, and also given steroid taper for presumptive temporal arteritis.    Since I last saw her she complains of heartburn.  This happens when she lies down  at night.  Does not happen when she is physically active like walking [phone] steps in the store.  She complains about multiple aches and pains.  She  is having recurrent urinary tract infections.  Her blood pressure seems to be reasonably controlled.  She is not noticing any palpitations however.  She has no acute cardiac complaints.    Past Medical History:  Diagnosis Date  . Adenomatous colon polyp   . Asthma   . Basal cell carcinoma 03/13/2017   left forehead - CX3 + cautery + 5FU  . CAD (coronary artery disease)    Chest CTA 10/07: 40% or less pLAD;  ETT 8/09: negative  . Carotid bruit   . Chronic back pain   . Depression   . Diverticulosis   . DJD (degenerative joint disease)   . Dyslipidemia   . Dysrhythmia    pvcs  . Fatty liver   . GERD (gastroesophageal reflux disease)   . HTN (hypertension)   . Hypothyroidism   . IBS (irritable bowel syndrome)   . Internal hemorrhoid   . Melanoma ([place])    metastatic; left leg; s/p multiple excisions  . Migraine   . Osteoporosis   . Spinal stenosis   . Squamous cell carcinoma of skin 09/02/2015   mid chest - CX3 + 5FU  . Squamous cell carcinoma of skin 11/07/2017   left wrist - tx p bx  . Squamous cell carcinoma of skin 12/11/2017   left hand - tx p bx    Past Surgical History:  Procedure Laterality Date  . BACK SURGERY    . BLEPHAROPLASTY    . BUNIONECTOMY WITH HAMMERTOE RECONSTRUCTION Right 01/16/2013   Procedure: RIGHT Barbie Banner OSTEOTOMY WITH SIMPLE BUNIONECTOMY AND SECOND HAMMER TOE CORRECTION;  Surgeon: Wylene Simmer, MD;  Location: [facility];  Service: Orthopedics;  Laterality: Right;  . CARPAL TUNNEL RELEASE    . KNEE ARTHROSCOPY Right   . LUMBAR LAMINECTOMY/DECOMPRESSION MICRODISCECTOMY N/A 11/13/2013   Procedure: LUMBAR TWO-THREE,LUMBAR THREE-FOUR,LUMBAR FOUR-FIVE LAMINECTOMY AND FORAMINOTOMY;  Surgeon: Ophelia Charter, MD;  Location: [facility];  Service: Neurosurgery;  Laterality: N/A;  . MELANOMA EXCISION Left 09/19/2012   Procedure: WIDE EXCISION METASTATIC MELANOMA LEFT MEDIAL THIGH;  Surgeon: Zenovia Jarred, MD;  Location: [facility];  Service: General;  Laterality: Left;  . ROTATOR CUFF REPAIR    . SHOULDER SURGERY    . TOE SURGERY    . TOTAL HIP ARTHROPLASTY  09/04/2011   Procedure: TOTAL HIP ARTHROPLASTY;  Surgeon: Gearlean Alf, MD;  [facility];  Service: Orthopedics;  Laterality: Left;  . TOTAL HIP REVISION Left 11/27/2018   Procedure: Left hip femoral revision;  Surgeon: Gaynelle Arabian, MD;  [facility];  Service: Orthopedics;  Laterality: Left;  152min  . TOTAL KNEE ARTHROPLASTY Right 10/06/2019   Procedure: TOTAL KNEE ARTHROPLASTY;  Surgeon: Gaynelle Arabian, MD;  [facility];  Service: Orthopedics;  Laterality: Right;  61min     Current Outpatient Medications  Medication Sig Dispense Refill  . acetaminophen (TYLENOL) 500 MG tablet Take 500 mg by mouth daily.    Marland Kitchen alendronate (FOSAMAX) 70 MG tablet Take 70 mg by mouth every 7 (seven) days.    . Biotin 5 MG CAPS Take 5 mg by mouth daily.    . calcium carbonate (OS-CAL) 1250 (500 Ca) MG chewable tablet Chew 2 tablets by mouth daily.     . chlorthalidone (HYGROTON) 25 MG tablet Take 1 tablet (25 mg  total) by mouth daily. Only takes if bp over 370 systolic (Patient taking differently: Take 25 mg by mouth daily as needed. Only takes if bp over 488 systolic) 90 tablet 2  . Cholecalciferol (VITAMIN D3) 50 MCG (2000 UT) TABS Take 2,000 Units by mouth daily.    Marland Kitchen estradiol (ESTRACE) 0.1 MG/GM vaginal cream Place 1 Applicatorful vaginally every other day.     . Eyelid Cleansers (AVENOVA) 0.01 % SOLN Apply topically.    . fluticasone (FLONASE) 50 MCG/ACT nasal spray Place 2 sprays into both nostrils every morning.     . gabapentin (NEURONTIN) 300 MG capsule Take 300 mg by mouth 2 (two) times daily.     . hydrochlorothiazide (MICROZIDE) 12.5 MG capsule Take by mouth.    . levothyroxine (SYNTHROID, LEVOTHROID) 75 MCG tablet Take 75 mcg by mouth daily with breakfast.    . loratadine (CLARITIN) 10 MG tablet Take 10 mg by mouth daily.     . Magnesium (V-R  MAGNESIUM) 250 MG TABS Take 250 mg by mouth daily.     . methocarbamol (ROBAXIN) 500 MG tablet Take 1 tablet (500 mg total) by mouth every 6 (six) hours as needed for muscle spasms. 40 tablet 0  . naproxen sodium (ALEVE) 220 MG tablet Take by mouth.    . neomycin-polymyxin b-dexamethasone (MAXITROL) 3.5-10000-0.1 OINT USE A SMALL AMOUNT ON THE AFFECTED AREA TWICE DAILY FOLLOWING HOT COMPRESSES    . polyethylene glycol powder (MIRALAX) 17 GM/SCOOP powder Take 8.5 g by mouth daily.     . potassium chloride SA (KLOR-CON) 20 MEQ tablet Take 20 mEq by mouth daily as needed (takes with HCTZ).     . Probiotic Product (PROBIOTIC PO) Take 1 capsule by mouth daily.    . rosuvastatin (CRESTOR) 10 MG tablet Take 10 mg by mouth at bedtime.     . tapentadol (NUCYNTA) 50 MG tablet Take 1-2 tablets (50-100 mg total) by mouth every 6 (six) hours as needed for moderate pain or severe pain. 56 tablet 0  . tretinoin (RETIN-A) 0.05 % cream Apply 1 application topically every other day.     . trimethoprim (TRIMPEX) 100 MG tablet Take 100 mg by mouth daily. UTI prevention    . vitamin B-12 (CYANOCOBALAMIN) 500 MCG tablet Take 500 mcg by mouth daily.     No current facility-administered medications for this visit.    Allergies:   Buprenorphine hcl, Codeine, Morphine and related, Tramadol, Adhesive [tape], Hydrocodone-acetaminophen, Lactose intolerance (gi), and Oxycodone    ROS:  Please see the history of present illness.   Otherwise, review of systems are positive for none.   All other systems are reviewed and negative.    PHYSICAL EXAM: VS:  BP 130/74   Pulse 72   Temp 97.9 F (36.6 C)   Ht 5\' 5"  (1.651 m)   Wt 124 lb (56.2 kg)   SpO2 97%   BMI 20.63 kg/m  , BMI Body mass index is 20.63 kg/m. GENERAL:  Well appearing NECK:  No jugular venous distention, waveform within normal limits, carotid upstroke brisk and symmetric, no bruits, no thyromegaly LUNGS:  Clear to auscultation bilaterally CHEST:   Unremarkable HEART:  PMI not displaced or sustained,S1 and S2 within normal limits, no S3, no S4, no clicks, no rubs, no murmurs ABD:  Flat, positive bowel sounds normal in frequency in pitch, no bruits, no rebound, no guarding, no midline pulsatile mass, no hepatomegaly, no splenomegaly EXT:  2 plus pulses throughout, no edema, no cyanosis no  clubbing   EKG:  EKG is not ordered today.   Recent Labs: 10/13/2019: ALT 13; BUN 14; Creatinine, Ser 0.62; Hemoglobin 8.9; Platelets 311; Potassium 3.6; Sodium 138    Lipid Panel No results found for: CHOL, TRIG, HDL, CHOLHDL, VLDL, LDLCALC, LDLDIRECT    Wt Readings from Last 3 Encounters:  04/08/20 124 lb (56.2 kg)  10/13/19 134 lb 14.7 oz (61.2 kg)  10/06/19 135 lb (61.2 kg)      Other studies Reviewed: Additional studies/ records that were reviewed today include: None. Review of the above records demonstrates:  Please see elsewhere in the note.     ASSESSMENT AND PLAN:  Dyspnea on exertion:     She is not reporting this currently.  No change in therapy.  No further work-up.  I have encouraged physical exercise we talked about this specifically.   Hypertension:    Her blood pressure is currently well controlled.  No change in therapy.    Current medicines are reviewed at length with the patient today.  The patient does not have concerns regarding medicines.  The following changes have been made:  no change  Labs/ tests ordered today include: None  Orders Placed This Encounter  Procedures  . EKG 12-Lead     Disposition:   FU with me in one year.     Signed, Kerri Breeding, MD  04/08/2020 5:20 PM    [facility]

## 2020-04-08 ENCOUNTER — Other Ambulatory Visit: Payer: Self-pay

## 2020-04-08 ENCOUNTER — Encounter: Payer: Self-pay | Admitting: Cardiology

## 2020-04-08 ENCOUNTER — Ambulatory Visit: Payer: Medicare Other | Admitting: Cardiology

## 2020-04-08 VITALS — BP 130/74 | HR 72 | Temp 97.9°F | Ht 65.0 in | Wt 124.0 lb

## 2020-04-08 DIAGNOSIS — I10 Essential (primary) hypertension: Secondary | ICD-10-CM | POA: Diagnosis not present

## 2020-04-08 DIAGNOSIS — R0602 Shortness of breath: Secondary | ICD-10-CM

## 2020-04-08 DIAGNOSIS — I2510 Atherosclerotic heart disease of native coronary artery without angina pectoris: Secondary | ICD-10-CM | POA: Diagnosis not present

## 2020-04-08 DIAGNOSIS — K589 Irritable bowel syndrome without diarrhea: Secondary | ICD-10-CM | POA: Diagnosis not present

## 2020-04-08 DIAGNOSIS — R350 Frequency of micturition: Secondary | ICD-10-CM | POA: Diagnosis not present

## 2020-04-08 DIAGNOSIS — R2689 Other abnormalities of gait and mobility: Secondary | ICD-10-CM | POA: Diagnosis not present

## 2020-04-08 NOTE — Patient Instructions (Signed)
Medication Instructions:  No changes *If you need a refill on your cardiac medications before your next appointment, please call your pharmacy*  Lab Work: Please have primary care physician draw a Lipid profile and fax to [phone] when you're seen next.  Testing/Procedures: None ordered this visit  Follow-Up: At [facility], you and your health needs are our priority.  As part of our continuing mission to provide you with exceptional heart care, we have created designated Provider Care Teams.  These Care Teams include your primary Cardiologist (physician) and Advanced Practice Providers (APPs -  Physician Assistants and Nurse Practitioners) who all work together to provide you with the care you need, when you need it.  We recommend signing up for the patient portal called "MyChart".  Sign up information is provided on this After Visit Summary.  MyChart is used to connect with patients for Virtual Visits (Telemedicine).  Patients are able to view lab/test results, encounter notes, upcoming appointments, etc.  Non-urgent messages can be sent to your provider as well.   To learn more about what you can do with MyChart, go to [email or web address].    Your next appointment:   12 month(s)  The format for your next appointment:   In Person  Provider:   Minus Breeding, MD

## 2020-04-19 DIAGNOSIS — R350 Frequency of micturition: Secondary | ICD-10-CM | POA: Diagnosis not present

## 2020-04-29 DIAGNOSIS — R200 Anesthesia of skin: Secondary | ICD-10-CM | POA: Diagnosis not present

## 2020-04-30 DIAGNOSIS — M48061 Spinal stenosis, lumbar region without neurogenic claudication: Secondary | ICD-10-CM | POA: Diagnosis not present

## 2020-04-30 DIAGNOSIS — M5136 Other intervertebral disc degeneration, lumbar region: Secondary | ICD-10-CM | POA: Diagnosis not present

## 2020-05-06 DIAGNOSIS — M961 Postlaminectomy syndrome, not elsewhere classified: Secondary | ICD-10-CM | POA: Diagnosis not present

## 2020-05-27 DIAGNOSIS — M5416 Radiculopathy, lumbar region: Secondary | ICD-10-CM | POA: Diagnosis not present

## 2020-05-27 DIAGNOSIS — M5136 Other intervertebral disc degeneration, lumbar region: Secondary | ICD-10-CM | POA: Diagnosis not present

## 2020-06-03 DIAGNOSIS — Z0000 Encounter for general adult medical examination without abnormal findings: Secondary | ICD-10-CM | POA: Diagnosis not present

## 2020-06-03 DIAGNOSIS — M48061 Spinal stenosis, lumbar region without neurogenic claudication: Secondary | ICD-10-CM | POA: Diagnosis not present

## 2020-06-03 DIAGNOSIS — I10 Essential (primary) hypertension: Secondary | ICD-10-CM | POA: Diagnosis not present

## 2020-06-03 DIAGNOSIS — R197 Diarrhea, unspecified: Secondary | ICD-10-CM | POA: Diagnosis not present

## 2020-06-16 ENCOUNTER — Other Ambulatory Visit: Payer: Self-pay

## 2020-06-16 ENCOUNTER — Ambulatory Visit: Payer: Medicare Other | Admitting: Physician Assistant

## 2020-06-16 ENCOUNTER — Encounter: Payer: Self-pay | Admitting: Physician Assistant

## 2020-06-16 DIAGNOSIS — L570 Actinic keratosis: Secondary | ICD-10-CM | POA: Diagnosis not present

## 2020-06-16 DIAGNOSIS — Z85820 Personal history of malignant melanoma of skin: Secondary | ICD-10-CM

## 2020-06-16 DIAGNOSIS — Z1283 Encounter for screening for malignant neoplasm of skin: Secondary | ICD-10-CM | POA: Diagnosis not present

## 2020-06-16 NOTE — Progress Notes (Signed)
   Follow-Up Visit   Subjective  Kerri [name] is a 87 y.o. female who presents for the following: Annual Exam (No new concerns).   The following portions of the chart were reviewed this encounter and updated as appropriate:  Tobacco  Allergies  Meds  Problems  Med Hx  Surg Hx  Fam Hx      Objective  Well appearing patient in no apparent distress; mood and affect are within normal limits.  A focused examination was performed including waist up and legs. Relevant physical exam findings are noted in the Assessment and Plan.  Objective  Left Breast, Left Lower Back, Left Upper Back, Neck - Posterior, Right Upper Back: Erythematous patches with gritty scale.  Objective  waist up: No atypical nevi No signs of non-mole skin cancer.   Objective  Left Thigh - lateral: Dyspigmented scar.    Assessment & Plan  AK (actinic keratosis) (5) Neck - Posterior; Left Breast; Left Upper Back; Right Upper Back; Left Lower Back  Destruction of lesion - Left Breast, Left Lower Back, Left Upper Back, Neck - Posterior, Right Upper Back Complexity: simple   Destruction method: cryotherapy   Informed consent: discussed and consent obtained   Timeout:  patient name, date of birth, surgical site, and procedure verified Lesion destroyed using liquid nitrogen: Yes   Cryotherapy cycles:  1 Outcome: patient tolerated procedure well with no complications   Post-procedure details: wound care instructions given    Screening exam for skin cancer waist up  3 month skin exams  Personal history of malignant melanoma of skin Left Thigh - lateral  observe    I, Jianna Drabik, PA-C, have reviewed all documentation's for this visit.  The documentation on 06/16/20 for the exam, diagnosis, procedures and orders are all accurate and complete.

## 2020-06-17 ENCOUNTER — Ambulatory Visit: Payer: Medicare Other | Admitting: Physician Assistant

## 2020-06-21 ENCOUNTER — Ambulatory Visit (INDEPENDENT_AMBULATORY_CARE_PROVIDER_SITE_OTHER): Payer: Medicare Other | Admitting: Physician Assistant

## 2020-06-21 ENCOUNTER — Encounter: Payer: Self-pay | Admitting: Physician Assistant

## 2020-06-21 ENCOUNTER — Other Ambulatory Visit (INDEPENDENT_AMBULATORY_CARE_PROVIDER_SITE_OTHER): Payer: Medicare Other

## 2020-06-21 VITALS — BP 102/60 | HR 78 | Ht 65.0 in | Wt 125.0 lb

## 2020-06-21 DIAGNOSIS — R1084 Generalized abdominal pain: Secondary | ICD-10-CM | POA: Diagnosis not present

## 2020-06-21 DIAGNOSIS — K5900 Constipation, unspecified: Secondary | ICD-10-CM

## 2020-06-21 DIAGNOSIS — R194 Change in bowel habit: Secondary | ICD-10-CM

## 2020-06-21 LAB — CBC WITH DIFFERENTIAL/PLATELET
Basophils Absolute: 0 10*3/uL (ref 0.0–0.1)
Basophils Relative: 0.6 % (ref 0.0–3.0)
Eosinophils Absolute: 0.1 10*3/uL (ref 0.0–0.7)
Eosinophils Relative: 1.6 % (ref 0.0–5.0)
HCT: 36.6 % (ref 36.0–46.0)
Hemoglobin: 12.2 g/dL (ref 12.0–15.0)
Lymphocytes Relative: 29.6 % (ref 12.0–46.0)
Lymphs Abs: 1.8 10*3/uL (ref 0.7–4.0)
MCHC: 33.3 g/dL (ref 30.0–36.0)
MCV: 92.7 fl (ref 78.0–100.0)
Monocytes Absolute: 0.5 10*3/uL (ref 0.1–1.0)
Monocytes Relative: 8.8 % (ref 3.0–12.0)
Neutro Abs: 3.7 10*3/uL (ref 1.4–7.7)
Neutrophils Relative %: 59.4 % (ref 43.0–77.0)
Platelets: 222 10*3/uL (ref 150.0–400.0)
RBC: 3.95 Mil/uL (ref 3.87–5.11)
RDW: 13.9 % (ref 11.5–15.5)
WBC: 6.3 10*3/uL (ref 4.0–10.5)

## 2020-06-21 LAB — COMPREHENSIVE METABOLIC PANEL
ALT: 8 U/L (ref 0–35)
AST: 13 U/L (ref 0–37)
Albumin: 4.2 g/dL (ref 3.5–5.2)
Alkaline Phosphatase: 95 U/L (ref 39–117)
BUN: 12 mg/dL (ref 6–23)
CO2: 29 mEq/L (ref 19–32)
Calcium: 9.5 mg/dL (ref 8.4–10.5)
Chloride: 104 mEq/L (ref 96–112)
Creatinine, Ser: 0.84 mg/dL (ref 0.40–1.20)
GFR: 61.94 mL/min (ref >60.00)
Glucose, Bld: 101 mg/dL — ABNORMAL HIGH (ref 70–99)
Potassium: 4 mEq/L (ref 3.5–5.1)
Sodium: 138 mEq/L (ref 135–145)
Total Bilirubin: 0.3 mg/dL (ref 0.2–1.2)
Total Protein: 6.8 g/dL (ref 6.0–8.3)

## 2020-06-21 NOTE — Progress Notes (Signed)
Subjective:    Patient ID: Kerri [name], female    DOB: March 18, 1930, 87 y.o.   MRN: [id number]  HPI Kerri [name] is a pleasant 87 year old white female, established with Dr. Hilarie Fredrickson, who was last seen in the office in June 2019 by myself.  She comes in today with complaints of progressive constipation and achy abdominal discomfort. She is quite frustrated by inconsistency with her bowel habits.  She describes lifelong constipation.  Over the past several weeks she has been having episodes of constipation followed by episodes of watery stools which occur after she takes even 1/2-1/4 dose of MiraLAX.  She says she will go for for 5 days without a bowel movement, takes a very small amount of MiraLAX and this may produce watery stool over the next couple of days but without passage of any pieces of stool or formed stool, then she goes right back to not having a bowel movement for for 5 days.  She gets uncomfortable with this and feels that she is obstipated.  She has not been on any new medications or had any change in diet etc.  Again she is a vague achy abdominal discomfort across the mid abdomen.  She is concerned about cancer.  Appetite has been fine, weight overall has been stable.  She tries to eat a lot of fiber.  She has not noted any melena or hematochezia.  No complaints of nausea or vomiting. Last colonoscopy was done in 2012 with removal of one 5 mm adenomatous colon polyp and she does have prior history of adenomatous colon polyps as well.  She has been having a lot of orthopedic issues recently had a failed left hip replacement, and is having trouble with back and leg pain, history of spinal stenosis osteoarthritis, coronary artery disease and hypertension. No recent abdominal imaging or labs  Review of Systems Pertinent positive and negative review of systems were noted in the above HPI section.  All other review of systems was otherwise negative.  Outpatient Encounter Medications as of 06/21/2020   Medication Sig  . acetaminophen (TYLENOL) 500 MG tablet Take 500 mg by mouth daily.  Marland Kitchen alendronate (FOSAMAX) 70 MG tablet Take 70 mg by mouth every 7 (seven) days.  . Biotin 5 MG CAPS Take 5 mg by mouth daily.  . calcium carbonate (OS-CAL) 1250 (500 Ca) MG chewable tablet Chew 2 tablets by mouth daily.   . chlorthalidone (HYGROTON) 25 MG tablet Take 1 tablet (25 mg total) by mouth daily. Only takes if bp over 024 systolic (Patient taking differently: Take 25 mg by mouth daily as needed. Only takes if bp over 097 systolic)  . Cholecalciferol (VITAMIN D3) 50 MCG (2000 UT) TABS Take 2,000 Units by mouth daily.  Marland Kitchen estradiol (ESTRACE) 0.1 MG/GM vaginal cream Place 1 Applicatorful vaginally every other day.   . Eyelid Cleansers (AVENOVA) 0.01 % SOLN Apply topically.  . fluticasone (FLONASE) 50 MCG/ACT nasal spray Place 2 sprays into both nostrils every morning.   . gabapentin (NEURONTIN) 300 MG capsule Take 300 mg by mouth 2 (two) times daily.   Marland Kitchen levothyroxine (SYNTHROID, LEVOTHROID) 75 MCG tablet Take 75 mcg by mouth daily with breakfast.  . loratadine (CLARITIN) 10 MG tablet Take 10 mg by mouth daily.  . naproxen sodium (ALEVE) 220 MG tablet Take by mouth.  . neomycin-polymyxin b-dexamethasone (MAXITROL) 3.5-10000-0.1 OINT USE A SMALL AMOUNT ON THE AFFECTED AREA TWICE DAILY FOLLOWING HOT COMPRESSES  . polyethylene glycol powder (GLYCOLAX/MIRALAX) 17 GM/SCOOP powder Take 8.5 g  by mouth daily.   . potassium chloride SA (KLOR-CON) 20 MEQ tablet Take 20 mEq by mouth daily as needed (takes with HCTZ).   . Probiotic Product (PROBIOTIC PO) Take 1 capsule by mouth daily.  . rosuvastatin (CRESTOR) 10 MG tablet Take 10 mg by mouth at bedtime.  . tretinoin (RETIN-A) 0.05 % cream Apply 1 application topically every other day.   . trimethoprim (TRIMPEX) 100 MG tablet Take 100 mg by mouth daily. UTI prevention  . vitamin B-12 (CYANOCOBALAMIN) 500 MCG tablet Take 500 mcg by mouth daily.  . [DISCONTINUED]  hydrochlorothiazide (MICROZIDE) 12.5 MG capsule Take by mouth. (Patient not taking: Reported on 06/21/2020)  . [DISCONTINUED] Magnesium 250 MG TABS Take 250 mg by mouth daily.  (Patient not taking: Reported on 06/21/2020)  . [DISCONTINUED] methocarbamol (ROBAXIN) 500 MG tablet Take 1 tablet (500 mg total) by mouth every 6 (six) hours as needed for muscle spasms. (Patient not taking: Reported on 06/21/2020)  . [DISCONTINUED] tapentadol (NUCYNTA) 50 MG tablet Take 1-2 tablets (50-100 mg total) by mouth every 6 (six) hours as needed for moderate pain or severe pain. (Patient not taking: Reported on 06/21/2020)   No facility-administered encounter medications on file as of 06/21/2020.   Allergies  Allergen Reactions  . Buprenorphine Hcl Itching and Other (See Comments)    Happened a while back. Patient noted that any narcotic based medication makes her have a bad reaction.  . Codeine Nausea And Vomiting and Other (See Comments)    Patient gets sick on stomach  . Morphine And Related Itching and Other (See Comments)    Happened a while back. Patient noted that any narcotic based medication makes her have a bad reaction.  . Tramadol Nausea And Vomiting and Other (See Comments)    Confusion Can tolerate one tablet at night  . Adhesive [Tape] Other (See Comments)    Tears skin   . Hydrocodone-Acetaminophen Nausea And Vomiting  . Lactose Intolerance (Gi) Other (See Comments)    Gi upset   . Oxycodone Nausea And Vomiting   Patient Active Problem List   Diagnosis Date Noted  . SOB (shortness of breath) 04/07/2020  . Primary osteoarthritis of right knee 10/06/2019  . Palpitations 03/29/2019  . Failed total hip arthroplasty ([place]) 11/27/2018  . Melanoma ([place])   . Dizziness 01/30/2017  . Lumbar stenosis with neurogenic claudication 11/13/2013  . Sebaceous cyst 11/20/2012  . Squamous cell carcinoma in situ of skin of calf 04/03/2012  . Postop Hypokalemia 09/08/2011  . Postop Acute blood loss anemia  09/05/2011  . Osteoarthritis of hip 09/04/2011  . Melanoma of lower leg ([place]) 05/03/2011  . PVC's (premature ventricular contractions) 07/30/2009  . HYPOTHYROIDISM 05/10/2009  . DYSLIPIDEMIA 05/10/2009  . Essential hypertension 05/10/2009  . CAD 05/10/2009  . OA (osteoarthritis) of knee 05/10/2009  . SPINAL STENOSIS 05/10/2009  . CAROTID BRUIT 05/10/2009   Social History   Socioeconomic History  . Marital status: Married    Spouse name: Not on file  . Number of children: 4  . Years of education: Not on file  . Highest education level: Not on file  Occupational History  . Occupation: retired  Tobacco Use  . Smoking status: Former Smoker    Types: Cigarettes    Quit date: 08/31/1948    Years since quitting: 71.8  . Smokeless tobacco: Never Used  Vaping Use  . Vaping Use: Never used  Substance and Sexual Activity  . Alcohol use: Yes    Alcohol/week: 2.0 standard  drinks    Types: 2 Glasses of wine per week  . Drug use: No  . Sexual activity: Never  Other Topics Concern  . Not on file  Social History Narrative  . Not on file   Social Determinants of Health   Financial Resource Strain: Not on file  Food Insecurity: Not on file  Transportation Needs: Not on file  Physical Activity: Not on file  Stress: Not on file  Social Connections: Not on file  Intimate Partner Violence: Not on file    Ms. Tangredi's family history includes CAD in her father and mother; Kidney disease in her child.      Objective:    Vitals:   06/21/20 1448  BP: 102/60  Pulse: 78    Physical Exam Well-developed well-nourished thin elderly white female ambulating with a cane in no acute distress.  Height, [id number], BMI  HEENT; nontraumatic normocephalic, EOMI, PE R LA, sclera anicteric. Oropharynx;not examined Neck; supple, no JVD Cardiovascular; regular rate and rhythm with S1-S2, no murmur rub or gallop Pulmonary; Clear bilaterally Abdomen; soft, mildly nondistended,BS hyperactive  no  palpable mass or hepatosplenomegaly, mild generalized tenderness, no focal tenderness Rectal; prolapsed hemorrhoid, no stool in the rectal vault or impaction  skin; benign exam, no jaundice rash or appreciable lesions Extremities; no clubbing cyanosis or edema skin warm and dry Neuro/Psych; alert and oriented x4, grossly nonfocal mood and affect appropriate       Assessment & Plan:   #44 87 year old white female with several week history of change in bowel habits with worsening constipation alternating with episodes of watery stool after use of very minimal amounts of MiraLAX.  Patient has vague mid abdominal discomfort/aching and sense of constipation. Rectal exam negative for today, no impaction  Is unclear today if her symptoms are secondary to constipation, versus low-grade partial obstruction, intra-abdominal inflammatory process, neoplasm. Interesting that she develops watery stool with a quarter dose of MiraLAX.  #2 coronary artery disease 3.  Hypertension 4.  History of remote adenomatous colon polyps-last colonoscopy 2012 5.  History of IBS 6.  Osteoarthritis 7.  Spinal stenosis  Plan; CBC with differential, c-Met, Schedule for CT scan of the abdomen pelvis with contrast Patient will start 1/2 teaspoon of MiraLAX daily in the interim until CT has been done and reviewed.  Further recommendations pending findings at CT.  Noble Bodie Genia Harold PA-C 06/21/2020   Cc: Lavone Orn, MD

## 2020-06-21 NOTE — Patient Instructions (Signed)
If you are age 85 or older, your body mass index should be between 23-30. Your Body mass index is 20.8 kg/m. If this is out of the aforementioned range listed, please consider follow up with your Primary Care Provider.  If you are age 23 or younger, your body mass index should be between 19-25. Your Body mass index is 20.8 kg/m. If this is out of the aformentioned range listed, please consider follow up with your Primary Care Provider.   You have been scheduled for a CT scan of the abdomen and pelvis at [facility], 1st floor Radiology. You are scheduled on 06/23/2020  at 9:00 am. You should arrive 15 minutes prior to your appointment time for registration.  Please pick up 2 bottles of contrast from [place] at least 3 days prior to your scan. The solution may taste better if refrigerated, but do NOT add ice or any other liquid to this solution. Shake well before drinking.   Please follow the written instructions below on the day of your exam:   1) Do not eat anything after 5:00 am (4 hours prior to your test)   2) Drink 1 bottle of contrast @ 7:00 am (2 hours prior to your exam)  Remember to shake well before drinking and do NOT pour over ice.     Drink 1 bottle of contrast @ 8:00 am (1 hour prior to your exam)   You may take any medications as prescribed with a small amount of water, if necessary. If you take any of the following medications: METFORMIN, GLUCOPHAGE, GLUCOVANCE, AVANDAMET, RIOMET, FORTAMET, [place] MET, JANUMET, GLUMETZA or METAGLIP, you MAY be asked to HOLD this medication 48 hours AFTER the exam.   The purpose of you drinking the oral contrast is to aid in the visualization of your intestinal tract. The contrast solution may cause some diarrhea. Depending on your individual set of symptoms, you may also receive an intravenous injection of x-ray contrast/dye. Plan on being at [facility] for 45 minutes or longer, depending on the type of exam you are having performed.    If you have any questions regarding your exam or if you need to reschedule, you may call [facility] at [phone] between the hours of 8:00 am and 5:00 pm, Monday-Friday.   Your provider has requested that you go to the basement level for lab work before leaving today. Press "B" on the elevator. The lab is located at the first door on the left as you exit the elevator.  Use half a teaspoon of Miralax daily.  We will call you with the results of your CT and decided of further plans for your treatment.  Thank you for entrusting me with your care and choosing [facility].  Amy Esterwood, PA-C

## 2020-06-23 ENCOUNTER — Ambulatory Visit (HOSPITAL_COMMUNITY): Payer: Medicare Other

## 2020-06-24 DIAGNOSIS — M5416 Radiculopathy, lumbar region: Secondary | ICD-10-CM | POA: Diagnosis not present

## 2020-06-24 DIAGNOSIS — M25562 Pain in left knee: Secondary | ICD-10-CM | POA: Diagnosis not present

## 2020-06-24 NOTE — Progress Notes (Signed)
Addendum: Reviewed and agree with assessment and management plan. Bonnie Roig M, MD  

## 2020-07-01 ENCOUNTER — Other Ambulatory Visit: Payer: Self-pay

## 2020-07-01 ENCOUNTER — Ambulatory Visit (HOSPITAL_COMMUNITY)
Admission: RE | Admit: 2020-07-01 | Discharge: 2020-07-01 | Disposition: A | Discharge status: Home / Self Care | Payer: Medicare Other | Source: Ambulatory Visit | Attending: Physician Assistant | Admitting: Physician Assistant

## 2020-07-01 DIAGNOSIS — R109 Unspecified abdominal pain: Secondary | ICD-10-CM | POA: Diagnosis not present

## 2020-07-01 DIAGNOSIS — K5900 Constipation, unspecified: Secondary | ICD-10-CM | POA: Diagnosis not present

## 2020-07-01 DIAGNOSIS — R194 Change in bowel habit: Secondary | ICD-10-CM | POA: Insufficient documentation

## 2020-07-01 DIAGNOSIS — R1084 Generalized abdominal pain: Secondary | ICD-10-CM

## 2020-07-01 MED ORDER — IOHEXOL 300 MG/ML  SOLN
100.0000 mL | Freq: Once | INTRAMUSCULAR | Status: AC | PRN
  Administered 2020-07-01: 100 mL via INTRAVENOUS

## 2020-07-08 DIAGNOSIS — Z87440 Personal history of urinary (tract) infections: Secondary | ICD-10-CM | POA: Diagnosis present

## 2020-07-08 DIAGNOSIS — N952 Postmenopausal atrophic vaginitis: Secondary | ICD-10-CM | POA: Diagnosis not present

## 2020-07-08 DIAGNOSIS — N390 Urinary tract infection, site not specified: Secondary | ICD-10-CM | POA: Diagnosis not present

## 2020-07-08 DIAGNOSIS — N3281 Overactive bladder: Secondary | ICD-10-CM | POA: Diagnosis not present

## 2020-07-10 IMAGING — DX PORTABLE CHEST - 1 VIEW
1 series · 1 of 1 positions shown · non-contrast
Comparison: Prior radiograph from 12/12/2014.

CLINICAL DATA: Initial evaluation for acute cough.

EXAM:
PORTABLE CHEST 1 VIEW

[chest ap]
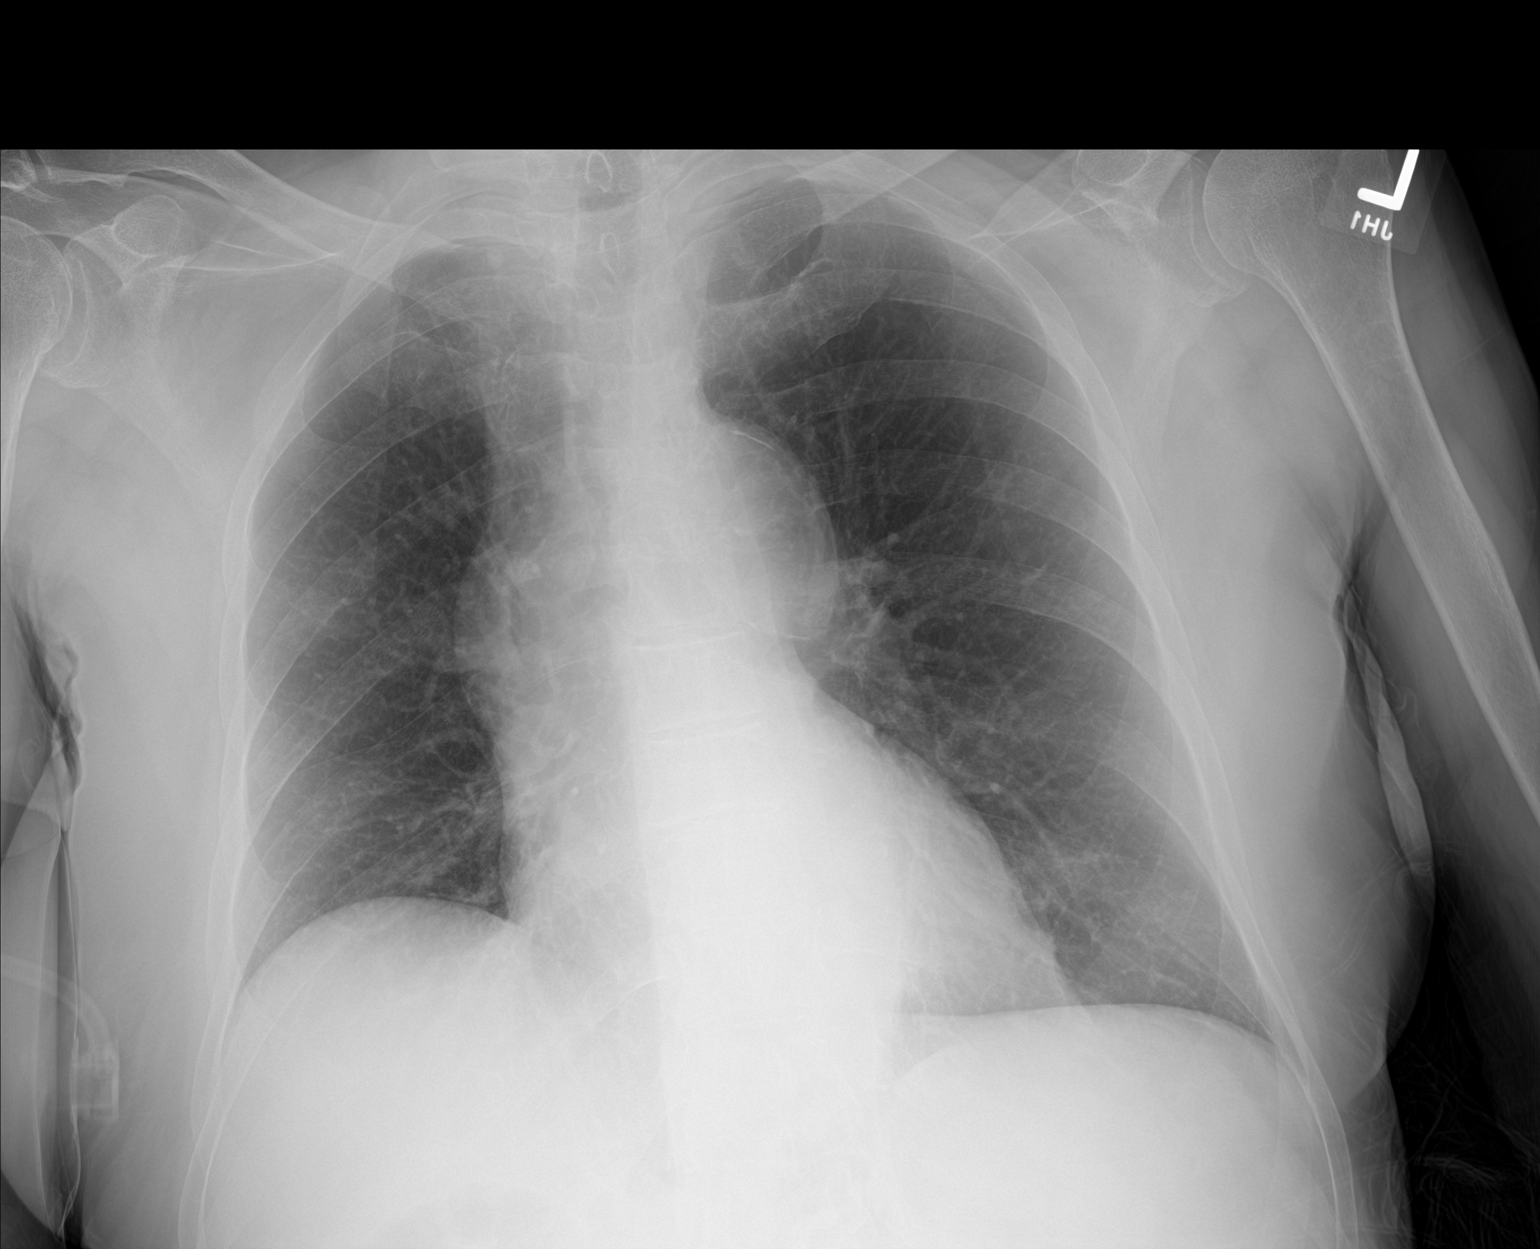

[1 of 1 positions shown; findings below may reference images not displayed]

FINDINGS: Transverse heart size within normal limits. Mediastinal silhouette
normal. Aortic atherosclerosis with mild tortuosity the
intrathoracic aorta.

Lungs mildly hypoinflated. No focal infiltrates. No edema or
effusion. No pneumothorax.

No acute osseous finding.
IMPRESSION: 1. No active cardiopulmonary disease.
2. Aortic atherosclerosis.

## 2020-07-11 IMAGING — CT CT HEAD WITHOUT CONTRAST
3 series · 15 of 47 positions shown, 18 images · non-contrast
Comparison: Brain MRI 06/20/2016, orbital CT 02/08/2015

CLINICAL DATA: Headache, acute, normal neuro exam

EXAM:
CT HEAD WITHOUT CONTRAST
TECHNIQUE: Contiguous axial images were obtained from the base of the skull
through the vertex without intravenous contrast.

[Series 2: head wo · axial · 0.42mm/px · z∈[-130,-5]mm · 9 of 31 slices shown, 12 images]
[im 3/31  brain]
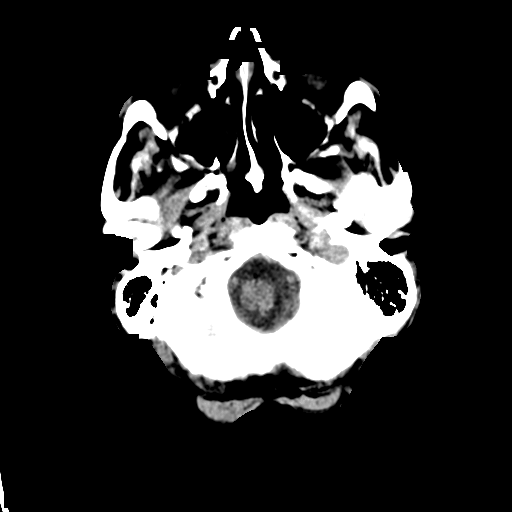
[im 3/31  bone]
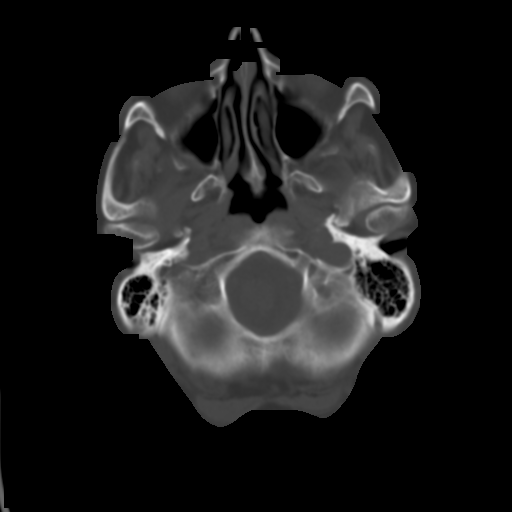
[im 6/31  brain]
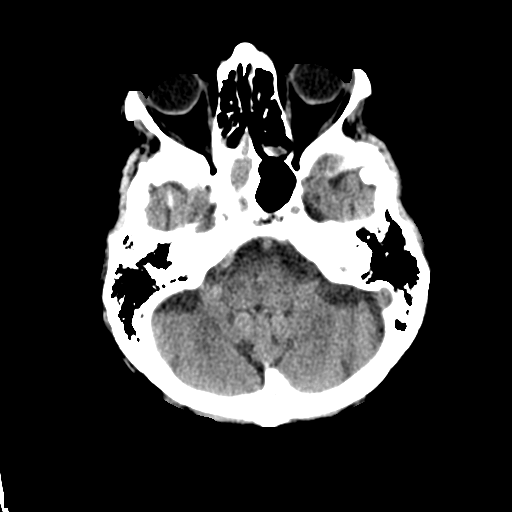
[im 9/31  brain]
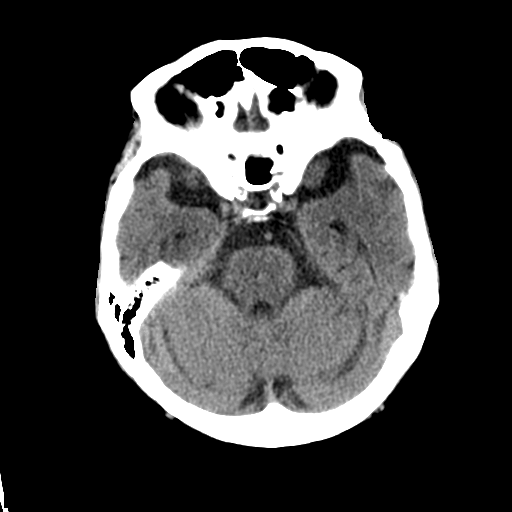
[im 12/31  brain]
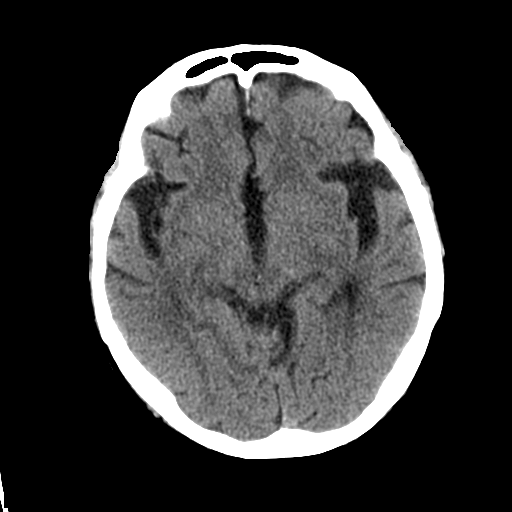
[im 16/31  brain]
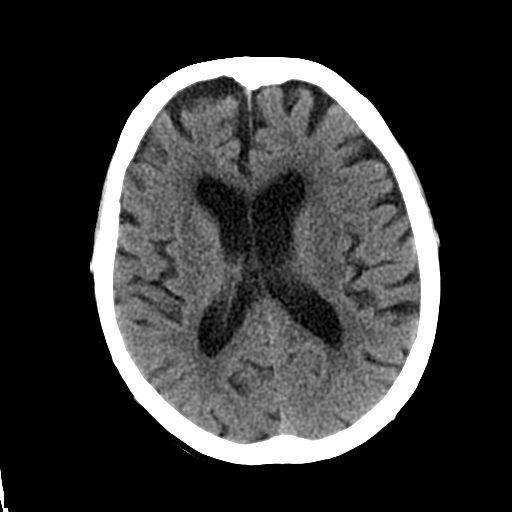
[im 16/31  bone]
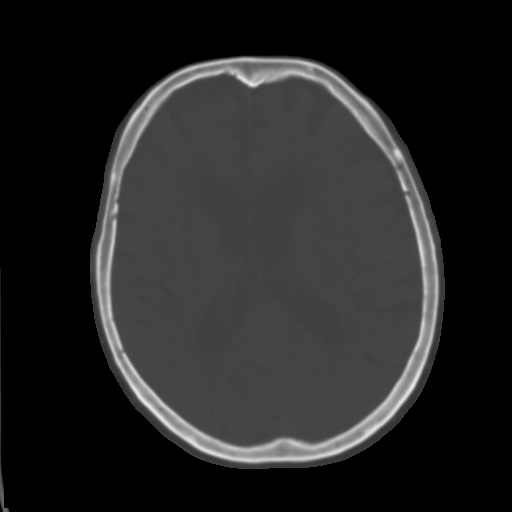
[im 19/31  brain]
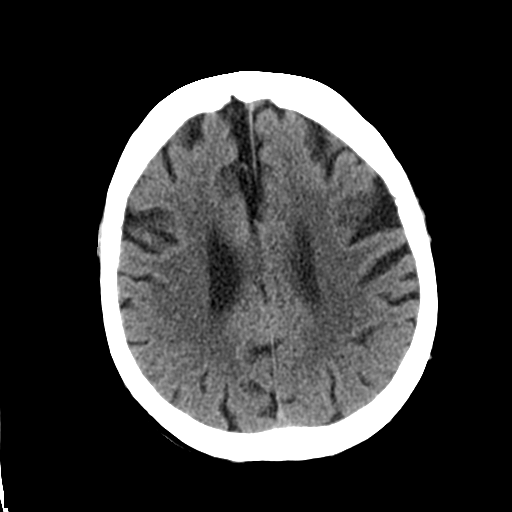
[im 22/31  brain]
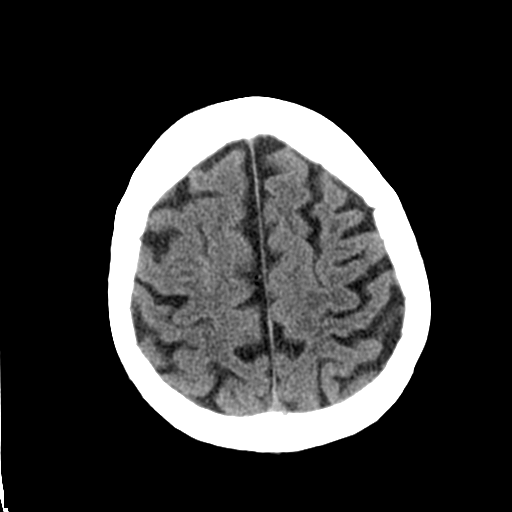
[im 25/31  brain]
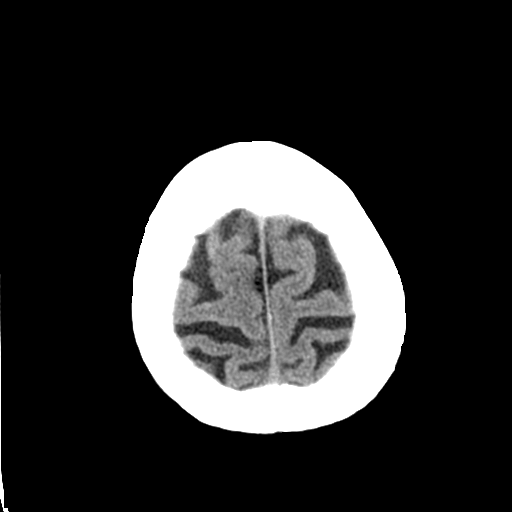
[im 28/31  brain]
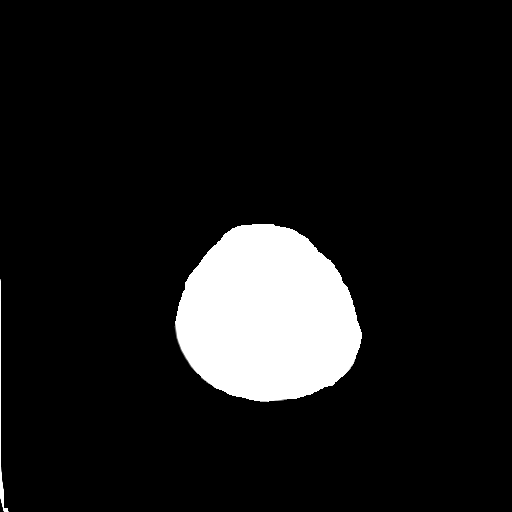
[im 28/31  bone]
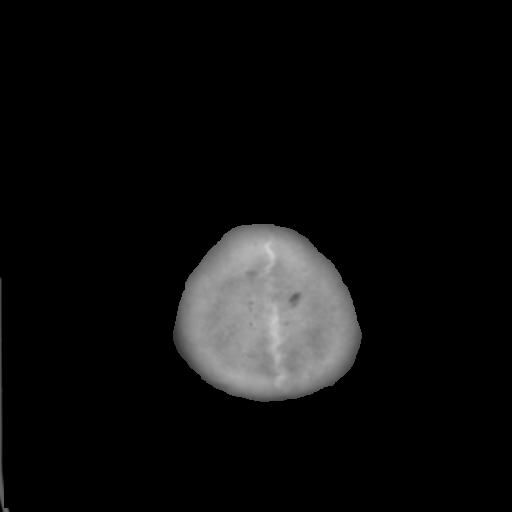

[Series 5: coronal soft tissue · coronal · 0.31mm/px · 3 of 67 slices shown]
[im 23/67  brain]
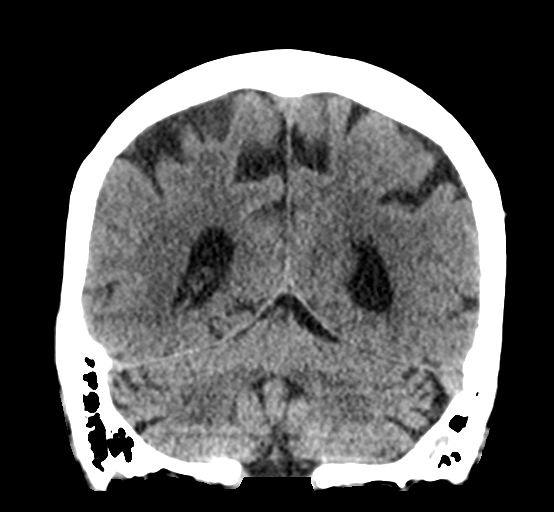
[im 30/67  brain]
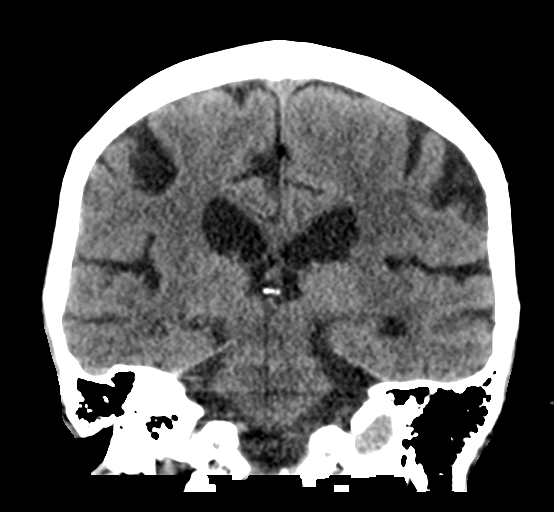
[im 37/67  brain]
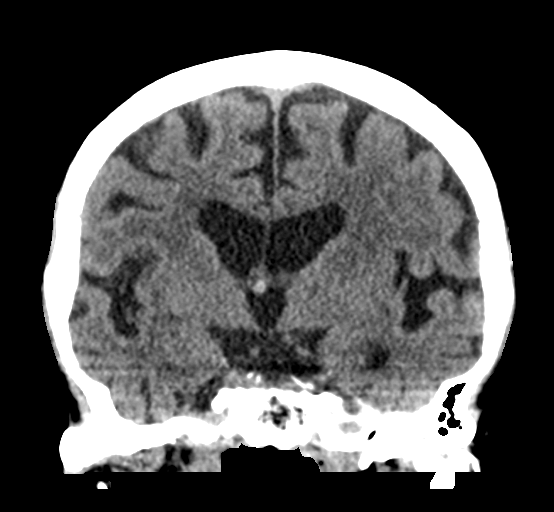

[Series 6: sagittal soft tissue · sagittal · 0.31mm/px · 3 of 57 slices shown]
[im 19/57  brain]
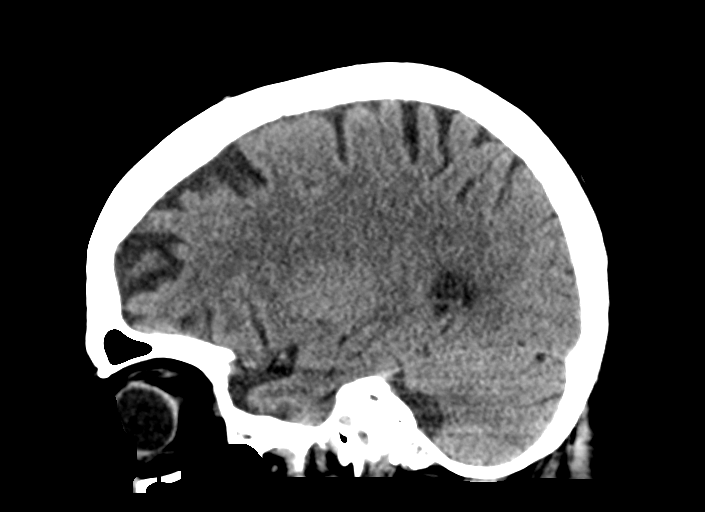
[im 29/57  brain]
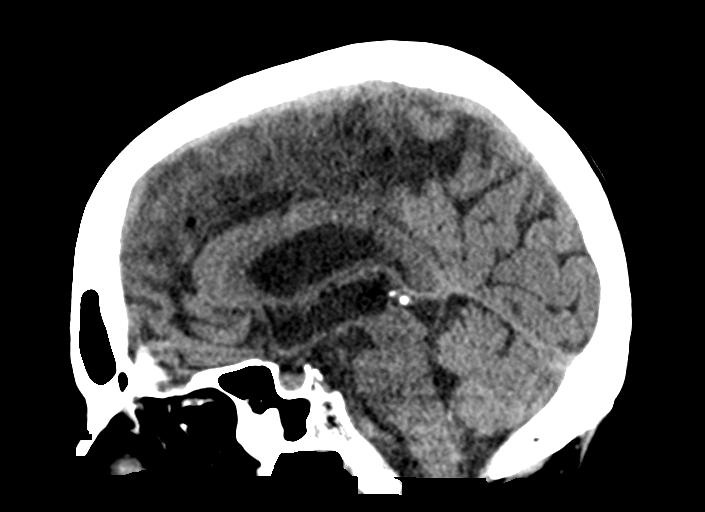
[im 38/57  brain]
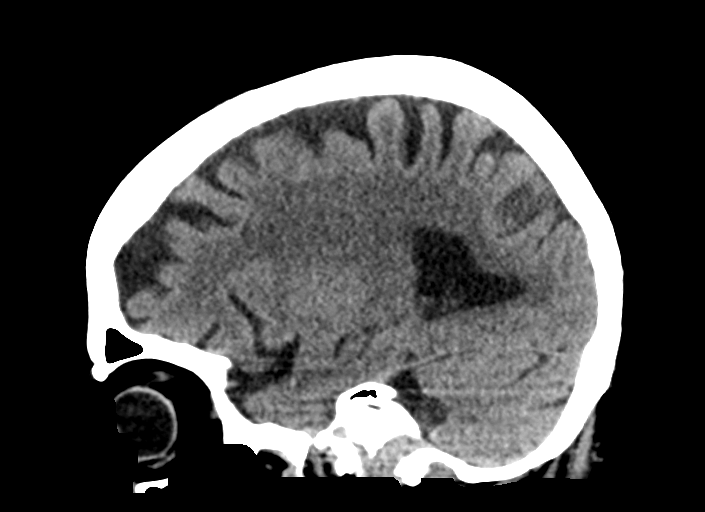

[15 of 47 positions shown; findings below may reference images not displayed]

FINDINGS: Brain: No intracranial hemorrhage, mass effect, or midline shift.
Age related atrophy. Mild chronic small vessel ischemia. No
hydrocephalus. The basilar cisterns are patent. No evidence of
territorial infarct or acute ischemia. No extra-axial or
intracranial fluid collection.

Vascular: Atherosclerosis of skullbase vasculature without
hyperdense vessel or abnormal calcification.

Skull: No fracture or focal lesion.

Sinuses/Orbits: Opacification of right sphenoid sinus with
surrounding sclerosis, unchanged from prior exam consistent with
chronic sinusitis. Scattered mucosal thickening of ethmoid air
cells. Bilateral cataract resection. Mastoid air cells are clear.

Other: None.
IMPRESSION: 1. No acute intracranial abnormality.
2. Age related atrophy and chronic small vessel ischemia.
3. Chronic right sphenoid sinusitis.

## 2020-08-23 ENCOUNTER — Telehealth: Payer: Self-pay | Admitting: Cardiology

## 2020-08-23 NOTE — Telephone Encounter (Signed)
Pt c/o Syncope: STAT if syncope occurred within 30 minutes and pt complains of lightheadedness High Priority if episode of passing out, completely, today or in last 24 hours   1. Did you pass out today? No  2. When is the last time you passed out? 08/22/20   3. Has this occurred multiple times? First time  4. Did you have any symptoms prior to passing out? Dizziness   BP yesterday 80/51 Today 108/60 Pt states she can be contacted on either phone but she has her cellphone in her pocket.

## 2020-08-23 NOTE — Telephone Encounter (Signed)
Spoke with pt, she did not blackout but she felt so weak that she went down to the floor and had to lay there for about 15 min before she was able to get to the phone and call her daughter. She only has chlorthalidone to take when her bp is over 130 and she has not taken that for sometime. Prior to feeling dizzy she had been lying in bed watching a movie. Orthostatic precautions discussed with the patient. Also encouraged to eat a little salt when her bp is so low to help her blood volume. She has increased her fluid intake as well. She was made a follow up appointment for 09-22-20 and will only see dr hochrein, she waould like to be seen sooner. Aware will forward this for his review.

## 2020-08-24 NOTE — Progress Notes (Signed)
Cardiology Office Note   Date:  08/25/2020   ID:  Brittini Brubeck, DOB [birth year], MRN [id number]  PCP:  Lavone Orn, MD  Cardiologist:   Minus Breeding, MD   Chief Complaint  Patient presents with  . Loss of Consciousness      History of Present Illness: Kerri [name] is a 87 y.o. female who presents for ongoing assessment and management of hypertension, palpitations, with other history to include cluster headaches.  She was seen in the emergency room on 12/06/2018 for migraine and dizziness.  She was noted to be hypokalemic with a potassium of 2.7, she was also found to be positive for UTI, EKG revealed sinus rhythm with frequent PVCs.  She was given potassium replacement, started on Keflex 500 mg, and also given steroid taper for presumptive temporal arteritis.    She called and was added to my schedule because of an episode of syncope.  She said she was lying on the bed watching Netflix.  Her dog lady wanted to go into another room.  She stood up and started walking into her room.  She felt dizzy and was hanging onto the walls.  She was staggering.  She probably walked about 25 feet when she went down abruptly to the ground.  She never lost consciousness.  She could not get up however for about 15 minutes.  She had trouble moving her arms.  She did have loss of vision.  She recovered and called her daughter.  She has been dizzy sometimes standing up.  She has not had any chest pressure, neck or arm discomfort.  She has not had any palpitations.  She denies any shortness of breath, PND or orthopnea.  She walks with a cane with knee problems having had knee surgery.  Past Medical History:  Diagnosis Date  . Adenomatous colon polyp   . Asthma   . Basal cell carcinoma 03/13/2017   left forehead - CX3 + cautery + 5FU  . CAD (coronary artery disease)    Chest CTA 10/07: 40% or less pLAD;  ETT 8/09: negative  . Carotid bruit   . Chronic back pain   . Depression   . Diverticulosis   . DJD  (degenerative joint disease)   . Dyslipidemia   . Dysrhythmia    pvcs  . Fatty liver   . GERD (gastroesophageal reflux disease)   . HTN (hypertension)   . Hypothyroidism   . IBS (irritable bowel syndrome)   . Internal hemorrhoid   . Melanoma ([place])    metastatic; left leg; s/p multiple excisions  . Migraine   . Osteoporosis   . Spinal stenosis   . Squamous cell carcinoma of skin 09/02/2015   mid chest - CX3 + 5FU  . Squamous cell carcinoma of skin 11/07/2017   left wrist - tx p bx  . Squamous cell carcinoma of skin 12/11/2017   left hand - tx p bx    Past Surgical History:  Procedure Laterality Date  . BACK SURGERY    . BLEPHAROPLASTY    . BUNIONECTOMY WITH HAMMERTOE RECONSTRUCTION Right 01/16/2013   Procedure: RIGHT Barbie Banner OSTEOTOMY WITH SIMPLE BUNIONECTOMY AND SECOND HAMMER TOE CORRECTION;  Surgeon: Wylene Simmer, MD;  Location: [facility];  Service: Orthopedics;  Laterality: Right;  . CARPAL TUNNEL RELEASE    . KNEE ARTHROSCOPY Right   . LUMBAR LAMINECTOMY/DECOMPRESSION MICRODISCECTOMY N/A 11/13/2013   Procedure: LUMBAR TWO-THREE,LUMBAR THREE-FOUR,LUMBAR FOUR-FIVE LAMINECTOMY AND FORAMINOTOMY;  Surgeon: Ophelia Charter, MD;  Location: [facility];  Service: Neurosurgery;  Laterality: N/A;  . MELANOMA EXCISION Left 09/19/2012   Procedure: WIDE EXCISION METASTATIC MELANOMA LEFT MEDIAL THIGH;  Surgeon: Zenovia Jarred, MD;  Location: [facility];  Service: General;  Laterality: Left;  . ROTATOR CUFF REPAIR    . SHOULDER SURGERY    . TOE SURGERY    . TOTAL HIP ARTHROPLASTY  09/04/2011   Procedure: TOTAL HIP ARTHROPLASTY;  Surgeon: Gearlean Alf, MD;  Location: [facility];  Service: Orthopedics;  Laterality: Left;  . TOTAL HIP REVISION Left 11/27/2018   Procedure: Left hip femoral revision;  Surgeon: Gaynelle Arabian, MD;  Location: [facility];  Service: Orthopedics;  Laterality: Left;  124min  . TOTAL KNEE ARTHROPLASTY Right 10/06/2019   Procedure: TOTAL KNEE  ARTHROPLASTY;  Surgeon: Gaynelle Arabian, MD;  Location: [facility];  Service: Orthopedics;  Laterality: Right;  54min     Current Outpatient Medications  Medication Sig Dispense Refill  . acetaminophen (TYLENOL) 500 MG tablet Take 500 mg by mouth daily.    Marland Kitchen alendronate (FOSAMAX) 70 MG tablet Take 70 mg by mouth every 7 (seven) days.    . Biotin 5 MG CAPS Take 5 mg by mouth daily.    . calcium carbonate (OS-CAL) 1250 (500 Ca) MG chewable tablet Chew 2 tablets by mouth daily.     . Cholecalciferol (VITAMIN D3) 50 MCG (2000 UT) TABS Take 2,000 Units by mouth daily.    Marland Kitchen estradiol (ESTRACE) 0.1 MG/GM vaginal cream Place 1 Applicatorful vaginally every other day.     . Eyelid Cleansers (AVENOVA) 0.01 % SOLN Apply topically.    . fluticasone (FLONASE) 50 MCG/ACT nasal spray Place 2 sprays into both nostrils every morning.     . gabapentin (NEURONTIN) 300 MG capsule Take 300 mg by mouth 2 (two) times daily.     Marland Kitchen levothyroxine (SYNTHROID, LEVOTHROID) 75 MCG tablet Take 75 mcg by mouth daily with breakfast.    . loratadine (CLARITIN) 10 MG tablet Take 10 mg by mouth daily.    . naproxen sodium (ALEVE) 220 MG tablet Take by mouth.    . polyethylene glycol powder (GLYCOLAX/MIRALAX) 17 GM/SCOOP powder Take 8.5 g by mouth daily.     . Probiotic Product (PROBIOTIC PO) Take 1 capsule by mouth daily.    . rosuvastatin (CRESTOR) 10 MG tablet Take 10 mg by mouth at bedtime.    . tretinoin (RETIN-A) 0.05 % cream Apply 1 application topically every other day.     . trimethoprim (TRIMPEX) 100 MG tablet Take 100 mg by mouth daily. UTI prevention    . vitamin B-12 (CYANOCOBALAMIN) 500 MCG tablet Take 500 mcg by mouth daily.    . chlorthalidone (HYGROTON) 25 MG tablet Take 0.5 tablets (12.5 mg total) by mouth daily. Only takes if bp over 355 systolic 15 tablet 2  . potassium chloride SA (KLOR-CON) 10 MEQ tablet Take 1 tablet (10 mEq total) by mouth daily. 30 tablet 2   No current facility-administered medications  for this visit.    Allergies:   Buprenorphine hcl, Codeine, Morphine and related, Tramadol, Adhesive [tape], Hydrocodone-acetaminophen, Lactose intolerance (gi), and Oxycodone    ROS:  Please see the history of present illness.   Otherwise, review of systems are positive for none.   All other systems are reviewed and negative.    PHYSICAL EXAM: VS:  Pulse 73   Ht 5' 5.5" (1.664 m)   Wt 129 lb (58.5 kg)   SpO2 98%  BMI 21.14 kg/m  , BMI Body mass index is 21.14 kg/m. GENERAL:  Well appearing NECK:  No jugular venous distention, waveform within normal limits, carotid upstroke brisk and symmetric, no bruits, no thyromegaly LUNGS:  Clear to auscultation bilaterally CHEST:  Unremarkable HEART:  PMI not displaced or sustained,S1 and S2 within normal limits, no S3, no S4, no clicks, no rubs, 2 out of 6 apical systolic murmur early to mid peaking, no diastolic murmurs ABD:  Flat, positive bowel sounds normal in frequency in pitch, no bruits, no rebound, no guarding, no midline pulsatile mass, no hepatomegaly, no splenomegaly EXT:  2 plus pulses throughout, no edema, no cyanosis no clubbing    EKG:  EKG is  ordered today. Regular rhythm possible ectopic with premature atrial contractions, axis within normal limits, intervals within normal limits, no acute ST-T wave changes.  Recent Labs: 06/21/2020: ALT 8; BUN 12; Creatinine, Ser 0.84; Hemoglobin 12.2; Platelets 222.0; Potassium 4.0; Sodium 138    Lipid Panel No results found for: CHOL, TRIG, HDL, CHOLHDL, VLDL, LDLCALC, LDLDIRECT    Wt Readings from Last 3 Encounters:  08/25/20 129 lb (58.5 kg)  06/21/20 125 lb (56.7 kg)  04/08/20 124 lb (56.2 kg)      Other studies Reviewed: Additional studies/ records that were reviewed today include:   Labs. Review of the above records demonstrates:  Please see elsewhere in the note.     ASSESSMENT AND PLAN:   Syncope: This was likely related to orthostatic hypotension.  She had a  significant drop in her blood pressure with standing but did recover after standing still for a couple of minutes.  Today I am going to reduce her chlorthalidone to 12 and half milligrams daily and potassium 10 mEq daily.  We talked about maneuvers to try to reduce the orthostatic blood pressure drop and to be careful not to change positions quickly or moves quickly after standing up.  No further work-up is suggested.  Of note I did see some labs recently and she is not anemic.  Renal function was normal.  Hypertension:    This will be managed as above.   Murmur: She has had aortic sclerosis without significant stenosis.  I would not suspect this to be clinically different.  No further imaging at this point..  Current medicines are reviewed at length with the patient today.  The patient does not have concerns regarding medicines.  The following changes have been made: As above  Labs/ tests ordered today include: None.  Orders Placed This Encounter  Procedures  . EKG 12-Lead     Disposition:   FU with me in 3 months   Signed, Minus Breeding, MD  08/25/2020 9:55 AM    [place]

## 2020-08-24 NOTE — Telephone Encounter (Signed)
Hi,  Can we put her on for 8 am tomorrow.  I can start earlier as my meeting is cancelled.  She often shows up late and would need to know to be on time and whoever is working with me will need to be alerted that I will start earlier.  This also opens an 8:20 slot.  Thanks.

## 2020-08-24 NOTE — Telephone Encounter (Signed)
Spoke with patient and she has an extremely difficult time walking firs thing in am I scheduled her at 9:00 and advised would not be able to see her if she is late, verbalized understanding

## 2020-08-25 ENCOUNTER — Encounter: Payer: Self-pay | Admitting: Cardiology

## 2020-08-25 ENCOUNTER — Ambulatory Visit (INDEPENDENT_AMBULATORY_CARE_PROVIDER_SITE_OTHER): Payer: Medicare Other | Admitting: Cardiology

## 2020-08-25 ENCOUNTER — Other Ambulatory Visit: Payer: Self-pay

## 2020-08-25 VITALS — HR 73 | Ht 65.5 in | Wt 129.0 lb

## 2020-08-25 DIAGNOSIS — R55 Syncope and collapse: Secondary | ICD-10-CM

## 2020-08-25 MED ORDER — CHLORTHALIDONE 25 MG PO TABS: 12.5000 mg | ORAL_TABLET | Freq: Every day | ORAL | 2 refills | Status: DC

## 2020-08-25 MED ORDER — POTASSIUM CHLORIDE CRYS ER 10 MEQ PO TBCR: 10.0000 meq | EXTENDED_RELEASE_TABLET | Freq: Every day | ORAL | 2 refills | Status: DC

## 2020-08-25 NOTE — Patient Instructions (Signed)
Medication Instructions:  DECREASE the Potassium to 10 mEq once daily DECREASE the Chlorothalidone to 12.5 mg (half a 25 mg tablet)  *If you need a refill on your cardiac medications before your next appointment, please call your pharmacy*   Lab Work: None ordered If you have labs (blood work) drawn today and your tests are completely normal, you will receive your results only by: Marland Kitchen MyChart Message (if you have MyChart) OR . A paper copy in the mail If you have any lab test that is abnormal or we need to change your treatment, we will call you to review the results.   Testing/Procedures: None ordered   Follow-Up: At [facility], you and your health needs are our priority.  As part of our continuing mission to provide you with exceptional heart care, we have created designated Provider Care Teams.  These Care Teams include your primary Cardiologist (physician) and Advanced Practice Providers (APPs -  Physician Assistants and Nurse Practitioners) who all work together to provide you with the care you need, when you need it.  We recommend signing up for the patient portal called "MyChart".  Sign up information is provided on this After Visit Summary.  MyChart is used to connect with patients for Virtual Visits (Telemedicine).  Patients are able to view lab/test results, encounter notes, upcoming appointments, etc.  Non-urgent messages can be sent to your provider as well.   To learn more about what you can do with MyChart, go to [email or web address].    Your next appointment:   3 month(s)  The format for your next appointment:   In Person  Provider:   You may see Minus Breeding, MD or one of the following Advanced Practice Providers on your designated Care Team:    Rosaria Ferries, PA-C  Jory Sims, DNP, ANP

## 2020-08-31 DIAGNOSIS — N2889 Other specified disorders of kidney and ureter: Secondary | ICD-10-CM | POA: Diagnosis not present

## 2020-09-09 DIAGNOSIS — M25552 Pain in left hip: Secondary | ICD-10-CM | POA: Diagnosis not present

## 2020-09-09 DIAGNOSIS — M25561 Pain in right knee: Secondary | ICD-10-CM | POA: Diagnosis not present

## 2020-09-16 ENCOUNTER — Encounter: Payer: Self-pay | Admitting: Physician Assistant

## 2020-09-16 ENCOUNTER — Other Ambulatory Visit: Payer: Self-pay

## 2020-09-16 ENCOUNTER — Ambulatory Visit: Payer: Medicare Other | Admitting: Physician Assistant

## 2020-09-16 DIAGNOSIS — Z1283 Encounter for screening for malignant neoplasm of skin: Secondary | ICD-10-CM | POA: Diagnosis not present

## 2020-09-21 ENCOUNTER — Ambulatory Visit: Payer: Medicare Other | Admitting: Cardiology

## 2020-10-08 ENCOUNTER — Encounter: Payer: Self-pay | Admitting: Physician Assistant

## 2020-10-08 NOTE — Progress Notes (Signed)
   Follow-Up Visit   Subjective  Kerri [name] is a 87 y.o. female who presents for the following: Skin Problem (New lesion on right forearm x months- no itch no bleed ).   The following portions of the chart were reviewed this encounter and updated as appropriate:  Tobacco  Allergies  Meds  Problems  Med Hx  Surg Hx  Fam Hx      Objective  Well appearing patient in no apparent distress; mood and affect are within normal limits.  All skin waist up examined.  Objective  waist up: No atypical nevi No signs of non-mole skin cancer.    Assessment & Plan  Encounter for screening for malignant neoplasm of skin waist up  Biannual skin exam    I, Nickalaus Crooke, PA-C, have reviewed all documentation's for this visit.  The documentation on 10/08/20 for the exam, diagnosis, procedures and orders are all accurate and complete.

## 2020-11-01 DIAGNOSIS — E039 Hypothyroidism, unspecified: Secondary | ICD-10-CM | POA: Diagnosis not present

## 2020-11-01 DIAGNOSIS — I10 Essential (primary) hypertension: Secondary | ICD-10-CM | POA: Diagnosis not present

## 2020-11-01 DIAGNOSIS — E782 Mixed hyperlipidemia: Secondary | ICD-10-CM | POA: Diagnosis not present

## 2020-11-01 DIAGNOSIS — J45909 Unspecified asthma, uncomplicated: Secondary | ICD-10-CM | POA: Diagnosis not present

## 2020-11-15 DIAGNOSIS — J019 Acute sinusitis, unspecified: Secondary | ICD-10-CM | POA: Diagnosis not present

## 2020-11-23 ENCOUNTER — Ambulatory Visit: Payer: Medicare Other | Admitting: Cardiology

## 2020-11-23 DIAGNOSIS — C649 Malignant neoplasm of unspecified kidney, except renal pelvis: Secondary | ICD-10-CM | POA: Diagnosis not present

## 2020-11-23 DIAGNOSIS — K219 Gastro-esophageal reflux disease without esophagitis: Secondary | ICD-10-CM | POA: Diagnosis not present

## 2020-11-23 DIAGNOSIS — Z79899 Other long term (current) drug therapy: Secondary | ICD-10-CM | POA: Diagnosis not present

## 2020-11-23 DIAGNOSIS — N2889 Other specified disorders of kidney and ureter: Secondary | ICD-10-CM | POA: Diagnosis not present

## 2020-11-23 DIAGNOSIS — M810 Age-related osteoporosis without current pathological fracture: Secondary | ICD-10-CM | POA: Diagnosis not present

## 2020-11-23 DIAGNOSIS — I10 Essential (primary) hypertension: Secondary | ICD-10-CM | POA: Diagnosis not present

## 2020-11-23 DIAGNOSIS — D649 Anemia, unspecified: Secondary | ICD-10-CM | POA: Diagnosis not present

## 2020-11-23 DIAGNOSIS — Z87440 Personal history of urinary (tract) infections: Secondary | ICD-10-CM | POA: Diagnosis not present

## 2020-11-23 DIAGNOSIS — I2510 Atherosclerotic heart disease of native coronary artery without angina pectoris: Secondary | ICD-10-CM | POA: Diagnosis not present

## 2020-11-25 DIAGNOSIS — D649 Anemia, unspecified: Secondary | ICD-10-CM | POA: Diagnosis not present

## 2020-12-20 DIAGNOSIS — N2889 Other specified disorders of kidney and ureter: Secondary | ICD-10-CM | POA: Diagnosis not present

## 2020-12-20 DIAGNOSIS — Z87440 Personal history of urinary (tract) infections: Secondary | ICD-10-CM | POA: Diagnosis not present

## 2020-12-20 DIAGNOSIS — N3281 Overactive bladder: Secondary | ICD-10-CM | POA: Diagnosis not present

## 2020-12-20 DIAGNOSIS — N952 Postmenopausal atrophic vaginitis: Secondary | ICD-10-CM | POA: Diagnosis not present

## 2020-12-28 DIAGNOSIS — N2889 Other specified disorders of kidney and ureter: Secondary | ICD-10-CM | POA: Diagnosis not present

## 2020-12-30 ENCOUNTER — Ambulatory Visit: Payer: Medicare Other | Admitting: Physician Assistant

## 2020-12-30 ENCOUNTER — Encounter: Payer: Self-pay | Admitting: Physician Assistant

## 2020-12-30 ENCOUNTER — Other Ambulatory Visit: Payer: Self-pay

## 2020-12-30 DIAGNOSIS — L570 Actinic keratosis: Secondary | ICD-10-CM | POA: Diagnosis not present

## 2020-12-31 DIAGNOSIS — D649 Anemia, unspecified: Secondary | ICD-10-CM | POA: Diagnosis not present

## 2021-01-05 ENCOUNTER — Encounter: Payer: Self-pay | Admitting: Physician Assistant

## 2021-01-05 NOTE — Progress Notes (Addendum)
   Follow-Up Visit   Subjective  Kerri [name] is a 87 y.o. female who presents for the following: Follow-up (3 mth- right hand x months- scaly spot/).   The following portions of the chart were reviewed this encounter and updated as appropriate:  Tobacco  Allergies  Meds  Problems  Med Hx  Surg Hx  Fam Hx      Objective  Well appearing patient in no apparent distress; mood and affect are within normal limits.  All skin waist up examined.  Chest - Medial (Center) (3), Right Hand - Posterior (2) Erythematous patches with gritty scale.   Assessment & Plan  AK (actinic keratosis) (5) Right Hand - Posterior (2); Chest - Medial (Center) (3)  Destruction of lesion - Chest - Medial (Center), Right Hand - Posterior  Destruction method: cryotherapy   Informed consent: discussed and consent obtained   Timeout:  patient name, date of birth, surgical site, and procedure verified Lesion destroyed using liquid nitrogen: Yes   Cryotherapy cycles:  1 Outcome: patient tolerated procedure well with no complications   Post-procedure details: wound care instructions given      I, Shynice Sigel, PA-C, have reviewed all documentation's for this visit.  The documentation on 01/05/21 for the exam, diagnosis, procedures and orders are all accurate and complete.

## 2021-01-14 DIAGNOSIS — E782 Mixed hyperlipidemia: Secondary | ICD-10-CM | POA: Diagnosis not present

## 2021-01-14 DIAGNOSIS — E039 Hypothyroidism, unspecified: Secondary | ICD-10-CM | POA: Diagnosis not present

## 2021-01-14 DIAGNOSIS — I10 Essential (primary) hypertension: Secondary | ICD-10-CM | POA: Diagnosis not present

## 2021-01-14 DIAGNOSIS — J45909 Unspecified asthma, uncomplicated: Secondary | ICD-10-CM | POA: Diagnosis not present

## 2021-01-19 ENCOUNTER — Telehealth: Payer: Self-pay | Admitting: Cardiology

## 2021-01-19 MED ORDER — CHLORTHALIDONE 25 MG PO TABS
ORAL_TABLET | ORAL | 3 refills | Status: DC
Start: 2021-01-19 — End: 2021-03-31

## 2021-01-19 NOTE — Telephone Encounter (Signed)
Spoke to patient Dr.Hochrein's advice given.She will take Chlorthalidone 25 mg 1/2 tablet everyday.She will monitor B/P and call back to report readings in 1 week.

## 2021-01-19 NOTE — Telephone Encounter (Signed)
Pt c/o BP issue: STAT if pt c/o blurred vision, one-sided weakness or slurred speech  1. What are your last 5 BP readings?   Patient doesn't have exact readings, but she states yesterday it was around 190/90 and today it is around 185/70-80  2. Are you having any other symptoms (ex. Dizziness, headache, blurred vision, passed out)?  No  3. What is your BP issue?  Patient states her BP has been elevated.

## 2021-01-19 NOTE — Telephone Encounter (Signed)
Spoke to patient she stated her B/P has been elevated for the past 1 week.This morning B/P 185/90.She did not check pulse.Stated she only takes Chlorthalidone 25 mg if systolic B/P greater than 130.She is scheduled for surgery at [facility] 01/28/21 to remove cyst from kidney.She wanted to ask Dr.Hochrein advice.Advised I will send message to him.

## 2021-01-26 ENCOUNTER — Telehealth: Payer: Self-pay | Admitting: Cardiology

## 2021-01-26 NOTE — Telephone Encounter (Signed)
Called pt back in regards to BP readings.  She is asymptomatic, just nervous that her BP is getting higher.  She would like for someone to explain to her why her BP is going up (looking through notes pt does have a renal mass, I did not mention this to pt).  I advised her that I would send a message to MD to follow up.

## 2021-01-26 NOTE — Telephone Encounter (Signed)
Pt c/o BP issue: STAT if pt c/o blurred vision, one-sided weakness or slurred speech  1. What are your last 5 BP readings?  BP Taken once a day: 117/72/74  116/88/79 134/94/77 148/78/76 Yesterday 118/78/83 Today 158/84/68  2. Are you having any other symptoms (ex. Dizziness, headache, blurred vision, passed out)? no  3. What is your BP issue? Patient states her BP has never been high before, but has been getting high now.

## 2021-01-28 DIAGNOSIS — N2889 Other specified disorders of kidney and ureter: Secondary | ICD-10-CM | POA: Diagnosis not present

## 2021-01-28 DIAGNOSIS — Z79899 Other long term (current) drug therapy: Secondary | ICD-10-CM | POA: Diagnosis not present

## 2021-01-28 NOTE — Telephone Encounter (Signed)
Left a message for pt to call back [phone].

## 2021-01-31 NOTE — Telephone Encounter (Signed)
Called pt reviewed MD recommendation that she check her BP 3x/ day for 10 days.  She is agreeable to plan (check morning, noon, and night).  She expressed that she is having trouble cutting 1 of her pills in half.  I advised her to buy a pill slicer and it will make it much easier.  She wrote down the name and expressed that she would get one.  All questions answered.

## 2021-02-01 NOTE — Telephone Encounter (Signed)
Called patient, she thought she was suppose to take her BP medications 3 times daily- I did advise that those medications were once daily, and she was to CHECK her blood pressure three times daily. Also advised to only take potassium once daily, and to monitor BP and bring to appointment tomorrow and he could discuss making any adjustments.  Patient verbalized understanding.

## 2021-02-01 NOTE — Telephone Encounter (Signed)
Patient states she takes potassium with her BP medication and would like to know if she needs to take it 3 times a day as well. She also would like to know what are her BP parameters are so she knows when to take or not take her medication.

## 2021-02-01 NOTE — Progress Notes (Signed)
Cardiology Office Note   Date:  02/02/2021   ID:  Kerri [name], DOB 08-28-1929, MRN [id number]  PCP:  Kerri Orn, MD  Cardiologist:   Minus Breeding, MD   Chief Complaint  Patient presents with   Hypertension       History of Present Illness: Kerri [name] is a 87 y.o. female who presents for ongoing assessment and management of hypertension, palpitations, with other history to include cluster headaches.  She was seen in the emergency room on 12/06/2018 for migraine and dizziness.  She was noted to be hypokalemic with a potassium of 2.7, she was also found to be positive for UTI, EKG revealed sinus rhythm with frequent PVCs.  She was given potassium replacement, started on Keflex 500 mg, and also given steroid taper for presumptive temporal arteritis.    She called and was added because of problems with hypertension.  We had her start taking chlorthalidone half a pill every day and she has been writing down her blood pressures.  They have been fairly well controlled although she had one reading of 0000000 systolic.  Most of the time they have been in the 120s.  She is not having any presyncope or syncope.  She is not having any orthostasis that she had previously.  She is mostly bothered by joint pains and lots of aches.  She is also bothered by constipation.   Past Medical History:  Diagnosis Date   Adenomatous colon polyp    Asthma    Basal cell carcinoma 03/13/2017   left forehead - CX3 + cautery + 5FU   CAD (coronary artery disease)    Chest CTA 10/07: 40% or less pLAD;  ETT 8/09: negative   Carotid bruit    Chronic back pain    Depression    Diverticulosis    DJD (degenerative joint disease)    Dyslipidemia    Dysrhythmia    pvcs   Fatty liver    GERD (gastroesophageal reflux disease)    HTN (hypertension)    Hypothyroidism    IBS (irritable bowel syndrome)    Internal hemorrhoid    Melanoma ([place])    metastatic; left leg; s/p multiple excisions   Migraine     Osteoporosis    Spinal stenosis    Squamous cell carcinoma of skin 09/02/2015   mid chest - CX3 + 5FU   Squamous cell carcinoma of skin 11/07/2017   left wrist - tx p bx   Squamous cell carcinoma of skin 12/11/2017   left hand - tx p bx    Past Surgical History:  Procedure Laterality Date   BACK SURGERY     BLEPHAROPLASTY     BUNIONECTOMY WITH HAMMERTOE RECONSTRUCTION Right 01/16/2013   Procedure: RIGHT Barbie Banner OSTEOTOMY WITH SIMPLE BUNIONECTOMY AND SECOND HAMMER TOE CORRECTION;  Surgeon: Wylene Simmer, MD;  Location: [facility];  Service: Orthopedics;  Laterality: Right;   CARPAL TUNNEL RELEASE     KNEE ARTHROSCOPY Right    LUMBAR LAMINECTOMY/DECOMPRESSION MICRODISCECTOMY N/A 11/13/2013   Procedure: LUMBAR TWO-THREE,LUMBAR THREE-FOUR,LUMBAR FOUR-FIVE LAMINECTOMY AND FORAMINOTOMY;  Surgeon: Ophelia Charter, MD;  Location: [facility];  Service: Neurosurgery;  Laterality: N/A;   MELANOMA EXCISION Left 09/19/2012   Procedure: WIDE EXCISION METASTATIC MELANOMA LEFT MEDIAL THIGH;  Surgeon: Zenovia Jarred, MD;  Location: [facility];  Service: General;  Laterality: Left;   [place]     TOE SURGERY  TOTAL HIP ARTHROPLASTY  09/04/2011   Procedure: TOTAL HIP ARTHROPLASTY;  Surgeon: Gearlean Alf, MD;  [facility];  Service: Orthopedics;  Laterality: Left;   TOTAL HIP REVISION Left 11/27/2018   Procedure: Left hip femoral revision;  Surgeon: Gaynelle Arabian, MD;  [facility]: [facility];  Service: Orthopedics;  Laterality: Left;  187mn   TOTAL KNEE ARTHROPLASTY Right 10/06/2019   Procedure: TOTAL KNEE ARTHROPLASTY;  Surgeon: AGaynelle Arabian MD;  [facility];  Service: Orthopedics;  Laterality: Right;  515m     Current Outpatient Medications  Medication Sig Dispense Refill   acetaminophen (TYLENOL) 500 MG tablet Take 500 mg by mouth daily.     alendronate (FOSAMAX) 70 MG tablet Take 70 mg by mouth every 7 (seven) days.      Biotin 5 MG CAPS Take 5 mg by mouth daily.     calcium carbonate (OS-CAL) 1250 (500 Ca) MG chewable tablet Chew 2 tablets by mouth daily.      chlorthalidone (HYGROTON) 25 MG tablet Take 1/2 tablet ( 12.5 mg ) daily 90 tablet 3   Cholecalciferol (VITAMIN D3) 50 MCG (2000 UT) TABS Take 2,000 Units by mouth daily.     estradiol (ESTRACE) 0.1 MG/GM vaginal cream Place 1 Applicatorful vaginally every other day.      Eyelid Cleansers (AVENOVA) 0.01 % SOLN Apply topically.     ferrous sulfate 325 (65 FE) MG tablet Take 325 mg by mouth daily with breakfast.     fluticasone (FLONASE) 50 MCG/ACT nasal spray Place 2 sprays into both nostrils every morning.      gabapentin (NEURONTIN) 300 MG capsule Take 300 mg by mouth 2 (two) times daily.      levothyroxine (SYNTHROID, LEVOTHROID) 75 MCG tablet Take 75 mcg by mouth daily with breakfast.     loratadine (CLARITIN) 10 MG tablet Take 10 mg by mouth daily.     naproxen sodium (ALEVE) 220 MG tablet Take by mouth.     polyethylene glycol powder (GLYCOLAX/MIRALAX) 17 GM/SCOOP powder Take 8.5 g by mouth daily.      potassium chloride SA (KLOR-CON) 10 MEQ tablet Take 1 tablet (10 mEq total) by mouth daily. 30 tablet 2   Probiotic Product (PROBIOTIC PO) Take 1 capsule by mouth daily.     rosuvastatin (CRESTOR) 10 MG tablet Take 10 mg by mouth at bedtime.     tretinoin (RETIN-A) 0.05 % cream Apply 1 application topically every other day.      trimethoprim (TRIMPEX) 100 MG tablet Take 100 mg by mouth daily. UTI prevention     vitamin B-12 (CYANOCOBALAMIN) 500 MCG tablet Take 500 mcg by mouth daily.     No current facility-administered medications for this visit.    Allergies:   Buprenorphine hcl, Codeine, Morphine and related, Tramadol, Adhesive [tape], Hydrocodone-acetaminophen, Lactose intolerance (gi), and Oxycodone    ROS:  Please see the history of present illness.   Otherwise, review of systems are positive for none.   All other systems are reviewed and  negative.    PHYSICAL EXAM: VS:  BP 124/64   Pulse 71   Ht 5' 5.5" (1.664 m)   Wt 127 lb (57.6 kg)   SpO2 98%   BMI 20.81 kg/m  , BMI Body mass index is 20.81 kg/m. GENERAL:  Well appearing NECK:  No jugular venous distention, waveform within normal limits, carotid upstroke brisk and symmetric, no bruits, no thyromegaly LUNGS:  Clear to auscultation bilaterally CHEST:  Unremarkable HEART:  PMI not displaced  or sustained,S1 and S2 within normal limits, no S3, no S4, no clicks, no rubs, no murmurs ABD:  Flat, positive bowel sounds normal in frequency in pitch, no bruits, no rebound, no guarding, no midline pulsatile mass, no hepatomegaly, no splenomegaly EXT:  2 plus pulses throughout, no edema, no cyanosis no clubbing   EKG:  EKG is not ordered today.   Recent Labs: 06/21/2020: ALT 8; BUN 12; Creatinine, Ser 0.84; Hemoglobin 12.2; Platelets 222.0; Potassium 4.0; Sodium 138    Lipid Panel No results found for: CHOL, TRIG, HDL, CHOLHDL, VLDL, LDLCALC, LDLDIRECT    Wt Readings from Last 3 Encounters:  02/02/21 127 lb (57.6 kg)  08/25/20 129 lb (58.5 kg)  06/21/20 125 lb (56.7 kg)      Other studies Reviewed: Additional studies/ records that were reviewed today include:   None. Review of the above records demonstrates:  Please see elsewhere in the note.     ASSESSMENT AND PLAN:   Syncope:   She has had no further episodes.  I think she was orthostatic and so I am very concerned about using higher doses of blood pressure medications.  We have talked about this.  She will keep a blood pressure diary.  Otherwise no change in therapy.   Hypertension:    The blood pressure will be managed as above.   Constipation: This is a big concern for her.  She very much wants to come off the iron.  She is asking me to check blood counts and iron studies and I will do this I will repeat her with her primary provider about whether we can discontinue this in the future.   Current  medicines are reviewed at length with the patient today.  The patient does not have concerns regarding medicines.  The following changes have been made:  None   Labs/ tests ordered today include:  .  Orders Placed This Encounter  Procedures   CBC   Iron, TIBC and Ferritin Panel      Disposition:   FU with me in 6 months   Signed, Minus Breeding, MD  02/02/2021 1:02 PM    [facility]

## 2021-02-02 ENCOUNTER — Encounter: Payer: Self-pay | Admitting: Cardiology

## 2021-02-02 ENCOUNTER — Other Ambulatory Visit: Payer: Self-pay

## 2021-02-02 ENCOUNTER — Ambulatory Visit: Payer: Medicare Other | Admitting: Cardiology

## 2021-02-02 VITALS — BP 124/64 | HR 71 | Ht 65.5 in | Wt 127.0 lb

## 2021-02-02 DIAGNOSIS — I10 Essential (primary) hypertension: Secondary | ICD-10-CM

## 2021-02-02 DIAGNOSIS — I358 Other nonrheumatic aortic valve disorders: Secondary | ICD-10-CM | POA: Diagnosis not present

## 2021-02-02 DIAGNOSIS — R55 Syncope and collapse: Secondary | ICD-10-CM | POA: Diagnosis not present

## 2021-02-02 NOTE — Patient Instructions (Signed)
Medication Instructions:  The current medical regimen is effective;  continue present plan and medications.  *If you need a refill on your cardiac medications before your next appointment, please call your pharmacy*   Lab Work: CBC, IRON PANEL today   If you have labs (blood work) drawn today and your tests are completely normal, you will receive your results only by: [place] (if you have MyChart) OR A paper copy in the mail If you have any lab test that is abnormal or we need to change your treatment, we will call you to review the results.   Follow-Up: At [facility], you and your health needs are our priority.  As part of our continuing mission to provide you with exceptional heart care, we have created designated Provider Care Teams.  These Care Teams include your primary Cardiologist (physician) and Advanced Practice Providers (APPs -  Physician Assistants and Nurse Practitioners) who all work together to provide you with the care you need, when you need it.  We recommend signing up for the patient portal called "MyChart".  Sign up information is provided on this After Visit Summary.  MyChart is used to connect with patients for Virtual Visits (Telemedicine).  Patients are able to view lab/test results, encounter notes, upcoming appointments, etc.  Non-urgent messages can be sent to your provider as well.   To learn more about what you can do with MyChart, go to [email or web address].    Your next appointment:   6 month(s)  The format for your next appointment:   In Person  Provider:   Minus Breeding, MD   Other Instructions *[place]*

## 2021-02-03 DIAGNOSIS — H0102B Squamous blepharitis left eye, upper and lower eyelids: Secondary | ICD-10-CM | POA: Diagnosis not present

## 2021-02-03 DIAGNOSIS — H26493 Other secondary cataract, bilateral: Secondary | ICD-10-CM | POA: Diagnosis not present

## 2021-02-03 DIAGNOSIS — H0102A Squamous blepharitis right eye, upper and lower eyelids: Secondary | ICD-10-CM | POA: Diagnosis not present

## 2021-02-11 DIAGNOSIS — N2889 Other specified disorders of kidney and ureter: Secondary | ICD-10-CM | POA: Diagnosis not present

## 2021-02-11 DIAGNOSIS — D649 Anemia, unspecified: Secondary | ICD-10-CM | POA: Diagnosis not present

## 2021-03-14 DIAGNOSIS — M549 Dorsalgia, unspecified: Secondary | ICD-10-CM | POA: Diagnosis not present

## 2021-03-14 DIAGNOSIS — M5430 Sciatica, unspecified side: Secondary | ICD-10-CM | POA: Diagnosis not present

## 2021-03-18 ENCOUNTER — Other Ambulatory Visit: Payer: Self-pay | Admitting: Internal Medicine

## 2021-03-18 DIAGNOSIS — M5430 Sciatica, unspecified side: Secondary | ICD-10-CM

## 2021-03-18 DIAGNOSIS — M549 Dorsalgia, unspecified: Secondary | ICD-10-CM

## 2021-03-29 ENCOUNTER — Telehealth: Payer: Self-pay | Admitting: Cardiology

## 2021-03-29 NOTE — Telephone Encounter (Signed)
Pt called stating the nurse that review her medication suggested she change chlorthalidone from 12.5 mg to 25 mg due to increase BP.  151/98 116/71 77 152/93 72 150/72 139/89 143/83 75 145/88 70 107/65 83  149/94 143/92 148/90 69 125/80 76 157/89 77 180/106 158/92  Will forward to MD for recommendations.

## 2021-03-29 NOTE — Telephone Encounter (Signed)
Pt c/o medication issue:  1. Name of Medication: chlorthalidone (HYGROTON) 25 MG tablet  2. How are you currently taking this medication (dosage and times per day)? Take 1/2 tablet ( 12.5 mg ) daily  3. Are you having a reaction (difficulty breathing--STAT)? no  4. What is your medication issue? Pts PCP is wanting her to change the way she is taking the above medication.. pt is not willing to do so without first speaking with Dr. Percival Spanish... please advise /// pt is going to vote so will not be home for a while

## 2021-03-31 MED ORDER — CHLORTHALIDONE 25 MG PO TABS: 25.0000 mg | ORAL_TABLET | Freq: Every day | ORAL | 3 refills | Status: DC

## 2021-03-31 NOTE — Telephone Encounter (Signed)
Left message to call back  Minus Breeding, MD  You 44 minutes ago (3:33 PM)   Although the BP is a little labile I think this sounds correct.  Increase to 25 mg po daily.

## 2021-03-31 NOTE — Telephone Encounter (Signed)
Pt updated and verbalized understanding Medication list updated 

## 2021-04-04 ENCOUNTER — Ambulatory Visit: Payer: Medicare Other | Admitting: Physician Assistant

## 2021-04-06 ENCOUNTER — Other Ambulatory Visit: Payer: Self-pay

## 2021-04-06 ENCOUNTER — Ambulatory Visit
Admission: RE | Admit: 2021-04-06 | Discharge: 2021-04-06 | Disposition: A | Discharge status: Home / Self Care | Payer: Medicare Other | Source: Ambulatory Visit | Attending: Internal Medicine | Admitting: Internal Medicine

## 2021-04-06 DIAGNOSIS — M47816 Spondylosis without myelopathy or radiculopathy, lumbar region: Secondary | ICD-10-CM | POA: Diagnosis not present

## 2021-04-06 DIAGNOSIS — M48061 Spinal stenosis, lumbar region without neurogenic claudication: Secondary | ICD-10-CM | POA: Diagnosis not present

## 2021-04-06 DIAGNOSIS — M545 Low back pain, unspecified: Secondary | ICD-10-CM | POA: Diagnosis not present

## 2021-04-06 DIAGNOSIS — M549 Dorsalgia, unspecified: Secondary | ICD-10-CM

## 2021-04-06 DIAGNOSIS — M5430 Sciatica, unspecified side: Secondary | ICD-10-CM

## 2021-04-06 DIAGNOSIS — R200 Anesthesia of skin: Secondary | ICD-10-CM | POA: Diagnosis not present

## 2021-04-07 DIAGNOSIS — R200 Anesthesia of skin: Secondary | ICD-10-CM | POA: Diagnosis not present

## 2021-04-07 DIAGNOSIS — M5126 Other intervertebral disc displacement, lumbar region: Secondary | ICD-10-CM | POA: Diagnosis not present

## 2021-04-12 DIAGNOSIS — M5116 Intervertebral disc disorders with radiculopathy, lumbar region: Secondary | ICD-10-CM | POA: Diagnosis not present

## 2021-04-12 DIAGNOSIS — R030 Elevated blood-pressure reading, without diagnosis of hypertension: Secondary | ICD-10-CM | POA: Diagnosis not present

## 2021-04-20 DIAGNOSIS — E039 Hypothyroidism, unspecified: Secondary | ICD-10-CM | POA: Diagnosis not present

## 2021-04-20 DIAGNOSIS — I10 Essential (primary) hypertension: Secondary | ICD-10-CM | POA: Diagnosis not present

## 2021-04-20 DIAGNOSIS — J45909 Unspecified asthma, uncomplicated: Secondary | ICD-10-CM | POA: Diagnosis not present

## 2021-04-20 DIAGNOSIS — E782 Mixed hyperlipidemia: Secondary | ICD-10-CM | POA: Diagnosis not present

## 2021-04-28 DIAGNOSIS — J45909 Unspecified asthma, uncomplicated: Secondary | ICD-10-CM | POA: Diagnosis not present

## 2021-04-28 DIAGNOSIS — I10 Essential (primary) hypertension: Secondary | ICD-10-CM | POA: Diagnosis not present

## 2021-04-28 DIAGNOSIS — E039 Hypothyroidism, unspecified: Secondary | ICD-10-CM | POA: Diagnosis not present

## 2021-04-28 DIAGNOSIS — E782 Mixed hyperlipidemia: Secondary | ICD-10-CM | POA: Diagnosis not present

## 2021-05-03 DIAGNOSIS — M5416 Radiculopathy, lumbar region: Secondary | ICD-10-CM | POA: Diagnosis not present

## 2021-05-05 NOTE — Progress Notes (Signed)
Cardiology Office Note   Date:  05/06/2021   ID:  Kerri [name], DOB 28-Feb-1930, MRN [id number]  PCP:  Lavone Orn, MD  Cardiologist:   Minus Breeding, MD   Chief Complaint  Patient presents with   Back Pain      History of Present Illness: Kerri [name] is a 87 y.o. female who presents for ongoing assessment and management of hypertension, palpitations, with other history to include cluster headaches.  She has no new acute cardiovascular complaints.  She is keeping her blood pressure diary and her blood pressures have actually been well controlled.  It was running low but it actually is improved now.  She is not having any presyncope or syncope.  Her biggest problems have been orthopedic he is having lots of back pain and requiring injections.  Past Medical History:  Diagnosis Date   Adenomatous colon polyp    Asthma    Basal cell carcinoma 03/13/2017   left forehead - CX3 + cautery + 5FU   CAD (coronary artery disease)    Chest CTA 10/07: 40% or less pLAD;  ETT 8/09: negative   Carotid bruit    Chronic back pain    Depression    Diverticulosis    DJD (degenerative joint disease)    Dyslipidemia    Dysrhythmia    pvcs   Fatty liver    GERD (gastroesophageal reflux disease)    HTN (hypertension)    Hypothyroidism    IBS (irritable bowel syndrome)    Internal hemorrhoid    Melanoma ([place])    metastatic; left leg; s/p multiple excisions   Migraine    Osteoporosis    Spinal stenosis    Squamous cell carcinoma of skin 09/02/2015   mid chest - CX3 + 5FU   Squamous cell carcinoma of skin 11/07/2017   left wrist - tx p bx   Squamous cell carcinoma of skin 12/11/2017   left hand - tx p bx    Past Surgical History:  Procedure Laterality Date   BACK SURGERY     BLEPHAROPLASTY     BUNIONECTOMY WITH HAMMERTOE RECONSTRUCTION Right 01/16/2013   Procedure: RIGHT Barbie Banner OSTEOTOMY WITH SIMPLE BUNIONECTOMY AND SECOND HAMMER TOE CORRECTION;  Surgeon: Wylene Simmer, MD;  Location:  [facility];  Service: Orthopedics;  Laterality: Right;   CARPAL TUNNEL RELEASE     KNEE ARTHROSCOPY Right    LUMBAR LAMINECTOMY/DECOMPRESSION MICRODISCECTOMY N/A 11/13/2013   Procedure: LUMBAR TWO-THREE,LUMBAR THREE-FOUR,LUMBAR FOUR-FIVE LAMINECTOMY AND FORAMINOTOMY;  Surgeon: Ophelia Charter, MD;  Location: [facility];  Service: Neurosurgery;  Laterality: N/A;   MELANOMA EXCISION Left 09/19/2012   Procedure: WIDE EXCISION METASTATIC MELANOMA LEFT MEDIAL THIGH;  Surgeon: Zenovia Jarred, MD;  Location: [facility];  Service: General;  Laterality: Left;   ROTATOR CUFF REPAIR     SHOULDER SURGERY     TOE SURGERY     TOTAL HIP ARTHROPLASTY  09/04/2011   Procedure: TOTAL HIP ARTHROPLASTY;  Surgeon: Gearlean Alf, MD;  [facility];  Service: Orthopedics;  Laterality: Left;   TOTAL HIP REVISION Left 11/27/2018   Procedure: Left hip femoral revision;  Surgeon: Gaynelle Arabian, MD;  [facility];  Service: Orthopedics;  Laterality: Left;  116min   TOTAL KNEE ARTHROPLASTY Right 10/06/2019   Procedure: TOTAL KNEE ARTHROPLASTY;  Surgeon: Gaynelle Arabian, MD;  [facility];  Service: Orthopedics;  Laterality: Right;  30min     Current Outpatient Medications  Medication Sig Dispense Refill  acetaminophen (TYLENOL) 500 MG tablet Take 500 mg by mouth daily.     alendronate (FOSAMAX) 70 MG tablet Take 70 mg by mouth every 7 (seven) days.     Biotin 5 MG CAPS Take 5 mg by mouth daily.     calcium carbonate (OS-CAL) 1250 (500 Ca) MG chewable tablet Chew 2 tablets by mouth daily.      chlorthalidone (HYGROTON) 25 MG tablet Take 1 tablet (25 mg total) by mouth daily. 90 tablet 3   Cholecalciferol (VITAMIN D3) 50 MCG (2000 UT) TABS Take 2,000 Units by mouth daily.     estradiol (ESTRACE) 0.1 MG/GM vaginal cream Place 1 Applicatorful vaginally every other day.      Eyelid Cleansers (AVENOVA) 0.01 % SOLN Apply topically.     ferrous sulfate 325 (65 FE) MG tablet Take  325 mg by mouth daily with breakfast.     fluticasone (FLONASE) 50 MCG/ACT nasal spray Place 2 sprays into both nostrils every morning.      gabapentin (NEURONTIN) 300 MG capsule Take 300 mg by mouth 2 (two) times daily.      levothyroxine (SYNTHROID, LEVOTHROID) 75 MCG tablet Take 75 mcg by mouth daily with breakfast.     loratadine (CLARITIN) 10 MG tablet Take 10 mg by mouth daily.     naproxen sodium (ALEVE) 220 MG tablet Take by mouth.     polyethylene glycol powder (GLYCOLAX/MIRALAX) 17 GM/SCOOP powder Take 8.5 g by mouth daily.      potassium chloride SA (KLOR-CON) 10 MEQ tablet Take 1 tablet (10 mEq total) by mouth daily. 30 tablet 2   Probiotic Product (PROBIOTIC PO) Take 1 capsule by mouth daily.     rosuvastatin (CRESTOR) 10 MG tablet Take 10 mg by mouth at bedtime.     tretinoin (RETIN-A) 0.05 % cream Apply 1 application topically every other day.      trimethoprim (TRIMPEX) 100 MG tablet Take 100 mg by mouth daily. UTI prevention     vitamin B-12 (CYANOCOBALAMIN) 500 MCG tablet Take 500 mcg by mouth daily.     No current facility-administered medications for this visit.    Allergies:   Buprenorphine hcl, Codeine, Morphine and related, Tramadol, Adhesive [tape], Hydrocodone-acetaminophen, Lactose intolerance (gi), and Oxycodone    ROS:  Please see the history of present illness.   Otherwise, review of systems are positive for constipation.   All other systems are reviewed and negative.    PHYSICAL EXAM: VS:  BP 100/70 (BP Location: Left Arm, Patient Position: Sitting, Cuff Size: Normal)    Pulse 78    Ht 5' 5.5" (1.664 m)    Wt 124 lb (56.2 kg)    BMI 20.32 kg/m  , BMI Body mass index is 20.32 kg/m. GENERAL:  Well appearing NECK:  No jugular venous distention, waveform within normal limits, carotid upstroke brisk and symmetric, no bruits, no thyromegaly LUNGS:  Clear to auscultation bilaterally CHEST:  Unremarkable HEART:  PMI not displaced or sustained,S1 and S2 within  normal limits, no S3, no S4, no clicks, no rubs, no murmurs ABD:  Flat, positive bowel sounds normal in frequency in pitch, no bruits, no rebound, no guarding, no midline pulsatile mass, no hepatomegaly, no splenomegaly EXT:  2 plus pulses throughout, no edema, no cyanosis no clubbing   EKG:  EKG is  ordered today. Sinus rhythm, rate 78, axis within normal limits, intervals within normal limits, no acute changes.  Recent Labs: 06/21/2020: ALT 8; BUN 12; Creatinine, Ser 0.84; Hemoglobin 12.2;  Platelets 222.0; Potassium 4.0; Sodium 138    Lipid Panel No results found for: CHOL, TRIG, HDL, CHOLHDL, VLDL, LDLCALC, LDLDIRECT    Wt Readings from Last 3 Encounters:  05/06/21 124 lb (56.2 kg)  02/02/21 127 lb (57.6 kg)  08/25/20 129 lb (58.5 kg)      Other studies Reviewed: Additional studies/ records that were reviewed today include:   Labs from earlier this year. . Review of the above records demonstrates:  Please see elsewhere in the note.     ASSESSMENT AND PLAN:   Syncope:   She is had no syncope.  He has had no falls.  She is very careful.  No change in therapy.  Hypertension:    The blood pressure is well controlled and reading up slightly from some low blood pressures that she had.  I am happy with slightly higher blood pressures to avoid any presyncope or falls.    Current medicines are reviewed at length with the patient today.  The patient does not have concerns regarding medicines.  The following changes have been made: None  Labs/ tests ordered today include:  .  None  Orders Placed This Encounter  Procedures   EKG 12-Lead       Disposition:   FU with me in 6 months   Signed, Minus Breeding, MD  05/06/2021 2:40 PM    [facility]

## 2021-05-06 ENCOUNTER — Ambulatory Visit (INDEPENDENT_AMBULATORY_CARE_PROVIDER_SITE_OTHER): Payer: Medicare Other | Admitting: Cardiology

## 2021-05-06 ENCOUNTER — Other Ambulatory Visit: Payer: Self-pay

## 2021-05-06 ENCOUNTER — Encounter: Payer: Self-pay | Admitting: Cardiology

## 2021-05-06 VITALS — BP 100/70 | HR 78 | Ht 65.5 in | Wt 124.0 lb

## 2021-05-06 DIAGNOSIS — R55 Syncope and collapse: Secondary | ICD-10-CM | POA: Diagnosis not present

## 2021-05-06 NOTE — Patient Instructions (Signed)
Medication Instructions:  Your Physician recommend you continue on your current medication as directed.    *If you need a refill on your cardiac medications before your next appointment, please call your pharmacy*   Follow-Up: At [facility], you and your health needs are our priority.  As part of our continuing mission to provide you with exceptional heart care, we have created designated Provider Care Teams.  These Care Teams include your primary Cardiologist (physician) and Advanced Practice Providers (APPs -  Physician Assistants and Nurse Practitioners) who all work together to provide you with the care you need, when you need it.  We recommend signing up for the patient portal called "MyChart".  Sign up information is provided on this After Visit Summary.  MyChart is used to connect with patients for Virtual Visits (Telemedicine).  Patients are able to view lab/test results, encounter notes, upcoming appointments, etc.  Non-urgent messages can be sent to your provider as well.   To learn more about what you can do with MyChart, go to [email or web address].    Your next appointment:   6 month(s)  The format for your next appointment:   In Person  Provider:   Minus Breeding, MD

## 2021-05-11 DIAGNOSIS — M5416 Radiculopathy, lumbar region: Secondary | ICD-10-CM | POA: Diagnosis not present

## 2021-05-11 DIAGNOSIS — M961 Postlaminectomy syndrome, not elsewhere classified: Secondary | ICD-10-CM | POA: Diagnosis not present

## 2021-05-12 ENCOUNTER — Other Ambulatory Visit: Payer: Self-pay | Admitting: Physical Medicine and Rehabilitation

## 2021-05-12 ENCOUNTER — Other Ambulatory Visit (HOSPITAL_COMMUNITY): Payer: Self-pay | Admitting: Physical Medicine and Rehabilitation

## 2021-05-12 DIAGNOSIS — M5416 Radiculopathy, lumbar region: Secondary | ICD-10-CM

## 2021-05-12 DIAGNOSIS — R634 Abnormal weight loss: Secondary | ICD-10-CM

## 2021-05-12 DIAGNOSIS — M79604 Pain in right leg: Secondary | ICD-10-CM

## 2021-05-31 ENCOUNTER — Other Ambulatory Visit: Payer: Self-pay

## 2021-05-31 ENCOUNTER — Ambulatory Visit (HOSPITAL_COMMUNITY)
Admission: RE | Admit: 2021-05-31 | Discharge: 2021-05-31 | Disposition: A | Discharge status: Home / Self Care | Payer: Medicare Other | Source: Ambulatory Visit | Attending: Physical Medicine and Rehabilitation | Admitting: Physical Medicine and Rehabilitation

## 2021-05-31 DIAGNOSIS — M5416 Radiculopathy, lumbar region: Secondary | ICD-10-CM | POA: Diagnosis not present

## 2021-05-31 DIAGNOSIS — M79605 Pain in left leg: Secondary | ICD-10-CM | POA: Diagnosis present

## 2021-05-31 DIAGNOSIS — R634 Abnormal weight loss: Secondary | ICD-10-CM | POA: Diagnosis present

## 2021-05-31 DIAGNOSIS — M79604 Pain in right leg: Secondary | ICD-10-CM | POA: Insufficient documentation

## 2021-05-31 MED ORDER — TECHNETIUM TC 99M MEDRONATE IV KIT
22.0000 | PACK | Freq: Once | INTRAVENOUS | Status: AC
  Administered 2021-05-31: 22 via INTRAVENOUS

## 2021-06-07 ENCOUNTER — Telehealth: Payer: Self-pay | Admitting: Cardiology

## 2021-06-07 NOTE — Telephone Encounter (Signed)
Discussed with Dr Percival Spanish and he does want her to continue medications as prescribed  Advised if any further issues or SBP below 100 to call back

## 2021-06-07 NOTE — Telephone Encounter (Signed)
June 07, 2021 Minus Breeding, MD to Me    12:00 PM If she feels OK.  I would not change the meds. BP will go up and down and this one is not too low if she feels OK.  Thanks.   Reviewed recommendations and she stated she has been lightheaded a couple of times She is VERY concerned about theses readings be low before medications

## 2021-06-07 NOTE — Telephone Encounter (Signed)
Spoke with patient and the readings given are prior to morning medications, today blood pressure 108/74  She take Chlorthalidone 25 mg daily and Kt+ 10 meq daily  Stated she is feeling fine  Concerned about blood pressure being low prior to medications  Will forward to Dr Percival Spanish for review

## 2021-06-07 NOTE — Telephone Encounter (Signed)
Pt c/o BP issue: STAT if pt c/o blurred vision, one-sided weakness or slurred speech  1. What are your last 5 BP readings?  120/78 HR 82 110/73 HR 76 108/73 HR 73 118/75 HR 75 108/74 HR 80  2. Are you having any other symptoms (ex. Dizziness, headache, blurred vision, passed out)? No symptoms.  Four days she got out of bed and was dizzy.   3. What is your BP issue? Patient is concerned about her BP's readings.  She didn't take her BP medication today.

## 2021-06-14 ENCOUNTER — Telehealth: Payer: Self-pay | Admitting: Physician Assistant

## 2021-06-14 ENCOUNTER — Other Ambulatory Visit: Payer: Self-pay

## 2021-06-14 ENCOUNTER — Ambulatory Visit: Payer: Medicare Other | Admitting: Physician Assistant

## 2021-06-14 ENCOUNTER — Encounter: Payer: Self-pay | Admitting: Physician Assistant

## 2021-06-14 DIAGNOSIS — Z1283 Encounter for screening for malignant neoplasm of skin: Secondary | ICD-10-CM

## 2021-06-14 DIAGNOSIS — L570 Actinic keratosis: Secondary | ICD-10-CM | POA: Diagnosis not present

## 2021-06-14 DIAGNOSIS — L988 Other specified disorders of the skin and subcutaneous tissue: Secondary | ICD-10-CM

## 2021-06-14 MED ORDER — TRETINOIN 0.1 % EX CREA: TOPICAL_CREAM | Freq: Every day | CUTANEOUS | 3 refills | Status: AC

## 2021-06-14 NOTE — Telephone Encounter (Signed)
Patient left message on office voice mail saying that she was returning nurse's phone call.

## 2021-06-15 ENCOUNTER — Encounter: Payer: Self-pay | Admitting: Physician Assistant

## 2021-06-15 NOTE — Progress Notes (Signed)
° °  Follow-Up Visit   Subjective  Kerri [name] is a 88 y.o. female who presents for the following: Annual Exam (No new concerns).   The following portions of the chart were reviewed this encounter and updated as appropriate:  Tobacco   Allergies   Meds   Problems   Med Hx   Surg Hx   Fam Hx       Objective  Well appearing patient in no apparent distress; mood and affect are within normal limits.  All skin waist up examined.  Head - Anterior (Face) Fine lines.   Left Breast (3), Left Upper Arm - Anterior (2) Erythematous patches with gritty scale.   Assessment & Plan  Wrinkles Head - Anterior (Face)  tretinoin (RETIN-A) 0.1 % cream - Head - Anterior (Face) Apply topically at bedtime.  Actinic keratosis (5) Left Upper Arm - Anterior (2); Left Breast (3)  Destruction of lesion - Left Breast, Left Upper Arm - Anterior Complexity: simple   Destruction method: cryotherapy   Informed consent: discussed and consent obtained   Timeout:  patient name, date of birth, surgical site, and procedure verified Lesion destroyed using liquid nitrogen: Yes   Cryotherapy cycles:  3 Outcome: patient tolerated procedure well with no complications     No atypical nevi noted at the time of the visit.  I, Claire Bridge, PA-C, have reviewed all documentation's for this visit.  The documentation on 06/15/21 for the exam, diagnosis, procedures and orders are all accurate and complete.

## 2021-06-15 NOTE — Telephone Encounter (Signed)
Called patient no answer left message letting her know that I sent in prescription to her pharmacy.

## 2021-06-17 DIAGNOSIS — F419 Anxiety disorder, unspecified: Secondary | ICD-10-CM | POA: Insufficient documentation

## 2021-06-21 ENCOUNTER — Other Ambulatory Visit (HOSPITAL_COMMUNITY): Payer: Self-pay | Admitting: Otolaryngology

## 2021-06-21 ENCOUNTER — Other Ambulatory Visit: Payer: Self-pay | Admitting: Otolaryngology

## 2021-06-21 DIAGNOSIS — R1310 Dysphagia, unspecified: Secondary | ICD-10-CM

## 2021-06-29 ENCOUNTER — Telehealth: Payer: Self-pay | Admitting: Physician Assistant

## 2021-06-29 MED ORDER — CORDRAN 4 MCG/SQCM EX TAPE: 1.0000 | MEDICATED_TAPE | Freq: Every day | CUTANEOUS | 3 refills | Status: DC

## 2021-06-29 NOTE — Addendum Note (Signed)
Addended by: Ailene Rud on: 06/29/2021 03:42 PM   Modules accepted: Orders

## 2021-06-29 NOTE — Telephone Encounter (Signed)
Patient is calling to get refills on Cordran Tape for her nose and tretinoin (RETIN-A) 0.1 % cream [[id number]  Patient wants to use mail in pharmacy and very persistent that Ander Purpura knows the pharmacy name

## 2021-06-29 NOTE — Telephone Encounter (Signed)
Phone call to patient to let her know we sent her cordran tape into her pharmacy.

## 2021-06-29 NOTE — Telephone Encounter (Signed)
Left message for patient to call back  

## 2021-07-13 ENCOUNTER — Ambulatory Visit: Payer: Medicare Other | Admitting: Physician Assistant

## 2021-07-27 DIAGNOSIS — H0102A Squamous blepharitis right eye, upper and lower eyelids: Secondary | ICD-10-CM | POA: Diagnosis not present

## 2021-07-27 DIAGNOSIS — H0102B Squamous blepharitis left eye, upper and lower eyelids: Secondary | ICD-10-CM | POA: Diagnosis not present

## 2021-07-27 DIAGNOSIS — H16223 Keratoconjunctivitis sicca, not specified as Sjogren's, bilateral: Secondary | ICD-10-CM | POA: Diagnosis not present

## 2021-07-27 DIAGNOSIS — H00021 Hordeolum internum right upper eyelid: Secondary | ICD-10-CM | POA: Diagnosis not present

## 2021-08-08 ENCOUNTER — Ambulatory Visit (HOSPITAL_COMMUNITY): Payer: Medicare Other

## 2021-08-08 ENCOUNTER — Encounter (HOSPITAL_COMMUNITY): Payer: Self-pay

## 2021-08-08 ENCOUNTER — Ambulatory Visit (HOSPITAL_COMMUNITY): Admission: RE | Admit: 2021-08-08 | Payer: Medicare Other | Source: Ambulatory Visit

## 2021-08-09 ENCOUNTER — Inpatient Hospital Stay (HOSPITAL_COMMUNITY)
Admission: EM | Admit: 2021-08-09 | Discharge: 2021-08-21 | DRG: 956 | Disposition: A | Discharge status: Skilled Nursing Facility | Payer: Medicare Other | Attending: Internal Medicine | Admitting: Internal Medicine

## 2021-08-09 ENCOUNTER — Emergency Department (HOSPITAL_COMMUNITY): Payer: Medicare Other

## 2021-08-09 ENCOUNTER — Encounter (HOSPITAL_COMMUNITY): Payer: Self-pay | Admitting: Emergency Medicine

## 2021-08-09 DIAGNOSIS — L89156 Pressure-induced deep tissue damage of sacral region: Secondary | ICD-10-CM | POA: Diagnosis present

## 2021-08-09 DIAGNOSIS — R0602 Shortness of breath: Secondary | ICD-10-CM | POA: Diagnosis not present

## 2021-08-09 DIAGNOSIS — W010XXA Fall on same level from slipping, tripping and stumbling without subsequent striking against object, initial encounter: Secondary | ICD-10-CM | POA: Diagnosis present

## 2021-08-09 DIAGNOSIS — S72142A Displaced intertrochanteric fracture of left femur, initial encounter for closed fracture: Secondary | ICD-10-CM | POA: Diagnosis not present

## 2021-08-09 DIAGNOSIS — S72141A Displaced intertrochanteric fracture of right femur, initial encounter for closed fracture: Secondary | ICD-10-CM | POA: Diagnosis not present

## 2021-08-09 DIAGNOSIS — Y92019 Unspecified place in single-family (private) house as the place of occurrence of the external cause: Secondary | ICD-10-CM

## 2021-08-09 DIAGNOSIS — I639 Cerebral infarction, unspecified: Secondary | ICD-10-CM | POA: Diagnosis not present

## 2021-08-09 DIAGNOSIS — I4729 Other ventricular tachycardia: Secondary | ICD-10-CM | POA: Diagnosis not present

## 2021-08-09 DIAGNOSIS — R4689 Other symptoms and signs involving appearance and behavior: Secondary | ICD-10-CM

## 2021-08-09 DIAGNOSIS — T796XXA Traumatic ischemia of muscle, initial encounter: Secondary | ICD-10-CM | POA: Diagnosis not present

## 2021-08-09 DIAGNOSIS — S72001A Fracture of unspecified part of neck of right femur, initial encounter for closed fracture: Secondary | ICD-10-CM | POA: Diagnosis not present

## 2021-08-09 DIAGNOSIS — D529 Folate deficiency anemia, unspecified: Secondary | ICD-10-CM | POA: Diagnosis not present

## 2021-08-09 DIAGNOSIS — Z20822 Contact with and (suspected) exposure to covid-19: Secondary | ICD-10-CM | POA: Diagnosis not present

## 2021-08-09 DIAGNOSIS — F05 Delirium due to known physiological condition: Secondary | ICD-10-CM | POA: Diagnosis not present

## 2021-08-09 DIAGNOSIS — Z885 Allergy status to narcotic agent status: Secondary | ICD-10-CM

## 2021-08-09 DIAGNOSIS — S72009A Fracture of unspecified part of neck of unspecified femur, initial encounter for closed fracture: Secondary | ICD-10-CM | POA: Diagnosis present

## 2021-08-09 DIAGNOSIS — M4800 Spinal stenosis, site unspecified: Secondary | ICD-10-CM | POA: Diagnosis present

## 2021-08-09 DIAGNOSIS — M19011 Primary osteoarthritis, right shoulder: Secondary | ICD-10-CM | POA: Diagnosis not present

## 2021-08-09 DIAGNOSIS — Z96642 Presence of left artificial hip joint: Secondary | ICD-10-CM | POA: Diagnosis not present

## 2021-08-09 DIAGNOSIS — Z6821 Body mass index (BMI) 21.0-21.9, adult: Secondary | ICD-10-CM

## 2021-08-09 DIAGNOSIS — M179 Osteoarthritis of knee, unspecified: Secondary | ICD-10-CM | POA: Diagnosis not present

## 2021-08-09 DIAGNOSIS — S72141D Displaced intertrochanteric fracture of right femur, subsequent encounter for closed fracture with routine healing: Secondary | ICD-10-CM | POA: Diagnosis not present

## 2021-08-09 DIAGNOSIS — M25551 Pain in right hip: Secondary | ICD-10-CM | POA: Diagnosis not present

## 2021-08-09 DIAGNOSIS — R0989 Other specified symptoms and signs involving the circulatory and respiratory systems: Secondary | ICD-10-CM | POA: Diagnosis present

## 2021-08-09 DIAGNOSIS — E876 Hypokalemia: Secondary | ICD-10-CM | POA: Diagnosis not present

## 2021-08-09 DIAGNOSIS — N179 Acute kidney failure, unspecified: Secondary | ICD-10-CM | POA: Diagnosis not present

## 2021-08-09 DIAGNOSIS — Z79899 Other long term (current) drug therapy: Secondary | ICD-10-CM

## 2021-08-09 DIAGNOSIS — R748 Abnormal levels of other serum enzymes: Secondary | ICD-10-CM | POA: Diagnosis present

## 2021-08-09 DIAGNOSIS — D649 Anemia, unspecified: Secondary | ICD-10-CM

## 2021-08-09 DIAGNOSIS — Z79890 Hormone replacement therapy (postmenopausal): Secondary | ICD-10-CM | POA: Diagnosis not present

## 2021-08-09 DIAGNOSIS — R0902 Hypoxemia: Secondary | ICD-10-CM | POA: Diagnosis not present

## 2021-08-09 DIAGNOSIS — E739 Lactose intolerance, unspecified: Secondary | ICD-10-CM | POA: Diagnosis present

## 2021-08-09 DIAGNOSIS — J9601 Acute respiratory failure with hypoxia: Secondary | ICD-10-CM | POA: Diagnosis not present

## 2021-08-09 DIAGNOSIS — E872 Acidosis, unspecified: Secondary | ICD-10-CM | POA: Diagnosis not present

## 2021-08-09 DIAGNOSIS — K589 Irritable bowel syndrome without diarrhea: Secondary | ICD-10-CM | POA: Diagnosis present

## 2021-08-09 DIAGNOSIS — Z043 Encounter for examination and observation following other accident: Secondary | ICD-10-CM | POA: Diagnosis not present

## 2021-08-09 DIAGNOSIS — Z1612 Extended spectrum beta lactamase (ESBL) resistance: Secondary | ICD-10-CM | POA: Diagnosis present

## 2021-08-09 DIAGNOSIS — Z79891 Long term (current) use of opiate analgesic: Secondary | ICD-10-CM

## 2021-08-09 DIAGNOSIS — I6782 Cerebral ischemia: Secondary | ICD-10-CM | POA: Diagnosis not present

## 2021-08-09 DIAGNOSIS — I472 Ventricular tachycardia, unspecified: Secondary | ICD-10-CM | POA: Diagnosis present

## 2021-08-09 DIAGNOSIS — E43 Unspecified severe protein-calorie malnutrition: Secondary | ICD-10-CM | POA: Diagnosis not present

## 2021-08-09 DIAGNOSIS — R519 Headache, unspecified: Secondary | ICD-10-CM | POA: Diagnosis not present

## 2021-08-09 DIAGNOSIS — S72101D Unspecified trochanteric fracture of right femur, subsequent encounter for closed fracture with routine healing: Secondary | ICD-10-CM | POA: Diagnosis not present

## 2021-08-09 DIAGNOSIS — Z85820 Personal history of malignant melanoma of skin: Secondary | ICD-10-CM

## 2021-08-09 DIAGNOSIS — F32A Depression, unspecified: Secondary | ICD-10-CM | POA: Diagnosis not present

## 2021-08-09 DIAGNOSIS — Z7983 Long term (current) use of bisphosphonates: Secondary | ICD-10-CM

## 2021-08-09 DIAGNOSIS — Z886 Allergy status to analgesic agent status: Secondary | ICD-10-CM

## 2021-08-09 DIAGNOSIS — R079 Chest pain, unspecified: Secondary | ICD-10-CM | POA: Diagnosis not present

## 2021-08-09 DIAGNOSIS — R778 Other specified abnormalities of plasma proteins: Secondary | ICD-10-CM | POA: Diagnosis present

## 2021-08-09 DIAGNOSIS — J45909 Unspecified asthma, uncomplicated: Secondary | ICD-10-CM | POA: Diagnosis present

## 2021-08-09 DIAGNOSIS — M542 Cervicalgia: Secondary | ICD-10-CM | POA: Diagnosis not present

## 2021-08-09 DIAGNOSIS — E039 Hypothyroidism, unspecified: Secondary | ICD-10-CM | POA: Diagnosis present

## 2021-08-09 DIAGNOSIS — K760 Fatty (change of) liver, not elsewhere classified: Secondary | ICD-10-CM | POA: Diagnosis present

## 2021-08-09 DIAGNOSIS — Z96651 Presence of right artificial knee joint: Secondary | ICD-10-CM | POA: Diagnosis present

## 2021-08-09 DIAGNOSIS — Z01810 Encounter for preprocedural cardiovascular examination: Secondary | ICD-10-CM | POA: Diagnosis not present

## 2021-08-09 DIAGNOSIS — R4182 Altered mental status, unspecified: Secondary | ICD-10-CM | POA: Diagnosis not present

## 2021-08-09 DIAGNOSIS — M549 Dorsalgia, unspecified: Secondary | ICD-10-CM | POA: Diagnosis present

## 2021-08-09 DIAGNOSIS — E860 Dehydration: Secondary | ICD-10-CM | POA: Diagnosis not present

## 2021-08-09 DIAGNOSIS — N390 Urinary tract infection, site not specified: Secondary | ICD-10-CM | POA: Diagnosis not present

## 2021-08-09 DIAGNOSIS — I6340 Cerebral infarction due to embolism of unspecified cerebral artery: Secondary | ICD-10-CM | POA: Diagnosis not present

## 2021-08-09 DIAGNOSIS — E785 Hyperlipidemia, unspecified: Secondary | ICD-10-CM | POA: Diagnosis not present

## 2021-08-09 DIAGNOSIS — D62 Acute posthemorrhagic anemia: Secondary | ICD-10-CM | POA: Diagnosis not present

## 2021-08-09 DIAGNOSIS — I2510 Atherosclerotic heart disease of native coronary artery without angina pectoris: Secondary | ICD-10-CM | POA: Diagnosis not present

## 2021-08-09 DIAGNOSIS — I248 Other forms of acute ischemic heart disease: Secondary | ICD-10-CM | POA: Diagnosis not present

## 2021-08-09 DIAGNOSIS — I493 Ventricular premature depolarization: Secondary | ICD-10-CM | POA: Diagnosis present

## 2021-08-09 DIAGNOSIS — G8911 Acute pain due to trauma: Secondary | ICD-10-CM | POA: Diagnosis not present

## 2021-08-09 DIAGNOSIS — Z4789 Encounter for other orthopedic aftercare: Secondary | ICD-10-CM | POA: Diagnosis not present

## 2021-08-09 DIAGNOSIS — I10 Essential (primary) hypertension: Secondary | ICD-10-CM | POA: Diagnosis present

## 2021-08-09 DIAGNOSIS — G43909 Migraine, unspecified, not intractable, without status migrainosus: Secondary | ICD-10-CM | POA: Diagnosis present

## 2021-08-09 DIAGNOSIS — S301XXA Contusion of abdominal wall, initial encounter: Secondary | ICD-10-CM | POA: Diagnosis not present

## 2021-08-09 DIAGNOSIS — K219 Gastro-esophageal reflux disease without esophagitis: Secondary | ICD-10-CM | POA: Diagnosis present

## 2021-08-09 DIAGNOSIS — M1711 Unilateral primary osteoarthritis, right knee: Secondary | ICD-10-CM | POA: Diagnosis not present

## 2021-08-09 DIAGNOSIS — R54 Age-related physical debility: Secondary | ICD-10-CM | POA: Diagnosis present

## 2021-08-09 DIAGNOSIS — R2990 Unspecified symptoms and signs involving the nervous system: Secondary | ICD-10-CM | POA: Diagnosis not present

## 2021-08-09 DIAGNOSIS — I6381 Other cerebral infarction due to occlusion or stenosis of small artery: Secondary | ICD-10-CM | POA: Diagnosis not present

## 2021-08-09 DIAGNOSIS — G9341 Metabolic encephalopathy: Secondary | ICD-10-CM | POA: Diagnosis not present

## 2021-08-09 DIAGNOSIS — R410 Disorientation, unspecified: Secondary | ICD-10-CM | POA: Diagnosis not present

## 2021-08-09 DIAGNOSIS — M6282 Rhabdomyolysis: Secondary | ICD-10-CM | POA: Diagnosis not present

## 2021-08-09 DIAGNOSIS — M810 Age-related osteoporosis without current pathological fracture: Secondary | ICD-10-CM | POA: Diagnosis present

## 2021-08-09 DIAGNOSIS — I350 Nonrheumatic aortic (valve) stenosis: Secondary | ICD-10-CM | POA: Diagnosis not present

## 2021-08-09 DIAGNOSIS — G8929 Other chronic pain: Secondary | ICD-10-CM | POA: Diagnosis present

## 2021-08-09 DIAGNOSIS — Z8249 Family history of ischemic heart disease and other diseases of the circulatory system: Secondary | ICD-10-CM

## 2021-08-09 DIAGNOSIS — R627 Adult failure to thrive: Secondary | ICD-10-CM

## 2021-08-09 DIAGNOSIS — Z888 Allergy status to other drugs, medicaments and biological substances status: Secondary | ICD-10-CM

## 2021-08-09 DIAGNOSIS — R930 Abnormal findings on diagnostic imaging of skull and head, not elsewhere classified: Secondary | ICD-10-CM | POA: Diagnosis not present

## 2021-08-09 DIAGNOSIS — W19XXXA Unspecified fall, initial encounter: Secondary | ICD-10-CM | POA: Diagnosis not present

## 2021-08-09 DIAGNOSIS — I6389 Other cerebral infarction: Secondary | ICD-10-CM

## 2021-08-09 DIAGNOSIS — Z87891 Personal history of nicotine dependence: Secondary | ICD-10-CM

## 2021-08-09 DIAGNOSIS — B9620 Unspecified Escherichia coli [E. coli] as the cause of diseases classified elsewhere: Secondary | ICD-10-CM | POA: Diagnosis present

## 2021-08-09 LAB — CBC WITH DIFFERENTIAL/PLATELET
Abs Immature Granulocytes: 0.14 10*3/uL — ABNORMAL HIGH (ref 0.00–0.07)
Basophils Absolute: 0 10*3/uL (ref 0.0–0.1)
Basophils Relative: 0 %
Eosinophils Absolute: 0 10*3/uL (ref 0.0–0.5)
Eosinophils Relative: 0 %
HCT: 38.5 % (ref 36.0–46.0)
Hemoglobin: 12.6 g/dL (ref 12.0–15.0)
Immature Granulocytes: 1 %
Lymphocytes Relative: 9 %
Lymphs Abs: 1.4 10*3/uL (ref 0.7–4.0)
MCH: 28.3 pg (ref 26.0–34.0)
MCHC: 32.7 g/dL (ref 30.0–36.0)
MCV: 86.3 fL (ref 80.0–100.0)
Monocytes Absolute: 1.7 10*3/uL — ABNORMAL HIGH (ref 0.1–1.0)
Monocytes Relative: 11 %
Neutro Abs: 12.4 10*3/uL — ABNORMAL HIGH (ref 1.7–7.7)
Neutrophils Relative %: 79 %
Platelets: 307 10*3/uL (ref 150–400)
RBC: 4.46 MIL/uL (ref 3.87–5.11)
RDW: 14 % (ref 11.5–15.5)
WBC: 15.7 10*3/uL — ABNORMAL HIGH (ref 4.0–10.5)
nRBC: 0 % (ref 0.0–0.2)

## 2021-08-09 LAB — COMPREHENSIVE METABOLIC PANEL
ALT: 69 U/L — ABNORMAL HIGH (ref 0–44)
AST: 103 U/L — ABNORMAL HIGH (ref 15–41)
Albumin: 3.7 g/dL (ref 3.5–5.0)
Alkaline Phosphatase: 83 U/L (ref 38–126)
Anion gap: 15 (ref 5–15)
BUN: 59 mg/dL — ABNORMAL HIGH (ref 8–23)
CO2: 24 mmol/L (ref 22–32)
Calcium: 8.7 mg/dL — ABNORMAL LOW (ref 8.9–10.3)
Chloride: 103 mmol/L (ref 98–111)
Creatinine, Ser: 0.81 mg/dL (ref 0.44–1.00)
GFR, Estimated: >60 mL/min (ref >60)
Glucose, Bld: 126 mg/dL — ABNORMAL HIGH (ref 70–99)
Potassium: 3.2 mmol/L — ABNORMAL LOW (ref 3.5–5.1)
Sodium: 142 mmol/L (ref 135–145)
Total Bilirubin: 1.2 mg/dL (ref 0.3–1.2)
Total Protein: 7.3 g/dL (ref 6.5–8.1)

## 2021-08-09 LAB — CK: Total CK: 1231 U/L — ABNORMAL HIGH (ref 38–234)

## 2021-08-09 MED ORDER — IOHEXOL 300 MG/ML  SOLN
100.0000 mL | Freq: Once | INTRAMUSCULAR | Status: AC | PRN
  Administered 2021-08-09: 100 mL via INTRAVENOUS

## 2021-08-09 MED ORDER — HYDROMORPHONE HCL 1 MG/ML IJ SOLN
0.5000 mg | INTRAMUSCULAR | Status: DC | PRN
  Administered 2021-08-10 (×3): 0.5 mg via INTRAVENOUS
  Filled 2021-08-09 (×3): qty 0.5

## 2021-08-09 MED ORDER — POLYETHYLENE GLYCOL 3350 17 G PO PACK: 17.0000 g | PACK | Freq: Every day | ORAL | Status: DC | PRN

## 2021-08-09 MED ORDER — ONDANSETRON HCL 4 MG/2ML IJ SOLN
4.0000 mg | Freq: Once | INTRAMUSCULAR | Status: AC
Start: 2021-08-09 — End: 2021-08-09
  Administered 2021-08-09: 4 mg via INTRAVENOUS
  Filled 2021-08-09: qty 2

## 2021-08-09 MED ORDER — LEVOTHYROXINE SODIUM 75 MCG PO TABS
75.0000 ug | ORAL_TABLET | Freq: Every day | ORAL | Status: DC
  Administered 2021-08-10 – 2021-08-21 (×12): 75 ug via ORAL
  Filled 2021-08-09 (×13): qty 1

## 2021-08-09 MED ORDER — SODIUM CHLORIDE 0.9 % IV BOLUS
1000.0000 mL | Freq: Once | INTRAVENOUS | Status: AC
  Administered 2021-08-09: 1000 mL via INTRAVENOUS

## 2021-08-09 MED ORDER — SODIUM CHLORIDE 0.9 % IV SOLN: INTRAVENOUS | Status: AC

## 2021-08-09 MED ORDER — HYDROMORPHONE HCL 1 MG/ML IJ SOLN
0.5000 mg | Freq: Once | INTRAMUSCULAR | Status: AC
  Administered 2021-08-09: 0.5 mg via INTRAVENOUS
  Filled 2021-08-09: qty 1

## 2021-08-09 MED ORDER — GABAPENTIN 300 MG PO CAPS
300.0000 mg | ORAL_CAPSULE | Freq: Three times a day (TID) | ORAL | Status: DC
  Administered 2021-08-10 – 2021-08-16 (×14): 300 mg via ORAL
  Filled 2021-08-09 (×19): qty 1

## 2021-08-09 MED ORDER — METHOCARBAMOL 500 MG PO TABS: 500.0000 mg | ORAL_TABLET | Freq: Four times a day (QID) | ORAL | Status: DC | PRN

## 2021-08-09 MED ORDER — POTASSIUM CHLORIDE CRYS ER 20 MEQ PO TBCR
20.0000 meq | EXTENDED_RELEASE_TABLET | Freq: Once | ORAL | Status: DC
  Filled 2021-08-09: qty 1

## 2021-08-09 MED ORDER — METHOCARBAMOL 1000 MG/10ML IJ SOLN
500.0000 mg | Freq: Four times a day (QID) | INTRAVENOUS | Status: DC | PRN
  Filled 2021-08-09: qty 5

## 2021-08-09 NOTE — H&P (View-Only) (Signed)
Reason for Consult: Right hip fracture ?Referring Physician: EDP ? ?Kerri [name] is an 88 y.o. female.  ?HPI: [place] 3 days ago c/o R hip pain. No other injuries from her fall. Currently admitted to medical service in rhabdo and with elevated troponins. Pt is known to our practice and is a pt of Dr. Wynelle Link. ? ?Past Medical History:  ?Diagnosis Date  ? Adenomatous colon polyp   ? Asthma   ? Basal cell carcinoma 03/13/2017  ? left forehead - CX3 + cautery + 5FU  ? CAD (coronary artery disease)   ? Chest CTA 10/07: 40% or less pLAD;  ETT 8/09: negative  ? Carotid bruit   ? Chronic back pain   ? Depression   ? Diverticulosis   ? DJD (degenerative joint disease)   ? Dyslipidemia   ? Dysrhythmia   ? pvcs  ? Fatty liver   ? GERD (gastroesophageal reflux disease)   ? HTN (hypertension)   ? Hypothyroidism   ? IBS (irritable bowel syndrome)   ? Internal hemorrhoid   ? Melanoma ([place])   ? metastatic; left leg; s/p multiple excisions  ? Migraine   ? Osteoporosis   ? Spinal stenosis   ? Squamous cell carcinoma of skin 09/02/2015  ? mid chest - CX3 + 5FU  ? Squamous cell carcinoma of skin 11/07/2017  ? left wrist - tx p bx  ? Squamous cell carcinoma of skin 12/11/2017  ? left hand - tx p bx  ? ? ?Past Surgical History:  ?Procedure Laterality Date  ? BACK SURGERY    ? BLEPHAROPLASTY    ? BUNIONECTOMY WITH HAMMERTOE RECONSTRUCTION Right 01/16/2013  ? Procedure: RIGHT Barbie Banner OSTEOTOMY WITH SIMPLE BUNIONECTOMY AND SECOND HAMMER TOE CORRECTION;  Surgeon: Wylene Simmer, MD;  Location: [facility];  Service: Orthopedics;  Laterality: Right;  ? CARPAL TUNNEL RELEASE    ? KNEE ARTHROSCOPY Right   ? LUMBAR LAMINECTOMY/DECOMPRESSION MICRODISCECTOMY N/A 11/13/2013  ? Procedure: LUMBAR TWO-THREE,LUMBAR THREE-FOUR,LUMBAR FOUR-FIVE LAMINECTOMY AND FORAMINOTOMY;  Surgeon: Ophelia Charter, MD;  Location: [facility];  Service: Neurosurgery;  Laterality: N/A;  ? MELANOMA EXCISION Left 09/19/2012  ? Procedure: WIDE EXCISION METASTATIC  MELANOMA LEFT MEDIAL THIGH;  Surgeon: Zenovia Jarred, MD;  Location: [facility];  Service: General;  Laterality: Left;  ? ROTATOR CUFF REPAIR    ? SHOULDER SURGERY    ? TOE SURGERY    ? TOTAL HIP ARTHROPLASTY  09/04/2011  ? Procedure: TOTAL HIP ARTHROPLASTY;  Surgeon: Gearlean Alf, MD;  Location: [facility];  Service: Orthopedics;  Laterality: Left;  ? TOTAL HIP REVISION Left 11/27/2018  ? Procedure: Left hip femoral revision;  Surgeon: Gaynelle Arabian, MD;  Location: [facility];  Service: Orthopedics;  Laterality: Left;  169mn  ? TOTAL KNEE ARTHROPLASTY Right 10/06/2019  ? Procedure: TOTAL KNEE ARTHROPLASTY;  Surgeon: AGaynelle Arabian MD;  Location: [facility];  Service: Orthopedics;  Laterality: Right;  567m  ? ? ?Family History  ?Problem Relation Age of Onset  ? CAD Mother   ? CAD Father   ? Kidney disease Child   ? ? ?Social History:  reports that she quit smoking about 72 years ago. Her smoking use included cigarettes. She has never used smokeless tobacco. She reports current alcohol use of about 2.0 standard drinks per week. She reports that she does not use drugs. ? ?Allergies:  ?Allergies  ?Allergen Reactions  ? Buprenorphine Hcl Itching and Other (See Comments)  ?  Happened a while  back. Patient noted that any narcotic based medication makes her have a bad reaction.  ? Codeine Nausea And Vomiting and Other (See Comments)  ?  Patient gets sick on stomach  ? Morphine And Related Itching and Other (See Comments)  ?  Happened a while back. Patient noted that any narcotic based medication makes her have a bad reaction.  ? Tramadol Nausea And Vomiting and Other (See Comments)  ?  Confusion ?Can tolerate one tablet at night  ? Adhesive [Tape] Other (See Comments)  ?  Tears skin ?  ? Hydrocodone-Acetaminophen Nausea And Vomiting  ? Lactose Intolerance (Gi) Other (See Comments)  ?  Gi upset ?  ? Oxycodone Nausea And Vomiting  ? ? ?Medications: I have reviewed pt's medications ? ?Results for orders placed or  performed during the hospital encounter of 08/09/21 (from the past 48 hour(s))  ?CBC with Differential     Status: Abnormal  ? Collection Time: 08/09/21  7:06 PM  ?Result Value Ref Range  ? WBC 15.7 (H) 4.0 - 10.5 K/uL  ? RBC 4.46 3.87 - 5.11 MIL/uL  ? Hemoglobin 12.6 12.0 - 15.0 g/dL  ? HCT 38.5 36.0 - 46.0 %  ? MCV 86.3 80.0 - 100.0 fL  ? MCH 28.3 26.0 - 34.0 pg  ? MCHC 32.7 30.0 - 36.0 g/dL  ? RDW 14.0 11.5 - 15.5 %  ? Platelets 307 150 - 400 K/uL  ? nRBC 0.0 0.0 - 0.2 %  ? Neutrophils Relative % 79 %  ? Neutro Abs 12.4 (H) 1.7 - 7.7 K/uL  ? Lymphocytes Relative 9 %  ? Lymphs Abs 1.4 0.7 - 4.0 K/uL  ? Monocytes Relative 11 %  ? Monocytes Absolute 1.7 (H) 0.1 - 1.0 K/uL  ? Eosinophils Relative 0 %  ? Eosinophils Absolute 0.0 0.0 - 0.5 K/uL  ? Basophils Relative 0 %  ? Basophils Absolute 0.0 0.0 - 0.1 K/uL  ? Immature Granulocytes 1 %  ? Abs Immature Granulocytes 0.14 (H) 0.00 - 0.07 K/uL  ?  Comment: Performed at [facility], [place] [address], [place], [place] [address]  ?Comprehensive metabolic panel     Status: Abnormal  ? Collection Time: 08/09/21  7:06 PM  ?Result Value Ref Range  ? Sodium 142 135 - 145 mmol/L  ? Potassium 3.2 (L) 3.5 - 5.1 mmol/L  ? Chloride 103 98 - 111 mmol/L  ? CO2 24 22 - 32 mmol/L  ? Glucose, Bld 126 (H) 70 - 99 mg/dL  ?  Comment: Glucose reference range applies only to samples taken after fasting for at least 8 hours.  ? BUN 59 (H) 8 - 23 mg/dL  ? Creatinine, Ser 0.81 0.44 - 1.00 mg/dL  ? Calcium 8.7 (L) 8.9 - 10.3 mg/dL  ? Total Protein 7.3 6.5 - 8.1 g/dL  ? Albumin 3.7 3.5 - 5.0 g/dL  ? AST 103 (H) 15 - 41 U/L  ? ALT 69 (H) 0 - 44 U/L  ? Alkaline Phosphatase 83 38 - 126 U/L  ? Total Bilirubin 1.2 0.3 - 1.2 mg/dL  ? GFR, Estimated >60 >60 mL/min  ?  Comment: (NOTE) ?Calculated using the CKD-EPI Creatinine Equation (2021) ?  ? Anion gap 15 5 - 15  ?  Comment: Performed at [facility], [place] [address], [place], [place] [address]  ?CK     Status:  Abnormal  ? Collection Time: 08/09/21  7:06 PM  ?Result Value Ref Range  ? Total  CK 1,231 (H) 38 - 234 U/L  ?  Comment: Performed at [facility], [place] [address], [place], [place] [address]  ? ? ?CT Head Wo Contrast ? ?Result Date: 08/09/2021 ?CLINICAL DATA:  Patient here from home reporting fall a couple of days ago (can't remember) has been on floor since fall. Reports right hip pain with bruising. Reports prior replacements. 69mg Fent given in route. EXAM: CT HEAD WITHOUT CONTRAST CT CERVICAL SPINE WITHOUT CONTRAST TECHNIQUE: Multidetector CT imaging of the head and cervical spine was performed following the standard protocol without intravenous contrast. Multiplanar CT image reconstructions of the cervical spine were also generated. RADIATION DOSE REDUCTION: This exam was performed according to the departmental dose-optimization program which includes automated exposure control, adjustment of the mA and/or kV according to patient size and/or use of iterative reconstruction technique. COMPARISON:  None. FINDINGS: CT HEAD FINDINGS BRAIN: BRAIN Cerebral ventricle sizes are concordant with the degree of cerebral volume loss. Patchy and confluent areas of decreased attenuation are noted throughout the deep and periventricular white matter of the cerebral hemispheres bilaterally, compatible with chronic microvascular ischemic disease. No evidence of large-territorial acute infarction. No parenchymal hemorrhage. No mass lesion. No extra-axial collection. No mass effect or midline shift. No hydrocephalus. Basilar cisterns are patent. Vascular: No hyperdense vessel. Atherosclerotic calcifications are present within the cavernous internal carotid arteries. Skull: No acute fracture or focal lesion. Sinuses/Orbits: Paranasal sinuses and mastoid air cells are clear. Bilateral lens replacement. Otherwise the orbits are unremarkable. Other: Bilateral maxillary soft tissue calcifications of unclear etiology  (1:3). No large hematoma formation. CT CERVICAL SPINE FINDINGS Alignment: Grade 1 anterolisthesis of C4 on C5. Skull base and vertebrae: Multilevel degenerative changes of the spine most prominent at the C5-C6 level.

## 2021-08-09 NOTE — Assessment & Plan Note (Signed)
Replace check magnesium level following monitor on telemetry ?

## 2021-08-09 NOTE — Assessment & Plan Note (Signed)
Allow some permissive hypertension for tonight ?

## 2021-08-09 NOTE — H&P (Signed)
? ? ?Kerri [name] TKP:[id number] DOB: 03/16/1930 DOA: 08/09/2021 ?  ?PCP: Lavone Orn, MD   ?Outpatient Specialists:   ?CARDS:  Dr.Hochrein ?  ?Otho Dr. Elmyra Ricks ? ?Patient arrived to ER on 08/09/21 at [facility] ?Referred by Attending Deno Etienne, DO ? ? ?Patient coming from:   ? home Lives alone,     ?   ?Chief Complaint:   ?Chief Complaint  ?Patient presents with  ? Fall  ? Hip Pain  ? ? ?HPI: ?Kerri [name] is a 88 y.o. female with medical history significant of hypertension history of syncope cluster headaches, asthma, CAD, chronic back pain, depression, HLD, GERD, hypothyroidism, irritable bowel syndrome, history of melanoma ?  ? ?Presented with   fall ?She had a fall 5 days  ago and could not get up  ? ?She was going to take a shower tripped on her shoes, and fell down she heard her hip break .  ?She did not have her phone on her ?She fell in the shower did not hit her head, she has had chronic neck pain ?Had severe hip pain she did not drink anything she did not eat. ?Has no hx of dementia ? Saturday Mornihg was the last time she used her phone ?No CP prior to arrival now if asked if she has CP she says she does not know but pt have had IV dilaudid and somewhat confused ?Pt drinks 1 glass of wine on occasion ? ? Initial COVID TEST  ?NEGATIVE  ? ?Lab Results  ?Component Value Date  ? [place] NEGATIVE 08/09/2021  ? [place] NEGATIVE 10/14/2019  ? [place] NEGATIVE 10/02/2019  ? [place] NEGATIVE 11/29/2018  ? ?  ?Regarding pertinent Chronic problems:   ? ? Hyperlipidemia -  on statins [place] ?  ? ? HTN on chlorthalidone last echogram from 2020 showed normal EF ?  CAD  - On  , statin,    ?               -  followed by cardiology ?               -Noted on CAT scan ? ?    ? Hypothyroidism:  ?Lab Results  ?Component Value Date  ? TSH 0.03 (L) 10/26/2006  ? on synthroid ?    ?While in ER: ?  ? ?Found to have Multiple bruises and abrasions as well as elevated CK ?Imaging showed intertrochanteric hip fracture Case was  discussed with orthopedics Dr. Maxie Better who will see patient in consult and discuss further care. ?Patient was given Dilaudid for pain ? ?Ordered ?Pelvis Comminuted and displaced intertrochanteric right femoral fracture ?CT HEAD   NON acute  ?Severe atherosclerotic plaque of the carotid arteries within the ?neck. Consider carotid ultrasound further evaluation. ?CXR -  NON acute ? ?CTabd/pelvis -  nonacute ? ?CTA chest -  nonacute,  no evidence of infiltrate ? ?Following Medications were ordered in ER: ?Medications  ?HYDROmorphone (DILAUDID) injection 0.5 mg (0.5 mg Intravenous Given 08/09/21 1926)  ?ondansetron [facility]) injection 4 mg (4 mg Intravenous Given 08/09/21 1926)  ?sodium chloride 0.9 % bolus 1,000 mL (1,000 mLs Intravenous New Bag/Given 08/09/21 1925)  ?iohexol (OMNIPAQUE) 300 MG/ML solution 100 mL (100 mLs Intravenous Contrast Given 08/09/21 2022)  ?  ?_______________________________________________________ ?ER Provider Called: Orthopedics    Dr. Maxie Better ?They Recommend admit to medicine   ? SEEN in ER ?  ?ED Triage Vitals  ?Enc Vitals Group  ?   BP 08/09/21 1900 (!) 138/91  ?  Pulse Rate 08/09/21 1900 89  ?   Resp 08/09/21 1900 19  ?   Temp 08/09/21 1900 98 ?F (36.7 ?C)  ?   Temp Source 08/09/21 1900 Oral  ?   SpO2 08/09/21 1900 100 %  ?   Weight --   ?   Height --   ?   Head Circumference --   ?   Peak Flow --   ?   Pain Score 08/09/21 1852 10  ?   Pain Loc --   ?   Pain Edu? --   ?   Excl. in [place]? --   ?HYIF(02)@    ? _________________________________________ ?Significant initial  Findings: ?Abnormal Labs Reviewed  ?CBC WITH DIFFERENTIAL/PLATELET - Abnormal; Notable for the following components:  ?    Result Value  ? WBC 15.7 (*)   ? Neutro Abs 12.4 (*)   ? Monocytes Absolute 1.7 (*)   ? Abs Immature Granulocytes 0.14 (*)   ? All other components within normal limits  ?COMPREHENSIVE METABOLIC PANEL - Abnormal; Notable for the following components:  ? Potassium 3.2 (*)   ? Glucose, Bld 126 (*)   ? BUN 59 (*)   ?  Calcium 8.7 (*)   ? AST 103 (*)   ? ALT 69 (*)   ? All other components within normal limits  ?CK - Abnormal; Notable for the following components:  ? Total CK 1,231 (*)   ? All other components within normal limits  ? ?  ?_________________________ ?Troponin  ordered ?ECG: Ordered ?Personally reviewed by me showing: ?HR : 104 ?Rhythm: Sinus tachycardia  w PVC ? nonspecific changes,  ?QTC 498 ?  ?The recent clinical data is shown below. ?Vitals:  ? 08/09/21 1900 08/09/21 1930  ?BP: (!) 138/91 (!) 138/93  ?Pulse: 89 (!) 112  ?Resp: 19 15  ?Temp: 98 ?F (36.7 ?C)   ?TempSrc: Oral   ?SpO2: 100% 100%  ?  ?WBC ? ?   ?Component Value Date/Time  ? WBC 15.7 (H) 08/09/2021 1906  ? LYMPHSABS 1.4 08/09/2021 1906  ? LYMPHSABS 1.9 08/25/2008 1525  ? MONOABS 1.7 (H) 08/09/2021 1906  ? MONOABS 0.5 08/25/2008 1525  ? EOSABS 0.0 08/09/2021 1906  ? EOSABS 0.1 08/25/2008 1525  ? BASOSABS 0.0 08/09/2021 1906  ? BASOSABS 0.0 08/25/2008 1525  ?  ?Lactic Acid, Venous ?   ?Component Value Date/Time  ? LATICACIDVEN 1.1 08/09/2021 2300  ?  ? UA  ordered  ? ?Results for orders placed or performed during the hospital encounter of 08/09/21  ?Resp Panel by RT-PCR (Flu A&B, Covid) Nasopharyngeal Swab     Status: None  ? Collection Time: 08/09/21 11:00 PM  ? Specimen: Nasopharyngeal Swab; Nasopharyngeal(NP) swabs in vial transport medium  ?Result Value Ref Range Status  ? SARS Coronavirus 2 by RT PCR NEGATIVE NEGATIVE Final  ?      ? Influenza A by PCR NEGATIVE NEGATIVE Final  ? Influenza B by PCR NEGATIVE NEGATIVE Final  ?      ? ?  ?_______________________________________________ ?Hospitalist was called for admission for   Closed 2-part intertrochanteric fracture of right femur,  and  Traumatic rhabdomyolysis,   ?   ? ?The following Work up has been ordered so far: ? ?Orders Placed This Encounter  ?Procedures  ? DG Chest Port 1 View  ? CT Head Wo Contrast  ? CT Cervical Spine Wo Contrast  ? CT CHEST ABDOMEN PELVIS W CONTRAST  ? DG Humerus Right  ?  DG HIP UNILAT WITH PELVIS 2-3 VIEWS RIGHT  ? CBC with Differential  ? Comprehensive metabolic panel  ? CK  ? Urinalysis, Routine w reflex microscopic  ? Consult to orthopedic surgery  ? Consult to hospitalist  ?  ? ?OTHER Significant initial  Findings: ? ?labs showing: ? ?  ?Recent Labs  ?Lab 08/09/21 ?1906  ?NA 142  ?K 3.2*  ?CO2 24  ?GLUCOSE 126*  ?BUN 59*  ?CREATININE 0.81  ?CALCIUM 8.7*  ? ? ?Cr  stable,    ?Lab Results  ?Component Value Date  ? CREATININE 0.81 08/09/2021  ? CREATININE 0.84 06/21/2020  ? CREATININE 0.62 10/13/2019  ? ? ?Recent Labs  ?Lab 08/09/21 ?1906  ?AST 103*  ?ALT 69*  ?ALKPHOS 83  ?BILITOT 1.2  ?PROT 7.3  ?ALBUMIN 3.7  ? ?Lab Results  ?Component Value Date  ? CALCIUM 8.7 (L) 08/09/2021  ?  ?Plt: ?Lab Results  ?Component Value Date  ? PLT 307 08/09/2021  ? ? ?  ?   ?Recent Labs  ?Lab 08/09/21 ?1906  ?WBC 15.7*  ?NEUTROABS 12.4*  ?HGB 12.6  ?HCT 38.5  ?MCV 86.3  ?PLT 307  ? ? ?HG/HCT   stable,   ?   ?Component Value Date/Time  ? HGB 12.6 08/09/2021 1906  ? HGB 13.0 02/11/2019 1413  ? HGB 12.4 08/25/2008 1525  ? HCT 38.5 08/09/2021 1906  ? HCT 40.4 02/11/2019 1413  ? HCT 37.5 08/25/2008 1525  ? MCV 86.3 08/09/2021 1906  ? MCV 92 02/11/2019 1413  ? MCV 86.0 08/25/2008 1525  ? ?  ?No results for input(s): LIPASE, AMYLASE in the last 168 hours. ?No results for input(s): AMMONIA in the last 168 hours. ?  ? ?Cardiac Panel (last 3 results) ?Recent Labs  ?  08/09/21 ?1906  ?CKTOTAL 1,231*  ?  ?    ?Cultures: ?   ?Component Value Date/Time  ? SDES  12/06/2018 2323  ?  URINE, RANDOM ?Performed at [facility], [place] [address], [place], [place] [address] ?  ? SPECREQUEST  12/06/2018 2323  ?  NONE ?Performed at [facility], [place] [address], [place], [place] [address] ?  ? CULT (A) 12/06/2018 2323  ?  >=100,000 COLONIES/mL ESCHERICHIA COLI ?Confirmed Extended Spectrum Beta-Lactamase Producer (ESBL).  In bloodstream infections from ESBL organisms, carbapenems are  preferred over piperacillin/tazobactam. They are shown to have a lower risk of mortality. ?  ? REPTSTATUS 12/10/2018 FINAL 12/06/2018 2323  ? ?  ?Radiological Exams on Admission: ?CT Head Wo Contrast ? ?

## 2021-08-09 NOTE — Assessment & Plan Note (Signed)
Continue statin. 

## 2021-08-09 NOTE — Assessment & Plan Note (Signed)
-   Check TSH continue home medications at current dose ° °

## 2021-08-09 NOTE — Assessment & Plan Note (Signed)
Continue statin if able EKG and echo pending results may need cardiac evaluation prior to proceeding to the OR ?

## 2021-08-09 NOTE — ED Provider Notes (Signed)
?[facility]PT ?Provider Note ? ? ?CSN: [id number] ?Arrival date & time: 08/09/21  1835 ? ?  ? ?History ? ?Chief Complaint  ?Patient presents with  ? Fall  ? Hip Pain  ? ? ?Kerri [name] is a 88 y.o. female. ? ?88 yo F with a chief complaints of a fall.  The patient tells me that she lost her balance and then fell onto her right buttock.  Was complaining of severe pain to the area and was unable to get off the ground.  She thinks that she has been on the ground for at least 5 days.  Has not been able to eat or drink.  She is complaining of pain all along her back to her neck and her right upper extremity.   ? ?The history is provided by the patient and the EMS personnel.  ?Fall ? ?Hip Pain ? ? ?  ? ?Home Medications ?Prior to Admission medications   ?Medication Sig Start Date End Date Taking? Authorizing Provider  ?acetaminophen (TYLENOL) 500 MG tablet Take 500 mg by mouth daily.    [provider]  ?alendronate (FOSAMAX) 70 MG tablet Take 70 mg by mouth every 7 (seven) days. 11/15/18   [provider]  ?Biotin 5 MG CAPS Take 5 mg by mouth daily.    [provider]  ?calcium carbonate (OS-CAL) 1250 (500 Ca) MG chewable tablet Chew 2 tablets by mouth daily.     [provider]  ?chlorthalidone (HYGROTON) 25 MG tablet Take 1 tablet (25 mg total) by mouth daily. 03/31/21   Minus Breeding, MD  ?Cholecalciferol (VITAMIN D3) 50 MCG (2000 UT) TABS Take 2,000 Units by mouth daily.    [provider]  ?estradiol (ESTRACE) 0.1 MG/GM vaginal cream Place 1 Applicatorful vaginally every other day.  06/06/19   [provider]  ?Eyelid Cleansers (AVENOVA) 0.01 % SOLN Apply topically. 09/16/19   [provider]  ?ferrous sulfate 325 (65 FE) MG tablet Take 325 mg by mouth daily with breakfast.    [provider]  ?Flurandrenolide (CORDRAN) 4 MCG/SQCM TAPE Apply 1 each topically daily. 06/29/21   Lavonna Monarch, MD  ?fluticasone (FLONASE)  50 MCG/ACT nasal spray Place 2 sprays into both nostrils every morning.  03/29/19   [provider]  ?gabapentin (NEURONTIN) 300 MG capsule Take 300 mg by mouth 2 (two) times daily.     [provider]  ?levothyroxine (SYNTHROID, LEVOTHROID) 75 MCG tablet Take 75 mcg by mouth daily with breakfast.    [provider]  ?loratadine (CLARITIN) 10 MG tablet Take 10 mg by mouth daily.    [provider]  ?naproxen sodium (ALEVE) 220 MG tablet Take by mouth.    [provider]  ?polyethylene glycol powder (GLYCOLAX/MIRALAX) 17 GM/SCOOP powder Take 8.5 g by mouth daily.     [provider]  ?potassium chloride SA (KLOR-CON) 10 MEQ tablet Take 1 tablet (10 mEq total) by mouth daily. 08/25/20   Minus Breeding, MD  ?Probiotic Product (PROBIOTIC PO) Take 1 capsule by mouth daily.    [provider]  ?rosuvastatin (CRESTOR) 10 MG tablet Take 10 mg by mouth at bedtime.    [provider]  ?tapentadol (NUCYNTA) 50 MG tablet Take 50 mg by mouth.    [provider]  ?tretinoin (RETIN-A) 0.05 % cream Apply 1 application topically every other day.     [provider]  ?tretinoin (RETIN-A) 0.1 % cream Apply topically at bedtime. 06/14/21 06/14/22  Sheffield, Kelli R, PA-C  ?trimethoprim (TRIMPEX) 100 MG tablet Take 100 mg by mouth daily. UTI prevention 08/04/19   [provider]  ?vitamin B-12 (CYANOCOBALAMIN) 500 MCG tablet Take 500 mcg by mouth daily.    [provider]  ?   ? ?Allergies    ?Buprenorphine hcl, Codeine, Morphine and related, Tramadol, Adhesive [tape], Hydrocodone-acetaminophen, Lactose intolerance (gi), and Oxycodone   ? ?Review of Systems   ?Review of Systems ? ?Physical Exam ?Updated Vital Signs ?BP (!) 138/93   Pulse (!) 112   Temp 98 ?F (36.7 ?C) (Oral)   Resp 15   SpO2 100%  ?Physical Exam ?Vitals and nursing note reviewed.  ?Constitutional:   ?   General: She is not in acute distress. ?   Appearance: She is  well-developed. She is not diaphoretic.  ?HENT:  ?   Head: Normocephalic and atraumatic.  ?Eyes:  ?   Pupils: Pupils are equal, round, and reactive to light.  ?Cardiovascular:  ?   Rate and Rhythm: Normal rate and regular rhythm.  ?   Heart sounds: No murmur heard. ?  No friction rub. No gallop.  ?Pulmonary:  ?   Effort: Pulmonary effort is normal.  ?   Breath sounds: No wheezing or rales.  ?Abdominal:  ?   General: There is no distension.  ?   Palpations: Abdomen is soft.  ?   Tenderness: There is no abdominal tenderness.  ?Musculoskeletal:     ?   General: Swelling and deformity present. No tenderness.  ?   Cervical back: Normal range of motion and neck supple.  ?   Comments: Pain and deformity to the right hip.  Intact motor and sensation to the right lower extremity.  Warm to the touch.  Cap refill 3 seconds bilaterally.  No obvious pelvic instability.  Mild C-spine pain on palpation.  Pain to the right upper arm without obvious deformity.  ?Skin: ?   General: Skin is warm and dry.  ?Neurological:  ?   Mental Status: She is alert and oriented to person, place, and time.  ?Psychiatric:     ?   Behavior: Behavior normal.  ? ? ?ED Results / Procedures / Treatments   ?Labs ?(all labs ordered are listed, but only abnormal results are displayed) ?Labs Reviewed  ?CBC WITH DIFFERENTIAL/PLATELET - Abnormal; Notable for the following components:  ?    Result Value  ? WBC 15.7 (*)   ? Neutro Abs 12.4 (*)   ? Monocytes Absolute 1.7 (*)   ? Abs Immature Granulocytes 0.14 (*)   ? All other components within normal limits  ?COMPREHENSIVE METABOLIC PANEL - Abnormal; Notable for the following components:  ? Potassium 3.2 (*)   ? Glucose, Bld 126 (*)   ? BUN 59 (*)   ? Calcium 8.7 (*)   ? AST 103 (*)   ? ALT 69 (*)   ? All other components within normal limits  ?CK - Abnormal; Notable for the following components:  ? Total CK 1,231 (*)   ? All other components within normal limits  ?URINALYSIS, ROUTINE W REFLEX MICROSCOPIC   ? ? ?EKG ?None ? ?Radiology ?CT Head Wo Contrast ? ?Result Date: 08/09/2021 ?CLINICAL DATA:  Patient here from home reporting fall a couple of days ago (can't remember) has been on floor since fall. Reports right hip pain with bruising. Reports prior replacements. 20mg Fent given in route. EXAM: CT HEAD WITHOUT CONTRAST CT CERVICAL SPINE WITHOUT CONTRAST TECHNIQUE: Multidetector  CT imaging of the head and cervical spine was performed following the standard protocol without intravenous contrast. Multiplanar CT image reconstructions of the cervical spine were also generated. RADIATION DOSE REDUCTION: This exam was performed according to the departmental dose-optimization program which includes automated exposure control, adjustment of the mA and/or kV according to patient size and/or use of iterative reconstruction technique. COMPARISON:  None. FINDINGS: CT HEAD FINDINGS BRAIN: BRAIN Cerebral ventricle sizes are concordant with the degree of cerebral volume loss. Patchy and confluent areas of decreased attenuation are noted throughout the deep and periventricular white matter of the cerebral hemispheres bilaterally, compatible with chronic microvascular ischemic disease. No evidence of large-territorial acute infarction. No parenchymal hemorrhage. No mass lesion. No extra-axial collection. No mass effect or midline shift. No hydrocephalus. Basilar cisterns are patent. Vascular: No hyperdense vessel. Atherosclerotic calcifications are present within the cavernous internal carotid arteries. Skull: No acute fracture or focal lesion. Sinuses/Orbits: Paranasal sinuses and mastoid air cells are clear. Bilateral lens replacement. Otherwise the orbits are unremarkable. Other: Bilateral maxillary soft tissue calcifications of unclear etiology (1:3). No large hematoma formation. CT CERVICAL SPINE FINDINGS Alignment: Grade 1 anterolisthesis of C4 on C5. Skull base and vertebrae: Multilevel degenerative changes of the spine most  prominent at the C5-C6 level. No acute fracture. No aggressive appearing focal osseous lesion or focal pathologic process. Soft tissues and spinal canal: No prevertebral fluid or swelling. No visible canal he

## 2021-08-09 NOTE — Consult Note (Signed)
Reason for Consult: Right hip fracture ?Referring Physician: EDP ? ?Kerri [name] is an 88 y.o. female.  ?HPI: [place] 3 days ago c/o R hip pain. No other injuries from her fall. Currently admitted to medical service in rhabdo and with elevated troponins. Pt is known to our practice and is a pt of Dr. Wynelle Link. ? ?Past Medical History:  ?Diagnosis Date  ? Adenomatous colon polyp   ? Asthma   ? Basal cell carcinoma 03/13/2017  ? left forehead - CX3 + cautery + 5FU  ? CAD (coronary artery disease)   ? Chest CTA 10/07: 40% or less pLAD;  ETT 8/09: negative  ? Carotid bruit   ? Chronic back pain   ? Depression   ? Diverticulosis   ? DJD (degenerative joint disease)   ? Dyslipidemia   ? Dysrhythmia   ? pvcs  ? Fatty liver   ? GERD (gastroesophageal reflux disease)   ? HTN (hypertension)   ? Hypothyroidism   ? IBS (irritable bowel syndrome)   ? Internal hemorrhoid   ? Melanoma ([place])   ? metastatic; left leg; s/p multiple excisions  ? Migraine   ? Osteoporosis   ? Spinal stenosis   ? Squamous cell carcinoma of skin 09/02/2015  ? mid chest - CX3 + 5FU  ? Squamous cell carcinoma of skin 11/07/2017  ? left wrist - tx p bx  ? Squamous cell carcinoma of skin 12/11/2017  ? left hand - tx p bx  ? ? ?Past Surgical History:  ?Procedure Laterality Date  ? BACK SURGERY    ? BLEPHAROPLASTY    ? BUNIONECTOMY WITH HAMMERTOE RECONSTRUCTION Right 01/16/2013  ? Procedure: RIGHT Barbie Banner OSTEOTOMY WITH SIMPLE BUNIONECTOMY AND SECOND HAMMER TOE CORRECTION;  Surgeon: Wylene Simmer, MD;  Location: [facility];  Service: Orthopedics;  Laterality: Right;  ? CARPAL TUNNEL RELEASE    ? KNEE ARTHROSCOPY Right   ? LUMBAR LAMINECTOMY/DECOMPRESSION MICRODISCECTOMY N/A 11/13/2013  ? Procedure: LUMBAR TWO-THREE,LUMBAR THREE-FOUR,LUMBAR FOUR-FIVE LAMINECTOMY AND FORAMINOTOMY;  Surgeon: Ophelia Charter, MD;  Location: [facility];  Service: Neurosurgery;  Laterality: N/A;  ? MELANOMA EXCISION Left 09/19/2012  ? Procedure: WIDE EXCISION METASTATIC  MELANOMA LEFT MEDIAL THIGH;  Surgeon: Zenovia Jarred, MD;  Location: [facility];  Service: General;  Laterality: Left;  ? ROTATOR CUFF REPAIR    ? SHOULDER SURGERY    ? TOE SURGERY    ? TOTAL HIP ARTHROPLASTY  09/04/2011  ? Procedure: TOTAL HIP ARTHROPLASTY;  Surgeon: Gearlean Alf, MD;  Location: [facility];  Service: Orthopedics;  Laterality: Left;  ? TOTAL HIP REVISION Left 11/27/2018  ? Procedure: Left hip femoral revision;  Surgeon: Gaynelle Arabian, MD;  Location: [facility];  Service: Orthopedics;  Laterality: Left;  163mn  ? TOTAL KNEE ARTHROPLASTY Right 10/06/2019  ? Procedure: TOTAL KNEE ARTHROPLASTY;  Surgeon: AGaynelle Arabian MD;  Location: [facility];  Service: Orthopedics;  Laterality: Right;  541m  ? ? ?Family History  ?Problem Relation Age of Onset  ? CAD Mother   ? CAD Father   ? Kidney disease Child   ? ? ?Social History:  reports that she quit smoking about 72 years ago. Her smoking use included cigarettes. She has never used smokeless tobacco. She reports current alcohol use of about 2.0 standard drinks per week. She reports that she does not use drugs. ? ?Allergies:  ?Allergies  ?Allergen Reactions  ? Buprenorphine Hcl Itching and Other (See Comments)  ?  Happened a while  back. Patient noted that any narcotic based medication makes her have a bad reaction.  ? Codeine Nausea And Vomiting and Other (See Comments)  ?  Patient gets sick on stomach  ? Morphine And Related Itching and Other (See Comments)  ?  Happened a while back. Patient noted that any narcotic based medication makes her have a bad reaction.  ? Tramadol Nausea And Vomiting and Other (See Comments)  ?  Confusion ?Can tolerate one tablet at night  ? Adhesive [Tape] Other (See Comments)  ?  Tears skin ?  ? Hydrocodone-Acetaminophen Nausea And Vomiting  ? Lactose Intolerance (Gi) Other (See Comments)  ?  Gi upset ?  ? Oxycodone Nausea And Vomiting  ? ? ?Medications: I have reviewed pt's medications ? ?Results for orders placed or  performed during the hospital encounter of 08/09/21 (from the past 48 hour(s))  ?CBC with Differential     Status: Abnormal  ? Collection Time: 08/09/21  7:06 PM  ?Result Value Ref Range  ? WBC 15.7 (H) 4.0 - 10.5 K/uL  ? RBC 4.46 3.87 - 5.11 MIL/uL  ? Hemoglobin 12.6 12.0 - 15.0 g/dL  ? HCT 38.5 36.0 - 46.0 %  ? MCV 86.3 80.0 - 100.0 fL  ? MCH 28.3 26.0 - 34.0 pg  ? MCHC 32.7 30.0 - 36.0 g/dL  ? RDW 14.0 11.5 - 15.5 %  ? Platelets 307 150 - 400 K/uL  ? nRBC 0.0 0.0 - 0.2 %  ? Neutrophils Relative % 79 %  ? Neutro Abs 12.4 (H) 1.7 - 7.7 K/uL  ? Lymphocytes Relative 9 %  ? Lymphs Abs 1.4 0.7 - 4.0 K/uL  ? Monocytes Relative 11 %  ? Monocytes Absolute 1.7 (H) 0.1 - 1.0 K/uL  ? Eosinophils Relative 0 %  ? Eosinophils Absolute 0.0 0.0 - 0.5 K/uL  ? Basophils Relative 0 %  ? Basophils Absolute 0.0 0.0 - 0.1 K/uL  ? Immature Granulocytes 1 %  ? Abs Immature Granulocytes 0.14 (H) 0.00 - 0.07 K/uL  ?  Comment: Performed at [facility], [place] [address] [place], [place] [address]  ?Comprehensive metabolic panel     Status: Abnormal  ? Collection Time: 08/09/21  7:06 PM  ?Result Value Ref Range  ? Sodium 142 135 - 145 mmol/L  ? Potassium 3.2 (L) 3.5 - 5.1 mmol/L  ? Chloride 103 98 - 111 mmol/L  ? CO2 24 22 - 32 mmol/L  ? Glucose, Bld 126 (H) 70 - 99 mg/dL  ?  Comment: Glucose reference range applies only to samples taken after fasting for at least 8 hours.  ? BUN 59 (H) 8 - 23 mg/dL  ? Creatinine, Ser 0.81 0.44 - 1.00 mg/dL  ? Calcium 8.7 (L) 8.9 - 10.3 mg/dL  ? Total Protein 7.3 6.5 - 8.1 g/dL  ? Albumin 3.7 3.5 - 5.0 g/dL  ? AST 103 (H) 15 - 41 U/L  ? ALT 69 (H) 0 - 44 U/L  ? Alkaline Phosphatase 83 38 - 126 U/L  ? Total Bilirubin 1.2 0.3 - 1.2 mg/dL  ? GFR, Estimated >60 >60 mL/min  ?  Comment: (NOTE) ?Calculated using the CKD-EPI Creatinine Equation (2021) ?  ? Anion gap 15 5 - 15  ?  Comment: Performed at [facility], [place] [address], [place], [place] [address]  ?CK     Status:  Abnormal  ? Collection Time: 08/09/21  7:06 PM  ?Result Value Ref Range  ? Total  CK 1,231 (H) 38 - 234 U/L  ?  Comment: Performed at [facility], [place] [address] [place], [place] [address]  ? ? ?CT Head Wo Contrast ? ?Result Date: 08/09/2021 ?CLINICAL DATA:  Patient here from home reporting fall a couple of days ago (can't remember) has been on floor since fall. Reports right hip pain with bruising. Reports prior replacements. 64mg Fent given in route. EXAM: CT HEAD WITHOUT CONTRAST CT CERVICAL SPINE WITHOUT CONTRAST TECHNIQUE: Multidetector CT imaging of the head and cervical spine was performed following the standard protocol without intravenous contrast. Multiplanar CT image reconstructions of the cervical spine were also generated. RADIATION DOSE REDUCTION: This exam was performed according to the departmental dose-optimization program which includes automated exposure control, adjustment of the mA and/or kV according to patient size and/or use of iterative reconstruction technique. COMPARISON:  None. FINDINGS: CT HEAD FINDINGS BRAIN: BRAIN Cerebral ventricle sizes are concordant with the degree of cerebral volume loss. Patchy and confluent areas of decreased attenuation are noted throughout the deep and periventricular white matter of the cerebral hemispheres bilaterally, compatible with chronic microvascular ischemic disease. No evidence of large-territorial acute infarction. No parenchymal hemorrhage. No mass lesion. No extra-axial collection. No mass effect or midline shift. No hydrocephalus. Basilar cisterns are patent. Vascular: No hyperdense vessel. Atherosclerotic calcifications are present within the cavernous internal carotid arteries. Skull: No acute fracture or focal lesion. Sinuses/Orbits: Paranasal sinuses and mastoid air cells are clear. Bilateral lens replacement. Otherwise the orbits are unremarkable. Other: Bilateral maxillary soft tissue calcifications of unclear etiology  (1:3). No large hematoma formation. CT CERVICAL SPINE FINDINGS Alignment: Grade 1 anterolisthesis of C4 on C5. Skull base and vertebrae: Multilevel degenerative changes of the spine most prominent at the C5-C6 level.

## 2021-08-09 NOTE — Assessment & Plan Note (Signed)
Obtain Dopplers to further evaluate CT scan was worrisome for calcifications bilaterally ?

## 2021-08-09 NOTE — ED Notes (Signed)
Dr. Doutova at bedside.  

## 2021-08-09 NOTE — ED Triage Notes (Signed)
Patient here from home reporting fall a couple of days ago (can't remember) has been on floor since fall. Reports right hip pain with bruising. Reports prior replacements. 62mg Fent given in route.  ?

## 2021-08-09 NOTE — Subjective & Objective (Signed)
She had a fall 5 days  ago and could not get up  ? ? ?

## 2021-08-09 NOTE — Assessment & Plan Note (Signed)
Likely secondary to laying down on the floor for prolonged period time rehydrate and follow ?

## 2021-08-09 NOTE — Assessment & Plan Note (Signed)
Rehydrate follow-up in the morning likely secondary to severe dehydration laying down on the floor for prolonged.  Time ?

## 2021-08-10 ENCOUNTER — Inpatient Hospital Stay (HOSPITAL_COMMUNITY): Payer: Medicare Other | Admitting: Anesthesiology

## 2021-08-10 ENCOUNTER — Other Ambulatory Visit: Payer: Self-pay

## 2021-08-10 ENCOUNTER — Inpatient Hospital Stay (HOSPITAL_COMMUNITY): Payer: Medicare Other

## 2021-08-10 ENCOUNTER — Encounter (HOSPITAL_COMMUNITY): Payer: Self-pay | Admitting: Internal Medicine

## 2021-08-10 ENCOUNTER — Encounter (HOSPITAL_COMMUNITY)
Admission: EM | Disposition: A | Discharge status: Skilled Nursing Facility | Payer: Self-pay | Source: Home / Self Care | Attending: Internal Medicine

## 2021-08-10 DIAGNOSIS — I350 Nonrheumatic aortic (valve) stenosis: Secondary | ICD-10-CM

## 2021-08-10 DIAGNOSIS — S72141A Displaced intertrochanteric fracture of right femur, initial encounter for closed fracture: Secondary | ICD-10-CM | POA: Diagnosis not present

## 2021-08-10 DIAGNOSIS — Z01810 Encounter for preprocedural cardiovascular examination: Secondary | ICD-10-CM

## 2021-08-10 DIAGNOSIS — I4729 Other ventricular tachycardia: Secondary | ICD-10-CM | POA: Diagnosis present

## 2021-08-10 DIAGNOSIS — R7989 Other specified abnormal findings of blood chemistry: Secondary | ICD-10-CM | POA: Diagnosis present

## 2021-08-10 DIAGNOSIS — S72001A Fracture of unspecified part of neck of right femur, initial encounter for closed fracture: Secondary | ICD-10-CM | POA: Diagnosis not present

## 2021-08-10 DIAGNOSIS — I2510 Atherosclerotic heart disease of native coronary artery without angina pectoris: Secondary | ICD-10-CM

## 2021-08-10 DIAGNOSIS — E039 Hypothyroidism, unspecified: Secondary | ICD-10-CM

## 2021-08-10 DIAGNOSIS — R778 Other specified abnormalities of plasma proteins: Secondary | ICD-10-CM | POA: Diagnosis present

## 2021-08-10 DIAGNOSIS — T796XXA Traumatic ischemia of muscle, initial encounter: Secondary | ICD-10-CM | POA: Diagnosis not present

## 2021-08-10 DIAGNOSIS — I10 Essential (primary) hypertension: Secondary | ICD-10-CM

## 2021-08-10 DIAGNOSIS — N390 Urinary tract infection, site not specified: Secondary | ICD-10-CM | POA: Diagnosis present

## 2021-08-10 HISTORY — PX: FEMUR IM NAIL: SHX1597

## 2021-08-10 LAB — HEPATIC FUNCTION PANEL
ALT: 62 U/L — ABNORMAL HIGH (ref 0–44)
AST: 88 U/L — ABNORMAL HIGH (ref 15–41)
Albumin: 3.4 g/dL — ABNORMAL LOW (ref 3.5–5.0)
Alkaline Phosphatase: 77 U/L (ref 38–126)
Bilirubin, Direct: 0.2 mg/dL (ref 0.0–0.2)
Indirect Bilirubin: 0.6 mg/dL (ref 0.3–0.9)
Total Bilirubin: 0.8 mg/dL (ref 0.3–1.2)
Total Protein: 6.9 g/dL (ref 6.5–8.1)

## 2021-08-10 LAB — DIFFERENTIAL
Abs Immature Granulocytes: 0.08 10*3/uL — ABNORMAL HIGH (ref 0.00–0.07)
Basophils Absolute: 0 10*3/uL (ref 0.0–0.1)
Basophils Relative: 0 %
Eosinophils Absolute: 0 10*3/uL (ref 0.0–0.5)
Eosinophils Relative: 0 %
Immature Granulocytes: 1 %
Lymphocytes Relative: 11 %
Lymphs Abs: 1.5 10*3/uL (ref 0.7–4.0)
Monocytes Absolute: 1.5 10*3/uL — ABNORMAL HIGH (ref 0.1–1.0)
Monocytes Relative: 11 %
Neutro Abs: 10.2 10*3/uL — ABNORMAL HIGH (ref 1.7–7.7)
Neutrophils Relative %: 77 %

## 2021-08-10 LAB — URINALYSIS, ROUTINE W REFLEX MICROSCOPIC
Bilirubin Urine: NEGATIVE
Glucose, UA: NEGATIVE mg/dL
Ketones, ur: 40 mg/dL — AB
Nitrite: POSITIVE — AB
Protein, ur: NEGATIVE mg/dL
Specific Gravity, Urine: 1.01 (ref 1.005–1.030)
pH: 6 (ref 5.0–8.0)

## 2021-08-10 LAB — CBC
HCT: 34.4 % — ABNORMAL LOW (ref 36.0–46.0)
Hemoglobin: 10.9 g/dL — ABNORMAL LOW (ref 12.0–15.0)
MCH: 27.8 pg (ref 26.0–34.0)
MCHC: 31.7 g/dL (ref 30.0–36.0)
MCV: 87.8 fL (ref 80.0–100.0)
Platelets: 271 10*3/uL (ref 150–400)
RBC: 3.92 MIL/uL (ref 3.87–5.11)
RDW: 14.2 % (ref 11.5–15.5)
WBC: 12.9 10*3/uL — ABNORMAL HIGH (ref 4.0–10.5)
nRBC: 0 % (ref 0.0–0.2)

## 2021-08-10 LAB — MAGNESIUM: Magnesium: 2.5 mg/dL — ABNORMAL HIGH (ref 1.7–2.4)

## 2021-08-10 LAB — TYPE AND SCREEN
ABO/RH(D): A POS
Antibody Screen: NEGATIVE

## 2021-08-10 LAB — BASIC METABOLIC PANEL
Anion gap: 13 (ref 5–15)
BUN: 50 mg/dL — ABNORMAL HIGH (ref 8–23)
CO2: 24 mmol/L (ref 22–32)
Calcium: 8.3 mg/dL — ABNORMAL LOW (ref 8.9–10.3)
Chloride: 105 mmol/L (ref 98–111)
Creatinine, Ser: 0.72 mg/dL (ref 0.44–1.00)
GFR, Estimated: >60 mL/min (ref >60)
Glucose, Bld: 115 mg/dL — ABNORMAL HIGH (ref 70–99)
Potassium: 3.3 mmol/L — ABNORMAL LOW (ref 3.5–5.1)
Sodium: 142 mmol/L (ref 135–145)

## 2021-08-10 LAB — TROPONIN I (HIGH SENSITIVITY)
Troponin I (High Sensitivity): 152 ng/L (ref <18)
Troponin I (High Sensitivity): 215 ng/L (ref <18)
Troponin I (High Sensitivity): 156 ng/L (ref <18)
Troponin I (High Sensitivity): 141 ng/L (ref <18)

## 2021-08-10 LAB — CREATININE, URINE, RANDOM: Creatinine, Urine: 94.24 mg/dL

## 2021-08-10 LAB — ECHOCARDIOGRAM COMPLETE
AR max vel: 1.29 cm2
AV Area VTI: 1.28 cm2
AV Area mean vel: 1.26 cm2
AV Mean grad: 15 mmHg
AV Peak grad: 27.9 mmHg
Ao pk vel: 2.64 m/s
Area-P 1/2: 4.89 cm2
MV VTI: 1.09 cm2
S' Lateral: 2 cm

## 2021-08-10 LAB — SODIUM, URINE, RANDOM: Sodium, Ur: <10 mmol/L

## 2021-08-10 LAB — PHOSPHORUS: Phosphorus: 4.1 mg/dL (ref 2.5–4.6)

## 2021-08-10 LAB — RESP PANEL BY RT-PCR (FLU A&B, COVID) ARPGX2
Influenza A by PCR: NEGATIVE
Influenza B by PCR: NEGATIVE
SARS Coronavirus 2 by RT PCR: NEGATIVE

## 2021-08-10 LAB — URINALYSIS, MICROSCOPIC (REFLEX)

## 2021-08-10 LAB — CK: Total CK: 1050 U/L — ABNORMAL HIGH (ref 38–234)

## 2021-08-10 LAB — LACTIC ACID, PLASMA
Lactic Acid, Venous: 1.1 mmol/L (ref 0.5–1.9)
Lactic Acid, Venous: 1.4 mmol/L (ref 0.5–1.9)

## 2021-08-10 LAB — OSMOLALITY, URINE: Osmolality, Ur: 817 mOsm/kg (ref 300–900)

## 2021-08-10 LAB — OSMOLALITY: Osmolality: 316 mOsm/kg — ABNORMAL HIGH (ref 275–295)

## 2021-08-10 LAB — ALBUMIN: Albumin: 3.7 g/dL (ref 3.5–5.0)

## 2021-08-10 LAB — VITAMIN D 25 HYDROXY (VIT D DEFICIENCY, FRACTURES): Vit D, 25-Hydroxy: 41.92 ng/mL (ref 30–100)

## 2021-08-10 LAB — TSH: TSH: 1.473 u[IU]/mL (ref 0.350–4.500)

## 2021-08-10 SURGERY — INSERTION, INTRAMEDULLARY ROD, FEMUR
Anesthesia: General | Site: Hip | Laterality: Right

## 2021-08-10 MED ORDER — CEFAZOLIN SODIUM-DEXTROSE 2-4 GM/100ML-% IV SOLN
2.0000 g | INTRAVENOUS | Status: AC
  Administered 2021-08-10: 2 g via INTRAVENOUS

## 2021-08-10 MED ORDER — PHENYLEPHRINE HCL-NACL 20-0.9 MG/250ML-% IV SOLN
INTRAVENOUS | Status: AC
  Filled 2021-08-10: qty 250

## 2021-08-10 MED ORDER — PERFLUTREN LIPID MICROSPHERE
1.0000 mL | INTRAVENOUS | Status: AC | PRN
  Administered 2021-08-10: 1 mL via INTRAVENOUS
  Filled 2021-08-10: qty 10

## 2021-08-10 MED ORDER — ROPIVACAINE HCL 5 MG/ML IJ SOLN
INTRAMUSCULAR | Status: DC | PRN
Start: 2021-08-10 — End: 2021-08-10
  Administered 2021-08-10: 30 mL via PERINEURAL

## 2021-08-10 MED ORDER — FENTANYL CITRATE (PF) 100 MCG/2ML IJ SOLN
INTRAMUSCULAR | Status: AC
Start: 2021-08-10 — End: ?
  Filled 2021-08-10: qty 2

## 2021-08-10 MED ORDER — SODIUM CHLORIDE 0.9 % IR SOLN
Status: DC | PRN
  Administered 2021-08-10: 1000 mL

## 2021-08-10 MED ORDER — CEFAZOLIN SODIUM-DEXTROSE 2-4 GM/100ML-% IV SOLN
INTRAVENOUS | Status: AC
  Filled 2021-08-10: qty 100

## 2021-08-10 MED ORDER — DEXAMETHASONE SODIUM PHOSPHATE 10 MG/ML IJ SOLN
INTRAMUSCULAR | Status: DC | PRN
  Administered 2021-08-10: 4 mg via INTRAVENOUS

## 2021-08-10 MED ORDER — ONDANSETRON HCL 4 MG/2ML IJ SOLN
INTRAMUSCULAR | Status: AC
  Filled 2021-08-10: qty 2

## 2021-08-10 MED ORDER — DIPHENHYDRAMINE HCL 25 MG PO CAPS
25.0000 mg | ORAL_CAPSULE | Freq: Once | ORAL | Status: AC
Start: 2021-08-10 — End: 2021-08-10
  Administered 2021-08-10: 25 mg via ORAL
  Filled 2021-08-10 (×2): qty 1

## 2021-08-10 MED ORDER — FENTANYL CITRATE (PF) 100 MCG/2ML IJ SOLN
INTRAMUSCULAR | Status: DC | PRN
  Administered 2021-08-10: 50 ug via INTRAVENOUS

## 2021-08-10 MED ORDER — LACTATED RINGERS IV SOLN: INTRAVENOUS | Status: DC | PRN

## 2021-08-10 MED ORDER — PHENYLEPHRINE 40 MCG/ML (10ML) SYRINGE FOR IV PUSH (FOR BLOOD PRESSURE SUPPORT)
PREFILLED_SYRINGE | INTRAVENOUS | Status: DC | PRN
  Administered 2021-08-10 (×3): 160 ug via INTRAVENOUS

## 2021-08-10 MED ORDER — POVIDONE-IODINE 10 % EX SWAB
2.0000 "application " | Freq: Once | CUTANEOUS | Status: AC
  Administered 2021-08-10: 2 via TOPICAL

## 2021-08-10 MED ORDER — PROPOFOL 10 MG/ML IV BOLUS
INTRAVENOUS | Status: AC
  Filled 2021-08-10: qty 20

## 2021-08-10 MED ORDER — LIDOCAINE HCL (PF) 2 % IJ SOLN
INTRAMUSCULAR | Status: AC
  Filled 2021-08-10: qty 5

## 2021-08-10 MED ORDER — POVIDONE-IODINE 10 % EX SWAB: 2.0000 "application " | Freq: Once | CUTANEOUS | Status: DC

## 2021-08-10 MED ORDER — ISOPROPYL ALCOHOL 70 % SOLN
Status: DC | PRN
  Administered 2021-08-10: 1 via TOPICAL

## 2021-08-10 MED ORDER — SODIUM CHLORIDE 0.9 % IV SOLN
1.0000 g | INTRAVENOUS | Status: DC
  Administered 2021-08-10 – 2021-08-13 (×4): 1 g via INTRAVENOUS
  Filled 2021-08-10 (×4): qty 10

## 2021-08-10 MED ORDER — POTASSIUM CHLORIDE 10 MEQ/100ML IV SOLN
10.0000 meq | INTRAVENOUS | Status: AC
  Administered 2021-08-10 (×2): 10 meq via INTRAVENOUS
  Filled 2021-08-10 (×2): qty 100

## 2021-08-10 MED ORDER — TRANEXAMIC ACID-NACL 1000-0.7 MG/100ML-% IV SOLN
1000.0000 mg | INTRAVENOUS | Status: AC
  Administered 2021-08-10: 1000 mg via INTRAVENOUS

## 2021-08-10 MED ORDER — ACETAMINOPHEN 500 MG PO TABS: 1000.0000 mg | ORAL_TABLET | Freq: Once | ORAL | Status: DC

## 2021-08-10 MED ORDER — ONDANSETRON HCL 4 MG/2ML IJ SOLN
INTRAMUSCULAR | Status: DC | PRN
  Administered 2021-08-10: 4 mg via INTRAVENOUS

## 2021-08-10 MED ORDER — ACETAMINOPHEN 10 MG/ML IV SOLN
1000.0000 mg | Freq: Once | INTRAVENOUS | Status: DC | PRN
  Administered 2021-08-10: 1000 mg via INTRAVENOUS

## 2021-08-10 MED ORDER — ACETAMINOPHEN 10 MG/ML IV SOLN
INTRAVENOUS | Status: AC
  Filled 2021-08-10: qty 100

## 2021-08-10 MED ORDER — DEXAMETHASONE SODIUM PHOSPHATE 10 MG/ML IJ SOLN
INTRAMUSCULAR | Status: AC
Start: 2021-08-10 — End: ?
  Filled 2021-08-10: qty 1

## 2021-08-10 MED ORDER — METHOCARBAMOL 500 MG PO TABS
500.0000 mg | ORAL_TABLET | Freq: Four times a day (QID) | ORAL | Status: DC | PRN
  Administered 2021-08-11 – 2021-08-21 (×15): 500 mg via ORAL
  Filled 2021-08-10 (×15): qty 1

## 2021-08-10 MED ORDER — CHLORHEXIDINE GLUCONATE 4 % EX LIQD: 60.0000 mL | Freq: Once | CUTANEOUS | Status: DC

## 2021-08-10 MED ORDER — PHENYLEPHRINE 40 MCG/ML (10ML) SYRINGE FOR IV PUSH (FOR BLOOD PRESSURE SUPPORT)
PREFILLED_SYRINGE | INTRAVENOUS | Status: AC
  Filled 2021-08-10: qty 30

## 2021-08-10 MED ORDER — METHOCARBAMOL 500 MG IVPB - SIMPLE MED
500.0000 mg | Freq: Four times a day (QID) | INTRAVENOUS | Status: DC | PRN
  Administered 2021-08-13: 500 mg via INTRAVENOUS
  Filled 2021-08-10: qty 50
  Filled 2021-08-10: qty 500

## 2021-08-10 MED ORDER — FENTANYL CITRATE PF 50 MCG/ML IJ SOSY
25.0000 ug | PREFILLED_SYRINGE | INTRAMUSCULAR | Status: DC | PRN
  Administered 2021-08-10 – 2021-08-11 (×2): 25 ug via INTRAVENOUS

## 2021-08-10 MED ORDER — CHLORHEXIDINE GLUCONATE 4 % EX LIQD
60.0000 mL | Freq: Once | CUTANEOUS | Status: DC
  Filled 2021-08-10: qty 60

## 2021-08-10 MED ORDER — PROPOFOL 10 MG/ML IV BOLUS
INTRAVENOUS | Status: DC | PRN
  Administered 2021-08-10: 60 mg via INTRAVENOUS

## 2021-08-10 MED ORDER — ISOPROPYL ALCOHOL 70 % SOLN
Status: AC
  Filled 2021-08-10: qty 480

## 2021-08-10 MED ORDER — ARTIFICIAL TEARS OPHTHALMIC OINT
TOPICAL_OINTMENT | OPHTHALMIC | Status: AC
  Filled 2021-08-10: qty 3.5

## 2021-08-10 MED ORDER — TRANEXAMIC ACID-NACL 1000-0.7 MG/100ML-% IV SOLN
INTRAVENOUS | Status: AC
  Filled 2021-08-10: qty 100

## 2021-08-10 MED ORDER — AMISULPRIDE (ANTIEMETIC) 5 MG/2ML IV SOLN: 10.0000 mg | Freq: Once | INTRAVENOUS | Status: DC | PRN

## 2021-08-10 MED ORDER — ONDANSETRON HCL 4 MG/2ML IJ SOLN: 4.0000 mg | Freq: Once | INTRAMUSCULAR | Status: DC | PRN

## 2021-08-10 MED ORDER — LIDOCAINE 2% (20 MG/ML) 5 ML SYRINGE
INTRAMUSCULAR | Status: DC | PRN
  Administered 2021-08-10: 60 mg via INTRAVENOUS

## 2021-08-10 MED ORDER — EPHEDRINE 5 MG/ML INJ
INTRAVENOUS | Status: AC
  Filled 2021-08-10: qty 5

## 2021-08-10 MED ORDER — PHENYLEPHRINE 40 MCG/ML (10ML) SYRINGE FOR IV PUSH (FOR BLOOD PRESSURE SUPPORT)
PREFILLED_SYRINGE | INTRAVENOUS | Status: AC
Start: 2021-08-10 — End: ?
  Filled 2021-08-10: qty 10

## 2021-08-10 SURGICAL SUPPLY — 49 items
ADH SKN CLS APL DERMABOND .7 (GAUZE/BANDAGES/DRESSINGS) ×1
APL PRP STRL LF DISP 70% ISPRP (MISCELLANEOUS) ×1
BAG COUNTER SPONGE SURGICOUNT (BAG) IMPLANT
BAG SPEC THK2 15X12 ZIP CLS (MISCELLANEOUS)
BAG SPNG CNTER NS LX DISP (BAG)
BAG ZIPLOCK 12X15 (MISCELLANEOUS) IMPLANT
BIT DRILL CANN LG 4.3MM (BIT) IMPLANT
CHLORAPREP W/TINT 26 (MISCELLANEOUS) ×2 IMPLANT
COVER PERINEAL POST (MISCELLANEOUS) ×2 IMPLANT
COVER SURGICAL LIGHT HANDLE (MISCELLANEOUS) ×2 IMPLANT
DERMABOND ADVANCED (GAUZE/BANDAGES/DRESSINGS) ×1
DERMABOND ADVANCED .7 DNX12 (GAUZE/BANDAGES/DRESSINGS) ×1 IMPLANT
DRAPE C-ARM 42X120 X-RAY (DRAPES) ×2 IMPLANT
DRAPE C-ARMOR (DRAPES) ×2 IMPLANT
DRAPE SHEET LG 3/4 BI-LAMINATE (DRAPES) ×2 IMPLANT
DRAPE STERI IOBAN 125X83 (DRAPES) ×2 IMPLANT
DRAPE U-SHAPE 47X51 STRL (DRAPES) ×4 IMPLANT
DRILL BIT CANN LG 4.3MM (BIT) ×2
DRSG AQUACEL AG ADV 3.5X 4 (GAUZE/BANDAGES/DRESSINGS) ×1 IMPLANT
DRSG AQUACEL AG ADV 3.5X 6 (GAUZE/BANDAGES/DRESSINGS) ×2 IMPLANT
DRSG AQUACEL AG ADV 3.5X10 (GAUZE/BANDAGES/DRESSINGS) ×2 IMPLANT
FACESHIELD WRAPAROUND (MASK) ×6 IMPLANT
FACESHIELD WRAPAROUND OR TEAM (MASK) ×3 IMPLANT
GAUZE SPONGE 4X4 12PLY STRL (GAUZE/BANDAGES/DRESSINGS) ×2 IMPLANT
GLOVE SURG ENC MOIS LTX SZ8.5 (GLOVE) ×4 IMPLANT
GLOVE SURG ENC TEXT LTX SZ8 (GLOVE) ×6 IMPLANT
GLOVE SURG UNDER LTX SZ7.5 (GLOVE) ×2 IMPLANT
GLOVE SURG UNDER POLY LF SZ8.5 (GLOVE) ×2 IMPLANT
GOWN SPEC L3 XXLG W/TWL (GOWN DISPOSABLE) ×4 IMPLANT
GUIDEPIN VERSANAIL DSP 3.2X444 (ORTHOPEDIC DISPOSABLE SUPPLIES) ×1 IMPLANT
HFN 125 DEG 9MM X 180MM (Nail) ×1 IMPLANT
JET LAVAGE IRRISEPT WOUND (IRRIGATION / IRRIGATOR)
KIT BASIN OR (CUSTOM PROCEDURE TRAY) ×2 IMPLANT
KIT TURNOVER KIT A (KITS) IMPLANT
LAVAGE JET IRRISEPT WOUND (IRRIGATION / IRRIGATOR) IMPLANT
MANIFOLD NEPTUNE II (INSTRUMENTS) ×2 IMPLANT
MARKER SKIN DUAL TIP RULER LAB (MISCELLANEOUS) ×2 IMPLANT
PACK TOTAL JOINT (CUSTOM PROCEDURE TRAY) ×2 IMPLANT
SCREW BONE CORTICAL 5.0X3 (Screw) ×1 IMPLANT
SCREW LAG HIP NAIL 10.5X95 (Screw) ×1 IMPLANT
STAPLER VISISTAT 35W (STAPLE) ×1 IMPLANT
SUT MNCRL AB 3-0 PS2 18 (SUTURE) IMPLANT
SUT MON AB 2-0 CT1 36 (SUTURE) ×2 IMPLANT
SUT VIC AB 1 CT1 27 (SUTURE) ×2
SUT VIC AB 1 CT1 27XBRD ANTBC (SUTURE) ×1 IMPLANT
TOWEL OR 17X26 10 PK STRL BLUE (TOWEL DISPOSABLE) ×2 IMPLANT
TOWEL OR NON WOVEN STRL DISP B (DISPOSABLE) ×2 IMPLANT
TRAY FOLEY MTR SLVR 14FR STAT (SET/KITS/TRAYS/PACK) ×2 IMPLANT
YANKAUER SUCT BULB TIP NO VENT (SUCTIONS) ×2 IMPLANT

## 2021-08-10 NOTE — Consult Note (Addendum)
?Cardiology Consultation:  ? ?Patient ID: Kerri [name] ?MRN: [id number]; DOB: 23-Feb-1930 ? ?Admit date: 08/09/2021 ?Date of Consult: 08/10/2021 ? ?PCP:  Kerri Orn, MD ?  ?[facility] Prov[facility] ?Cardiologist:  Kerri Breeding, MD      ? ? ?Patient Profile:  ? ?Kerri [name] is a 88 y.o. female with a hx of CAD, palpitation, history of cluster headache, hypothyroidism, hypertension and hyperlipidemia who is being seen 08/10/2021 for the evaluation of chest  at the request of Dr. Lupita [name]. ? ?History of Present Illness:  ? ?Kerri [name] is a 88 year old female with past medical history of CAD, palpitation, history of cluster headache, hypothyroidism, hypertension and hyperlipidemia.  Patient had a coronary CT in October 2007 that showed less than 40% proximal LAD plaque.  ETT in August 2009 was negative.  Patient has never had a MI in the past.  Over the years, she is mainly followed by Dr. Percival [name] for blood pressure control.  Last echocardiogram obtained on 02/14/2019 showed EF 55 to 60%, mild MR, trivial TR, mild dilatation of the ascending aorta measuring at 39 mm.  Her last visit with Dr. Percival [name] was in December 2022 at which time she was doing well.  Patient denied any recent exertional type of chest pain or worsening dyspnea.  She lives by herself and was able to do all housework by herself without any issue.  She was in her usual state of health until she tripped over a shoe roughly 4 days ago and fell to the floor.  She says she heard her hip break as she fell down.  She was unable to get up and remained on the floor for the next 3 days before her neighbor heard her scream.  She was subsequently transferred to [facility].  Viral panel was negative.  Urinalysis showed positive nitrite and many bacteria.  Chest x-ray was unremarkable.  Despite her neck pain, CT of the head and neck showed no acute fracture, however did show severe atherosclerotic plaque in the carotid arteries.  CT of chest abdomen and pelvis  revealed comminuted and displaced intertrochanteric right femoral fracture.  Total CK elevated at 1231.  Serial troponin 215-->152-->156-->141.  EKG showed sinus rhythm with diffuse ST depression involving the inferior lateral leads.  Cardiology service consulted for preoperative clearance prior to a possible orthopedic surgery. ? ? ?Past Medical History:  ?Diagnosis Date  ? Adenomatous colon polyp   ? Asthma   ? Basal cell carcinoma 03/13/2017  ? left forehead - CX3 + cautery + 5FU  ? CAD (coronary artery disease)   ? Chest CTA 10/07: 40% or less pLAD;  ETT 8/09: negative  ? Carotid bruit   ? Chronic back pain   ? Depression   ? Diverticulosis   ? DJD (degenerative joint disease)   ? Dyslipidemia   ? Dysrhythmia   ? pvcs  ? Fatty liver   ? GERD (gastroesophageal reflux disease)   ? HTN (hypertension)   ? Hypothyroidism   ? IBS (irritable bowel syndrome)   ? Internal hemorrhoid   ? Melanoma ([place])   ? metastatic; left leg; s/p multiple excisions  ? Migraine   ? Osteoporosis   ? Spinal stenosis   ? Squamous cell carcinoma of skin 09/02/2015  ? mid chest - CX3 + 5FU  ? Squamous cell carcinoma of skin 11/07/2017  ? left wrist - tx p bx  ? Squamous cell carcinoma of skin 12/11/2017  ? left hand - tx p bx  ? ? ?  Past Surgical History:  ?Procedure Laterality Date  ? BACK SURGERY    ? BLEPHAROPLASTY    ? BUNIONECTOMY WITH HAMMERTOE RECONSTRUCTION Right 01/16/2013  ? Procedure: RIGHT Barbie Banner OSTEOTOMY WITH SIMPLE BUNIONECTOMY AND SECOND HAMMER TOE CORRECTION;  Surgeon: Wylene Simmer, MD;  Location: [place];  Service: Orthopedics;  Laterality: Right;  ? CARPAL TUNNEL RELEASE    ? KNEE ARTHROSCOPY Right   ? LUMBAR LAMINECTOMY/DECOMPRESSION MICRODISCECTOMY N/A 11/13/2013  ? Procedure: LUMBAR TWO-THREE,LUMBAR THREE-FOUR,LUMBAR FOUR-FIVE LAMINECTOMY AND FORAMINOTOMY;  Surgeon: Ophelia Charter, MD;  Location: [facility];  Service: Neurosurgery;  Laterality: N/A;  ? MELANOMA EXCISION Left 09/19/2012  ? Procedure: WIDE  EXCISION METASTATIC MELANOMA LEFT MEDIAL THIGH;  Surgeon: Zenovia Jarred, MD;  Location: [place];  Service: General;  Laterality: Left;  ? ROTATOR CUFF REPAIR    ? SHOULDER SURGERY    ? TOE SURGERY    ? TOTAL HIP ARTHROPLASTY  09/04/2011  ? Procedure: TOTAL HIP ARTHROPLASTY;  Surgeon: Gearlean Alf, MD;  Location: [facility];  Service: Orthopedics;  Laterality: Left;  ? TOTAL HIP REVISION Left 11/27/2018  ? Procedure: Left hip femoral revision;  Surgeon: Gaynelle Arabian, MD;  Location: [facility];  Service: Orthopedics;  Laterality: Left;  160mn  ? TOTAL KNEE ARTHROPLASTY Right 10/06/2019  ? Procedure: TOTAL KNEE ARTHROPLASTY;  Surgeon: AGaynelle Arabian MD;  Location: [facility];  Service: Orthopedics;  Laterality: Right;  540m  ?  ? ?Home Medications:  ?Prior to Admission medications   ?Medication Sig Start Date End Date Taking? Authorizing Provider  ?acetaminophen (TYLENOL) 500 MG tablet Take 500 mg by mouth 3 (three) times daily.   Yes [provider]  ?alendronate (FOSAMAX) 70 MG tablet Take 70 mg by mouth every Sunday. 11/15/18  Yes [provider]  ?Biotin 5 MG CAPS Take 5 mg by mouth daily.   Yes [provider]  ?calcium carbonate (OS-CAL) 1250 (500 Ca) MG chewable tablet Chew 2 tablets by mouth daily.    Yes [provider]  ?chlorthalidone (HYGROTON) 25 MG tablet Take 1 tablet (25 mg total) by mouth daily. 03/31/21  Yes HoMinus BreedingMD  ?Cholecalciferol (VITAMIN D3) 50 MCG (2000 UT) TABS Take 2,000 Units by mouth daily.   Yes [provider]  ?erythromycin ophthalmic ointment Place 1 application. into both eyes daily.   Yes [provider]  ?estradiol (ESTRACE) 0.1 MG/GM vaginal cream Place 1 Applicatorful vaginally every other day. 06/06/19  Yes [provider]  ?fluticasone (FLONASE) 50 MCG/ACT nasal spray Place 2 sprays into both nostrils every morning.  03/29/19  Yes [provider]  ?gabapentin (NEURONTIN) 300 MG capsule  Take 300 mg by mouth 3 (three) times daily.   Yes [provider]  ?levothyroxine (SYNTHROID, LEVOTHROID) 75 MCG tablet Take 75 mcg by mouth daily with breakfast.   Yes [provider]  ?loratadine (CLARITIN) 10 MG tablet Take 10 mg by mouth daily.   Yes [provider]  ?polyethylene glycol powder (GLYCOLAX/MIRALAX) 17 GM/SCOOP powder Take 8.5 g by mouth daily.    Yes [provider]  ?potassium chloride SA (KLOR-CON) 10 MEQ tablet Take 1 tablet (10 mEq total) by mouth daily. 08/25/20  Yes HoMinus BreedingMD  ?rosuvastatin (CRESTOR) 10 MG tablet Take 10 mg by mouth at bedtime.   Yes [provider]  ?tapentadol (NUCYNTA) 50 MG tablet Take 50-100 mg by mouth every 8 (eight) hours as needed for severe pain.   Yes [provider]  ?trimethoprim (TRIMPEX) 100 MG tablet Take 100 mg by mouth daily. UTI prevention 08/04/19  Yes [provider]  ?vitamin B-12 (CYANOCOBALAMIN) 500 MCG tablet Take 500 mcg by mouth daily.   Yes [provider]  ?Eyelid Cleansers (AVENOVA) 0.01 % SOLN Apply topically. 09/16/19   [provider]  ?Flurandrenolide (CORDRAN) 4 MCG/SQCM TAPE Apply 1 each topically daily. 06/29/21   Lavonna Monarch, MD  ?Probiotic Product (PROBIOTIC PO) Take 1 capsule by mouth daily.    [provider]  ?tretinoin (RETIN-A) 0.05 % cream Apply 1 application topically every other day.     [provider]  ?tretinoin (RETIN-A) 0.1 % cream Apply topically at bedtime. 06/14/21 06/14/22  Warren Danes, PA-C  ? ? ?Inpatient Medications: ?Scheduled Meds: ? gabapentin  300 mg Oral TID  ? levothyroxine  75 mcg Oral Q0600  ? potassium chloride  20 mEq Oral Once  ? ?Continuous Infusions: ? sodium chloride 100 mL/hr at 08/10/21 0306  ? cefTRIAXone (ROCEPHIN)  IV 1 g (08/10/21 0455)  ? methocarbamol (ROBAXIN) IV    ? ?PRN Meds: ?HYDROmorphone (DILAUDID) injection, methocarbamol **OR** methocarbamol (ROBAXIN) IV, polyethylene  glycol ? ?Allergies:    ?Allergies  ?Allergen Reactions  ? Buprenorphine Hcl Itching and Other (See Comments)  ?  Happened awhile back- patient noted that any narcotic-based medication makes her have a "bad reaction"

## 2021-08-10 NOTE — Consult Note (Signed)
WOC Nurse Consult Note: ?Reason for Consult: pressure injury ?Patient sustained a fall at home and was down between 3-5 days, incontinent during down time. ?Wound type: Deep Tissue Pressure Injury  ?Pressure Injury POA: Yes ?Measurement: 5cm x 4cm x 0 ?Wound bed: dark purple, non blanchable, superficial skin peeling, not open  ?Drainage (amount, consistency, odor) weeping but urine noted on the dressing ?Periwound: intact  ?Dressing procedure/placement/frequency: ?Apply single layer of xeroform over the sacral wound. ?Top with sacral foam. Change every 3 days, however need to lift daily to assess for acute changes.  ?      Notify WOC nurse of acute changes  ?3. RD for evaluation for nutritional   supplementation. ? ?Daughter at bedside, reports she is a Marine scientist. Discussed  ? ? ?Discussed POC with patient and bedside nurse.  ?Re consult if needed, will not follow at this time. ?Thanks ? Cali Hope [facility] MSN, RN,CWOCN, CNS, CWON-AP [phone])  ? ? ?  ?

## 2021-08-10 NOTE — Progress Notes (Signed)
MD Doutova notified of critical lab Troponin 215. ?

## 2021-08-10 NOTE — Progress Notes (Signed)
Carotid artery duplex has been completed. ?Preliminary results can be found in CV Proc through chart review.  ? ?08/10/21 9:52 AM ?Kerri [name] RVT   ?

## 2021-08-10 NOTE — Anesthesia Postprocedure Evaluation (Signed)
Anesthesia Post Note ? ?Patient: Prestyn Mahn ? ?Procedure(s) Performed: AN AD HOC NERVE BLOCK ? ?  ? ?Patient location during evaluation: PACU ?Anesthesia Type: Regional ?Level of consciousness: awake and alert ?Pain management: pain level controlled ?Vital Signs Assessment: post-procedure vital signs reviewed and stable ?Respiratory status: spontaneous breathing, nonlabored ventilation, respiratory function stable and patient connected to nasal cannula oxygen ?Cardiovascular status: blood pressure returned to baseline and stable ?Postop Assessment: no apparent nausea or vomiting ?Anesthetic complications: no ? ? ?No notable events documented. ? ?Last Vitals:  ?Vitals:  ? 08/10/21 1109 08/10/21 1110  ?BP:  139/71  ?Pulse: (!) 101 100  ?Resp: 10 (!) 21  ?Temp:    ?SpO2: 92% 92%  ?  ?Last Pain:  ?Vitals:  ? 08/10/21 0834  ?TempSrc: Oral  ?PainSc: 0-No pain  ? ? ?  ?  ?  ?  ?  ?  ? ?Lynda Rainwater ? ? ? ? ?

## 2021-08-10 NOTE — Assessment & Plan Note (Addendum)
CP free but given frequent PVC and ECG worrisome for demand induced changes will obtain echo and serial ecg ?Cardiac clearance prior to OR ?likely demand in the setting of dehydration tachycardia and elevated CK ? ?

## 2021-08-10 NOTE — Progress Notes (Signed)
Asked by Dr. Tonita Cong to do IM nail R femur. Plan for surgery today as add-on depending on OR availability. NPO. Hold chemical DVT ppx. CK trending down. Creatinine WNL. Traumatic rhabdo and other medical issues per TRH. ?

## 2021-08-10 NOTE — Anesthesia Procedure Notes (Signed)
Procedure Name: LMA Insertion ?Date/Time: 08/10/2021 9:44 PM ?Performed by: Rosaland Lao, CRNA ?Pre-anesthesia Checklist: Patient identified, Emergency Drugs available, Suction available and Patient being monitored ?Patient Re-evaluated:Patient Re-evaluated prior to induction ?Oxygen Delivery Method: Circle system utilized ?Preoxygenation: Pre-oxygenation with 100% oxygen ?Induction Type: IV induction ?LMA: LMA with gastric port inserted ?LMA Size: 4.0 ?Tube type: Oral ?Number of attempts: 1 ?Placement Confirmation: positive ETCO2 and breath sounds checked- equal and bilateral ?Tube secured with: Tape ?Dental Injury: Teeth and Oropharynx as per pre-operative assessment  ? ? ? ? ?

## 2021-08-10 NOTE — Interval H&P Note (Signed)
History and Physical Interval Note: ? ?08/10/2021 ?8:42 PM ? ?Kerri [name]  has presented today for surgery, with the diagnosis of Intertrochanteric Fractures RIGHT.  The various methods of treatment have been discussed with the patient and family. After consideration of risks, benefits and other options for treatment, the patient has consented to  Procedure(s): ?INTRAMEDULLARY (IM) NAIL FEMORAL (Right) as a surgical intervention.  The patient's history has been reviewed, patient examined, no change in status, stable for surgery.  I have reviewed the patient's chart and labs.  Questions were answered to the patient's satisfaction.   ? ?The risks, benefits, and alternatives were discussed with the patient. There are risks associated with the surgery including, but not limited to, problems with anesthesia (death), infection, differences in leg length/angulation/rotation, fracture of bones, loosening or failure of implants, malunion, nonunion, hematoma (blood accumulation) which may require surgical drainage, blood clots, pulmonary embolism, nerve injury (foot drop), and blood vessel injury. The patient understands these risks and elects to proceed. ? ? ?Hilton Cork Gerold Sar ? ? ?

## 2021-08-10 NOTE — Anesthesia Preprocedure Evaluation (Signed)
Anesthesia Evaluation  ?Patient identified by MRN, date of birth, ID band ?Patient awake ? ? ? ?Reviewed: ?Allergy & Precautions, NPO status , Patient's Chart, lab work & pertinent test results ? ?Airway ?Mallampati: II ? ?TM Distance: >3 FB ?Neck ROM: Full ? ? ? Dental ?no notable dental hx. ? ?  ?Pulmonary ?asthma , former smoker,  ?  ?Pulmonary exam normal ?breath sounds clear to auscultation ? ? ? ? ? ? Cardiovascular ?hypertension, Pt. on medications ?+ CAD  ?Normal cardiovascular exam ?Rhythm:Regular Rate:Normal ? ? ?  ?Neuro/Psych ? Headaches, Depression negative psych ROS  ? GI/Hepatic ?Neg liver ROS, GERD  Medicated,  ?Endo/Other  ?Hypothyroidism  ? Renal/GU ?negative Renal ROS  ?negative genitourinary ?  ?Musculoskeletal ? ?(+) Arthritis , Osteoarthritis,   ? Abdominal ?  ?Peds ?negative pediatric ROS ?(+)  Hematology ? ?(+) Blood dyscrasia, anemia ,   ?Anesthesia Other Findings ?Hip Fracture ? Reproductive/Obstetrics ?negative OB ROS ? ?  ? ? ? ? ? ? ? ? ? ? ? ? ? ?  ?  ? ? ? ? ? ? ? ? ?Anesthesia Physical ? ?Anesthesia Plan ? ?ASA: III ? ?Anesthesia Plan:   ? ?Post-op Pain Management: Regional block*  ? ?Induction: Intravenous ? ?PONV Risk Score and Plan: 2 and Treatment may vary due to age or medical condition ? ?Airway Management Planned: Natural Airway ? ?Additional Equipment:  ? ?Intra-op Plan:  ? ?Post-operative Plan:  ? ?Informed Consent: I have reviewed the patients History and Physical, chart, labs and discussed the procedure including the risks, benefits and alternatives for the proposed anesthesia with the patient or authorized representative who has indicated his/her understanding and acceptance.  ? ? ? ? ? ?Plan Discussed with: CRNA and Surgeon ? ?Anesthesia Plan Comments: (See PAT note 09/24/2019, Konrad Felix, PA-C)  ? ? ? ? ? ? ?Anesthesia Quick Evaluation ? ?

## 2021-08-10 NOTE — Hospital Course (Addendum)
88 y.o. female with medical history significant of hypertension history of syncope cluster headaches, asthma, CAD, chronic back pain, depression, HLD, GERD, hypothyroidism, irritable bowel syndrome, history of melanoma presented to the ED after a fall that was 5 days PTA a and could not get up.  Per report while going to the shower tripped on her shoes and fell down and hurt her hip back, did not have a fall to call anybody. She is admitted for  Closed 2-part intertrochanteric fracture of right femur, traumatic rhabdomyolysis. Had abnormal ecg and pt could not provide hx,and had small run of NSVT, patient was admitted for orthopedic evaluation, cardiology was consulted for preoperative evaluation.  In the ED CT head no acute finding, but with severe atherosclerotic plaque of the carotid arteries within the neck, chest x-ray no acute finding CT abdomen pelvis no acute finding, CTA chest no evidence of infiltrate. ?Patient was admitted and seen by cardiology in preop evaluation underwent echocardiogram and carotid duplex.  Underwent IM nailing of the right femur postop course relatively stable but with verbal aggression, psych was consulted.  She also had acute blood loss anemia in the setting of hip fracture, also treated for UTI.  Continued on pain management IV antibiotics. ?Plan is for skilled nursing facility once cleared by orthopedics and urine culture resulted.  Urine culture grew ESBL, patient was managed with meropenem, sensitive to nitrofurantoin. ?3/27-she had episode of fever along with altered mental status/responsiveness seen urgently underwent further work-up neurology consult MRI brain that showed punctate multifocal strokes, AKI and felt to be delirium in the setting of UTI.  Completed stroke work-up after Plavix load placed on aspirin Plavix. ?

## 2021-08-10 NOTE — Anesthesia Procedure Notes (Signed)
Anesthesia Regional Block: Femoral nerve block  ? ?Pre-Anesthetic Checklist: , timeout performed,  Correct Patient, Correct Site, Correct Laterality,  Correct Procedure, Correct Position, site marked,  Risks and benefits discussed,  Surgical consent,  Pre-op evaluation,  At surgeon's request and post-op pain management ? ?Laterality: Right ? ?Prep: chloraprep     ?  ?Needles:  ?Injection technique: Single-shot ? ?Needle Type: Stimiplex   ? ? ?Needle Length: 9cm  ?Needle Gauge: 21  ? ? ? ?Additional Needles: ? ? ?Narrative:  ?Start time: 08/10/2021 11:11 AM ?End time: 08/10/2021 11:16 AM ?Injection made incrementally with aspirations every 5 mL. ? ?Performed by: Personally  ? ? ? ?

## 2021-08-10 NOTE — Assessment & Plan Note (Signed)
-   treat with Rocephin         await results of urine culture and adjust antibiotic coverage as needed  

## 2021-08-10 NOTE — Anesthesia Preprocedure Evaluation (Addendum)
Anesthesia Evaluation  ?Patient identified by MRN, date of birth, ID band ?Patient awake ? ? ? ?Reviewed: ?Allergy & Precautions, NPO status , Patient's Chart, lab work & pertinent test results ? ?Airway ?Mallampati: III ? ?TM Distance: >3 FB ?Neck ROM: Full ? ?Mouth opening: Limited Mouth Opening ? Dental ?no notable dental hx. ? ?  ?Pulmonary ?asthma , former smoker,  ?  ?Pulmonary exam normal ? ? ? ? ? ? ? Cardiovascular ?hypertension, + CAD  ?Normal cardiovascular exam ? ?ECHO: ?Left ventricular ejection fraction, by estimation, is >75%. The left ventricle has hyperdynamic function. The left ventricle has no regional wall motion abnormalities. ?There is mild concentric left ventricular hypertrophy. Left ventricular diastolic parameters are consistent with Grade I diastolic dysfunction (impaired relaxation). ?Right ventricular systolic function is normal. The right ventricular size is normal. ?The mitral valve is grossly normal. No evidence of mitral valve regurgitation. No evidence of mitral stenosis. ?The aortic valve is calcified. There is moderate calcification of the aortic valve. There ?is moderate thickening of the aortic valve. Aortic valve regurgitation is not visualized. ?Mild aortic valve stenosis. Aortic valve area, by VTI measures 1.28 cm?Marland Kitchen Aortic valve ?mean gradient measures 15.0 mmHg. Aortic valve Vmax measures 2.64 m/s. ?  ?Neuro/Psych ? Headaches, PSYCHIATRIC DISORDERS Depression   ? GI/Hepatic ?  ?Endo/Other  ?Hypothyroidism  ? Renal/GU ?  ? ?  ?Musculoskeletal ? ?(+) Arthritis , Spinal stenosis ?Chronic back pain  ? Abdominal ?  ?Peds ? Hematology ? ?(+) Blood dyscrasia, anemia ,   ?Anesthesia Other Findings ?Intertrochanteric fracture right ? Reproductive/Obstetrics ? ?  ? ? ? ? ? ? ? ? ? ? ? ? ? ?  ?  ? ? ? ? ? ? ?Anesthesia Physical ?Anesthesia Plan ? ?ASA: 3 ? ?Anesthesia Plan: General  ? ?Post-op Pain Management:   ? ?Induction: Intravenous ? ?PONV Risk  Score and Plan: 3 and Ondansetron, Dexamethasone and Treatment may vary due to age or medical condition ? ?Airway Management Planned: LMA ? ?Additional Equipment:  ? ?Intra-op Plan:  ? ?Post-operative Plan: Extubation in OR ? ?Informed Consent: I have reviewed the patients History and Physical, chart, labs and discussed the procedure including the risks, benefits and alternatives for the proposed anesthesia with the patient or authorized representative who has indicated his/her understanding and acceptance.  ? ? ? ?Dental advisory given ? ?Plan Discussed with: CRNA ? ?Anesthesia Plan Comments:   ? ? ? ? ? ? ?Anesthesia Quick Evaluation ? ?

## 2021-08-10 NOTE — Op Note (Signed)
OPERATIVE REPORT ? ?SURGEON: Rod Can, MD  ? ?ASSISTANT: Staff. ? ?PREOPERATIVE DIAGNOSIS: Right intertrochanteric femur fracture.  ? ?POSTOPERATIVE DIAGNOSIS: Right intertrochanteric femur fracture.  ? ?PROCEDURE: Intramedullary fixation, Right femur.  ? ?IMPLANTS: Biomet Affixus Hip Fracture Nail, 9 by 180 mm, 125 degrees. ?10.5 x 95 mm Hip Fracture Nail Lag Screw. ?5 x 34 mm distal interlocking screw ?1. ? ?ANESTHESIA:  General and Regional ? ?ESTIMATED BLOOD LOSS:-100 mL   ? ?ANTIBIOTICS: 2 g Ancef. ? ?DRAINS: None. ? ?COMPLICATIONS: None. ?  ?CONDITION: PACU - hemodynamically stable..  ? ?BRIEF CLINICAL NOTE: Kerri [name] is a 88 y.o. female who presented with an intertrochanteric femur fracture. The patient was admitted to the hospitalist service and underwent perioperative risk stratification and medical optimization. The risks, benefits, and alternatives to the procedure were explained, and the patient elected to proceed. ? ?PROCEDURE IN DETAIL: Surgical site was marked by myself. The patient was taken to the operating room and anesthesia was induced on the bed. The patient was then transferred to the [facility] table and the nonoperative lower extremity was scissored underneath the operative side. The fracture was reduced with traction, internal rotation, and adduction. The hip was prepped and draped in the normal sterile surgical fashion. Timeout was called verifying side and site of surgery. Preop antibiotics were given with 60 minutes of beginning the procedure. ? ?Fluoroscopy was used to define the patient's anatomy. A 4 cm incision was made just proximal to the tip of the greater trochanter. The awl was used to obtain the standard starting point for a trochanteric entry nail under fluoroscopic control. The guidepin was placed. The entry reamer was used to open the proximal femur. ? ?On the back table, the nail was assembled onto the jig. The nail was placed into the femur without any difficulty. Through  a separate stab incision, the cannula was placed down to the bone in preparation for the cephalomedullary device. A guidepin was placed into the femoral head using AP and lateral fluoroscopy views. The pin was measured, and then reaming was performed to the appropriate depth. The lag screw was inserted to the appropriate depth. The fracture was compressed through the jig. The setscrew was tightened and then loosened one quarter turn. A separate stab incision was created, and the distal interlocking screw was placed using standard AO technique. The jig was removed. Final AP and lateral fluoroscopy views were obtained to confirm fracture reduction and hardware placement. Tip apex distance was appropriate. There was no chondral penetration. ? ?The wounds were copiously irrigated with saline. The wound was closed in layers with #1 Vicryl for the fascia, 2-0 Monocryl for the deep dermal layer, and and staples + Dermabond skin. Once the glue was fully hardened, sterile dressing was applied. The patient was then awakened from anesthesia and taken to the PACU in stable condition. Sponge needle and instrument counts were correct at the end of the case ?2. There were no known complications. ? ?We will readmit the patient to the hospitalist. Weightbearing status will be weightbearing as tolerated with a walker. We will begin ASA for DVT prophylaxis. The patient will mobilize out of bed with physical therapy and undergo disposition planning. ?

## 2021-08-10 NOTE — Transfer of Care (Signed)
Immediate Anesthesia Transfer of Care Note ? ?Patient: Kerri [name] ? ?Procedure(s) Performed: INTRAMEDULLARY (IM) NAIL FEMORAL (Right: Hip) ? ?Patient Location: PACU ? ?Anesthesia Type:General ? ?Level of Consciousness: drowsy ? ?Airway & Oxygen Therapy: Patient Spontanous Breathing and Patient connected to face mask ? ?Post-op Assessment: Report given to RN and Post -op Vital signs reviewed and stable ? ?Post vital signs: Reviewed and stable ? ?Last Vitals:  ?Vitals Value Taken Time  ?BP 155/79 08/10/21 2245  ?Temp    ?Pulse 82 08/10/21 2249  ?Resp 15 08/10/21 2249  ?SpO2 100 % 08/10/21 2249  ?Vitals shown include unvalidated device data. ? ?Last Pain:  ?Vitals:  ? 08/10/21 1645  ?TempSrc: Oral  ?PainSc:   ?   ? ?Patients Stated Pain Goal: 2 (08/10/21 1511) ? ?Complications: No notable events documented. ?

## 2021-08-10 NOTE — Discharge Instructions (Signed)
 Dr. Sanii Kukla [facility] K[facility] Specialist [facility] [address] , [place] [address] [phone]   POSTOPERATIVE DIRECTIONS    Hip Rehabilitation, Guidelines Following Surgery   WEIGHT BEARING Weight bearing as tolerated with assist device (walker, cane, etc) as directed, use it as long as suggested by your surgeon or therapist, typically at least 4-6 weeks.   HOME CARE INSTRUCTIONS  Remove items at home which could result in a fall. This includes throw rugs or furniture in walking pathways.  Continue medications as instructed at time of discharge.  You may have some home medications which will be placed on hold until you complete the course of blood thinner medication.  4 days after discharge, you may start showering. No tub baths or soaking your incisions. Do not put on socks or shoes without following the instructions of your caregivers.   Sit on chairs with arms. Use the chair arms to help push yourself up when arising.  Arrange for the use of a toilet seat elevator so you are not sitting low.   Walk with walker as instructed.  You may resume a sexual relationship in one month or when given the OK by your caregiver.  Use walker as long as suggested by your caregivers.  Avoid periods of inactivity such as sitting longer than an hour when not asleep. This helps prevent blood clots.  You may return to work once you are cleared by your surgeon.  Do not drive a car for 6 weeks or until released by your surgeon.  Do not drive while taking narcotics.  Wear elastic stockings for two weeks following surgery during the day but you may remove then at night.  Make sure you keep all of your appointments after your operation with all of your doctors and caregivers. You should call the office at the above phone number and make an appointment for approximately two weeks after the date of your surgery. Please pick up a stool softener and laxative  for home use as long as you are requiring pain medications.  ICE to the affected hip every three hours for 30 minutes at a time and then as needed for pain and swelling. Continue to use ice on the hip for pain and swelling from surgery. You may notice swelling that will progress down to the foot and ankle.  This is normal after surgery.  Elevate the leg when you are not up walking on it.   It is important for you to complete the blood thinner medication as prescribed by your doctor.  Continue to use the breathing machine which will help keep your temperature down.  It is common for your temperature to cycle up and down following surgery, especially at night when you are not up moving around and exerting yourself.  The breathing machine keeps your lungs expanded and your temperature down.  RANGE OF MOTION AND STRENGTHENING EXERCISES  These exercises are designed to help you keep full movement of your hip joint. Follow your caregiver's or physical therapist's instructions. Perform all exercises about fifteen times, three times per day or as directed. Exercise both hips, even if you have had only one joint replacement. These exercises can be done on a training (exercise) mat, on the floor, on a table or on a bed. Use whatever works the best and is most comfortable for you. Use music or television while you are exercising so that the exercises are a pleasant break in your day. This   will make your life better with the exercises acting as a break in routine you can look forward to.  Lying on your back, slowly slide your foot toward your buttocks, raising your knee up off the floor. Then slowly slide your foot back down until your leg is straight again.  Lying on your back spread your legs as far apart as you can without causing discomfort.  Lying on your side, raise your upper leg and foot straight up from the floor as far as is comfortable. Slowly lower the leg and repeat.  Lying on your back, tighten up the  muscle in the front of your thigh (quadriceps muscles). You can do this by keeping your leg straight and trying to raise your heel off the floor. This helps strengthen the largest muscle supporting your knee.  Lying on your back, tighten up the muscles of your buttocks both with the legs straight and with the knee bent at a comfortable angle while keeping your heel on the floor.   SKILLED REHAB INSTRUCTIONS: If the patient is transferred to a skilled rehab facility following release from the hospital, a list of the current medications will be sent to the facility for the patient to continue.  When discharged from the skilled rehab facility, please have the facility set up the patient's Home Health Physical Therapy prior to being released. Also, the skilled facility will be responsible for providing the patient with their medications at time of release from the facility to include their pain medication and their blood thinner medication. If the patient is still at the rehab facility at time of the two week follow up appointment, the skilled rehab facility will also need to assist the patient in arranging follow up appointment in our office and any transportation needs.  MAKE SURE YOU:  Understand these instructions.  Will watch your condition.  Will get help right away if you are not doing well or get worse.  Pick up stool softner and laxative for home use following surgery while on pain medications. Daily dry dressing changes as needed. In 4 days, you may remove your dressings and begin taking showers - no tub baths or soaking the incisions. Continue to use ice for pain and swelling after surgery. Do not use any lotions or creams on the incision until instructed by your surgeon.   

## 2021-08-10 NOTE — Assessment & Plan Note (Signed)
Check electrolytes and correct cycle ce, obtain echo ?Can start low dose bb ?

## 2021-08-10 NOTE — Progress Notes (Signed)
?PROGRESS NOTE ?Kerri [name]  XBD:[id number] DOB: 1929/11/30 DOA: 08/09/2021 ?PCP: Lavone Orn, MD  ? ?Brief Narrative/Hospital Course: ?88 y.o. female with medical history significant of hypertension history of syncope cluster headaches, asthma, CAD, chronic back pain, depression, HLD, GERD, hypothyroidism, irritable bowel syndrome, history of melanoma presented to the ED after a fall that was 5 days PTA a and could not get up.  Per report while going to the shower tripped on her shoes and fell down and hurt her hip back, did not have a fall to call anybody. She is admitted for  Closed 2-part intertrochanteric fracture of right femur, traumatic rhabdomyolysis. Had abnormal ecg and pt could not provide hx,and had small run of NSVT, patient was admitted for orthopedic evaluation, cardiology was consulted for preoperative evaluation.  In the ED CT head no acute finding, but with severe atherosclerotic plaque of the carotid arteries within the neck, chest x-ray no acute finding CT abdomen pelvis no acute finding, CTA chest no evidence of infiltrate.  ?  ?Subjective: ?Seen and examined this morning.  Resting comfortably not at the bedside, pain controlled with current IV pain regimen ?Assessment and Plan: ?Principal Problem: ?  Hip fracture ([place]) ?Active Problems: ?  Hypothyroidism ?  Hyperlipidemia ?  Essential hypertension ?  Coronary atherosclerosis ?  Carotid bruit ?    Hypokalemia ?  Elevated CK ?  Dehydration ?  NSVT (nonsustained ventricular tachycardia) ?  Elevated troponin ?  UTI (urinary tract infection) ?  ?Closed 2 part intertrochanteric fracture of the right femur status post mechanical fall: Orthopedic consulted, cardiology consult for preoperative evaluation.Follow-up vitamin D level.  Surgery planning for operative intervention sometime later today pending cardiology clearance. ? ?Traumatic rhabdomyolysis: CK on admission 1231, downtrending 1050 ?Dehydration: ?Hypokalemia: ?K repleted. Cont IV fluids  monitor electrolytes ? ?Abnormal EKG-with ST depression diffuse ?NSVT  ?CAD: ?Mildly elevated troponin 141-1 52: Likely demand ischemia. ?Cardiology consulted.  Denies chest pain.  Echo pending ? ?Hyperlipidemia on Crestor-hold for rhabdomyolysis ?Hypertension on chlorthalidone last echo with normal EF in 2020 ?Atherosclerotic plaque in the carotid arteries in the CT scan incidentally noted, carotid duplex no acute finding. ? ?Hypothyroidism on Synthroid.  Euthyroid with TSH 1.4 ?Leukocytosis/UTI: ?UA with WBC 21-50, send urine for culture.  On ceftriaxone ? ?DVT prophylaxis: SCDs Start: 08/09/21 2345 ?Code Status:   Code Status: Full Code ?Family Communication: plan of care discussed with patient/daughter at bedside. ?Patient status is: Inpatient level of care: Telemetry  ?Remains inpatient because: Ongoing management of hip fracture ?Patient currently not stable ? ?Dispo: The patient is from: Home ?           Anticipated disposition: Skilled nursing facility 2 days ? ?Mobility Assessment (last 72 hours)   ? ? Mobility Assessment   ? ? [place] Name 08/09/21 2343  ?  ?  ?  ?  ? Does patient have an order for bedrest or is patient medically unstable No - Continue assessment      ? What is the highest level of mobility based on the progressive mobility assessment? Level 1 (Bedfast) - Unable to balance while sitting on edge of bed      ? Is the above level different from baseline mobility prior to current illness? Yes - Recommend PT order      ? ?  ?  ? ?  ?  ? ?Objective: ?Vitals last 24 hrs: ?Vitals:  ? 08/10/21 1107 08/10/21 1108 08/10/21 1109 08/10/21 1110  ?BP:    139/71  ?  Pulse: (!) 101 (!) 102 (!) 101 100  ?Resp: '11 10 10 '$ (!) 21  ?Temp:      ?TempSrc:      ?SpO2: 93% 93% 92% 92%  ? ?Weight change:  ? ?Physical Examination: ?General exam: AA0X3,older than stated age, weak appearing. ?HEENT:Oral mucosa moist, Ear/Nose WNL grossly, dentition normal. ?Respiratory system: bilaterally diminished BS, no use of accessory  muscle ?Cardiovascular system: S1 & S2 +, No JVD,. ?Gastrointestinal system: Abdomen soft,NT,ND, BS+ ?Nervous System:Alert, awake, moving extremities and grossly nonfocal ?Extremities: LE edema none,distal peripheral pulses palpable.  ?Skin: No rashes,no icterus. ?MSK: Normal muscle bulk,tone, power ? ?Medications reviewed:  ?Scheduled Meds: ? acetaminophen  1,000 mg Oral Once  ? chlorhexidine  60 mL Topical Once  ? gabapentin  300 mg Oral TID  ? levothyroxine  75 mcg Oral Q0600  ? potassium chloride  20 mEq Oral Once  ? povidone-iodine  2 application. Topical Once  ? povidone-iodine  2 application. Topical Once  ? ?Continuous Infusions: ? [START ON 08/11/2021]  ceFAZolin (ANCEF) IV    ? cefTRIAXone (ROCEPHIN)  IV 1 g (08/10/21 0455)  ? methocarbamol (ROBAXIN) IV    ? tranexamic acid    ? ? ?  ?Diet Order   ? ?       ?  Diet NPO time specified Except for: Sips with Meds  Diet effective now       ?  ? ?  ?  ? ?  ? ?Unresulted Labs (From admission, onward)  ? ?  Start     Ordered  ? 08/10/21 0147  Urine Culture  Once,   R       ?Question:  Indication  Answer:  Altered mental status (if no other cause identified)  ? 08/10/21 0146  ? ?  ?  ? ?  ?ata Reviewed: I have personally reviewed following labs and imaging studies ?CBC: ?Recent Labs  ?Lab 15-Aug-2021 ?1906 08/10/21 ?0450  ?WBC 15.7* 12.9*  ?NEUTROABS 12.4* 10.2*  ?HGB 12.6 10.9*  ?HCT 38.5 34.4*  ?MCV 86.3 87.8  ?PLT 307 271  ? ?Basic Metabolic Panel: ?Recent Labs  ?Lab 08/15/2021 ?1906 15-Aug-2021 ?2300 08/10/21 ?0450  ?NA 142  --  142  ?K 3.2*  --  3.3*  ?CL 103  --  105  ?CO2 24  --  24  ?GLUCOSE 126*  --  115*  ?BUN 59*  --  50*  ?CREATININE 0.81  --  0.72  ?CALCIUM 8.7*  --  8.3*  ?MG  --  2.5*  --   ?PHOS  --  4.1  --   ? ?GFR: ?CrCl cannot be calculated (Unknown ideal weight.). ?Liver Function Tests: ?Recent Labs  ?Lab 2021-08-15 ?1906 2021-08-15 ?2300 08/10/21 ?0024  ?AST 103* 88*  --   ?ALT 69* 62*  --   ?ALKPHOS 83 77  --   ?BILITOT 1.2 0.8  --   ?PROT 7.3 6.9  --    ?ALBUMIN 3.7 3.4* 3.7  ? ?No results for input(s): LIPASE, AMYLASE in the last 168 hours. ?No results for input(s): AMMONIA in the last 168 hours. ?Coagulation Profile: ?No results for input(s): INR, PROTIME in the last 168 hours. ?BNP (last 3 results) ?No results for input(s): PROBNP in the last 8760 hours. ?HbA1C: ?No results for input(s): HGBA1C in the last 72 hours. ?CBG: ?No results for input(s): GLUCAP in the last 168 hours. ?Lipid Profile: ?No results for input(s): CHOL, HDL, LDLCALC, TRIG, CHOLHDL, LDLDIRECT in the last 72 hours. ?  Thyroid Function Tests: ?Recent Labs  ?  08/10/21 ?0024  ?TSH 1.473  ? ?Sepsis Labs: ?Recent Labs  ?Lab 08/09/21 ?2300 08/10/21 ?0024  ?LATICACIDVEN 1.1 1.4  ? ? ?Recent Results (from the past 240 hour(s))  ?Resp Panel by RT-PCR (Flu A&B, Covid) Nasopharyngeal Swab     Status: None  ? Collection Time: 08/09/21 11:00 PM  ? Specimen: Nasopharyngeal Swab; Nasopharyngeal(NP) swabs in vial transport medium  ?Result Value Ref Range Status  ? SARS Coronavirus 2 by RT PCR NEGATIVE NEGATIVE Final  ?  Comment: (NOTE) ?SARS-CoV-2 target nucleic acids are NOT DETECTED. ? ?The SARS-CoV-2 RNA is generally detectable in upper respiratory ?specimens during the acute phase of infection. The lowest ?concentration of SARS-CoV-2 viral copies this assay can detect is ?138 copies/mL. A negative result does not preclude SARS-Cov-2 ?infection and should not be used as the sole basis for treatment or ?other patient management decisions. A negative result may occur with  ?improper specimen collection/handling, submission of specimen other ?than nasopharyngeal swab, presence of viral mutation(s) within the ?areas targeted by this assay, and inadequate number of viral ?copies(<138 copies/mL). A negative result must be combined with ?clinical observations, patient history, and epidemiological ?information. The expected result is Negative. ? ?Fact Sheet for Patients:   ?EntrepreneurPulse.com.au ? ?Fact Sheet for Healthcare Providers:  ?IncredibleEmployment.be ? ?This test is no t yet approved or cleared by the [place] and  ?has been authorized for detection and/or diagnos

## 2021-08-10 NOTE — Assessment & Plan Note (Signed)
-   management as per orthopedics,  plan to operate tomorrow ?   Keep nothing by mouth post midnight. ?Patient   not on anticoagulation or antiplatelet agents    ?Ordered type and screen,  order a vitamin D level ? ?Patient at baseline able to walk a flight of stairs or 100 feet  ?  Patient denies any chest pain or shortness of breath currently and/or with exertion, ? ECG showing st segment changes but no CP ?  known history of coronary artery disease  ? ?Given advanced age patient is at least moderate  risk   which has been discussed with family furthther cardiac workup is indicated. ?And stabilization ? will order echo  cycle CE  ?   Cardiology consult for further pre-op clearance given  NSVT ? ? ?

## 2021-08-10 NOTE — Progress Notes (Signed)
Initial Nutrition Assessment ? ?INTERVENTION:  ? ?-Needs weight for admission (last recorded 05/06/21) ? ?-Ensure Plus High Protein po BID, each supplement provides 350 kcal and 20 grams of protein.  ? ?-1 packet Juven BID, each packet provides 95 calories, 2.5 grams of protein (collagen), and 9.8 grams of carbohydrate (3 grams sugar); also contains 7 grams of L-arginine and L-glutamine, 300 mg vitamin C, 15 mg vitamin E, 1.2 mcg vitamin B-12, 9.5 mg zinc, 200 mg calcium, and 1.5 g  Calcium Beta-hydroxy-Beta-methylbutyrate to support wound healing  ? ?-Recommend regular diet once diet advanced ? ?-Multivitamin with minerals daily ? ?NUTRITION DIAGNOSIS:  ? ?Increased nutrient needs related to hip fracture, wound healing as evidenced by estimated needs. ? ?GOAL:  ? ?Patient will meet greater than or equal to 90% of their needs ? ?MONITOR:  ? ?Diet advancement, Labs, Weight trends, I & O's, Skin ? ?REASON FOR ASSESSMENT:  ? ?Consult ?Hip fracture protocol, Wound healing ? ?ASSESSMENT:  ? ?88 y.o. female with medical history significant of hypertension history of syncope cluster headaches, asthma, CAD, chronic back pain, depression, HLD, GERD, hypothyroidism, irritable bowel syndrome, history of melanoma  . Admitted for right hip fracture. ? ?Patient  not in room at time of visit. Will attempt to gather history at follow-up. ?Pt NPO today for IM nail of right femur.  ?Pt needs weight for this admission. No weight since December 2022.  ?Pt with new deep tissue pressure injury, will order Ensure and Juven supplements once diet advanced to aid in wound healing.  ? ?Pt chart review, pt takes the following supplements at home: calcium, Vitamin D, Vitamin B-12, and iron ? ?Medications: KCl ? ?Labs reviewed: ? Low K ? ?NUTRITION - FOCUSED PHYSICAL EXAM: ? ?Unable to complete, not in room. ? ?Diet Order:   ?Diet Order   ? ?       ?  Diet NPO time specified Except for: Sips with Meds  Diet effective now       ?  ? ?  ?  ? ?   ? ? ?EDUCATION NEEDS:  ? ?Not appropriate for education at this time ? ?Skin:  Skin Assessment: Skin Integrity Issues: ?Skin Integrity Issues:: Other (Comment) ?Other: Per WOC note, deep tissure pressure injury to sacrum ? ?Last BM:  3/20 ? ?Height:  ? ?Ht Readings from Last 1 Encounters:  ?05/06/21 5' 5.5" (1.664 m)  ? ? ?Weight:  ? ?Wt Readings from Last 1 Encounters:  ?05/06/21 56.2 kg  ? ? ?BMI:  There is no height or weight on file to calculate BMI. ? ?Estimated Nutritional Needs:  ? ?Kcal:  1450-1650 ? ?Protein:  70-80g ? ?Fluid:  1.6L/day ? ?Clayton Bibles, MS, RD, LDN ?Inpatient Clinical Dietitian ?Contact information available via Amion ? ?

## 2021-08-11 ENCOUNTER — Inpatient Hospital Stay (HOSPITAL_COMMUNITY): Payer: Medicare Other

## 2021-08-11 DIAGNOSIS — D649 Anemia, unspecified: Secondary | ICD-10-CM

## 2021-08-11 DIAGNOSIS — S72001A Fracture of unspecified part of neck of right femur, initial encounter for closed fracture: Secondary | ICD-10-CM | POA: Diagnosis not present

## 2021-08-11 LAB — CBC
HCT: 29.2 % — ABNORMAL LOW (ref 36.0–46.0)
Hemoglobin: 9.2 g/dL — ABNORMAL LOW (ref 12.0–15.0)
MCH: 27.7 pg (ref 26.0–34.0)
MCHC: 31.5 g/dL (ref 30.0–36.0)
MCV: 88 fL (ref 80.0–100.0)
Platelets: 222 10*3/uL (ref 150–400)
RBC: 3.32 MIL/uL — ABNORMAL LOW (ref 3.87–5.11)
RDW: 14.3 % (ref 11.5–15.5)
WBC: 8.4 10*3/uL (ref 4.0–10.5)
nRBC: 0 % (ref 0.0–0.2)

## 2021-08-11 LAB — BASIC METABOLIC PANEL
Anion gap: 8 (ref 5–15)
BUN: 32 mg/dL — ABNORMAL HIGH (ref 8–23)
CO2: 28 mmol/L (ref 22–32)
Calcium: 7.9 mg/dL — ABNORMAL LOW (ref 8.9–10.3)
Chloride: 105 mmol/L (ref 98–111)
Creatinine, Ser: 0.73 mg/dL (ref 0.44–1.00)
GFR, Estimated: >60 mL/min (ref >60)
Glucose, Bld: 168 mg/dL — ABNORMAL HIGH (ref 70–99)
Potassium: 2.9 mmol/L — ABNORMAL LOW (ref 3.5–5.1)
Sodium: 141 mmol/L (ref 135–145)

## 2021-08-11 LAB — POTASSIUM: Potassium: 4.1 mmol/L (ref 3.5–5.1)

## 2021-08-11 MED ORDER — POTASSIUM CHLORIDE CRYS ER 20 MEQ PO TBCR
40.0000 meq | EXTENDED_RELEASE_TABLET | Freq: Once | ORAL | Status: AC
  Administered 2021-08-11: 40 meq via ORAL
  Filled 2021-08-11: qty 2

## 2021-08-11 MED ORDER — METHOCARBAMOL 500 MG PO TABS
ORAL_TABLET | ORAL | Status: AC
Start: 2021-08-11 — End: 2021-08-11
  Filled 2021-08-11: qty 1

## 2021-08-11 MED ORDER — SODIUM CHLORIDE 0.9 % IV SOLN: INTRAVENOUS | Status: AC

## 2021-08-11 MED ORDER — ONDANSETRON HCL 4 MG PO TABS: 4.0000 mg | ORAL_TABLET | Freq: Four times a day (QID) | ORAL | Status: DC | PRN

## 2021-08-11 MED ORDER — DOCUSATE SODIUM 100 MG PO CAPS
100.0000 mg | ORAL_CAPSULE | Freq: Two times a day (BID) | ORAL | Status: DC
  Administered 2021-08-11 – 2021-08-21 (×19): 100 mg via ORAL
  Filled 2021-08-11 (×21): qty 1

## 2021-08-11 MED ORDER — MENTHOL 3 MG MT LOZG: 1.0000 | LOZENGE | OROMUCOSAL | Status: DC | PRN

## 2021-08-11 MED ORDER — VITAMIN D3 25 MCG (1000 UNIT) PO TABS
2000.0000 [IU] | ORAL_TABLET | Freq: Every day | ORAL | Status: DC
  Administered 2021-08-11 – 2021-08-21 (×10): 2000 [IU] via ORAL
  Filled 2021-08-11 (×11): qty 2

## 2021-08-11 MED ORDER — CALCIUM CARBONATE 1250 (500 CA) MG PO TABS
1250.0000 mg | ORAL_TABLET | Freq: Every day | ORAL | Status: DC
  Administered 2021-08-11 – 2021-08-17 (×6): 1250 mg via ORAL
  Filled 2021-08-11 (×10): qty 1

## 2021-08-11 MED ORDER — METOCLOPRAMIDE HCL 5 MG PO TABS: 5.0000 mg | ORAL_TABLET | Freq: Three times a day (TID) | ORAL | Status: DC | PRN

## 2021-08-11 MED ORDER — ASPIRIN EC 325 MG PO TBEC
325.0000 mg | DELAYED_RELEASE_TABLET | Freq: Every day | ORAL | Status: DC
  Administered 2021-08-11 – 2021-08-16 (×6): 325 mg via ORAL
  Filled 2021-08-11 (×6): qty 1

## 2021-08-11 MED ORDER — HYDROMORPHONE HCL 2 MG PO TABS
1.0000 mg | ORAL_TABLET | ORAL | Status: DC | PRN
  Administered 2021-08-11 – 2021-08-20 (×11): 1 mg via ORAL
  Filled 2021-08-11 (×12): qty 1

## 2021-08-11 MED ORDER — PHENOL 1.4 % MT LIQD: 1.0000 | OROMUCOSAL | Status: DC | PRN

## 2021-08-11 MED ORDER — ONDANSETRON HCL 4 MG/2ML IJ SOLN
4.0000 mg | Freq: Four times a day (QID) | INTRAMUSCULAR | Status: DC | PRN
  Administered 2021-08-16: 4 mg via INTRAVENOUS
  Filled 2021-08-11: qty 2

## 2021-08-11 MED ORDER — JUVEN PO PACK
1.0000 | PACK | Freq: Two times a day (BID) | ORAL | Status: DC
  Administered 2021-08-14: 1 via ORAL
  Filled 2021-08-11 (×13): qty 1

## 2021-08-11 MED ORDER — VITAMIN B-12 1000 MCG PO TABS
500.0000 ug | ORAL_TABLET | Freq: Every day | ORAL | Status: DC
  Administered 2021-08-11 – 2021-08-21 (×10): 500 ug via ORAL
  Filled 2021-08-11 (×11): qty 1

## 2021-08-11 MED ORDER — POTASSIUM CHLORIDE 20 MEQ PO PACK
20.0000 meq | PACK | Freq: Every day | ORAL | Status: DC
  Administered 2021-08-11 – 2021-08-17 (×5): 20 meq via ORAL
  Filled 2021-08-11 (×7): qty 1

## 2021-08-11 MED ORDER — SENNA 8.6 MG PO TABS
1.0000 | ORAL_TABLET | Freq: Two times a day (BID) | ORAL | Status: DC
  Administered 2021-08-11 – 2021-08-21 (×19): 8.6 mg via ORAL
  Filled 2021-08-11 (×20): qty 1

## 2021-08-11 MED ORDER — TAPENTADOL HCL 50 MG PO TABS
50.0000 mg | ORAL_TABLET | Freq: Three times a day (TID) | ORAL | Status: DC | PRN
  Administered 2021-08-12 – 2021-08-14 (×7): 50 mg via ORAL
  Filled 2021-08-11 (×8): qty 1

## 2021-08-11 MED ORDER — BOOST / RESOURCE BREEZE PO LIQD CUSTOM
1.0000 | Freq: Three times a day (TID) | ORAL | Status: DC
  Administered 2021-08-12 – 2021-08-18 (×3): 1 via ORAL

## 2021-08-11 MED ORDER — TRANEXAMIC ACID-NACL 1000-0.7 MG/100ML-% IV SOLN
1000.0000 mg | Freq: Once | INTRAVENOUS | Status: AC
  Administered 2021-08-11: 1000 mg via INTRAVENOUS
  Filled 2021-08-11: qty 100

## 2021-08-11 MED ORDER — OLANZAPINE 5 MG PO TBDP
2.5000 mg | ORAL_TABLET | Freq: Every day | ORAL | Status: DC | PRN
  Filled 2021-08-11: qty 0.5

## 2021-08-11 MED ORDER — CEFAZOLIN SODIUM-DEXTROSE 2-4 GM/100ML-% IV SOLN
2.0000 g | Freq: Four times a day (QID) | INTRAVENOUS | Status: AC
  Administered 2021-08-11 (×2): 2 g via INTRAVENOUS
  Filled 2021-08-11 (×3): qty 100

## 2021-08-11 MED ORDER — METOCLOPRAMIDE HCL 5 MG/ML IJ SOLN: 5.0000 mg | Freq: Three times a day (TID) | INTRAMUSCULAR | Status: DC | PRN

## 2021-08-11 MED ORDER — POTASSIUM CHLORIDE CRYS ER 20 MEQ PO TBCR: 20.0000 meq | EXTENDED_RELEASE_TABLET | Freq: Every day | ORAL | Status: DC

## 2021-08-11 MED ORDER — HYDROXYZINE HCL 10 MG PO TABS
10.0000 mg | ORAL_TABLET | Freq: Three times a day (TID) | ORAL | Status: DC | PRN
  Administered 2021-08-11 – 2021-08-21 (×4): 10 mg via ORAL
  Filled 2021-08-11 (×5): qty 1

## 2021-08-11 MED ORDER — ADULT MULTIVITAMIN W/MINERALS CH
1.0000 | ORAL_TABLET | Freq: Every day | ORAL | Status: DC
  Administered 2021-08-11 – 2021-08-17 (×6): 1 via ORAL
  Filled 2021-08-11 (×10): qty 1

## 2021-08-11 MED ORDER — ROSUVASTATIN CALCIUM 10 MG PO TABS
10.0000 mg | ORAL_TABLET | Freq: Every day | ORAL | Status: DC
Start: 2021-08-11 — End: 2021-08-21
  Administered 2021-08-11 – 2021-08-20 (×10): 10 mg via ORAL
  Filled 2021-08-11 (×10): qty 1

## 2021-08-11 MED ORDER — BIOTIN 5 MG PO CAPS: 5.0000 mg | ORAL_CAPSULE | Freq: Every day | ORAL | Status: DC

## 2021-08-11 MED ORDER — HYDROMORPHONE HCL 1 MG/ML IJ SOLN
0.5000 mg | INTRAMUSCULAR | Status: DC | PRN
  Administered 2021-08-13 – 2021-08-18 (×3): 0.5 mg via INTRAVENOUS
  Filled 2021-08-11 (×4): qty 0.5

## 2021-08-11 NOTE — Evaluation (Signed)
Occupational Therapy Evaluation ?Patient Details ?Name: Kerri [name] ?MRN: [id number] ?DOB: 04-08-1930 ?Today's Date: 08/11/2021 ? ? ?History of Present Illness Patient is an 88 year old female admitted after fall s/p IM nail. PMH: hypertension history of syncope cluster headaches, asthma, CAD, chronic back pain, depression, HLD, GERD, hypothyroidism, irritable bowel syndrome, history of melanoma  ? ?Clinical Impression ?  ?Patient lives at home alone and is typically independent with self care tasks. Per daughter patient likely to D/C home with her and daughter's home is handicap accessible. Daughter also states she recently had cancer treatments and spouse is disabled therefore there would be limited physical assistance available/may need to hire additional help. Patient is max to total A +2 for bed mobility and sit to stand at edge of bed with significantly flexed posture. Patient needing min to mod A for sitting balance during oral care and brushing her hair. Recommend rehab at discharge as patient needing extensive physical assistance.  ?   ? ?Recommendations for follow up therapy are one component of a multi-disciplinary discharge planning process, led by the attending physician.  Recommendations may be updated based on patient status, additional functional criteria and insurance authorization.  ? ?Follow Up Recommendations ? Skilled nursing-short term rehab (<3 hours/day)  ?  ?Assistance Recommended at Discharge Frequent or constant Supervision/Assistance  ?Patient can return home with the following Two people to help with walking and/or transfers;Two people to help with bathing/dressing/bathroom;Assistance with cooking/housework;Assist for transportation;Help with stairs or ramp for entrance ? ?  ?Functional Status Assessment ? Patient has had a recent decline in their functional status and demonstrates the ability to make significant improvements in function in a reasonable and predictable amount of time.   ?Equipment Recommendations ? BSC/3in1  ?  ?   ?Precautions / Restrictions Precautions ?Precautions: Fall ?Restrictions ?Weight Bearing Restrictions: Yes ?RLE Weight Bearing: Weight bearing as tolerated  ? ?  ? ?Mobility Bed Mobility ?Overal bed mobility: Needs Assistance ?Bed Mobility: Supine to Sit, Sit to Supine ?  ?  ?Supine to sit: Total assist, +2 for physical assistance, +2 for safety/equipment ?Sit to supine: Total assist, +2 for physical assistance, +2 for safety/equipment ?  ?General bed mobility comments: Patient minimally providing efforts 2* pain fearful of pain. Use of bed pad to transition supine <> sit ?  ? ? ? ?  ?Balance Overall balance assessment: History of Falls, Needs assistance ?Sitting-balance support: Feet supported, Single extremity supported, No upper extremity supported ?Sitting balance-Leahy Scale: Poor ?Sitting balance - Comments: needing min to mod A ?Postural control: Posterior lean ?Standing balance support: Bilateral upper extremity supported, During functional activity ?Standing balance-Leahy Scale: Zero ?Standing balance comment: max +2 ?  ?  ?  ?  ?  ?  ?  ?  ?  ?  ?  ?   ? ?ADL either performed or assessed with clinical judgement  ? ?ADL Overall ADL's : Needs assistance/impaired ?Eating/Feeding: Sitting;Set up ?  ?Grooming: Brushing hair;Minimal assistance;Oral care;Sitting ?Grooming Details (indicate cue type and reason): Min A for sitting balance at edge of bed ?Upper Body Bathing: Moderate assistance;Sitting;Bed level ?  ?Lower Body Bathing: Total assistance;Sitting/lateral leans;Bed level ?  ?Upper Body Dressing : Moderate assistance;Sitting;Bed level ?  ?Lower Body Dressing: Total assistance;Sitting/lateral leans;Bed level ?  ?Toilet Transfer: Maximal assistance;+2 for physical assistance;+2 for safety/equipment ?Toilet Transfer Details (indicate cue type and reason): Patient perform sit to stand twice needing max +2 with patient providing minimal effort. Significantly  flexed position ?Toileting- Clothing Manipulation and Hygiene:  Total assistance;Bed level;Sit to/from stand;Sitting/lateral lean ?Toileting - Clothing Manipulation Details (indicate cue type and reason): Patient in semi standing position at edge of bed needing total A for perianal care due to bowel movement ?  ?  ?Functional mobility during ADLs: Maximal assistance;+2 for physical assistance;+2 for safety/equipment;Rolling walker (2 wheels) ?General ADL Comments: Patient needing significant assistance for all self care tasks due to pain, impaired balance, activity tolerance, safety awareness  ? ? ? ? ?Pertinent Vitals/Pain Pain Assessment ?Pain Assessment: Faces ?Faces Pain Scale: Hurts whole lot ?Pain Location: R hip with mobility ?Pain Descriptors / Indicators: Grimacing, Guarding, Discomfort ?Pain Intervention(s): Premedicated before session  ? ? ? ?Hand Dominance Right ?  ?Extremity/Trunk Assessment Upper Extremity Assessment ?Upper Extremity Assessment: Generalized weakness ?  ?Lower Extremity Assessment ?Lower Extremity Assessment: Defer to PT evaluation ?  ?  ?  ?Communication Communication ?Communication: No difficulties ?  ?Cognition Arousal/Alertness: Awake/alert ?Behavior During Therapy: Anxious ?Overall Cognitive Status: Within Functional Limits for tasks assessed ?  ?  ?  ?  ?  ?  ?  ?  ?  ?  ?  ?  ?  ?  ?  ?  ?General Comments: Patient is alert and oriented however particular/demanding ?  ?  ?   ?   ?   ? ? ?Home Living Family/patient expects to be discharged to:: Private residence ?Living Arrangements: Alone ?Available Help at Discharge: Family ?Type of Home: House ?Home Access: Ramped entrance (1/2 step after ramp) ?  ?  ?Home Layout: One level ?  ?  ?Bathroom Shower/Tub: Walk-in shower ?  ?Bathroom Toilet: Handicapped height ?  ?  ?Home Equipment: Conservation officer, nature (2 wheels);Cane - single point;Shower seat;Grab bars - tub/shower ?  ?Additional Comments: Info pertaining to daughter's house as daughter  states likely patient will go home with her. However daughter recently had cancer treatments and daughter's spouse is disabled therefore limited physical assist at home ?  ? ?  ?Prior Functioning/Environment Prior Level of Function : Independent/Modified Independent ?  ?  ?  ?  ?  ?  ?  ?  ?  ? ?  ?  ?OT Problem List: Decreased strength;Decreased activity tolerance;Impaired balance (sitting and/or standing);Decreased safety awareness;Pain ?  ?   ?OT Treatment/Interventions: Self-care/ADL training;DME and/or AE instruction;Therapeutic activities;Patient/family education;Balance training  ?  ?OT Goals(Current goals can be found in the care plan section) Acute Rehab OT Goals ?Patient Stated Goal: Brush dentures ?OT Goal Formulation: With patient ?Time For Goal Achievement: 08/25/21 ?Potential to Achieve Goals: Good  ?OT Frequency: Min 2X/week ?  ? ?Co-evaluation PT/OT/SLP Co-Evaluation/Treatment: Yes ?Reason for Co-Treatment: For patient/therapist safety;To address functional/ADL transfers ?PT goals addressed during session: Mobility/safety with mobility ?OT goals addressed during session: ADL's and self-care ?  ? ?  ?AM-PAC OT "6 Clicks" Daily Activity     ?Outcome Measure Help from another person eating meals?: A Little ?Help from another person taking care of personal grooming?: A Little ?Help from another person toileting, which includes using toliet, bedpan, or urinal?: Total ?Help from another person bathing (including washing, rinsing, drying)?: A Lot ?Help from another person to put on and taking off regular upper body clothing?: A Lot ?Help from another person to put on and taking off regular lower body clothing?: Total ?6 Click Score: 12 ?  ?End of Session Equipment Utilized During Treatment: Oxygen ?Nurse Communication: Mobility status ? ?Activity Tolerance: Patient limited by pain ?Patient left: in bed;with call bell/phone within reach;with bed alarm set;with family/visitor  present ? ?OT Visit Diagnosis:  Unsteadiness on feet (R26.81);Other abnormalities of gait and mobility (R26.89);Muscle weakness (generalized) (M62.81);History of falling (Z91.81)  ?              ?Time: 1323-1401 ?OT Time Calculation (min): 38 mi

## 2021-08-11 NOTE — Evaluation (Signed)
Physical Therapy Evaluation ?Patient Details ?Name: Kerri [name] ?MRN: [id number] ?DOB: 09-Jul-1929 ?Today's Date: 08/11/2021 ? ?History of Present Illness ? Patient is an 88 year old female admitted after fall s/p IM nail. PMH: hypertension history of syncope cluster headaches, asthma, CAD, chronic back pain, depression, HLD, GERD, hypothyroidism, irritable bowel syndrome, history of melanoma ? ?  ?Clinical Impression ? Kerri [name] is a 88 y.o. female POD 0 s/p Rt ORIF for femoral neck fracture. Pt's daughter reports pt is independent with RW for mobility at baseline. Patient is now limited by functional impairments (see PT problem list below) and requires Max-Total +2 assist for bed mobility and sit<>stand transfer with RW. Patient was limited by weakness, pain, and fear of mobilizing. Pt required Mod assist to maintain seated balance at EOB to complete self care with OT. Patient lives alone and is unsafe to return home alone. Her daughter does not feel she can provide the physical assist the pt requires at this time. Patient will benefit from continued skilled PT interventions to address impairments and progress independence with mobility, recommending SNF. Acute PT will follow and progress as able.    ?   ? ?Recommendations for follow up therapy are one component of a multi-disciplinary discharge planning process, led by the attending physician.  Recommendations may be updated based on patient status, additional functional criteria and insurance authorization. ? ?Follow Up Recommendations Skilled nursing-short term rehab (<3 hours/day) ? ?  ?Assistance Recommended at Discharge Frequent or constant Supervision/Assistance  ?Patient can return home with the following ? Two people to help with walking and/or transfers;Two people to help with bathing/dressing/bathroom;Assistance with cooking/housework;Assistance with feeding;Direct supervision/assist for medications management;Assist for transportation;Help with stairs or  ramp for entrance ? ?  ?Equipment Recommendations None recommended by PT  ?Recommendations for Other Services ?    ?  ?Functional Status Assessment Patient has had a recent decline in their functional status and demonstrates the ability to make significant improvements in function in a reasonable and predictable amount of time.  ? ?  ?Precautions / Restrictions Precautions ?Precautions: Fall ?Restrictions ?Weight Bearing Restrictions: Yes ?RLE Weight Bearing: Weight bearing as tolerated  ? ?  ? ?Mobility ? Bed Mobility ?Overal bed mobility: Needs Assistance ?Bed Mobility: Supine to Sit, Sit to Supine ?  ?  ?Supine to sit: +2 for physical assistance, +2 for safety/equipment, Max assist ?Sit to supine: Total assist, +2 for physical assistance, +2 for safety/equipment ?  ?General bed mobility comments: Max assist +2 due to pt's fear of pain and pt's particularity with mobility. Bed pad required to pivot hips to EOB. Total assist to return to supine. ?  ? ?Transfers ?Overall transfer level: Needs assistance ?Equipment used: 2 person hand held assist ?Transfers: Sit to/from Stand ?Sit to Stand: Mod assist, +2 physical assistance, +2 safety/equipment ?  ?  ?  ?  ?  ?General transfer comment: pt completed sit<>stand with 2+ mod assist for power up from EOB with HHA. pt completed 2 stands and slight side scoot towards HOB but unable to step feet above ground. ?  ? ?Ambulation/Gait ?  ?  ?  ?  ?  ?  ?  ?  ? ?Stairs ?  ?  ?  ?  ?  ? ?Wheelchair Mobility ?  ? ?Modified Rankin (Stroke Patients Only) ?  ? ?  ? ?Balance Overall balance assessment: History of Falls, Needs assistance ?Sitting-balance support: Feet supported, Single extremity supported, No upper extremity supported ?Sitting balance-Leahy Scale: Poor ?Sitting  balance - Comments: needing min to mod A ?Postural control: Posterior lean ?Standing balance support: Bilateral upper extremity supported, During functional activity ?Standing balance-Leahy Scale: Zero ?Standing  balance comment: max +2 ?  ?  ?  ?  ?  ?  ?  ?  ?  ?  ?  ?   ? ? ? ?Pertinent Vitals/Pain Pain Assessment ?Pain Assessment: Faces ?Faces Pain Scale: Hurts whole lot ?Pain Location: R hip with mobility ?Pain Descriptors / Indicators: Grimacing, Guarding, Discomfort ?Pain Intervention(s): Monitored during session, Limited activity within patient's tolerance, Premedicated before session, Repositioned  ? ? ?Home Living Family/patient expects to be discharged to:: Private residence ?Living Arrangements: Alone ?Available Help at Discharge: Family ?Type of Home: House ?Home Access: Ramped entrance (1/2 step after ramp) ?  ?  ?  ?Home Layout: One level ?Home Equipment: Conservation officer, nature (2 wheels);Cane - single point;Shower seat;Grab bars - tub/shower ?Additional Comments: Info pertaining to daughter's house as daughter states likely patient will go home with her. However daughter recently had cancer treatments and daughter's spouse is disabled therefore limited physical assist at home.  ?  ?Prior Function Prior Level of Function : Independent/Modified Independent ?  ?  ?  ?  ?  ?  ?  ?  ?  ? ? ?Hand Dominance  ? Dominant Hand: Right ? ?  ?Extremity/Trunk Assessment  ? Upper Extremity Assessment ?Upper Extremity Assessment: Defer to OT evaluation ?  ? ?Lower Extremity Assessment ?Lower Extremity Assessment: Generalized weakness;RLE deficits/detail;LLE deficits/detail ?RLE Deficits / Details: pt with frail skin and low muscle mass on LE's. pt with little initiation of LE's and required assist/encouragement to bring LE's off EOB. grossly 3/5 or less. ?LLE Deficits / Details: pt with frail skin and low muscle mass on LE's. pt with little initiation of LE's and required assist/encouragement to bring LE's off EOB. grossly 3/5 or less. ?  ? ?Cervical / Trunk Assessment ?Cervical / Trunk Assessment: Kyphotic  ?Communication  ? Communication: No difficulties  ?Cognition Arousal/Alertness: Awake/alert ?Behavior During Therapy:  Anxious ?Overall Cognitive Status: Within Functional Limits for tasks assessed ?  ?  ?  ?  ?  ?  ?  ?  ?  ?  ?  ?  ?  ?  ?  ?  ?General Comments: Patient is alert and oriented however particular/demanding ?  ?  ? ?  ?General Comments   ? ?  ?Exercises    ? ?Assessment/Plan  ?  ?PT Assessment Patient needs continued PT services  ?PT Problem List Decreased strength;Decreased range of motion;Decreased activity tolerance;Decreased balance;Decreased mobility;Decreased knowledge of use of DME;Decreased knowledge of precautions ? ?   ?  ?PT Treatment Interventions DME instruction;Gait training;Stair training;Functional mobility training;Therapeutic activities;Therapeutic exercise;Balance training;Neuromuscular re-education;Patient/family education   ? ?PT Goals (Current goals can be found in the Care Plan section)  ?Acute Rehab PT Goals ?Patient Stated Goal: pt didn't state specifically. daughter wants rehab. ?PT Goal Formulation: With patient ?Time For Goal Achievement: 08/25/21 ?Potential to Achieve Goals: Fair ? ?  ?Frequency 7X/week ?  ? ? ?Co-evaluation   ?Reason for Co-Treatment: For patient/therapist safety;To address functional/ADL transfers ?PT goals addressed during session: Mobility/safety with mobility;Balance ?OT goals addressed during session: ADL's and self-care ?  ? ? ?  ?AM-PAC PT "6 Clicks" Mobility  ?Outcome Measure Help needed turning from your back to your side while in a flat bed without using bedrails?: Total ?Help needed moving from lying on your back to sitting on the side of  a flat bed without using bedrails?: Total ?Help needed moving to and from a bed to a chair (including a wheelchair)?: Total ?Help needed standing up from a chair using your arms (e.g., wheelchair or bedside chair)?: Total ?Help needed to walk in hospital room?: Total ?Help needed climbing 3-5 steps with a railing? : Total ?6 Click Score: 6 ? ?  ?End of Session Equipment Utilized During Treatment: Gait belt ?Activity Tolerance:  Patient tolerated treatment well ?Patient left: in chair;with call bell/phone within reach;with chair alarm set;with family/visitor present ?Nurse Communication: Mobility status ?PT Visit Diagnosis: Muscle we

## 2021-08-11 NOTE — Progress Notes (Signed)
Nutrition Follow-up ? ?DOCUMENTATION CODES:  ? ?Severe malnutrition in context of chronic illness ? ?INTERVENTION:  ? ?-Boost Breeze po TID, each supplement provides 250 kcal and 9 grams of protein ? ?-1 packet Juven BID, each packet provides 95 calories, 2.5 grams of protein (collagen), and 9.8 grams of carbohydrate (3 grams sugar); also contains 7 grams of L-arginine and L-glutamine, 300 mg vitamin C, 15 mg vitamin E, 1.2 mcg vitamin B-12, 9.5 mg zinc, 200 mg calcium, and 1.5 g  Calcium Beta-hydroxy-Beta-methylbutyrate to support wound healing  ? ?-Multivitamin with minerals daily ? ?NEW NUTRITION DIAGNOSIS:  ? ?Severe Malnutrition related to chronic illness as evidenced by severe fat depletion, severe muscle depletion. ? ?GOAL:  ? ?Patient will meet greater than or equal to 90% of their needs ? ?Progressing. ? ?MONITOR:  ? ?Diet advancement, Labs, Weight trends, I & O's, Skin ? ? ?ASSESSMENT:  ? ?88 y.o. female with medical history significant of hypertension history of syncope cluster headaches, asthma, CAD, chronic back pain, depression, HLD, GERD, hypothyroidism, irritable bowel syndrome, history of melanoma  . Admitted for right hip fracture. ? ?3/22: s/p IM fixation of right femur ? ?Patient in room, very irritable. Pt reports she eats well at home. Following her fall she states she was on the floor for 4 days PTA. Pt complaining that she doesn't like the hospital food. Pt does not want supplements.Offered Boost Breeze since pt is lactose intolerant. Reports she ate ice cream last night and she had diarrhea. States she sometimes has issues swallowing d/t build up of mucous in back of her throat. ? ?Assisted patient with choosing options on menu for lunch. Pt's daughter entered room. States she will provide pt with her hot tea she prefers. Pt's daughter would like her to receive protein supplements to support wound and post-op healing.  ?Will order Boost Breeze and Juven supplements.  ? ?Admission weight: 129  lbs. ?Pt states her weight has remained stable. UBW ~125 lbs. ? ?Medications: OSCAL, Vitamin D, Colace, KLOR-CON, Senokot, Vitamin B-12 ? ?Labs reviewed: ?Low K ? ?NUTRITION - FOCUSED PHYSICAL EXAM: ? ?Flowsheet Row Most Recent Value  ?Orbital Region Severe depletion  ?Upper Arm Region Severe depletion  ?Thoracic and Lumbar Region Severe depletion  ?Buccal Region Severe depletion  ?Temple Region Severe depletion  ?Clavicle Bone Region Severe depletion  ?Clavicle and Acromion Bone Region Severe depletion  ?Scapular Bone Region Severe depletion  ?Dorsal Hand Severe depletion  ?Patellar Region Severe depletion  ?Anterior Thigh Region Severe depletion  ?Posterior Calf Region Severe depletion  ?Edema (RD Assessment) None  ?Hair Reviewed  ?Eyes Reviewed  ?Mouth Reviewed  ?Skin Reviewed  ? ?  ? ? ?Diet Order:   ?Diet Order   ? ?       ?  Diet regular Room service appropriate? Yes; Fluid consistency: Thin  Diet effective now       ?  ? ?  ?  ? ?  ? ? ?EDUCATION NEEDS:  ? ?Not appropriate for education at this time ? ?Skin:  Skin Assessment: Skin Integrity Issues: ?Skin Integrity Issues:: Other (Comment), Incisions ?Incisions: 3/22 right hip ?Other: Per WOC note, deep tissure pressure injury to sacrum ? ?Last BM:  3/23 -type 7 ? ?Height:  ? ?Ht Readings from Last 1 Encounters:  ?08/10/21 5' 5.5" (1.664 m)  ? ? ?Weight:  ? ?Wt Readings from Last 1 Encounters:  ?08/10/21 58.8 kg  ? ? ?BMI:  Body mass index is 21.24 kg/m?. ? ?Estimated Nutritional Needs:  ? ?  Kcal:  1450-1650 ? ?Protein:  70-80g ? ?Fluid:  1.6L/day ? ? ?Kerri Bibles, MS, RD, LDN ?Inpatient Clinical Dietitian ?Contact information available via Amion ? ?

## 2021-08-11 NOTE — [place] FL2 (Signed)
?[place] MEDICAID FL2 LEVEL OF CARE SCREENING TOOL  ?  ? ?IDENTIFICATION  ?Patient Name: ?Kerri [name] Birthdate: 03-05-1930 Sex: female Admission Date (Current Location): ?08/09/2021  ?[place] and [place] Number: ? [place] ?  Facility and Address:  ?[facility],  [address] [place], [place] ?     Provider Number: ?[id number]  ?Attending Physician Name and Address:  ?Antonieta Pert, MD ? Relative Name and Phone Number:  ?Isidore Moos (Daughter)   [phone] ?   ?Current Level of Care: ?Hospital Recommended Level of Care: ?[place] Prior Approval Number: ?  ? ?Date Approved/Denied: ?  PASRR Number: ?[id number] A ? ?Discharge Plan: ?SNF ?  ? ?Current Diagnoses: ?Patient Active Problem List  ? Diagnosis Date Noted  ? Normocytic anemia 08/11/2021  ? NSVT (nonsustained ventricular tachycardia) 08/10/2021  ? Elevated troponin 08/10/2021  ? UTI (urinary tract infection) 08/10/2021  ? Hip fracture ([place]) 08/09/2021  ? Elevated CK 08/09/2021  ? Dehydration 08/09/2021  ? SOB (shortness of breath) 04/07/2020  ? Primary osteoarthritis of right knee 10/06/2019  ? Palpitations 03/29/2019  ? Failed total hip arthroplasty ([place]) 11/27/2018  ? Melanoma ([place])   ? Dizziness 01/30/2017  ? Lumbar stenosis with neurogenic claudication 11/13/2013  ? Sebaceous cyst 11/20/2012  ? Squamous cell carcinoma in situ of skin of calf 04/03/2012  ?   Hypokalemia 09/08/2011  ? Postop Acute blood loss anemia 09/05/2011  ? Osteoarthritis of hip 09/04/2011  ? Melanoma of lower leg ([place]) 05/03/2011  ? PVC's (premature ventricular contractions) 07/30/2009  ? Hypothyroidism 05/10/2009  ? Hyperlipidemia 05/10/2009  ? Essential hypertension 05/10/2009  ? Coronary atherosclerosis 05/10/2009  ? OA (osteoarthritis) of knee 05/10/2009  ? SPINAL STENOSIS 05/10/2009  ? Carotid bruit 05/10/2009  ? ? ?Orientation RESPIRATION BLADDER Height & Weight   ?  ?Self, Situation, Place ? O2 (2L [place]) External catheter Weight: 58.8 kg ?Height:  5'  5.5" (166.4 cm)  ?BEHAVIORAL SYMPTOMS/MOOD NEUROLOGICAL BOWEL NUTRITION STATUS  ?    Incontinent Diet (see d/c summary)  ?AMBULATORY STATUS COMMUNICATION OF NEEDS Skin   ?Extensive Assist Verbally Other (Comment) (Wound, sacrum) ?  ?  ?  ?    ?     ?     ? ? ?Personal Care Assistance Level of Assistance  ?Bathing, Feeding, Dressing Bathing Assistance: Maximum assistance ?Feeding assistance: Independent ?Dressing Assistance: Maximum assistance ?   ? ?Functional Limitations Info  ?Sight, Hearing, Speech Sight Info: Adequate ?Hearing Info: Adequate ?Speech Info: Adequate  ? ? ?SPECIAL CARE FACTORS FREQUENCY  ?PT (By licensed PT), OT (By licensed OT)   ?  ?PT Frequency: 5X/W ?OT Frequency: 5X/W ?  ?  ?  ?   ? ? ?Contractures Contractures Info: Not present  ? ? ?Additional Factors Info  ?Code Status, Allergies Code Status Info: full ?Allergies Info: Buprenorphine Hcl, Codeine, Morphine, Morphine And Related, Tramadol, Adhesive (Tape), Doxycycline Hyclate, Hydrocodone-acetaminophen, Lactose Intolerance (Gi), Nucynta (Tapentadol), Oxycodone ?  ?  ?  ?   ? ?Current Medications (08/11/2021):  This is the current hospital active medication list ?Current Facility-Administered Medications  ?Medication Dose Route Frequency Provider Last Rate Last Admin  ? aspirin EC tablet 325 mg  325 mg Oral Q breakfast Rod Can, MD   325 mg at 08/11/21 0857  ? calcium carbonate (OS-CAL - dosed in mg of elemental calcium) tablet 1,250 mg  1,250 mg Oral Daily Kc, Ramesh, MD   1,250 mg at 08/11/21 1327  ? cefTRIAXone (ROCEPHIN) 1 g in sodium chloride 0.9 %  100 mL IVPB  1 g Intravenous Q24H Rod Can, MD 200 mL/hr at 08/11/21 0215 1 g at 08/11/21 0215  ? cholecalciferol (VITAMIN D) tablet 2,000 Units  2,000 Units Oral Daily Antonieta Pert, MD   2,000 Units at 08/11/21 1327  ? docusate sodium (COLACE) capsule 100 mg  100 mg Oral BID Rod Can, MD   100 mg at 08/11/21 0148  ? gabapentin (NEURONTIN) capsule 300 mg  300 mg Oral TID  Rod Can, MD   300 mg at 08/11/21 0859  ? HYDROmorphone (DILAUDID) injection 0.5 mg  0.5 mg Intravenous Q4H PRN Swinteck, Aaron Edelman, MD      ? HYDROmorphone (DILAUDID) tablet 1 mg  1 mg Oral Q4H PRN Rod Can, MD   1 mg at 08/11/21 1327  ? hydrOXYzine (ATARAX) tablet 10 mg  10 mg Oral TID PRN Antonieta Pert, MD   10 mg at 08/11/21 1327  ? levothyroxine (SYNTHROID) tablet 75 mcg  75 mcg Oral Q0600 Rod Can, MD   75 mcg at 08/11/21 0525  ? menthol-cetylpyridinium (CEPACOL) lozenge 3 mg  1 lozenge Oral PRN Swinteck, Aaron Edelman, MD      ? Or  ? phenol (CHLORASEPTIC) mouth spray 1 spray  1 spray Mouth/Throat PRN Swinteck, Aaron Edelman, MD      ? methocarbamol (ROBAXIN) tablet 500 mg  500 mg Oral Q6H PRN Rod Can, MD   500 mg at 08/11/21 0859  ? Or  ? methocarbamol (ROBAXIN) 500 mg in dextrose 5 % 50 mL IVPB  500 mg Intravenous Q6H PRN Swinteck, Aaron Edelman, MD      ? metoCLOPramide (REGLAN) tablet 5-10 mg  5-10 mg Oral Q8H PRN Swinteck, Aaron Edelman, MD      ? Or  ? metoCLOPramide (REGLAN) injection 5-10 mg  5-10 mg Intravenous Q8H PRN Swinteck, Aaron Edelman, MD      ? OLANZapine zydis (ZYPREXA) disintegrating tablet 2.5 mg  2.5 mg Oral Daily PRN Kc, Maren [place], MD      ? ondansetron (ZOFRAN) tablet 4 mg  4 mg Oral Q6H PRN Swinteck, Aaron Edelman, MD      ? Or  ? ondansetron (ZOFRAN) injection 4 mg  4 mg Intravenous Q6H PRN Swinteck, Aaron Edelman, MD      ? polyethylene glycol (MIRALAX / GLYCOLAX) packet 17 g  17 g Oral Daily PRN Swinteck, Aaron Edelman, MD      ? potassium chloride (KLOR-CON) packet 20 mEq  20 mEq Oral Daily Kc, Ramesh, MD   20 mEq at 08/11/21 1224  ? rosuvastatin (CRESTOR) tablet 10 mg  10 mg Oral QHS Kc, Ramesh, MD      ? senna (SENOKOT) tablet 8.6 mg  1 tablet Oral BID Swinteck, Aaron Edelman, MD      ? tapentadol (NUCYNTA) tablet 50 mg  50 mg Oral Q8H PRN Kc, Ramesh, MD      ? vitamin B-12 (CYANOCOBALAMIN) tablet 500 mcg  500 mcg Oral Daily Kc, Ramesh, MD   500 mcg at 08/11/21 1327  ? ? ? ?Discharge Medications: ?Please see discharge summary for  a list of discharge medications. ? ?Relevant Imaging Results: ? ?Relevant Lab Results: ? ? ?Additional Information ?[phone] ? ?Trish Mage, LCSW ? ? ? ? ?

## 2021-08-11 NOTE — Anesthesia Postprocedure Evaluation (Signed)
Anesthesia Post Note ? ?Patient: Kerri [name] ? ?Procedure(s) Performed: INTRAMEDULLARY (IM) NAIL FEMORAL (Right: Hip) ? ?  ? ?Patient location during evaluation: PACU ?Anesthesia Type: General ?Level of consciousness: awake ?Pain management: pain level controlled ?Vital Signs Assessment: post-procedure vital signs reviewed and stable ?Respiratory status: spontaneous breathing, nonlabored ventilation, respiratory function stable and patient connected to nasal cannula oxygen ?Cardiovascular status: blood pressure returned to baseline and stable ?Postop Assessment: no apparent nausea or vomiting ?Anesthetic complications: no ? ? ?No notable events documented. ? ?Last Vitals:  ?Vitals:  ? 08/11/21 1537 08/11/21 2022  ?BP: 108/61 113/65  ?Pulse: 67 97  ?Resp: 15 14  ?Temp: 37 ?C 36.8 ?C  ?SpO2: 100% 97%  ?  ?Last Pain:  ?Vitals:  ? 08/11/21 2022  ?TempSrc: Oral  ?PainSc:   ? ? ?  ?  ?  ?  ?  ?  ? ?Bren Borys P Emani Morad ? ? ? ? ?

## 2021-08-11 NOTE — Progress Notes (Signed)
?PROGRESS NOTE ?Kerri [name]  VWU:[id number] DOB: 01/06/1930 DOA: 08/09/2021 ?PCP: Lavone Orn, MD  ? ?Brief Narrative/Hospital Course: ?88 y.o. female with medical history significant of hypertension history of syncope cluster headaches, asthma, CAD, chronic back pain, depression, HLD, GERD, hypothyroidism, irritable bowel syndrome, history of melanoma presented to the ED after a fall that was 5 days PTA a and could not get up.  Per report while going to the shower tripped on her shoes and fell down and hurt her hip back, did not have a fall to call anybody. She is admitted for  Closed 2-part intertrochanteric fracture of right femur, traumatic rhabdomyolysis. Had abnormal ecg and pt could not provide hx,and had small run of NSVT, patient was admitted for orthopedic evaluation, cardiology was consulted for preoperative evaluation.  In the ED CT head no acute finding, but with severe atherosclerotic plaque of the carotid arteries within the neck, chest x-ray no acute finding CT abdomen pelvis no acute finding, CTA chest no evidence of infiltrate.  ?  ?Subjective: ?Doing examined this morning.  Finishing her potassium supplement.  She has been at times verbally aggressive. ?Complaining that "food is nasty" ? ?Assessment and Plan: ?Principal Problem: ?  Hip fracture ([place]) ?Active Problems: ?    Hypokalemia ?  Elevated CK ?  Elevated troponin ?  UTI (urinary tract infection) ?  Hypothyroidism ?  Hyperlipidemia ?  Essential hypertension ?  Coronary atherosclerosis ?  Carotid bruit ?  Dehydration ?  NSVT (nonsustained ventricular tachycardia) ?  Normocytic anemia ?  ?Closed 2 part intertrochanteric fracture of the right femur status post mechanical fall: Seen by cardiology and preop underwent echocardiogram and carotid duplex.  Underwent IM fixation right femur by Dr. Lyla Glassing 3/22-continue postop pain control DVT prophylaxis PT OT as per orthopedics.  Placed on aspirin 325 daily, Robaxin, Dilaudid p.o./IV, stool softener  and MiraLAX as needed by orthopedics team. ? ?Traumatic rhabdomyolysis: CK on admission 1231, downtrending 1050-recheck.  Continue hydration ?Dehydration: Continue IV fluids encourage oral intake ?Hypokalemia: Being repleted aggressively.  Recheck this afternoon ? ?Normocytic anemia-hemoglobin back in January was 12.2 g but before that in May/25/21- 8.9 g.  Now downtrending at 9.2 g suspect current of postop acute blood loss elevated IV fluid hydration.  monitor closely for postop acute blood loss.  check anemia panel ?Recent Labs  ?Lab 08/09/21 ?1906 08/10/21 ?0450 08/11/21 ?0439  ?HGB 12.6 10.9* 9.2*  ?HCT 38.5 34.4* 29.2*  ?  ?Abnormal EKG-with ST depression diffuse ?NSVT  ?CAD: ?Mildly elevated troponin 141-1 52: Likely demand ischemia. ?Cardiology consulted appreciate input denies chest pain.  Echo pending ? ?Hyperlipidemia on Crestor-hold for rhabdomyolysis ?Hypertension on chlorthalidone last echo with normal EF in 2020.  BP is well controlled diuretics on hold ?Atherosclerotic plaque in the carotid arteries in the CT scan incidentally noted, carotid duplex no acute finding. ? ?Hypothyroidism on Synthroid.  Euthyroid with TSH 1.4 ? ?Leukocytosis/UTI:UA with WBC 21-50, urine culture w/ GNR >100,0000. cont ceftriaxone pending urine culture ? ?DVT prophylaxis: SCDs Start: 08/11/21 0121 ASA 325 mg per ortho ?Code Status:   Code Status: Full Code ?Family Communication: plan of care discussed with patient/daughter at bedside 3/22. ?Patient status is: Inpatient level of care: Telemetry  ?Remains inpatient because: Ongoing management of post  hip fracture ?Patient currently not stable ? ?Dispo: The patient is from: Home ?           Anticipated disposition: Skilled nursing facility in 1 days once cleared by ortho ? ?Mobility Assessment (last 72  hours)   ? ? Mobility Assessment   ? ? [place] Name 08/11/21 0800 08/09/21 2343  ?  ?  ?  ? Does patient have an order for bedrest or is patient medically unstable No - Continue  assessment No - Continue assessment     ? What is the highest level of mobility based on the progressive mobility assessment? Level 2 ([place]) - Balance while sitting on edge of bed and cannot stand Level 1 (Bedfast) - Unable to balance while sitting on edge of bed     ? Is the above level different from baseline mobility prior to current illness? Yes - Recommend PT order Yes - Recommend PT order     ? ?  ?  ? ?  ?  ? ?Objective: ?Vitals last 24 hrs: ?Vitals:  ? 08/11/21 0100 08/11/21 0107 08/11/21 0524 08/11/21 0086  ?BP: 128/66 124/67 (!) 120/96   ?Pulse: 79 85 84   ?Resp: '10 15 16   '$ ?Temp:  98.9 ?F (37.2 ?C) 98.7 ?F (37.1 ?C)   ?TempSrc:  Oral Axillary   ?SpO2: 100% 96% 97% 95%  ?Weight:      ?Height:      ? ?Weight change:  ? ?Physical Examination: ?General exam: AA0,older than stated age, weak appearing. ?HEENT:Oral mucosa moist, Ear/Nose WNL grossly, dentition normal. ?Respiratory system: bilaterally clear,no use of accessory muscle ?Cardiovascular system: S1 & S2 +, No JVD,. ?Gastrointestinal system: Abdomen soft,NT,ND, BS+ ?Nervous System:Alert, awake, moving extremities and grossly nonfocal ?Extremities: edema neg,distal peripheral pulses palpable.  ?Skin: No rashes,no icterus. ?MSK: Normal muscle bulk,tone, power.  Right hip surgical site with dressing in place ? ? ?Medications reviewed:  ?Scheduled Meds: ? aspirin EC  325 mg Oral Q breakfast  ? docusate sodium  100 mg Oral BID  ? gabapentin  300 mg Oral TID  ? levothyroxine  75 mcg Oral Q0600  ? potassium chloride  20 mEq Oral Daily  ? senna  1 tablet Oral BID  ? ?Continuous Infusions: ? sodium chloride 100 mL/hr at 08/11/21 0148  ? acetaminophen    ?  ceFAZolin (ANCEF) IV 2 g (08/11/21 0340)  ? cefTRIAXone (ROCEPHIN)  IV 1 g (08/11/21 0215)  ? methocarbamol (ROBAXIN) IV    ? ? ?  ?Diet Order   ? ?       ?  Diet regular Room service appropriate? Yes; Fluid consistency: Thin  Diet effective now       ?  ? ?  ?  ? ?  ? ?Unresulted Labs (From admission,  onward)  ? ?  Start     Ordered  ? 08/12/21 0500  Vitamin B12  (Anemia Panel (PNL))  Tomorrow morning,   R       ? 08/11/21 1043  ? 08/12/21 0500  Folate  (Anemia Panel (PNL))  Tomorrow morning,   R       ? 08/11/21 1043  ? 08/12/21 0500  Iron and TIBC  (Anemia Panel (PNL))  Tomorrow morning,   R       ? 08/11/21 1043  ? 08/12/21 0500  Ferritin  (Anemia Panel (PNL))  Tomorrow morning,   R       ? 08/11/21 1043  ? 08/12/21 0500  Reticulocytes  (Anemia Panel (PNL))  Tomorrow morning,   R       ? 08/11/21 1043  ? 08/11/21 1500  Potassium  Once-Timed,   TIMED       ? 08/11/21 0929  ? 08/11/21  0500  CBC  Daily,   R     ? 08/11/21 0121  ? 08/11/21 8110  Basic metabolic panel  Daily,   R     ? 08/11/21 0121  ? ?  ?  ? ?  ?ata Reviewed: I have personally reviewed following labs and imaging studies ?CBC: ?Recent Labs  ?Lab 2021-08-17 ?1906 08/10/21 ?0450 08/11/21 ?0439  ?WBC 15.7* 12.9* 8.4  ?NEUTROABS 12.4* 10.2*  --   ?HGB 12.6 10.9* 9.2*  ?HCT 38.5 34.4* 29.2*  ?MCV 86.3 87.8 88.0  ?PLT 307 271 222  ? ?Basic Metabolic Panel: ?Recent Labs  ?Lab 17-Aug-2021 ?1906 17-Aug-2021 ?2300 08/10/21 ?0450 08/11/21 ?0439  ?NA 142  --  142 141  ?K 3.2*  --  3.3* 2.9*  ?CL 103  --  105 105  ?CO2 24  --  24 28  ?GLUCOSE 126*  --  115* 168*  ?BUN 59*  --  50* 32*  ?CREATININE 0.81  --  0.72 0.73  ?CALCIUM 8.7*  --  8.3* 7.9*  ?MG  --  2.5*  --   --   ?PHOS  --  4.1  --   --   ? ?GFR: ?Estimated Creatinine Clearance: 43.8 mL/min (by C-G formula based on SCr of 0.73 mg/dL). ?Liver Function Tests: ?Recent Labs  ?Lab August 17, 2021 ?1906 2021/08/17 ?2300 08/10/21 ?0024  ?AST 103* 88*  --   ?ALT 69* 62*  --   ?ALKPHOS 83 77  --   ?BILITOT 1.2 0.8  --   ?PROT 7.3 6.9  --   ?ALBUMIN 3.7 3.4* 3.7  ? ?No results for input(s): LIPASE, AMYLASE in the last 168 hours. ?No results for input(s): AMMONIA in the last 168 hours. ?Coagulation Profile: ?No results for input(s): INR, PROTIME in the last 168 hours. ?BNP (last 3 results) ?No results for input(s): PROBNP in the  last 8760 hours. ?HbA1C: ?No results for input(s): HGBA1C in the last 72 hours. ?CBG: ?No results for input(s): GLUCAP in the last 168 hours. ?Lipid Profile: ?No results for input(s): CHOL, HDL, LDLCALC,

## 2021-08-11 NOTE — TOC Initial Note (Signed)
Transition of Care (TOC) - Initial/Assessment Note  ? ? ?Patient Details  ?Name: Kerri [name] ?MRN: [id number] ?Date of Birth: 1929/09/20 ? ?Transition of Care (TOC) CM/SW Contact:    ?Trish Mage, LCSW ?Phone Number: ?08/11/2021, 2:44 PM ? ?Clinical Narrative:   Patient seen in follow up to PT recommendation of SNF.  Kerri [name] lives alone here in [place], daughter is at bedside. Kerri [name]' husband resides at [facility] care.  Kerri [name] emphatically states she does not  want to go to rehab, but daughter states she is unable to care for her in current condition, and I point out she is currently a 2 person max assist.  She reluctantly agreed to allow me to send out her information, while emphatically exclaiming that she needs to go to the "best" place, nothing less.  Bed search process explained and initiated. TOC will continue to follow during the course of hospitalization. ?        ? ? ?Expected Discharge Plan: [place] ?Barriers to Discharge: SNF Pending bed offer ? ? ?Patient Goals and CMS Choice ?Patient states their goals for this hospitalization and ongoing recovery are:: I don't want to go to rehab ?CMS Medicare.gov Compare Post Acute Care list provided to:: Patient ?Choice offered to / list presented to : Patient, Adult Children ? ?Expected Discharge Plan and Services ?Expected Discharge Plan: [place] ?  ?Discharge Planning Services: CM Consult ?Post Acute Care Choice: [place] ?Living arrangements for the past 2 months: Apartment ?                ?  ?  ?  ?  ?  ?  ?  ?  ?  ?  ? ?Prior Living Arrangements/Services ?Living arrangements for the past 2 months: Apartment ?Lives with:: Self ?Patient language and need for interpreter reviewed:: Yes ?       ?Need for Family Participation in Patient Care: Yes (Comment) ?Care giver support system in place?: Yes (comment) ?  ?Criminal Activity/Legal Involvement Pertinent to Current Situation/Hospitalization: No - Comment  as needed ? ?Activities of Daily Living ?Home Assistive Devices/Equipment: Dentures (specify type), Cane (specify quad or straight), Walker (specify type), Raised toilet seat with rails (full upper denture) ?ADL Screening (condition at time of admission) ?Patient's cognitive ability adequate to safely complete daily activities?: Yes ?Is the patient deaf or have difficulty hearing?: No ?Does the patient have difficulty seeing, even when wearing glasses/contacts?: No ?Does the patient have difficulty concentrating, remembering, or making decisions?: No ?Patient able to express need for assistance with ADLs?: Yes ?Does the patient have difficulty dressing or bathing?: Yes ?Independently performs ADLs?: No ?Communication: Independent ?Dressing (OT): Needs assistance ?Is this a change from baseline?: Change from baseline, expected to last >3 days ?Grooming: Independent ?Feeding: Independent ?Bathing: Needs assistance ?Is this a change from baseline?: Change from baseline, expected to last >3 days ?Toileting: Needs assistance ?Is this a change from baseline?: Change from baseline, expected to last >3days ?In/Out Bed: Needs assistance ?Is this a change from baseline?: Change from baseline, expected to last >3 days ?Walks in Home: Dependent ?Is this a change from baseline?: Change from baseline, expected to last >3 days ?Does the patient have difficulty walking or climbing stairs?: Yes ?Weakness of Legs: Right ?Weakness of Arms/Hands: None ? ?Permission Sought/Granted ?Permission sought to share information with : Family Supports ?Permission granted to share information with : Yes, Verbal Permission Granted ? Share Information with NAME: Kerri [name] (Daughter)  [phone] ?   ?   ?   ? ?Emotional Assessment ?Appearance:: Appears stated age ?Attitude/Demeanor/Rapport: Engaged ?Affect (typically observed): Frustrated, Agitated ?Orientation: : Oriented to Self, Oriented to  Time, Oriented to Situation ?Alcohol / Substance  Use: Not Applicable ?Psych Involvement: No (comment) ? ?Admission diagnosis:  Hip fracture ([place]) [S72.009A] ?Traumatic rhabdomyolysis, initial encounter ([place]) [T79.6XXA] ?Closed 2-part intertrochanteric fracture of right femur, initial encounter ([place]) [S72.141A] ?Patient Active Problem List  ? Diagnosis Date Noted  ? Normocytic anemia 08/11/2021  ? NSVT (nonsustained ventricular tachycardia) 08/10/2021  ? Elevated troponin 08/10/2021  ? UTI (urinary tract infection) 08/10/2021  ? Hip fracture ([place]) 08/09/2021  ? Elevated CK 08/09/2021  ? Dehydration 08/09/2021  ? SOB (shortness of breath) 04/07/2020  ? Primary osteoarthritis of right knee 10/06/2019  ? Palpitations 03/29/2019  ? Failed total hip arthroplasty ([place]) 11/27/2018  ? Melanoma ([place])   ? Dizziness 01/30/2017  ? Lumbar stenosis with neurogenic claudication 11/13/2013  ? Sebaceous cyst 11/20/2012  ? Squamous cell carcinoma in situ of skin of calf 04/03/2012  ?   Hypokalemia 09/08/2011  ? Postop Acute blood loss anemia 09/05/2011  ? Osteoarthritis of hip 09/04/2011  ? Melanoma of lower leg ([place]) 05/03/2011  ? PVC's (premature ventricular contractions) 07/30/2009  ? Hypothyroidism 05/10/2009  ? Hyperlipidemia 05/10/2009  ? Essential hypertension 05/10/2009  ? Coronary atherosclerosis 05/10/2009  ? OA (osteoarthritis) of knee 05/10/2009  ? SPINAL STENOSIS 05/10/2009  ? Carotid bruit 05/10/2009  ? ?PCP:  Lavone Orn, MD ?Pharmacy:   ?[facility] [place] ?[place] ?[place] [address] ?Phone: [phone] Fax: [phone] ? ? ? ? ?Social Determinants of Health (SDOH) Interventions ?  ? ?Readmission Risk Interventions ?   ? View : No data to display.  ?  ?  ?  ? ? ? ?

## 2021-08-12 ENCOUNTER — Encounter (HOSPITAL_COMMUNITY): Payer: Self-pay | Admitting: Orthopedic Surgery

## 2021-08-12 DIAGNOSIS — S72001A Fracture of unspecified part of neck of right femur, initial encounter for closed fracture: Secondary | ICD-10-CM | POA: Diagnosis not present

## 2021-08-12 DIAGNOSIS — E43 Unspecified severe protein-calorie malnutrition: Secondary | ICD-10-CM | POA: Insufficient documentation

## 2021-08-12 DIAGNOSIS — R4689 Other symptoms and signs involving appearance and behavior: Secondary | ICD-10-CM

## 2021-08-12 LAB — BASIC METABOLIC PANEL
Anion gap: 3 — ABNORMAL LOW (ref 5–15)
BUN: 20 mg/dL (ref 8–23)
CO2: 29 mmol/L (ref 22–32)
Calcium: 7.9 mg/dL — ABNORMAL LOW (ref 8.9–10.3)
Chloride: 106 mmol/L (ref 98–111)
Creatinine, Ser: 0.51 mg/dL (ref 0.44–1.00)
GFR, Estimated: >60 mL/min (ref >60)
Glucose, Bld: 103 mg/dL — ABNORMAL HIGH (ref 70–99)
Potassium: 3.7 mmol/L (ref 3.5–5.1)
Sodium: 138 mmol/L (ref 135–145)

## 2021-08-12 LAB — FERRITIN: Ferritin: 64 ng/mL (ref 11–307)

## 2021-08-12 LAB — VITAMIN B12: Vitamin B-12: 1348 pg/mL — ABNORMAL HIGH (ref 180–914)

## 2021-08-12 LAB — CBC
HCT: 26.4 % — ABNORMAL LOW (ref 36.0–46.0)
Hemoglobin: 8.3 g/dL — ABNORMAL LOW (ref 12.0–15.0)
MCH: 28.2 pg (ref 26.0–34.0)
MCHC: 31.4 g/dL (ref 30.0–36.0)
MCV: 89.8 fL (ref 80.0–100.0)
Platelets: 217 10*3/uL (ref 150–400)
RBC: 2.94 MIL/uL — ABNORMAL LOW (ref 3.87–5.11)
RDW: 14.4 % (ref 11.5–15.5)
WBC: 8 10*3/uL (ref 4.0–10.5)
nRBC: 0 % (ref 0.0–0.2)

## 2021-08-12 LAB — IRON AND TIBC
Iron: 30 ug/dL (ref 28–170)
Saturation Ratios: 12 % (ref 10.4–31.8)
TIBC: 244 ug/dL — ABNORMAL LOW (ref 250–450)
UIBC: 214 ug/dL

## 2021-08-12 LAB — RETICULOCYTES
Immature Retic Fract: 27.8 % — ABNORMAL HIGH (ref 2.3–15.9)
RBC.: 2.93 MIL/uL — ABNORMAL LOW (ref 3.87–5.11)
Retic Count, Absolute: 70 10*3/uL (ref 19.0–186.0)
Retic Ct Pct: 2.4 % (ref 0.4–3.1)

## 2021-08-12 LAB — RESP PANEL BY RT-PCR (FLU A&B, COVID) ARPGX2
Influenza A by PCR: NEGATIVE
Influenza B by PCR: NEGATIVE
SARS Coronavirus 2 by RT PCR: NEGATIVE

## 2021-08-12 LAB — FOLATE: Folate: 4.9 ng/mL — ABNORMAL LOW (ref >5.9)

## 2021-08-12 LAB — CK: Total CK: 157 U/L (ref 38–234)

## 2021-08-12 MED ORDER — FERROUS SULFATE 325 (65 FE) MG PO TABS
325.0000 mg | ORAL_TABLET | Freq: Every day | ORAL | Status: DC
  Administered 2021-08-13 – 2021-08-21 (×9): 325 mg via ORAL
  Filled 2021-08-12 (×10): qty 1

## 2021-08-12 MED ORDER — HYDROMORPHONE HCL 2 MG PO TABS
1.0000 mg | ORAL_TABLET | ORAL | 0 refills | Status: DC | PRN
Start: 2021-08-12 — End: 2021-08-21

## 2021-08-12 MED ORDER — ASPIRIN 325 MG PO TBEC: 325.0000 mg | DELAYED_RELEASE_TABLET | Freq: Every day | ORAL | 0 refills | Status: DC

## 2021-08-12 MED ORDER — FOLIC ACID 1 MG PO TABS
1.0000 mg | ORAL_TABLET | Freq: Every day | ORAL | Status: DC
  Administered 2021-08-12 – 2021-08-21 (×9): 1 mg via ORAL
  Filled 2021-08-12 (×10): qty 1

## 2021-08-12 MED ORDER — FOLIC ACID 5 MG/ML IJ SOLN
1.0000 mg | Freq: Every day | INTRAMUSCULAR | Status: DC
  Filled 2021-08-12: qty 0.2

## 2021-08-12 NOTE — Progress Notes (Signed)
Physical Therapy Treatment ?Patient Details ?Name: Kerri [name] ?MRN: [id number] ?DOB: Nov 11, 1929 ?Today's Date: 08/12/2021 ? ? ?History of Present Illness Patient is an 88 year old female admitted after fall s/p IM nail. PMH: hypertension history of syncope cluster headaches, asthma, CAD, chronic back pain, depression, HLD, GERD, hypothyroidism, irritable bowel syndrome, history of melanoma ? ?  ?PT Comments  ? ? Patient making slow but good progress. Pt continues to require Max+2 to move supine to sit EOB. She completed 5x sit<>stand and small side step/slide between each stand. Pt was able to rise to [employer] paddles and sit for transfer OOB today. Encouraged to sit up for at least 1 hour and call when ready to return to bed. Acute PT will continue to progress pt as able. Continue to recommend SNF for rehab. ? ?  ?Recommendations for follow up therapy are one component of a multi-disciplinary discharge planning process, led by the attending physician.  Recommendations may be updated based on patient status, additional functional criteria and insurance authorization. ? ?Follow Up Recommendations ? Skilled nursing-short term rehab (<3 hours/day) ?  ?  ?Assistance Recommended at Discharge Frequent or constant Supervision/Assistance  ?Patient can return home with the following Two people to help with walking and/or transfers;Two people to help with bathing/dressing/bathroom;Assistance with cooking/housework;Assistance with feeding;Direct supervision/assist for medications management;Assist for transportation;Help with stairs or ramp for entrance ?  ?Equipment Recommendations ? None recommended by PT  ?  ?Recommendations for Other Services   ? ? ?  ?Precautions / Restrictions Precautions ?Precautions: Fall ?Restrictions ?Weight Bearing Restrictions: Yes ?RLE Weight Bearing: Weight bearing as tolerated  ?  ? ?Mobility ? Bed Mobility ?Overal bed mobility: Needs Assistance ?Bed Mobility: Supine to Sit, Sit to Supine ?  ?   ?Supine to sit: +2 for physical assistance, +2 for safety/equipment, Max assist ?  ?  ?General bed mobility comments: pt attempted minimally to initaite LE movement to EOB but did attempt to sit trunk upright slightly. Bed pad needed to pivot hips. Max+2 assist to fully sit up to edge. ?  ? ?Transfers ?Overall transfer level: Needs assistance ?Equipment used: 2 person hand held assist ?Transfers: Sit to/from Stand, Bed to chair/wheelchair/BSC ?Sit to Stand: Mod assist, +2 physical assistance, +2 safety/equipment ?  ?  ?  ?  ?  ?General transfer comment: pt completed sit<>stand with 2+ mod assist for power up from EOB with HHA. pt completed 5x stands and slight side scoot towards HOB. therapist provided max asssit to move Lt LE latearlly and provided weight shift onto Lt leg for pt to initiate moving Rt foot towards Lt. Pt abel to stand with Stedy and transfer to recliner. lift sling underneath pt in case she fatigued. ?Transfer via Lift Equipment: Stedy ? ?Ambulation/Gait ?  ?  ?  ?  ?  ?  ?  ?  ? ? ?Stairs ?  ?  ?  ?  ?  ? ? ?Wheelchair Mobility ?  ? ?Modified Rankin (Stroke Patients Only) ?  ? ? ?  ?Balance Overall balance assessment: History of Falls, Needs assistance ?Sitting-balance support: Feet supported, Single extremity supported, No upper extremity supported ?Sitting balance-Leahy Scale: Poor ?Sitting balance - Comments: needing min to mod A ?Postural control: Posterior lean ?Standing balance support: Bilateral upper extremity supported, During functional activity ?Standing balance-Leahy Scale: Zero ?Standing balance comment: max +2 ?  ?  ?  ?  ?  ?  ?  ?  ?  ?  ?  ?  ? ?  ?  Cognition Arousal/Alertness: Awake/alert ?Behavior During Therapy: Anxious ?Overall Cognitive Status: Within Functional Limits for tasks assessed ?  ?  ?  ?  ?  ?  ?  ?  ?  ?  ?  ?  ?  ?  ?  ?  ?General Comments: Patient is alert and oriented however particular/demanding. more pleasant and re-directable today. ?  ?  ? ?  ?Exercises    ? ?  ?General Comments   ?  ?  ? ?Pertinent Vitals/Pain Pain Assessment ?Pain Assessment: Faces ?Faces Pain Scale: Hurts even more ?Pain Location: R hip with mobility ?Pain Descriptors / Indicators: Grimacing, Guarding, Discomfort ?Pain Intervention(s): Limited activity within patient's tolerance, Monitored during session, Repositioned  ? ? ?Home Living   ?  ?  ?  ?  ?  ?  ?  ?  ?  ?   ?  ?Prior Function    ?  ?  ?   ? ?PT Goals (current goals can now be found in the care plan section) Acute Rehab PT Goals ?Patient Stated Goal: pt didn't state specifically. daughter wants rehab. ?PT Goal Formulation: With patient ?Time For Goal Achievement: 08/25/21 ?Potential to Achieve Goals: Fair ?Progress towards PT goals: Progressing toward goals ? ?  ?Frequency ? ? ? 7X/week ? ? ? ?  ?PT Plan Current plan remains appropriate  ? ? ?Co-evaluation   ?  ?  ?  ?  ? ?  ?AM-PAC PT "6 Clicks" Mobility   ?Outcome Measure ? Help needed turning from your back to your side while in a flat bed without using bedrails?: Total ?Help needed moving from lying on your back to sitting on the side of a flat bed without using bedrails?: Total ?Help needed moving to and from a bed to a chair (including a wheelchair)?: Total ?Help needed standing up from a chair using your arms (e.g., wheelchair or bedside chair)?: Total ?Help needed to walk in hospital room?: Total ?Help needed climbing 3-5 steps with a railing? : Total ?6 Click Score: 6 ? ?  ?End of Session Equipment Utilized During Treatment: Gait belt ?Activity Tolerance: Patient tolerated treatment well ?Patient left: in chair;with call bell/phone within reach;with chair alarm set;with family/visitor present ?Nurse Communication: Mobility status ?PT Visit Diagnosis: Muscle weakness (generalized) (M62.81);Difficulty in walking, not elsewhere classified (R26.2) ?  ? ? ?Time: 4034-7425 ?PT Time Calculation (min) (ACUTE ONLY): 52 min ? ?Charges:  $Therapeutic Exercise: 8-22 mins ?$Therapeutic  Activity: 23-37 mins          ?          ? ?Gwynneth Albright PT, DPT ?Acute Rehabilitation Services ?Office [phone] ?Pager [phone]  ? ? ?Jacques Navy ?08/12/2021, 4:26 PM ? ?

## 2021-08-12 NOTE — TOC Progression Note (Addendum)
Transition of Care (TOC) - Progression Note  ? ? ?Patient Details  ?Name: Kerri [name] ?MRN: [id number] ?Date of Birth: April 19, 1930 ? ?Transition of Care (TOC) CM/SW Contact  ?[place], LCSW ?Phone Number: ?08/12/2021, 1:51 PM ? ?Clinical Narrative:    ? ?CSW called pt daughter to discuss bed offers and see if family/patient had a specific facility they desired. Daughter states that pt wants the best facility and indicates that [facility] is one that pt would want. They did not offer and do not accept patients from outside of their living community. CSW provided SNF offers and star ratings verbally. Daughter provides choices of [facility] and [facility] though unsure if pt will be agreeable. Daughter states that pt does not like [facility] as she knew a family member or friend who had a negative experience there.  ? ?CSW met with the pt briefly. Introduced self and attempted to discuss SNF offers. Pt explaining she needs to be moved from the chair to the bed before she can talk. States she is in too much pain. CSW notified RN. CSW will try again later.  ? ?1400: CSW met with pt again. Pt stating she wants "the best" SNF. CSW provided SNF offers. Pt quickly states she wants [facility]. CSW explains insurance auth process and anticipated DC tomorrow. CSW confirmed [facility] can accept tomorrow. CSW will start insurance auth with [facility]. Covid test needed; CSW notified MD and RN. CSW called and updated daughter with plan.  ? ?Expected Discharge Plan: [place] ?Barriers to Discharge: SNF Pending bed offer ? ?Expected Discharge Plan and Services ?Expected Discharge Plan: [place] ?  ?Discharge Planning Services: CM Consult ?Post Acute Care Choice: [place] ?Living arrangements for the past 2 months: Apartment ?                ?  ?  ?  ?  ?  ?  ?  ?  ?  ?  ? ? ?Social Determinants of Health (SDOH) Interventions ?  ? ?Readmission Risk Interventions ?   ? View : No data to  display.  ?  ?  ?  ? ? ?

## 2021-08-12 NOTE — Progress Notes (Signed)
Patient said to this nurse, "You're a shithead." Patient was asked to not cuss at staff. Charge RN made aware.  ?

## 2021-08-12 NOTE — Progress Notes (Signed)
?PROGRESS NOTE ?Kerri [name]  DPO:[id number] DOB: [birth year] DOA: 08/09/2021 ?PCP: Lavone Orn, MD  ? ?Brief Narrative/Hospital Course: ?88 y.o. female with medical history significant of hypertension history of syncope cluster headaches, asthma, CAD, chronic back pain, depression, HLD, GERD, hypothyroidism, irritable bowel syndrome, history of melanoma presented to the ED after a fall that was 5 days PTA a and could not get up.  Per report while going to the shower tripped on her shoes and fell down and hurt her hip back, did not have a fall to call anybody. She is admitted for  Closed 2-part intertrochanteric fracture of right femur, traumatic rhabdomyolysis. Had abnormal ecg and pt could not provide hx,and had small run of NSVT, patient was admitted for orthopedic evaluation, cardiology was consulted for preoperative evaluation.  In the ED CT head no acute finding, but with severe atherosclerotic plaque of the carotid arteries within the neck, chest x-ray no acute finding CT abdomen pelvis no acute finding, CTA chest no evidence of infiltrate.  ?  ?Subjective: ?Seen and examined this morning, less agitated but still at times verbally aggressive. ?Complains of pain at the fracture site ?Overnight afebrile, BP stable ?Labs with stable electrolytes, hemoglobin 8.3 g ?She apparently has lactose intolerance and had loose stool 3/23 after eating dairy product on 3/22 ? ?Assessment and Plan: ?Principal Problem: ?  Hip fracture ([place]) ?Active Problems: ?    Hypokalemia ?  Elevated CK ?  Elevated troponin ?  UTI (urinary tract infection) ?  Hypothyroidism ?  Hyperlipidemia ?  Essential hypertension ?  Coronary atherosclerosis ?  Carotid bruit ?  Dehydration ?  NSVT (nonsustained ventricular tachycardia) ?  Normocytic anemia ?  Protein-calorie malnutrition, severe ?  ?Closed 2 part intertrochanteric fracture of the right femur status post mechanical fall: Seen by cardiology and preop underwent echocardiogram and carotid  duplex. S/P IM fixation Rt femur by Dr. Lyla Glassing 08/10/21-continue postop pain control DVT prophylaxis PT OT as per orthopedics.  Placed on aspirin 325 daily, Robaxin, Dilaudid p.o./IV, stool softener and MiraLAX as needed by orthopedics team. ? ?Traumatic rhabdomyolysis: CK on admission 1231, downtrending 1050-recheck ck.Off ivf ?Dehydration: Encourage p.o.  Given IVF. ?Hypokalemia:resolved ? ?Normocytic anemia ?Folate deficiency anemia ?Acute blood loss anemia ?hb in January was 12.2 g but before that in May/25/21- 8.9 g.  Now downtrending at 8.3-suspect acute blood loss due to hip fracture and hemodilution from IV fluids.  Monitor H&H transfuse if less than 7 g.  Anemia panel suggestive of low folic acid with a stable B12 and iron . supplement folate , iron ?Recent Labs  ?Lab 08/09/21 ?1906 08/10/21 ?0450 08/11/21 ?4315 08/12/21 ?4008  ?HGB 12.6 10.9* 9.2* 8.3*  ?HCT 38.5 34.4* 29.2* 26.4*  ?  ?Abnormal EKG-with ST depression diffuse ?NSVT  ?CAD: ?Mildly elevated troponin 141-1 52: Likely demand ischemia. ?Cardiology consulted appreciate input denies chest pain.  Echo pending ? ?Hyperlipidemia on Crestor-resume once CK improves  ?Hypertension on chlorthalidone at home.BP is well controlled diuretics on hold ?Atherosclerotic plaque in the carotid arteries in the CT scan incidentally noted, carotid duplex no acute finding. ? ?Hypothyroidism on Synthroid.Euthyroid with TSH 1.4 ? ?Leukocytosis/UTI:UA with WBC 21-50, urine culture w/ GNR >100,0000.Cont ceftriaxone  ? ?DVT prophylaxis: SCDs Start: 08/11/21 0121 ASA 325 mg per ortho ?Code Status:   Code Status: Full Code ?Family Communication: plan of care discussed with patient/daughter at bedside 3/22. ?Patient status is: Inpatient level of care: Telemetry  ?Remains inpatient because: Ongoing management of post  hip fracture ?Patient currently  not stable ? ?Dispo: The patient is from:Home ?           Anticipated disposition: SNFonce cleared by ortho/and urine culture  back ? ?Mobility Assessment (last 72 hours)   ? ? Mobility Assessment   ? ? [place] Name 08/11/21 2217 08/11/21 1500 08/11/21 1400 08/11/21 0800 08/09/21 2343  ? Does patient have an order for bedrest or is patient medically unstable No - Continue assessment -- -- No - Continue assessment No - Continue assessment  ? What is the highest level of mobility based on the progressive mobility assessment? Level 2 ([place]) - Balance while sitting on edge of bed and cannot stand Level 2 (Chairfast) - Balance while sitting on edge of bed and cannot stand Level 2 (Chairfast) - Balance while sitting on edge of bed and cannot stand Level 2 (Chairfast) - Balance while sitting on edge of bed and cannot stand Level 1 (Bedfast) - Unable to balance while sitting on edge of bed  ? Is the above level different from baseline mobility prior to current illness? Yes - Recommend PT order -- -- Yes - Recommend PT order Yes - Recommend PT order  ? ?  ?  ? ?  ?  ? ?Objective: ?Vitals last 24 hrs: ?Vitals:  ? 08/11/21 0800 08/11/21 1537 08/11/21 2022 08/12/21 0548  ?BP:  108/61 113/65 123/64  ?Pulse:  67 97 62  ?Resp:  '15 14 14  '$ ?Temp:  98.6 ?F (37 ?C) 98.2 ?F (36.8 ?C) 98 ?F (36.7 ?C)  ?TempSrc:  Oral Oral Oral  ?SpO2: 98% 100% 97% 99%  ?Weight:      ?Height:      ? ?Weight change:  ? ?Physical Examination: ?General exam: AA0x3,older than stated age, weak appearing. ?HEENT:Oral mucosa moist, Ear/Nose WNL grossly, dentition normal. ?Respiratory system: bilaterally diminished,no use of accessory muscle ?Cardiovascular system: S1 & S2 +, No JVD,. ?Gastrointestinal system: Abdomen soft,NT,ND, BS+ ?Nervous System:Alert, awake, moving extremities and grossly nonfocal ?Extremities: edema neg,distal peripheral pulses palpable.  ?Skin: No rashes,no icterus. ?MSK: Normal muscle bulk,tone, power ?Right femur surgical site with dressing in place ? ?Medications reviewed:  ?Scheduled Meds: ? aspirin EC  325 mg Oral Q breakfast  ? calcium carbonate  1,250 mg  Oral Daily  ? cholecalciferol  2,000 Units Oral Daily  ? docusate sodium  100 mg Oral BID  ? feeding supplement  1 Container Oral TID BM  ? [START ON 08/13/2021] ferrous sulfate  325 mg Oral Q breakfast  ? folic acid  1 mg Oral Daily  ? gabapentin  300 mg Oral TID  ? levothyroxine  75 mcg Oral Q0600  ? multivitamin with minerals  1 tablet Oral Daily  ? nutrition supplement (JUVEN)  1 packet Oral BID BM  ? potassium chloride  20 mEq Oral Daily  ? rosuvastatin  10 mg Oral QHS  ? senna  1 tablet Oral BID  ? vitamin B-12  500 mcg Oral Daily  ? ?Continuous Infusions: ? cefTRIAXone (ROCEPHIN)  IV 1 g (08/12/21 0339)  ? methocarbamol (ROBAXIN) IV    ? ? ?  ?Diet Order   ? ?       ?  Diet regular Room service appropriate? Yes; Fluid consistency: Thin  Diet effective now       ?  ? ?  ?  ? ?  ? ?Unresulted Labs (From admission, onward)  ? ?  Start     Ordered  ? 08/11/21 0500  CBC  Daily,  R     ? 08/11/21 0121  ? ?  ?  ? ?  ?ata Reviewed: I have personally reviewed following labs and imaging studies ?CBC: ?Recent Labs  ?Lab 2021-09-07 ?1906 08/10/21 ?0450 08/11/21 ?6578 08/12/21 ?4696  ?WBC 15.7* 12.9* 8.4 8.0  ?NEUTROABS 12.4* 10.2*  --   --   ?HGB 12.6 10.9* 9.2* 8.3*  ?HCT 38.5 34.4* 29.2* 26.4*  ?MCV 86.3 87.8 88.0 89.8  ?PLT 307 271 222 217  ? ?Basic Metabolic Panel: ?Recent Labs  ?Lab 09-07-2021 ?1906 09-07-2021 ?2300 08/10/21 ?0450 08/11/21 ?0439 08/11/21 ?1446 08/12/21 ?2952  ?NA 142  --  142 141  --  138  ?K 3.2*  --  3.3* 2.9* 4.1 3.7  ?CL 103  --  105 105  --  106  ?CO2 24  --  24 28  --  29  ?GLUCOSE 126*  --  115* 168*  --  103*  ?BUN 59*  --  50* 32*  --  20  ?CREATININE 0.81  --  0.72 0.73  --  0.51  ?CALCIUM 8.7*  --  8.3* 7.9*  --  7.9*  ?MG  --  2.5*  --   --   --   --   ?PHOS  --  4.1  --   --   --   --   ? ?GFR: ?Estimated Creatinine Clearance: 43.8 mL/min (by C-G formula based on SCr of 0.51 mg/dL). ?Liver Function Tests: ?Recent Labs  ?Lab September 07, 2021 ?1906 Sep 07, 2021 ?2300 08/10/21 ?0024  ?AST 103* 88*  --    ?ALT 69* 62*  --   ?ALKPHOS 83 77  --   ?BILITOT 1.2 0.8  --   ?PROT 7.3 6.9  --   ?ALBUMIN 3.7 3.4* 3.7  ? ?No results for input(s): LIPASE, AMYLASE in the last 168 hours. ?No results for input(s): AMMONIA in

## 2021-08-12 NOTE — Progress Notes (Signed)
? ? ?  Subjective: ? ?Patient reports pain as moderate.  Denies N/V/CP/SOB. She complains of weakness in the right extremity.  ? ?Objective:  ? ?VITALS:   ?Vitals:  ? 08/11/21 0800 08/11/21 1537 08/11/21 2022 08/12/21 0548  ?BP:  108/61 113/65 123/64  ?Pulse:  67 97 62  ?Resp:  '15 14 14  '$ ?Temp:  98.6 ?F (37 ?C) 98.2 ?F (36.8 ?C) 98 ?F (36.7 ?C)  ?TempSrc:  Oral Oral Oral  ?SpO2: 98% 100% 97% 99%  ?Weight:      ?Height:      ? ? ?NAD ?ABD soft ?Neurovascular intact ?Sensation intact distally ?Intact pulses distally ?Dorsiflexion/Plantar flexion intact ?Incision: dressing C/D/I ?Compartment soft and non tender. ? ? ?Lab Results  ?Component Value Date  ? WBC 8.0 08/12/2021  ? HGB 8.3 (L) 08/12/2021  ? HCT 26.4 (L) 08/12/2021  ? MCV 89.8 08/12/2021  ? PLT 217 08/12/2021  ? ?BMET ?   ?Component Value Date/Time  ? NA 138 08/12/2021 0427  ? NA 138 02/11/2019 1413  ? K 3.7 08/12/2021 0427  ? CL 106 08/12/2021 0427  ? CO2 29 08/12/2021 0427  ? GLUCOSE 103 (H) 08/12/2021 0427  ? BUN 20 08/12/2021 0427  ? BUN 14 02/11/2019 1413  ? CREATININE 0.51 08/12/2021 0427  ? CALCIUM 7.9 (L) 08/12/2021 0427  ? GFRNONAA >60 08/12/2021 0427  ? ? ? ?Assessment/Plan: ?2 Days Post-Op  ? ?Principal Problem: ?  Hip fracture ([place]) ?Active Problems: ?  Hypothyroidism ?  Hyperlipidemia ?  Essential hypertension ?  Coronary atherosclerosis ?  Carotid bruit ?    Hypokalemia ?  Elevated CK ?  Dehydration ?  NSVT (nonsustained ventricular tachycardia) ?  Elevated troponin ?  UTI (urinary tract infection) ?  Normocytic anemia ?  Protein-calorie malnutrition, severe ? ?Patient is alert and oriented. No acute distress. Daughter at bedside. ?WBAT with walker ?DVT ppx: Aspirin, SCDs, TEDS ?PO pain control: Hydromorphone PRN pain.  ?PT/OT: Per PT patient limited by weakness, pain, and fear of mobilizing, recommending SNF.  ?Dispo: Likely discharge to SNF for rehab once medically stable. Hospitalist following. ?Will follow-up with Dr. Lyla Glassing in 2 weeks for  wound check.  ? ?Charlott Rakes ?08/12/2021, 10:04 AM ? ? ?Rod Can, MD ?[phone] ?[facility] [employer]  [facility] ?[address], [place], [place] [address] ?Phone: [phone] ?[email or web address] ?Facebook  Engineer, structural  ?  ?  ?

## 2021-08-12 NOTE — Progress Notes (Signed)
Patient verbally aggressive, abusive and condescending with all staff. 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 Patient said, "Why are you wearing two masks," to nurse tech. When nurse tech left the room patient stated," Some people are so stupid, I guess no one watches [employer]," nurse present when patient said this.  ?

## 2021-08-12 NOTE — Consult Note (Signed)
?HPI: 88 y.o. female with medical history significant of hypertension history of syncope cluster headaches, asthma, CAD, chronic back pain, depression, HLD, GERD, hypothyroidism, irritable bowel syndrome, history of melanoma presented to the ED after a fall that was 5 days PTA a and could not get up.  Per report while going to the shower tripped on her shoes and fell down and hurt her hip back, did not have a fall to call anybody. She is admitted for  Closed 2-part intertrochanteric fracture of right femur, traumatic rhabdomyolysis. Had abnormal ecg and pt could not provide hx,and had small run of NSVT, patient was admitted for orthopedic evaluation, cardiology was consulted for preoperative evaluation.   ? ?Psych consult placed for refusal to take depression meds and aggressive. Patient is seen and assessed briefly. She did not wish to participate in psychiatric evaluation. Patient did agree to answer a few questions "it will never be a good time for you to come back and talk to me. Why are you even here? Please dont turn my TV off. I was watching [employer], that is all I will watch."  Patient identifies herself as a "strong willed independent woman. I will never take medication to make myself feel better. I got me for that. The day I take medication to make me feel better you will need to send me back to [place]." She declines any further interest of psychotropic medications. When discussing concerns about aggression she becomes irritable and irate, then begins to curse about nursing staff and her demands. She denies any disruptive behaviors, agitation, aggression, or violent or verbal outburst towards nursing staff or family members.  ? ?She denies any acute symptoms of mania at this time to include impulsivity, grandiosity, mood lability, hypersexuality.  She does present with the above symptoms on this evaluation.  She is observed to be yelling inappropriate obscenities and demanding for staff and this provider  to complete certain task. She further denies any depressive symptoms to include anhedonia, hopelessness, worthlessness, guilty, suicidal.  She denies any acute psychosis, paranoia.  She does not appear to be displaying any or responding to internal stimuli, external stimuli, or exhibiting delusional thought disorder.  Patient denies any access to weapons, denies any alcohol and or substance abuse.  She reports moderate sleep and fair appetite.There is no evidence of delusional thought content and patient appears to answer all questions appropriately.  At this time patient appears to be psychiatrically stable to discharge home, with support system services in place.    ?  ?On evaluation patient is alert and oriented, irritable and uncooperative, very unpleasant upon approach.  She is noted to be sitting in her chair, after completing physical therapy. Her interaction with physical therapy team was very welcoming and engaging well, it should be noted that her family was at the bedside. Upon entering the room and introducing self, patient did not wish to talk or participate in psychiatric evaluation. Patient displayed irrational, demeaning and hatred thoughts, attitudes and behaviors towards nursing staff and this provider. Her words and attitudes are pathological and will likely have a negative impact on her psychosocial state and increase in behaviors during this stay. This may be interpreted as aggressive, although believe this is patient baseline. In the event she agrees to start medication low dose mood stabilizer will help target her impulsivity, irritability, agitation and aggression. Also will likely benefit from having family at the bedside, to reduce delirium and help patient feel comfortable while here.    ? ?-  Please see additional notes from nursing regarding patient remarks.  ?-Declines any acute psychiatric interventions at this time.  ?-High Risk for delirium - recommend initiating delirium precautions.   ?-Allow family members at bedside after hours to help patient be more comfortable during her stay.  ? ?Psychiatry to sign off at this time. Please re consult with any new concerns that warrant inpatient psychiatric consult. Otherwise patient can be seen outpatient setting for management of irritability and behaviors.  ? ? ?

## 2021-08-13 DIAGNOSIS — S72001A Fracture of unspecified part of neck of right femur, initial encounter for closed fracture: Secondary | ICD-10-CM | POA: Diagnosis not present

## 2021-08-13 LAB — URINE CULTURE: Culture: >=100000 — AB

## 2021-08-13 LAB — CBC
HCT: 25.3 % — ABNORMAL LOW (ref 36.0–46.0)
Hemoglobin: 8.1 g/dL — ABNORMAL LOW (ref 12.0–15.0)
MCH: 29.5 pg (ref 26.0–34.0)
MCHC: 32 g/dL (ref 30.0–36.0)
MCV: 92 fL (ref 80.0–100.0)
Platelets: 204 10*3/uL (ref 150–400)
RBC: 2.75 MIL/uL — ABNORMAL LOW (ref 3.87–5.11)
RDW: 14.6 % (ref 11.5–15.5)
WBC: 6.6 10*3/uL (ref 4.0–10.5)
nRBC: 0 % (ref 0.0–0.2)

## 2021-08-13 MED ORDER — SODIUM CHLORIDE 0.9 % IV SOLN
1.0000 g | Freq: Two times a day (BID) | INTRAVENOUS | Status: DC
  Administered 2021-08-13 – 2021-08-15 (×4): 1 g via INTRAVENOUS
  Filled 2021-08-13 (×4): qty 20

## 2021-08-13 NOTE — Plan of Care (Signed)
No acute events this shift. °Problem: Education: °Goal: Knowledge of General Education information will improve °Description: Including pain rating scale, medication(s)/side effects and non-pharmacologic comfort measures °Outcome: Progressing °  °Problem: Health Behavior/Discharge Planning: °Goal: Ability to manage health-related needs will improve °Outcome: Progressing °  °Problem: Clinical Measurements: °Goal: Ability to maintain clinical measurements within normal limits will improve °Outcome: Progressing °Goal: Will remain free from infection °Outcome: Progressing °Goal: Diagnostic test results will improve °Outcome: Progressing °Goal: Respiratory complications will improve °Outcome: Progressing °Goal: Cardiovascular complication will be avoided °Outcome: Progressing °  °Problem: Activity: °Goal: Risk for activity intolerance will decrease °Outcome: Progressing °  °Problem: Nutrition: °Goal: Adequate nutrition will be maintained °Outcome: Progressing °  °Problem: Coping: °Goal: Level of anxiety will decrease °Outcome: Progressing °  °Problem: Elimination: °Goal: Will not experience complications related to bowel motility °Outcome: Progressing °Goal: Will not experience complications related to urinary retention °Outcome: Progressing °  °Problem: Pain Managment: °Goal: General experience of comfort will improve °Outcome: Progressing °  °Problem: Safety: °Goal: Ability to remain free from injury will improve °Outcome: Progressing °  °Problem: Skin Integrity: °Goal: Risk for impaired skin integrity will decrease °Outcome: Progressing °  °

## 2021-08-13 NOTE — Plan of Care (Signed)
Pt aox3-4, forgetful, irritable, but cooperative with this RN.  ?S/P Right Hip repair, dressing intact. PT/OT following.  ?Medications per orders.  ?Plan for SNF on D/C.  ? ?Problem: Education: ?Goal: Knowledge of General Education information will improve ?Description: Including pain rating scale, medication(s)/side effects and non-pharmacologic comfort measures ?Outcome: Progressing ?  ?Problem: Health Behavior/Discharge Planning: ?Goal: Ability to manage health-related needs will improve ?Outcome: Progressing ?  ?Problem: Clinical Measurements: ?Goal: Ability to maintain clinical measurements within normal limits will improve ?Outcome: Progressing ?Goal: Will remain free from infection ?Outcome: Progressing ?Goal: Diagnostic test results will improve ?Outcome: Progressing ?Goal: Respiratory complications will improve ?Outcome: Progressing ?Goal: Cardiovascular complication will be avoided ?Outcome: Progressing ?  ?Problem: Activity: ?Goal: Risk for activity intolerance will decrease ?Outcome: Progressing ?  ?Problem: Nutrition: ?Goal: Adequate nutrition will be maintained ?Outcome: Progressing ?  ?Problem: Coping: ?Goal: Level of anxiety will decrease ?Outcome: Progressing ?  ?Problem: Elimination: ?Goal: Will not experience complications related to bowel motility ?Outcome: Progressing ?Goal: Will not experience complications related to urinary retention ?Outcome: Progressing ?  ?Problem: Pain Managment: ?Goal: General experience of comfort will improve ?Outcome: Progressing ?  ?Problem: Safety: ?Goal: Ability to remain free from injury will improve ?Outcome: Progressing ?  ?Problem: Skin Integrity: ?Goal: Risk for impaired skin integrity will decrease ?Outcome: Progressing ?  ?

## 2021-08-13 NOTE — TOC Progression Note (Addendum)
Transition of Care (TOC) - Progression Note  ? ? ?Patient Details  ?Name: Kerri [name] ?MRN: [id number] ?Date of Birth: July 21, 1929 ? ?Transition of Care (TOC) CM/SW Contact  ?Ross Ludwig, LCSW ?Phone Number: ?08/13/2021, 6:48 PM ? ?Clinical Narrative:    ? ?CSW spoke to [facility] to see if they can accept patient over the weekend.  Per admissions worker Claiborne Billings, they can take her tomorrow, however she will check to see if the Nucenta can be provided.  Insurance authorization has been received, reference [id number] next review 08/16/2021.  CSW to continue to follow patient's progress throughout discharge planning. ? ? ?Expected Discharge Plan: [place] ?Barriers to Discharge: SNF Pending bed offer ? ?Expected Discharge Plan and Services ?Expected Discharge Plan: [place] ?  ?Discharge Planning Services: CM Consult ?Post Acute Care Choice: [place] ?Living arrangements for the past 2 months: Apartment ?                ?  ?  ?  ?  ?  ?  ?  ?  ?  ?  ? ? ?Social Determinants of Health (SDOH) Interventions ?  ? ?Readmission Risk Interventions ?   ? View : No data to display.  ?  ?  ?  ? ? ?

## 2021-08-13 NOTE — Progress Notes (Signed)
Physical Therapy Treatment ?Patient Details ?Name: Kerri [name] ?MRN: [id number] ?DOB: 08-02-1929 ?Today's Date: 08/13/2021 ? ? ?History of Present Illness Patient is an 88 year old female admitted on 08/09/21 after fall with intertrochanteric fracture of right femur and traumatic rhabdomyolysis.  Pt is s/p IM nail on 08/10/21. PMH: hypertension history of syncope cluster headaches, asthma, CAD, chronic back pain, depression, HLD, GERD, hypothyroidism, irritable bowel syndrome, history of melanoma ? ?  ?PT Comments  ? ? Pt with limited progress due to pain and incontinence.  She required max of 2 for bed mobility and mod x 2 to stand in STEDY with cues for sequencing and encouragement to assist as able.  Required increased time and cues for all transfers/movements due to pain.  Pt also continually urinating and Purewick not catching well with mobility despite donning mesh brief to hold in place.  Pt required return to bed for further ADLs and unable to get to chair.  Recommend having pt premedicated for pain prior to session - IV dilaudid prior to session did little to control pain per pt.  ?  ?Recommendations for follow up therapy are one component of a multi-disciplinary discharge planning process, led by the attending physician.  Recommendations may be updated based on patient status, additional functional criteria and insurance authorization. ? ?Follow Up Recommendations ? Skilled nursing-short term rehab (<3 hours/day) ?  ?  ?Assistance Recommended at Discharge Frequent or constant Supervision/Assistance  ?Patient can return home with the following Two people to help with walking and/or transfers;Two people to help with bathing/dressing/bathroom;Assistance with cooking/housework;Assistance with feeding;Direct supervision/assist for medications management;Assist for transportation;Help with stairs or ramp for entrance ?  ?Equipment Recommendations ? Other (comment) (defer to next venue)  ?  ?Recommendations for Other  Services   ? ? ?  ?Precautions / Restrictions Precautions ?Precautions: Fall ?Restrictions ?RLE Weight Bearing: Weight bearing as tolerated  ?  ? ?Mobility ? Bed Mobility ?Overal bed mobility: Needs Assistance ?Bed Mobility: Supine to Sit, Sit to Supine, Rolling ?Rolling: Mod assist, +2 for physical assistance ?  ?Supine to sit: +2 for physical assistance, +2 for safety/equipment, Max assist ?Sit to supine: Max assist, +2 for physical assistance, +2 for safety/equipment ?  ?General bed mobility comments: Pt encouraged/cued to assist as able particularly with moving L LE for transfers and reaching with UE for rails with rolling and supine/sit.  Rolled both sides twice for ADLs. ?  ? ?Transfers ?Overall transfer level: Needs assistance ?  ?Transfers: Sit to/from Stand ?Sit to Stand: Mod assist, +2 physical assistance, +2 safety/equipment ?  ?  ?  ?  ?  ?General transfer comment: Pt completed sit to stand x 3 into STEDY with mod A of 2 using gait belt and bed pad under buttock. REquiring encouragment and cues to stand completely.  Also, required assist to position feet on STEDY before standing.  Pt requiring bed significantly elevated for sit to stand and despite ADLs prior to moving and leaving purewick - pt's pad/underwear again wet with urine in standing so had to return to supine and unable to go to chair. ?  ? ?Ambulation/Gait ?  ?  ?  ?  ?  ?  ?  ?General Gait Details: unable ? ? ?Stairs ?  ?  ?  ?  ?  ? ? ?Wheelchair Mobility ?  ? ?Modified Rankin (Stroke Patients Only) ?  ? ? ?  ?Balance Overall balance assessment: History of Falls, Needs assistance ?Sitting-balance support: Feet supported, Bilateral upper extremity  supported ?Sitting balance-Leahy Scale: Poor ?Sitting balance - Comments: Requiring UE support and min- mod A at times; EOB for at least 10 mins ?  ?Standing balance support: Bilateral upper extremity supported, During functional activity ?Standing balance-Leahy Scale: Zero ?Standing balance  comment: 3 stands into STEDY with assist ?  ?  ?  ?  ?  ?  ?  ?  ?  ?  ?  ?  ? ?  ?Cognition Arousal/Alertness: Awake/alert ?Behavior During Therapy: Anxious ?Overall Cognitive Status: Within Functional Limits for tasks assessed ?  ?  ?  ?  ?  ?  ?  ?  ?  ?  ?  ?  ?  ?  ?  ?  ?  ?  ?  ? ?  ?Exercises General Exercises - Lower Extremity ?Ankle Circles/Pumps: AROM, Both, 5 reps, Seated ?Heel Slides: AAROM, Both, 5 reps, Supine ? ?  ?General Comments General comments (skin integrity, edema, etc.): VSS ?  ?  ? ?Pertinent Vitals/Pain Pain Assessment ?Pain Assessment: Faces ?Faces Pain Scale: Hurts whole lot ?Pain Location: R hip with mobility ?Pain Descriptors / Indicators: Grimacing, Guarding, Discomfort, Moaning ?Pain Intervention(s): Limited activity within patient's tolerance, Monitored during session, Premedicated before session, Repositioned (received IV dilaudid prior to session)  ? ? ?Home Living   ?  ?  ?  ?  ?  ?  ?  ?  ?  ?   ?  ?Prior Function    ?  ?  ?   ? ?PT Goals (current goals can now be found in the care plan section) Progress towards PT goals: Progressing toward goals ? ?  ?Frequency ? ? ? Min 3X/week ? ? ? ?  ?PT Plan Frequency needs to be updated  ? ? ?Co-evaluation   ?  ?  ?  ?  ? ?  ?AM-PAC PT "6 Clicks" Mobility   ?Outcome Measure ? Help needed turning from your back to your side while in a flat bed without using bedrails?: Total ?Help needed moving from lying on your back to sitting on the side of a flat bed without using bedrails?: Total ?Help needed moving to and from a bed to a chair (including a wheelchair)?: Total ?Help needed standing up from a chair using your arms (e.g., wheelchair or bedside chair)?: Total ?Help needed to walk in hospital room?: Total ?Help needed climbing 3-5 steps with a railing? : Total ?6 Click Score: 6 ? ?  ?End of Session Equipment Utilized During Treatment: Gait belt ?Activity Tolerance: Patient limited by pain (and incontinence) ?Patient left: in bed;with call  bell/phone within reach;with bed alarm set ?Nurse Communication: Mobility status ?PT Visit Diagnosis: Muscle weakness (generalized) (M62.81);Difficulty in walking, not elsewhere classified (R26.2) ?  ? ? ?Time: 9470-9628 ?PT Time Calculation (min) (ACUTE ONLY): 35 min ? ?Charges:  $Therapeutic Activity: 23-37 mins          ?          ? ?Abran [name], PT ?Acute Rehab Services ?Pager [phone] ?[facility] [phone] ? ? ? ?Mikael Spray Cheyne Bungert ?08/13/2021, 3:01 PM ? ?

## 2021-08-13 NOTE — Progress Notes (Signed)
?PROGRESS NOTE ?Kerri [name]  MAU:[id number] DOB: 17-Nov-1929 DOA: 08/09/2021 ?PCP: Lavone Orn, MD  ? ?Brief Narrative/Hospital Course: ?88 y.o. female with medical history significant of hypertension history of syncope cluster headaches, asthma, CAD, chronic back pain, depression, HLD, GERD, hypothyroidism, irritable bowel syndrome, history of melanoma presented to the ED after a fall that was 5 days PTA a and could not get up.  Per report while going to the shower tripped on her shoes and fell down and hurt her hip back, did not have a fall to call anybody. She is admitted for  Closed 2-part intertrochanteric fracture of right femur, traumatic rhabdomyolysis. Had abnormal ecg and pt could not provide hx,and had small run of NSVT, patient was admitted for orthopedic evaluation, cardiology was consulted for preoperative evaluation.  In the ED CT head no acute finding, but with severe atherosclerotic plaque of the carotid arteries within the neck, chest x-ray no acute finding CT abdomen pelvis no acute finding, CTA chest no evidence of infiltrate. ?Patient was admitted and seen by cardiology in preop evaluation underwent echocardiogram and carotid duplex.  Underwent IM nailing of the right femur postop course relatively stable but with verbal aggression, psych was consulted.  She also had acute blood loss anemia in the setting of hip fracture, also treated for UTI.  Continued on pain management IV antibiotics. ?Plan is for skilled nursing facility once cleared by orthopedics and urine culture resulted  ?  ?Subjective: ?Seen and examined this morning.  Still complains of pain on moving.   ?Hemoglobin further downtrending, urine culture is pending.   ?Remains on oxygen at 2 L nasal cannula  ? ?Assessment and Plan: ?Principal Problem: ?  Hip fracture ([place]) ?Active Problems: ?    Hypokalemia ?  Elevated CK ?  Elevated troponin ?  UTI (urinary tract infection) ?  Hypothyroidism ?  Hyperlipidemia ?  Essential hypertension ?   Coronary atherosclerosis ?  Carotid bruit ?  Dehydration ?  NSVT (nonsustained ventricular tachycardia) ?  Normocytic anemia ?  Protein-calorie malnutrition, severe ?  Aggression ?  ?Closed 2 part intertrochanteric fracture of the right femur status post mechanical fall: Seen by cardiology and preop underwent echocardiogram and carotid duplex. S/P IM fixation Rt femur by Dr. Lyla Glassing 3/22/23Ortho continuing on Aspirin 325 daily, Robaxin, Dilaudid p.o./IV, stool softener and MiraLAX as needed by orthopedics team.  Defer prescription for narcotics to orthopedics. ? ?Traumatic rhabdomyolysis: s/p ivf. resolved ?Dehydration: Resolved.Encourage p.o.  ?Hypokalemia:resolved ? ?Normocytic anemia ?Folate deficiency anemia ?Acute blood loss anemia ?hb in January was 12.2 g but before that in May/25/21- 8.9 g.  Now downtrending from 12 gm- this am 8.1 gm. - this is 2/2 Acute blood loss due to hip fracture and hemodilution from IV fluids.  If further downtrending will transfuse PRBC-monitor 1 more day. Anemia panel suggestive of low folic acid with a stable B12 and iron . supplement folate,iron. ?Recent Labs  ?Lab 08/09/21 ?1906 08/10/21 ?0450 08/11/21 ?0439 08/12/21 ?0427 08/13/21 ?5638  ?HGB 12.6 10.9* 9.2* 8.3* 8.1*  ?HCT 38.5 34.4* 29.2* 26.4* 25.3*  ? ?  ?Abnormal EKG-with ST depression diffuse ?NSVT  ?CAD: ?Mildly elevated troponin 141-1 52: Likely demand ischemia. ?Cardiology consulted appreciate input denies chest pain.  Echo EF more than 70%, no RWMA, has G1 DD  ? ?Hyperlipidemia: Resume home meds on dc. ?Hypertension on chlorthalidone at home.BP is well controlled diuretics on hold. ?Atherosclerotic plaque in the carotid arteries in the CT scan incidentally noted, carotid duplex no acute finding. ? ?  Hypothyroidism:on synthroid.Euthyroid with TSH 1.4 ? ?Leukocytosis/UTI:UA with WBC 21-50, urine culture w/ GNR >100,0000.Cont ceftriaxone urine cx is still pending. ? ?DVT prophylaxis: SCDs Start: 08/11/21 0121 ASA 325 mg  per ortho ?Code Status:   Code Status: Full Code ?Family Communication: plan of care discussed with patient/daughter at bedside 3/22. ?Patient status is: Inpatient level of care: Telemetry  ?Remains inpatient because: Ongoing management of post  hip fracture ?Patient currently not stable ? ?Dispo: The patient is from:Home ?           Anticipated disposition: SNFonce cleared by ortho/and urine culture back.  Repeat CBC in a.m.Anticipated tomorrow ? ?Mobility Assessment (last 72 hours)   ? ? Mobility Assessment   ? ? [place] Name 08/12/21 2000 08/12/21 1300 08/12/21 1219 08/11/21 2217 08/11/21 1500  ? Does patient have an order for bedrest or is patient medically unstable No - Continue assessment -- No - Continue assessment No - Continue assessment --  ? What is the highest level of mobility based on the progressive mobility assessment? Level 2 ([place]) - Balance while sitting on edge of bed and cannot stand Level 2 (Chairfast) - Balance while sitting on edge of bed and cannot stand Level 2 (Chairfast) - Balance while sitting on edge of bed and cannot stand Level 2 (Chairfast) - Balance while sitting on edge of bed and cannot stand Level 2 (Chairfast) - Balance while sitting on edge of bed and cannot stand  ? Is the above level different from baseline mobility prior to current illness? Yes - Recommend PT order -- Yes - Recommend PT order Yes - Recommend PT order --  ? ? [place] Name 08/11/21 1400 08/11/21 0800  ?  ?  ?  ? Does patient have an order for bedrest or is patient medically unstable -- No - Continue assessment     ? What is the highest level of mobility based on the progressive mobility assessment? Level 2 (Chairfast) - Balance while sitting on edge of bed and cannot stand Level 2 (Chairfast) - Balance while sitting on edge of bed and cannot stand     ? Is the above level different from baseline mobility prior to current illness? -- Yes - Recommend PT order     ? ?  ?  ? ?  ?  ? ?Objective: ?Vitals last 24  hrs: ?Vitals:  ? 08/12/21 0548 08/12/21 1343 08/12/21 2041 08/13/21 0546  ?BP: 123/64 124/64 121/63 126/74  ?Pulse: 62 88 91 77  ?Resp: '14 18 16 16  '$ ?Temp: 98 ?F (36.7 ?C) 97.7 ?F (36.5 ?C) 98 ?F (36.7 ?C) (!) 97.5 ?F (36.4 ?C)  ?TempSrc: Oral Oral Oral Oral  ?SpO2: 99% 99% 100% 100%  ?Weight:      ?Height:      ? ?Weight change:  ? ?Physical Examination: ?General exam: AAO, not verbally aggressive today ?HEENT:Oral mucosa moist, Ear/Nose WNL grossly, dentition normal. ?Respiratory system: bilaterally clear,no use of accessory muscle ?Cardiovascular system: S1 & S2 +, No JVD,. ?Gastrointestinal system: Abdomen soft,NT,ND, BS+ ?Nervous System:Alert, awake, moving extremities and grossly nonfocal ?Extremities: edema neg, surgical site on the hip with dressing in place distal peripheral pulses palpable.  ?Skin: No rashes,no icterus. ?MSK: Normal muscle bulk,tone, power ? ? ?Medications reviewed:  ?Scheduled Meds: ? aspirin EC  325 mg Oral Q breakfast  ? calcium carbonate  1,250 mg Oral Daily  ? cholecalciferol  2,000 Units Oral Daily  ? docusate sodium  100 mg Oral BID  ? feeding supplement  1 Container Oral TID BM  ? ferrous sulfate  325 mg Oral Q breakfast  ? folic acid  1 mg Oral Daily  ? gabapentin  300 mg Oral TID  ? levothyroxine  75 mcg Oral Q0600  ? multivitamin with minerals  1 tablet Oral Daily  ? nutrition supplement (JUVEN)  1 packet Oral BID BM  ? potassium chloride  20 mEq Oral Daily  ? rosuvastatin  10 mg Oral QHS  ? senna  1 tablet Oral BID  ? vitamin B-12  500 mcg Oral Daily  ? ?Continuous Infusions: ? cefTRIAXone (ROCEPHIN)  IV 1 g (08/13/21 8309)  ? methocarbamol (ROBAXIN) IV    ? ? ?  ?Diet Order   ? ?       ?  Diet regular Room service appropriate? Yes; Fluid consistency: Thin  Diet effective now       ?  ? ?  ?  ? ?  ? ?Unresulted Labs (From admission, onward)  ? ? None  ? ?  ?ata Reviewed: I have personally reviewed following labs and imaging studies ?CBC: ?Recent Labs  ?Lab 08/15/21 ?1906  08/10/21 ?0450 08/11/21 ?0439 08/12/21 ?0427 08/13/21 ?4076  ?WBC 15.7* 12.9* 8.4 8.0 6.6  ?NEUTROABS 12.4* 10.2*  --   --   --   ?HGB 12.6 10.9* 9.2* 8.3* 8.1*  ?HCT 38.5 34.4* 29.2* 26.4* 25.3*  ?MCV 86.3 87.8

## 2021-08-14 DIAGNOSIS — S72001A Fracture of unspecified part of neck of right femur, initial encounter for closed fracture: Secondary | ICD-10-CM | POA: Diagnosis not present

## 2021-08-14 LAB — CBC
HCT: 27.8 % — ABNORMAL LOW (ref 36.0–46.0)
Hemoglobin: 9.1 g/dL — ABNORMAL LOW (ref 12.0–15.0)
MCH: 30.8 pg (ref 26.0–34.0)
MCHC: 32.7 g/dL (ref 30.0–36.0)
MCV: 94.2 fL (ref 80.0–100.0)
Platelets: 235 10*3/uL (ref 150–400)
RBC: 2.95 MIL/uL — ABNORMAL LOW (ref 3.87–5.11)
RDW: 15.9 % — ABNORMAL HIGH (ref 11.5–15.5)
WBC: 8.6 10*3/uL (ref 4.0–10.5)
nRBC: 0 % (ref 0.0–0.2)

## 2021-08-14 LAB — BASIC METABOLIC PANEL
Anion gap: 7 (ref 5–15)
BUN: 14 mg/dL (ref 8–23)
CO2: 29 mmol/L (ref 22–32)
Calcium: 8.4 mg/dL — ABNORMAL LOW (ref 8.9–10.3)
Chloride: 100 mmol/L (ref 98–111)
Creatinine, Ser: 0.56 mg/dL (ref 0.44–1.00)
GFR, Estimated: >60 mL/min (ref >60)
Glucose, Bld: 137 mg/dL — ABNORMAL HIGH (ref 70–99)
Potassium: 3.6 mmol/L (ref 3.5–5.1)
Sodium: 136 mmol/L (ref 135–145)

## 2021-08-14 NOTE — TOC Progression Note (Signed)
Transition of Care (TOC) - Progression Note  ? ? ?Patient Details  ?Name: Kerri [name] ?MRN: [id number] ?Date of Birth: 1930-04-04 ? ?Transition of Care (TOC) CM/SW Contact  ?Ross Ludwig, LCSW ?Phone Number: ?08/14/2021, 10:58 AM ? ?Clinical Narrative:    ?CSW was informed by [facility] that they can not accept patient today due to not having enough staffing and they are not sure if they can get her pain medication.  CSW spoke to patient's daughter [name] and answered questions that she had.  Patient's daughter had some specific SNF questions, CSW asked [facility] rep Claiborne Billings to speak to her.  TOC to continue to follow patient's progress throughout discharge planning. ? ? ?Expected Discharge Plan: [place] ?Barriers to Discharge: SNF Pending bed offer ? ?Expected Discharge Plan and Services ?Expected Discharge Plan: [place] ?  ?Discharge Planning Services: CM Consult ?Post Acute Care Choice: [place] ?Living arrangements for the past 2 months: Apartment ?                ?  ?  ?  ?  ?  ?  ?  ?  ?  ?  ? ? ?Social Determinants of Health (SDOH) Interventions ?  ? ?Readmission Risk Interventions ?   ? View : No data to display.  ?  ?  ?  ? ? ?

## 2021-08-14 NOTE — Plan of Care (Signed)

## 2021-08-14 NOTE — Discharge Summary (Incomplete)
Physician Discharge Summary  ?Kerri [name] XNA:[id number] DOB: 1929-10-06 DOA: 08/09/2021 ? ?PCP: Lavone Orn, MD ? ?Admit date: 08/09/2021 ?Discharge date: 08/15/2021 ?Recommendations for Outpatient Follow-up:  ?Follow up with PCP in 1 weeks-call for appointment ?Please obtain BMP/CBC in one week ? ?Discharge Dispo: SNF ?Discharge Condition: Stable ?Code Status:   Code Status: Full Code ?Diet recommendation:  ?Diet Order   ? ?       ?  Diet - low sodium heart healthy       ?  ?  Diet regular Room service appropriate? Yes; Fluid consistency: Thin  Diet effective now       ?  ? ?  ?  ? ?  ?  ? ?Brief/Interim Summary: ?88 y.o. female with medical history significant of hypertension history of syncope cluster headaches, asthma, CAD, chronic back pain, depression, HLD, GERD, hypothyroidism, irritable bowel syndrome, history of melanoma presented to the ED after a fall that was 5 days PTA a and could not get up.  Per report while going to the shower tripped on her shoes and fell down and hurt her hip back, did not have a fall to call anybody. She is admitted for  Closed 2-part intertrochanteric fracture of right femur, traumatic rhabdomyolysis. Had abnormal ecg and pt could not provide hx,and had small run of NSVT, patient was admitted for orthopedic evaluation, cardiology was consulted for preoperative evaluation.  In the ED CT head no acute finding, but with severe atherosclerotic plaque of the carotid arteries within the neck, chest x-ray no acute finding CT abdomen pelvis no acute finding, CTA chest no evidence of infiltrate. ?Patient was admitted and seen by cardiology in preop evaluation underwent echocardiogram and carotid duplex.  Underwent IM nailing of the right femur postop course relatively stable but with verbal aggression, psych was consulted.  She also had acute blood loss anemia in the setting of hip fracture, also treated for UTI.  Continued on pain management IV antibiotics. ?Plan is for skilled nursing  facility once cleared by orthopedics and urine culture resulted.  Urine culture grew ESBL, patient was managed with meropenem, sensitive to nitrofurantoin.  Hemoglobin now is uptrending and 9.1 g, she is stable for discharge to skilled nursing facility.  ?Chest x-ray done this morning no acute disease, wean her off oxygen if unable can go to snf with oxygen-we will encourage incentive spirometry ambulation. ? ?Discharge Diagnoses:  ?Principal Problem: ?  Hip fracture ([place]) ?Active Problems: ?    Hypokalemia ?  Elevated CK ?  Elevated troponin ?  UTI (urinary tract infection) ?  Hypothyroidism ?  Hyperlipidemia ?  Essential hypertension ?  Coronary atherosclerosis ?  Carotid bruit ?  Dehydration ?  NSVT (nonsustained ventricular tachycardia) ?  Normocytic anemia ?  Protein-calorie malnutrition, severe ?  Aggression ? ?Closed 2 part intertrochanteric fracture of the right femur status post mechanical fall: Seen by cardiology and preop underwent echocardiogram and carotid duplex. S/P IM fixation Rt femur by Dr. Lyla Glassing 3/22/23Ortho continuing on Aspirin 325 daily, Robaxin, oral opiates for pain control patient is on Nucynta at home.  Cleared for discharge to skilled nursing facility. ?  ?Traumatic rhabdomyolysis: s/p ivf. resolved ?Dehydration: Resolved.Encourage p.o.  ?Hypokalemia:resolved ?  ?Normocytic anemia ?Folate deficiency anemia ?Acute blood loss anemia ?hb in January was 12.2 g but before that in May/25/21- 8.9 g.  Hemoglobin was as low as 8.1 g at this time improving at 9.1 g, no need for blood transfusion.  Check CBC in 1 week.  Anemia panel suggestive of low folic acid with a stable B12 and iron . supplement folate,iron. ?Recent Labs  ?Lab 08/10/21 ?0450 08/11/21 ?0439 08/12/21 ?0427 08/13/21 ?3976 08/14/21 ?0509  ?HGB 10.9* 9.2* 8.3* 8.1* 9.1*  ?HCT 34.4* 29.2* 26.4* 25.3* 27.8*  ?  ? ?Abnormal EKG-with ST depression diffuse ?NSVT  ?CAD: ?Mildly elevated troponin 141-1 52: Likely demand  ischemia. ?Cardiology consulted appreciate input denies chest pain.  Echo EF more than 70%, no RWMA, has G1 DD  ?  ?Hyperlipidemia: home meds on dc. ?Hypertension on chlorthalidone at home.BP is well controlled diuretics on hold-consider resume statin on discharge. ?Atherosclerotic plaque in the carotid arteries in the CT scan incidentally noted, carotid duplex no acute finding. ?  ?Hypothyroidism:on synthroid.Euthyroid with TSH 1.4 ?  ?Leukocytosis/ ?ESBL UTI: ?Patient was getting ceftriaxone and overall was stable, after sensitivity we changed to meropenem.   Likely low-grade.  Will give dose of fosfomycin x1  at discharge. ? ?Skin wound on the back initially blackish eschar not clearing up suspect from trauma wound care consult, cont wound care. ? ? ?Consults: ?orthopedics ?Subjective: ?Resting company denies shortness of breath no new complaints. ? ?Discharge Exam: ?Vitals:  ? 08/14/21 2038 08/15/21 0407  ?BP: 130/62 120/72  ?Pulse: (!) 101 94  ?Resp: 20 16  ?Temp: 99.9 ?F (37.7 ?C) 98.6 ?F (37 ?C)  ?SpO2: 98% 98%  ? ?General: Pt is alert, awake, not in acute distress ?Cardiovascular: RRR, S1/S2 +, no rubs, no gallops ?Respiratory: CTA bilaterally, no wheezing, no rhonchi ?Abdominal: Soft, NT, ND, bowel sounds + ?Extremities: no edema, no cyanosis ? ?Discharge Instructions ? ?Discharge Instructions   ? ? Diet - low sodium heart healthy   Complete by: As directed ?  ? Discharge instructions   Complete by: As directed ?  ? Follow-up with orthopedic surgeon 2 weeks from day of surgery for further care. ? ?Please call call MD or return to ER for similar or worsening recurring problem that brought you to hospital or if any fever,nausea/vomiting,abdominal pain, uncontrolled pain, chest pain,  shortness of breath or any other alarming symptoms. ? ?Please follow-up your doctor as instructed in a week time and call the office for appointment. ? ?Please avoid alcohol, smoking, or any other illicit substance and maintain  healthy habits including taking your regular medications as prescribed. ? ?You were cared for by a hospitalist during your hospital stay. If you have any questions about your discharge medications or the care you received while you were in the hospital after you are discharged, you can call the unit and ask to speak with the hospitalist on call if the hospitalist that took care of you is not available. ? ?Once you are discharged, your primary care physician will handle any further medical issues. Please note that NO REFILLS for any discharge medications will be authorized once you are discharged, as it is imperative that you return to your primary care physician (or establish a relationship with a primary care physician if you do not have one) for your aftercare needs so that they can reassess your need for medications and monitor your lab values  ? Discharge wound care:   Complete by: As directed ?  ? Place single layer of xeroform over pressure injury, cover with silicone foam, change every 3 days. Assess under dressings each shift for any acute changes in the wounds ? ?Reinforce dressing as needed for Aquacel dressing on the hip-follow-up in orthopedic surgery as instructed  ? Increase activity slowly  Complete by: As directed ?  ? ?  ? ?Allergies as of 08/15/2021   ? ?   Reactions  ? Buprenorphine Hcl Itching, Other (See Comments)  ? Happened awhile back- patient noted that any narcotic-based medication makes her have a "bad reaction"  ? Codeine Nausea And Vomiting, Other (See Comments)  ? Patient gets sick to her stomach  ? Morphine Itching, Other (See Comments)  ? Happened awhile back- patient noted that any narcotic-based medication makes her have a "bad reaction"  ? Morphine And Related Itching, Other (See Comments)  ? Happened awhile back- patient noted that any narcotic-based medication makes her have a "bad reaction"  ? Tramadol Nausea And Vomiting, Other (See Comments)  ? Confusion and severe headaches,  also  ? Adhesive [tape] Other (See Comments)  ? Tears the skin  ? Doxycycline Hyclate Other (See Comments)  ? Makes the patient "ill"  ? Hydrocodone-acetaminophen Nausea And Vomiting  ? Lactose Intolerance (gi) Nausea Only,

## 2021-08-14 NOTE — Progress Notes (Addendum)
?PROGRESS NOTE ?Kerri [name]  XIP:[id number] DOB: [birth year] DOA: 08/09/2021 ?PCP: Lavone Orn, MD  ? ?Brief Narrative/Hospital Course: ?88 y.o. female with medical history significant of hypertension history of syncope cluster headaches, asthma, CAD, chronic back pain, depression, HLD, GERD, hypothyroidism, irritable bowel syndrome, history of melanoma presented to the ED after a fall that was 5 days PTA a and could not get up.  Per report while going to the shower tripped on her shoes and fell down and hurt her hip back, did not have a fall to call anybody. She is admitted for  Closed 2-part intertrochanteric fracture of right femur, traumatic rhabdomyolysis. Had abnormal ecg and pt could not provide hx,and had small run of NSVT, patient was admitted for orthopedic evaluation, cardiology was consulted for preoperative evaluation.  In the ED CT head no acute finding, but with severe atherosclerotic plaque of the carotid arteries within the neck, chest x-ray no acute finding CT abdomen pelvis no acute finding, CTA chest no evidence of infiltrate. ?Patient was admitted and seen by cardiology in preop evaluation underwent echocardiogram and carotid duplex.  Underwent IM nailing of the right femur postop course relatively stable but with verbal aggression, psych was consulted.  She also had acute blood loss anemia in the setting of hip fracture, also treated for UTI.  Continued on pain management IV antibiotics. ?Plan is for skilled nursing facility once cleared by orthopedics and urine culture resulted.  Urine culture grew ESBL, patient was managed with meropenem, sensitive to nitrofurantoin.  Hemoglobin now is uptrending and 9.1 g, she is stable for discharge to skilled nursing facility.  ?  ?Subjective: ?Seen this morning no new complaints hemoglobin is up.  Has pain on the fracture site.  ?Patient appears calm comfortable not verbally aggressive. ?Assessment and Plan: ?Principal Problem: ?  Hip fracture ([place]) ?Active  Problems: ?    Hypokalemia ?  Elevated CK ?  Elevated troponin ?  UTI (urinary tract infection) ?  Hypothyroidism ?  Hyperlipidemia ?  Essential hypertension ?  Coronary atherosclerosis ?  Carotid bruit ?  Dehydration ?  NSVT (nonsustained ventricular tachycardia) ?  Normocytic anemia ?  Protein-calorie malnutrition, severe ?  Aggression ?  ?Closed 2 part intertrochanteric fracture of the right femur status post mechanical fall: Seen by cardiology and preop underwent echocardiogram and carotid duplex. S/P IM fixation Rt femur by Dr. Lyla Glassing 3/22/23Ortho continuing on Aspirin 325 daily, Robaxin, Dilaudid p.o./IV, stool softener and MiraLAX as needed by orthopedics team.  Defer prescription for narcotics to orthopedics. ? ?ESBL E COLI UTI:UA with WBC 21-50.continue meropenem for now will discharge on nitrofurantoin ? ?Traumatic rhabdomyolysis: s/p ivf. resolved ?Dehydration: Resolved.Encourage p.o.  ?Hypokalemia:resolved ? ?Normocytic anemia ?Folate deficiency anemia ?Acute blood loss anemia ?hb in January was 12.2 g but before that in May/25/21- 8.9 g.  Improved to 9.1 g.  Continue folic acid supplementation and iron supplementation.  Check CBC in 1 week.  anemia this is 2/2 Acute blood loss due to hip fracture and hemodilution from IV fluids.Anemia panel suggestive of low folic acid with a stable B12 and iron . ?Recent Labs  ?Lab 08/10/21 ?0450 08/11/21 ?0439 08/12/21 ?0427 08/13/21 ?6734 08/14/21 ?0509  ?HGB 10.9* 9.2* 8.3* 8.1* 9.1*  ?HCT 34.4* 29.2* 26.4* 25.3* 27.8*  ? ?  ?Abnormal EKG-with ST depression diffuse ?NSVT  ?CAD: ?Mildly elevated troponin 141-1 52: Likely demand ischemia. ?Cardiology consulted appreciate input denies chest pain.  Echo EF more than 70%, no RWMA, has G1 DD.  Stable ? ?Hyperlipidemia:  Resume home meds on dc. ?Hypertension on chlorthalidone at home.BP is well controlled diuretics on hold. ?Atherosclerotic plaque in the carotid arteries in the CT scan incidentally noted, carotid duplex  no acute finding. ? ?Hypothyroidism:on synthroid.Euthyroid with TSH 1.4 ? ?DVT prophylaxis: SCDs Start: 08/11/21 0121 ASA 325 mg per ortho ?Code Status:   Code Status: Full Code ?Family Communication: plan of care discussed with patient/daughter at bedside 3/22. ?Patient status is: Inpatient level of care: Telemetry  ?Remains inpatient because: Ongoing management of post  hip fracture ?Patient currently not stable ? ?Dispo: The patient is from:Home ?           Anticipated disposition: SNF.SNF unable to admit today anticipate discharge tomorrow  ? ?Mobility Assessment (last 72 hours)   ? ? Mobility Assessment   ? ? [place] Name 08/13/21 2130 08/13/21 1459 08/13/21 0815 08/12/21 2000 08/12/21 1300  ? Does patient have an order for bedrest or is patient medically unstable No - Continue assessment -- No - Continue assessment No - Continue assessment --  ? What is the highest level of mobility based on the progressive mobility assessment? Level 1 (Bedfast) - Unable to balance while sitting on edge of bed Level 2 (Chairfast) - Balance while sitting on edge of bed and cannot stand Level 2 (Chairfast) - Balance while sitting on edge of bed and cannot stand Level 2 (Chairfast) - Balance while sitting on edge of bed and cannot stand Level 2 (Chairfast) - Balance while sitting on edge of bed and cannot stand  ? Is the above level different from baseline mobility prior to current illness? Yes - Recommend PT order -- Yes - Recommend PT order Yes - Recommend PT order --  ? ? Page Name 08/12/21 1219 08/11/21 2217 08/11/21 1500 08/11/21 1400  ?  ? Does patient have an order for bedrest or is patient medically unstable No - Continue assessment No - Continue assessment -- --   ? What is the highest level of mobility based on the progressive mobility assessment? Level 2 (Chairfast) - Balance while sitting on edge of bed and cannot stand Level 2 (Chairfast) - Balance while sitting on edge of bed and cannot stand Level 2 (Chairfast) -  Balance while sitting on edge of bed and cannot stand Level 2 (Chairfast) - Balance while sitting on edge of bed and cannot stand   ? Is the above level different from baseline mobility prior to current illness? Yes - Recommend PT order Yes - Recommend PT order -- --   ? ?  ?  ? ?  ?  ? ?Objective: ?Vitals last 24 hrs: ?Vitals:  ? 08/13/21 0546 08/13/21 1419 08/13/21 2016 08/14/21 0445  ?BP: 126/74 123/76 137/67 113/67  ?Pulse: 77 82 93 87  ?Resp: '16 18 20 20  '$ ?Temp: (!) 97.5 ?F (36.4 ?C) 97.7 ?F (36.5 ?C) 98 ?F (36.7 ?C) 98.6 ?F (37 ?C)  ?TempSrc: Oral Oral Oral   ?SpO2: 100% 98% 98% 98%  ?Weight:      ?Height:      ? ?Weight change:  ? ?Physical Examination: ?General exam: AA0X3, older than stated age, weak appearing. ?HEENT:Oral mucosa moist, Ear/Nose WNL grossly, dentition normal. ?Respiratory system: bilaterally diminished, no use of accessory muscle ?Cardiovascular system: S1 & S2 +, No JVD,. ?Gastrointestinal system: Abdomen soft,NT,ND,BS+ ?Nervous System:Alert, awake, moving extremities and grossly nonfocal ?Extremities: LE ankle edema NONE, right hip surgical site wound Aquacel dressing in place, distal peripheral pulses palpable.  ?Skin: No rashes,no icterus. ?MSK:  Normal muscle bulk,tone, power  ? ? ?Medications reviewed:  ?Scheduled Meds: ? aspirin EC  325 mg Oral Q breakfast  ? calcium carbonate  1,250 mg Oral Daily  ? cholecalciferol  2,000 Units Oral Daily  ? docusate sodium  100 mg Oral BID  ? feeding supplement  1 Container Oral TID BM  ? ferrous sulfate  325 mg Oral Q breakfast  ? folic acid  1 mg Oral Daily  ? gabapentin  300 mg Oral TID  ? levothyroxine  75 mcg Oral Q0600  ? multivitamin with minerals  1 tablet Oral Daily  ? nutrition supplement (JUVEN)  1 packet Oral BID BM  ? potassium chloride  20 mEq Oral Daily  ? rosuvastatin  10 mg Oral QHS  ? senna  1 tablet Oral BID  ? vitamin B-12  500 mcg Oral Daily  ? ?Continuous Infusions: ? meropenem (MERREM) IV 1 g (08/14/21 0542)  ? methocarbamol  (ROBAXIN) IV 500 mg (08/13/21 2133)  ? ? ?  ?Diet Order   ? ?       ?  Diet regular Room service appropriate? Yes; Fluid consistency: Thin  Diet effective now       ?  ? ?  ?  ? ?  ? ?Unresulted Labs (From ad

## 2021-08-15 ENCOUNTER — Inpatient Hospital Stay (HOSPITAL_COMMUNITY): Payer: Medicare Other

## 2021-08-15 ENCOUNTER — Encounter (HOSPITAL_COMMUNITY): Payer: Self-pay | Admitting: Internal Medicine

## 2021-08-15 DIAGNOSIS — R2990 Unspecified symptoms and signs involving the nervous system: Secondary | ICD-10-CM

## 2021-08-15 DIAGNOSIS — G9341 Metabolic encephalopathy: Secondary | ICD-10-CM

## 2021-08-15 DIAGNOSIS — S72001A Fracture of unspecified part of neck of right femur, initial encounter for closed fracture: Secondary | ICD-10-CM | POA: Diagnosis not present

## 2021-08-15 DIAGNOSIS — E872 Acidosis, unspecified: Secondary | ICD-10-CM

## 2021-08-15 DIAGNOSIS — F05 Delirium due to known physiological condition: Secondary | ICD-10-CM | POA: Diagnosis not present

## 2021-08-15 DIAGNOSIS — N179 Acute kidney failure, unspecified: Secondary | ICD-10-CM

## 2021-08-15 LAB — LACTIC ACID, PLASMA
Lactic Acid, Venous: 2.9 mmol/L (ref 0.5–1.9)
Lactic Acid, Venous: 1 mmol/L (ref 0.5–1.9)

## 2021-08-15 LAB — URINALYSIS, ROUTINE W REFLEX MICROSCOPIC
Bilirubin Urine: NEGATIVE
Glucose, UA: NEGATIVE mg/dL
Ketones, ur: NEGATIVE mg/dL
Nitrite: POSITIVE — AB
Protein, ur: NEGATIVE mg/dL
Specific Gravity, Urine: 1.013 (ref 1.005–1.030)
WBC, UA: >50 WBC/hpf — ABNORMAL HIGH (ref 0–5)
pH: 5 (ref 5.0–8.0)

## 2021-08-15 LAB — CBC
HCT: 30.7 % — ABNORMAL LOW (ref 36.0–46.0)
Hemoglobin: 9.7 g/dL — ABNORMAL LOW (ref 12.0–15.0)
MCH: 28 pg (ref 26.0–34.0)
MCHC: 31.6 g/dL (ref 30.0–36.0)
MCV: 88.7 fL (ref 80.0–100.0)
Platelets: 250 10*3/uL (ref 150–400)
RBC: 3.46 MIL/uL — ABNORMAL LOW (ref 3.87–5.11)
RDW: 14.9 % (ref 11.5–15.5)
WBC: 13.1 10*3/uL — ABNORMAL HIGH (ref 4.0–10.5)
nRBC: 0.2 % (ref 0.0–0.2)

## 2021-08-15 LAB — COMPREHENSIVE METABOLIC PANEL
ALT: 7 U/L (ref 0–44)
AST: 25 U/L (ref 15–41)
Albumin: 2.5 g/dL — ABNORMAL LOW (ref 3.5–5.0)
Alkaline Phosphatase: 66 U/L (ref 38–126)
Anion gap: 8 (ref 5–15)
BUN: 27 mg/dL — ABNORMAL HIGH (ref 8–23)
CO2: 27 mmol/L (ref 22–32)
Calcium: 8.4 mg/dL — ABNORMAL LOW (ref 8.9–10.3)
Chloride: 99 mmol/L (ref 98–111)
Creatinine, Ser: 2.04 mg/dL — ABNORMAL HIGH (ref 0.44–1.00)
GFR, Estimated: 23 mL/min — ABNORMAL LOW (ref >60)
Glucose, Bld: 134 mg/dL — ABNORMAL HIGH (ref 70–99)
Potassium: 4.1 mmol/L (ref 3.5–5.1)
Sodium: 134 mmol/L — ABNORMAL LOW (ref 135–145)
Total Bilirubin: 0.3 mg/dL (ref 0.3–1.2)
Total Protein: 6.4 g/dL — ABNORMAL LOW (ref 6.5–8.1)

## 2021-08-15 LAB — LIPID PANEL
Cholesterol: 98 mg/dL (ref 0–200)
HDL: 29 mg/dL — ABNORMAL LOW (ref >40)
LDL Cholesterol: 42 mg/dL (ref 0–99)
Total CHOL/HDL Ratio: 3.4 RATIO
Triglycerides: 136 mg/dL (ref <150)
VLDL: 27 mg/dL (ref 0–40)

## 2021-08-15 LAB — BLOOD GAS, ARTERIAL
Acid-Base Excess: 2.7 mmol/L — ABNORMAL HIGH (ref 0.0–2.0)
Bicarbonate: 26.2 mmol/L (ref 20.0–28.0)
O2 Saturation: 95.3 %
Patient temperature: 38.6
pCO2 arterial: 39 mmHg (ref 32–48)
pH, Arterial: 7.45 (ref 7.35–7.45)
pO2, Arterial: 80 mmHg — ABNORMAL LOW (ref 83–108)

## 2021-08-15 LAB — GLUCOSE, CAPILLARY: Glucose-Capillary: 135 mg/dL — ABNORMAL HIGH (ref 70–99)

## 2021-08-15 LAB — HEMOGLOBIN A1C
Hgb A1c MFr Bld: 5.9 % — ABNORMAL HIGH (ref 4.8–5.6)
Mean Plasma Glucose: 122.63 mg/dL

## 2021-08-15 LAB — TROPONIN I (HIGH SENSITIVITY): Troponin I (High Sensitivity): 10 ng/L (ref <18)

## 2021-08-15 MED ORDER — TAPENTADOL HCL 50 MG PO TABS: 50.0000 mg | ORAL_TABLET | Freq: Three times a day (TID) | ORAL | 0 refills | Status: DC | PRN

## 2021-08-15 MED ORDER — SODIUM CHLORIDE 0.9 % IV BOLUS
500.0000 mL | Freq: Once | INTRAVENOUS | Status: AC
  Administered 2021-08-15: 500 mL via INTRAVENOUS

## 2021-08-15 MED ORDER — FERROUS SULFATE 325 (65 FE) MG PO TABS: 325.0000 mg | ORAL_TABLET | Freq: Every day | ORAL | 3 refills | Status: AC

## 2021-08-15 MED ORDER — SODIUM CHLORIDE 0.9 % IV SOLN
1.0000 g | Freq: Two times a day (BID) | INTRAVENOUS | Status: AC
  Administered 2021-08-15 – 2021-08-17 (×5): 1 g via INTRAVENOUS
  Filled 2021-08-15 (×5): qty 20

## 2021-08-15 MED ORDER — SENNA 8.6 MG PO TABS: 1.0000 | ORAL_TABLET | Freq: Two times a day (BID) | ORAL | 0 refills | Status: DC

## 2021-08-15 MED ORDER — FOLIC ACID 1 MG PO TABS: 1.0000 mg | ORAL_TABLET | Freq: Every day | ORAL | Status: DC

## 2021-08-15 MED ORDER — JUVEN PO PACK: 1.0000 | PACK | Freq: Two times a day (BID) | ORAL | 0 refills | Status: DC

## 2021-08-15 MED ORDER — SODIUM CHLORIDE 0.9 % IV SOLN: INTRAVENOUS | Status: AC

## 2021-08-15 MED ORDER — SODIUM CHLORIDE 0.9 % IV BOLUS
1000.0000 mL | Freq: Once | INTRAVENOUS | Status: AC
Start: 2021-08-15 — End: 2021-08-15
  Administered 2021-08-15: 1000 mL via INTRAVENOUS

## 2021-08-15 MED ORDER — DOCUSATE SODIUM 100 MG PO CAPS: 100.0000 mg | ORAL_CAPSULE | Freq: Two times a day (BID) | ORAL | 0 refills | Status: DC

## 2021-08-15 MED ORDER — ACETAMINOPHEN 80 MG RE SUPP: 500.0000 mg | RECTAL | Status: DC | PRN

## 2021-08-15 MED ORDER — ACETAMINOPHEN 650 MG RE SUPP
650.0000 mg | RECTAL | Status: DC | PRN
  Administered 2021-08-15: 650 mg via RECTAL
  Filled 2021-08-15 (×2): qty 1

## 2021-08-15 MED ORDER — TAPENTADOL HCL 50 MG PO TABS
25.0000 mg | ORAL_TABLET | Freq: Two times a day (BID) | ORAL | Status: DC | PRN
  Administered 2021-08-16 – 2021-08-20 (×4): 25 mg via ORAL
  Filled 2021-08-15 (×4): qty 1

## 2021-08-15 MED ORDER — FOSFOMYCIN TROMETHAMINE 3 G PO PACK
3.0000 g | PACK | Freq: Once | ORAL | Status: DC
  Filled 2021-08-15: qty 3

## 2021-08-15 MED ORDER — TECHNETIUM TO 99M ALBUMIN AGGREGATED
4.4000 | Freq: Once | INTRAVENOUS | Status: AC
  Administered 2021-08-15: 4.4 via INTRAVENOUS

## 2021-08-15 NOTE — Care Management Important Message (Signed)
Important Message ? ?Patient Details IM Letter placed in Patients room. ?Name: Kerri [name] ?MRN: [id number] ?Date of Birth: 08-15-1929 ? ? ?Medicare Important Message Given:  Yes ? ? ? ? ?[name] ?08/15/2021, 10:25 AM ?

## 2021-08-15 NOTE — Significant Event (Addendum)
Rapid Response Event Note  ? ?Reason for Call : Altered Mental Status, drowsy ?Notified by [facility] charge in regards to patient having change in mental status. Patient was alert, oriented, and following commands this morning. Patient was able to get up to bedside toilet with no issues according to staff. Upon arrival, patient appears very fatigued and lethargic. Patient was also shivering as well and warm to the touch. Patient able to respond to a loud voice and painful stimuli. Patient alert and oriented x2, disoriented to time and disoriented to situation.  ? ? ?Initial Focused Assessment:  ?Neuro: Pupils pin point and sluggish but reactive to light, patient able to follow commands with painful stimuli. Patient able to move upper and lower extremities but weak. Post-op day 5 for fixation of Right femur.  ?Temperature: 101.5 F rectal  ?Cardiac: ST, HR 115, s1 and s2 heard upon ausculation, does have murmur over aortic area. According to history, patient has a Carotid Bruit. BP WNL, see vital signs ?Pulmonary: RR 14-15, O2 98%, 2L, no apparent respiratory distress, breath sounds clear in upper lung fields, clear and diminished in lower lung fields bilaterally.  ? ?Interventions:  ?MD Kc, Ramesh at bedside ?-STAT CBC, CMP, Lactic Acid, ABG  ?CBG 135 ?EKG obtained  ?CT of head ordered STAT ?Plan of Care:  ?Continue to monitor on [place] telemetry for now. Follow MEWS protocol. If patient becomes unresponsive, having a significant change in hemodynamic status, or change in vital signs placing patient in RED MEWs, please call Rapid Response at [phone]  ? ?Event Summary:  ? ?MD Notified: MD Antonieta Pert paged by bedside RN  ?Call Time: 1038 ?Arrival Time: 7096 ?End Time: 1130  ? ?Laurence Slate, RN ?

## 2021-08-15 NOTE — Progress Notes (Signed)
?PROGRESS NOTE ?Kerri [name]  WUJ:[id number] DOB: [birth year] DOA: 08/09/2021 ?PCP: Lavone Orn, MD  ? ?Brief Narrative/Hospital Course: ?88 y.o. female with medical history significant of hypertension history of syncope cluster headaches, asthma, CAD, chronic back pain, depression, HLD, GERD, hypothyroidism, irritable bowel syndrome, history of melanoma presented to the ED after a fall that was 5 days PTA a and could not get up.  Per report while going to the shower tripped on her shoes and fell down and hurt her hip back, did not have a fall to call anybody. She is admitted for  Closed 2-part intertrochanteric fracture of right femur, traumatic rhabdomyolysis. Had abnormal ecg and pt could not provide hx,and had small run of NSVT, patient was admitted for orthopedic evaluation, cardiology was consulted for preoperative evaluation.  In the ED CT head no acute finding, but with severe atherosclerotic plaque of the carotid arteries within the neck, chest x-ray no acute finding CT abdomen pelvis no acute finding, CTA chest no evidence of infiltrate. ?Patient was admitted and seen by cardiology in preop evaluation underwent echocardiogram and carotid duplex.  Underwent IM nailing of the right femur postop course relatively stable but with verbal aggression, psych was consulted.  She also had acute blood loss anemia in the setting of hip fracture, also treated for UTI.  Continued on pain management IV antibiotics. ?Plan is for skilled nursing facility once cleared by orthopedics and urine culture resulted.  Urine culture grew ESBL, patient was managed with meropenem, sensitive to nitrofurantoin.  Hemoglobin now is uptrending and 9.1 g, she is stable for discharge to skilled nursing facility.  ?  ?Subjective: ?Seen and examined this morning and again urgently for acute change in mental status ?Patient was minimally responsive initially with rigid posture but was able to answer some questions and squeeze bilateral upper  extremities and wiggle her toes during the episode no focal weakness no postictal. ?Seen at the bedside and also neurology was present at the bedside subsequently ? ?Assessment and Plan: ?Principal Problem: ?  Hip fracture ([place]) ?Active Problems: ?    Hypokalemia ?  Elevated CK ?  Elevated troponin ?  UTI (urinary tract infection) ?  Hypothyroidism ?  Hyperlipidemia ?  Essential hypertension ?  Coronary atherosclerosis ?  Carotid bruit ?  Dehydration ?  NSVT (nonsustained ventricular tachycardia) ?  Normocytic anemia ?  Protein-calorie malnutrition, severe ?  Aggression ?  ?Closed 2 part intertrochanteric fracture of the right femur status post mechanical fall: Seen by cardiology and preop underwent echocardiogram and carotid duplex. S/P IM fixation Rt femur by Dr. Lyla Glassing 3/22/23Ortho continuing on Aspirin 325 daily, Robaxin, Nucynta (after discussion with the daughter cutting down to 25 mg)  cont stool softener and MiraLAX ? ?Leukocytosis ?ESBL E COLI UTI ?Febrile episode: ?rectal temperature was high this morning during the episode of confusion: Chest x-ray no acute finding recheck UA continue on meropenem for now.  Also sending blood cultures. ?Recent Labs  ?Lab 08/09/21 ?2300 08/10/21 ?9562 08/10/21 ?1308 08/11/21 ?6578 08/12/21 ?4696 08/13/21 ?2952 08/14/21 ?8413 08/15/21 ?1111  ?WBC  --   --    < > 8.4 8.0 6.6 8.6 13.1*  ?LATICACIDVEN 1.1 1.4  --   --   --   --   --  2.9*  ? < > = values in this interval not displayed.  ?  ?Episode of unresponsiveness/altered mental status ?Acute metabolic encephalopathy: ?Likely due to patient's fever dehydration lactic acidosis and also UTI.  Seen by neurology nonfocal on exam.  Deferring CT angio neck seen [place] telemetry status subsequently improved, obtaining MRI brain per neurology.  CT head code stroke without contrast no acute finding.  Minimize opiates continue PT OT ? ?AKI ?Lactic acidosis ?Dehydration: ?Creatinine has worsened, also with elevated lactic acid will  take has been marginal it appears.  Will decrease pain medication unfortunately she is on chronic opiates.  Bolus 1 L then keep on gentle IV fluids, repeat renal function in a.m. repeat/trend lactic acid level.  Encourage oral intake ? ?Traumatic rhabdomyolysis: s/p ivf. resolved ?Dehydration: Resolved.Encourage p.o.  ?Hypokalemia:resolved ? ?Normocytic anemia ?Folate deficiency anemia ?Acute blood loss anemia ?hb in January was 12.2 g but before that in May/25/21- 8.9 g.  Improved to 9.1 g.  Continue folic acid supplementation and iron supplementation.  Check CBC in 1 week.  anemia s 2/2 Acute blood loss due to hip fracture and hemodilution from IV fluids.Anemia panel suggestive of low folic acid with a stable B12 and iron . ?Recent Labs  ?Lab 08/11/21 ?0439 08/12/21 ?0427 08/13/21 ?4270 08/14/21 ?6237 08/15/21 ?1111  ?HGB 9.2* 8.3* 8.1* 9.1* 9.7*  ?HCT 29.2* 26.4* 25.3* 27.8* 30.7*  ? ? ?Acute hypoxic respiratory failure patient needing 2 L nasal cannula.  Chest x-ray unremarkable I suspect in the setting of less immobility opioid use not taking deep breath.  Unable to get CTA due to patient's AKI obtain perfusion scan continue supplemental oxygen. ?  ?Abnormal EKG-with ST depression diffuse ?NSVT  ?CAD ?HLD: ?Mildly elevated troponin 141-152: Likely demand ischemia.  Troponin 3/20 in a.m. normal at 10. ?Cardiology has seen preop.Appreciate input denies chest pain.  Echo EF more than 70%, no RWMA, has G1 DD.  Stable.  On Crestor. ? ?Hypertension on chlorthalidone at home.BP is well controlled diuretics on hold.  Giving fluids due to AKI ?Atherosclerotic plaque in the carotid arteries in the CT scan incidentally noted, carotid duplex no acute finding. ? ?Hypothyroidism:on synthroid.Euthyroid with TSH 1.4 ? ?DVT prophylaxis: SCDs Start: 08/11/21 0121 ASA 325 mg per ortho ?Code Status:   Code Status: Full Code ?Family Communication: plan of care discussed with patient.Discussed with patient's daughter ?Patient status  is: Inpatient level of care: Telemetry  ?Remains inpatient because: Ongoing management of post  hip fracture ?Patient currently not stable ? ?Dispo: The patient is from:Home ?           Anticipated disposition: SNF once medically stable.  ? ?Mobility Assessment (last 72 hours)   ? ? Mobility Assessment   ? ? [employer] Name 08/14/21 [phone] 08/14/21 0802 08/13/21 2130 08/13/21 1459 08/13/21 0815  ? Does patient have an order for bedrest or is patient medically unstable Yes- Bedfast (Level 1) - Complete Yes- Bedfast (Level 1) - Complete No - Continue assessment -- No - Continue assessment  ? What is the highest level of mobility based on the progressive mobility assessment? Level 1 (Bedfast) - Unable to balance while sitting on edge of bed Level 1 (Bedfast) - Unable to balance while sitting on edge of bed Level 1 (Bedfast) - Unable to balance while sitting on edge of bed Level 2 (Chairfast) - Balance while sitting on edge of bed and cannot stand Level 2 (Chairfast) - Balance while sitting on edge of bed and cannot stand  ? Is the above level different from baseline mobility prior to current illness? -- Yes - Recommend PT order Yes - Recommend PT order -- Yes - Recommend PT order  ? ? [place] Name 08/12/21 2000  ?  ?  ?  ?  ?  Does patient have an order for bedrest or is patient medically unstable No - Continue assessment      ? What is the highest level of mobility based on the progressive mobility assessment? Level 2 (Chairfast) - Balance while sitting on edge of bed and cannot stand      ? Is the above level different from baseline mobility prior to current illness? Yes - Recommend PT order      ? ?  ?  ? ?  ?  ? ?Objective: ?Vitals last 24 hrs: ?Vitals:  ? 08/14/21 1421 08/14/21 2038 08/15/21 0407 08/15/21 1245  ?BP: 138/72 130/62 120/72 129/69  ?Pulse: 86 (!) 101 94 91  ?Resp:  '20 16 20  '$ ?Temp: 98.2 ?F (36.8 ?C) 99.9 ?F (37.7 ?C) 98.6 ?F (37 ?C) 97.9 ?F (36.6 ?C)  ?TempSrc: Oral Oral Oral Oral  ?SpO2: 99% 98% 98% 100%  ?Weight:       ?Height:      ? ?Weight change:  ? ?Physical Examination: ?General exam: AA0X3,older than stated age, weak appearing. ?HEENT:Oral mucosa moist, Ear/Nose WNL grossly, dentition normal. ?Respiratory

## 2021-08-15 NOTE — Consult Note (Signed)
[facility] Nurse wound follow up ?Daughter present for my assessment ?Wound type: Pressure injury, DTPI in evolution sustained from fall and found down for 3+ days ?Measurement:5cm x 4cm x 0 cm ?Wound bed: central area of white discoloration, periphery is red, moist ?Drainage (amount, consistency, odor) small serous ?Periwound: intact ?Dressing procedure/placement/frequency: Continue with current POC i.e., silicone foam dressing for sacrum, turning and repositioning, PurWick for urinary incontinence. Heels are being floated. ? ?[place] nursing team will not follow, but will remain available to this patient, the nursing and medical teams.  Please re-consult if needed. ?Thanks, ?Maudie Flakes, MSN, RN, [place], [place], CWON-AP, [place]  ?Pager# [phone]  ? ?  ?

## 2021-08-15 NOTE — Consult Note (Signed)
Neurology Consultation ?Reason for Consult: Altered mental status ?Requesting Physician: Antonieta Pert ? ?CC: Altered mental status ? ?History is obtained from: Chart review, nursing staff at bedside, limited from patient due to AMS ? ?HPI: Kerri [name] is a 88 y.o. female with a past medical history significant for hypertension, hyperlipidemia, migraine headaches, hypothyroidism, coronary artery disease, fatty liver, metastatic melanoma of the left leg s/p multiple excisions, asthma, chronic pain on chronic opiates. ? ?She had a mechanical fall at home in the shower and was unable to reach her phone, presenting to the hospital 5 days after her initial injury with rhabdomyolysis.  She is status post right hip fracture repair and was pending discharge but acutely became altered today.  She was last seen well around 9:15 AM, ambulatory with assistance, able to converse normally (intermittently irritable at this hospitalization), applying her make-up etc. at 9:30 AM she was found to be staring straight ahead, barely verbal, shivering, fever of 101.5, blood pressure 129/71, heart rate 132.  Rapid response was called and they noted no focal deficits.  Neurology was consulted for further recommendations ? ? ?LKW: 9:15 AM, discovery at 9:30 AM ?tPA given?: No, no focal deficits and symptoms rapidly improving ?Premorbid modified rankin scale:  ?    0 - No symptoms. ? ?ROS: Unable to obtain due to altered mental status.  ? ?Past Medical History:  ?Diagnosis Date  ? Adenomatous colon polyp   ? Asthma   ? Basal cell carcinoma 03/13/2017  ? left forehead - CX3 + cautery + 5FU  ? CAD (coronary artery disease)   ? Chest CTA 10/07: 40% or less pLAD;  ETT 8/09: negative  ? Carotid bruit   ? Chronic back pain   ? Depression   ? Diverticulosis   ? DJD (degenerative joint disease)   ? Dyslipidemia   ? Dysrhythmia   ? pvcs  ? Fatty liver   ? GERD (gastroesophageal reflux disease)   ? HTN (hypertension)   ? Hypothyroidism   ? IBS (irritable  bowel syndrome)   ? Internal hemorrhoid   ? Melanoma ([place])   ? metastatic; left leg; s/p multiple excisions  ? Migraine   ? Osteoporosis   ? Spinal stenosis   ? Squamous cell carcinoma of skin 09/02/2015  ? mid chest - CX3 + 5FU  ? Squamous cell carcinoma of skin 11/07/2017  ? left wrist - tx p bx  ? Squamous cell carcinoma of skin 12/11/2017  ? left hand - tx p bx  ? ? ?Current Facility-Administered Medications:  ?  acetaminophen (TYLENOL) suppository 650 mg, 650 mg, Rectal, Q4H PRN, Kc, Ramesh, MD, 650 mg at 08/15/21 1110 ?  aspirin EC tablet 325 mg, 325 mg, Oral, Q breakfast, Swinteck, Aaron Edelman, MD, 325 mg at 08/15/21 0844 ?  calcium carbonate (OS-CAL - dosed in mg of elemental calcium) tablet 1,250 mg, 1,250 mg, Oral, Daily, Kc, Ramesh, MD, 1,250 mg at 08/14/21 1040 ?  cholecalciferol (VITAMIN D) tablet 2,000 Units, 2,000 Units, Oral, Daily, Kc, Ramesh, MD, 2,000 Units at 08/14/21 1041 ?  docusate sodium (COLACE) capsule 100 mg, 100 mg, Oral, BID, Swinteck, Aaron Edelman, MD, 100 mg at 08/14/21 2140 ?  feeding supplement (BOOST / RESOURCE BREEZE) liquid 1 Container, 1 Container, Oral, TID BM, Antonieta Pert, MD, 1 Container at 08/14/21 1042 ?  ferrous sulfate tablet 325 mg, 325 mg, Oral, Q breakfast, Kc, Ramesh, MD, 325 mg at 08/15/21 0844 ?  folic acid (FOLVITE) tablet 1 mg, 1 mg, Oral, Daily,  Karren Cobble, [place], 1 mg at 08/14/21 1042 ?  fosfomycin (MONUROL) packet 3 g, 3 g, Oral, Once, Kc, Ramesh, MD ?  gabapentin (NEURONTIN) capsule 300 mg, 300 mg, Oral, TID, Swinteck, Aaron Edelman, MD, 300 mg at 08/14/21 2140 ?  HYDROmorphone (DILAUDID) injection 0.5 mg, 0.5 mg, Intravenous, Q4H PRN, Rod Can, MD, 0.5 mg at 08/13/21 1209 ?  HYDROmorphone (DILAUDID) tablet 1 mg, 1 mg, Oral, Q4H PRN, Rod Can, MD, 1 mg at 08/13/21 2002 ?  hydrOXYzine (ATARAX) tablet 10 mg, 10 mg, Oral, TID PRN, Antonieta Pert, MD, 10 mg at 08/14/21 0004 ?  levothyroxine (SYNTHROID) tablet 75 mcg, 75 mcg, Oral, Q0600, Rod Can, MD, 75 mcg at  08/15/21 0555 ?  menthol-cetylpyridinium (CEPACOL) lozenge 3 mg, 1 lozenge, Oral, PRN **OR** phenol (CHLORASEPTIC) mouth spray 1 spray, 1 spray, Mouth/Throat, PRN, Swinteck, Aaron Edelman, MD ?  methocarbamol (ROBAXIN) tablet 500 mg, 500 mg, Oral, Q6H PRN, 500 mg at 08/14/21 2140 **OR** methocarbamol (ROBAXIN) 500 mg in dextrose 5 % 50 mL IVPB, 500 mg, Intravenous, Q6H PRN, Rod Can, MD, Last Rate: 100 mL/hr at 08/13/21 2133, 500 mg at 08/13/21 2133 ?  metoCLOPramide (REGLAN) tablet 5-10 mg, 5-10 mg, Oral, Q8H PRN **OR** metoCLOPramide (REGLAN) injection 5-10 mg, 5-10 mg, Intravenous, Q8H PRN, Swinteck, Aaron Edelman, MD ?  multivitamin with minerals tablet 1 tablet, 1 tablet, Oral, Daily, Kc, Ramesh, MD, 1 tablet at 08/14/21 1041 ?  nutrition supplement (JUVEN) (JUVEN) powder packet 1 packet, 1 packet, Oral, BID BM, Antonieta Pert, MD, 1 packet at 08/14/21 1043 ?  OLANZapine zydis (ZYPREXA) disintegrating tablet 2.5 mg, 2.5 mg, Oral, Daily PRN, Kc, Ramesh, MD ?  ondansetron (ZOFRAN) tablet 4 mg, 4 mg, Oral, Q6H PRN **OR** ondansetron (ZOFRAN) injection 4 mg, 4 mg, Intravenous, Q6H PRN, Swinteck, Aaron Edelman, MD ?  polyethylene glycol (MIRALAX / GLYCOLAX) packet 17 g, 17 g, Oral, Daily PRN, Swinteck, Aaron Edelman, MD ?  potassium chloride (KLOR-CON) packet 20 mEq, 20 mEq, Oral, Daily, Kc, Ramesh, MD, 20 mEq at 08/14/21 1043 ?  rosuvastatin (CRESTOR) tablet 10 mg, 10 mg, Oral, QHS, Kc, Ramesh, MD, 10 mg at 08/14/21 2140 ?  senna (SENOKOT) tablet 8.6 mg, 1 tablet, Oral, BID, Swinteck, Aaron Edelman, MD, 8.6 mg at 08/14/21 2140 ?  tapentadol (NUCYNTA) tablet 50 mg, 50 mg, Oral, Q8H PRN, Kc, Ramesh, MD, 50 mg at 08/14/21 2140 ?  vitamin B-12 (CYANOCOBALAMIN) tablet 500 mcg, 500 mcg, Oral, Daily, Kc, Ramesh, MD, 500 mcg at 08/14/21 1042 ? ?Current Outpatient Medications  ?Medication Instructions  ? acetaminophen (TYLENOL) 500 mg, Oral, 3 times daily  ? alendronate (FOSAMAX) 70 mg, Oral, Every Sun  ? aspirin 325 mg, Oral, Daily  ? Biotin 5 mg, Oral, Daily   ? calcium carbonate (OS-CAL) 1250 (500 Ca) MG chewable tablet 2 tablets, Oral, Daily  ? chlorthalidone (HYGROTON) 25 mg, Oral, Daily  ? docusate sodium (COLACE) 100 mg, Oral, 2 times daily  ? erythromycin ophthalmic ointment 1 application., Both Eyes, Daily  ? estradiol (ESTRACE) 0.1 MG/GM vaginal cream 1 Applicatorful, Vaginal, Every other day  ? Eyelid Cleansers (AVENOVA) 0.01 % SOLN Topical  ? ferrous sulfate 325 mg, Oral, Daily with breakfast  ? Flurandrenolide (CORDRAN) 4 MCG/SQCM TAPE 1 each, Apply externally, Daily  ? fluticasone (FLONASE) 50 MCG/ACT nasal spray 2 sprays, Each Nare, BH-each morning  ? folic acid (FOLVITE) 1 mg, Oral, Daily  ? gabapentin (NEURONTIN) 300 mg, Oral, 3 times daily  ? HYDROmorphone (DILAUDID) 1 mg, Oral, Every 4 hours PRN  ?  levothyroxine (SYNTHROID) 75 mcg, Oral, Daily with breakfast  ? loratadine (CLARITIN) 10 mg, Oral, Daily  ? nutrition supplement, JUVEN, (JUVEN) PACK 1 packet, Oral, 2 times daily between meals  ? polyethylene glycol powder (GLYCOLAX/MIRALAX) 8.5 g, Oral, Daily  ? potassium chloride SA (KLOR-CON) 10 MEQ tablet 10 mEq, Oral, Daily  ? Probiotic Product (PROBIOTIC PO) 1 capsule, Oral, Daily  ? rosuvastatin (CRESTOR) 10 mg, Oral, Daily at bedtime  ? senna (SENOKOT) 8.6 mg, Oral, 2 times daily  ? tapentadol (NUCYNTA) 50 mg, Oral, Every 8 hours PRN  ? tretinoin (RETIN-A) 1.61 % cream 1 application., Topical, Every other day  ? tretinoin (RETIN-A) 0.1 % cream Topical, Daily at bedtime  ? trimethoprim (TRIMPEX) 100 mg, Oral, Daily, UTI prevention  ? vitamin B-12 (CYANOCOBALAMIN) 500 mcg, Oral, Daily  ? Vitamin D3 2,000 Units, Oral, Daily  ? ? ? ?Family History  ?Problem Relation Age of Onset  ? CAD Mother   ? CAD Father   ? Kidney disease Child   ? ? ?Social History:  reports that she quit smoking about 73 years ago. Her smoking use included cigarettes. She has never used smokeless tobacco. She reports current alcohol use of about 2.0 standard drinks per week. She  reports that she does not use drugs. ? ? ?Exam: ?Current vital signs: ?BP 120/72 (BP Location: Right Arm)   Pulse 94   Temp 98.6 ?F (37 ?C) (Oral)   Resp 16   Ht 5' 5.5" (1.664 m)   Wt 58.8 kg   SpO2 98%

## 2021-08-16 ENCOUNTER — Inpatient Hospital Stay (HOSPITAL_COMMUNITY): Payer: Medicare Other

## 2021-08-16 DIAGNOSIS — S72001A Fracture of unspecified part of neck of right femur, initial encounter for closed fracture: Secondary | ICD-10-CM | POA: Diagnosis not present

## 2021-08-16 DIAGNOSIS — I6340 Cerebral infarction due to embolism of unspecified cerebral artery: Secondary | ICD-10-CM | POA: Diagnosis not present

## 2021-08-16 LAB — CBC
HCT: 26.9 % — ABNORMAL LOW (ref 36.0–46.0)
Hemoglobin: 8.2 g/dL — ABNORMAL LOW (ref 12.0–15.0)
MCH: 27.7 pg (ref 26.0–34.0)
MCHC: 30.5 g/dL (ref 30.0–36.0)
MCV: 90.9 fL (ref 80.0–100.0)
Platelets: 217 10*3/uL (ref 150–400)
RBC: 2.96 MIL/uL — ABNORMAL LOW (ref 3.87–5.11)
RDW: 14.9 % (ref 11.5–15.5)
WBC: 8.5 10*3/uL (ref 4.0–10.5)
nRBC: 0 % (ref 0.0–0.2)

## 2021-08-16 LAB — BASIC METABOLIC PANEL
Anion gap: 5 (ref 5–15)
BUN: 25 mg/dL — ABNORMAL HIGH (ref 8–23)
CO2: 26 mmol/L (ref 22–32)
Calcium: 7.6 mg/dL — ABNORMAL LOW (ref 8.9–10.3)
Chloride: 102 mmol/L (ref 98–111)
Creatinine, Ser: 1.58 mg/dL — ABNORMAL HIGH (ref 0.44–1.00)
GFR, Estimated: 31 mL/min — ABNORMAL LOW (ref >60)
Glucose, Bld: 115 mg/dL — ABNORMAL HIGH (ref 70–99)
Potassium: 4.3 mmol/L (ref 3.5–5.1)
Sodium: 133 mmol/L — ABNORMAL LOW (ref 135–145)

## 2021-08-16 MED ORDER — ASPIRIN EC 81 MG PO TBEC
81.0000 mg | DELAYED_RELEASE_TABLET | Freq: Every day | ORAL | Status: DC
  Administered 2021-08-17 – 2021-08-21 (×5): 81 mg via ORAL
  Filled 2021-08-16 (×5): qty 1

## 2021-08-16 MED ORDER — ASPIRIN 81 MG PO TBEC: 81.0000 mg | DELAYED_RELEASE_TABLET | Freq: Every day | ORAL | 11 refills | Status: DC

## 2021-08-16 MED ORDER — MELATONIN 3 MG PO TABS
3.0000 mg | ORAL_TABLET | Freq: Once | ORAL | Status: AC
  Administered 2021-08-16: 3 mg via ORAL
  Filled 2021-08-16: qty 1

## 2021-08-16 MED ORDER — CLOPIDOGREL BISULFATE 75 MG PO TABS
75.0000 mg | ORAL_TABLET | Freq: Every day | ORAL | Status: DC
  Administered 2021-08-17 – 2021-08-21 (×5): 75 mg via ORAL
  Filled 2021-08-16 (×5): qty 1

## 2021-08-16 MED ORDER — CLOPIDOGREL BISULFATE 75 MG PO TABS
300.0000 mg | ORAL_TABLET | Freq: Once | ORAL | Status: AC
  Administered 2021-08-16: 300 mg via ORAL
  Filled 2021-08-16: qty 4

## 2021-08-16 MED ORDER — CLOPIDOGREL BISULFATE 75 MG PO TABS: 75.0000 mg | ORAL_TABLET | Freq: Every day | ORAL | Status: AC

## 2021-08-16 MED ORDER — MEGESTROL ACETATE 40 MG PO TABS
40.0000 mg | ORAL_TABLET | Freq: Every day | ORAL | Status: DC
Start: 2021-08-17 — End: 2021-08-17
  Administered 2021-08-17: 40 mg via ORAL
  Filled 2021-08-16: qty 1

## 2021-08-16 MED ORDER — ACETAMINOPHEN 325 MG PO TABS
650.0000 mg | ORAL_TABLET | Freq: Once | ORAL | Status: AC | PRN
  Administered 2021-08-16: 650 mg via ORAL
  Filled 2021-08-16: qty 2

## 2021-08-16 MED ORDER — GABAPENTIN 100 MG PO CAPS
100.0000 mg | ORAL_CAPSULE | Freq: Three times a day (TID) | ORAL | Status: DC
  Administered 2021-08-16 – 2021-08-21 (×12): 100 mg via ORAL
  Filled 2021-08-16 (×12): qty 1

## 2021-08-16 NOTE — Progress Notes (Signed)
?PROGRESS NOTE ?Kerri [name]  KKX:[id number] DOB: 1929-09-03 DOA: 08/09/2021 ?PCP: Lavone Orn, MD  ? ?Brief Narrative/Hospital Course: ?88 y.o. female with medical history significant of hypertension history of syncope cluster headaches, asthma, CAD, chronic back pain, depression, HLD, GERD, hypothyroidism, irritable bowel syndrome, history of melanoma presented to the ED after a fall that was 5 days PTA a and could not get up.  Per report while going to the shower tripped on her shoes and fell down and hurt her hip back, did not have a fall to call anybody. She is admitted for  Closed 2-part intertrochanteric fracture of right femur, traumatic rhabdomyolysis. Had abnormal ecg and pt could not provide hx,and had small run of NSVT, patient was admitted for orthopedic evaluation, cardiology was consulted for preoperative evaluation.  In the ED CT head no acute finding, but with severe atherosclerotic plaque of the carotid arteries within the neck, chest x-ray no acute finding CT abdomen pelvis no acute finding, CTA chest no evidence of infiltrate. ?Patient was admitted and seen by cardiology in preop evaluation underwent echocardiogram and carotid duplex.  Underwent IM nailing of the right femur postop course relatively stable but with verbal aggression, psych was consulted.  She also had acute blood loss anemia in the setting of hip fracture, also treated for UTI.  Continued on pain management IV antibiotics. ?Plan is for skilled nursing facility once cleared by orthopedics and urine culture resulted.  Urine culture grew ESBL, patient was managed with meropenem, sensitive to nitrofurantoin.  Hemoglobin now is uptrending and 9.1 g, she is stable for discharge to skilled nursing facility.  ?  ?Subjective: ?Seen examined this am no new complaints ?Overnight no fever,bp stable. ?Labs creat at 1.5, hh 8.2 gm ? ?Assessment and Plan: ?Principal Problem: ?  Hip fracture ([place]) ?Active Problems: ?    Hypokalemia ?  Elevated CK ?   Elevated troponin ?  UTI (urinary tract infection) ?  Hypothyroidism ?  Hyperlipidemia ?  Essential hypertension ?  Coronary atherosclerosis ?  Carotid bruit ?  Dehydration ?  NSVT (nonsustained ventricular tachycardia) ?  Normocytic anemia ?  Protein-calorie malnutrition, severe ?  Aggression ?  AKI (acute kidney injury) ([place]) ?  Acute metabolic encephalopathy ?  Lactic acidosis ?  ?Closed 2 part intertrochanteric fracture of the right femur status post mechanical fall: Seen by cardiology and preop underwent echocardiogram and carotid duplex. S/P IM fixation Rt femur by Dr. Lyla Glassing 3/22/23Ortho continuing on Aspirin 325 daily (will need to change if Plavix started), cont Robaxin, Nucynta (after discussion with the daughter cutting down to 25 mg)  cont stool softener and MiraLAX ? ?Leukocytosis ?ESBL E COLI UTI ?Febrile episode 3/27 am: ?Febrile but it was rectal temp  3/27 am-chest x-ray no acute finding recheck UA w/ pyurea-continue on meropenem for now.  Follow-up urine and blood culture.  No recurrence of fever.  Lactic acidosis/leukocytosis resolved. ?Recent Labs  ?Lab 08/09/21 ?2300 08/10/21 ?1696 08/10/21 ?0450 08/12/21 ?0427 08/13/21 ?7893 08/14/21 ?8101 08/15/21 ?1111 08/15/21 ?1630 08/16/21 ?0409  ?WBC  --   --    < > 8.0 6.6 8.6 13.1*  --  8.5  ?LATICACIDVEN 1.1 1.4  --   --   --   --  2.9* 1.0  --   ? < > = values in this interval not displayed.  ? ?  ?Episode of unresponsiveness/altered mental status ?Acute metabolic encephalopathy-resolved ?Punctate multifocal strokes: ?Episodes of altered mental status.  Febrile episode on 3/20 2 AM neuro work-up completed  that showed punctate multifocal stroke-possibly fat embolus from hip fracture process hypoperfusion versus central embolic source.  Stroke work-up completed per neurology, advised Plavix load 300 followed by 75 MG PLAVIX 21 days alogn with aspirin 81 mg lifelong -Dr. Lyla Glassing okay with the plan.Cont statins. ? ?AKI ?Lactic  acidosis ?Dehydration: ?Likely from poor oral intake multifactorial postop status.  After IV fluids creatinine now downtrending continue IV fluids next 24 hours.  Recheck BMP in AM.  Lactate normalized.   ? ?Traumatic rhabdomyolysis: s/p ivf. resolved ?Dehydration:Resolved.Encourage p.o.  ?Hypokalemia:Resolved ? ?Normocytic anemia ?Folate deficiency anemia ?Acute blood loss anemia ?hb in January was 12.2 g but before that in May/25/21- 8.9 g.  Hemoglobin holding 8.2 to 9 g range.  Monitor ?Suspect Acute blood loss due to hip fracture and hemodilution from IV fluids. ?Anemia panel suggestive of low folic acid with a stable B12 and iron . ?Recent Labs  ?Lab 08/12/21 ?0427 08/13/21 ?8469 08/14/21 ?6295 08/15/21 ?1111 08/16/21 ?0409  ?HGB 8.3* 8.1* 9.1* 9.7* 8.2*  ?HCT 26.4* 25.3* 27.8* 30.7* 26.9*  ? ?Acute hypoxic respiratory failure patient needing 2 L nasal cannula.  Chest x-ray unremarkable I suspect in the setting of less immobility opioid use not taking deep breath.  Unable to get CTA due to patient's AKI obtain perfusion scan low probability for PE.  Wean oxygen as tolerated. ?  ?Abnormal EKG-with ST depression diffuse ?NSVT  ?CAD ?HLD: ?Mildly elevated troponin 141-152: Likely demand ischemia.  Troponin 3/20 in a.m. normal at 10. ?Cardiology has seen preop.Appreciate input denies chest pain.  Echo EF more than 70%, no RWMA, has G1 DD.  Stable.  On Crestor. ? ?Hypertension on chlorthalidone at home.limit due to AKI.   ?Atherosclerotic plaque in the carotid arteries in the CT scan incidentally noted, carotid duplex no acute finding. ?Hypothyroidism:on synthroid.Euthyroid with TSH 1.4 ?Deep TC injury present on the back initially [place]. Wound care consulted. ? ?DVT prophylaxis: SCDs Start: 08/11/21 0121 ASA 325 mg per ortho ?Code Status:   Code Status: Full Code ?Family Communication: plan of care discussed with patient.Discussed with patient's daughter ?Patient status is: Inpatient level of  care: Telemetry  ?Remains inpatient because: Ongoing management of post  hip fracture/ stroke ?Patient currently not stable ? ?Dispo: The patient is from:Home ?           Anticipated disposition: SNF once medically stable ~ 24 hrs if creat improves  ? ?Mobility Assessment (last 72 hours)   ? ? Mobility Assessment   ? ? [place] Name 08/15/21 0900 08/14/21 0855 08/14/21 0802 08/13/21 2130 08/13/21 1459  ? Does patient have an order for bedrest or is patient medically unstable No - Continue assessment Yes- Bedfast (Level 1) - Complete Yes- Bedfast (Level 1) - Complete No - Continue assessment --  ? What is the highest level of mobility based on the progressive mobility assessment? Level 3 (Stands with assist) - Balance while standing  and cannot march in place Level 1 (Bedfast) - Unable to balance while sitting on edge of bed Level 1 (Bedfast) - Unable to balance while sitting on edge of bed Level 1 (Bedfast) - Unable to balance while sitting on edge of bed Level 2 (Chairfast) - Balance while sitting on edge of bed and cannot stand  ? Is the above level different from baseline mobility prior to current illness? Yes - Recommend PT order -- Yes - Recommend PT order Yes - Recommend PT order --  ? ?  ?  ? ?  ?  ? ?  Objective: ?Vitals last 24 hrs: ?Vitals:  ? 08/16/21 0500 08/16/21 0508 08/16/21 0600 08/16/21 0848  ?BP:  124/67  126/64  ?Pulse:  94  (!) 108  ?Resp: '17 18 19 20  '$ ?Temp:  98.4 ?F (36.9 ?C)  98.8 ?F (37.1 ?C)  ?TempSrc:  Oral  Oral  ?SpO2:  100%  98%  ?Weight:      ?Height:      ? ?Weight change:  ? ?Physical Examination: ?General exam: AA0X3,older than stated age, weak appearing. ?HEENT:Oral mucosa moist, Ear/Nose WNL grossly, dentition normal. ?Respiratory system: bilaterally diminished,no use of accessory muscle ?Cardiovascular system: S1 & S2 +, No JVD,. ?Gastrointestinal system: Abdomen soft,NT,ND, BS+ ?Nervous System:Alert, awake, moving extremities and grossly nonfocal ?Extremities: Right hip surgical site  Aquacel dressing in place, edema neg,distal peripheral pulses palpable.  ?Skin: No rashes,no icterus. ?MSK: Normal muscle bulk,tone, power ? ?Medications reviewed:  ?Scheduled Meds: ? aspirin EC  325 mg Oral Q breakfast  ? calc

## 2021-08-16 NOTE — Progress Notes (Addendum)
Neurology Progress Note ? ?Patient ID: Kerri [name] is a 88 y.o. female with a past medical history significant for hypertension, hyperlipidemia, migraine headaches, hypothyroidism, coronary artery disease, fatty liver, metastatic melanoma of the left leg s/p multiple excisions, asthma, chronic pain on chronic opiates. ? ?Initially consulted for: Altered mental status ? ?Major interval events/Subjective: ?-Additional lab work reveals significant AKI, now improving ?-Remains tachycardic, normotensive ?-Fever curve improving ?-Complains of whole body pain this morning, but particularly in the postsurgical right hip with some numbness there as well ? ? ? ? ?Current Facility-Administered Medications:  ?  0.9 %  sodium chloride infusion, , Intravenous, Continuous, Kc, Ramesh, MD, Last Rate: 100 mL/hr at 08/15/21 1624, New Bag at 08/15/21 1624 ?  acetaminophen (TYLENOL) suppository 650 mg, 650 mg, Rectal, Q4H PRN, Kc, Ramesh, MD, 650 mg at 08/15/21 1110 ?  aspirin EC tablet 325 mg, 325 mg, Oral, Q breakfast, Swinteck, Aaron Edelman, MD, 325 mg at 08/16/21 0845 ?  calcium carbonate (OS-CAL - dosed in mg of elemental calcium) tablet 1,250 mg, 1,250 mg, Oral, Daily, Kc, Ramesh, MD, 1,250 mg at 08/16/21 1036 ?  cholecalciferol (VITAMIN D) tablet 2,000 Units, 2,000 Units, Oral, Daily, Kc, Ramesh, MD, 2,000 Units at 08/16/21 1037 ?  docusate sodium (COLACE) capsule 100 mg, 100 mg, Oral, BID, Swinteck, Aaron Edelman, MD, 100 mg at 08/16/21 1036 ?  feeding supplement (BOOST / RESOURCE BREEZE) liquid 1 Container, 1 Container, Oral, TID BM, Antonieta Pert, MD, 1 Container at 08/14/21 1042 ?  ferrous sulfate tablet 325 mg, 325 mg, Oral, Q breakfast, Kc, Ramesh, MD, 325 mg at 08/16/21 0845 ?  folic acid (FOLVITE) tablet 1 mg, 1 mg, Oral, Daily, Karren Cobble, RPH, 1 mg at 08/16/21 1037 ?  gabapentin (NEURONTIN) capsule 300 mg, 300 mg, Oral, TID, Swinteck, Aaron Edelman, MD, 300 mg at 08/16/21 1036 ?  HYDROmorphone (DILAUDID) injection 0.5 mg, 0.5 mg,  Intravenous, Q4H PRN, Rod Can, MD, 0.5 mg at 08/13/21 1209 ?  HYDROmorphone (DILAUDID) tablet 1 mg, 1 mg, Oral, Q4H PRN, Rod Can, MD, 1 mg at 08/13/21 2002 ?  hydrOXYzine (ATARAX) tablet 10 mg, 10 mg, Oral, TID PRN, Antonieta Pert, MD, 10 mg at 08/14/21 0004 ?  levothyroxine (SYNTHROID) tablet 75 mcg, 75 mcg, Oral, Q0600, Rod Can, MD, 75 mcg at 08/16/21 6767 ?  menthol-cetylpyridinium (CEPACOL) lozenge 3 mg, 1 lozenge, Oral, PRN **OR** phenol (CHLORASEPTIC) mouth spray 1 spray, 1 spray, Mouth/Throat, PRN, Swinteck, Aaron Edelman, MD ?  meropenem (MERREM) 1 g in sodium chloride 0.9 % 100 mL IVPB, 1 g, Intravenous, Q12H, Kc, Ramesh, MD, Last Rate: 200 mL/hr at 08/16/21 0623, 1 g at 08/16/21 2094 ?  methocarbamol (ROBAXIN) tablet 500 mg, 500 mg, Oral, Q6H PRN, 500 mg at 08/14/21 2140 **OR** methocarbamol (ROBAXIN) 500 mg in dextrose 5 % 50 mL IVPB, 500 mg, Intravenous, Q6H PRN, Rod Can, MD, Last Rate: 100 mL/hr at 08/13/21 2133, 500 mg at 08/13/21 2133 ?  metoCLOPramide (REGLAN) tablet 5-10 mg, 5-10 mg, Oral, Q8H PRN **OR** metoCLOPramide (REGLAN) injection 5-10 mg, 5-10 mg, Intravenous, Q8H PRN, Swinteck, Aaron Edelman, MD ?  multivitamin with minerals tablet 1 tablet, 1 tablet, Oral, Daily, Kc, Ramesh, MD, 1 tablet at 08/16/21 1036 ?  nutrition supplement (JUVEN) (JUVEN) powder packet 1 packet, 1 packet, Oral, BID BM, Antonieta Pert, MD, 1 packet at 08/14/21 1043 ?  OLANZapine zydis (ZYPREXA) disintegrating tablet 2.5 mg, 2.5 mg, Oral, Daily PRN, Kc, Ramesh, MD ?  ondansetron (ZOFRAN) tablet 4 mg, 4 mg, Oral, Q6H PRN **  OR** ondansetron (ZOFRAN) injection 4 mg, 4 mg, Intravenous, Q6H PRN, Rod Can, MD, 4 mg at 08/16/21 6644 ?  polyethylene glycol (MIRALAX / GLYCOLAX) packet 17 g, 17 g, Oral, Daily PRN, Swinteck, Aaron Edelman, MD ?  potassium chloride (KLOR-CON) packet 20 mEq, 20 mEq, Oral, Daily, Kc, Ramesh, MD, 20 mEq at 08/14/21 1043 ?  rosuvastatin (CRESTOR) tablet 10 mg, 10 mg, Oral, QHS, Kc, Ramesh, MD,  10 mg at 08/15/21 2237 ?  senna (SENOKOT) tablet 8.6 mg, 1 tablet, Oral, BID, Swinteck, Aaron Edelman, MD, 8.6 mg at 08/16/21 1036 ?  tapentadol (NUCYNTA) tablet 25 mg, 25 mg, Oral, Q12H PRN, Kc, Ramesh, MD, 25 mg at 08/16/21 0845 ?  vitamin B-12 (CYANOCOBALAMIN) tablet 500 mcg, 500 mcg, Oral, Daily, Kc, Ramesh, MD, 500 mcg at 08/16/21 1037 ? ? ?Exam: ?Current vital signs: ?BP 126/64 (BP Location: Left Arm)   Pulse (!) 108   Temp 98.8 ?F (37.1 ?C) (Oral)   Resp 20   Ht 5' 5.5" (1.664 m)   Wt 58.8 kg   SpO2 98%   BMI 21.24 kg/m?  ?Vital signs in last 24 hours: ?Temp:  [97.6 ?F (36.4 ?C)-101.5 ?F (38.6 ?C)] 98.8 ?F (37.1 ?C) (03/28 0848) ?Pulse Rate:  [71-108] 108 (03/28 0848) ?Resp:  [14-24] 20 (03/28 0848) ?BP: (124-136)/(64-77) 126/64 (03/28 0848) ?SpO2:  [98 %-100 %] 98 % (03/28 0848) ? ? ?Gen: In bed, comfortable  ?Resp: non-labored breathing, no grossly audible wheezing ?Cardiac: Perfusing extremities well  ?Abd: soft, nt ?Psych: Pleasant and cooperative this morning ? ?Neuro: ?MS: Mildly confused, for example reports the date as September 01, 1921.  Fluent conversation, able to follow simple commands and do simple math (5+2) ?CN: Face with a mild left facial droop, status post multiple face left, EOMI with some saccadic pursuits, pupils equal round and reactive, visual fields full to confrontation ?Motor: No pronator drift.  4+/5 left knee extension, 3/5 right knee extension secondary to pain 5/5 dorsi and plantarflexion in bilateral feet, pain limited hip flexion bilaterally (does not participate) ?Sensory: Some dysesthesia in the swollen right thigh, otherwise intact to light touch throughout ? ? ?Pertinent Labs: ? ? ?Basic Metabolic Panel: ?Recent Labs  ?Lab 08/09/21 ?2300 08/10/21 ?0450 08/11/21 ?0347 08/11/21 ?1446 08/12/21 ?4259 08/14/21 ?5638 08/15/21 ?1111 08/16/21 ?0409  ?NA  --    < > 141  --  138 136 134* 133*  ?K  --    < > 2.9* 4.1 3.7 3.6 4.1 4.3  ?CL  --    < > 105  --  106 100 99 102  ?CO2  --    < >  28  --  '29 29 27 26  '$ ?GLUCOSE  --    < > 168*  --  103* 137* 134* 115*  ?BUN  --    < > 32*  --  20 14 27* 25*  ?CREATININE  --    < > 0.73  --  0.51 0.56 2.04* 1.58*  ?CALCIUM  --    < > 7.9*  --  7.9* 8.4* 8.4* 7.6*  ?MG 2.5*  --   --   --   --   --   --   --   ?PHOS 4.1  --   --   --   --   --   --   --   ? < > = values in this interval not displayed.  ? ? ?CBC: ?Recent Labs  ?Lab 08/09/21 ?1906 08/10/21 ?0450 08/11/21 ?7564  08/12/21 ?0427 08/13/21 ?0865 08/14/21 ?7846 08/15/21 ?1111 08/16/21 ?0409  ?WBC 15.7* 12.9*   < > 8.0 6.6 8.6 13.1* 8.5  ?NEUTROABS 12.4* 10.2*  --   --   --   --   --   --   ?HGB 12.6 10.9*   < > 8.3* 8.1* 9.1* 9.7* 8.2*  ?HCT 38.5 34.4*   < > 26.4* 25.3* 27.8* 30.7* 26.9*  ?MCV 86.3 87.8   < > 89.8 92.0 94.2 88.7 90.9  ?PLT 307 271   < > 217 204 235 250 217  ? < > = values in this interval not displayed.  ? ? ?Coagulation Studies: ?No results for input(s): LABPROT, INR in the last 72 hours.  ? ? ?Lab Results  ?Component Value Date  ? HGBA1C 5.9 (H) 08/15/2021  ? ?Lab Results  ?Component Value Date  ? CHOL 98 08/15/2021  ? HDL 29 (L) 08/15/2021  ? [place] 42 08/15/2021  ? TRIG 136 08/15/2021  ? CHOLHDL 3.4 08/15/2021  ? ?MRI brain personally reviewed, agree with radiology:  ?"1. Three subtle subcentimeter foci of diffusion signal abnormality ?involving the right parietal lobe and cerebellum as above, ?suspicious for small acute to early subacute ischemic infarcts. No ?associated hemorrhage or mass effect. ?2. No other acute intracranial abnormality. ?3. Mild chronic microvascular ischemic disease with small remote ?lacunar infarct at the inferior left basal ganglia." ?  ?ECHO 08/10/2021 ? 1. Left ventricular ejection fraction, by estimation, is >75%. The left  ?ventricle has hyperdynamic function. The left ventricle has no regional  ?wall motion abnormalities. There is mild concentric left ventricular  ?hypertrophy. Left ventricular diastolic  ?parameters are consistent with Grade I  diastolic dysfunction (impaired  ?relaxation).  ? 2. Right ventricular systolic function is normal. The right ventricular  ?size is normal.  ? 3. The mitral valve is grossly normal. No evidence of mitral valve  ?regurgit

## 2021-08-16 NOTE — Progress Notes (Signed)
Physical Therapy Treatment ?Patient Details ?Name: Kerri [name] ?MRN: [id number] ?DOB: 01-27-1930 ?Today's Date: 08/16/2021 ? ? ?History of Present Illness Patient is an 88 year old female admitted on 08/09/21 after fall with intertrochanteric fracture of right femur and traumatic rhabdomyolysis.  Pt is s/p IM nail on 08/10/21. PMH: hypertension history of syncope cluster headaches, asthma, CAD, chronic back pain, depression, HLD, GERD, hypothyroidism, irritable bowel syndrome, history of melanoma ? ?  ?PT Comments  ? ? The patient was participatory in mobility, requiring 2 max assistance. Stood  at [employer] x 3 with 2 persons assisting. Continue  PT for mobility./   ?Recommendations for follow up therapy are one component of a multi-disciplinary discharge planning process, led by the attending physician.  Recommendations may be updated based on patient status, additional functional criteria and insurance authorization. ? ?Follow Up Recommendations ? Skilled nursing-short term rehab (<3 hours/day) ?  ?  ?Assistance Recommended at Discharge Frequent or constant Supervision/Assistance  ?Patient can return home with the following Two people to help with walking and/or transfers;Two people to help with bathing/dressing/bathroom;Assistance with cooking/housework;Assistance with feeding;Direct supervision/assist for medications management;Assist for transportation;Help with stairs or ramp for entrance ?  ?Equipment Recommendations ? Other (comment)  ?  ?Recommendations for Other Services   ? ? ?  ?Precautions / Restrictions Precautions ?Precautions: Fall ?Restrictions ?RLE Weight Bearing: Weight bearing as tolerated  ?  ? ?Mobility ? Bed Mobility ?  ?Bed Mobility: Supine to Sit ?  ?  ?Supine to sit: +2 for physical assistance, +2 for safety/equipment, Max assist, HOB elevated ?  ?  ?General bed mobility comments: HOB raised, multimodal cues  for use of bed rails, max assistance with legs and to sit upright. ?  ? ?Transfers ?Overall  transfer level: Needs assistance ?  ?Transfers: Sit to/from Stand ?Sit to Stand: Max assist, +2 physical assistance, +2 safety/equipment, From elevated surface ?  ?  ?  ?  ?  ?General transfer comment: Assisted to stand x 2 at [facility]. Able to transfer to recliner and and stand again with max assistance   to sit down from STEDY ?Transfer via Lift Equipment: [place] ? ?Ambulation/Gait ?  ?  ?  ?  ?  ?  ?  ?  ? ? ?Stairs ?  ?  ?  ?  ?  ? ? ?Wheelchair Mobility ?  ? ?Modified Rankin (Stroke Patients Only) ?  ? ? ?  ?Balance Overall balance assessment: History of Falls, Needs assistance ?Sitting-balance support: Feet supported, Bilateral upper extremity supported ?Sitting balance-Leahy Scale: Fair ?Sitting balance - Comments: Requiring UE support and min- mod A at times; ?Postural control: Posterior lean ?Standing balance support: Bilateral upper extremity supported, During functional activity ?Standing balance-Leahy Scale: Poor ?Standing balance comment: 3 stands into STEDY with assist ?  ?  ?  ?  ?  ?  ?  ?  ?  ?  ?  ?  ? ?  ?Cognition Arousal/Alertness: Awake/alert ?Behavior During Therapy: Anxious ?  ?  ?  ?  ?  ?  ?  ?  ?  ?  ?  ?  ?  ?  ?  ?  ?  ?General Comments: patient participatory in mobility ?  ?  ? ?  ?Exercises   ? ?  ?General Comments   ?  ?  ? ?Pertinent Vitals/Pain Pain Assessment ?Faces Pain Scale: Hurts whole lot ?Pain Location: R hip with mobility ?Pain Descriptors / Indicators: Grimacing, Guarding, Discomfort, Moaning ?Pain Intervention(s): Monitored  during session, Premedicated before session  ? ? ?Home Living   ?  ?  ?  ?  ?  ?  ?  ?  ?  ?   ?  ?Prior Function    ?  ?  ?   ? ?PT Goals (current goals can now be found in the care plan section) Progress towards PT goals: Progressing toward goals ? ?  ?Frequency ? ? ? Min 3X/week ? ? ? ?  ?PT Plan Current plan remains appropriate  ? ? ?Co-evaluation PT/OT/SLP Co-Evaluation/Treatment: Yes ?Reason for Co-Treatment: For patient/therapist safety ?PT goals  addressed during session: Mobility/safety with mobility ?OT goals addressed during session: ADL's and self-care ?  ? ?  ?AM-PAC PT "6 Clicks" Mobility   ?Outcome Measure ? Help needed turning from your back to your side while in a flat bed without using bedrails?: Total ?Help needed moving from lying on your back to sitting on the side of a flat bed without using bedrails?: Total ?Help needed moving to and from a bed to a chair (including a wheelchair)?: Total ?Help needed standing up from a chair using your arms (e.g., wheelchair or bedside chair)?: Total ?Help needed to walk in hospital room?: Total ?Help needed climbing 3-5 steps with a railing? : Total ?6 Click Score: 6 ? ?  ?End of Session Equipment Utilized During Treatment: Gait belt ?Activity Tolerance: Patient tolerated treatment well ?Patient left: in chair;with call bell/phone within reach;with chair alarm set ?Nurse Communication: Need for lift equipment;Mobility status ?  ?  ? ? ?Time: 3151-7616 ?PT Time Calculation (min) (ACUTE ONLY): 27 min ? ?Charges:  $Therapeutic Activity: 8-22 mins          ?          ? ?Tresa Endo PT ?Acute Rehabilitation Services ?Pager [phone] ?Office [phone] ? ? ? ?Kerri [name] ?08/16/2021, 2:20 PM ? ?

## 2021-08-16 NOTE — TOC Progression Note (Addendum)
Transition of Care (TOC) - Progression Note  ? ? ?Patient Details  ?Name: Kerri [name] ?MRN: [id number] ?Date of Birth: [birth year] ? ?Transition of Care (TOC) CM/SW Contact  ?Ross Ludwig, LCSW ?Phone Number: ?08/16/2021, 7:19 PM ? ?Clinical Narrative:    ? ?Patient's insurance authorization for [employer] has expired.  Insurance Josem Kaufmann will have to be restarted once patient is closer to being medically ready for discharge.  CSW to follow up with [facility] to see if bed still available once patient closer to being discharged.  Updated clinicals sent to SNFs. ? ?Expected Discharge Plan: [place] ?Barriers to Discharge: SNF Pending bed offer ? ?Expected Discharge Plan and Services ?Expected Discharge Plan: [place] ?  ?Discharge Planning Services: CM Consult ?Post Acute Care Choice: [place] ?Living arrangements for the past 2 months: Apartment ?Expected Discharge Date: 08/15/21               ?  ?  ?  ?  ?  ?  ?  ?  ?  ?  ? ? ?Social Determinants of Health (SDOH) Interventions ?  ? ?Readmission Risk Interventions ?   ? View : No data to display.  ?  ?  ?  ? ? ?

## 2021-08-16 NOTE — Progress Notes (Signed)
Occupational Therapy Treatment ?Patient Details ?Name: Kerri [name] ?MRN: [id number] ?DOB: September 06, 1929 ?Today's Date: 08/16/2021 ? ? ?History of present illness Patient is an 88 year old female admitted on 08/09/21 after fall with intertrochanteric fracture of right femur and traumatic rhabdomyolysis.  Pt is s/p IM nail on 08/10/21. PMH: hypertension history of syncope cluster headaches, asthma, CAD, chronic back pain, depression, HLD, GERD, hypothyroidism, irritable bowel syndrome, history of melanoma ?  ?OT comments ? Patient was educated on importance of getting out of bed each day to prevent stiffness. Patient verbalized understanding with max A x2 for supine to sit on edge of bed with max A x 2 for attempts at standing with patient noted to have flexed trunk and knees with multimodal cues to engage in more upright posture. Patient would continue to benefit from skilled OT services at this time while admitted and after d/c to address noted deficits in order to improve overall safety and independence in ADLs.  ?  ? ?Recommendations for follow up therapy are one component of a multi-disciplinary discharge planning process, led by the attending physician.  Recommendations may be updated based on patient status, additional functional criteria and insurance authorization. ?   ?Follow Up Recommendations ? Skilled nursing-short term rehab (<3 hours/day)  ?  ?Assistance Recommended at Discharge Frequent or constant Supervision/Assistance  ?Patient can return home with the following ? Two people to help with walking and/or transfers;Two people to help with bathing/dressing/bathroom;Assistance with cooking/housework;Assist for transportation;Help with stairs or ramp for entrance ?  ?Equipment Recommendations ?    ?  ?Recommendations for Other Services   ? ?  ?Precautions / Restrictions Precautions ?Precautions: Fall ?Restrictions ?Weight Bearing Restrictions: Yes ?RLE Weight Bearing: Weight bearing as tolerated  ? ? ?  ? ?Mobility  Bed Mobility ?Overal bed mobility: Needs Assistance ?Bed Mobility: Supine to Sit ?  ?  ?Supine to sit: +2 for physical assistance, +2 for safety/equipment, Max assist, HOB elevated ?  ?  ?General bed mobility comments: HOB raised, multimodal cues  for use of bed rails, max assistance with legs and to sit upright. ?  ? ?Transfers ?  ?  ?  ?  ?  ?  ?  ?  ?  ?  ?  ?  ?Balance   ?  ?  ?  ?  ?  ?  ?  ?  ?  ?  ?  ?  ?  ?  ?  ?  ?  ?  ?   ? ?ADL either performed or assessed with clinical judgement  ? ?ADL Overall ADL's : Needs assistance/impaired ?  ?  ?  ?  ?  ?  ?  ?  ?Upper Body Dressing : Moderate assistance ?Upper Body Dressing Details (indicate cue type and reason): sitting in recliner with pain in hip and back. nurse made aware. ?  ?  ?  ?Toilet Transfer Details (indicate cue type and reason): Patient perform sit to stand twice needing max +2 with patient providing minimal effort. Significantly flexed position. patient unable to come into upright postiion in standing with multiple multimodal education provided to obtain upright positioning. steady was used to transfer from edge of bed to recliner in room with pure wic in place as patient reported incontinence with movement. wic was able to collect urine at this time. ?  ?  ?  ?  ?  ?  ?  ? ?Extremity/Trunk Assessment   ?  ?  ?  ?  ?  ? ?  Vision   ?  ?  ?Perception   ?  ?Praxis   ?  ? ?Cognition Arousal/Alertness: Awake/alert ?Behavior During Therapy: Anxious ?Overall Cognitive Status: Within Functional Limits for tasks assessed ?  ?  ?  ?  ?  ?  ?  ?  ?  ?  ?  ?  ?  ?  ?  ?  ?General Comments: patient participated in session ?  ?  ?   ?Exercises   ? ?  ?Shoulder Instructions   ? ? ?  ?General Comments    ? ? ?Pertinent Vitals/ Pain       Pain Assessment ?Pain Assessment: Faces ?Faces Pain Scale: Hurts whole lot ?Pain Location: R hip with mobility ?Pain Descriptors / Indicators: Grimacing, Guarding, Discomfort, Moaning ?Pain Intervention(s): Monitored during session,  Premedicated before session ? ?Home Living   ?  ?  ?  ?  ?  ?  ?  ?  ?  ?  ?  ?  ?  ?  ?  ?  ?  ?  ? ?  ?Prior Functioning/Environment    ?  ?  ?  ?   ? ?Frequency ? Min 2X/week  ? ? ? ? ?  ?Progress Toward Goals ? ?OT Goals(current goals can now be found in the care plan section) ? Progress towards OT goals: Progressing toward goals ? ?   ?Plan Discharge plan remains appropriate   ? ?Co-evaluation ? ? ? PT/OT/SLP Co-Evaluation/Treatment: Yes ?Reason for Co-Treatment: For patient/therapist safety ?PT goals addressed during session: Mobility/safety with mobility ?OT goals addressed during session: ADL's and self-care ?  ? ?  ?AM-PAC OT "6 Clicks" Daily Activity     ?Outcome Measure ? ? Help from another person eating meals?: A Little ?Help from another person taking care of personal grooming?: A Little ?Help from another person toileting, which includes using toliet, bedpan, or urinal?: Total ?Help from another person bathing (including washing, rinsing, drying)?: A Lot ?Help from another person to put on and taking off regular upper body clothing?: A Lot ?Help from another person to put on and taking off regular lower body clothing?: Total ?6 Click Score: 12 ? ?  ?End of Session   ? ?OT Visit Diagnosis: Unsteadiness on feet (R26.81);Other abnormalities of gait and mobility (R26.89);Muscle weakness (generalized) (M62.81);History of falling (Z91.81) ?  ?Activity Tolerance Patient limited by pain ?  ?Patient Left in chair;with call bell/phone within reach;with chair alarm set ?  ?Nurse Communication Mobility status ?  ? ?   ? ?Time: 2633-3545 ?OT Time Calculation (min): 23 min ? ?Charges: OT General Charges ?$OT Visit: 1 Visit ?OT Treatments ?$Therapeutic Activity: 8-22 mins ? ?Kerri [name] OTR/L, MS ?Acute Rehabilitation Department ?Office# [phone] ?Pager# [phone] ? ? ?Feliz Beam Gelisa Tieken ?08/16/2021, 4:17 PM ?

## 2021-08-17 DIAGNOSIS — S72001A Fracture of unspecified part of neck of right femur, initial encounter for closed fracture: Secondary | ICD-10-CM | POA: Diagnosis not present

## 2021-08-17 LAB — BASIC METABOLIC PANEL
Anion gap: 5 (ref 5–15)
BUN: 24 mg/dL — ABNORMAL HIGH (ref 8–23)
CO2: 26 mmol/L (ref 22–32)
Calcium: 8.1 mg/dL — ABNORMAL LOW (ref 8.9–10.3)
Chloride: 106 mmol/L (ref 98–111)
Creatinine, Ser: 1.43 mg/dL — ABNORMAL HIGH (ref 0.44–1.00)
GFR, Estimated: 35 mL/min — ABNORMAL LOW (ref >60)
Glucose, Bld: 106 mg/dL — ABNORMAL HIGH (ref 70–99)
Potassium: 3.8 mmol/L (ref 3.5–5.1)
Sodium: 137 mmol/L (ref 135–145)

## 2021-08-17 LAB — CBC
HCT: 23.8 % — ABNORMAL LOW (ref 36.0–46.0)
Hemoglobin: 7.4 g/dL — ABNORMAL LOW (ref 12.0–15.0)
MCH: 27.7 pg (ref 26.0–34.0)
MCHC: 31.1 g/dL (ref 30.0–36.0)
MCV: 89.1 fL (ref 80.0–100.0)
Platelets: 192 10*3/uL (ref 150–400)
RBC: 2.67 MIL/uL — ABNORMAL LOW (ref 3.87–5.11)
RDW: 15 % (ref 11.5–15.5)
WBC: 7.9 10*3/uL (ref 4.0–10.5)
nRBC: 0 % (ref 0.0–0.2)

## 2021-08-17 LAB — HEMOGLOBIN AND HEMATOCRIT, BLOOD
HCT: 24.5 % — ABNORMAL LOW (ref 36.0–46.0)
Hemoglobin: 7.7 g/dL — ABNORMAL LOW (ref 12.0–15.0)

## 2021-08-17 MED ORDER — MIRTAZAPINE 15 MG PO TABS
7.5000 mg | ORAL_TABLET | Freq: Every day | ORAL | Status: DC
  Administered 2021-08-17 – 2021-08-20 (×3): 7.5 mg via ORAL
  Filled 2021-08-17 (×3): qty 1

## 2021-08-17 NOTE — Evaluation (Signed)
Clinical/Bedside Swallow Evaluation ?Patient Details  ?Name: Kerri [name] ?MRN: [id number] ?Date of Birth: Jul 04, 1929 ? ?Today's Date: 08/17/2021 ?Time: SLP Start Time (ACUTE ONLY): 1501 SLP Stop Time (ACUTE ONLY): 1528 ?SLP Time Calculation (min) (ACUTE ONLY): 27 min ? ?Past Medical History:  ?Past Medical History:  ?Diagnosis Date  ? Adenomatous colon polyp   ? Asthma   ? Basal cell carcinoma 03/13/2017  ? left forehead - CX3 + cautery + 5FU  ? CAD (coronary artery disease)   ? Chest CTA 10/07: 40% or less pLAD;  ETT 8/09: negative  ? Carotid bruit   ? Chronic back pain   ? Depression   ? Diverticulosis   ? DJD (degenerative joint disease)   ? Dyslipidemia   ? Dysrhythmia   ? pvcs  ? Fatty liver   ? GERD (gastroesophageal reflux disease)   ? HTN (hypertension)   ? Hypothyroidism   ? IBS (irritable bowel syndrome)   ? Internal hemorrhoid   ? Melanoma ([place])   ? metastatic; left leg; s/p multiple excisions  ? Migraine   ? Osteoporosis   ? Spinal stenosis   ? Squamous cell carcinoma of skin 09/02/2015  ? mid chest - CX3 + 5FU  ? Squamous cell carcinoma of skin 11/07/2017  ? left wrist - tx p bx  ? Squamous cell carcinoma of skin 12/11/2017  ? left hand - tx p bx  ? ?Past Surgical History:  ?Past Surgical History:  ?Procedure Laterality Date  ? BACK SURGERY    ? BLEPHAROPLASTY    ? BUNIONECTOMY WITH HAMMERTOE RECONSTRUCTION Right 01/16/2013  ? Procedure: RIGHT Barbie Banner OSTEOTOMY WITH SIMPLE BUNIONECTOMY AND SECOND HAMMER TOE CORRECTION;  Surgeon: Wylene Simmer, MD;  Location: [facility];  Service: Orthopedics;  Laterality: Right;  ? CARPAL TUNNEL RELEASE    ? FEMUR IM NAIL Right 08/10/2021  ? Procedure: INTRAMEDULLARY (IM) NAIL FEMORAL;  Surgeon: Rod Can, MD;  Location: [facility];  Service: Orthopedics;  Laterality: Right;  ? KNEE ARTHROSCOPY Right   ? LUMBAR LAMINECTOMY/DECOMPRESSION MICRODISCECTOMY N/A 11/13/2013  ? Procedure: LUMBAR TWO-THREE,LUMBAR THREE-FOUR,LUMBAR FOUR-FIVE LAMINECTOMY AND FORAMINOTOMY;   Surgeon: Ophelia Charter, MD;  Location: [facility];  Service: Neurosurgery;  Laterality: N/A;  ? MELANOMA EXCISION Left 09/19/2012  ? Procedure: WIDE EXCISION METASTATIC MELANOMA LEFT MEDIAL THIGH;  Surgeon: Zenovia Jarred, MD;  Location: [facility];  Service: General;  Laterality: Left;  ? ROTATOR CUFF REPAIR    ? SHOULDER SURGERY    ? TOE SURGERY    ? TOTAL HIP ARTHROPLASTY  09/04/2011  ? Procedure: TOTAL HIP ARTHROPLASTY;  Surgeon: Gearlean Alf, MD;  Location: [facility];  Service: Orthopedics;  Laterality: Left;  ? TOTAL HIP REVISION Left 11/27/2018  ? Procedure: Left hip femoral revision;  Surgeon: Gaynelle Arabian, MD;  Location: [facility];  Service: Orthopedics;  Laterality: Left;  179mn  ? TOTAL KNEE ARTHROPLASTY Right 10/06/2019  ? Procedure: TOTAL KNEE ARTHROPLASTY;  Surgeon: AGaynelle Arabian MD;  Location: [facility];  Service: Orthopedics;  Laterality: Right;  540m  ? ?HPI:  ?[age]ear old female admitted on 08/09/21 after fall with intertrochanteric fracture of right femur and traumatic rhabdomyolysis down for five days.  Pt is s/p IM nail on 08/10/21. PMH: hypertension history of syncope cluster headaches, asthma, CAD, chronic back pain, depression, HLD, GERD, hypothyroidism, irritable bowel syndrome, history of melanoma to left leg, multiple procedures, chronic opiate use 05/2021 dg esophagus ordered by teoh - not done; small left basal  ganglia lacunar cva, Three subtle subcentimeter foci of diffusion signal abnormality involving the right parietal lobe and cerebellum as above,   suspicious for small acute to early subacute ischemic infarcts. Mri for delirium  ?  ?Assessment / Plan / Recommendation  ?Clinical Impression ? Pt fully alert, sitting upright in bed and focused on task at hand.  She presents with functional oropharyngeal swallow ability based on clinical swallow evaluation when focused on masticating/oral manipulation and not speaking during intake. Swallow with liquids was timely with  clear voice throughout. Pt did not pass 3 ounce [place] due to requiring rest break from dyspnea x2 however no s/s of aspiration.  NO focal CN defictis present and pt denies dysphagia prior to admit.  SHe does endorse ongoing hoarsenss - and occasional eructation noted - which may be concerning for GERD.  Xerostomia signficant  for which liquids aid comfort.  Recommend pt continue reglua/thin diet as tolerated.  Per review of chart, pt was to have an esophagram *per Dr Benjamine Mola* but this was not conducted as she reported she "couldn't understand what they were saying" and she "didn't want to drink all of that".  Defer to MD for indication for esophagram.  No SLP follow up indicated. Using teach back, SLP informed pt to aspiration/dysphagia precautions.  ? ?SLP Visit Diagnosis: Dysphagia, unspecified (R13.10) ?   ?Aspiration Risk ?   Mild with precautions ? ?  ?Diet Recommendation Regular;Thin liquid  ? ?Liquid Administration via: Cup;Straw ?Medication Administration: Whole meds with liquid ?Supervision: Patient able to self feed ?Compensations: Slow rate;Small sips/bites ?Postural Changes: Seated upright at 90 degrees;Remain upright for at least 30 minutes after po intake  ?  ?Other  Recommendations Oral Care Recommendations: Oral care BID   ? ?Recommendations for follow up therapy are one component of a multi-disciplinary discharge planning process, led by the attending physician.  Recommendations may be updated based on patient status, additional functional criteria and insurance authorization. ? ?Follow up Recommendations No SLP follow up  ? ? ?  ?Assistance Recommended at Discharge Frequent or constant Supervision/Assistance  ?Functional Status Assessment Patient has had a recent decline in their functional status and demonstrates the ability to make significant improvements in function in a reasonable and predictable amount of time.  ?Frequency and Duration    ?  ?  ?   ? ?Prognosis    ? ?  ? ?Swallow Study   ?General  Date of Onset: 08/17/21 ?HPI: 88 year old female admitted on 08/09/21 after fall with intertrochanteric fracture of right femur and traumatic rhabdomyolysis down for five days.  Pt is s/p IM nail on 08/10/21. PMH: hypertension history of syncope cluster headaches, asthma, CAD, chronic back pain, depression, HLD, GERD, hypothyroidism, irritable bowel syndrome, history of melanoma to left leg, multiple procedures, chronic opiate use 05/2021 dg esophagus ordered by teoh - not done; small left basal ganglia lacunar cva, Three subtle subcentimeter foci of diffusion signal abnormality involving the right parietal lobe and cerebellum as above,   suspicious for small acute to early subacute ischemic infarcts. Mri for delirium ?Type of Study: Bedside Swallow Evaluation ?Previous Swallow Assessment: pt was to have an esophagram per Dr Benjamine Mola in 05/2021 but reports she did not have it completed ?Temperature Spikes Noted: No ?Respiratory Status: Nasal cannula ?History of Recent Intubation: No ?Behavior/Cognition: Alert;Cooperative;Pleasant mood ?Oral Cavity Assessment: Dry ?Oral Care Completed by SLP: No ?Oral Cavity - Dentition: Dentures, top ?Self-Feeding Abilities: Able to feed self ?Patient Positioning: Upright in bed ?Baseline Vocal  Quality: Hoarse ?Volitional Cough: Other (Comment) (NT) ?Volitional Swallow: Able to elicit  ?  ?Oral/Motor/Sensory Function Overall Oral Motor/Sensory Function: Within functional limits   ?Ice Chips Ice chips: Not tested   ?Thin Liquid Thin Liquid: Within functional limits ?Presentation: Cup;Self Fed;Straw  ?  ?Nectar Thick Nectar Thick Liquid: Not tested   ?Honey Thick Honey Thick Liquid: Not tested   ?Puree Puree: Not tested   ?Solid ? ? ?  Solid: Impaired ?Presentation: Self Fed ?Other Comments: cough observed prior to swallow initiation with cracker retained orally only; pt denies coughing with intake  ? ?  ? ?Macario Golds ?08/17/2021,3:40 PM ? ? ?Kathleen Lime, MS CCC SLP ?Acute Rehab  Services ?Office [phone] ?Pager [phone] ? ?

## 2021-08-17 NOTE — Progress Notes (Signed)
?PROGRESS NOTE ?Kerri [name]  ZJI:[id number] DOB: 03-20-1930 DOA: 08/09/2021 ?PCP: Lavone Orn, MD  ? ?Brief Narrative/Hospital Course: ?88 y.o. female with medical history significant of hypertension history of syncope cluster headaches, asthma, CAD, chronic back pain, depression, HLD, GERD, hypothyroidism, irritable bowel syndrome, history of melanoma presented to the ED after a fall that was 5 days PTA a and could not get up.  Per report while going to the shower tripped on her shoes and fell down and hurt her hip back, did not have a fall to call anybody. She is admitted for  Closed 2-part intertrochanteric fracture of right femur, traumatic rhabdomyolysis. Had abnormal ecg and pt could not provide hx,and had small run of NSVT, patient was admitted for orthopedic evaluation, cardiology was consulted for preoperative evaluation.  In the ED CT head no acute finding, but with severe atherosclerotic plaque of the carotid arteries within the neck, chest x-ray no acute finding CT abdomen pelvis no acute finding, CTA chest no evidence of infiltrate. ?Patient was admitted and seen by cardiology in preop evaluation underwent echocardiogram and carotid duplex.  Underwent IM nailing of the right femur postop course relatively stable but with verbal aggression, psych was consulted.  She also had acute blood loss anemia in the setting of hip fracture, also treated for UTI.  Continued on pain management IV antibiotics. ?Plan is for skilled nursing facility once cleared by orthopedics and urine culture resulted.  Urine culture grew ESBL, patient was managed with meropenem, sensitive to nitrofurantoin. ?3/27-she had episode of fever along with altered mental status/responsiveness seen urgently underwent further work-up neurology consult MRI brain that showed punctate multifocal strokes, AKI and felt to be delirium in the setting of UTI.  Completed stroke work-up after Plavix load placed on aspirin Plavix.  ?  ?Subjective: ?Seen  and examined this morning.  Does not feel ready for discharge ?Overnight afebrile was on room air on my exam ?Labs with improving creatinine hemoglobin downtrending at 7.4 g ? ?Assessment and Plan: ?Principal Problem: ?  Hip fracture ([place]) ?Active Problems: ?    Hypokalemia ?  Elevated CK ?  Elevated troponin ?  UTI (urinary tract infection) ?  Hypothyroidism ?  Hyperlipidemia ?  Essential hypertension ?  Coronary atherosclerosis ?  Carotid bruit ?  Dehydration ?  NSVT (nonsustained ventricular tachycardia) ?  Normocytic anemia ?  Protein-calorie malnutrition, severe ?  Aggression ?  AKI (acute kidney injury) ([place]) ?  Acute metabolic encephalopathy ?  Lactic acidosis ?  ?Closed 2 part intertrochanteric fracture of the right femur status post mechanical fall: Seen by cardiology and preop underwent echocardiogram and carotid duplex. S/P IM fixation Rt femur by Dr. Lyla Glassing 08/10/21.  Was on aspirin 325 for DVT prophylaxis but changed to aspirin/Plavix for stroke see below.  Continue pain management muscle relaxant and stool softener as ordered. ? ?Leukocytosis ?ESBL E COLI UTI ?Febrile episode 3/27 am: ?Febrile but it was rectal temp  3/27 am-chest x-ray no acute finding recheck UA w/ pyurea-continue on meropenem for now-urine culture not available but blood culture no growth so far.remains afebrile and leukocytosis has resolved.  ?Recent Labs  ?Lab 08/13/21 ?1751 08/14/21 ?0258 08/15/21 ?1111 08/15/21 ?1630 08/16/21 ?0409 08/17/21 ?0440  ?WBC 6.6 8.6 13.1*  --  8.5 7.9  ?LATICACIDVEN  --   --  2.9* 1.0  --   --   ?  ?Episode of unresponsiveness/altered mental status ?Acute metabolic encephalopathy-resolved ?Punctate multifocal strokes: ?Episodes of altered mental status Febrile episode on 3/27 AM-  neuro work-up completed that showed punctate multifocal stroke-possibly fat embolus from hip fracture process hypoperfusion versus central embolic source.  Stroke work-up completed per neurology,  s/p Plavix load 300  followed by 75 MG PLAVIX 21 days along with aspirin 81 mg lifelong -Dr. Lyla Glassing okay with the plan.Cont statins. ? ?AKI ?Lactic acidosis ?Dehydration: ?Likely from poor oral intake multifactorial postop status.  After IV fluids creatinine now downtrending-continue on IV fluid hydration, encourage oral intake.  ?Recent Labs  ?Lab 08/12/21 ?4034 08/14/21 ?0509 08/15/21 ?1111 08/16/21 ?0409 08/17/21 ?0440  ?BUN 20 14 27* 25* 24*  ?CREATININE 0.51 0.56 2.04* 1.58* 1.43*  ?  ? ?Traumatic rhabdomyolysis: s/p ivf. resolved ?Dehydration:Resolved.Encourage p.o.  ?Hypokalemia:Resolved ? ?Normocytic anemia ?Folate deficiency anemia ?Acute blood loss anemia ?hb in January was 12.2 g but before that in May/25/21- 8.9 g.  Hemoglobin holding 8.2 to 9 g range.  Monitor ?Suspect Acute blood loss due to hip fracture and hemodilution from IV fluids.  Hemoglobin downtrending will repeat H&H and type and screen this afternoon and will likely need blood transfusion. Anemia panel suggestive of low folic acid with a stable B12 and iron . ?Recent Labs  ?Lab 08/13/21 ?7425 08/14/21 ?9563 08/15/21 ?1111 08/16/21 ?8756 08/17/21 ?0440  ?HGB 8.1* 9.1* 9.7* 8.2* 7.4*  ?HCT 25.3* 27.8* 30.7* 26.9* 23.8*  ? ?Acute hypoxic respiratory failure patient needing 2 L nasal cannula.  Chest x-ray unremarkable I suspect in the setting of less immobility opioid use not taking deep breath.  Unable to get CTA due to patient's AKI obtain perfusion scan low probability for PE.  Wean oxygen as tolerated. ?  ?Abnormal EKG-with ST depression diffuse ?NSVT  ?CAD ?HLD: ?Mildly elevated troponin 141-152: Likely demand ischemia.  Troponin 3/20 in a.m. normal at 10. ?Cardiology has seen preop.Appreciate input denies chest pain.  Echo EF more than 70%, no RWMA, has G1 DD.  Stable.  On Crestor. ? ?Hypertension: BP stable.  On chlorthalidone at home.stopped due to AKI.   ?Atherosclerotic plaque in the carotid arteries in the CT scan incidentally noted, carotid duplex no  acute finding. ?Hypothyroidism:on synthroid.Euthyroid with TSH 1.4 ?Deep tissues injury present on the back initially [place]. Wound care consulted. ?  ?DVT prophylaxis: SCDs Start: 08/11/21 0121 ASA 325 mg per ortho ?Code Status:   Code Status: Full Code ?Family Communication: plan of care discussed with patient.Discussed with patient's daughter ?Patient status is: Inpatient level of care: Telemetry  ?Remains inpatient because: Ongoing management of post  hip fracture/ stroke ?Patient currently not stable ? ?Dispo: The patient is from:Home ?           Anticipated disposition: SNF once medically stable ~ 24 hrs if creat improves and hh stable. ? ?Mobility Assessment (last 72 hours)   ? ? Mobility Assessment   ? ? [place] Name 08/17/21 0900 08/16/21 1642 08/16/21 1616 08/16/21 1417 08/15/21 0900  ? Does patient have an order for bedrest or is patient medically unstable No - Continue assessment No - Continue assessment -- -- No - Continue assessment  ? What is the highest level of mobility based on the progressive mobility assessment? Level 3 (Stands with assist) - Balance while standing  and cannot march in place Level 3 (Stands with assist) - Balance while standing  and cannot march in place Level 3 (Stands with assist) - Balance while standing  and cannot march in place Level 3 (Stands with assist) - Balance while standing  and cannot march in place Level 3 (  Stands with assist) - Balance while standing  and cannot march in place  ? Is the above level different from baseline mobility prior to current illness? -- Yes - Recommend PT order -- -- Yes - Recommend PT order  ? ?  ?  ? ?  ?  ? ?Objective: ?Vitals last 24 hrs: ?Vitals:  ? 08/16/21 0600 08/16/21 0848 08/16/21 2017 08/17/21 0448  ?BP:  126/64 123/64 136/66  ?Pulse:  (!) 108 (!) 103 68  ?Resp: '19 20 18 18  '$ ?Temp:  98.8 ?F (37.1 ?C) 98.9 ?F (37.2 ?C) 98.4 ?F (36.9 ?C)  ?TempSrc:  Oral Oral Oral  ?SpO2:  98% 97% 100%  ?Weight:      ?Height:       ? ?Weight change:  ? ?Physical Examination: ?General exam: AA,older than stated age, weak appearing. ?HEENT:Oral mucosa moist, Ear/Nose WNL grossly, dentition normal. ?Respiratory system: bilaterally clear,no use

## 2021-08-17 NOTE — Progress Notes (Signed)
Took all scheduled AM meds into room and reviewed with patient.  ? ?She declined a few meds and then wanted the potassium powder in 50m water only. Advised instructions state 8oz fluid and she said, "If you don't give it to me how I ask for then I won't take it." ? ?Given potassium powder per pt's request. She refused all AM meds until she could eat. ? ?Daughter [name] who's a nurse came in and spoke with daughter about her breakfast and medications.  ? ?[name] was able to give her mother the medications while nurse visible in room. She took meds whole in applesauce one at a time.  ?

## 2021-08-17 NOTE — TOC Progression Note (Signed)
Transition of Care (TOC) - Progression Note  ? ? ?Patient Details  ?Name: Kerri [name] ?MRN: [id number] ?Date of Birth: [birth year] ? ?Transition of Care (TOC) CM/SW Contact  ?Trish Mage, LCSW ?Phone Number: ?08/17/2021, 2:47 PM ? ?Clinical Narrative:   Went to confirm bed choice with patient after hearing from MD that she may be stable for d/c tomorrow.  At one point, Ms Oneil and daughter had chosen [facility].  She reminded me that she wanted "the best," and she has an offer from a 5 star rated facility, [facility] in [place].  Daughter declined to visit facility, and when patient stated it was too far, daughter reminded her that she wants "the best," and it will be highway driving, so easy to get to.  [facility] has bed tomorrow.  Insurance authorization request initiated. TOC will continue to follow during the course of hospitalization. ? ? ? ? ?Expected Discharge Plan: [place] ?Barriers to Discharge: Other (must enter comment) (insurance auth) ? ?Expected Discharge Plan and Services ?Expected Discharge Plan: [place] ?  ?Discharge Planning Services: CM Consult ?Post Acute Care Choice: [place] ?Living arrangements for the past 2 months: Apartment ?Expected Discharge Date: 08/15/21               ?  ?  ?  ?  ?  ?  ?  ?  ?  ?  ? ? ?Social Determinants of Health (SDOH) Interventions ?  ? ?Readmission Risk Interventions ?   ? View : No data to display.  ?  ?  ?  ? ? ?

## 2021-08-17 NOTE — Progress Notes (Signed)
Nutrition Follow-up ? ?DOCUMENTATION CODES:  ? ?Severe malnutrition in context of chronic illness ? ?INTERVENTION:  ? ?-Boost Breeze po TID, each supplement provides 250 kcal and 9 grams of protein ?  ?-1 packet Juven BID, each packet provides 95 calories, 2.5 grams of protein (collagen) ?  ?-Multivitamin with minerals daily ? ?NUTRITION DIAGNOSIS:  ? ?Severe Malnutrition related to chronic illness as evidenced by severe fat depletion, severe muscle depletion. ? ?GOAL:  ? ?Patient will meet greater than or equal to 90% of their needs ? ?MONITOR:  ? ?PO intake, supplement acceptance, Labs, Weight trends, I & O's, Skin ? ?ASSESSMENT:  ? ?88 y.o. female with medical history significant of hypertension history of syncope cluster headaches, asthma, CAD, chronic back pain, depression, HLD, GERD, hypothyroidism, irritable bowel syndrome, history of melanoma  . Admitted for right hip fracture. ? ?3/22: s/p IM fixation of right femur ? ?Patient currently consuming 50% of meals. Last night only consumed 2 orange sherbets for dinner. ?Hasn't accepted Boost Breeze or Juven since 3/26.  ? ?Admission weight: 129 lbs ?No other weights for this admission. ? ?Medications: OSCAL, Vitamin D, Colace, ferrous sulfate, Folic acid, Remeron, Multivitamin with minerals daily, KLOR-CON, Senokot, Vitamin B-12 ? ? Labs reviewed. ? ?Diet Order:   ?Diet Order   ? ?       ?  Diet - low sodium heart healthy       ?  ?  Diet regular Room service appropriate? Yes; Fluid consistency: Thin  Diet effective now       ?  ? ?  ?  ? ?  ? ? ?EDUCATION NEEDS:  ? ?Not appropriate for education at this time ? ?Skin:  Skin Assessment: Skin Integrity Issues: ?Skin Integrity Issues:: Other (Comment), Incisions ?Incisions: 3/22 right hip ?Other: Per WOC note, deep tissure pressure injury to sacrum ? ?Last BM:  3/27 ? ?Height:  ? ?Ht Readings from Last 1 Encounters:  ?08/10/21 5' 5.5" (1.664 m)  ? ? ?Weight:  ? ?Wt Readings from Last 1 Encounters:  ?08/10/21 58.8  kg  ? ? ?BMI:  Body mass index is 21.24 kg/m?. ? ?Estimated Nutritional Needs:  ? ?Kcal:  1450-1650 ? ?Protein:  70-80g ? ?Fluid:  1.6L/day ? ?Clayton Bibles, MS, RD, LDN ?Inpatient Clinical Dietitian ?Contact information available via Amion ? ?

## 2021-08-18 DIAGNOSIS — S72001A Fracture of unspecified part of neck of right femur, initial encounter for closed fracture: Secondary | ICD-10-CM | POA: Diagnosis not present

## 2021-08-18 LAB — BASIC METABOLIC PANEL
Anion gap: 4 — ABNORMAL LOW (ref 5–15)
BUN: 19 mg/dL (ref 8–23)
CO2: 24 mmol/L (ref 22–32)
Calcium: 7.6 mg/dL — ABNORMAL LOW (ref 8.9–10.3)
Chloride: 108 mmol/L (ref 98–111)
Creatinine, Ser: 1.17 mg/dL — ABNORMAL HIGH (ref 0.44–1.00)
GFR, Estimated: 45 mL/min — ABNORMAL LOW (ref >60)
Glucose, Bld: 104 mg/dL — ABNORMAL HIGH (ref 70–99)
Potassium: 3.6 mmol/L (ref 3.5–5.1)
Sodium: 136 mmol/L (ref 135–145)

## 2021-08-18 LAB — CBC
HCT: 23 % — ABNORMAL LOW (ref 36.0–46.0)
Hemoglobin: 7.3 g/dL — ABNORMAL LOW (ref 12.0–15.0)
MCH: 28.3 pg (ref 26.0–34.0)
MCHC: 31.7 g/dL (ref 30.0–36.0)
MCV: 89.1 fL (ref 80.0–100.0)
Platelets: 242 10*3/uL (ref 150–400)
RBC: 2.58 MIL/uL — ABNORMAL LOW (ref 3.87–5.11)
RDW: 15.2 % (ref 11.5–15.5)
WBC: 9 10*3/uL (ref 4.0–10.5)
nRBC: 0 % (ref 0.0–0.2)

## 2021-08-18 LAB — PREPARE RBC (CROSSMATCH)

## 2021-08-18 MED ORDER — SODIUM CHLORIDE 0.9% IV SOLUTION: Freq: Once | INTRAVENOUS | Status: AC

## 2021-08-18 NOTE — Care Management Important Message (Signed)
Important Message ? ?Patient Details IM Letter given to the Patient. ?Name: Kerri [name] ?MRN: [id number] ?Date of Birth: 1929-11-14 ? ? ?Medicare Important Message Given:  Yes ? ? ? ? ?[name] ?08/18/2021, 10:18 AM ?

## 2021-08-18 NOTE — TOC Progression Note (Signed)
Transition of Care (TOC) - Progression Note  ? ? ?Patient Details  ?Name: Kerri [name] ?MRN: [id number] ?Date of Birth: 09/16/1929 ? ?Transition of Care (TOC) CM/SW Contact  ?Trish Mage, LCSW ?Phone Number: ?08/18/2021, 10:01 AM ? ?Clinical NarrativeEverlene Balls reference #[id number]  5 days 3/30-4/3.  MD is saying Saturday d/c.  Logan at [facility] confirmed they can admit then. Contact her at [phone]. FAX d/c summary to [phone]. TOC will continue to follow during the course of hospitalization. ? ? ? ? ?Expected Discharge Plan: [place] ?Barriers to Discharge: Continued Medical Work up ? ?Expected Discharge Plan and Services ?Expected Discharge Plan: [place] ?  ?Discharge Planning Services: CM Consult ?Post Acute Care Choice: [place] ?Living arrangements for the past 2 months: Apartment ?Expected Discharge Date: 08/15/21               ?  ?  ?  ?  ?  ?  ?  ?  ?  ?  ? ? ?Social Determinants of Health (SDOH) Interventions ?  ? ?Readmission Risk Interventions ?   ? View : No data to display.  ?  ?  ?  ? ? ?

## 2021-08-18 NOTE — Progress Notes (Signed)
Physical Therapy Treatment ?Patient Details ?Name: Mylo Driskill ?MRN: [id number] ?DOB: 1929/08/28 ?Today's Date: 08/18/2021 ? ? ?History of Present Illness Patient is an 88 year old female admitted on 08/09/21 after fall with intertrochanteric fracture of right femur and traumatic rhabdomyolysis.  Pt is s/p IM nail on 08/10/21. PMH: hypertension history of syncope cluster headaches, asthma, CAD, chronic back pain, depression, HLD, GERD, hypothyroidism, irritable bowel syndrome, history of melanoma ? ?  ?PT Comments  ? ? POD # 8 ?General Comments: AxO x 2.5 slightly impaired requiring repeat instructions.  HIGH anxiety.  Very fearful of falling. Daughter [name] present during session.  Assisted OOB was VERY difficult.  General bed mobility comments: Pt required + 2 Max/Total Assist with use of pad to carfeully swival and scoot to EOB.  HIGH anxiety.  Fear of falling.  Posterior leaning due to fear. General transfer comment: first assisted fro bed to [facility] via "Bear Hug" pt incont urine upond standing.  VERY fearful of falling.  HIGH anxiety.  Then assisted off BSC + 2 side by side assist.  Pt unable to push self up due to gripping/fear.  Used + 2 side by side hand held assist as well as axillary "lift" to stand pt partially upright.  Pt resistant due to her anxiety and unable to come to a complete upright stance due to fear.  MAX VC's to attempt to correct.  Place B forearms an B plaftorm walker (Evaw Walker) but pt still pushing backward and voicing "I'm falling".  Unable to take any functional steps forward despite + 2 assist and EVA walker.  Switch BSC with recliner from behind pt and assisted to stand to sit.  Pt gripping walker in fear.  Rec nursing use MaxI Move LIFT to assist her back to bed.Positioned in recliner to comfort and applied ICE. ?Pt was living home alone before she fell.  Pt will need ST Rehab at SNF prior to safely retuning. ?  ?Recommendations for follow up therapy are one component of a multi-disciplinary  discharge planning process, led by the attending physician.  Recommendations may be updated based on patient status, additional functional criteria and insurance authorization. ? ?Follow Up Recommendations ? Skilled nursing-short term rehab (<3 hours/day) ?  ?  ?Assistance Recommended at Discharge    ?Patient can return home with the following Two people to help with walking and/or transfers;Two people to help with bathing/dressing/bathroom;Assistance with cooking/housework;Assistance with feeding;Direct supervision/assist for medications management;Assist for transportation;Help with stairs or ramp for entrance ?  ?Equipment Recommendations ?    ?  ?Recommendations for Other Services   ? ? ?  ?Precautions / Restrictions Precautions ?Precautions: Fall ?Precaution Comments: anxiety ?Restrictions ?Weight Bearing Restrictions: No ?RLE Weight Bearing: Weight bearing as tolerated  ?  ? ?Mobility ? Bed Mobility ?Overal bed mobility: Needs Assistance ?Bed Mobility: Supine to Sit ?Rolling: Max assist, Total assist, +2 for physical assistance, +2 for safety/equipment ?  ?  ?  ?  ?General bed mobility comments: Pt required + 2 Max/Total Assist with use of pad to carfeully swival and scoot to EOB.  HIGH anxiety.  Fear of falling.  Posterior leaning due to fear. ?  ? ?Transfers ?Overall transfer level: Needs assistance ?Equipment used: 2 person hand held assist ?Transfers: Sit to/from Stand ?Sit to Stand: Max assist, Total assist, +2 physical assistance, +2 safety/equipment ?  ?  ?  ?  ?  ?General transfer comment: first assisted fro bed to [facility] via "Bear Hug" pt incont urine upond standing.  VERY fearful of falling.  HIGH anxiety.  Then assisted off BSC + 2 side by side assist.  Pt unable to push self up due to gripping/fear.  Used + 2 side by side hand held assist as well as axillary "lift" to stand pt partially upright.  Pt resistant due to her anxiety and unable to come to a complete upright stance due to fear.  MAX VC's to  attempt to correct.  Place B forearms an B plaftorm walker (Evaw Walker) but pt still pushing backward and voicing "I'm falling".  Unable to take any functional steps forward despite + 2 assist and EVA walker.  Switch BSC with recliner from behind pt and assisted to stand to sit.  Pt gripping walker in fear.  Rec nursing use MaxI Move LIFT to assist her back to bed. ?  ? ?Ambulation/Gait ?  ?  ?  ?  ?Gait velocity: unable at this time ?  ?  ?  ? ? ?Stairs ?  ?  ?  ?  ?  ? ? ?Wheelchair Mobility ?  ? ?Modified Rankin (Stroke Patients Only) ?  ? ? ?  ?Balance   ?  ?  ?  ?  ?  ?  ?  ?  ?  ?  ?  ?  ?  ?  ?  ?  ?  ?  ?  ? ?  ?Cognition Arousal/Alertness: Awake/alert ?Behavior During Therapy: Anxious ?Overall Cognitive Status: Within Functional Limits for tasks assessed ?  ?  ?  ?  ?  ?  ?  ?  ?  ?  ?  ?  ?  ?  ?  ?  ?General Comments: AxO x 2.5 slightly impaired requiring repeat instructions.  HIGH anxiety.  Very fearful of falling. ?  ?  ? ?  ?Exercises   ? ?  ?General Comments   ?  ?  ? ?Pertinent Vitals/Pain Pain Assessment ?Pain Assessment: Faces ?Faces Pain Scale: Hurts whole lot ?Pain Location: R hip with mobility  HIGH anxiety/fear of falling ?Pain Descriptors / Indicators: Grimacing, Guarding, Discomfort, Tender ?Pain Intervention(s): Monitored during session, Premedicated before session, Repositioned, Ice applied  ? ? ?Home Living   ?  ?  ?  ?  ?  ?  ?  ?  ?  ?   ?  ?Prior Function    ?  ?  ?   ? ?PT Goals (current goals can now be found in the care plan section) Progress towards PT goals: Progressing toward goals ? ?  ?Frequency ? ? ?   ? ? ? ?  ?PT Plan Current plan remains appropriate  ? ? ?Co-evaluation   ?  ?  ?  ?  ? ?  ?AM-PAC PT "6 Clicks" Mobility   ?Outcome Measure ? Help needed turning from your back to your side while in a flat bed without using bedrails?: Total ?Help needed moving from lying on your back to sitting on the side of a flat bed without using bedrails?: Total ?Help needed moving to and  from a bed to a chair (including a wheelchair)?: Total ?Help needed standing up from a chair using your arms (e.g., wheelchair or bedside chair)?: Total ?Help needed to walk in hospital room?: Total ?Help needed climbing 3-5 steps with a railing? : Total ?6 Click Score: 6 ? ?  ?End of Session Equipment Utilized During Treatment: Gait belt ?Activity Tolerance: Patient tolerated treatment well ?Patient left: in chair;with call bell/phone within  reach;with chair alarm set ?Nurse Communication: Need for lift equipment;Mobility status ?PT Visit Diagnosis: Muscle weakness (generalized) (M62.81);Difficulty in walking, not elsewhere classified (R26.2) ?  ? ? ?Time: 1420-1450 ?PT Time Calculation (min) (ACUTE ONLY): 30 min ? ?Charges:  $Therapeutic Activity: 23-37 mins          ?          ? ?Rica Koyanagi  PTA ?Acute  Rehabilitation Services ?Pager      [phone] ?Office      [phone] ? ? ?

## 2021-08-18 NOTE — Progress Notes (Signed)
?PROGRESS NOTE ?Kerri [name]  SVX:[id number] DOB: [birth year] DOA: 08/09/2021 ?PCP: Lavone Orn, MD  ? ?Brief Narrative/Hospital Course: ?88 y.o. female with medical history significant of hypertension history of syncope cluster headaches, asthma, CAD, chronic back pain, depression, HLD, GERD, hypothyroidism, irritable bowel syndrome, history of melanoma presented to the ED after a fall that was 5 days PTA a and could not get up.  Per report while going to the shower tripped on her shoes and fell down and hurt her hip back, did not have a fall to call anybody. She is admitted for  Closed 2-part intertrochanteric fracture of right femur, traumatic rhabdomyolysis. Had abnormal ecg and pt could not provide hx,and had small run of NSVT, patient was admitted for orthopedic evaluation, cardiology was consulted for preoperative evaluation.  In the ED CT head no acute finding, but with severe atherosclerotic plaque of the carotid arteries within the neck, chest x-ray no acute finding CT abdomen pelvis no acute finding, CTA chest no evidence of infiltrate. ?Patient was admitted and seen by cardiology in preop evaluation underwent echocardiogram and carotid duplex.  Underwent IM nailing of the right femur postop course relatively stable but with verbal aggression, psych was consulted.  She also had acute blood loss anemia in the setting of hip fracture, also treated for UTI.  Continued on pain management IV antibiotics. ?Plan is for skilled nursing facility once cleared by orthopedics and urine culture resulted.  Urine culture grew ESBL, patient was managed with meropenem, sensitive to nitrofurantoin. ?3/27-she had episode of fever along with altered mental status/responsiveness seen urgently underwent further work-up neurology consult MRI brain that showed punctate multifocal strokes, AKI and felt to be delirium in the setting of UTI.  Completed stroke work-up after Plavix load placed on aspirin Plavix. ?   ?Subjective: ?Seen  and Seman this morning complains of short of breath also symptomatic with weakness, hemoglobin is trending low- agreeable for transfusion  ?Low-grade fever 100.8 last night ? ?Assessment and Plan: ?Principal Problem: ?  Hip fracture ([place]) ?Active Problems: ?    Hypokalemia ?  Elevated CK ?  Elevated troponin ?  UTI (urinary tract infection) ?  Hypothyroidism ?  Hyperlipidemia ?  Essential hypertension ?  Coronary atherosclerosis ?  Carotid bruit ?  Dehydration ?  NSVT (nonsustained ventricular tachycardia) ?  Normocytic anemia ?  Protein-calorie malnutrition, severe ?  Aggression ?  AKI (acute kidney injury) ([place]) ?  Acute metabolic encephalopathy ?  Lactic acidosis ?  ?Closed 2 part intertrochanteric fracture of the right femur status post mechanical fall: Seen by cardiology and preop underwent echocardiogram and carotid duplex. S/P IM fixation Rt femur by Dr. Lyla Glassing 08/10/21.  Was on aspirin 325 for DVT prophylaxis but changed to aspirin/Plavix for stroke see below.  Continue pain management muscle relaxant and stool softener as ordered. ? ?Leukocytosis ?ESBL E COLI UTI ?Febrile episode 3/27 am: ?Febrile but it was rectal temp  3/27 am-chest x-ray no acute finding recheck UA w/ pyurea-continued on meropenem  and completed course 3/29.  Low-grade fever-encourage incentive spirometry.Blood culture negative so far.  ?Recent Labs  ?Lab 08/14/21 ?0509 08/15/21 ?1111 08/15/21 ?1630 08/16/21 ?0409 08/17/21 ?0440 08/18/21 ?2330  ?WBC 8.6 13.1*  --  8.5 7.9 9.0  ?LATICACIDVEN  --  2.9* 1.0  --   --   --   ? ?Episode of unresponsiveness/altered mental status ?Acute metabolic encephalopathy-resolved ?Punctate multifocal strokes: ? S/p neuro work-up -that showed punctate multifocal stroke-possibly fat embolus from hip fracture vs hypoperfusion versus  central embolic source.  Stroke work-up completed per neurology,  s/p Plavix load 300 followed by 75 MG PLAVIX 21 days along with aspirin 81 mg lifelong -Dr. Lyla Glassing okay with  the plan.Cont statins.  See neurology note. ? ?AKI ?Lactic acidosis ?Dehydration: ?Likely from poor oral intake multifactorial postop status.  After IV fluids creatinine now downtrending, improved to 1.1.  We will wean down vf,encourage oral intake.  ?Recent Labs  ?Lab 08/14/21 ?0509 08/15/21 ?1111 08/16/21 ?0409 08/17/21 ?0440 08/18/21 ?3845  ?BUN 14 27* 25* 24* 19  ?CREATININE 0.56 2.04* 1.58* 1.43* 1.17*  ? ?  ?Traumatic rhabdomyolysis: s/p ivf. resolved ?Dehydration:Resolved.Encourage p.o.  ?Hypokalemia:Resolved ? ?Normocytic anemia ?Folate deficiency anemia ?Acute blood loss anemia ?hb in January was 12.2 g but before that in May/25/21- 8.9 g.  Hemoglobin further downtrending 7.3 g this morning, described patient and daughter simply symptomatic with weakness shortness of breath not mobilizing much.  Agrees for 1 unit PRBC transfusion after explaining risks and benefits.  ?Suspect Acute blood loss due to hip fracture and hemodilution from IV fluids. Anemia panel suggestive of low folic acid with a stable B12 and iron . ?Recent Labs  ?Lab 08/15/21 ?1111 08/16/21 ?0409 08/17/21 ?0440 08/17/21 ?1436 08/18/21 ?3646  ?HGB 9.7* 8.2* 7.4* 7.7* 7.3*  ?HCT 30.7* 26.9* 23.8* 24.5* 23.0*  ? ?Acute hypoxic respiratory failure patient needing 2 L [place]-cxr stbale-suspect in the setting of psot op, not takign deep breath and not mobilizing much , cut down opioid use s/p perfusion scan low probability for PE.  Wean oxygen as tolerated. ?  ?Abnormal EKG-with ST depression diffuse ?NSVT  ?CAD ?HLD: ?Mildly elevated troponin 141-152: Likely demand ischemia.  Troponin 3/20 in a.m. normal at 10. ?Cardiology has seen preop.Appreciate input denies chest pain.  Echo EF more than 70%, no RWMA, has G1 DD.  Stable.  On Crestor. ? ?Hypertension: BP stable.  On chlorthalidone at home.stopped due to AKI.   ?Atherosclerotic plaque in the carotid arteries in the CT scan incidentally noted, carotid duplex no acute finding. ?Hypothyroidism:on  synthroid.Euthyroid with TSH 1.4 ?Deep tissues injury  POA from fall, continue wound care appreciate wound care input.  ? ?DVT prophylaxis: SCDs Start: 08/11/21 0121 ASA 325 mg per ortho ?Code Status:   Code Status: Full Code ?Family Communication: plan of care discussed with patient.Discussed with patient's daughter ?Patient status is: Inpatient level of care: Telemetry  ?Remains inpatient because: Ongoing management of post  hip fracture/ stroke ?Patient currently not stable ? ?Dispo: The patient is from:Home ?           Anticipated disposition: SN[facility] over the weekend if hemoglobin stabilizes.  Daughter agreeable for discharge to SNF Saturday if bed available, patient has been somewhat reluctant but will likely agree. ? ?Mobility Assessment (last 72 hours)   ? ? Mobility Assessment   ? ? [place] Name 08/17/21 2233 08/17/21 0900 08/16/21 1642 08/16/21 1616 08/16/21 1417  ? Does patient have an order for bedrest or is patient medically unstable No - Continue assessment No - Continue assessment No - Continue assessment -- --  ? What is the highest level of mobility based on the progressive mobility assessment? Level 3 (Stands with assist) - Balance while standing  and cannot march in place Level 3 (Stands with assist) - Balance while standing  and cannot march in place Level 3 (Stands with assist) - Balance while standing  and cannot march in place Level 3 (Stands with assist) - Balance while standing  and cannot  march in place Level 3 (Stands with assist) - Balance while standing  and cannot march in place  ? Is the above level different from baseline mobility prior to current illness? Yes - Recommend PT order -- Yes - Recommend PT order -- --  ? ?  ?  ? ?  ?  ? ?Objective: ?Vitals last 24 hrs: ?Vitals:  ? 08/17/21 0448 08/17/21 1300 08/17/21 1943 08/18/21 0528  ?BP: 136/66 126/66 134/71 127/69  ?Pulse: 68 76 100 86  ?Resp: '18 18 20 16  '$ ?Temp: 98.4 ?F (36.9 ?C) 98.5 ?F (36.9 ?C) (!) 100.8 ?F (38.2 ?C) 97.8 ?F (36.6  ?C)  ?TempSrc: Oral Oral    ?SpO2: 100% 100% 96% 97%  ?Weight:      ?Height:      ? ?Weight change:  ? ?Physical Examination: ?General exam: AA0x3, elderly, older than stated age, weak appearing. ?HEENT:Oral

## 2021-08-19 DIAGNOSIS — S72001A Fracture of unspecified part of neck of right femur, initial encounter for closed fracture: Secondary | ICD-10-CM | POA: Diagnosis not present

## 2021-08-19 DIAGNOSIS — N179 Acute kidney failure, unspecified: Secondary | ICD-10-CM

## 2021-08-19 LAB — URINALYSIS, ROUTINE W REFLEX MICROSCOPIC
Bacteria, UA: NONE SEEN
Bilirubin Urine: NEGATIVE
Glucose, UA: NEGATIVE mg/dL
Ketones, ur: NEGATIVE mg/dL
Nitrite: NEGATIVE
Protein, ur: NEGATIVE mg/dL
Specific Gravity, Urine: 1.015 (ref 1.005–1.030)
pH: 5 (ref 5.0–8.0)

## 2021-08-19 LAB — BASIC METABOLIC PANEL
Anion gap: 6 (ref 5–15)
BUN: 25 mg/dL — ABNORMAL HIGH (ref 8–23)
CO2: 23 mmol/L (ref 22–32)
Calcium: 7.7 mg/dL — ABNORMAL LOW (ref 8.9–10.3)
Chloride: 107 mmol/L (ref 98–111)
Creatinine, Ser: 1.7 mg/dL — ABNORMAL HIGH (ref 0.44–1.00)
GFR, Estimated: 28 mL/min — ABNORMAL LOW (ref >60)
Glucose, Bld: 98 mg/dL (ref 70–99)
Potassium: 3.7 mmol/L (ref 3.5–5.1)
Sodium: 136 mmol/L (ref 135–145)

## 2021-08-19 LAB — CBC
HCT: 26.3 % — ABNORMAL LOW (ref 36.0–46.0)
Hemoglobin: 8.3 g/dL — ABNORMAL LOW (ref 12.0–15.0)
MCH: 27.9 pg (ref 26.0–34.0)
MCHC: 31.6 g/dL (ref 30.0–36.0)
MCV: 88.6 fL (ref 80.0–100.0)
Platelets: 277 10*3/uL (ref 150–400)
RBC: 2.97 MIL/uL — ABNORMAL LOW (ref 3.87–5.11)
RDW: 15.3 % (ref 11.5–15.5)
WBC: 10.2 10*3/uL (ref 4.0–10.5)
nRBC: 0 % (ref 0.0–0.2)

## 2021-08-19 LAB — TYPE AND SCREEN
ABO/RH(D): A POS
Antibody Screen: NEGATIVE
Unit division: 0

## 2021-08-19 LAB — BPAM RBC
Blood Product Expiration Date: 202304212359
ISSUE DATE / TIME: 202303301351
Unit Type and Rh: 6200

## 2021-08-19 LAB — SODIUM, URINE, RANDOM: Sodium, Ur: 71 mmol/L

## 2021-08-19 NOTE — Progress Notes (Signed)
?PROGRESS NOTE ?Kerri [name]  OIZ:[id number] DOB: February 04, 1930 DOA: 08/09/2021 ?PCP: Lavone Orn, MD  ? ?Brief Narrative/Hospital Course: ?As per prior documentation: "88 y.o. female with medical history significant of hypertension history of syncope cluster headaches, asthma, CAD, chronic back pain, depression, HLD, GERD, hypothyroidism, irritable bowel syndrome, history of melanoma presented to the ED after a fall that was 5 days PTA a and could not get up.  Per report while going to the shower tripped on her shoes and fell down and hurt her hip back, did not have a fall to call anybody. She is admitted for  Closed 2-part intertrochanteric fracture of right femur, traumatic rhabdomyolysis. Had abnormal ecg and pt could not provide hx,and had small run of NSVT, patient was admitted for orthopedic evaluation, cardiology was consulted for preoperative evaluation.  In the ED CT head no acute finding, but with severe atherosclerotic plaque of the carotid arteries within the neck, chest x-ray no acute finding CT abdomen pelvis no acute finding, CTA chest no evidence of infiltrate. ?Patient was admitted and seen by cardiology in preop evaluation underwent echocardiogram and carotid duplex.  Underwent IM nailing of the right femur postop course relatively stable but with verbal aggression, psych was consulted.  She also had acute blood loss anemia in the setting of hip fracture, also treated for UTI.  Continued on pain management IV antibiotics. ?Plan is for skilled nursing facility once cleared by orthopedics and urine culture resulted.  Urine culture grew ESBL, patient was managed with meropenem, sensitive to nitrofurantoin. ?3/27-she had episode of fever along with altered mental status/responsiveness seen urgently underwent further work-up neurology consult MRI brain that showed punctate multifocal strokes, AKI and felt to be delirium in the setting of UTI.  Completed stroke work-up after Plavix load placed on aspirin  Plavix". ? ?08/19/2021: Patient seen.  No new complaints.  Worsening renal function.  Serum creatinine has gone from 1.17-1.7.  BUN has risen from 19-25.  WBC is stable.  We will check urinalysis and urine sodium.  For IV fluids if indicated.  We will continue to monitor renal function closely. ?   ?   ?Subjective: ?No new complaints today. ?No fever or chills. ?No chest pain or shortness of breath. ? ?Assessment and Plan: ?Principal Problem: ?  Hip fracture ([place]) ?Active Problems: ?    Hypokalemia ?  Elevated CK ?  Elevated troponin ?  UTI (urinary tract infection) ?  Hypothyroidism ?  Hyperlipidemia ?  Essential hypertension ?  Coronary atherosclerosis ?  Carotid bruit ?  Dehydration ?  NSVT (nonsustained ventricular tachycardia) ?  Normocytic anemia ?  Protein-calorie malnutrition, severe ?  Aggression ?  AKI (acute kidney injury) ([place]) ?  Acute metabolic encephalopathy ?  Lactic acidosis ?  ?Closed 2 part intertrochanteric fracture of the right femur status post mechanical fall: Seen by cardiology and preop underwent echocardiogram and carotid duplex. S/P IM fixation Rt femur by Dr. Lyla Glassing 08/10/21.  Was on aspirin 325 for DVT prophylaxis but changed to aspirin/Plavix for stroke see below.  Continue pain management muscle relaxant and stool softener as ordered. ?08/19/2021: Stable.  Continue to manage expectantly. ? ?Leukocytosis ?ESBL E COLI UTI ?Febrile episode 3/27 am: ?Febrile but it was rectal temp  3/27 am-chest x-ray no acute finding recheck UA w/ pyurea-continued on meropenem  and completed course 3/29.  Low-grade fever-encourage incentive spirometry.Blood culture negative so far.  ?08/09/2021: Patient is no longer on antibiotics. ? ?Recent Labs  ?Lab 08/15/21 ?1111 08/15/21 ?1630 08/16/21 ?3382  08/17/21 ?0440 08/18/21 ?6073 08/19/21 ?7106  ?WBC 13.1*  --  8.5 7.9 9.0 10.2  ?LATICACIDVEN 2.9* 1.0  --   --   --   --   ? ?Episode of unresponsiveness/altered mental status ?Acute metabolic  encephalopathy-resolved ?Punctate multifocal strokes: ? S/p neuro work-up -that showed punctate multifocal stroke-possibly fat embolus from hip fracture vs hypoperfusion versus central embolic source.  Stroke work-up completed per neurology,  s/p Plavix load 300 followed by 75 MG PLAVIX 21 days along with aspirin 81 mg lifelong -Dr. Lyla Glassing okay with the plan.Cont statins.  See neurology note. ? ?AKI ?Lactic acidosis ?Dehydration: ?Likely from poor oral intake multifactorial postop status.  After IV fluids creatinine now downtrending, improved to 1.1.  We will wean down vf,encourage oral intake. ?08/19/2021: Continue to monitor renal function and electrolytes.  Serum creatinine today is 1.7.  Avoid nephrotoxins.  Keep MAP greater than 65 mmHg.  Check urinalysis and urine sodium.  Further management depend on above. ?Recent Labs  ?Lab 08/15/21 ?1111 08/16/21 ?0409 08/17/21 ?0440 08/18/21 ?2694 08/19/21 ?8546  ?BUN 27* 25* 24* 19 25*  ?CREATININE 2.04* 1.58* 1.43* 1.17* 1.70*  ? ?  ?Traumatic rhabdomyolysis: s/p ivf. resolved ?Dehydration:Resolved.Encourage p.o.  ?Hypokalemia:Resolved ? ?Normocytic anemia ?Folate deficiency anemia ?Acute blood loss anemia ?hb in January was 12.2 g but before that in May/25/21- 8.9 g.  Hemoglobin further downtrending 7.3 g this morning, described patient and daughter simply symptomatic with weakness shortness of breath not mobilizing much.  Agrees for 1 unit PRBC transfusion after explaining risks and benefits.  ?Suspect Acute blood loss due to hip fracture and hemodilution from IV fluids. Anemia panel suggestive of low folic acid with a stable B12 and iron . ?Recent Labs  ?Lab 08/16/21 ?0409 08/17/21 ?0440 08/17/21 ?1436 08/18/21 ?2703 08/19/21 ?5009  ?HGB 8.2* 7.4* 7.7* 7.3* 8.3*  ?HCT 26.9* 23.8* 24.5* 23.0* 26.3*  ? ?Acute hypoxic respiratory failure patient needing 2 L [place]-cxr stbale-suspect in the setting of psot op, not takign deep breath and not mobilizing much , cut down opioid  use s/p perfusion scan low probability for PE.  Wean oxygen as tolerated. ?  ?Abnormal EKG-with ST depression diffuse ?NSVT  ?CAD ?HLD: ?Mildly elevated troponin 141-152: Likely demand ischemia.  Troponin 3/20 in a.m. normal at 10. ?Cardiology has seen preop.Appreciate input denies chest pain.  Echo EF more than 70%, no RWMA, has G1 DD.  Stable.  On Crestor. ? ?Hypertension: BP stable.  On chlorthalidone at home.stopped due to AKI.   ?Atherosclerotic plaque in the carotid arteries in the CT scan incidentally noted, carotid duplex no acute finding. ?Hypothyroidism:on synthroid.Euthyroid with TSH 1.4 ?Deep tissues injury  POA from fall, continue wound care appreciate wound care input.  ? ?DVT prophylaxis: SCDs Start: 08/11/21 0121 ASA 325 mg per ortho ?Code Status:   Code Status: Full Code ?Family Communication: plan of care discussed with patient.Discussed with patient's daughter ?Patient status is: Inpatient level of care: Telemetry  ?Remains inpatient because: Ongoing management of post  hip fracture/ stroke ?Patient currently not stable ? ?Dispo: The patient is from:Home ?           Anticipated disposition: SN[facility] over the weekend if hemoglobin stabilizes.  Daughter agreeable for discharge to SNF Saturday if bed available, patient has been somewhat reluctant but will likely agree. ? ?Mobility Assessment (last 72 hours)   ? ? Mobility Assessment   ? ? [place] Name 08/18/21 1701 08/18/21 0811 08/17/21 2233 08/17/21 0900 08/16/21 1642  ? Does  patient have an order for bedrest or is patient medically unstable No - Continue assessment No - Continue assessment No - Continue assessment No - Continue assessment No - Continue assessment  ? What is the highest level of mobility based on the progressive mobility assessment? Level 3 (Stands with assist) - Balance while standing  and cannot march in place Level 3 (Stands with assist) - Balance while standing  and cannot march in place Level 3 (Stands with assist) - Balance while  standing  and cannot march in place Level 3 (Stands with assist) - Balance while standing  and cannot march in place Level 3 (Stands with assist) - Balance while standing  and cannot march in place  ? Is the above level di

## 2021-08-19 NOTE — Progress Notes (Signed)
Occupational Therapy Treatment ?Patient Details ?Name: Kerri [name] ?MRN: [id number] ?DOB: [birth year] ?Today's Date: 08/19/2021 ? ? ?History of present illness Patient is an 88 year old female admitted on 08/09/21 after fall with intertrochanteric fracture of right femur and traumatic rhabdomyolysis.  Pt is s/p IM nail on 08/10/21. PMH: hypertension history of syncope cluster headaches, asthma, CAD, chronic back pain, depression, HLD, GERD, hypothyroidism, irritable bowel syndrome, history of melanoma ?  ?OT comments ? Upon arrival to patient's room patient seated on bedside commode with nursing staff present. Patient needing heavy max +2 to stand with max cues to assist pulling on sara stedy while RN perform perianal care after bowel movement. Patient fearful of falling "I'm falling!" Needing max reassurance that stedy paddles are behind her and she will not fall. Patient transferred to recliner with stedy after using commode. Patient continues to need heavy +2 assistance for mobility and self care tasks.   ? ?Recommendations for follow up therapy are one component of a multi-disciplinary discharge planning process, led by the attending physician.  Recommendations may be updated based on patient status, additional functional criteria and insurance authorization. ?   ?Follow Up Recommendations ? Skilled nursing-short term rehab (<3 hours/day)  ?  ?Assistance Recommended at Discharge Frequent or constant Supervision/Assistance  ?Patient can return home with the following ? Two people to help with walking and/or transfers;Two people to help with bathing/dressing/bathroom;Assistance with cooking/housework;Assist for transportation;Help with stairs or ramp for entrance ?  ?Equipment Recommendations ? BSC/3in1  ?  ?   ?Precautions / Restrictions Precautions ?Precautions: Fall ?Precaution Comments: anxiety ?Restrictions ?RLE Weight Bearing: Weight bearing as tolerated  ? ? ?  ? ?Mobility Bed Mobility ?  ?  ?  ?  ?  ?  ?  ?General  bed mobility comments: OOB upon arrival ?  ? ? ?  ?Balance Overall balance assessment: History of Falls, Needs assistance ?  ?  ?  ?  ?Standing balance support: Bilateral upper extremity supported ?Standing balance-Leahy Scale: Zero ?Standing balance comment: Max +2 ?  ?  ?  ?  ?  ?  ?  ?  ?  ?  ?  ?   ? ?ADL either performed or assessed with clinical judgement  ? ?ADL Overall ADL's : Needs assistance/impaired ?  ?  ?  ?  ?  ?  ?  ?  ?  ?  ?  ?  ?Toilet Transfer: Total assistance;+2 for physical assistance;+2 for safety/equipment;BSC/3in1 ?Toilet Transfer Details (indicate cue type and reason): Patient seated on bedside commode with nursing staff upon arrival. Patient needing heavy max +2 to stand from commode and use of sara stedy to transfer from commode to recliner. Patient calls out regarding her leg and states "I'm going to fall" ?Toileting- Clothing Manipulation and Hygiene: Total assistance;+2 for physical assistance;+2 for safety/equipment;Sit to/from stand ?Toileting - Clothing Manipulation Details (indicate cue type and reason): Total A for perianal care after bowel movement with OT/rehab tech on either side of patient with max cues to pull up on sara stedy to assist with standing as nurse performs peri care ?  ?  ?Functional mobility during ADLs: Total assistance;+2 for physical assistance;+2 for safety/equipment ?General ADL Comments: Patient needing significant assistance for mobility and self care tasks, high anxiety, calls out in pain ?  ? ? ? ?Cognition Arousal/Alertness: Awake/alert ?Behavior During Therapy: Anxious ?Overall Cognitive Status: Within Functional Limits for tasks assessed ?  ?  ?  ?  ?  ?  ?  ?  ?  ?  ?  ?  ?  ?  ?  ?  ?  ?  ?  ?   ?   ?   ?   ? ? ?  Pertinent Vitals/ Pain       Pain Assessment ?Pain Assessment: Faces ?Faces Pain Scale: Hurts whole lot ?Pain Location: R hip with mobility  HIGH anxiety/fear of falling ?Pain Descriptors / Indicators: Grimacing, Guarding, Discomfort,  Tender ?Pain Intervention(s): Premedicated before session ? ?   ?   ? ?Frequency ? Min 2X/week  ? ? ? ? ?  ?Progress Toward Goals ? ?OT Goals(current goals can now be found in the care plan section) ? Progress towards OT goals: Progressing toward goals ? ?Acute Rehab OT Goals ?Patient Stated Goal: Go to rehab ?OT Goal Formulation: With family ?Time For Goal Achievement: 08/25/21 ?Potential to Achieve Goals: Fair ?ADL Goals ?Pt Will Perform Lower Body Dressing: with min assist;sit to/from stand;sitting/lateral leans ?Pt Will Transfer to Toilet: with mod assist;stand pivot transfer;bedside commode ?Pt Will Perform Toileting - Clothing Manipulation and hygiene: with mod assist;sit to/from stand;sitting/lateral leans ?Additional ADL Goal #1: Patient will tolerate 5 minutes of standing balance at mod +1 in order to participate in daily routine.  ?Plan Discharge plan remains appropriate   ? ?   ?AM-PAC OT "6 Clicks" Daily Activity     ?Outcome Measure ? ? Help from another person eating meals?: A Little ?Help from another person taking care of personal grooming?: A Little ?Help from another person toileting, which includes using toliet, bedpan, or urinal?: Total ?Help from another person bathing (including washing, rinsing, drying)?: A Lot ?Help from another person to put on and taking off regular upper body clothing?: A Lot ?Help from another person to put on and taking off regular lower body clothing?: Total ?6 Click Score: 12 ? ?  ?End of Session Equipment Utilized During Treatment:  Clarise Cruz stedy) ? ?OT Visit Diagnosis: Unsteadiness on feet (R26.81);Other abnormalities of gait and mobility (R26.89);Muscle weakness (generalized) (M62.81);History of falling (Z91.81) ?  ?Activity Tolerance Patient limited by pain ?  ?Patient Left in chair;with call bell/phone within reach;with chair alarm set;with nursing/sitter in room;with family/visitor present ?  ?Nurse Communication Other (comment) (RN present) ?  ? ?   ? ?Time:  1610-9604 ?OT Time Calculation (min): 13 min ? ?Charges: OT General Charges ?$OT Visit: 1 Visit ?OT Treatments ?$Self Care/Home Management : 8-22 mins ? ?Delbert Phenix OT ?OT pager: [phone] ? ? ?Rosemary Holms ?08/19/2021, 1:24 PM ?

## 2021-08-20 LAB — RENAL FUNCTION PANEL
Albumin: 2.3 g/dL — ABNORMAL LOW (ref 3.5–5.0)
Anion gap: 7 (ref 5–15)
BUN: 16 mg/dL (ref 8–23)
CO2: 26 mmol/L (ref 22–32)
Calcium: 7.9 mg/dL — ABNORMAL LOW (ref 8.9–10.3)
Chloride: 104 mmol/L (ref 98–111)
Creatinine, Ser: 0.89 mg/dL (ref 0.44–1.00)
GFR, Estimated: >60 mL/min (ref >60)
Glucose, Bld: 103 mg/dL — ABNORMAL HIGH (ref 70–99)
Phosphorus: 3.7 mg/dL (ref 2.5–4.6)
Potassium: 3.4 mmol/L — ABNORMAL LOW (ref 3.5–5.1)
Sodium: 137 mmol/L (ref 135–145)

## 2021-08-20 LAB — CULTURE, BLOOD (ROUTINE X 2)
Culture: NO GROWTH
Culture: NO GROWTH
Special Requests: ADEQUATE

## 2021-08-20 LAB — RESP PANEL BY RT-PCR (FLU A&B, COVID) ARPGX2
Influenza A by PCR: NEGATIVE
Influenza B by PCR: NEGATIVE
SARS Coronavirus 2 by RT PCR: NEGATIVE

## 2021-08-20 MED ORDER — LIDOCAINE 5 % EX PTCH
1.0000 | MEDICATED_PATCH | CUTANEOUS | Status: DC
  Administered 2021-08-20 – 2021-08-21 (×2): 1 via TRANSDERMAL
  Filled 2021-08-20 (×2): qty 1

## 2021-08-20 MED ORDER — TAPENTADOL HCL 50 MG PO TABS
50.0000 mg | ORAL_TABLET | Freq: Three times a day (TID) | ORAL | Status: DC | PRN
  Administered 2021-08-21 (×2): 50 mg via ORAL
  Filled 2021-08-20 (×2): qty 1

## 2021-08-20 NOTE — TOC Progression Note (Signed)
Transition of Care (TOC) - Progression Note  ? ? ?Patient Details  ?Name: Kerri [name] ?MRN: [id number] ?Date of Birth: September 04, 1929 ? ?Transition of Care (TOC) CM/SW Contact  ?Ross Ludwig, LCSW ?Phone Number: ?08/20/2021, 11:46 AM ? ?Clinical Narrative:    ? ?CSW received message from physician, that patient's daughter was asking if SNF can get the Nucynta for her.  CSW contacted Rolla Plate at [facility] [phone] and she said patient can discharge to SNF tomorrow if she is medically ready for discharge.  She requested that patient's family can bring the Nucynta bottle with her, and they can get it for patient.  CSW called patient's daughter [name] and updated her that she will have to bring the bottle from home.  CSW updated attending physician. ? ? ?Expected Discharge Plan: [place] ?Barriers to Discharge: Continued Medical Work up ? ?Expected Discharge Plan and Services ?Expected Discharge Plan: [place] ?  ?Discharge Planning Services: CM Consult ?Post Acute Care Choice: [place] ?Living arrangements for the past 2 months: Apartment ?Expected Discharge Date: 08/15/21               ?  ?  ?  ?  ?  ?  ?  ?  ?  ?  ? ? ?Social Determinants of Health (SDOH) Interventions ?  ? ?Readmission Risk Interventions ?   ? View : No data to display.  ?  ?  ?  ? ? ?

## 2021-08-20 NOTE — Progress Notes (Signed)
?PROGRESS NOTE ?Kerri [name]  KDX:[id number] DOB: 02-10-1930 DOA: 08/09/2021 ?PCP: Lavone Orn, MD  ? ?Brief Narrative/Hospital Course: ?As per prior documentation: "88 y.o. female with medical history significant of hypertension history of syncope cluster headaches, asthma, CAD, chronic back pain, depression, HLD, GERD, hypothyroidism, irritable bowel syndrome, history of melanoma presented to the ED after a fall that was 5 days PTA a and could not get up.  Per report while going to the shower tripped on her shoes and fell down and hurt her hip back, did not have a fall to call anybody. She is admitted for  Closed 2-part intertrochanteric fracture of right femur, traumatic rhabdomyolysis. Had abnormal ecg and pt could not provide hx,and had small run of NSVT, patient was admitted for orthopedic evaluation, cardiology was consulted for preoperative evaluation.  In the ED CT head no acute finding, but with severe atherosclerotic plaque of the carotid arteries within the neck, chest x-ray no acute finding CT abdomen pelvis no acute finding, CTA chest no evidence of infiltrate. ?Patient was admitted and seen by cardiology in preop evaluation underwent echocardiogram and carotid duplex.  Underwent IM nailing of the right femur postop course relatively stable but with verbal aggression, psych was consulted.  She also had acute blood loss anemia in the setting of hip fracture, also treated for UTI.  Continued on pain management IV antibiotics. ?Plan is for skilled nursing facility once cleared by orthopedics and urine culture resulted.  Urine culture grew ESBL, patient was managed with meropenem, sensitive to nitrofurantoin. ?3/27-she had episode of fever along with altered mental status/responsiveness seen urgently underwent further work-up neurology consult MRI brain that showed punctate multifocal strokes, AKI and felt to be delirium in the setting of UTI.  Completed stroke work-up after Plavix load placed on aspirin  Plavix". ? ?hip fracture/ stroke/uti ? ?   ?   ?Subjective: ? ? ?Reports left neck/shoulder pain ?No fever or chills. ?No chest pain or shortness of breath. ?Cr normalized ?Family at bedside, daughter think dilaudid make patient very drowsy, she would like to discontinue dilaudid and increase nucynta as this has worked well for the patient in the past ? ?Assessment and Plan: ?Principal Problem: ?  Hip fracture ([place]) ?Active Problems: ?    Hypokalemia ?  Elevated CK ?  Elevated troponin ?  UTI (urinary tract infection) ?  Hypothyroidism ?  Hyperlipidemia ?  Essential hypertension ?  Coronary atherosclerosis ?  Carotid bruit ?  Dehydration ?  NSVT (nonsustained ventricular tachycardia) ([place]) ?  Normocytic anemia ?  Protein-calorie malnutrition, severe ?  Aggression ?  AKI (acute kidney injury) ([place]) ?  Acute metabolic encephalopathy ?  Lactic acidosis ?  ?Closed 2 part intertrochanteric fracture of the right femur status post mechanical fall: Seen by cardiology and preop underwent echocardiogram and carotid duplex. S/P IM fixation Rt femur by Dr. Lyla Glassing 08/10/21.  Was on aspirin 325 for DVT prophylaxis but changed to aspirin/Plavix for stroke see below.  Continue pain management muscle relaxant and stool softener as ordered. ?08/23/2021 Stable.  Continue to manage expectantly. Snf placement  ? ?Leukocytosis ?ESBL E COLI UTI ?Febrile episode 3/27 am: ?Febrile but it was rectal temp  3/27 am-chest x-ray no acute finding recheck UA w/ pyurea-continued on meropenem  and completed course 3/29.  Low-grade fever-encourage incentive spirometry.Blood culture negative so far.  ?08/09/2021: Patient is no longer on antibiotics. ?08/20/2021: no fever last 24hrs, patient does not like incentive spirometry, does not get out of bed much ? ?Recent  Labs  ?Lab 08/15/21 ?1111 08/15/21 ?1630 08/16/21 ?0409 08/17/21 ?0440 08/18/21 ?9373 08/19/21 ?4287  ?WBC 13.1*  --  8.5 7.9 9.0 10.2  ?LATICACIDVEN 2.9* 1.0  --   --   --   --   ?Episode of  unresponsiveness/altered mental status ?Acute metabolic encephalopathy-resolved ?Punctate multifocal strokes: ? S/p neuro work-up -that showed punctate multifocal stroke-possibly fat embolus from hip fracture vs hypoperfusion versus central embolic source.  Stroke work-up completed per neurology,  s/p Plavix load 300 followed by 75 MG PLAVIX 21 days along with aspirin 81 mg lifelong -Dr. Lyla Glassing okay with the plan.Cont statins.  See neurology note. ? ?AKI ?Lactic acidosis ?Dehydration: ?Likely from poor oral intake multifactorial postop status.  ?Cr improved and normalized ?Recent Labs  ?Lab 08/16/21 ?0409 08/17/21 ?0440 08/18/21 ?6811 08/19/21 ?5726 08/20/21 ?2035  ?BUN 25* 24* 19 25* 16  ?CREATININE 1.58* 1.43* 1.17* 1.70* 0.89  ?  ?Traumatic rhabdomyolysis: s/p ivf. resolved ?Dehydration:Resolved.Encourage p.o.  ?Hypokalemia:Resolved ? ?Normocytic anemia ?Folate deficiency anemia ?Acute blood loss anemia ?hb in January was 12.2 g but before that in May/25/21- 8.9 g.  Hemoglobin further downtrending 7.3 g this morning, described patient and daughter simply symptomatic with weakness shortness of breath not mobilizing much.  Agrees for 1 unit PRBC transfusion after explaining risks and benefits.  ?Suspect Acute blood loss due to hip fracture and hemodilution from IV fluids. Anemia panel suggestive of low folic acid with a stable B12 and iron . ?Recent Labs  ?Lab 08/16/21 ?0409 08/17/21 ?0440 08/17/21 ?1436 08/18/21 ?5974 08/19/21 ?1638  ?HGB 8.2* 7.4* 7.7* 7.3* 8.3*  ?HCT 26.9* 23.8* 24.5* 23.0* 26.3*  ?Acute hypoxic respiratory failure patient needing 2 L [place]-cxr stbale-suspect in the setting of psot op, not takign deep breath and not mobilizing much , cut down opioid use s/p perfusion scan low probability for PE.  Wean oxygen as tolerated. ?  ?Abnormal EKG-with ST depression diffuse ?NSVT  ?CAD ?HLD: ?Mildly elevated troponin 141-152: Likely demand ischemia.  Troponin 3/20 in a.m. normal at 10. ?Cardiology has  seen preop.Appreciate input denies chest pain.  Echo EF more than 70%, no RWMA, has G1 DD.  Stable.  On Crestor. ? ?Hypertension: BP stable.  On chlorthalidone at home.stopped due to AKI.   ?Atherosclerotic plaque in the carotid arteries in the CT scan incidentally noted, carotid duplex no acute finding. ?Hypothyroidism:on synthroid.Euthyroid with TSH 1.4 ?Deep tissues injury  POA from fall, continue wound care appreciate wound care input.  ? ?Left shoulder pain, ( reported on 4/1) ?On exam upper trach spasm, left shoulder range of motion intact ?Topical lidocaine patch  ? ?DVT prophylaxis: SCDs Start: 08/11/21 0121 ASA 325 mg per ortho ?Code Status:   Code Status: Full Code ?Family Communication: plan of care discussed with patient.Discussed with patient's daughter ?Patient status is: Inpatient level of care: Telemetry  ? ?Dispo: The patient is from:Home ?           Anticipated disposition: Daughter would like to have pain meds adjusted today and agreeable for discharge to SNF on 4/2. ? ?Mobility Assessment (last 72 hours)   ? ? Mobility Assessment   ? ? [place] Name 08/20/21 0915 08/19/21 1300 08/19/21 0801 08/18/21 1701 08/18/21 0811  ? Does patient have an order for bedrest or is patient medically unstable No - Continue assessment -- No - Continue assessment No - Continue assessment No - Continue assessment  ? What is the highest level of mobility based on the progressive mobility assessment? Level 2 ([place]) -  Balance while sitting on edge of bed and cannot stand Level 2 ([place]) - Balance while sitting on edge of bed and cannot stand Level 2 ([place]) - Balance while sitting on edge of bed and cannot stand Level 3 (Stands with assist) - Balance while standing  and cannot march in place Level 3 (Stands with assist) - Balance while standing  and cannot march in place  ? Is the above level different from baseline mobility prior to current illness? Yes - Recommend PT order Yes - Recommend PT order Yes -  Recommend PT order Yes - Recommend PT order Yes - Recommend PT order  ? ? [place] Name 08/17/21 2233  ?  ?  ?  ?  ? Does patient have an order for bedrest or is patient medically unstable No - Continue assessment      ?

## 2021-08-20 NOTE — Progress Notes (Signed)
MD Erlinda Hong notified that patient temp is 100.0 F. Pt presents to be lethargic. However, pt Aox4, and able to follow commands.  ?

## 2021-08-21 DIAGNOSIS — Z881 Allergy status to other antibiotic agents status: Secondary | ICD-10-CM | POA: Diagnosis not present

## 2021-08-21 DIAGNOSIS — I10 Essential (primary) hypertension: Secondary | ICD-10-CM | POA: Diagnosis not present

## 2021-08-21 DIAGNOSIS — M79604 Pain in right leg: Secondary | ICD-10-CM | POA: Diagnosis not present

## 2021-08-21 DIAGNOSIS — D649 Anemia, unspecified: Secondary | ICD-10-CM | POA: Diagnosis not present

## 2021-08-21 DIAGNOSIS — E039 Hypothyroidism, unspecified: Secondary | ICD-10-CM | POA: Diagnosis not present

## 2021-08-21 DIAGNOSIS — Z4789 Encounter for other orthopedic aftercare: Secondary | ICD-10-CM | POA: Diagnosis not present

## 2021-08-21 DIAGNOSIS — R748 Abnormal levels of other serum enzymes: Secondary | ICD-10-CM | POA: Diagnosis not present

## 2021-08-21 DIAGNOSIS — M810 Age-related osteoporosis without current pathological fracture: Secondary | ICD-10-CM | POA: Diagnosis not present

## 2021-08-21 DIAGNOSIS — K5900 Constipation, unspecified: Secondary | ICD-10-CM | POA: Diagnosis not present

## 2021-08-21 DIAGNOSIS — G9341 Metabolic encephalopathy: Secondary | ICD-10-CM | POA: Diagnosis not present

## 2021-08-21 DIAGNOSIS — M79662 Pain in left lower leg: Secondary | ICD-10-CM | POA: Diagnosis not present

## 2021-08-21 DIAGNOSIS — R2243 Localized swelling, mass and lump, lower limb, bilateral: Secondary | ICD-10-CM | POA: Diagnosis not present

## 2021-08-21 DIAGNOSIS — R1084 Generalized abdominal pain: Secondary | ICD-10-CM | POA: Diagnosis not present

## 2021-08-21 DIAGNOSIS — Z91011 Allergy to milk products: Secondary | ICD-10-CM | POA: Diagnosis not present

## 2021-08-21 DIAGNOSIS — S72101D Unspecified trochanteric fracture of right femur, subsequent encounter for closed fracture with routine healing: Secondary | ICD-10-CM | POA: Diagnosis not present

## 2021-08-21 DIAGNOSIS — K219 Gastro-esophageal reflux disease without esophagitis: Secondary | ICD-10-CM | POA: Diagnosis not present

## 2021-08-21 DIAGNOSIS — K589 Irritable bowel syndrome without diarrhea: Secondary | ICD-10-CM | POA: Diagnosis not present

## 2021-08-21 DIAGNOSIS — M179 Osteoarthritis of knee, unspecified: Secondary | ICD-10-CM | POA: Diagnosis not present

## 2021-08-21 DIAGNOSIS — Z885 Allergy status to narcotic agent status: Secondary | ICD-10-CM | POA: Diagnosis not present

## 2021-08-21 DIAGNOSIS — I4729 Other ventricular tachycardia: Secondary | ICD-10-CM | POA: Diagnosis not present

## 2021-08-21 DIAGNOSIS — M79661 Pain in right lower leg: Secondary | ICD-10-CM | POA: Diagnosis not present

## 2021-08-21 DIAGNOSIS — K6289 Other specified diseases of anus and rectum: Secondary | ICD-10-CM | POA: Diagnosis not present

## 2021-08-21 DIAGNOSIS — F32A Depression, unspecified: Secondary | ICD-10-CM | POA: Diagnosis not present

## 2021-08-21 DIAGNOSIS — Z7401 Bed confinement status: Secondary | ICD-10-CM | POA: Diagnosis not present

## 2021-08-21 DIAGNOSIS — Z9109 Other allergy status, other than to drugs and biological substances: Secondary | ICD-10-CM | POA: Diagnosis not present

## 2021-08-21 DIAGNOSIS — R194 Change in bowel habit: Secondary | ICD-10-CM | POA: Diagnosis not present

## 2021-08-21 DIAGNOSIS — E785 Hyperlipidemia, unspecified: Secondary | ICD-10-CM | POA: Diagnosis not present

## 2021-08-21 DIAGNOSIS — S72001A Fracture of unspecified part of neck of right femur, initial encounter for closed fracture: Secondary | ICD-10-CM | POA: Diagnosis not present

## 2021-08-21 DIAGNOSIS — E876 Hypokalemia: Secondary | ICD-10-CM | POA: Diagnosis not present

## 2021-08-21 DIAGNOSIS — M4800 Spinal stenosis, site unspecified: Secondary | ICD-10-CM | POA: Diagnosis not present

## 2021-08-21 DIAGNOSIS — Z888 Allergy status to other drugs, medicaments and biological substances status: Secondary | ICD-10-CM | POA: Diagnosis not present

## 2021-08-21 DIAGNOSIS — N179 Acute kidney failure, unspecified: Secondary | ICD-10-CM | POA: Diagnosis not present

## 2021-08-21 DIAGNOSIS — R0902 Hypoxemia: Secondary | ICD-10-CM | POA: Diagnosis not present

## 2021-08-21 DIAGNOSIS — R29898 Other symptoms and signs involving the musculoskeletal system: Secondary | ICD-10-CM | POA: Diagnosis not present

## 2021-08-21 DIAGNOSIS — N390 Urinary tract infection, site not specified: Secondary | ICD-10-CM | POA: Diagnosis not present

## 2021-08-21 DIAGNOSIS — R5381 Other malaise: Secondary | ICD-10-CM | POA: Diagnosis not present

## 2021-08-21 DIAGNOSIS — I2510 Atherosclerotic heart disease of native coronary artery without angina pectoris: Secondary | ICD-10-CM | POA: Diagnosis not present

## 2021-08-21 DIAGNOSIS — R600 Localized edema: Secondary | ICD-10-CM | POA: Diagnosis not present

## 2021-08-21 DIAGNOSIS — E43 Unspecified severe protein-calorie malnutrition: Secondary | ICD-10-CM | POA: Diagnosis not present

## 2021-08-21 DIAGNOSIS — M1711 Unilateral primary osteoarthritis, right knee: Secondary | ICD-10-CM | POA: Diagnosis not present

## 2021-08-21 MED ORDER — FUROSEMIDE 10 MG/ML IJ SOLN
20.0000 mg | Freq: Once | INTRAMUSCULAR | Status: AC
  Administered 2021-08-21: 20 mg via INTRAVENOUS
  Filled 2021-08-21: qty 2

## 2021-08-21 MED ORDER — LIDOCAINE 5 % EX PTCH: 1.0000 | MEDICATED_PATCH | CUTANEOUS | 0 refills | Status: DC
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
## 2021-08-21 NOTE — Plan of Care (Signed)
Pt for discharge to SNF: [facility] this afternoon.  ?AVS printed and reviewed with daughter [name] at bedside.  ?Report called to receiving facility, spoke with Janett Billow. AVS faxed per her request.  ?New medications and follow ups discussed. Wound care orders reviewed.  ?All questions addressed, verbalized understanding.  ?IV and telemetry removed. ? ? ?Problem: Education: ?Goal: Knowledge of General Education information will improve ?Description: Including pain rating scale, medication(s)/side effects and non-pharmacologic comfort measures ?08/21/2021 1301 by Rutherford Guys, RN ?Outcome: Adequate for Discharge ?  ?Problem: Clinical Measurements: ?Goal: Ability to maintain clinical measurements within normal limits will improve ?08/21/2021 1301 by Rutherford Guys, RN ?Outcome: Adequate for Discharge ?  ?Problem: Activity: ?Goal: Risk for activity intolerance will decrease ?08/21/2021 1301 by Rutherford Guys, RN ?Outcome: Adequate for Discharge ?  ?Problem: Coping: ?Goal: Level of anxiety will decrease ?08/21/2021 1301 by Rutherford Guys, RN ?Outcome: Adequate for Discharge ?  ?Problem: Pain Managment: ?Goal: General experience of comfort will improve ?08/21/2021 1301 by Rutherford Guys, RN ?Outcome: Adequate for Discharge ?  ?Problem: Safety: ?Goal: Ability to remain free from injury will improve ?08/21/2021 1301 by Rutherford Guys, RN ?Outcome: Adequate for Discharge ?

## 2021-08-21 NOTE — Plan of Care (Signed)

## 2021-08-21 NOTE — TOC Transition Note (Addendum)
Transition of Care (TOC) - CM/SW Discharge Note ? ? ?Patient Details  ?Name: Kerri [name] ?MRN: [id number] ?Date of Birth: 12/19/1929 ? ?Transition of Care (TOC) CM/SW Contact:  ?Ross Ludwig, LCSW ?Phone Number: ?08/21/2021, 11:41 AM ? ? ?Clinical Narrative:    ? ?Patient to be d/c'ed today to [facility].  Patient and family agreeable to plans will transport via ems RN to call report [phone].  Patient's daughter is aware that she will be discharging today.  Palliative recommended to follow outpatient at SNF.  CSW spoke to Maynard at [facility], and she said they use [facility] for outpatient palliative and the facility will reach out to agency tomorrow.  ? ? ? ?Final next level of care: [place] ?Barriers to Discharge: Barriers Resolved ? ? ?Patient Goals and CMS Choice ?Patient states their goals for this hospitalization and ongoing recovery are:: To go to SNF then return back home. ?CMS Medicare.gov Compare Post Acute Care list provided to:: Patient Represenative (must comment) ?Choice offered to / list presented to : Adult Children ? ?Discharge Placement ?PASRR number recieved: 08/11/21 ?           ?Patient chooses bed at: [facility] ?Patient to be transferred to facility by: [facility] ?Name of family member notified: Daughter [name] [phone] ?Patient and family notified of of transfer: 08/21/21 ? ?Discharge Plan and Services ?  ?Discharge Planning Services: CM Consult ?Post Acute Care Choice: [place]          ?  ?  ?  ?  ?  ?  ?  ?  ?  ?  ? ?Social Determinants of Health (SDOH) Interventions ?  ? ? ?Readmission Risk Interventions ?   ? View : No data to display.  ?  ?  ?  ? ? ? ? ? ?

## 2021-08-22 DIAGNOSIS — Z881 Allergy status to other antibiotic agents status: Secondary | ICD-10-CM | POA: Diagnosis not present

## 2021-08-22 DIAGNOSIS — K6289 Other specified diseases of anus and rectum: Secondary | ICD-10-CM | POA: Diagnosis not present

## 2021-08-22 DIAGNOSIS — Z888 Allergy status to other drugs, medicaments and biological substances status: Secondary | ICD-10-CM | POA: Diagnosis not present

## 2021-08-22 DIAGNOSIS — E876 Hypokalemia: Secondary | ICD-10-CM | POA: Diagnosis not present

## 2021-08-22 DIAGNOSIS — K5900 Constipation, unspecified: Secondary | ICD-10-CM | POA: Diagnosis not present

## 2021-08-22 DIAGNOSIS — R194 Change in bowel habit: Secondary | ICD-10-CM | POA: Diagnosis not present

## 2021-08-22 DIAGNOSIS — Z91011 Allergy to milk products: Secondary | ICD-10-CM | POA: Diagnosis not present

## 2021-08-22 DIAGNOSIS — Z885 Allergy status to narcotic agent status: Secondary | ICD-10-CM | POA: Diagnosis not present

## 2021-08-22 DIAGNOSIS — Z9109 Other allergy status, other than to drugs and biological substances: Secondary | ICD-10-CM | POA: Diagnosis not present

## 2021-08-22 DIAGNOSIS — R600 Localized edema: Secondary | ICD-10-CM | POA: Diagnosis not present

## 2021-08-23 DIAGNOSIS — I10 Essential (primary) hypertension: Secondary | ICD-10-CM | POA: Diagnosis not present

## 2021-08-23 DIAGNOSIS — S72001A Fracture of unspecified part of neck of right femur, initial encounter for closed fracture: Secondary | ICD-10-CM | POA: Diagnosis not present

## 2021-08-23 DIAGNOSIS — I2510 Atherosclerotic heart disease of native coronary artery without angina pectoris: Secondary | ICD-10-CM | POA: Diagnosis not present

## 2021-08-23 DIAGNOSIS — M4800 Spinal stenosis, site unspecified: Secondary | ICD-10-CM | POA: Diagnosis not present

## 2021-08-24 DIAGNOSIS — R5381 Other malaise: Secondary | ICD-10-CM | POA: Diagnosis not present

## 2021-08-24 DIAGNOSIS — S72001A Fracture of unspecified part of neck of right femur, initial encounter for closed fracture: Secondary | ICD-10-CM | POA: Diagnosis not present

## 2021-08-24 DIAGNOSIS — M79604 Pain in right leg: Secondary | ICD-10-CM | POA: Diagnosis not present

## 2021-08-24 DIAGNOSIS — I10 Essential (primary) hypertension: Secondary | ICD-10-CM | POA: Diagnosis not present

## 2021-08-26 DIAGNOSIS — R600 Localized edema: Secondary | ICD-10-CM | POA: Diagnosis not present

## 2021-08-31 DIAGNOSIS — S72001A Fracture of unspecified part of neck of right femur, initial encounter for closed fracture: Secondary | ICD-10-CM | POA: Diagnosis not present

## 2021-08-31 DIAGNOSIS — K219 Gastro-esophageal reflux disease without esophagitis: Secondary | ICD-10-CM | POA: Diagnosis not present

## 2021-08-31 DIAGNOSIS — M4800 Spinal stenosis, site unspecified: Secondary | ICD-10-CM | POA: Diagnosis not present

## 2021-08-31 DIAGNOSIS — I10 Essential (primary) hypertension: Secondary | ICD-10-CM | POA: Diagnosis not present

## 2021-09-07 DIAGNOSIS — M4800 Spinal stenosis, site unspecified: Secondary | ICD-10-CM | POA: Diagnosis not present

## 2021-09-07 DIAGNOSIS — I10 Essential (primary) hypertension: Secondary | ICD-10-CM | POA: Diagnosis not present

## 2021-09-07 DIAGNOSIS — S72001A Fracture of unspecified part of neck of right femur, initial encounter for closed fracture: Secondary | ICD-10-CM | POA: Diagnosis not present

## 2021-09-07 DIAGNOSIS — I2510 Atherosclerotic heart disease of native coronary artery without angina pectoris: Secondary | ICD-10-CM | POA: Diagnosis not present

## 2021-09-08 ENCOUNTER — Ambulatory Visit: Payer: Medicare Other | Admitting: Physician Assistant

## 2021-09-09 ENCOUNTER — Inpatient Hospital Stay (HOSPITAL_COMMUNITY): Payer: Medicare Other

## 2021-09-09 ENCOUNTER — Encounter (HOSPITAL_COMMUNITY): Payer: Self-pay | Admitting: Internal Medicine

## 2021-09-09 ENCOUNTER — Inpatient Hospital Stay (HOSPITAL_COMMUNITY)
Admission: EM | Admit: 2021-09-09 | Discharge: 2021-09-12 | DRG: 689 | Disposition: A | Discharge status: Home / Self Care | Payer: Medicare Other | Attending: Internal Medicine | Admitting: Internal Medicine

## 2021-09-09 ENCOUNTER — Emergency Department (HOSPITAL_COMMUNITY): Payer: Medicare Other

## 2021-09-09 ENCOUNTER — Other Ambulatory Visit: Payer: Self-pay

## 2021-09-09 DIAGNOSIS — T7840XA Allergy, unspecified, initial encounter: Secondary | ICD-10-CM | POA: Diagnosis not present

## 2021-09-09 DIAGNOSIS — Z7982 Long term (current) use of aspirin: Secondary | ICD-10-CM | POA: Diagnosis not present

## 2021-09-09 DIAGNOSIS — Z96642 Presence of left artificial hip joint: Secondary | ICD-10-CM | POA: Diagnosis present

## 2021-09-09 DIAGNOSIS — Z85820 Personal history of malignant melanoma of skin: Secondary | ICD-10-CM

## 2021-09-09 DIAGNOSIS — Z888 Allergy status to other drugs, medicaments and biological substances status: Secondary | ICD-10-CM

## 2021-09-09 DIAGNOSIS — Z515 Encounter for palliative care: Secondary | ICD-10-CM

## 2021-09-09 DIAGNOSIS — Z79899 Other long term (current) drug therapy: Secondary | ICD-10-CM

## 2021-09-09 DIAGNOSIS — E039 Hypothyroidism, unspecified: Secondary | ICD-10-CM | POA: Diagnosis not present

## 2021-09-09 DIAGNOSIS — Z79890 Hormone replacement therapy (postmenopausal): Secondary | ICD-10-CM

## 2021-09-09 DIAGNOSIS — Z66 Do not resuscitate: Secondary | ICD-10-CM | POA: Diagnosis present

## 2021-09-09 DIAGNOSIS — F419 Anxiety disorder, unspecified: Secondary | ICD-10-CM | POA: Diagnosis present

## 2021-09-09 DIAGNOSIS — I10 Essential (primary) hypertension: Secondary | ICD-10-CM | POA: Diagnosis not present

## 2021-09-09 DIAGNOSIS — R339 Retention of urine, unspecified: Secondary | ICD-10-CM | POA: Diagnosis not present

## 2021-09-09 DIAGNOSIS — Z8673 Personal history of transient ischemic attack (TIA), and cerebral infarction without residual deficits: Secondary | ICD-10-CM | POA: Diagnosis not present

## 2021-09-09 DIAGNOSIS — H02401 Unspecified ptosis of right eyelid: Secondary | ICD-10-CM | POA: Diagnosis present

## 2021-09-09 DIAGNOSIS — E785 Hyperlipidemia, unspecified: Secondary | ICD-10-CM | POA: Diagnosis present

## 2021-09-09 DIAGNOSIS — R509 Fever, unspecified: Secondary | ICD-10-CM | POA: Diagnosis not present

## 2021-09-09 DIAGNOSIS — M961 Postlaminectomy syndrome, not elsewhere classified: Secondary | ICD-10-CM | POA: Diagnosis present

## 2021-09-09 DIAGNOSIS — R21 Rash and other nonspecific skin eruption: Secondary | ICD-10-CM | POA: Diagnosis not present

## 2021-09-09 DIAGNOSIS — F32A Depression, unspecified: Secondary | ICD-10-CM | POA: Diagnosis present

## 2021-09-09 DIAGNOSIS — R338 Other retention of urine: Secondary | ICD-10-CM | POA: Diagnosis not present

## 2021-09-09 DIAGNOSIS — K219 Gastro-esophageal reflux disease without esophagitis: Secondary | ICD-10-CM | POA: Diagnosis present

## 2021-09-09 DIAGNOSIS — G894 Chronic pain syndrome: Secondary | ICD-10-CM | POA: Diagnosis present

## 2021-09-09 DIAGNOSIS — Z8249 Family history of ischemic heart disease and other diseases of the circulatory system: Secondary | ICD-10-CM

## 2021-09-09 DIAGNOSIS — E876 Hypokalemia: Secondary | ICD-10-CM | POA: Diagnosis present

## 2021-09-09 DIAGNOSIS — I2510 Atherosclerotic heart disease of native coronary artery without angina pectoris: Secondary | ICD-10-CM | POA: Diagnosis not present

## 2021-09-09 DIAGNOSIS — Z7902 Long term (current) use of antithrombotics/antiplatelets: Secondary | ICD-10-CM

## 2021-09-09 DIAGNOSIS — E538 Deficiency of other specified B group vitamins: Secondary | ICD-10-CM | POA: Diagnosis present

## 2021-09-09 DIAGNOSIS — B964 Proteus (mirabilis) (morganii) as the cause of diseases classified elsewhere: Secondary | ICD-10-CM | POA: Diagnosis present

## 2021-09-09 DIAGNOSIS — I700 Atherosclerosis of aorta: Secondary | ICD-10-CM | POA: Diagnosis not present

## 2021-09-09 DIAGNOSIS — Z87891 Personal history of nicotine dependence: Secondary | ICD-10-CM

## 2021-09-09 DIAGNOSIS — Z885 Allergy status to narcotic agent status: Secondary | ICD-10-CM

## 2021-09-09 DIAGNOSIS — L89150 Pressure ulcer of sacral region, unstageable: Secondary | ICD-10-CM | POA: Diagnosis present

## 2021-09-09 DIAGNOSIS — D649 Anemia, unspecified: Secondary | ICD-10-CM | POA: Diagnosis present

## 2021-09-09 DIAGNOSIS — N390 Urinary tract infection, site not specified: Principal | ICD-10-CM | POA: Diagnosis present

## 2021-09-09 DIAGNOSIS — G9341 Metabolic encephalopathy: Secondary | ICD-10-CM | POA: Diagnosis present

## 2021-09-09 DIAGNOSIS — R300 Dysuria: Secondary | ICD-10-CM | POA: Diagnosis not present

## 2021-09-09 DIAGNOSIS — I639 Cerebral infarction, unspecified: Secondary | ICD-10-CM | POA: Diagnosis not present

## 2021-09-09 DIAGNOSIS — M549 Dorsalgia, unspecified: Secondary | ICD-10-CM | POA: Diagnosis present

## 2021-09-09 DIAGNOSIS — Z96651 Presence of right artificial knee joint: Secondary | ICD-10-CM | POA: Diagnosis present

## 2021-09-09 DIAGNOSIS — Z7189 Other specified counseling: Secondary | ICD-10-CM | POA: Diagnosis not present

## 2021-09-09 DIAGNOSIS — R410 Disorientation, unspecified: Secondary | ICD-10-CM | POA: Diagnosis not present

## 2021-09-09 DIAGNOSIS — R000 Tachycardia, unspecified: Secondary | ICD-10-CM | POA: Diagnosis not present

## 2021-09-09 LAB — BASIC METABOLIC PANEL
Anion gap: 9 (ref 5–15)
BUN: 16 mg/dL (ref 8–23)
CO2: 25 mmol/L (ref 22–32)
Calcium: 9 mg/dL (ref 8.9–10.3)
Chloride: 103 mmol/L (ref 98–111)
Creatinine, Ser: 0.76 mg/dL (ref 0.44–1.00)
GFR, Estimated: >60 mL/min (ref >60)
Glucose, Bld: 126 mg/dL — ABNORMAL HIGH (ref 70–99)
Potassium: 3.2 mmol/L — ABNORMAL LOW (ref 3.5–5.1)
Sodium: 137 mmol/L (ref 135–145)

## 2021-09-09 LAB — URINALYSIS, ROUTINE W REFLEX MICROSCOPIC
Bilirubin Urine: NEGATIVE
Glucose, UA: NEGATIVE mg/dL
Hgb urine dipstick: NEGATIVE
Ketones, ur: NEGATIVE mg/dL
Nitrite: NEGATIVE
Protein, ur: 100 mg/dL — AB
Specific Gravity, Urine: 1.015 (ref 1.005–1.030)
WBC, UA: >50 WBC/hpf — ABNORMAL HIGH (ref 0–5)
pH: 9 — ABNORMAL HIGH (ref 5.0–8.0)

## 2021-09-09 LAB — CBC
HCT: 37.5 % (ref 36.0–46.0)
Hemoglobin: 12 g/dL (ref 12.0–15.0)
MCH: 28.4 pg (ref 26.0–34.0)
MCHC: 32 g/dL (ref 30.0–36.0)
MCV: 88.9 fL (ref 80.0–100.0)
Platelets: 287 10*3/uL (ref 150–400)
RBC: 4.22 MIL/uL (ref 3.87–5.11)
RDW: 16 % — ABNORMAL HIGH (ref 11.5–15.5)
WBC: 9.6 10*3/uL (ref 4.0–10.5)
nRBC: 0 % (ref 0.0–0.2)

## 2021-09-09 LAB — PROTIME-INR
INR: 1.1 (ref 0.8–1.2)
Prothrombin Time: 14.5 seconds (ref 11.4–15.2)

## 2021-09-09 LAB — HEPATIC FUNCTION PANEL
ALT: 8 U/L (ref 0–44)
AST: 16 U/L (ref 15–41)
Albumin: 3.5 g/dL (ref 3.5–5.0)
Alkaline Phosphatase: 126 U/L (ref 38–126)
Bilirubin, Direct: 0.1 mg/dL (ref 0.0–0.2)
Indirect Bilirubin: 0.5 mg/dL (ref 0.3–0.9)
Total Bilirubin: 0.6 mg/dL (ref 0.3–1.2)
Total Protein: 7.2 g/dL (ref 6.5–8.1)

## 2021-09-09 LAB — LACTIC ACID, PLASMA: Lactic Acid, Venous: 1.2 mmol/L (ref 0.5–1.9)

## 2021-09-09 LAB — MAGNESIUM: Magnesium: 2 mg/dL (ref 1.7–2.4)

## 2021-09-09 LAB — PHOSPHORUS: Phosphorus: 2.9 mg/dL (ref 2.5–4.6)

## 2021-09-09 LAB — APTT: aPTT: 34 seconds (ref 24–36)

## 2021-09-09 MED ORDER — LIDOCAINE 5 % EX PTCH
1.0000 | MEDICATED_PATCH | Freq: Every day | CUTANEOUS | Status: DC
  Administered 2021-09-09 – 2021-09-12 (×4): 1 via TRANSDERMAL
  Filled 2021-09-09 (×4): qty 1

## 2021-09-09 MED ORDER — ENOXAPARIN SODIUM 30 MG/0.3ML IJ SOSY
30.0000 mg | PREFILLED_SYRINGE | INTRAMUSCULAR | Status: DC
  Administered 2021-09-09: 30 mg via SUBCUTANEOUS
  Filled 2021-09-09: qty 0.3

## 2021-09-09 MED ORDER — CHLORTHALIDONE 25 MG PO TABS
12.5000 mg | ORAL_TABLET | Freq: Every day | ORAL | Status: DC
  Administered 2021-09-10: 12.5 mg via ORAL
  Filled 2021-09-09: qty 0.5

## 2021-09-09 MED ORDER — POLYETHYLENE GLYCOL 3350 17 GM/SCOOP PO POWD
8.5000 g | Freq: Every day | ORAL | Status: DC
  Filled 2021-09-09: qty 255

## 2021-09-09 MED ORDER — ESTRADIOL 0.1 MG/GM VA CREA
1.0000 | TOPICAL_CREAM | VAGINAL | Status: DC
  Filled 2021-09-09: qty 42.5

## 2021-09-09 MED ORDER — TRETINOIN 0.1 % EX CREA
1.0000 "application " | TOPICAL_CREAM | Freq: Every day | CUTANEOUS | Status: DC
  Administered 2021-09-11: 1 via TOPICAL

## 2021-09-09 MED ORDER — LORATADINE 10 MG PO TABS
10.0000 mg | ORAL_TABLET | Freq: Every day | ORAL | Status: DC
  Administered 2021-09-10 – 2021-09-12 (×3): 10 mg via ORAL
  Filled 2021-09-09 (×3): qty 1

## 2021-09-09 MED ORDER — LACTATED RINGERS IV SOLN: INTRAVENOUS | Status: DC

## 2021-09-09 MED ORDER — SODIUM CHLORIDE 0.9 % IV SOLN
1.0000 g | Freq: Two times a day (BID) | INTRAVENOUS | Status: DC
  Administered 2021-09-09 – 2021-09-11 (×4): 1 g via INTRAVENOUS
  Filled 2021-09-09 (×7): qty 20

## 2021-09-09 MED ORDER — POTASSIUM CHLORIDE CRYS ER 20 MEQ PO TBCR
20.0000 meq | EXTENDED_RELEASE_TABLET | Freq: Every day | ORAL | Status: DC
  Administered 2021-09-10: 20 meq via ORAL
  Filled 2021-09-09: qty 1

## 2021-09-09 MED ORDER — GABAPENTIN 100 MG PO CAPS
100.0000 mg | ORAL_CAPSULE | Freq: Three times a day (TID) | ORAL | Status: DC
  Administered 2021-09-09 – 2021-09-12 (×7): 100 mg via ORAL
  Filled 2021-09-09 (×12): qty 1

## 2021-09-09 MED ORDER — SODIUM CHLORIDE 0.9 % IV SOLN
2.0000 g | Freq: Once | INTRAVENOUS | Status: AC
  Administered 2021-09-09: 2 g via INTRAVENOUS
  Filled 2021-09-09: qty 20

## 2021-09-09 MED ORDER — METOPROLOL SUCCINATE ER 25 MG PO TB24
12.5000 mg | ORAL_TABLET | Freq: Every day | ORAL | Status: DC
  Administered 2021-09-09 – 2021-09-12 (×4): 12.5 mg via ORAL
  Filled 2021-09-09 (×4): qty 1

## 2021-09-09 MED ORDER — DIPHENHYDRAMINE HCL 25 MG PO TABS: 25.0000 mg | ORAL_TABLET | Freq: Four times a day (QID) | ORAL | Status: DC | PRN

## 2021-09-09 MED ORDER — LEVOTHYROXINE SODIUM 50 MCG PO TABS
75.0000 ug | ORAL_TABLET | Freq: Every day | ORAL | Status: DC
  Administered 2021-09-10 – 2021-09-12 (×3): 75 ug via ORAL
  Filled 2021-09-09 (×5): qty 1

## 2021-09-09 MED ORDER — LORAZEPAM 2 MG/ML IJ SOLN: 0.5000 mg | Freq: Once | INTRAMUSCULAR | Status: DC | PRN

## 2021-09-09 MED ORDER — TAPENTADOL HCL 50 MG PO TABS
50.0000 mg | ORAL_TABLET | Freq: Two times a day (BID) | ORAL | Status: DC
  Administered 2021-09-09 – 2021-09-12 (×6): 50 mg via ORAL
  Filled 2021-09-09 (×7): qty 1

## 2021-09-09 MED ORDER — POTASSIUM CHLORIDE CRYS ER 20 MEQ PO TBCR
40.0000 meq | EXTENDED_RELEASE_TABLET | Freq: Once | ORAL | Status: AC
  Administered 2021-09-09: 40 meq via ORAL
  Filled 2021-09-09: qty 2

## 2021-09-09 MED ORDER — ROSUVASTATIN CALCIUM 10 MG PO TABS
10.0000 mg | ORAL_TABLET | Freq: Every day | ORAL | Status: DC
  Administered 2021-09-09 – 2021-09-11 (×3): 10 mg via ORAL
  Filled 2021-09-09 (×3): qty 1

## 2021-09-09 MED ORDER — POLYETHYLENE GLYCOL 3350 17 G PO PACK
8.5000 g | PACK | Freq: Every day | ORAL | Status: DC
  Administered 2021-09-09: 8.5 g via ORAL
  Filled 2021-09-09 (×2): qty 1

## 2021-09-09 MED ORDER — CLOPIDOGREL BISULFATE 75 MG PO TABS
75.0000 mg | ORAL_TABLET | Freq: Every day | ORAL | Status: DC
  Administered 2021-09-10 – 2021-09-12 (×3): 75 mg via ORAL
  Filled 2021-09-09 (×4): qty 1

## 2021-09-09 MED ORDER — VITAMIN B-12 1000 MCG PO TABS
500.0000 ug | ORAL_TABLET | Freq: Every day | ORAL | Status: DC
  Administered 2021-09-09 – 2021-09-12 (×3): 500 ug via ORAL
  Filled 2021-09-09 (×5): qty 1

## 2021-09-09 MED ORDER — HYDROXYZINE HCL 25 MG PO TABS: 25.0000 mg | ORAL_TABLET | Freq: Four times a day (QID) | ORAL | Status: DC | PRN

## 2021-09-09 MED ORDER — CHLORTHALIDONE 25 MG PO TABS
25.0000 mg | ORAL_TABLET | Freq: Every day | ORAL | Status: DC
Start: 2021-09-09 — End: 2021-09-09
  Filled 2021-09-09: qty 1

## 2021-09-09 NOTE — ED Notes (Addendum)
Daughter reports administering home tapentadol at 1440. Dose from pharmacy picked up by pharmacy tech to return to pharmacy. ?

## 2021-09-09 NOTE — Plan of Care (Signed)
?  Problem: Health Behavior/Discharge Planning: ?Goal: Ability to manage health-related needs will improve ?Outcome: Progressing ?  ?Problem: Elimination: ?Goal: Will not experience complications related to urinary retention ?Outcome: Progressing ?  ?Problem: Pain Managment: ?Goal: General experience of comfort will improve ?Outcome: Progressing ?  ?Problem: Skin Integrity: ?Goal: Risk for impaired skin integrity will decrease ?Outcome: Not Progressing ?  ?

## 2021-09-09 NOTE — Progress Notes (Signed)
Chaplain responded to consult for assigning HCPOA.  Kerri [name] currently has her daughter, Kerri [name], listed as HCPOA, but would like to assign her other daughter, Kerri [name], who lives locally and who is involved in her care to fulfill that role.  Chaplain brought paperwork to Kerri [name] and provided education.  Kerri [name] will likely get it notarized after discharge unless we are able to do it Monday morning. ? ?Kerri [name] and Kerri [name] also requested prayer, which chaplain provided, along with emotional support. ? ?[employer], Bcc ?PAger, [phone] ?

## 2021-09-09 NOTE — H&P (Signed)
?History and Physical  ? ? ?Patient: Kerri [name] UMP:[id number] DOB: January 14, 1930 ?DOA: 09/09/2021 ?DOS: the patient was seen and examined on 09/09/2021 ?PCP: Lavone Orn, MD  ?Patient coming from: Home ? ?Chief Complaint:  ?Chief Complaint  ?Patient presents with  ? Dysuria  ? Fever  ? Rash  ? ?HPI: Kerri [name] is a 88 y.o. female with medical history significant of colon polyps, asthma, basal cell carcinoma of the left forehead, CAD, carotid bruit, chronic back pain, depression/anxiety, diverticulosis, DJD, hyperlipidemia, history of NSVT fatty liver disease, GERD, hypertension, hypothyroidism, IBS, metastatic melanoma of LLE with multiple excisions, multiple skin squamous cell carcinoma lesions, migraine headaches, osteoporosis, spinal stenosis who was recently admitted and discharged due to hip fracture, multiple punctuated CVAs, ESBL UTI who brought to the emergency department due to confusion, fever, malodorous urine and incontinence.  She was oriented x2 with some difficulty, but after a few hours her mental status is improved according to her daughter.  She kept asking multiple times when she was going home and has some short-term memory difficulty.  She was able to answer simple questions and deny abdominal, back or chest pain at the moment. ? ?ED course: Initial vital signs were temperature 98.5 ?F, pulse 110, respirations 25, BP 145/80 mmHg and O2 sat 97% on room air.  The patient received ceftriaxone 2 g IVPB and 40 mEq of KCl p.o. ? ?Lab work: Urinalysis showed cloudy urine with large leukocyte esterase, more than 50 WBC and many bacteria on microscopic examination.  CBC is her white count 9.6, hemoglobin 12.0 g/dL platelets 287.  Normal PT, INR and PTT.  BMP showed a potassium of 3.2 mmol/L and glucose of 125 mg/dL.  The rest of the BMP, lactic acid, phosphorus, magnesium and LFTs were normal. ? ?Imaging: One-view portable chest radiograph show aortic atherosclerosis and low lung volumes without evidence of  acute cardiopulmonary disease.  MRI of the brain was negative for new strokes. ?  ?Review of Systems: As mentioned in the history of present illness. All other systems reviewed and are negative. ?Past Medical History:  ?Diagnosis Date  ? Adenomatous colon polyp   ? Asthma   ? Basal cell carcinoma 03/13/2017  ? left forehead - CX3 + cautery + 5FU  ? CAD (coronary artery disease)   ? Chest CTA 10/07: 40% or less pLAD;  ETT 8/09: negative  ? Carotid bruit   ? Chronic back pain   ? Depression   ? Diverticulosis   ? DJD (degenerative joint disease)   ? Dyslipidemia   ? Dysrhythmia   ? pvcs  ? Fatty liver   ? GERD (gastroesophageal reflux disease)   ? HTN (hypertension)   ? Hypothyroidism   ? IBS (irritable bowel syndrome)   ? Internal hemorrhoid   ? Melanoma ([place])   ? metastatic; left leg; s/p multiple excisions  ? Migraine   ? Osteoporosis   ? Spinal stenosis   ? Squamous cell carcinoma of skin 09/02/2015  ? mid chest - CX3 + 5FU  ? Squamous cell carcinoma of skin 11/07/2017  ? left wrist - tx p bx  ? Squamous cell carcinoma of skin 12/11/2017  ? left hand - tx p bx  ? ?Past Surgical History:  ?Procedure Laterality Date  ? BACK SURGERY    ? BLEPHAROPLASTY    ? BUNIONECTOMY WITH HAMMERTOE RECONSTRUCTION Right 01/16/2013  ? Procedure: RIGHT Barbie Banner OSTEOTOMY WITH SIMPLE BUNIONECTOMY AND SECOND HAMMER TOE CORRECTION;  Surgeon: Wylene Simmer, MD;  Location: [facility];  Service: Orthopedics;  Laterality: Right;  ? CARPAL TUNNEL RELEASE    ? FEMUR IM NAIL Right 08/10/2021  ? Procedure: INTRAMEDULLARY (IM) NAIL FEMORAL;  Surgeon: Rod Can, MD;  Location: [facility];  Service: Orthopedics;  Laterality: Right;  ? KNEE ARTHROSCOPY Right   ? LUMBAR LAMINECTOMY/DECOMPRESSION MICRODISCECTOMY N/A 11/13/2013  ? Procedure: LUMBAR TWO-THREE,LUMBAR THREE-FOUR,LUMBAR FOUR-FIVE LAMINECTOMY AND FORAMINOTOMY;  Surgeon: Ophelia Charter, MD;  Location: [facility];  Service: Neurosurgery;  Laterality: N/A;  ? MELANOMA EXCISION  Left 09/19/2012  ? Procedure: WIDE EXCISION METASTATIC MELANOMA LEFT MEDIAL THIGH;  Surgeon: Zenovia Jarred, MD;  Location: [facility];  Service: General;  Laterality: Left;  ? ROTATOR CUFF REPAIR    ? SHOULDER SURGERY    ? TOE SURGERY    ? TOTAL HIP ARTHROPLASTY  09/04/2011  ? Procedure: TOTAL HIP ARTHROPLASTY;  Surgeon: Gearlean Alf, MD;  Location: [facility];  Service: Orthopedics;  Laterality: Left;  ? TOTAL HIP REVISION Left 11/27/2018  ? Procedure: Left hip femoral revision;  Surgeon: Gaynelle Arabian, MD;  Location: [facility];  Service: Orthopedics;  Laterality: Left;  153mn  ? TOTAL KNEE ARTHROPLASTY Right 10/06/2019  ? Procedure: TOTAL KNEE ARTHROPLASTY;  Surgeon: AGaynelle Arabian MD;  Location: [facility];  Service: Orthopedics;  Laterality: Right;  576m  ? ?Social History:  reports that she quit smoking about 73 years ago. Her smoking use included cigarettes. She has never used smokeless tobacco. She reports current alcohol use of about 2.0 standard drinks per week. She reports that she does not use drugs. ? ?Allergies  ?Allergen Reactions  ? Buprenorphine Hcl Itching and Other (See Comments)  ?  Happened awhile back- patient noted that any narcotic-based medication makes her have a "bad reaction"  ? Codeine Nausea And Vomiting and Other (See Comments)  ?  Patient gets sick to her stomach  ? Morphine Itching and Other (See Comments)  ?  Happened awhile back- patient noted that any narcotic-based medication makes her have a "bad reaction"  ? Morphine And Related Itching and Other (See Comments)  ?  Happened awhile back- patient noted that any narcotic-based medication makes her have a "bad reaction"  ? Tramadol Nausea And Vomiting and Other (See Comments)  ?  Confusion and severe headaches, also  ? Adhesive [Tape] Other (See Comments)  ?  Tears the skin ?  ? Doxycycline Hyclate Other (See Comments)  ?  Makes the patient "ill"  ? Hydrocodone-Acetaminophen Nausea And Vomiting  ? Lactose Intolerance (Gi)  Nausea Only and Other (See Comments)  ?  GI upset ?  ? Nucynta [Tapentadol] Other (See Comments)  ?  Dizziness with doses higher than 50 mg twice a day  ? Oxycodone Nausea And Vomiting  ? ? ?Family History  ?Problem Relation Age of Onset  ? CAD Mother   ? CAD Father   ? Kidney disease Child   ? ? ?Prior to Admission medications   ?Medication Sig Start Date End Date Taking? Authorizing Provider  ?acetaminophen (TYLENOL) 500 MG tablet Take 500 mg by mouth 3 (three) times daily.    [provider]  ?alendronate (FOSAMAX) 70 MG tablet Take 70 mg by mouth every Sunday. 11/15/18   [provider]  ?aspirin EC 81 MG EC tablet Take 1 tablet (81 mg total) by mouth daily. Swallow whole. 08/17/21   KcAntonieta PertMD  ?Biotin 5 MG CAPS Take 5 mg by mouth daily.    [provider]  ?calcium carbonate (OS-CAL) 1250 (500 Ca) MG chewable tablet Chew 2 tablets by mouth daily.     [provider]  ?chlorthalidone (HYGROTON) 25 MG tablet Take 1 tablet (25 mg total) by mouth daily. 03/31/21   Minus Breeding, MD  ?Cholecalciferol (VITAMIN D3) 50 MCG (2000 UT) TABS Take 2,000 Units by mouth daily.    [provider]  ?docusate sodium (COLACE) 100 MG capsule Take 1 capsule (100 mg total) by mouth 2 (two) times daily. 08/15/21   Antonieta Pert, MD  ?erythromycin ophthalmic ointment Place 1 application. into both eyes daily.    [provider]  ?estradiol (ESTRACE) 0.1 MG/GM vaginal cream Place 1 Applicatorful vaginally every other day. 06/06/19   [provider]  ?Eyelid Cleansers (AVENOVA) 0.01 % SOLN Apply topically. 09/16/19   [provider]  ?ferrous sulfate 325 (65 FE) MG tablet Take 1 tablet (325 mg total) by mouth daily with breakfast. 08/15/21   Antonieta Pert, MD  ?Flurandrenolide [facility]) 4 MCG/SQCM TAPE Apply 1 each topically daily. 06/29/21   Lavonna Monarch, MD  ?fluticasone (FLONASE) 50 MCG/ACT nasal spray Place 2 sprays into both nostrils every morning.  03/29/19    [provider]  ?folic acid (FOLVITE) 1 MG tablet Take 1 tablet (1 mg total) by mouth daily. 08/15/21   Antonieta Pert, MD  ?gabapentin (NEURONTIN) 300 MG capsule Take 300 mg by mouth 3 (three) times dail

## 2021-09-09 NOTE — ED Notes (Addendum)
Pt from home via EMS c/o dysuria, feeling like her bladder is full, incontinence, foul odor, vaginal itching/pain and fever x2 days. EMS was called by pt's daughter due to these symptoms as well as some mild confusion. Pt's bladder scan shows 778 mL urine. Pt able to answer most questions appropriately. VSS. Oral temp only 98.6, but mouth is dry and skin feels warmer. Axillary temp of 99.9. Daughter reports temp of 100.6 at home. Pt got benadryl approx 1 hour PTA for perineal itching. ? ?Pt had a fall about 1 month ago and was on the floor for the span of a few days. At that time, pt was diagnosed with R hip fx, rhabdo, AKI and sepsis. Pt went to rehab upon discharge from the hospital and went home a couple days ago from rehab facility. Pt has been mostly non ambulatory since discharge. Daughter reports minimal urine output since being home via purewick catheter. ?

## 2021-09-09 NOTE — ED Notes (Signed)
Pt family, daughter [name]) updated on plan of care.  ?

## 2021-09-09 NOTE — ED Provider Notes (Signed)
?[facility] DEPT ?Provider Note ? ? ?CSN: [id number] ?Arrival date & time: 09/09/21  1937 ? ?  ? ?History ? ?Chief Complaint  ?Patient presents with  ? Dysuria  ? Fever  ? Rash  ? ? ?Kerri [name] is a 88 y.o. female. ? ?The history is provided by a relative. The history is limited by the condition of the patient (Altered mental status).  ?Dysuria ?Associated symptoms: fever   ?Fever ?Associated symptoms: dysuria and rash   ?Rash ?Associated symptoms: fever   ?She has history of hypertension, hyperlipidemia, coronary artery disease and is brought in from home because of fever.  Daughter takes care of her and states that she had only had about 200 mL of urine output during the day and she started running fever up to 100.8 at home tonight.  There were no known chills or sweats.  She was complaining of perineal itching and she was given a dose of diphenhydramine.  There has been no cough and no vomiting or diarrhea.  She had been hospitalized 1 month ago for hip fracture and also had rhabdomyolysis, and just got back home from her rehabilitation facility. ?  ?Home Medications ?Prior to Admission medications   ?Medication Sig Start Date End Date Taking? Authorizing Provider  ?acetaminophen (TYLENOL) 500 MG tablet Take 500 mg by mouth 3 (three) times daily.    [provider]  ?alendronate (FOSAMAX) 70 MG tablet Take 70 mg by mouth every Sunday. 11/15/18   [provider]  ?aspirin EC 81 MG EC tablet Take 1 tablet (81 mg total) by mouth daily. Swallow whole. 08/17/21   Antonieta Pert, MD  ?Biotin 5 MG CAPS Take 5 mg by mouth daily.    [provider]  ?calcium carbonate (OS-CAL) 1250 (500 Ca) MG chewable tablet Chew 2 tablets by mouth daily.     [provider]  ?chlorthalidone (HYGROTON) 25 MG tablet Take 1 tablet (25 mg total) by mouth daily. 03/31/21   Minus Breeding, MD  ?Cholecalciferol (VITAMIN D3) 50 MCG (2000 UT) TABS Take 2,000 Units by mouth daily.     [provider]  ?docusate sodium (COLACE) 100 MG capsule Take 1 capsule (100 mg total) by mouth 2 (two) times daily. 08/15/21   Antonieta Pert, MD  ?erythromycin ophthalmic ointment Place 1 application. into both eyes daily.    [provider]  ?estradiol (ESTRACE) 0.1 MG/GM vaginal cream Place 1 Applicatorful vaginally every other day. 06/06/19   [provider]  ?Eyelid Cleansers (AVENOVA) 0.01 % SOLN Apply topically. 09/16/19   [provider]  ?ferrous sulfate 325 (65 FE) MG tablet Take 1 tablet (325 mg total) by mouth daily with breakfast. 08/15/21   Antonieta Pert, MD  ?Flurandrenolide [facility]) 4 MCG/SQCM TAPE Apply 1 each topically daily. 06/29/21   Lavonna Monarch, MD  ?fluticasone (FLONASE) 50 MCG/ACT nasal spray Place 2 sprays into both nostrils every morning.  03/29/19   [provider]  ?folic acid (FOLVITE) 1 MG tablet Take 1 tablet (1 mg total) by mouth daily. 08/15/21   Antonieta Pert, MD  ?gabapentin (NEURONTIN) 300 MG capsule Take 300 mg by mouth 3 (three) times daily.    [provider]  ?levothyroxine (SYNTHROID, LEVOTHROID) 75 MCG tablet Take 75 mcg by mouth daily with breakfast.    [provider]  ?lidocaine (LIDODERM) 5 % Place 1 patch onto the skin daily. Remove & Discard patch within 12 hours or as directed by MD 08/21/21   Florencia Reasons,  MD  ?loratadine (CLARITIN) 10 MG tablet Take 10 mg by mouth daily.    [provider]  ?nutrition supplement, JUVEN, (JUVEN) PACK Take 1 packet by mouth 2 (two) times daily between meals. 08/15/21   Antonieta Pert, MD  ?polyethylene glycol powder (GLYCOLAX/MIRALAX) 17 GM/SCOOP powder Take 8.5 g by mouth daily.     [provider]  ?potassium chloride SA (KLOR-CON) 10 MEQ tablet Take 1 tablet (10 mEq total) by mouth daily. 08/25/20   Minus Breeding, MD  ?Probiotic Product (PROBIOTIC PO) Take 1 capsule by mouth daily.    [provider]  ?rosuvastatin (CRESTOR) 10 MG tablet Take 10 mg by mouth at  bedtime.    [provider]  ?senna (SENOKOT) 8.6 MG TABS tablet Take 1 tablet (8.6 mg total) by mouth 2 (two) times daily. 08/15/21   Antonieta Pert, MD  ?tapentadol (NUCYNTA) 50 MG tablet Take 1 tablet (50 mg total) by mouth every 8 (eight) hours as needed for up to 6 doses for severe pain. 08/15/21   Antonieta Pert, MD  ?tretinoin (RETIN-A) 0.05 % cream Apply 1 application topically every other day.     [provider]  ?tretinoin (RETIN-A) 0.1 % cream Apply topically at bedtime. 06/14/21 06/14/22  Warren Danes, PA-C  ?vitamin B-12 (CYANOCOBALAMIN) 500 MCG tablet Take 500 mcg by mouth daily.    [provider]  ?   ? ?Allergies    ?Buprenorphine hcl, Codeine, Morphine, Morphine and related, Tramadol, Adhesive [tape], Doxycycline hyclate, Hydrocodone-acetaminophen, Lactose intolerance (gi), Nucynta [tapentadol], and Oxycodone   ? ?Review of Systems   ?Review of Systems  ?Unable to perform ROS: Mental status change  ?Constitutional:  Positive for fever.  ?Genitourinary:  Positive for dysuria.  ?Skin:  Positive for rash.  ? ?Physical Exam ?Updated Vital Signs ?BP (!) 145/80 (BP Location: Left Arm)   Pulse 99   Temp 99.9 ?F (37.7 ?C) (Axillary)   Resp (!) 27   Ht '5\' 5"'$  (1.651 m)   Wt 58 kg   SpO2 97%   BMI 21.28 kg/m?  ?Physical Exam ?Vitals and nursing note reviewed. Exam conducted with a chaperone present.  ?88 year old female, resting comfortably and in no acute distress. Vital signs are significant for elevated respiratory rate and borderline elevated blood pressure. Oxygen saturation is 97%, which is normal. ?Head is normocephalic and atraumatic. PERRLA, EOMI. Oropharynx is clear.  Mucous brains are dry. ?Neck is nontender and supple without adenopathy or JVD. ?Back is nontender and there is no CVA tenderness. ?Lungs are clear without rales, wheezes, or rhonchi. ?Chest is nontender. ?Heart has regular rate and rhythm without murmur. ?Abdomen is soft, flat, nontender without masses or  hepatosplenomegaly and peristalsis is normoactive. ?Extremities have no cyanosis or edema, full range of motion is present. ?Skin is warm and dry without rash.  Specifically, no perineal rashes seen. ?Neurologic: Drowsy but arousable and able to answer some simple questions, cranial nerves are intact, moves all extremities equally. ? ?ED Results / Procedures / Treatments   ?Labs ?(all labs ordered are listed, but only abnormal results are displayed) ?Labs Reviewed  ?URINALYSIS, ROUTINE W REFLEX MICROSCOPIC - Abnormal; Notable for the following components:  ?    Result Value  ? APPearance CLOUDY (*)   ? pH 9.0 (*)   ? Protein, ur 100 (*)   ? Leukocytes,Ua LARGE (*)   ? WBC, UA >50 (*)   ? Bacteria, UA MANY (*)   ? Non Squamous Epithelial 0-5 (*)   ?  All other components within normal limits  ?BASIC METABOLIC PANEL - Abnormal; Notable for the following components:  ? Potassium 3.2 (*)   ? Glucose, Bld 126 (*)   ? All other components within normal limits  ?CBC - Abnormal; Notable for the following components:  ? RDW 16.0 (*)   ? All other components within normal limits  ?URINE CULTURE  ?CULTURE, BLOOD (ROUTINE X 2)  ?CULTURE, BLOOD (ROUTINE X 2)  ?LACTIC ACID, PLASMA  ?PROTIME-INR  ?APTT  ?LACTIC ACID, PLASMA  ? ? ?EKG ?EKG Interpretation ? ?Date/Time:  Friday September 09 2021 06:49:17 EDT ?Ventricular Rate:  101 ?PR Interval:  143 ?QRS Duration: 72 ?QT Interval:  346 ?QTC Calculation: 449 ?R Axis:   37 ?Text Interpretation: Sinus tachycardia Borderline repolarization abnormality When compared with ECG of 08/10/2021, No significant change was found Confirmed by Delora Fuel ([id number]) on 09/09/2021 7:05:00 AM ? ?Radiology ?DG Chest Port 1 View ? ?Result Date: 09/09/2021 ?CLINICAL DATA:  88 year old female with history of fever, dysuria and confusion. Possible sepsis. EXAM: PORTABLE CHEST 1 VIEW COMPARISON:  Chest x-ray 08/15/2021. FINDINGS: Lung volumes are low. No consolidative airspace disease. No pleural effusions. No  pneumothorax. No pulmonary nodule or mass noted. Pulmonary vasculature and the cardiomediastinal silhouette are within normal limits allowing for patient rotation. Atherosclerosis IMPRESSION: 1. Low lung volumes without

## 2021-09-10 DIAGNOSIS — G9341 Metabolic encephalopathy: Secondary | ICD-10-CM

## 2021-09-10 DIAGNOSIS — N390 Urinary tract infection, site not specified: Secondary | ICD-10-CM | POA: Diagnosis not present

## 2021-09-10 DIAGNOSIS — I10 Essential (primary) hypertension: Secondary | ICD-10-CM | POA: Diagnosis not present

## 2021-09-10 DIAGNOSIS — E876 Hypokalemia: Secondary | ICD-10-CM

## 2021-09-10 DIAGNOSIS — I2510 Atherosclerotic heart disease of native coronary artery without angina pectoris: Secondary | ICD-10-CM | POA: Diagnosis not present

## 2021-09-10 DIAGNOSIS — E039 Hypothyroidism, unspecified: Secondary | ICD-10-CM

## 2021-09-10 DIAGNOSIS — E785 Hyperlipidemia, unspecified: Secondary | ICD-10-CM

## 2021-09-10 DIAGNOSIS — E538 Deficiency of other specified B group vitamins: Secondary | ICD-10-CM

## 2021-09-10 LAB — CBC
HCT: 34.2 % — ABNORMAL LOW (ref 36.0–46.0)
Hemoglobin: 10.9 g/dL — ABNORMAL LOW (ref 12.0–15.0)
MCH: 28.8 pg (ref 26.0–34.0)
MCHC: 31.9 g/dL (ref 30.0–36.0)
MCV: 90.5 fL (ref 80.0–100.0)
Platelets: 224 10*3/uL (ref 150–400)
RBC: 3.78 MIL/uL — ABNORMAL LOW (ref 3.87–5.11)
RDW: 16.2 % — ABNORMAL HIGH (ref 11.5–15.5)
WBC: 6.3 10*3/uL (ref 4.0–10.5)
nRBC: 0 % (ref 0.0–0.2)

## 2021-09-10 LAB — BASIC METABOLIC PANEL
Anion gap: 8 (ref 5–15)
BUN: 13 mg/dL (ref 8–23)
CO2: 27 mmol/L (ref 22–32)
Calcium: 8.5 mg/dL — ABNORMAL LOW (ref 8.9–10.3)
Chloride: 103 mmol/L (ref 98–111)
Creatinine, Ser: 0.68 mg/dL (ref 0.44–1.00)
GFR, Estimated: >60 mL/min (ref >60)
Glucose, Bld: 123 mg/dL — ABNORMAL HIGH (ref 70–99)
Potassium: 2.9 mmol/L — ABNORMAL LOW (ref 3.5–5.1)
Sodium: 138 mmol/L (ref 135–145)

## 2021-09-10 MED ORDER — FOLIC ACID 1 MG PO TABS
1.0000 mg | ORAL_TABLET | Freq: Every day | ORAL | Status: DC
  Administered 2021-09-10 – 2021-09-12 (×2): 1 mg via ORAL
  Filled 2021-09-10 (×3): qty 1

## 2021-09-10 MED ORDER — ENOXAPARIN SODIUM 40 MG/0.4ML IJ SOSY
40.0000 mg | PREFILLED_SYRINGE | INTRAMUSCULAR | Status: DC
  Administered 2021-09-10 – 2021-09-11 (×2): 40 mg via SUBCUTANEOUS
  Filled 2021-09-10 (×2): qty 0.4

## 2021-09-10 MED ORDER — CHLORHEXIDINE GLUCONATE CLOTH 2 % EX PADS
6.0000 | MEDICATED_PAD | Freq: Every day | CUTANEOUS | Status: DC
  Administered 2021-09-10 – 2021-09-12 (×3): 6 via TOPICAL

## 2021-09-10 NOTE — Progress Notes (Addendum)
?PROGRESS NOTE ? ? ? ?Kerri [name]  GYK:[id number] DOB: August 31, 1929 DOA: 09/09/2021 ?PCP: Lavone Orn, MD  ? ?Brief Narrative:  ?88 year old female with history of cluster headaches/migraine, asthma, CAD, chronic back pain, depression, hyperlipidemia, GERD, hypothyroidism, irritable bowel syndrome, metastatic melanoma of left lower extremity with multiple excisions, multiple skin squamous cell carcinoma lesions, osteoporosis, spinal stenosis, recent hospitalization from 08/09/2021-08/21/2021 for hip fractures, multiple punctuated CVAs, ESBL UTI presented on 09/09/2021 with fever, confusion and malodorous urine.  On presentation, she was tachycardic.  UA was suggestive of UTI.  She was started on meropenem.  Chest x-ray showed no acute cardiopulmonary disease.  MRI of brain was negative for any strokes. ? ?Assessment & Plan: ?  ?UTI ?-History of recent ESBL UTI ?-Continue meropenem.  Urine cultures growing more than 100,000 colonies per mL gram-negative rods.  Blood cultures negative so far. ? ?Acute metabolic encephalopathy ?-Possibly from above.  MRI of brain was negative for any new strokes.  Monitor mental status.  Fall precautions.  PT eval. ? ?Hypokalemia ?-Replace.  Repeat a.m. labs ? ?Hypothyroidism ?-Continue levothyroxine ? ?Hypertension ?-continue metoprolol and chlorthalidone.  Monitor blood pressure ? ?Hyperlipidemia ?-continue statin ? ?CAD ?-no chest pain.  Continue Plavix and statin ? ?History of recent acute punctate multifocal strokes ?-As per MRI brain on 08/16/2021.  Neurology had recommended aspirin and Plavix for 21 days then aspirin alone.  Outpatient follow-up with neurology. ? ?Chronic back pain/chronic pain syndrome ?-Continue gabapentin and tapentadol.  Continue lidocaine patch.  Monitor mental status. ?-Outpatient follow-up with PCP/pain management. ? ?Normocytic anemia ?-Hemoglobin stable.  Monitor intermittently. ? ?Goals of care ?-Overall prognosis guarded to poor.  Currently remains full  code. ?-Patient was planned to have outpatient palliative care evaluation at discharge.  We will request palliative care consultation. ? ?Physical deconditioning ?-PT eval ? ?DVT prophylaxis: Lovenox ?Code Status: Full ?Family Communication: Called daughter/[name] on phone, she did not pick up. ?Disposition Plan: ?Status is: Inpatient ?Remains inpatient appropriate because: Of need for IV antibiotics.  PT eval pending ? ? ? ?Consultants: None ? ?Procedures: None ? ?Antimicrobials: Meropenem from 09/09/2021 onwards ? ? ?Subjective: ?Patient seen and examined at bedside.,  Wakes up only very slightly, hardly answers any questions.  No overnight fever, agitation, seizures reported ? ?Objective: ?Vitals:  ? 09/09/21 1600 09/09/21 1945 09/09/21 2328 09/10/21 0500  ?BP: 129/80 132/82 122/72 117/62  ?Pulse: 88 75 77 69  ?Resp: '20 20 16 18  '$ ?Temp: 98.2 ?F (36.8 ?C) 98.6 ?F (37 ?C) 98.4 ?F (36.9 ?C) 98 ?F (36.7 ?C)  ?TempSrc: Oral Oral Oral Oral  ?SpO2: 100% 99% 95% 96%  ?Weight:      ?Height:      ? ? ?Intake/Output Summary (Last 24 hours) at 09/10/2021 1036 ?Last data filed at 09/10/2021 0500 ?Gross per 24 hour  ?Intake 1037.55 ml  ?Output 750 ml  ?Net 287.55 ml  ? ?Filed Weights  ? 09/09/21 0440  ?Weight: 58 kg  ? ? ?Examination: ? ?General exam: Appears calm and comfortable.  Looks chronically ill and deconditioned.  Currently on room air. ?Respiratory system: Bilateral decreased breath sounds at bases ?Cardiovascular system: S1 & S2 heard, Rate controlled ?Gastrointestinal system: Abdomen is nondistended, soft and nontender. Normal bowel sounds heard. ?Extremities: No cyanosis, clubbing, edema  ?Central nervous system: Sleepy, wakes up only very slightly, hardly answers any questions.  No focal neurological deficits. Moving extremities ?Skin: No rashes, lesions or ulcers ?Psychiatry: Could not be assessed because of mental status. ? ? ? ?Data Reviewed:  I have personally reviewed following labs and imaging  studies ? ?CBC: ?Recent Labs  ?Lab 09/09/21 ?9163 09/10/21 ?0340  ?WBC 9.6 6.3  ?HGB 12.0 10.9*  ?HCT 37.5 34.2*  ?MCV 88.9 90.5  ?PLT 287 224  ? ?Basic Metabolic Panel: ?Recent Labs  ?Lab 09/09/21 ?8466 09/09/21 ?5993 09/10/21 ?0340  ?NA 137  --  138  ?K 3.2*  --  2.9*  ?CL 103  --  103  ?CO2 25  --  27  ?GLUCOSE 126*  --  123*  ?BUN 16  --  13  ?CREATININE 0.76  --  0.68  ?CALCIUM 9.0  --  8.5*  ?MG  --  2.0  --   ?PHOS  --  2.9  --   ? ?GFR: ?Estimated Creatinine Clearance: 42.9 mL/min (by C-G formula based on SCr of 0.68 mg/dL). ?Liver Function Tests: ?Recent Labs  ?Lab 09/09/21 ?5701  ?AST 16  ?ALT 8  ?ALKPHOS 126  ?BILITOT 0.6  ?PROT 7.2  ?ALBUMIN 3.5  ? ?No results for input(s): LIPASE, AMYLASE in the last 168 hours. ?No results for input(s): AMMONIA in the last 168 hours. ?Coagulation Profile: ?Recent Labs  ?Lab 09/09/21 ?0533  ?INR 1.1  ? ?Cardiac Enzymes: ?No results for input(s): CKTOTAL, CKMB, CKMBINDEX, TROPONINI in the last 168 hours. ?BNP (last 3 results) ?No results for input(s): PROBNP in the last 8760 hours. ?HbA1C: ?No results for input(s): HGBA1C in the last 72 hours. ?CBG: ?No results for input(s): GLUCAP in the last 168 hours. ?Lipid Profile: ?No results for input(s): CHOL, HDL, LDLCALC, TRIG, CHOLHDL, LDLDIRECT in the last 72 hours. ?Thyroid Function Tests: ?No results for input(s): TSH, T4TOTAL, FREET4, T3FREE, THYROIDAB in the last 72 hours. ?Anemia Panel: ?No results for input(s): VITAMINB12, FOLATE, FERRITIN, TIBC, IRON, RETICCTPCT in the last 72 hours. ?Sepsis Labs: ?Recent Labs  ?Lab 09/09/21 ?0533  ?LATICACIDVEN 1.2  ? ? ?Recent Results (from the past 240 hour(s))  ?Urine Culture     Status: Abnormal (Preliminary result)  ? Collection Time: 09/09/21  4:33 AM  ? Specimen: In/Out Cath Urine  ?Result Value Ref Range Status  ? Specimen Description   Final  ?  IN/OUT CATH URINE ?Performed at [facility], [place] [address] [place], [place] [address] ?  ? Special Requests    Final  ?  NONE ?Performed at [facility], [place] [address] [place], [place] [address] ?  ? Culture >=100,000 COLONIES/mL GRAM NEGATIVE RODS (A)  Final  ? Report Status PENDING  Incomplete  ?Blood Culture (routine x 2)     Status: None (Preliminary result)  ? Collection Time: 09/09/21  5:25 AM  ? Specimen: BLOOD  ?Result Value Ref Range Status  ? Specimen Description   Final  ?  BLOOD LEFT ANTECUBITAL ?Performed at [facility], [address] [place], [place] [address] ?  ? Special Requests   Final  ?  BOTTLES DRAWN AEROBIC AND ANAEROBIC Blood Culture results may not be optimal due to an excessive volume of blood received in culture bottles ?Performed at [facility], [place] [address] [place], [place] [address] ?  ? Culture   Final  ?  NO GROWTH 1 DAY ?Performed at [facility], [place] [address], [place], [place] [address] ?  ? Report Status PENDING  Incomplete  ?Blood Culture (routine x 2)     Status: None (Preliminary result)  ? Collection Time: 09/09/21  5:38 AM  ? Specimen: BLOOD  ?  Result Value Ref Range Status  ? Specimen Description   Final  ?  BLOOD BLOOD RIGHT FOREARM ?Performed at [facility], [place] [address], [place], [place] [address] ?  ? Special Requests   Final  ?  BOTTLES DRAWN AEROBIC AND ANAEROBIC Blood Culture adequate volume ?Performed at [facility], [place] [address] [place], [place] [address] ?  ? Culture   Final  ?  NO GROWTH 1 DAY ?Performed at [facility], [place] [address], [place], [place] [address] ?  ? Report Status PENDING  Incomplete  ?  ? ? ? ? ? ?Radiology Studies: ?MR BRAIN WO CONTRAST ? ?Result Date: 09/09/2021 ?CLINICAL DATA:  Neuro deficit, stroke suspected, forgetfulness EXAM: MRI HEAD WITHOUT CONTRAST TECHNIQUE: Multiplanar, multiecho pulse sequences of the brain and surrounding structures were obtained without intravenous contrast. COMPARISON:  Brain MRI 08/15/2021 FINDINGS:  Brain: There is no acute intracranial hemorrhage, extra-axial fluid collection, or acute infarct. There is moderate global parenchymal volume loss with prominence of the ventricular system and extra-axial CSF spaces. There is a

## 2021-09-11 DIAGNOSIS — R338 Other retention of urine: Secondary | ICD-10-CM | POA: Diagnosis not present

## 2021-09-11 DIAGNOSIS — I2510 Atherosclerotic heart disease of native coronary artery without angina pectoris: Secondary | ICD-10-CM | POA: Diagnosis not present

## 2021-09-11 DIAGNOSIS — I10 Essential (primary) hypertension: Secondary | ICD-10-CM | POA: Diagnosis not present

## 2021-09-11 DIAGNOSIS — E039 Hypothyroidism, unspecified: Secondary | ICD-10-CM | POA: Diagnosis not present

## 2021-09-11 DIAGNOSIS — G9341 Metabolic encephalopathy: Secondary | ICD-10-CM | POA: Diagnosis not present

## 2021-09-11 DIAGNOSIS — E538 Deficiency of other specified B group vitamins: Secondary | ICD-10-CM

## 2021-09-11 DIAGNOSIS — Z515 Encounter for palliative care: Secondary | ICD-10-CM

## 2021-09-11 DIAGNOSIS — Z7189 Other specified counseling: Secondary | ICD-10-CM

## 2021-09-11 DIAGNOSIS — N390 Urinary tract infection, site not specified: Secondary | ICD-10-CM | POA: Diagnosis not present

## 2021-09-11 LAB — COMPREHENSIVE METABOLIC PANEL
ALT: 7 U/L (ref 0–44)
AST: 13 U/L — ABNORMAL LOW (ref 15–41)
Albumin: 2.8 g/dL — ABNORMAL LOW (ref 3.5–5.0)
Alkaline Phosphatase: 87 U/L (ref 38–126)
Anion gap: 7 (ref 5–15)
BUN: 16 mg/dL (ref 8–23)
CO2: 27 mmol/L (ref 22–32)
Calcium: 8.6 mg/dL — ABNORMAL LOW (ref 8.9–10.3)
Chloride: 104 mmol/L (ref 98–111)
Creatinine, Ser: 0.49 mg/dL (ref 0.44–1.00)
GFR, Estimated: >60 mL/min (ref >60)
Glucose, Bld: 123 mg/dL — ABNORMAL HIGH (ref 70–99)
Potassium: 2.8 mmol/L — ABNORMAL LOW (ref 3.5–5.1)
Sodium: 138 mmol/L (ref 135–145)
Total Bilirubin: 0.4 mg/dL (ref 0.3–1.2)
Total Protein: 5.7 g/dL — ABNORMAL LOW (ref 6.5–8.1)

## 2021-09-11 LAB — CBC WITH DIFFERENTIAL/PLATELET
Abs Immature Granulocytes: 0.03 10*3/uL (ref 0.00–0.07)
Basophils Absolute: 0 10*3/uL (ref 0.0–0.1)
Basophils Relative: 1 %
Eosinophils Absolute: 0.1 10*3/uL (ref 0.0–0.5)
Eosinophils Relative: 2 %
HCT: 31.2 % — ABNORMAL LOW (ref 36.0–46.0)
Hemoglobin: 10 g/dL — ABNORMAL LOW (ref 12.0–15.0)
Immature Granulocytes: 1 %
Lymphocytes Relative: 35 %
Lymphs Abs: 2.2 10*3/uL (ref 0.7–4.0)
MCH: 29.2 pg (ref 26.0–34.0)
MCHC: 32.1 g/dL (ref 30.0–36.0)
MCV: 91.2 fL (ref 80.0–100.0)
Monocytes Absolute: 0.8 10*3/uL (ref 0.1–1.0)
Monocytes Relative: 12 %
Neutro Abs: 3.2 10*3/uL (ref 1.7–7.7)
Neutrophils Relative %: 49 %
Platelets: 203 10*3/uL (ref 150–400)
RBC: 3.42 MIL/uL — ABNORMAL LOW (ref 3.87–5.11)
RDW: 15.9 % — ABNORMAL HIGH (ref 11.5–15.5)
WBC: 6.4 10*3/uL (ref 4.0–10.5)
nRBC: 0 % (ref 0.0–0.2)

## 2021-09-11 LAB — URINE CULTURE: Culture: >=100000 — AB

## 2021-09-11 LAB — MAGNESIUM: Magnesium: 1.8 mg/dL (ref 1.7–2.4)

## 2021-09-11 MED ORDER — CEFAZOLIN SODIUM-DEXTROSE 1-4 GM/50ML-% IV SOLN
1.0000 g | Freq: Two times a day (BID) | INTRAVENOUS | Status: DC
  Filled 2021-09-11: qty 50

## 2021-09-11 MED ORDER — POTASSIUM CHLORIDE CRYS ER 20 MEQ PO TBCR
40.0000 meq | EXTENDED_RELEASE_TABLET | ORAL | Status: AC
  Administered 2021-09-11 (×2): 40 meq via ORAL
  Filled 2021-09-11 (×2): qty 2

## 2021-09-11 MED ORDER — COLLAGENASE 250 UNIT/GM EX OINT
TOPICAL_OINTMENT | Freq: Every day | CUTANEOUS | Status: DC
  Filled 2021-09-11: qty 30

## 2021-09-11 MED ORDER — CEFAZOLIN SODIUM-DEXTROSE 1-4 GM/50ML-% IV SOLN
1.0000 g | Freq: Three times a day (TID) | INTRAVENOUS | Status: DC
  Administered 2021-09-11 – 2021-09-12 (×3): 1 g via INTRAVENOUS
  Filled 2021-09-11 (×4): qty 50

## 2021-09-11 NOTE — Consult Note (Signed)
? ?Palliative Care Consult Note  ?                                ?Date: 09/11/2021  ? ?Patient Name: Kerri [name]  ?DOB: Jun 16, 1929  MRN: [id number]  Age / Sex: 88 y.o., female  ?PCP: Lavone Orn, MD ?Referring Physician: Aline August, MD ? ?Reason for Consultation: Establishing goals of care ? ?HPI/Patient Profile: Palliative Care consult requested for goals of care discussion in this 88 y.o. female  with past medical history of chronic back pain, depression, CAD, asthma, migraines, IBS, metastatic melanoma with left lower extremity s/p multiple excisions, osteoporosis, CVA, recent hospitalization (07/2021-4/2/203) after being found down for 3 days due to fall and hip fracture. At that time underwent right IM femoral nailing and discharged to SNF rehab. She was admitted on 09/09/2021 via EMS from home with UTI, fever, dysuria, and altered mental status. Chest x-ray showed no acute abnormalities. Initiated on IV antibiotics (meropenem). Blood cultures negative to date. MRI negative for acute abnormalities.   ? ?Past Medical History:  ?Diagnosis Date  ? Adenomatous colon polyp   ? Asthma   ? Basal cell carcinoma 03/13/2017  ? left forehead - CX3 + cautery + 5FU  ? CAD (coronary artery disease)   ? Chest CTA 10/07: 40% or less pLAD;  ETT 8/09: negative  ? Carotid bruit   ? Chronic back pain   ? Depression   ? Diverticulosis   ? DJD (degenerative joint disease)   ? Dyslipidemia   ? Dysrhythmia   ? pvcs  ? Fatty liver   ? GERD (gastroesophageal reflux disease)   ? HTN (hypertension)   ? Hypothyroidism   ? IBS (irritable bowel syndrome)   ? Internal hemorrhoid   ? Melanoma ([place])   ? metastatic; left leg; s/p multiple excisions  ? Migraine   ? Osteoporosis   ? Spinal stenosis   ? Squamous cell carcinoma of skin 09/02/2015  ? mid chest - CX3 + 5FU  ? Squamous cell carcinoma of skin 11/07/2017  ? left wrist - tx p bx  ? Squamous cell carcinoma of skin 12/11/2017  ? left hand -  tx p bx  ? ? ? ?Subjective:  ? ?This NP Kerri [name] reviewed medical records, received report from team, assessed the patient and then met at the patient's bedside with Kerri [name] to discuss diagnosis, prognosis, GOC, EOL wishes disposition and options. ? ?Patient is sitting upright in bed. Awake, alert, and oriented x3. Denies pain. Is requesting assistance with preparing her hot tea. No family is present. Declines offer to contact daughter expressing she can "speak for herself".  ? ?Concept of Palliative Care was introduced as specialized medical care for people and their families living with serious illness.  It focuses on providing relief from the symptoms and stress of a serious illness.  The goal is to improve quality of life for both the patient and the family. Values and goals of care important to patient and family were attempted to be elicited. ? ?I created space and opportunity for patient and family to explore state of health prior to admission, thoughts, and feelings.  ? ?Kerri [name] shares she is originally from [place] state. She moved to [place] in [place] in where she lived for years prior to relocating to [place]. Her husband was the COO for [employer]. 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 Her husband  is a resident in [place] due to his health. They had 4 children (unfortunately 2 are deceased to cancer and kidney disease). Her daughter Kerri [name] lives in [place] and is a retired Marine scientist and her daughter, Kerri [name] is an attorney in [place]. Patient shares importance of her high life style status and priorities. Shares that her appearance and the care she requires is high end and she does not settle for less. Mentions not looking her age due to multiple face lifts, ongoing botox injections, and fillers. Her previous activity level was important to her as she walked 3-miles daily, swam 1-2 miles 2-3x/week, line danced twice weekly, and also enjoyed playing tennis. Kerri [name]  spent time discussing her past experiences and lifestyle which she intends to continue despite her illness.  ? ?Prior to her fall resulting in a hip fracture she lived in the home alone and was independent of all ADLs. She has a dog "Kerri [name]" who she loves dearly. Is emotional expressing her frustration in tripping over something and falling. States "my demise is all my fault. I am always careful and now look at me!" Emotional support provided. Kerri [name] expresses if she is not able to walk and do for herself she knows her life expectancy will be short as this is important to her and no desire to live if this does not happen.  ? ?We discussed Her current illness and what it means in the larger context of Her on-going co-morbidities. Natural disease trajectory and expectations were discussed. ? ?Kerri [name] verbalized understanding of current illness and co-morbidities. States she is hopeful for some improvement/stability and open to any and all medical treatment. I expressed concerns about her long-term prognosis. She is Episcopalian. States she only refers to God as God not "Jesus". Speaks of her faith and that when her time comes it will come. She is hopeful to see her 89th birthday speaking of previous plans that she once had.  ? ?She was hopeful to return home however knows this is not realistic. She is planning to move in with her daughter, Kerri [name] who is actively cleaning out patient's house. Kerri [name] states she does not and will not discuss worst case scenario and dying. "This is not made to discuss and no person is God to even consider determining what state she is in!" Expresses her mistrust in the system here in the "[place]". Acknowledged her concerns expressing understanding.  ? ?I discussed the importance of continued conversation with family and their medical providers regarding overall plan of care and treatment options, ensuring decisions are within the context of the patients values and GOCs. ? ?Questions and concerns  were addressed.  Patient was encouraged to call with questions or concerns.  PMT will continue to support holistically as needed. ? ?Objective:  ? ?Primary Diagnoses: ?Present on Admission: ? Acute UTI (urinary tract infection) ?   Hypokalemia ? Hypothyroidism ? Hyperlipidemia ? Essential hypertension ? Coronary atherosclerosis ? Acute metabolic encephalopathy ? ? ?Scheduled Meds: ? Chlorhexidine Gluconate Cloth  6 each Topical Daily  ? clopidogrel  75 mg Oral Daily  ? collagenase   Topical Daily  ? enoxaparin (LOVENOX) injection  40 mg Subcutaneous Q24H  ? estradiol  1 Applicatorful Vaginal QODAY  ? folic acid  1 mg Oral Daily  ? gabapentin  100 mg Oral Q8H  ? levothyroxine  75 mcg Oral QAC breakfast  ? lidocaine  1 patch Transdermal Daily  ? loratadine  10 mg Oral Daily  ? metoprolol succinate  12.5  mg Oral Daily  ? polyethylene glycol  8.5 g Oral QHS  ? potassium chloride  40 mEq Oral Q4H  ? rosuvastatin  10 mg Oral QHS  ? tapentadol  50 mg Oral BID  ? tretinoin  1 application. Topical QHS  ? vitamin B-12  500 mcg Oral Daily  ? ? ?Continuous Infusions: ? lactated ringers 50 mL/hr at 09/10/21 1808  ? meropenem (MERREM) IV 1 g (09/11/21 1694)  ? ? ?PRN Meds: ?hydrOXYzine, LORazepam ? ?Allergies  ?Allergen Reactions  ? Buprenorphine Hcl Itching and Other (See Comments)  ?  Happened awhile back- patient noted that any narcotic-based medication makes her have a "bad reaction" ?Other reaction(s): Other ?Happened a while back. Patient noted that any narcotic based medication makes her have a bad reaction. ?Happened a while back. Patient noted that any narcotic based medication makes her have a bad reaction. ?Happened awhile back- patient noted that any narcotic-based medication makes her have a "bad reaction"  ? Codeine Nausea And Vomiting and Other (See Comments)  ?  Patient gets sick to her stomach ?Other reaction(s): nausea  ? Morphine Itching and Other (See Comments)  ?  Happened awhile back- patient noted that any  narcotic-based medication makes her have a "bad reaction" ?Other reaction(s): itching, Other ?Happened a while back. Patient noted that any narcotic based medication makes her have a bad reaction. ?[place]

## 2021-09-11 NOTE — Evaluation (Signed)
Physical Therapy Evaluation ?Patient Details ?Name: Kerri [name] ?MRN: [id number] ?DOB: [birth year] ?Today's Date: 09/11/2021 ? ?History of Present Illness ? 88 year old female with history of cluster headaches/migraine, asthma, CAD, chronic back pain, depression, hyperlipidemia, GERD, hypothyroidism, irritable bowel syndrome, metastatic melanoma of left lower extremity with multiple excisions, multiple skin squamous cell carcinoma lesions, osteoporosis, spinal stenosis, recent hospitalization from 08/09/2021-08/21/2021 for hip fractures, multiple punctuated CVAs, ESBL UTI presented on 09/09/2021 with fever, confusion and malodorous urine.  ?Clinical Impression ? Pt admitted with above diagnosis.  Pt currently with functional limitations due to the deficits listed below (see PT Problem List). Pt will benefit from skilled PT to increase their independence and safety with mobility to allow discharge to the venue listed below.  Pt needed HHA of 2 for SPT with shuffeling feet transfer.  She is planning on going to her daughter's house and reports daughter is currently cleaning out her house.  Would recommend HHPT.  If daughter is unable to care for her, then would recommend SNF. ?   ?   ? ?Recommendations for follow up therapy are one component of a multi-disciplinary discharge planning process, led by the attending physician.  Recommendations may be updated based on patient status, additional functional criteria and insurance authorization. ? ?Follow Up Recommendations Home health PT (or SNF if daughter can't care for pt) ? ?  ?Assistance Recommended at Discharge Frequent or constant Supervision/Assistance  ?Patient can return home with the following ? A lot of help with walking and/or transfers;Assistance with cooking/housework;Help with stairs or ramp for entrance;Direct supervision/assist for medications management ? ?  ?Equipment Recommendations None recommended by PT  ?Recommendations for Other Services ?    ?  ?Functional  Status Assessment Patient has had a recent decline in their functional status and demonstrates the ability to make significant improvements in function in a reasonable and predictable amount of time.  ? ?  ?Precautions / Restrictions Precautions ?Precautions: Fall ?Restrictions ?RLE Weight Bearing: Weight bearing as tolerated  ? ?  ? ?Mobility ? Bed Mobility ?Overal bed mobility: Needs Assistance ?Bed Mobility: Supine to Sit ?  ?  ?Supine to sit: Min assist ?  ?  ?General bed mobility comments: MIN A for trunk, but wanted to try herself first. ?  ? ?Transfers ?Overall transfer level: Needs assistance ?Equipment used: 2 person hand held assist ?  ?Sit to Stand: Min assist, +2 physical assistance ?Stand pivot transfers: Min assist, +2 physical assistance ?  ?  ?  ?  ?General transfer comment: Stood with Hand held MIN A of 2 and did SPT with very small shuffeling steps. ?  ? ?Ambulation/Gait ?  ?  ?  ?  ?  ?  ?  ?General Gait Details: deferred ? ?Stairs ?  ?  ?  ?  ?  ? ?Wheelchair Mobility ?  ? ?Modified Rankin (Stroke Patients Only) ?  ? ?  ? ?Balance Overall balance assessment: History of Falls, Needs assistance ?Sitting-balance support: Feet supported ?Sitting balance-Leahy Scale: Fair ?  ?  ?Standing balance support: Bilateral upper extremity supported ?Standing balance-Leahy Scale: Poor ?  ?  ?  ?  ?  ?  ?  ?  ?  ?  ?  ?  ?   ? ? ? ?Pertinent Vitals/Pain Pain Assessment ?Pain Assessment: No/denies pain  ? ? ?Home Living Family/patient expects to be discharged to:: Private residence ?Living Arrangements: Children ?  ?Type of Home: House ?Home Access: Ramped entrance ?  ?  ?  ?  Home Layout: One level ?Home Equipment: Conservation officer, nature (2 wheels);Cane - single point;Shower seat;Grab bars - tub/shower ?Additional Comments: daughter's house  ?  ?Prior Function Prior Level of Function : Needs assist ?  ?  ?  ?  ?  ?  ?Mobility Comments: Was at rehab for recent hip fx and then went home.  Reports she was walking short  distances with RW. ?  ?  ? ? ?Hand Dominance  ? Dominant Hand: Right ? ?  ?Extremity/Trunk Assessment  ? Upper Extremity Assessment ?Upper Extremity Assessment: Generalized weakness ?  ? ?Lower Extremity Assessment ?Lower Extremity Assessment: Generalized weakness ?  ? ?   ?Communication  ? Communication: No difficulties  ?Cognition Arousal/Alertness: Awake/alert ?Behavior During Therapy: [facility] for tasks assessed/performed ?Overall Cognitive Status: Impaired/Different from baseline ?  ?  ?  ?  ?  ?  ?  ?  ?  ?  ?  ?  ?  ?  ?  ?  ?General Comments: pt quite talkative about everything that is wrong with healthcare system in the [place]. Very anxious about not being able to walk again. Disorganized conversation at times jumping from topic to topic. ?  ?  ? ?  ?General Comments   ? ?  ?Exercises General Exercises - Lower Extremity ?Gluteal Sets: Both, 10 reps ?Long [employer]: Strengthening, Both, 10 reps, Seated ?Hip ABduction/ADduction: Strengthening, Seated, Both, 10 reps ?Hip Flexion/Marching: Strengthening, Both, 10 reps, Seated  ? ?Assessment/Plan  ?  ?PT Assessment Patient needs continued PT services  ?PT Problem List Decreased strength;Decreased range of motion;Decreased activity tolerance;Decreased balance;Decreased mobility;Decreased knowledge of use of DME;Decreased knowledge of precautions ? ?   ?  ?PT Treatment Interventions DME instruction;Gait training;Functional mobility training;Balance training;Therapeutic exercise;Therapeutic activities   ? ?PT Goals (Current goals can be found in the Care Plan section)  ?Acute Rehab PT Goals ?Patient Stated Goal: to go to daughter's house ?PT Goal Formulation: With patient ?Time For Goal Achievement: 09/25/21 ?Potential to Achieve Goals: Fair ? ?  ?Frequency Min 3X/week ?  ? ? ?Co-evaluation   ?  ?  ?  ?  ? ? ?  ?AM-PAC PT "6 Clicks" Mobility  ?Outcome Measure Help needed turning from your back to your side while in a flat bed without using bedrails?: A Little ?Help needed  moving from lying on your back to sitting on the side of a flat bed without using bedrails?: A Little ?Help needed moving to and from a bed to a chair (including a wheelchair)?: A Lot ?Help needed standing up from a chair using your arms (e.g., wheelchair or bedside chair)?: A Little ?Help needed to walk in hospital room?: A Lot ?Help needed climbing 3-5 steps with a railing? : A Lot ?6 Click Score: 15 ? ?  ?End of Session Equipment Utilized During Treatment: Gait belt ?Activity Tolerance: Patient tolerated treatment well ?Patient left: in chair;with call bell/phone within reach ?Nurse Communication: Mobility status ?PT Visit Diagnosis: Muscle weakness (generalized) (M62.81);Difficulty in walking, not elsewhere classified (R26.2) ?  ? ?Time: 1430-1505 ?PT Time Calculation (min) (ACUTE ONLY): 35 min ? ? ?Charges:   PT Evaluation ?$PT Eval Moderate Complexity: 1 Mod ?PT Treatments ?$Therapeutic Activity: 8-22 mins ?  ?   ? ? ?Santiago Glad L. Tamala Julian, PT ? ?09/11/2021 ? ? ?Galen [place] ?09/11/2021, 5:07 PM ? ?

## 2021-09-11 NOTE — Consult Note (Signed)
WOC Nurse Consult Note: ?Reason for Consult:Patient was found down at home after 3 days in late March of this year. Seen on 08/10/21 by by associate and later that week by this writer with a deep tissue pressure injury in evolution. Currently this wound is an an Unstageable Pressure Injury. The initial presentation of this wound as a DTPI makes this progression to a full thickness pressure injury predictable and expected.  ?Urinary incontinence managed in house with an external catheter with suction (PurWick) ?Wound type:Pressure ?Pressure Injury POA: Yes ?Measurement:4cm x 3cm, unable to measure depth due to the presence of nonviable tissue ?Wound QPR:[id number] by the presence of nonviable tissue ?Drainage (amount, consistency, odor) scant ?Periwound:Mild erythema ?Dressing procedure/placement/frequency:Patient is being turned per house protocol; this is adequate as pressure can be removed from the sacrum and both hips are intact.  ?I have added pressure redistribution heel boots. Topical care to the sacral Unstageable pressure injury will be with an enzymatic debriding agent (collagenase/Santyl) applied once daily and PRN soiling of the dressing. ?Continuation of the level of care provided at a [place] is recommended post discharge.  ?[facility] nursing team will not follow, but will remain available to this patient, the nursing and medical teams.  Please re-consult if needed. ?Thanks, ?Maudie Flakes, MSN, RN, [place], [place], CWON-AP, [place]  ?Pager# [phone]  ? ? ? ?  ?

## 2021-09-11 NOTE — Progress Notes (Addendum)
?PROGRESS NOTE ? ? ? ?Kerri [name]  NFA:[id number] DOB: 11-14-1929 DOA: 09/09/2021 ?PCP: Lavone Orn, MD  ? ?Brief Narrative:  ?88 year old female with history of cluster headaches/migraine, asthma, CAD, chronic back pain, depression, hyperlipidemia, GERD, hypothyroidism, irritable bowel syndrome, metastatic melanoma of left lower extremity with multiple excisions, multiple skin squamous cell carcinoma lesions, osteoporosis, spinal stenosis, recent hospitalization from 08/09/2021-08/21/2021 for hip fractures, multiple punctuated CVAs, ESBL UTI presented on 09/09/2021 with fever, confusion and malodorous urine.  On presentation, she was tachycardic.  UA was suggestive of UTI.  She was started on meropenem.  Chest x-ray showed no acute cardiopulmonary disease.  MRI of brain was negative for any strokes. ? ?Assessment & Plan: ?  ?UTI ?-History of recent ESBL UTI ?-Continue meropenem.  Urine cultures growing more than 100,000 colonies per mL gram-negative rods.  Blood cultures negative so far. ? ?Acute metabolic encephalopathy ?-Possibly from above.  MRI of brain was negative for any new strokes.  Monitor mental status.  Fall precautions.  PT eval. ? ?Hypokalemia ?-Replace.  Repeat a.m. labs.  Chlorthalidone discontinued ? ?Hypothyroidism ?-Continue levothyroxine ? ?Hypertension ?-continue metoprolol.  Chlorthalidone discontinued on 09/10/2021.  Blood pressure on the lower side. ? ?Hyperlipidemia ?-continue statin ? ?CAD ?-no chest pain.  Continue Plavix and statin ? ?Folate deficiency ?-Folate level was 4.9 on 08/12/2021.  Started supplementation on 09/10/2021.  Continue. ? ?History of recent acute punctate multifocal strokes ?-As per MRI brain on 08/16/2021.  Neurology had recommended aspirin and Plavix for 21 days then aspirin alone.  Outpatient follow-up with neurology. ? ?Chronic back pain/chronic pain syndrome ?-Continue gabapentin and tapentadol.  Continue lidocaine patch.  Monitor mental status. ?-Outpatient follow-up with  PCP/pain management. ? ?Medial sacral unstageable pressure injury: POA ?-Consult wound care  ? ?Normocytic anemia ?-Hemoglobin stable.  Monitor intermittently. ? ?Goals of care ?-Overall prognosis guarded to poor.  Currently remains full code. ?-Patient was planned to have outpatient palliative care evaluation at discharge.  Inpatient palliative care consultation has been requested. ? ?Physical deconditioning ?-PT eval ? ?DVT prophylaxis: Lovenox ?Code Status: Full ?Family Communication: spoke to [facility] on phone ?Disposition Plan: ?Status is: Inpatient ?Remains inpatient appropriate because: Of need for IV antibiotics.  PT eval pending ? ? ? ?Consultants: None ? ?Procedures: None ? ?Antimicrobials: Meropenem from 09/09/2021 onwards ? ? ?Subjective: ?Patient seen and examined at bedside.  Poor historian.  Hardly participates in any conversation.  No overnight fever, agitation, seizures reported. ? ?Objective: ?Vitals:  ? 09/10/21 0500 09/10/21 1459 09/10/21 2014 09/11/21 0500  ?BP: 117/62 115/71 111/66 110/79  ?Pulse: 69 80 73 72  ?Resp: '18 20 20 19  '$ ?Temp: 98 ?F (36.7 ?C) (!) 97.5 ?F (36.4 ?C) 97.8 ?F (36.6 ?C) 98.7 ?F (37.1 ?C)  ?TempSrc: Oral Oral Oral Oral  ?SpO2: 96% 100% 100% 100%  ?Weight:    57.5 kg  ?Height:      ? ? ?Intake/Output Summary (Last 24 hours) at 09/11/2021 0742 ?Last data filed at 09/10/2021 4696 ?Gross per 24 hour  ?Intake 240 ml  ?Output 550 ml  ?Net -310 ml  ? ? ?Filed Weights  ? 09/09/21 0440 09/11/21 0500  ?Weight: 58 kg 57.5 kg  ? ? ?Examination: ? ?General: On room air.  No distress.  Chronically deconditioned and ill looking. ?ENT/neck: No thyromegaly.  JVD is not elevated  ?respiratory: Decreased breath sounds at bases bilaterally with some crackles; no wheezing  ?CVS: Rate controlled currently; S1-S2 heard  ?abdominal: Soft, nontender, slightly distended; no organomegaly, normal bowel sounds  are heard ?Extremities: Trace lower extremity edema; no cyanosis  ?CNS: Still very slow to respond.   No focal neurologic deficit.  Moves extremities ?Lymph: No obvious lymphadenopathy ?Skin: No obvious ecchymosis/lesions  ?psych: Affect is mostly flat.  Does not participate in conversation much. ?musculoskeletal: No obvious joint swelling/deformity ? ? ? ? ?Data Reviewed: I have personally reviewed following labs and imaging studies ? ?CBC: ?Recent Labs  ?Lab 09/09/21 ?6314 09/10/21 ?9702 09/11/21 ?6378  ?WBC 9.6 6.3 6.4  ?NEUTROABS  --   --  3.2  ?HGB 12.0 10.9* 10.0*  ?HCT 37.5 34.2* 31.2*  ?MCV 88.9 90.5 91.2  ?PLT 287 224 203  ? ? ?Basic Metabolic Panel: ?Recent Labs  ?Lab 09/09/21 ?5885 09/09/21 ?0277 09/10/21 ?4128 09/11/21 ?7867  ?NA 137  --  138 138  ?K 3.2*  --  2.9* 2.8*  ?CL 103  --  103 104  ?CO2 25  --  27 27  ?GLUCOSE 126*  --  123* 123*  ?BUN 16  --  13 16  ?CREATININE 0.76  --  0.68 0.49  ?CALCIUM 9.0  --  8.5* 8.6*  ?MG  --  2.0  --  1.8  ?PHOS  --  2.9  --   --   ? ? ?GFR: ?Estimated Creatinine Clearance: 42.9 mL/min (by C-G formula based on SCr of 0.49 mg/dL). ?Liver Function Tests: ?Recent Labs  ?Lab 09/09/21 ?6720 09/11/21 ?9470  ?AST 16 13*  ?ALT 8 7  ?ALKPHOS 126 87  ?BILITOT 0.6 0.4  ?PROT 7.2 5.7*  ?ALBUMIN 3.5 2.8*  ? ? ?No results for input(s): LIPASE, AMYLASE in the last 168 hours. ?No results for input(s): AMMONIA in the last 168 hours. ?Coagulation Profile: ?Recent Labs  ?Lab 09/09/21 ?0533  ?INR 1.1  ? ? ?Cardiac Enzymes: ?No results for input(s): CKTOTAL, CKMB, CKMBINDEX, TROPONINI in the last 168 hours. ?BNP (last 3 results) ?No results for input(s): PROBNP in the last 8760 hours. ?HbA1C: ?No results for input(s): HGBA1C in the last 72 hours. ?CBG: ?No results for input(s): GLUCAP in the last 168 hours. ?Lipid Profile: ?No results for input(s): CHOL, HDL, LDLCALC, TRIG, CHOLHDL, LDLDIRECT in the last 72 hours. ?Thyroid Function Tests: ?No results for input(s): TSH, T4TOTAL, FREET4, T3FREE, THYROIDAB in the last 72 hours. ?Anemia Panel: ?No results for input(s): VITAMINB12, FOLATE,  FERRITIN, TIBC, IRON, RETICCTPCT in the last 72 hours. ?Sepsis Labs: ?Recent Labs  ?Lab 09/09/21 ?0533  ?LATICACIDVEN 1.2  ? ? ? ?Recent Results (from the past 240 hour(s))  ?Urine Culture     Status: Abnormal (Preliminary result)  ? Collection Time: 09/09/21  4:33 AM  ? Specimen: In/Out Cath Urine  ?Result Value Ref Range Status  ? Specimen Description   Final  ?  IN/OUT CATH URINE ?Performed at [facility], [place] [address], [place], [place] [address] ?  ? Special Requests   Final  ?  NONE ?Performed at [facility], [place] [address], [place], [place] [address] ?  ? Culture >=100,000 COLONIES/mL GRAM NEGATIVE RODS (A)  Final  ? Report Status PENDING  Incomplete  ?Blood Culture (routine x 2)     Status: None (Preliminary result)  ? Collection Time: 09/09/21  5:25 AM  ? Specimen: BLOOD  ?Result Value Ref Range Status  ? Specimen Description   Final  ?  BLOOD LEFT ANTECUBITAL ?Performed at [facility], [place] [address], [place], [place] [address] ?  ? Special Requests   Final  ?  BOTTLES DRAWN AEROBIC AND ANAEROBIC Blood Culture results may not be optimal due to an excessive volume of blood received in culture bottles ?Performed at [facility], [place] [address], [place], [place] [address] ?  ? Culture   Final  ?  NO GROWTH 1 DAY ?Performed at [facility], [place] [address], [place], [place] [address] ?  ? Report Status PENDING  Incomplete  ?Blood Culture (routine x 2)     Status: None (Preliminary result)  ? Collection Time: 09/09/21  5:38 AM  ? Specimen: BLOOD  ?Result Value Ref Range Status  ? Specimen Description   Final  ?  BLOOD BLOOD RIGHT FOREARM ?Performed at [facility], [place] [address], [place], [place] [address] ?  ? Special Requests   Final  ?  BOTTLES DRAWN AEROBIC AND ANAEROBIC Blood Culture adequate volume ?Performed at [facility], [place] [address], [place], [place] [address] ?  ? Culture    Final  ?  NO GROWTH 1 DAY ?Performed at [facility], [place] [address], [place], [place] [address] ?  ? Report Status PENDING  Incomplete  ? ?  ? ? ? ? ? ?Radiology Studies: ?MR BRAIN WO CONTRAST

## 2021-09-11 NOTE — Plan of Care (Signed)
?  Problem: Health Behavior/Discharge Planning: ?Goal: Ability to manage health-related needs will improve ?Outcome: Progressing ?  ?Problem: Clinical Measurements: ?Goal: Ability to maintain clinical measurements within normal limits will improve ?Outcome: Progressing ?Goal: Will remain free from infection ?Outcome: Progressing ?Goal: Diagnostic test results will improve ?Outcome: Progressing ?Goal: Respiratory complications will improve ?Outcome: Progressing ?Goal: Cardiovascular complication will be avoided ?Outcome: Progressing ?  ?Problem: Activity: ?Goal: Risk for activity intolerance will decrease ?Outcome: Progressing ?  ?Problem: Nutrition: ?Goal: Adequate nutrition will be maintained ?Outcome: Progressing ?  ?Problem: Coping: ?Goal: Level of anxiety will decrease ?Outcome: Progressing ?  ?Problem: Elimination: ?Goal: Will not experience complications related to bowel motility ?Outcome: Progressing ?Goal: Will not experience complications related to urinary retention ?Outcome: Progressing ?  ?Problem: Pain Managment: ?Goal: General experience of comfort will improve ?Outcome: Progressing ?  ?Problem: Safety: ?Goal: Ability to remain free from injury will improve ?Outcome: Progressing ?  ?Problem: Skin Integrity: ?Goal: Risk for impaired skin integrity will decrease ?Outcome: Progressing ?  ?Problem: Urinary Elimination: ?Goal: Signs and symptoms of infection will decrease ?Outcome: Progressing ?  ?Problem: Urinary Elimination: ?Goal: Signs and symptoms of infection will decrease ?Outcome: Progressing ?  ?
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
## 2021-09-12 DIAGNOSIS — N390 Urinary tract infection, site not specified: Secondary | ICD-10-CM | POA: Diagnosis not present

## 2021-09-12 DIAGNOSIS — I2510 Atherosclerotic heart disease of native coronary artery without angina pectoris: Secondary | ICD-10-CM | POA: Diagnosis not present

## 2021-09-12 DIAGNOSIS — I10 Essential (primary) hypertension: Secondary | ICD-10-CM | POA: Diagnosis not present

## 2021-09-12 DIAGNOSIS — G9341 Metabolic encephalopathy: Secondary | ICD-10-CM | POA: Diagnosis not present

## 2021-09-12 LAB — BASIC METABOLIC PANEL
Anion gap: 7 (ref 5–15)
BUN: 9 mg/dL (ref 8–23)
CO2: 26 mmol/L (ref 22–32)
Calcium: 8.7 mg/dL — ABNORMAL LOW (ref 8.9–10.3)
Chloride: 106 mmol/L (ref 98–111)
Creatinine, Ser: 0.42 mg/dL — ABNORMAL LOW (ref 0.44–1.00)
GFR, Estimated: >60 mL/min (ref >60)
Glucose, Bld: 107 mg/dL — ABNORMAL HIGH (ref 70–99)
Potassium: 3.7 mmol/L (ref 3.5–5.1)
Sodium: 139 mmol/L (ref 135–145)

## 2021-09-12 LAB — MAGNESIUM: Magnesium: 1.8 mg/dL (ref 1.7–2.4)

## 2021-09-12 MED ORDER — CEPHALEXIN 500 MG PO CAPS
500.0000 mg | ORAL_CAPSULE | Freq: Three times a day (TID) | ORAL | 0 refills | Status: AC
Start: 2021-09-12 — End: 2021-09-16

## 2021-09-12 MED ORDER — ASPIRIN EC 81 MG PO TBEC
81.0000 mg | DELAYED_RELEASE_TABLET | Freq: Every day | ORAL | 0 refills | Status: AC
Start: 2021-09-12 — End: 2021-10-12

## 2021-09-12 MED ORDER — FOLIC ACID 1 MG PO TABS: 1.0000 mg | ORAL_TABLET | Freq: Every day | ORAL | 0 refills | Status: DC

## 2021-09-12 MED ORDER — CORDRAN 4 MCG/SQCM EX TAPE: 1.0000 | MEDICATED_TAPE | Freq: Every day | CUTANEOUS | Status: DC | PRN

## 2021-09-12 MED ORDER — METOPROLOL SUCCINATE ER 25 MG PO TB24
12.5000 mg | ORAL_TABLET | Freq: Every day | ORAL | 0 refills | Status: AC
Start: 2021-09-12 — End: ?

## 2021-09-12 MED ORDER — COLLAGENASE 250 UNIT/GM EX OINT: TOPICAL_OINTMENT | Freq: Every day | CUTANEOUS | 0 refills | Status: DC

## 2021-09-12 MED ORDER — TAPENTADOL HCL 50 MG PO TABS: 50.0000 mg | ORAL_TABLET | Freq: Two times a day (BID) | ORAL | Status: AC

## 2021-09-12 NOTE — Care Management Important Message (Signed)
Important Message ? ?Patient Details IM Letter placed in Patients room. ?Name: Setareh Rom ?MRN: [id number] ?Date of Birth: 1929-10-06 ? ? ?Medicare Important Message Given:  Yes ? ? ? ? ?[name] ?09/12/2021, 11:43 AM ?

## 2021-09-12 NOTE — TOC Progression Note (Signed)
Transition of Care (TOC) - Progression Note  ? ? ?Patient Details  ?Name: Kerri [name] ?MRN: [id number] ?Date of Birth: [birth year] ? ?Transition of Care (TOC) CM/SW Contact  ?[name], RN ?Phone Number: ?09/12/2021, 11:24 AM ? ?Clinical Narrative:    ?Spoke with pt's daughter [name] concerning discharge plans. [name] selected [facility]. Referral given to in house rep with [facility] ? ? ?Expected Discharge Plan: [place] ?Barriers to Discharge: Continued Medical Work up ? ?Expected Discharge Plan and Services ?Expected Discharge Plan: [place] ?  ?  ?  ?Living arrangements for the past 2 months: [place], [place] ?Expected Discharge Date: 09/12/21               ?  ?  ?  ?  ?  ?  ?  ?  ?  ?  ? ? ?Social Determinants of Health (SDOH) Interventions ?  ? ?Readmission Risk Interventions ? ?  09/11/2021  ?  2:25 PM  ?Readmission Risk Prevention Plan  ?Transportation Screening Complete  ?PCP or Specialist Appt within 3-5 Days Complete  ?[place] or Home Care Consult Complete  ?Social Work Consult for [place] Planning/Counseling Complete  ?Palliative Care Screening Complete  ? ? ?

## 2021-09-12 NOTE — Discharge Summary (Signed)
Physician Discharge Summary  ?Kerri [name] DUK:[id number] DOB: September 11, 1929 DOA: 09/09/2021 ? ?PCP: Lavone Orn, MD ? ?Admit date: 09/09/2021 ?Discharge date: 09/12/2021 ? ?Admitted From: Home ?Disposition: Home ? ?Recommendations for Outpatient Follow-up:  ?Follow up with PCP in 1 week with repeat CBC/BMP ?Outpatient follow-up with urology ?Outpatient follow-up with neurology ?Follow up in ED if symptoms worsen or new appear ? ? ?Home Health: No ?Equipment/Devices: Foley catheter ?Discharge Condition: Guarded  ?CODE STATUS: DNR ?Diet recommendation: Heart healthy ? ?Brief/Interim Summary: ?88 year old female with history of cluster headaches/migraine, asthma, CAD, chronic back pain, depression, hyperlipidemia, GERD, hypothyroidism, irritable bowel syndrome, metastatic melanoma of left lower extremity with multiple excisions, multiple skin squamous cell carcinoma lesions, osteoporosis, spinal stenosis, recent hospitalization from 08/09/2021-08/21/2021 for hip fractures, multiple punctuated CVAs, ESBL UTI presented on 09/09/2021 with fever, confusion and malodorous urine.  On presentation, she was tachycardic.  UA was suggestive of UTI.  She was started on meropenem.  Chest x-ray showed no acute cardiopulmonary disease.  MRI of brain was negative for any strokes.  Given the hospitalizing, her condition has improved.  Urine culture grew Proteus.  PT recommended home health PT.  She will be discharged home on oral Keflex today with home health PT. ? ?Discharge Diagnoses:  ? ?UTI ?Acute urinary retention ?-History of recent ESBL UTI ?-Initially treated with meropenem but subsequently switched to Ancef.  Urine cultures grew Proteus.  Blood cultures negative so far. ?-Discharge home today on 4 more days of oral Keflex ?-Patient had Foley catheter placed on admission for urinary retention.  Daughter states that she will have her mother seen by urology next week and is requesting the Foley catheter to be continued upon discharge.  I  explained the risks of continuing Foley catheter including another UTI and she is aware of this.  Recommend outpatient urology evaluation at earliest convenience.  If blood pressure improves, patient might benefit from Flomax to be started. ?  ?Acute metabolic encephalopathy ?-Possibly from above.  MRI of brain was negative for any new strokes.  Mental status has much improved and possibly back to baseline.  PT recommends home health PT which will be arranged. ? ?Hypokalemia ?-Improved.  Chlorthalidone discontinued ? ?Hypothyroidism ?-Continue levothyroxine ? ?Hypertension ?-Low-dose metoprolol was started during this hospitalization which will be continued.  Chlorthalidone discontinued on 09/10/2021.  Blood pressure currently on the lower side.  Outpatient follow-up. ? ?Hyperlipidemia ?-continue statin ? ?CAD ?-no chest pain.  Continue aspirin and statin. ?  ?Folate deficiency ?-Folate level was 4.9 on 08/12/2021.  Started supplementation on 09/10/2021.  Continue. ?  ?History of recent acute punctate multifocal strokes ?-As per MRI brain on 08/16/2021.  Neurology had recommended aspirin and Plavix for 21 days then aspirin alone.  Patient has completed 3 weeks of aspirin.  Continue aspirin 81 mg daily.  Outpatient follow-up with neurology. ?  ?Chronic back pain/chronic pain syndrome ?-Continue gabapentin and tapentadol.  Continue lidocaine patch.  Monitor mental status. ?-Outpatient follow-up with PCP/pain management. ?  ?Medial sacral unstageable pressure injury: POA ?-Continue wound care as per wound care consult recommendations.   ? ?Normocytic anemia ?-Hemoglobin stable.  Outpatient follow-up. ? ?Goals of care ?-Overall prognosis guarded to poor.  ?-Perative care consultation appreciated  ?-Patient is now agreeable for DNR.  I have changed the CODE STATUS to DNR  ? ?physical deconditioning ?-Will need home health PT ? ? ?Discharge Instructions ? ?Discharge Instructions   ? ? Diet - low sodium heart healthy    Complete by: As directed ?  ?  Discharge wound care:   Complete by: As directed ?  ? As per wound care consult recommendations: Wound care to Unstageable pressure injury to sacrum (POA):  Cleanse with NS pat gently dry. Cover with 1/8 inch layer of collagenase/Santyl top with saline moistened gauze (opened), dry gauze and sacral silicone foam.  Turn patient side to side and minimize time in the supine position.  ? Increase activity slowly   Complete by: As directed ?  ? ?  ? ?Allergies as of 09/12/2021   ? ?   Reactions  ? Buprenorphine Hcl Itching, Other (See Comments)  ? Happened awhile back- patient noted that any narcotic-based medication makes her have a "bad reaction" ?Other reaction(s): Other ?Happened a while back. Patient noted that any narcotic based medication makes her have a bad reaction. ?Happened a while back. Patient noted that any narcotic based medication makes her have a bad reaction. ?Happened awhile back- patient noted that any narcotic-based medication makes her have a "bad reaction"  ? Codeine Nausea And Vomiting, Other (See Comments)  ? Patient gets sick to her stomach ?Other reaction(s): nausea  ? Morphine Itching, Other (See Comments)  ? Happened awhile back- patient noted that any narcotic-based medication makes her have a "bad reaction" ?Other reaction(s): itching, Other ?Happened a while back. Patient noted that any narcotic based medication makes her have a bad reaction. ?Happened awhile back- patient noted that any narcotic-based medication makes her have a "bad reaction"  ? Morphine And Related Itching, Other (See Comments)  ? Happened awhile back- patient noted that any narcotic-based medication makes her have a "bad reaction"  ? Tramadol Nausea And Vomiting, Other (See Comments)  ? Confusion and severe headaches, also  ? Adhesive [tape] Other (See Comments)  ? Tears the skin  ? Doxycycline Hyclate Other (See Comments)  ? Makes the patient "ill" ?Other reaction(s): ill, Other ?Makes  the patient "ill"  ? Hydrocodone-acetaminophen Nausea And Vomiting  ? Other reaction(s): Other  ? Other Other (See Comments)  ? Other reaction(s): headache, dizzy and nausea ?Other reaction(s): Other  ? Oxycodone Hcl   ? Other reaction(s): Unknown  ? Tapentadol Other (See Comments)  ? Dizziness with doses higher than 50 mg twice a day ?Other reaction(s): Other ?Dizziness with doses higher than 50 mg twice a day  ? Tapentadol Hcl   ? Other reaction(s): Other  ? Tramadol Hcl Other (See Comments)  ? Other reaction(s): nausea, vomiting  ? Hydrocodone-acetaminophen Nausea And Vomiting  ? Lactose Intolerance (gi) Nausea Only, Other (See Comments)  ? GI upset  ? Oxycodone Nausea And Vomiting  ? Other reaction(s): nausea, vomiting  ? ?  ? ?  ?Medication List  ?  ? ?STOP taking these medications   ? ?chlorthalidone 25 MG tablet ?Commonly known as: HYGROTON ?  ?clopidogrel 75 MG tablet ?Commonly known as: PLAVIX ?  ?diphenhydrAMINE 25 MG tablet ?Commonly known as: BENADRYL ?  ? ?  ? ?TAKE these medications   ? ?acetaminophen 500 MG tablet ?Commonly known as: TYLENOL ?Take 500 mg by mouth 3 (three) times daily. ?  ?alendronate 70 MG tablet ?Commonly known as: FOSAMAX ?Take 70 mg by mouth every Sunday. ?  ?aspirin EC 81 MG tablet ?Take 1 tablet (81 mg total) by mouth daily. Swallow whole. ?  ?Biotin 5 MG Caps ?Take 5 mg by mouth daily. ?  ?calcium carbonate 1250 (500 Ca) MG chewable tablet ?Commonly known as: OS-CAL ?Chew 2 tablets by mouth daily. ?  ?cephALEXin 500 MG  capsule ?Commonly known as: KEFLEX ?Take 1 capsule (500 mg total) by mouth 3 (three) times daily for 4 days. ?  ?collagenase 250 UNIT/GM ointment ?Commonly known as: SANTYL ?Apply topically daily. Apply to sacral Unstageable Pressure injury in a 1/8 inch layer once daily and PRN soiling of dressing indivating replacement ?  ?Cordran 4 MCG/SQCM Tape ?Generic drug: Flurandrenolide ?Apply 1 each topically daily as needed (eczema dermatitis). ?  ?erythromycin  ophthalmic ointment ?Place 1 application. into both eyes daily as needed (eye infections). ?  ?estradiol 0.1 MG/GM vaginal cream ?Commonly known as: ESTRACE ?Place 1 Applicatorful vaginally every other day. ?  ?ferrous

## 2021-09-12 NOTE — Evaluation (Signed)
Occupational Therapy Evaluation ?Patient Details ?Name: Kerri [name] ?MRN: [id number] ?DOB: [birth year] ?Today's Date: 09/12/2021 ? ? ?History of Present Illness 88 year old female with history of cluster headaches/migraine, asthma, CAD, chronic back pain, depression, hyperlipidemia, GERD, hypothyroidism, irritable bowel syndrome, metastatic melanoma of left lower extremity with multiple excisions, multiple skin squamous cell carcinoma lesions, osteoporosis, spinal stenosis, recent hospitalization from 08/09/2021-08/21/2021 for hip fractures, multiple punctuated CVAs, ESBL UTI presented on 09/09/2021 with fever, confusion and malodorous urine.  ? ?Clinical Impression ?  ?Patient is a 88 year old female who was admitted for above. Patient was noted to have increased pain, decreased functional activity tolerance, decreased endurance, decreased standing balance, decreased memory, decreased safety awareness impacting participation in ADLs. Patient would continue to benefit from skilled OT services at this time while admitted and after d/c to address noted deficits in order to improve overall safety and independence in ADLs.  ?  ?   ? ?Recommendations for follow up therapy are one component of a multi-disciplinary discharge planning process, led by the attending physician.  Recommendations may be updated based on patient status, additional functional criteria and insurance authorization.  ? ?Follow Up Recommendations ? Home health OT  ?  ?Assistance Recommended at Discharge Frequent or constant Supervision/Assistance  ?Patient can return home with the following Assistance with cooking/housework;Assist for transportation;Help with stairs or ramp for entrance;A lot of help with walking and/or transfers;A lot of help with bathing/dressing/bathroom;Direct supervision/assist for financial management;Direct supervision/assist for medications management ? ?  ?Functional Status Assessment ? Patient has had a recent decline in their  functional status and demonstrates the ability to make significant improvements in function in a reasonable and predictable amount of time.  ?Equipment Recommendations ? BSC/3in1  ?  ?Recommendations for Other Services   ? ? ?  ?Precautions / Restrictions Precautions ?Precautions: Fall ?Restrictions ?Weight Bearing Restrictions: No ?RLE Weight Bearing: Weight bearing as tolerated  ? ?  ? ?Mobility Bed Mobility ?Overal bed mobility: Needs Assistance ?Bed Mobility: Supine to Sit ?  ?  ?Supine to sit: Supervision, HOB elevated ?  ?  ?  ?  ? ?Transfers ?  ?  ?  ?  ?  ?  ?  ?  ?  ?  ?  ? ?  ?Balance Overall balance assessment: History of Falls, Needs assistance ?Sitting-balance support: Feet supported ?Sitting balance-Leahy Scale: Fair ?  ?  ?Standing balance support: Bilateral upper extremity supported ?Standing balance-Leahy Scale: Poor ?  ?  ?  ?  ?  ?  ?  ?  ?  ?  ?  ?  ?   ? ?ADL either performed or assessed with clinical judgement  ? ?ADL Overall ADL's : Needs assistance/impaired ?Eating/Feeding: Sitting;Set up ?  ?Grooming: Sitting;Wash/dry face;Wash/dry hands;Set up ?Grooming Details (indicate cue type and reason): sitting EOB ?Upper Body Bathing: Set up;Sitting ?Upper Body Bathing Details (indicate cue type and reason): EOB ?Lower Body Bathing: Moderate assistance;Sitting/lateral leans ?Lower Body Bathing Details (indicate cue type and reason): sitting EOB. patient declined to use RW for standing with inaiblity to particiapte in ADls in standing with need for BUE support on objects in room ?Upper Body Dressing : Set up;Sitting ?  ?Lower Body Dressing: Maximal assistance;Sit to/from stand;Sitting/lateral leans ?Lower Body Dressing Details (indicate cue type and reason): patient attempted to decline the gripper socks at first until redirected with education ?Toilet Transfer: Moderate assistance ?Toilet Transfer Details (indicate cue type and reason): patient was mod A to transfer from edge of bed to  recliner with  patient reaching out to all objects with HHA to transfer. patient forcefully declined to use walker despite education on how it would provide support and stability. patient reported " i like to do things my way"/ ?Toileting- Clothing Manipulation and Hygiene: Total assistance;Sit to/from stand ?  ?  ?  ?  ?   ? ? ? ?Vision   ?   ?   ?Perception   ?  ?Praxis   ?  ? ?Pertinent Vitals/Pain Pain Assessment ?Pain Assessment: Faces ?Faces Pain Scale: Hurts a little bit ?Pain Location: back and hip with mobiliy ?Pain Descriptors / Indicators: Guarding, Discomfort ?Pain Intervention(s): Monitored during session  ? ? ? ?Hand Dominance Right ?  ?Extremity/Trunk Assessment Upper Extremity Assessment ?Upper Extremity Assessment: Generalized weakness ?  ?Lower Extremity Assessment ?Lower Extremity Assessment: Defer to PT evaluation ?  ?Cervical / Trunk Assessment ?Cervical / Trunk Assessment: Kyphotic ?  ?Communication Communication ?Communication: No difficulties ?  ?Cognition Arousal/Alertness: Awake/alert ?  ?Overall Cognitive Status: Impaired/Different from baseline ?  ?  ?  ?  ?  ?  ?  ?  ?  ?  ?  ?  ?  ?  ?  ?  ?General Comments: patient was noted to have particular views on how session should be run. patient had various negative comments to make about healthcare and previous MDs. patient was resistive to education about safety. ?  ?  ?General Comments    ? ?  ?Exercises   ?  ?Shoulder Instructions    ? ? ?Home Living Family/patient expects to be discharged to:: Private residence ?Living Arrangements: Children ?Available Help at Discharge: Family ?Type of Home: House ?Home Access: Ramped entrance ?  ?  ?Home Layout: One level ?  ?  ?Bathroom Shower/Tub: Walk-in shower ?  ?Bathroom Toilet: Handicapped height ?  ?  ?Home Equipment: Conservation officer, nature (2 wheels);Cane - single point;Shower seat;Grab bars - tub/shower ?  ?Additional Comments: daughter's house ?  ? ?  ?Prior Functioning/Environment   ?  ?  ?  ?  ?  ?  ?  ?ADLs  Comments: patient plans to d/c to daughters house with caregiver and family support. ?  ? ?  ?  ?OT Problem List: Decreased strength;Decreased activity tolerance;Impaired balance (sitting and/or standing);Decreased safety awareness;Pain ?  ?   ?OT Treatment/Interventions: Self-care/ADL training;DME and/or AE instruction;Therapeutic activities;Patient/family education;Balance training  ?  ?OT Goals(Current goals can be found in the care plan section) Acute Rehab OT Goals ?Patient Stated Goal: to go to daughters house ?OT Goal Formulation: With patient/family ?Time For Goal Achievement: 09/26/21 ?Potential to Achieve Goals: Fair  ?OT Frequency: Min 2X/week ?  ? ?Co-evaluation   ?  ?  ?  ?  ? ?  ?AM-PAC OT "6 Clicks" Daily Activity     ?Outcome Measure Help from another person eating meals?: A Little ?Help from another person taking care of personal grooming?: A Little ?Help from another person toileting, which includes using toliet, bedpan, or urinal?: A Lot ?Help from another person bathing (including washing, rinsing, drying)?: A Lot ?Help from another person to put on and taking off regular upper body clothing?: A Little ?Help from another person to put on and taking off regular lower body clothing?: A Lot ?6 Click Score: 15 ?  ?End of Session Equipment Utilized During Treatment: Gait belt ?Nurse Communication: Mobility status ? ?Activity Tolerance: Patient tolerated treatment well ?Patient left: in chair;with call bell/phone within reach ? ?OT Visit Diagnosis: Unsteadiness  on feet (R26.81);Other abnormalities of gait and mobility (R26.89);Muscle weakness (generalized) (M62.81);History of falling (Z91.81)  ?              ?Time: 1660-6004 ?OT Time Calculation (min): 32 min ?Charges:  OT General Charges ?$OT Visit: 1 Visit ?OT Evaluation ?$OT Eval Low Complexity: 1 Low ?OT Treatments ?$Self Care/Home Management : 8-22 mins ? ?Laylamarie Meuser OTR/L, MS ?Acute Rehabilitation Department ?Office# [phone] ?Pager#  [phone] ? ? ?Feliz Beam Oland Arquette ?09/12/2021, 10:34 AM ?

## 2021-09-13 DIAGNOSIS — R339 Retention of urine, unspecified: Secondary | ICD-10-CM | POA: Diagnosis not present

## 2021-09-13 DIAGNOSIS — I10 Essential (primary) hypertension: Secondary | ICD-10-CM | POA: Diagnosis not present

## 2021-09-13 DIAGNOSIS — M80051D Age-related osteoporosis with current pathological fracture, right femur, subsequent encounter for fracture with routine healing: Secondary | ICD-10-CM | POA: Diagnosis not present

## 2021-09-13 DIAGNOSIS — G8929 Other chronic pain: Secondary | ICD-10-CM | POA: Diagnosis not present

## 2021-09-13 DIAGNOSIS — H02401 Unspecified ptosis of right eyelid: Secondary | ICD-10-CM | POA: Diagnosis not present

## 2021-09-13 DIAGNOSIS — J45909 Unspecified asthma, uncomplicated: Secondary | ICD-10-CM | POA: Diagnosis not present

## 2021-09-13 DIAGNOSIS — M549 Dorsalgia, unspecified: Secondary | ICD-10-CM | POA: Diagnosis not present

## 2021-09-13 DIAGNOSIS — E039 Hypothyroidism, unspecified: Secondary | ICD-10-CM | POA: Diagnosis not present

## 2021-09-13 DIAGNOSIS — L89610 Pressure ulcer of right heel, unstageable: Secondary | ICD-10-CM | POA: Diagnosis not present

## 2021-09-13 DIAGNOSIS — B964 Proteus (mirabilis) (morganii) as the cause of diseases classified elsewhere: Secondary | ICD-10-CM | POA: Diagnosis not present

## 2021-09-13 DIAGNOSIS — I2510 Atherosclerotic heart disease of native coronary artery without angina pectoris: Secondary | ICD-10-CM | POA: Diagnosis not present

## 2021-09-13 DIAGNOSIS — E538 Deficiency of other specified B group vitamins: Secondary | ICD-10-CM | POA: Diagnosis not present

## 2021-09-13 DIAGNOSIS — G9341 Metabolic encephalopathy: Secondary | ICD-10-CM | POA: Diagnosis not present

## 2021-09-13 DIAGNOSIS — G44009 Cluster headache syndrome, unspecified, not intractable: Secondary | ICD-10-CM | POA: Diagnosis not present

## 2021-09-13 DIAGNOSIS — N390 Urinary tract infection, site not specified: Secondary | ICD-10-CM | POA: Diagnosis not present

## 2021-09-13 DIAGNOSIS — L89150 Pressure ulcer of sacral region, unstageable: Secondary | ICD-10-CM | POA: Diagnosis not present

## 2021-09-14 DIAGNOSIS — Z09 Encounter for follow-up examination after completed treatment for conditions other than malignant neoplasm: Secondary | ICD-10-CM | POA: Diagnosis not present

## 2021-09-14 DIAGNOSIS — Z978 Presence of other specified devices: Secondary | ICD-10-CM | POA: Diagnosis not present

## 2021-09-14 DIAGNOSIS — N3281 Overactive bladder: Secondary | ICD-10-CM | POA: Diagnosis not present

## 2021-09-14 DIAGNOSIS — R339 Retention of urine, unspecified: Secondary | ICD-10-CM | POA: Diagnosis not present

## 2021-09-14 LAB — CULTURE, BLOOD (ROUTINE X 2)
Culture: NO GROWTH
Culture: NO GROWTH
Special Requests: ADEQUATE

## 2021-09-26 DIAGNOSIS — S72144D Nondisplaced intertrochanteric fracture of right femur, subsequent encounter for closed fracture with routine healing: Secondary | ICD-10-CM | POA: Diagnosis not present

## 2021-09-29 DIAGNOSIS — N390 Urinary tract infection, site not specified: Secondary | ICD-10-CM | POA: Diagnosis not present

## 2021-09-29 DIAGNOSIS — Z8639 Personal history of other endocrine, nutritional and metabolic disease: Secondary | ICD-10-CM | POA: Diagnosis not present

## 2021-09-29 DIAGNOSIS — E039 Hypothyroidism, unspecified: Secondary | ICD-10-CM | POA: Diagnosis not present

## 2021-09-29 DIAGNOSIS — Z7189 Other specified counseling: Secondary | ICD-10-CM | POA: Diagnosis not present

## 2021-09-29 DIAGNOSIS — Z87440 Personal history of urinary (tract) infections: Secondary | ICD-10-CM | POA: Diagnosis not present

## 2021-09-29 DIAGNOSIS — Z978 Presence of other specified devices: Secondary | ICD-10-CM | POA: Diagnosis not present

## 2021-10-12 DIAGNOSIS — R339 Retention of urine, unspecified: Secondary | ICD-10-CM | POA: Diagnosis not present

## 2021-10-12 DIAGNOSIS — Z978 Presence of other specified devices: Secondary | ICD-10-CM | POA: Diagnosis not present

## 2021-10-12 DIAGNOSIS — N2889 Other specified disorders of kidney and ureter: Secondary | ICD-10-CM | POA: Diagnosis not present

## 2021-10-12 DIAGNOSIS — N3281 Overactive bladder: Secondary | ICD-10-CM | POA: Diagnosis not present

## 2021-10-13 DIAGNOSIS — G8929 Other chronic pain: Secondary | ICD-10-CM | POA: Diagnosis not present

## 2021-10-13 DIAGNOSIS — M549 Dorsalgia, unspecified: Secondary | ICD-10-CM | POA: Diagnosis not present

## 2021-10-13 DIAGNOSIS — M80051D Age-related osteoporosis with current pathological fracture, right femur, subsequent encounter for fracture with routine healing: Secondary | ICD-10-CM | POA: Diagnosis not present

## 2021-10-13 DIAGNOSIS — L89150 Pressure ulcer of sacral region, unstageable: Secondary | ICD-10-CM | POA: Diagnosis not present

## 2021-10-13 DIAGNOSIS — G44009 Cluster headache syndrome, unspecified, not intractable: Secondary | ICD-10-CM | POA: Diagnosis not present

## 2021-10-13 DIAGNOSIS — I10 Essential (primary) hypertension: Secondary | ICD-10-CM | POA: Diagnosis not present

## 2021-10-13 DIAGNOSIS — E039 Hypothyroidism, unspecified: Secondary | ICD-10-CM | POA: Diagnosis not present

## 2021-10-13 DIAGNOSIS — H02401 Unspecified ptosis of right eyelid: Secondary | ICD-10-CM | POA: Diagnosis not present

## 2021-10-13 DIAGNOSIS — R339 Retention of urine, unspecified: Secondary | ICD-10-CM | POA: Diagnosis not present

## 2021-10-13 DIAGNOSIS — I2510 Atherosclerotic heart disease of native coronary artery without angina pectoris: Secondary | ICD-10-CM | POA: Diagnosis not present

## 2021-10-13 DIAGNOSIS — N390 Urinary tract infection, site not specified: Secondary | ICD-10-CM | POA: Diagnosis not present

## 2021-10-13 DIAGNOSIS — B964 Proteus (mirabilis) (morganii) as the cause of diseases classified elsewhere: Secondary | ICD-10-CM | POA: Diagnosis not present

## 2021-10-13 DIAGNOSIS — J45909 Unspecified asthma, uncomplicated: Secondary | ICD-10-CM | POA: Diagnosis not present

## 2021-10-13 DIAGNOSIS — E538 Deficiency of other specified B group vitamins: Secondary | ICD-10-CM | POA: Diagnosis not present

## 2021-10-13 DIAGNOSIS — L89610 Pressure ulcer of right heel, unstageable: Secondary | ICD-10-CM | POA: Diagnosis not present

## 2021-10-13 DIAGNOSIS — G9341 Metabolic encephalopathy: Secondary | ICD-10-CM | POA: Diagnosis not present

## 2021-10-21 DIAGNOSIS — R800 Isolated proteinuria: Secondary | ICD-10-CM | POA: Diagnosis not present

## 2021-10-21 DIAGNOSIS — R338 Other retention of urine: Secondary | ICD-10-CM | POA: Diagnosis not present

## 2021-10-21 DIAGNOSIS — N3281 Overactive bladder: Secondary | ICD-10-CM | POA: Diagnosis not present

## 2021-10-21 DIAGNOSIS — R339 Retention of urine, unspecified: Secondary | ICD-10-CM | POA: Diagnosis not present

## 2021-10-21 DIAGNOSIS — R3129 Other microscopic hematuria: Secondary | ICD-10-CM | POA: Diagnosis not present

## 2021-10-21 DIAGNOSIS — Z978 Presence of other specified devices: Secondary | ICD-10-CM | POA: Diagnosis not present

## 2021-10-21 DIAGNOSIS — R82998 Other abnormal findings in urine: Secondary | ICD-10-CM | POA: Diagnosis not present

## 2021-10-21 DIAGNOSIS — N952 Postmenopausal atrophic vaginitis: Secondary | ICD-10-CM | POA: Diagnosis not present

## 2021-10-21 DIAGNOSIS — Z87440 Personal history of urinary (tract) infections: Secondary | ICD-10-CM | POA: Diagnosis not present

## 2021-10-31 DIAGNOSIS — R338 Other retention of urine: Secondary | ICD-10-CM | POA: Diagnosis not present

## 2021-11-03 NOTE — Progress Notes (Deleted)
Cardiology Office Note   Date:  11/03/2021   ID:  Kerri [name], DOB 04-06-1930, MRN [id number]  PCP:  Lavone Orn, MD  Cardiologist:   Minus Breeding, MD   No chief complaint on file.     History of Present Illness: Kerri [name] is a 88 y.o. female who presents for ongoing assessment and management of hypertension and palpitations.  ***   ***  with other history to include cluster headaches.  She has no new acute cardiovascular complaints.  She is keeping her blood pressure diary and her blood pressures have actually been well controlled.  It was running low but it actually is improved now.  She is not having any presyncope or syncope.  Her biggest problems have been orthopedic he is having lots of back pain and requiring injections.  Past Medical History:  Diagnosis Date   Adenomatous colon polyp    Asthma    Basal cell carcinoma 03/13/2017   left forehead - CX3 + cautery + 5FU   CAD (coronary artery disease)    Chest CTA 10/07: 40% or less pLAD;  ETT 8/09: negative   Carotid bruit    Chronic back pain    Depression    Diverticulosis    DJD (degenerative joint disease)    Dyslipidemia    Dysrhythmia    pvcs   Fatty liver    GERD (gastroesophageal reflux disease)    HTN (hypertension)    Hypothyroidism    IBS (irritable bowel syndrome)    Internal hemorrhoid    Melanoma ([place])    metastatic; left leg; s/p multiple excisions   Migraine    Osteoporosis    Spinal stenosis    Squamous cell carcinoma of skin 09/02/2015   mid chest - CX3 + 5FU   Squamous cell carcinoma of skin 11/07/2017   left wrist - tx p bx   Squamous cell carcinoma of skin 12/11/2017   left hand - tx p bx    Past Surgical History:  Procedure Laterality Date   BACK SURGERY     BLEPHAROPLASTY     BUNIONECTOMY WITH HAMMERTOE RECONSTRUCTION Right 01/16/2013   Procedure: RIGHT Barbie Banner OSTEOTOMY WITH SIMPLE BUNIONECTOMY AND SECOND HAMMER TOE CORRECTION;  Surgeon: Wylene Simmer, MD;  Location: [facility];  Service: Orthopedics;  Laterality: Right;   CARPAL TUNNEL RELEASE     FEMUR IM NAIL Right 08/10/2021   Procedure: INTRAMEDULLARY (IM) NAIL FEMORAL;  Surgeon: Rod Can, MD;  Location: [facility];  Service: Orthopedics;  Laterality: Right;   KNEE ARTHROSCOPY Right    LUMBAR LAMINECTOMY/DECOMPRESSION MICRODISCECTOMY N/A 11/13/2013   Procedure: LUMBAR TWO-THREE,LUMBAR THREE-FOUR,LUMBAR FOUR-FIVE LAMINECTOMY AND FORAMINOTOMY;  Surgeon: Ophelia Charter, MD;  Location: [facility];  Service: Neurosurgery;  Laterality: N/A;   MELANOMA EXCISION Left 09/19/2012   Procedure: WIDE EXCISION METASTATIC MELANOMA LEFT MEDIAL THIGH;  Surgeon: Zenovia Jarred, MD;  Location: [facility];  Service: General;  Laterality: Left;   ROTATOR CUFF REPAIR     SHOULDER SURGERY     TOE SURGERY     TOTAL HIP ARTHROPLASTY  09/04/2011   Procedure: TOTAL HIP ARTHROPLASTY;  Surgeon: Gearlean Alf, MD;  Location: [facility];  Service: Orthopedics;  Laterality: Left;   TOTAL HIP REVISION Left 11/27/2018   Procedure: Left hip femoral revision;  Surgeon: Gaynelle Arabian, MD;  Location: [facility];  Service: Orthopedics;  Laterality: Left;  113mn   TOTAL KNEE ARTHROPLASTY Right 10/06/2019   Procedure: TOTAL KNEE  ARTHROPLASTY;  Surgeon: Gaynelle Arabian, MD;  Location: [facility];  Service: Orthopedics;  Laterality: Right;  65mn     Current Outpatient Medications  Medication Sig Dispense Refill   acetaminophen (TYLENOL) 500 MG tablet Take 500 mg by mouth 3 (three) times daily.     alendronate (FOSAMAX) 70 MG tablet Take 70 mg by mouth every Sunday.     Biotin 5 MG CAPS Take 5 mg by mouth daily.     calcium carbonate (OS-CAL) 1250 (500 Ca) MG chewable tablet Chew 2 tablets by mouth daily.  (Patient not taking: Reported on 09/09/2021)     Cholecalciferol (VITAMIN D3) 50 MCG (2000 UT) TABS Take 2,000 Units by mouth daily.     collagenase (SANTYL) 250 UNIT/GM ointment Apply topically daily. Apply to sacral  Unstageable Pressure injury in a 1/8 inch layer once daily and PRN soiling of dressing indivating replacement 15 g 0   erythromycin ophthalmic ointment Place 1 application. into both eyes daily as needed (eye infections).     estradiol (ESTRACE) 0.1 MG/GM vaginal cream Place 1 Applicatorful vaginally every other day.     ferrous sulfate 325 (65 FE) MG tablet Take 1 tablet (325 mg total) by mouth daily with breakfast. (Patient not taking: Reported on 09/09/2021)  3   Flurandrenolide (CORDRAN) 4 MCG/SQCM TAPE Apply 1 each topically daily as needed (eczema dermatitis).     fluticasone (FLONASE) 50 MCG/ACT nasal spray Place 2 sprays into both nostrils every morning.      folic acid (FOLVITE) 1 MG tablet Take 1 tablet (1 mg total) by mouth daily. 30 tablet 0   gabapentin (NEURONTIN) 100 MG capsule Take 100 mg by mouth 3 (three) times daily.     hydrOXYzine (ATARAX) 25 MG tablet Take 25 mg by mouth every 6 (six) hours as needed for itching.     levothyroxine (SYNTHROID, LEVOTHROID) 75 MCG tablet Take 75 mcg by mouth daily with breakfast.     lidocaine (LIDODERM) 5 % Place 1 patch onto the skin daily. Remove & Discard patch within 12 hours or as directed by MD 5 patch 0   loratadine (CLARITIN) 10 MG tablet Take 10 mg by mouth daily.     metoprolol succinate (TOPROL-XL) 25 MG 24 hr tablet Take 0.5 tablets (12.5 mg total) by mouth daily. 30 tablet 0   nutrition supplement, JUVEN, (JUVEN) PACK Take 1 packet by mouth 2 (two) times daily between meals.  0   polyethylene glycol powder (GLYCOLAX/MIRALAX) 17 GM/SCOOP powder Take 8.5 g by mouth at bedtime.     potassium chloride SA (KLOR-CON) 10 MEQ tablet Take 1 tablet (10 mEq total) by mouth daily. (Patient taking differently: Take 20 mEq by mouth in the morning and at bedtime.) 30 tablet 2   Probiotic Product (PROBIOTIC PO) Take 1 capsule by mouth daily.     rosuvastatin (CRESTOR) 10 MG tablet Take 10 mg by mouth at bedtime.     tretinoin (RETIN-A) 0.1 % cream  Apply topically at bedtime. (Patient taking differently: Apply 1 application. topically at bedtime.) 45 g 3   vitamin B-12 (CYANOCOBALAMIN) 500 MCG tablet Take 500 mcg by mouth daily.     No current facility-administered medications for this visit.    Allergies:   Buprenorphine hcl, Codeine, Morphine, Morphine and related, Tramadol, Adhesive [tape], Doxycycline hyclate, Hydrocodone-acetaminophen, Other, Oxycodone hcl, Tapentadol, Tapentadol hcl, Tramadol hcl, Hydrocodone-acetaminophen, Lactose intolerance (gi), and Oxycodone    ROS:  Please see the history of present illness.   Otherwise, review  of systems are positive for *** .   All other systems are reviewed and negative.    PHYSICAL EXAM: VS:  There were no vitals taken for this visit. , BMI There is no height or weight on file to calculate BMI. GENERAL:  Well appearing NECK:  No jugular venous distention, waveform within normal limits, carotid upstroke brisk and symmetric, no bruits, no thyromegaly LUNGS:  Clear to auscultation bilaterally CHEST:  Unremarkable HEART:  PMI not displaced or sustained,S1 and S2 within normal limits, no S3, no S4, no clicks, no rubs, *** murmurs ABD:  Flat, positive bowel sounds normal in frequency in pitch, no bruits, no rebound, no guarding, no midline pulsatile mass, no hepatomegaly, no splenomegaly EXT:  2 plus pulses throughout, no edema, no cyanosis no clubbing     ***GENERAL:  Well appearing NECK:  No jugular venous distention, waveform within normal limits, carotid upstroke brisk and symmetric, no bruits, no thyromegaly LUNGS:  Clear to auscultation bilaterally CHEST:  Unremarkable HEART:  PMI not displaced or sustained,S1 and S2 within normal limits, no S3, no S4, no clicks, no rubs, no murmurs ABD:  Flat, positive bowel sounds normal in frequency in pitch, no bruits, no rebound, no guarding, no midline pulsatile mass, no hepatomegaly, no splenomegaly EXT:  2 plus pulses throughout, no edema,  no cyanosis no clubbing   EKG:  EKG is *** ordered today. Sinus rhythm, rate ***, axis within normal limits, intervals within normal limits, no acute changes.  Recent Labs: 08/10/2021: TSH 1.473 09/11/2021: ALT 7; Hemoglobin 10.0; Platelets 203 09/12/2021: BUN 9; Creatinine, Ser 0.42; Magnesium 1.8; Potassium 3.7; Sodium 139    Lipid Panel    Component Value Date/Time   CHOL 98 08/15/2021 1111   TRIG 136 08/15/2021 1111   HDL 29 (L) 08/15/2021 1111   CHOLHDL 3.4 08/15/2021 1111   VLDL 27 08/15/2021 1111   LDLCALC 42 08/15/2021 1111      Wt Readings from Last 3 Encounters:  09/12/21 126 lb 1.7 oz (57.2 kg)  08/10/21 129 lb 10.1 oz (58.8 kg)  05/06/21 124 lb (56.2 kg)      Other studies Reviewed: Additional studies/ records that were reviewed today include:   *** Review of the above records demonstrates:  Please see elsewhere in the note.     ASSESSMENT AND PLAN:   Hypertension:    *** The blood pressure is well controlled and reading up slightly from some low blood pressures that she had.  I am happy with slightly higher blood pressures to avoid any presyncope or falls.   Palpitations:  ***    Current medicines are reviewed at length with the patient today.  The patient does not have concerns regarding medicines.  The following changes have been made: None  Labs/ tests ordered today include:  .  None  No orders of the defined types were placed in this encounter.      Disposition:   FU with me in 6 months   Signed, Minus Breeding, MD  11/03/2021 9:09 PM    [facility]

## 2021-11-04 ENCOUNTER — Ambulatory Visit: Payer: Medicare Other | Admitting: Cardiology

## 2021-11-04 DIAGNOSIS — R002 Palpitations: Secondary | ICD-10-CM

## 2021-11-04 DIAGNOSIS — I10 Essential (primary) hypertension: Secondary | ICD-10-CM

## 2021-11-07 DIAGNOSIS — S72144D Nondisplaced intertrochanteric fracture of right femur, subsequent encounter for closed fracture with routine healing: Secondary | ICD-10-CM | POA: Diagnosis not present

## 2021-11-09 ENCOUNTER — Encounter: Payer: Self-pay | Admitting: Cardiology

## 2021-11-09 DIAGNOSIS — M89062 Algoneurodystrophy, left lower leg: Secondary | ICD-10-CM | POA: Diagnosis not present

## 2021-11-10 DIAGNOSIS — E039 Hypothyroidism, unspecified: Secondary | ICD-10-CM | POA: Diagnosis not present

## 2021-11-10 DIAGNOSIS — M19042 Primary osteoarthritis, left hand: Secondary | ICD-10-CM | POA: Diagnosis not present

## 2021-11-10 DIAGNOSIS — M160 Bilateral primary osteoarthritis of hip: Secondary | ICD-10-CM | POA: Diagnosis not present

## 2021-11-10 DIAGNOSIS — Z960 Presence of urogenital implants: Secondary | ICD-10-CM | POA: Diagnosis not present

## 2021-11-10 DIAGNOSIS — L89151 Pressure ulcer of sacral region, stage 1: Secondary | ICD-10-CM | POA: Diagnosis not present

## 2021-11-10 DIAGNOSIS — M810 Age-related osteoporosis without current pathological fracture: Secondary | ICD-10-CM | POA: Diagnosis not present

## 2021-11-10 DIAGNOSIS — I2510 Atherosclerotic heart disease of native coronary artery without angina pectoris: Secondary | ICD-10-CM | POA: Diagnosis not present

## 2021-11-10 DIAGNOSIS — M47816 Spondylosis without myelopathy or radiculopathy, lumbar region: Secondary | ICD-10-CM | POA: Diagnosis not present

## 2021-11-10 DIAGNOSIS — Z7982 Long term (current) use of aspirin: Secondary | ICD-10-CM | POA: Diagnosis not present

## 2021-11-10 DIAGNOSIS — M48061 Spinal stenosis, lumbar region without neurogenic claudication: Secondary | ICD-10-CM | POA: Diagnosis not present

## 2021-11-10 DIAGNOSIS — M170 Bilateral primary osteoarthritis of knee: Secondary | ICD-10-CM | POA: Diagnosis not present

## 2021-11-10 DIAGNOSIS — K219 Gastro-esophageal reflux disease without esophagitis: Secondary | ICD-10-CM | POA: Diagnosis not present

## 2021-11-10 DIAGNOSIS — E785 Hyperlipidemia, unspecified: Secondary | ICD-10-CM | POA: Diagnosis not present

## 2021-11-10 DIAGNOSIS — K5900 Constipation, unspecified: Secondary | ICD-10-CM | POA: Diagnosis not present

## 2021-11-10 DIAGNOSIS — I10 Essential (primary) hypertension: Secondary | ICD-10-CM | POA: Diagnosis not present

## 2021-11-10 DIAGNOSIS — M19041 Primary osteoarthritis, right hand: Secondary | ICD-10-CM | POA: Diagnosis not present

## 2021-11-15 ENCOUNTER — Telehealth: Payer: Self-pay | Admitting: Physician Assistant

## 2021-11-20 NOTE — Progress Notes (Signed)
Cardiology Clinic Note   Patient Name: Kerri [name] Date of Encounter: 11/21/2021  Primary Care Provider:  Lavone Orn, MD Primary Cardiologist:  Minus Breeding, MD  Patient Profile     Kerri [name] 88 year old female presents to the clinic today for an evaluation of her essential hypertension and palpitations.  Past Medical History    Past Medical History:  Diagnosis Date   Adenomatous colon polyp    Asthma    Basal cell carcinoma 03/13/2017   left forehead - CX3 + cautery + 5FU   CAD (coronary artery disease)    Chest CTA 10/07: 40% or less pLAD;  ETT 8/09: negative   Carotid bruit    Chronic back pain    Depression    Diverticulosis    DJD (degenerative joint disease)    Dyslipidemia    Dysrhythmia    pvcs   Fatty liver    GERD (gastroesophageal reflux disease)    HTN (hypertension)    Hypothyroidism    IBS (irritable bowel syndrome)    Internal hemorrhoid    Melanoma ([place])    metastatic; left leg; s/p multiple excisions   Migraine    Osteoporosis    Spinal stenosis    Squamous cell carcinoma of skin 09/02/2015   mid chest - CX3 + 5FU   Squamous cell carcinoma of skin 11/07/2017   left wrist - tx p bx   Squamous cell carcinoma of skin 12/11/2017   left hand - tx p bx   Past Surgical History:  Procedure Laterality Date   BACK SURGERY     BLEPHAROPLASTY     BUNIONECTOMY WITH HAMMERTOE RECONSTRUCTION Right 01/16/2013   Procedure: RIGHT Barbie Banner OSTEOTOMY WITH SIMPLE BUNIONECTOMY AND SECOND HAMMER TOE CORRECTION;  Surgeon: Wylene Simmer, MD;  Location: [facility];  Service: Orthopedics;  Laterality: Right;   CARPAL TUNNEL RELEASE     FEMUR IM NAIL Right 08/10/2021   Procedure: INTRAMEDULLARY (IM) NAIL FEMORAL;  Surgeon: Rod Can, MD;  Location: [facility];  Service: Orthopedics;  Laterality: Right;   KNEE ARTHROSCOPY Right    LUMBAR LAMINECTOMY/DECOMPRESSION MICRODISCECTOMY N/A 11/13/2013   Procedure: LUMBAR TWO-THREE,LUMBAR THREE-FOUR,LUMBAR  FOUR-FIVE LAMINECTOMY AND FORAMINOTOMY;  Surgeon: Ophelia Charter, MD;  Location: [facility];  Service: Neurosurgery;  Laterality: N/A;   MELANOMA EXCISION Left 09/19/2012   Procedure: WIDE EXCISION METASTATIC MELANOMA LEFT MEDIAL THIGH;  Surgeon: Zenovia Jarred, MD;  Location: [facility];  Service: General;  Laterality: Left;   ROTATOR CUFF REPAIR     SHOULDER SURGERY     TOE SURGERY     TOTAL HIP ARTHROPLASTY  09/04/2011   Procedure: TOTAL HIP ARTHROPLASTY;  Surgeon: Gearlean Alf, MD;  Location: [facility];  Service: Orthopedics;  Laterality: Left;   TOTAL HIP REVISION Left 11/27/2018   Procedure: Left hip femoral revision;  Surgeon: Gaynelle Arabian, MD;  Location: [facility];  Service: Orthopedics;  Laterality: Left;  136mn   TOTAL KNEE ARTHROPLASTY Right 10/06/2019   Procedure: TOTAL KNEE ARTHROPLASTY;  Surgeon: AGaynelle Arabian MD;  Location: [facility];  Service: Orthopedics;  Laterality: Right;  563m    Allergies  Allergies  Allergen Reactions   Buprenorphine Hcl Itching and Other (See Comments)    Happened awhile back- patient noted that any narcotic-based medication makes her have a "bad reaction" Other reaction(s): Other Happened a while back. Patient noted that any narcotic based medication makes her have a bad reaction. Happened a while back. Patient noted that any narcotic  based medication makes her have a bad reaction. Happened awhile back- patient noted that any narcotic-based medication makes her have a "bad reaction"   Codeine Nausea And Vomiting and Other (See Comments)    Patient gets sick to her stomach Other reaction(s): nausea   Morphine Itching and Other (See Comments)    Happened awhile back- patient noted that any narcotic-based medication makes her have a "bad reaction" Other reaction(s): itching, Other Happened a while back. Patient noted that any narcotic based medication makes her have a bad reaction. Happened awhile back- patient noted that any  narcotic-based medication makes her have a "bad reaction"    Morphine And Related Itching and Other (See Comments)    Happened awhile back- patient noted that any narcotic-based medication makes her have a "bad reaction"   Tramadol Nausea And Vomiting and Other (See Comments)    Confusion and severe headaches, also   Adhesive [Tape] Other (See Comments)    Tears the skin    Doxycycline Hyclate Other (See Comments)    Makes the patient "ill" Other reaction(s): ill, Other Makes the patient "ill"    Hydrocodone-Acetaminophen Nausea And Vomiting    Other reaction(s): Other   Other Other (See Comments)    Other reaction(s): headache, dizzy and nausea Other reaction(s): Other   Oxycodone Hcl     Other reaction(s): Unknown   Tapentadol Other (See Comments)    Dizziness with doses higher than 50 mg twice a day Other reaction(s): Other Dizziness with doses higher than 50 mg twice a day   Tapentadol Hcl     Other reaction(s): Other   Tramadol Hcl Other (See Comments)    Other reaction(s): nausea, vomiting   Hydrocodone-Acetaminophen Nausea And Vomiting   Lactose Intolerance (Gi) Nausea Only and Other (See Comments)    GI upset    Oxycodone Nausea And Vomiting    Other reaction(s): nausea, vomiting    History of Present Illness     Kerri [name] has a past medical history of palpitations, coronary artery disease, cluster headache, hypothyroidism, HTN, and HLD.  She had coronary CTA in October 2007 which showed less than 40% proximal LAD stenosis.  Her stress testing 8/09 was negative.  She has not had an MI.  She has been following with Dr. Percival Spanish for blood pressure control.  Her echocardiogram 9/20 showed an LVEF of 55-60%, mild MR, trivial TR, mildly dilation of the ascending aorta measuring 39 mm.  She was seen by Dr. Percival Spanish 12/22.  At that time she was doing well.  She denied chest pain and dyspnea.  She continues to live by herself and do housework without issue.  She was admitted  to the hospital on 08/09/2021 and discharged on 08/21/2021.  She had tripped at home and fallen.  She reported that she hurt her hip break as she fell down.  She was unable to get up and remained on the floor for 3 days before her neighbor heard her scream.  She was transferred to [facility].  Her viral panel was negative.  Her urinalysis showed nitrite and bacteria.  Her chest x-ray was unremarkable.  Her CT head showed no acute fracture.  CT of her chest and abdomen and pelvis showed displaced in her thoracic right femoral fracture.  Her EKG showed sinus rhythm with diffuse ST depression involving the inferior lateral leads.  Cardiology was consulted for preoperative clearance prior to her hip surgery.  She was felt to be moderate risk for surgery due  to her advanced age and frailty.  Her echocardiogram showed hyperdynamic function with no RWMA.  She was admitted to the hospital on 09/09/2021 and discharged on 09/12/2021.  She was diagnosed with UTI and presented with fever, confusion, and malodorous urine.  She received IV fluids and IV antibiotics.  She presents to the clinic today for follow-up evaluation and states she is living with her daughter nurse.  Her daughter fired the first physical therapist.  She has another consult and will begin physical therapy again next week.  She talks about her accident and being on her bathroom floor for several days.  She is frustrated that her neighbors did not hear her.  She appears to be recovering well.  She ambulates today with a rolling walker.  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  She wishes that she could still drive.  Her blood pressure today is 100/60.  From a cardiac standpoint she remains stable.  I have asked her to increase her physical activity as tolerated and we will plan follow-up in 4 months.  Today she denies chest pain, shortness of breath, lower extremity edema, fatigue, palpitations, melena, hematuria,  hemoptysis, diaphoresis, weakness, presyncope, syncope, orthopnea, and PND.    Home Medications    Prior to Admission medications   Medication Sig Start Date End Date Taking? Authorizing Provider  acetaminophen (TYLENOL) 500 MG tablet Take 500 mg by mouth 3 (three) times daily.    [provider]  alendronate (FOSAMAX) 70 MG tablet Take 70 mg by mouth every Sunday. 11/15/18   [provider]  Biotin 5 MG CAPS Take 5 mg by mouth daily.    [provider]  calcium carbonate (OS-CAL) 1250 (500 Ca) MG chewable tablet Chew 2 tablets by mouth daily.  Patient not taking: Reported on 09/09/2021    [provider]  Cholecalciferol (VITAMIN D3) 50 MCG (2000 UT) TABS Take 2,000 Units by mouth daily.    [provider]  collagenase (SANTYL) 250 UNIT/GM ointment Apply topically daily. Apply to sacral Unstageable Pressure injury in a 1/8 inch layer once daily and PRN soiling of dressing indivating replacement 09/12/21   Aline August, MD  erythromycin ophthalmic ointment Place 1 application. into both eyes daily as needed (eye infections).    [provider]  estradiol (ESTRACE) 0.1 MG/GM vaginal cream Place 1 Applicatorful vaginally every other day. 06/06/19   [provider]  ferrous sulfate 325 (65 FE) MG tablet Take 1 tablet (325 mg total) by mouth daily with breakfast. Patient not taking: Reported on 09/09/2021 08/15/21   Antonieta Pert, MD  Flurandrenolide [facility]) 4 MCG/SQCM TAPE Apply 1 each topically daily as needed (eczema dermatitis). 09/12/21   Aline August, MD  fluticasone (FLONASE) 50 MCG/ACT nasal spray Place 2 sprays into both nostrils every morning.  03/29/19   [provider]  folic acid (FOLVITE) 1 MG tablet Take 1 tablet (1 mg total) by mouth daily. 09/12/21   Aline August, MD  gabapentin (NEURONTIN) 100 MG capsule Take 100 mg by mouth 3 (three) times daily.    [provider]  hydrOXYzine (ATARAX) 25 MG tablet  Take 25 mg by mouth every 6 (six) hours as needed for itching. 09/07/21   [provider]  levothyroxine (SYNTHROID, LEVOTHROID) 75 MCG tablet Take 75 mcg by mouth daily with breakfast.    [provider]  lidocaine (LIDODERM) 5 % Place 1 patch onto the skin daily. Remove & Discard patch within 12  hours or as directed by MD 08/21/21   Florencia Reasons, MD  loratadine (CLARITIN) 10 MG tablet Take 10 mg by mouth daily.    [provider]  metoprolol succinate (TOPROL-XL) 25 MG 24 hr tablet Take 0.5 tablets (12.5 mg total) by mouth daily. 09/12/21   Aline August, MD  nutrition supplement, JUVEN, (JUVEN) PACK Take 1 packet by mouth 2 (two) times daily between meals. 08/15/21   Antonieta Pert, MD  polyethylene glycol powder (GLYCOLAX/MIRALAX) 17 GM/SCOOP powder Take 8.5 g by mouth at bedtime.    [provider]  potassium chloride SA (KLOR-CON) 10 MEQ tablet Take 1 tablet (10 mEq total) by mouth daily. Patient taking differently: Take 20 mEq by mouth in the morning and at bedtime. 08/25/20   Minus Breeding, MD  Probiotic Product (PROBIOTIC PO) Take 1 capsule by mouth daily.    [provider]  rosuvastatin (CRESTOR) 10 MG tablet Take 10 mg by mouth at bedtime.    [provider]  tretinoin (RETIN-A) 0.1 % cream Apply topically at bedtime. Patient taking differently: Apply 1 application. topically at bedtime. 06/14/21 06/14/22  Robyne Askew R, PA-C  vitamin B-12 (CYANOCOBALAMIN) 500 MCG tablet Take 500 mcg by mouth daily.    [provider]    Family History    Family History  Problem Relation Age of Onset   CAD Mother    CAD Father    Kidney disease Child    She indicated that her mother is deceased. She indicated that her father is deceased. She indicated that her maternal grandmother is deceased. She indicated that her maternal grandfather is deceased. She indicated that her paternal grandmother is deceased. She indicated that her paternal  grandfather is deceased. She indicated that the status of her child is unknown.  Social History    Social History   Socioeconomic History   Marital status: Married    Spouse name: Not on file   Number of children: 4   Years of education: Not on file   Highest education level: Not on file  Occupational History   Occupation: retired  Tobacco Use   Smoking status: Former    Types: Cigarettes    Quit date: 08/31/1948    Years since quitting: 73.2   Smokeless tobacco: Never  Vaping Use   Vaping Use: Never used  Substance and Sexual Activity   Alcohol use: Yes    Alcohol/week: 2.0 standard drinks of alcohol    Types: 2 Glasses of wine per week   Drug use: No   Sexual activity: Never  Other Topics Concern   Not on file  Social History Narrative   Not on file   Social Determinants of Health   Financial Resource Strain: Not on file  Food Insecurity: Not on file  Transportation Needs: Not on file  Physical Activity: Not on file  Stress: Not on file  Social Connections: Not on file  Intimate Partner Violence: Not on file     Review of Systems    General:  No chills, fever, night sweats or weight changes.  Cardiovascular:  No chest pain, dyspnea on exertion, edema, orthopnea, palpitations, paroxysmal nocturnal dyspnea. Dermatological: No rash, lesions/masses Respiratory: No cough, dyspnea Urologic: No hematuria, dysuria Abdominal:   No nausea, vomiting, diarrhea, bright red blood per rectum, melena, or hematemesis Neurologic:  No visual changes, wkns, changes in mental status. All other systems reviewed and are otherwise negative except as noted above.  Physical Exam    VS:  BP 100/60   Pulse 75   Ht '5\' 5"'$  (1.651 m)   Wt 117 lb 3.2 oz (53.2 kg)   SpO2 96%   BMI 19.50 kg/m  , BMI Body mass index is 19.5 kg/m. GEN: Well nourished, well developed, in no acute distress. HEENT: normal. Neck: Supple, no JVD, carotid bruits, or masses. Cardiac: RRR, 2/6 systolic murmur  heard along apex, rubs, or gallops. No clubbing, cyanosis, edema.  Radials/DP/PT 2+ and equal bilaterally.  Respiratory:  Respirations regular and unlabored, clear to auscultation bilaterally. GI: Soft, nontender, nondistended, BS + x 4. MS: no deformity or atrophy. Skin: warm and dry, no rash. Neuro:  Strength and sensation are intact. Psych: Normal affect.  Accessory Clinical Findings    Recent Labs: 08/10/2021: TSH 1.473 09/11/2021: ALT 7; Hemoglobin 10.0; Platelets 203 09/12/2021: BUN 9; Creatinine, Ser 0.42; Magnesium 1.8; Potassium 3.7; Sodium 139   Recent Lipid Panel    Component Value Date/Time   CHOL 98 08/15/2021 1111   TRIG 136 08/15/2021 1111   HDL 29 (L) 08/15/2021 1111   CHOLHDL 3.4 08/15/2021 1111   VLDL 27 08/15/2021 1111   LDLCALC 42 08/15/2021 1111    ECG personally reviewed by me today-none today.  Echocardiogram 08/10/2021 IMPRESSIONS     1. Left ventricular ejection fraction, by estimation, is >75%. The left  ventricle has hyperdynamic function. The left ventricle has no regional  wall motion abnormalities. There is mild concentric left ventricular  hypertrophy. Left ventricular diastolic  parameters are consistent with Grade I diastolic dysfunction (impaired  relaxation).   2. Right ventricular systolic function is normal. The right ventricular  size is normal.   3. The mitral valve is grossly normal. No evidence of mitral valve  regurgitation. No evidence of mitral stenosis.   4. The aortic valve is calcified. There is moderate calcification of the  aortic valve. There is moderate thickening of the aortic valve. Aortic  valve regurgitation is not visualized. Mild aortic valve stenosis. Aortic  valve area, by VTI measures 1.28  cm. Aortic valve mean gradient measures 15.0 mmHg. Aortic valve Vmax  measures 2.64 m/s.   Comparison(s): Changes from prior study are noted. LV is now hyperdynamic.   Assessment & Plan   1.  Essential hypertension-BP  today 100/60.  Well-controlled at home. Continue metoprolol, Heart healthy low-sodium diet-salty 6 given Increase physical activity as tolerated  Coronary artery disease-no recent episodes of chest discomfort.  Underwent coronary CT in 2007 which showed 40% proximal LAD stenosis.  Echocardiogram 3/23 showed hyperdynamic function, G1 DD, trivial TR, and mild aortic stenosis. Continue rosuvastatin, metoprolol Heart healthy low-sodium high-fiber diet  Hyperlipidemia-LDL 42 on 08/15/2021 Continue rosuvastatin Heart healthy low-sodium high-fiber diet.   Increase physical activity as tolerated  NSVT-denies episodes of irregular heartbeat, accelerated heartbeat and palpitations.  Noted to have 6 beat run of NSVT during previous admission. Continue metoprolol  Right femoral fracture-has healed well.  Completed physical therapy and continues to do home health physical therapy.   Jossie Ng. Jimmi Sidener NP-C    11/21/2021, 9:58 AM [place] [address] Office [phone] Fax [phone]  Notice: This dictation was prepared with Dragon dictation along with smaller phrase technology. Any transcriptional errors that result from this process are unintentional and may not be corrected upon review.  I spent 14 minutes examining this patient, reviewing medications, and using patient centered shared decision making involving her cardiac care.  Prior to her visit I spent greater than 20  minutes reviewing her past medical history,  medications, and prior cardiac tests.

## 2021-11-21 ENCOUNTER — Encounter: Payer: Self-pay | Admitting: General Practice

## 2021-11-21 ENCOUNTER — Ambulatory Visit: Payer: Medicare Other | Admitting: General Practice

## 2021-11-21 VITALS — BP 100/60 | HR 75 | Ht 65.0 in | Wt 117.2 lb

## 2021-11-21 DIAGNOSIS — I4729 Other ventricular tachycardia: Secondary | ICD-10-CM

## 2021-11-21 DIAGNOSIS — E782 Mixed hyperlipidemia: Secondary | ICD-10-CM

## 2021-11-21 DIAGNOSIS — I10 Essential (primary) hypertension: Secondary | ICD-10-CM | POA: Diagnosis not present

## 2021-11-21 DIAGNOSIS — I2510 Atherosclerotic heart disease of native coronary artery without angina pectoris: Secondary | ICD-10-CM | POA: Diagnosis not present

## 2021-11-21 NOTE — Progress Notes (Signed)
Cardiology Clinic Note   Patient Name: Kerri [name] Date of Encounter: 11/21/2021  Primary Care Provider:  Lavone Orn, MD Primary Cardiologist:  Minus Breeding, MD  Patient Profile     Kerri [name] 88 year old female presents to the clinic today for an evaluation of her essential hypertension and palpitations.  Past Medical History    Past Medical History:  Diagnosis Date   Adenomatous colon polyp    Asthma    Basal cell carcinoma 03/13/2017   left forehead - CX3 + cautery + 5FU   CAD (coronary artery disease)    Chest CTA 10/07: 40% or less pLAD;  ETT 8/09: negative   Carotid bruit    Chronic back pain    Depression    Diverticulosis    DJD (degenerative joint disease)    Dyslipidemia    Dysrhythmia    pvcs   Fatty liver    GERD (gastroesophageal reflux disease)    HTN (hypertension)    Hypothyroidism    IBS (irritable bowel syndrome)    Internal hemorrhoid    Melanoma ([place])    metastatic; left leg; s/p multiple excisions   Migraine    Osteoporosis    Spinal stenosis    Squamous cell carcinoma of skin 09/02/2015   mid chest - CX3 + 5FU   Squamous cell carcinoma of skin 11/07/2017   left wrist - tx p bx   Squamous cell carcinoma of skin 12/11/2017   left hand - tx p bx   Past Surgical History:  Procedure Laterality Date   BACK SURGERY     BLEPHAROPLASTY     BUNIONECTOMY WITH HAMMERTOE RECONSTRUCTION Right 01/16/2013   Procedure: RIGHT Barbie Banner OSTEOTOMY WITH SIMPLE BUNIONECTOMY AND SECOND HAMMER TOE CORRECTION;  Surgeon: Wylene Simmer, MD;  Location: [place];  Service: Orthopedics;  Laterality: Right;   CARPAL TUNNEL RELEASE     FEMUR IM NAIL Right 08/10/2021   Procedure: INTRAMEDULLARY (IM) NAIL FEMORAL;  Surgeon: Rod Can, MD;  Location: [facility];  Service: Orthopedics;  Laterality: Right;   KNEE ARTHROSCOPY Right    LUMBAR LAMINECTOMY/DECOMPRESSION MICRODISCECTOMY N/A 11/13/2013   Procedure: LUMBAR TWO-THREE,LUMBAR THREE-FOUR,LUMBAR  FOUR-FIVE LAMINECTOMY AND FORAMINOTOMY;  Surgeon: Ophelia Charter, MD;  Location: [place] [facility];  Service: Neurosurgery;  Laterality: N/A;   MELANOMA EXCISION Left 09/19/2012   Procedure: WIDE EXCISION METASTATIC MELANOMA LEFT MEDIAL THIGH;  Surgeon: Zenovia Jarred, MD;  Location: [place];  Service: General;  Laterality: Left;   ROTATOR CUFF REPAIR     SHOULDER SURGERY     TOE SURGERY     TOTAL HIP ARTHROPLASTY  09/04/2011   Procedure: TOTAL HIP ARTHROPLASTY;  Surgeon: Gearlean Alf, MD;  Location: [facility];  Service: Orthopedics;  Laterality: Left;   TOTAL HIP REVISION Left 11/27/2018   Procedure: Left hip femoral revision;  Surgeon: Gaynelle Arabian, MD;  Location: [facility];  Service: Orthopedics;  Laterality: Left;  121mn   TOTAL KNEE ARTHROPLASTY Right 10/06/2019   Procedure: TOTAL KNEE ARTHROPLASTY;  Surgeon: AGaynelle Arabian MD;  Location: [facility];  Service: Orthopedics;  Laterality: Right;  594m    Allergies  Allergies  Allergen Reactions   Buprenorphine Hcl Itching and Other (See Comments)    Happened awhile back- patient noted that any narcotic-based medication makes her have a "bad reaction" Other reaction(s): Other Happened a while back. Patient noted that any narcotic based medication makes her have a bad reaction. Happened a while back. Patient noted that any  narcotic based medication makes her have a bad reaction. Happened awhile back- patient noted that any narcotic-based medication makes her have a "bad reaction"   Codeine Nausea And Vomiting and Other (See Comments)    Patient gets sick to her stomach Other reaction(s): nausea   Morphine Itching and Other (See Comments)    Happened awhile back- patient noted that any narcotic-based medication makes her have a "bad reaction" Other reaction(s): itching, Other Happened a while back. Patient noted that any narcotic based medication makes her have a bad reaction. Happened awhile back- patient noted that any  narcotic-based medication makes her have a "bad reaction"    Morphine And Related Itching and Other (See Comments)    Happened awhile back- patient noted that any narcotic-based medication makes her have a "bad reaction"   Tramadol Nausea And Vomiting and Other (See Comments)    Confusion and severe headaches, also   Adhesive [Tape] Other (See Comments)    Tears the skin    Doxycycline Hyclate Other (See Comments)    Makes the patient "ill" Other reaction(s): ill, Other Makes the patient "ill"    Hydrocodone-Acetaminophen Nausea And Vomiting    Other reaction(s): Other   Other Other (See Comments)    Other reaction(s): headache, dizzy and nausea Other reaction(s): Other   Oxycodone Hcl     Other reaction(s): Unknown   Tapentadol Other (See Comments)    Dizziness with doses higher than 50 mg twice a day Other reaction(s): Other Dizziness with doses higher than 50 mg twice a day   Tapentadol Hcl     Other reaction(s): Other   Tramadol Hcl Other (See Comments)    Other reaction(s): nausea, vomiting   Hydrocodone-Acetaminophen Nausea And Vomiting   Lactose Intolerance (Gi) Nausea Only and Other (See Comments)    GI upset    Oxycodone Nausea And Vomiting    Other reaction(s): nausea, vomiting    History of Present Illness     Dystany Duffy has a past medical history of palpitations, coronary artery disease, cluster headache, hypothyroidism, HTN, and HLD.  She had coronary CTA in October 2007 which showed less than 40% proximal LAD stenosis.  Her stress testing 8/09 was negative.  She has not had an MI.  She has been following with Dr. Percival Spanish for blood pressure control.  Her echocardiogram 9/20 showed an LVEF of 55-60%, mild MR, trivial TR, mildly dilation of the ascending aorta measuring 39 mm.  She was seen by Dr. Percival Spanish 12/22.  At that time she was doing well.  She denied chest pain and dyspnea.  She continues to live by herself and do housework without issue.  She was admitted  to the hospital on 08/09/2021 and discharged on 08/21/2021.  She had tripped at home and fallen.  She reported that she hurt her hip break as she fell down.  She was unable to get up and remained on the floor for 3 days before her neighbor heard her scream.  She was transferred to [facility] long [facility].  Her viral panel was negative.  Her urinalysis showed nitrite and bacteria.  Her chest x-ray was unremarkable.  Her CT head showed no acute fracture.  CT of her chest and abdomen and pelvis showed displaced in her thoracic right femoral fracture.  Her EKG showed sinus rhythm with diffuse ST depression involving the inferior lateral leads.  Cardiology was consulted for preoperative clearance prior to her hip surgery.  She was felt to be moderate risk for surgery  due to her advanced age and frailty.  Her echocardiogram showed hyperdynamic function with no RWMA.  She was admitted to the hospital on 09/09/2021 and discharged on 09/12/2021.  She was diagnosed with UTI and presented with fever, confusion, and malodorous urine.  She received IV fluids and IV antibiotics.  She presents to the clinic today for follow-up evaluation and states she is living with her daughter nurse.  Her daughter fired the first physical therapist.  She has another consult and will begin physical therapy again next week.  She talks about her accident and being on her bathroom floor for several days.  She is frustrated that her neighbors did not hear her.  She appears to be recovering well.  She ambulates today with a rolling walker.  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  She wishes that she could still drive.  Her blood pressure today is 100/60.  From a cardiac standpoint she remains stable.  I have asked her to increase her physical activity as tolerated and we will plan follow-up in 4 months.  Today she denies chest pain, shortness of breath, lower extremity edema, fatigue, palpitations, melena, hematuria,  hemoptysis, diaphoresis, weakness, presyncope, syncope, orthopnea, and PND.     Home Medications    Prior to Admission medications   Medication Sig Start Date End Date Taking? Authorizing Provider  acetaminophen (TYLENOL) 500 MG tablet Take 500 mg by mouth 3 (three) times daily.    [provider]  alendronate (FOSAMAX) 70 MG tablet Take 70 mg by mouth every Sunday. 11/15/18   [provider]  Biotin 5 MG CAPS Take 5 mg by mouth daily.    [provider]  calcium carbonate (OS-CAL) 1250 (500 Ca) MG chewable tablet Chew 2 tablets by mouth daily.  Patient not taking: Reported on 09/09/2021    [provider]  Cholecalciferol (VITAMIN D3) 50 MCG (2000 UT) TABS Take 2,000 Units by mouth daily.    [provider]  collagenase (SANTYL) 250 UNIT/GM ointment Apply topically daily. Apply to sacral Unstageable Pressure injury in a 1/8 inch layer once daily and PRN soiling of dressing indivating replacement 09/12/21   Aline August, MD  erythromycin ophthalmic ointment Place 1 application. into both eyes daily as needed (eye infections).    [provider]  estradiol (ESTRACE) 0.1 MG/GM vaginal cream Place 1 Applicatorful vaginally every other day. 06/06/19   [provider]  ferrous sulfate 325 (65 FE) MG tablet Take 1 tablet (325 mg total) by mouth daily with breakfast. Patient not taking: Reported on 09/09/2021 08/15/21   Antonieta Pert, MD  Flurandrenolide [facility]) 4 MCG/SQCM TAPE Apply 1 each topically daily as needed (eczema dermatitis). 09/12/21   Aline August, MD  fluticasone (FLONASE) 50 MCG/ACT nasal spray Place 2 sprays into both nostrils every morning.  03/29/19   [provider]  folic acid (FOLVITE) 1 MG tablet Take 1 tablet (1 mg total) by mouth daily. 09/12/21   Aline August, MD  gabapentin (NEURONTIN) 100 MG capsule Take 100 mg by mouth 3 (three) times daily.    [provider]  hydrOXYzine (ATARAX) 25 MG tablet  Take 25 mg by mouth every 6 (six) hours as needed for itching. 09/07/21   [provider]  levothyroxine (SYNTHROID, LEVOTHROID) 75 MCG tablet Take 75 mcg by mouth daily with breakfast.    [provider]  lidocaine (LIDODERM) 5 % Place 1 patch onto the skin daily. Remove & Discard patch  within 12 hours or as directed by MD 08/21/21   Florencia Reasons, MD  loratadine (CLARITIN) 10 MG tablet Take 10 mg by mouth daily.    [provider]  metoprolol succinate (TOPROL-XL) 25 MG 24 hr tablet Take 0.5 tablets (12.5 mg total) by mouth daily. 09/12/21   Aline August, MD  nutrition supplement, JUVEN, (JUVEN) PACK Take 1 packet by mouth 2 (two) times daily between meals. 08/15/21   Antonieta Pert, MD  polyethylene glycol powder (GLYCOLAX/MIRALAX) 17 GM/SCOOP powder Take 8.5 g by mouth at bedtime.    [provider]  potassium chloride SA (KLOR-CON) 10 MEQ tablet Take 1 tablet (10 mEq total) by mouth daily. Patient taking differently: Take 20 mEq by mouth in the morning and at bedtime. 08/25/20   Minus Breeding, MD  Probiotic Product (PROBIOTIC PO) Take 1 capsule by mouth daily.    [provider]  rosuvastatin (CRESTOR) 10 MG tablet Take 10 mg by mouth at bedtime.    [provider]  tretinoin (RETIN-A) 0.1 % cream Apply topically at bedtime. Patient taking differently: Apply 1 application. topically at bedtime. 06/14/21 06/14/22  Robyne Askew R, PA-C  vitamin B-12 (CYANOCOBALAMIN) 500 MCG tablet Take 500 mcg by mouth daily.    [provider]    Family History    Family History  Problem Relation Age of Onset   CAD Mother    CAD Father    Kidney disease Child    She indicated that her mother is deceased. She indicated that her father is deceased. She indicated that her maternal grandmother is deceased. She indicated that her maternal grandfather is deceased. She indicated that her paternal grandmother is deceased. She indicated that her paternal  grandfather is deceased. She indicated that the status of her child is unknown.  Social History    Social History   Socioeconomic History   Marital status: Married    Spouse name: Not on file   Number of children: 4   Years of education: Not on file   Highest education level: Not on file  Occupational History   Occupation: retired  Tobacco Use   Smoking status: Former    Types: Cigarettes    Quit date: 08/31/1948    Years since quitting: 73.2   Smokeless tobacco: Never  Vaping Use   Vaping Use: Never used  Substance and Sexual Activity   Alcohol use: Yes    Alcohol/week: 2.0 standard drinks of alcohol    Types: 2 Glasses of wine per week   Drug use: No   Sexual activity: Never  Other Topics Concern   Not on file  Social History Narrative   Not on file   Social Determinants of Health   Financial Resource Strain: Not on file  Food Insecurity: Not on file  Transportation Needs: Not on file  Physical Activity: Not on file  Stress: Not on file  Social Connections: Not on file  Intimate Partner Violence: Not on file     Review of Systems    General:  No chills, fever, night sweats or weight changes.  Cardiovascular:  No chest pain, dyspnea on exertion, edema, orthopnea, palpitations, paroxysmal nocturnal dyspnea. Dermatological: No rash, lesions/masses Respiratory: No cough, dyspnea Urologic: No hematuria, dysuria Abdominal:   No nausea, vomiting, diarrhea, bright red blood per rectum, melena, or hematemesis Neurologic:  No visual changes, wkns, changes in mental status. All other systems reviewed and are otherwise negative except as noted above.  Physical Exam  VS:  BP 100/60   Pulse 75   Ht '5\' 5"'$  (1.651 m)   Wt 117 lb 3.2 oz (53.2 kg)   SpO2 96%   BMI 19.50 kg/m  , BMI Body mass index is 19.5 kg/m. GEN: Well nourished, well developed, in no acute distress. HEENT: normal. Neck: Supple, no JVD, carotid bruits, or masses. Cardiac: RRR, 2/6 systolic murmur  heard along apex, rubs, or gallops. No clubbing, cyanosis, edema.  Radials/DP/PT 2+ and equal bilaterally.  Respiratory:  Respirations regular and unlabored, clear to auscultation bilaterally. GI: Soft, nontender, nondistended, BS + x 4. MS: no deformity or atrophy. Skin: warm and dry, no rash. Neuro:  Strength and sensation are intact. Psych: Normal affect.  Accessory Clinical Findings    Recent Labs: 08/10/2021: TSH 1.473 09/11/2021: ALT 7; Hemoglobin 10.0; Platelets 203 09/12/2021: BUN 9; Creatinine, Ser 0.42; Magnesium 1.8; Potassium 3.7; Sodium 139   Recent Lipid Panel    Component Value Date/Time   CHOL 98 08/15/2021 1111   TRIG 136 08/15/2021 1111   HDL 29 (L) 08/15/2021 1111   CHOLHDL 3.4 08/15/2021 1111   VLDL 27 08/15/2021 1111   LDLCALC 42 08/15/2021 1111    ECG personally reviewed by me today- none today.   Echocardiogram 08/10/2021 IMPRESSIONS     1. Left ventricular ejection fraction, by estimation, is >75%. The left  ventricle has hyperdynamic function. The left ventricle has no regional  wall motion abnormalities. There is mild concentric left ventricular  hypertrophy. Left ventricular diastolic  parameters are consistent with Grade I diastolic dysfunction (impaired  relaxation).   2. Right ventricular systolic function is normal. The right ventricular  size is normal.   3. The mitral valve is grossly normal. No evidence of mitral valve  regurgitation. No evidence of mitral stenosis.   4. The aortic valve is calcified. There is moderate calcification of the  aortic valve. There is moderate thickening of the aortic valve. Aortic  valve regurgitation is not visualized. Mild aortic valve stenosis. Aortic  valve area, by VTI measures 1.28  cm. Aortic valve mean gradient measures 15.0 mmHg. Aortic valve Vmax  measures 2.64 m/s.   Comparison(s): Changes from prior study are noted. LV is now hyperdynamic.   Assessment & Plan   1.  Essential hypertension-BP  today 100/60.  Well-controlled at home. Continue metoprolol, Heart healthy low-sodium diet-salty 6 given Increase physical activity as tolerated  Coronary artery disease-no recent episodes of chest discomfort.  Underwent coronary CT in 2007 which showed 40% proximal LAD stenosis.  Echocardiogram 3/23 showed hyperdynamic function, G1 DD, trivial TR, and mild aortic stenosis. Continue rosuvastatin, metoprolol Heart healthy low-sodium high-fiber diet  Hyperlipidemia-LDL 42 08/15/21 Continue rosuvastatin Heart healthy low-sodium high-fiber diet.   Increase physical activity as tolerated  NSVT-denies episodes of irregular heartbeat, accelerated heartbeat and palpitations.  Noted to have 6 beat run of NSVT during previous admission. Continue metoprolol  Right femoral fracture-has healed well.  Completed physical therapy and continues to do home health physical therapy.   Disposition: Follow-up with Dr. Percival Spanish or me in 4 months.   Jossie Ng. Cleaver NP-C    11/21/2021, 9:41 AM [address] Office [phone] Fax [phone]  Notice: This dictation was prepared with Dragon dictation along with smaller phrase technology. Any transcriptional errors that result from this process are unintentional and may not be corrected upon review.  I spent 14 minutes examining this patient, reviewing medications, and using patient centered shared decision  making involving her cardiac care.  Prior to her visit I spent greater than 20 minutes reviewing her past medical history,  medications, and prior cardiac tests.

## 2021-11-21 NOTE — Patient Instructions (Signed)
Medication Instructions:  The current medical regimen is effective;  continue present plan and medications as directed. Please refer to the Current Medication list given to you today.   *If you need a refill on your cardiac medications before your next appointment, please call your pharmacy*  Lab Work:   Testing/Procedures:  NONE    NONE If you have labs (blood work) drawn today and your tests are completely normal, you will receive your results only by: [place] (if you have MyChart) OR  A paper copy in the mail If you have any lab test that is abnormal or we need to change your treatment, we will call you to review the results.  Special Instructions PLEASE CONTINUE WITH PHYSICAL THERAPY  INCREASE CALORIC INTAKE  Follow-Up: Your next appointment:  4 month(s) In Person with Minus Breeding, MD    At [facility], you and your health needs are our priority.  As part of our continuing mission to provide you with exceptional heart care, we have created designated Provider Care Teams.  These Care Teams include your primary Cardiologist (physician) and Advanced Practice Providers (APPs -  Physician Assistants and Nurse Practitioners) who all work together to provide you with the care you need, when you need it.  We recommend signing up for the patient portal called "MyChart".  Sign up information is provided on this After Visit Summary.  MyChart is used to connect with patients for Virtual Visits (Telemedicine).  Patients are able to view lab/test results, encounter notes, upcoming appointments, etc.  Non-urgent messages can be sent to your provider as well.   To learn more about what you can do with MyChart, go to [email or web address].    Important Information About Sugar

## 2021-11-24 MED ORDER — CLOBETASOL PROPIONATE 0.05 % EX FOAM: Freq: Two times a day (BID) | CUTANEOUS | 4 refills | Status: DC

## 2021-11-24 NOTE — Telephone Encounter (Signed)
Phone call to patient to inform her that [facility] did okay the Clobetasol prescription. Also wanted to inform patient that her insurance company may not cover the medication and she can use [facility] and pay cash for the medication.  I spoke to patient's daughter [name] who is her POA and gave her this information. Prescription sent to pharmacy.

## 2021-11-24 NOTE — Addendum Note (Signed)
Addended by: Lennie Odor on: 11/24/2021 10:20 AM   Modules accepted: Orders

## 2021-11-28 DIAGNOSIS — H16223 Keratoconjunctivitis sicca, not specified as Sjogren's, bilateral: Secondary | ICD-10-CM | POA: Diagnosis not present

## 2021-11-29 DIAGNOSIS — S72144D Nondisplaced intertrochanteric fracture of right femur, subsequent encounter for closed fracture with routine healing: Secondary | ICD-10-CM | POA: Diagnosis not present

## 2021-11-29 DIAGNOSIS — M89062 Algoneurodystrophy, left lower leg: Secondary | ICD-10-CM | POA: Diagnosis not present

## 2021-11-29 DIAGNOSIS — M5416 Radiculopathy, lumbar region: Secondary | ICD-10-CM | POA: Diagnosis not present

## 2021-12-10 DIAGNOSIS — I2510 Atherosclerotic heart disease of native coronary artery without angina pectoris: Secondary | ICD-10-CM | POA: Diagnosis not present

## 2021-12-10 DIAGNOSIS — Z7982 Long term (current) use of aspirin: Secondary | ICD-10-CM | POA: Diagnosis not present

## 2021-12-10 DIAGNOSIS — K219 Gastro-esophageal reflux disease without esophagitis: Secondary | ICD-10-CM | POA: Diagnosis not present

## 2021-12-10 DIAGNOSIS — M19042 Primary osteoarthritis, left hand: Secondary | ICD-10-CM | POA: Diagnosis not present

## 2021-12-10 DIAGNOSIS — I10 Essential (primary) hypertension: Secondary | ICD-10-CM | POA: Diagnosis not present

## 2021-12-10 DIAGNOSIS — E785 Hyperlipidemia, unspecified: Secondary | ICD-10-CM | POA: Diagnosis not present

## 2021-12-10 DIAGNOSIS — K5900 Constipation, unspecified: Secondary | ICD-10-CM | POA: Diagnosis not present

## 2021-12-10 DIAGNOSIS — E039 Hypothyroidism, unspecified: Secondary | ICD-10-CM | POA: Diagnosis not present

## 2021-12-10 DIAGNOSIS — M47816 Spondylosis without myelopathy or radiculopathy, lumbar region: Secondary | ICD-10-CM | POA: Diagnosis not present

## 2021-12-10 DIAGNOSIS — M48061 Spinal stenosis, lumbar region without neurogenic claudication: Secondary | ICD-10-CM | POA: Diagnosis not present

## 2021-12-10 DIAGNOSIS — M170 Bilateral primary osteoarthritis of knee: Secondary | ICD-10-CM | POA: Diagnosis not present

## 2021-12-10 DIAGNOSIS — L89151 Pressure ulcer of sacral region, stage 1: Secondary | ICD-10-CM | POA: Diagnosis not present

## 2021-12-10 DIAGNOSIS — M160 Bilateral primary osteoarthritis of hip: Secondary | ICD-10-CM | POA: Diagnosis not present

## 2021-12-10 DIAGNOSIS — Z960 Presence of urogenital implants: Secondary | ICD-10-CM | POA: Diagnosis not present

## 2021-12-10 DIAGNOSIS — M19041 Primary osteoarthritis, right hand: Secondary | ICD-10-CM | POA: Diagnosis not present

## 2021-12-10 DIAGNOSIS — M810 Age-related osteoporosis without current pathological fracture: Secondary | ICD-10-CM | POA: Diagnosis not present

## 2021-12-19 ENCOUNTER — Encounter (HOSPITAL_COMMUNITY): Payer: Self-pay | Admitting: Emergency Medicine

## 2021-12-19 ENCOUNTER — Observation Stay (HOSPITAL_COMMUNITY): Payer: Medicare Other

## 2021-12-19 ENCOUNTER — Other Ambulatory Visit: Payer: Self-pay

## 2021-12-19 ENCOUNTER — Emergency Department (HOSPITAL_COMMUNITY): Payer: Medicare Other

## 2021-12-19 ENCOUNTER — Observation Stay (HOSPITAL_COMMUNITY)
Admission: EM | Admit: 2021-12-19 | Discharge: 2021-12-20 | Disposition: A | Discharge status: Home Health | Payer: Medicare Other | Attending: Internal Medicine | Admitting: Internal Medicine

## 2021-12-19 DIAGNOSIS — R519 Headache, unspecified: Secondary | ICD-10-CM | POA: Diagnosis not present

## 2021-12-19 DIAGNOSIS — Z96642 Presence of left artificial hip joint: Secondary | ICD-10-CM | POA: Diagnosis not present

## 2021-12-19 DIAGNOSIS — L8990 Pressure ulcer of unspecified site, unspecified stage: Secondary | ICD-10-CM | POA: Diagnosis not present

## 2021-12-19 DIAGNOSIS — R0902 Hypoxemia: Secondary | ICD-10-CM | POA: Diagnosis not present

## 2021-12-19 DIAGNOSIS — Z85828 Personal history of other malignant neoplasm of skin: Secondary | ICD-10-CM | POA: Diagnosis not present

## 2021-12-19 DIAGNOSIS — E039 Hypothyroidism, unspecified: Secondary | ICD-10-CM | POA: Diagnosis present

## 2021-12-19 DIAGNOSIS — G894 Chronic pain syndrome: Secondary | ICD-10-CM | POA: Diagnosis present

## 2021-12-19 DIAGNOSIS — I10 Essential (primary) hypertension: Secondary | ICD-10-CM | POA: Diagnosis not present

## 2021-12-19 DIAGNOSIS — N390 Urinary tract infection, site not specified: Secondary | ICD-10-CM | POA: Diagnosis not present

## 2021-12-19 DIAGNOSIS — I2510 Atherosclerotic heart disease of native coronary artery without angina pectoris: Secondary | ICD-10-CM | POA: Insufficient documentation

## 2021-12-19 DIAGNOSIS — R29810 Facial weakness: Secondary | ICD-10-CM | POA: Diagnosis not present

## 2021-12-19 DIAGNOSIS — G4489 Other headache syndrome: Secondary | ICD-10-CM | POA: Diagnosis not present

## 2021-12-19 DIAGNOSIS — Z79899 Other long term (current) drug therapy: Secondary | ICD-10-CM | POA: Diagnosis not present

## 2021-12-19 DIAGNOSIS — Z87891 Personal history of nicotine dependence: Secondary | ICD-10-CM | POA: Diagnosis not present

## 2021-12-19 DIAGNOSIS — J45909 Unspecified asthma, uncomplicated: Secondary | ICD-10-CM | POA: Insufficient documentation

## 2021-12-19 DIAGNOSIS — R42 Dizziness and giddiness: Secondary | ICD-10-CM | POA: Diagnosis not present

## 2021-12-19 DIAGNOSIS — Z66 Do not resuscitate: Secondary | ICD-10-CM | POA: Diagnosis present

## 2021-12-19 DIAGNOSIS — Z8673 Personal history of transient ischemic attack (TIA), and cerebral infarction without residual deficits: Secondary | ICD-10-CM | POA: Diagnosis not present

## 2021-12-19 DIAGNOSIS — R55 Syncope and collapse: Secondary | ICD-10-CM | POA: Diagnosis not present

## 2021-12-19 DIAGNOSIS — E785 Hyperlipidemia, unspecified: Secondary | ICD-10-CM | POA: Diagnosis present

## 2021-12-19 DIAGNOSIS — Z96651 Presence of right artificial knee joint: Secondary | ICD-10-CM | POA: Diagnosis not present

## 2021-12-19 LAB — CBC WITH DIFFERENTIAL/PLATELET
Abs Immature Granulocytes: 0.02 10*3/uL (ref 0.00–0.07)
Basophils Absolute: 0 10*3/uL (ref 0.0–0.1)
Basophils Relative: 0 %
Eosinophils Absolute: 0.1 10*3/uL (ref 0.0–0.5)
Eosinophils Relative: 1 %
HCT: 35.9 % — ABNORMAL LOW (ref 36.0–46.0)
Hemoglobin: 11.4 g/dL — ABNORMAL LOW (ref 12.0–15.0)
Immature Granulocytes: 0 %
Lymphocytes Relative: 28 %
Lymphs Abs: 2.1 10*3/uL (ref 0.7–4.0)
MCH: 27.7 pg (ref 26.0–34.0)
MCHC: 31.8 g/dL (ref 30.0–36.0)
MCV: 87.3 fL (ref 80.0–100.0)
Monocytes Absolute: 0.5 10*3/uL (ref 0.1–1.0)
Monocytes Relative: 7 %
Neutro Abs: 4.8 10*3/uL (ref 1.7–7.7)
Neutrophils Relative %: 64 %
Platelets: 216 10*3/uL (ref 150–400)
RBC: 4.11 MIL/uL (ref 3.87–5.11)
RDW: 14.8 % (ref 11.5–15.5)
WBC: 7.5 10*3/uL (ref 4.0–10.5)
nRBC: 0 % (ref 0.0–0.2)

## 2021-12-19 LAB — BASIC METABOLIC PANEL
Anion gap: 8 (ref 5–15)
BUN: 19 mg/dL (ref 8–23)
CO2: 24 mmol/L (ref 22–32)
Calcium: 9.5 mg/dL (ref 8.9–10.3)
Chloride: 108 mmol/L (ref 98–111)
Creatinine, Ser: 0.84 mg/dL (ref 0.44–1.00)
GFR, Estimated: >60 mL/min (ref >60)
Glucose, Bld: 91 mg/dL (ref 70–99)
Potassium: 3.9 mmol/L (ref 3.5–5.1)
Sodium: 140 mmol/L (ref 135–145)

## 2021-12-19 LAB — I-STAT CHEM 8, ED
BUN: 21 mg/dL (ref 8–23)
Calcium, Ion: 1.18 mmol/L (ref 1.15–1.40)
Chloride: 104 mmol/L (ref 98–111)
Creatinine, Ser: 0.8 mg/dL (ref 0.44–1.00)
Glucose, Bld: 88 mg/dL (ref 70–99)
HCT: 35 % — ABNORMAL LOW (ref 36.0–46.0)
Hemoglobin: 11.9 g/dL — ABNORMAL LOW (ref 12.0–15.0)
Potassium: 3.9 mmol/L (ref 3.5–5.1)
Sodium: 141 mmol/L (ref 135–145)
TCO2: 25 mmol/L (ref 22–32)

## 2021-12-19 LAB — PROTIME-INR
INR: 1.1 (ref 0.8–1.2)
Prothrombin Time: 14.1 seconds (ref 11.4–15.2)

## 2021-12-19 MED ORDER — ONDANSETRON HCL 4 MG/2ML IJ SOLN: 4.0000 mg | Freq: Four times a day (QID) | INTRAMUSCULAR | Status: DC | PRN

## 2021-12-19 MED ORDER — FLUTICASONE PROPIONATE 50 MCG/ACT NA SUSP
2.0000 | NASAL | Status: DC
  Administered 2021-12-20: 2 via NASAL
  Filled 2021-12-19: qty 16

## 2021-12-19 MED ORDER — LORATADINE 10 MG PO TABS
10.0000 mg | ORAL_TABLET | Freq: Every day | ORAL | Status: DC
  Administered 2021-12-20: 10 mg via ORAL
  Filled 2021-12-19: qty 1

## 2021-12-19 MED ORDER — ENOXAPARIN SODIUM 40 MG/0.4ML IJ SOSY
40.0000 mg | PREFILLED_SYRINGE | INTRAMUSCULAR | Status: DC
  Filled 2021-12-19: qty 0.4

## 2021-12-19 MED ORDER — ACETAMINOPHEN 325 MG PO TABS
650.0000 mg | ORAL_TABLET | Freq: Four times a day (QID) | ORAL | Status: DC | PRN
  Administered 2021-12-19: 650 mg via ORAL
  Filled 2021-12-19: qty 2

## 2021-12-19 MED ORDER — TAPENTADOL HCL 50 MG PO TABS
50.0000 mg | ORAL_TABLET | Freq: Two times a day (BID) | ORAL | Status: DC
  Administered 2021-12-20: 50 mg via ORAL
  Filled 2021-12-19 (×2): qty 1

## 2021-12-19 MED ORDER — ONDANSETRON HCL 4 MG PO TABS: 4.0000 mg | ORAL_TABLET | Freq: Four times a day (QID) | ORAL | Status: DC | PRN

## 2021-12-19 MED ORDER — GABAPENTIN 100 MG PO CAPS
100.0000 mg | ORAL_CAPSULE | Freq: Three times a day (TID) | ORAL | Status: DC
  Administered 2021-12-19 – 2021-12-20 (×3): 100 mg via ORAL
  Filled 2021-12-19 (×3): qty 1

## 2021-12-19 MED ORDER — LEVOTHYROXINE SODIUM 75 MCG PO TABS
75.0000 ug | ORAL_TABLET | Freq: Every day | ORAL | Status: DC
  Administered 2021-12-20: 75 ug via ORAL
  Filled 2021-12-19: qty 1

## 2021-12-19 MED ORDER — TRIMETHOPRIM 100 MG PO TABS
100.0000 mg | ORAL_TABLET | Freq: Every day | ORAL | Status: DC
  Administered 2021-12-20: 100 mg via ORAL
  Filled 2021-12-19: qty 1

## 2021-12-19 MED ORDER — SODIUM CHLORIDE 0.9% FLUSH
3.0000 mL | Freq: Two times a day (BID) | INTRAVENOUS | Status: DC
  Administered 2021-12-19 – 2021-12-20 (×2): 3 mL via INTRAVENOUS

## 2021-12-19 MED ORDER — ROSUVASTATIN CALCIUM 5 MG PO TABS
10.0000 mg | ORAL_TABLET | Freq: Every day | ORAL | Status: DC
  Administered 2021-12-20: 10 mg via ORAL
  Filled 2021-12-19: qty 2

## 2021-12-19 MED ORDER — ACETAMINOPHEN 650 MG RE SUPP: 650.0000 mg | Freq: Four times a day (QID) | RECTAL | Status: DC | PRN

## 2021-12-19 NOTE — ED Provider Notes (Signed)
[facility] EMERGENCY DEPARTMENT Provider Note   CSN: [id number] Arrival date & time: 12/19/21  1153     History  Chief Complaint  Patient presents with   Stroke Symptoms    Gabriell Daigneault is a 88 y.o. female with history of cluster headaches/migraine, asthma, CAD, chronic back pain, depression, hyperlipidemia, GERD, hypothyroidism, irritable bowel syndrome, metastatic melanoma of left lower extremity with multiple excisions, multiple skin squamous cell carcinoma lesions, osteoporosis, spinal stenosis, recent hospitalization from 08/09/2021-08/21/2021 for hip fractures, multiple punctuated CVAs, ESBL UTI who presents the emergency department today after a syncopal episode associated with a right facial droop.  The patient had a syncopal episode while sitting in chair at approximately 10 AM this morning.  She had no preceding chest pain or shortness of breath.  The patient had syncopized in her chair without falling to the ground.  She was apparently quite difficult to arouse and family had tried to wake her for approximately 10 minutes when she had awoken and returned to her baseline immediately.  No epileptic activity noted by bystanders.  Patient after awakening had right facial droop that had spontaneously resolved prior to presentation.  Per family she is at her mental baseline.  She had no recent fevers or chills.  States that she has been eating well but has not kept up with her hydration recently.  HPI     Home Medications Prior to Admission medications   Medication Sig Start Date End Date Taking? Authorizing Provider  acetaminophen (TYLENOL) 500 MG tablet Take 500 mg by mouth 3 (three) times daily.    [provider]  alendronate (FOSAMAX) 70 MG tablet Take 70 mg by mouth every Sunday. 11/15/18   [provider]  Biotin 5 MG CAPS Take 5 mg by mouth daily.    [provider]  calcium carbonate (OS-CAL) 1250 (500 Ca) MG chewable tablet Chew 2  tablets by mouth daily.    [provider]  Cholecalciferol (VITAMIN D3) 50 MCG (2000 UT) TABS Take 2,000 Units by mouth daily.    [provider]  clobetasol (OLUX) 0.05 % topical foam Apply topically 2 (two) times daily. 11/24/21   Sheffield, Vida Roller R, PA-C  collagenase (SANTYL) 250 UNIT/GM ointment Apply topically daily. Apply to sacral Unstageable Pressure injury in a 1/8 inch layer once daily and PRN soiling of dressing indivating replacement 09/12/21   Aline August, MD  erythromycin ophthalmic ointment Place 1 application. into both eyes daily as needed (eye infections).    [provider]  estradiol (ESTRACE) 0.1 MG/GM vaginal cream Place 1 Applicatorful vaginally every other day. 06/06/19   [provider]  ferrous sulfate 325 (65 FE) MG tablet Take 1 tablet (325 mg total) by mouth daily with breakfast. 08/15/21   Antonieta Pert, MD  Flurandrenolide [facility]) 4 MCG/SQCM TAPE Apply 1 each topically daily as needed (eczema dermatitis). 09/12/21   Aline August, MD  fluticasone (FLONASE) 50 MCG/ACT nasal spray Place 2 sprays into both nostrils every morning.  03/29/19   [provider]  folic acid (FOLVITE) 1 MG tablet Take 1 tablet (1 mg total) by mouth daily. 09/12/21   Aline August, MD  gabapentin (NEURONTIN) 100 MG capsule Take 100 mg by mouth 3 (three) times daily.    [provider]  levothyroxine (SYNTHROID, LEVOTHROID) 75 MCG tablet Take 75 mcg by mouth daily with breakfast.    [provider]  loratadine (CLARITIN) 10 MG tablet Take 10 mg by mouth daily.  [provider]  metoprolol succinate (TOPROL-XL) 25 MG 24 hr tablet Take 0.5 tablets (12.5 mg total) by mouth daily. 09/12/21   Aline August, MD  NUCYNTA 50 MG tablet Take 50 mg by mouth 2 (two) times daily. 10/28/21   [provider]  Probiotic Product (PROBIOTIC PO) Take 1 capsule by mouth daily.    [provider]  rosuvastatin (CRESTOR) 10 MG tablet  Take 10 mg by mouth at bedtime.    [provider]  tretinoin (RETIN-A) 0.1 % cream Apply topically at bedtime. Patient taking differently: Apply 1 application  topically at bedtime. 06/14/21 06/14/22  Robyne Askew R, PA-C  vitamin B-12 (CYANOCOBALAMIN) 500 MCG tablet Take 500 mcg by mouth daily.    [provider]      Allergies    Buprenorphine hcl, Codeine, Morphine, Morphine and related, Tramadol, Adhesive [tape], Doxycycline hyclate, Hydrocodone-acetaminophen, Other, Oxycodone hcl, Tapentadol, Tapentadol hcl, Tramadol hcl, Hydrocodone-acetaminophen, Lactose intolerance (gi), and Oxycodone    Review of Systems   Review of Systems  Constitutional:  Negative for chills and fever.  HENT:  Negative for ear pain and sore throat.   Eyes:  Negative for pain and visual disturbance.  Respiratory:  Negative for cough and shortness of breath.   Cardiovascular:  Negative for chest pain and palpitations.  Gastrointestinal:  Negative for abdominal pain and vomiting.  Genitourinary:  Negative for dysuria and hematuria.  Musculoskeletal:  Negative for arthralgias and back pain.  Skin:  Negative for color change and rash.  Neurological:  Positive for syncope and facial asymmetry. Negative for seizures.  All other systems reviewed and are negative.   Physical Exam Updated Vital Signs BP 105/67   Pulse 70   Temp 98.2 F (36.8 C) (Oral)   Resp 16   SpO2 98%  Physical Exam Vitals and nursing note reviewed.  Constitutional:      General: She is not in acute distress.    Appearance: She is well-developed.     Comments: Monitor in the exam room where the patient is lying in bed awake and alert no acute distress.  She is thin and frail.  HENT:     Head: Normocephalic and atraumatic.  Eyes:     Conjunctiva/sclera: Conjunctivae normal.  Cardiovascular:     Rate and Rhythm: Normal rate and regular rhythm.     Heart sounds: No murmur heard.    Comments: 2-6 systolic murmur  best heard at the aortic area.  Distal radial pulses symmetric. Pulmonary:     Effort: Pulmonary effort is normal. No respiratory distress.     Breath sounds: Normal breath sounds.     Comments: CTAB.  Speaking in full sentences that difficulty. Abdominal:     Palpations: Abdomen is soft.     Tenderness: There is no abdominal tenderness.     Comments: Nondistended nontender.  Musculoskeletal:        General: No swelling.     Cervical back: Neck supple.  Skin:    General: Skin is warm and dry.     Capillary Refill: Capillary refill takes less than 2 seconds.  Neurological:     Mental Status: She is alert.     GCS: GCS eye subscore is 4. GCS verbal subscore is 5. GCS motor subscore is 6.     Cranial Nerves: Cranial nerves 2-12 are intact.     Sensory: Sensation is intact.     Motor: Motor function is intact.     Comments: Symmetric 5-5 strength  in the upper extremities and lower extremities.  Sensation intact in all 4 extremities.  Finger-nose appropriate bilaterally.   Psychiatric:        Mood and Affect: Mood normal.     ED Results / Procedures / Treatments   Labs (all labs ordered are listed, but only abnormal results are displayed) Labs Reviewed  CBC WITH DIFFERENTIAL/PLATELET - Abnormal; Notable for the following components:      Result Value   Hemoglobin 11.4 (*)    HCT 35.9 (*)    All other components within normal limits  I-STAT CHEM 8, ED - Abnormal; Notable for the following components:   Hemoglobin 11.9 (*)    HCT 35.0 (*)    All other components within normal limits  BASIC METABOLIC PANEL  PROTIME-INR  CBG MONITORING, ED    EKG None  Radiology CT Head Wo Contrast  Result Date: 12/19/2021 CLINICAL DATA:  Transient ischemic attack. Sudden onset of bad headache. Dizziness. EXAM: CT HEAD WITHOUT CONTRAST TECHNIQUE: Contiguous axial images were obtained from the base of the skull through the vertex without intravenous contrast. RADIATION DOSE REDUCTION: This  exam was performed according to the departmental dose-optimization program which includes automated exposure control, adjustment of the mA and/or kV according to patient size and/or use of iterative reconstruction technique. COMPARISON:  MRI examination dated September 09, 2021 FINDINGS: Brain: No evidence of acute infarction, hemorrhage, hydrocephalus, extra-axial collection or mass lesion/mass effect. Hypodensity of the left basal ganglia consistent with chronic infarct, unchanged. Moderate cerebral atrophy and chronic microvascular ischemic changes of the white matter. Vascular: No hyperdense vessel or unexpected calcification. Skull: Normal. Negative for fracture or focal lesion. Sinuses/Orbits: No acute finding. Other: None. IMPRESSION: 1. No acute intracranial abnormality. 2. Stable chronic findings including cerebral volume loss chronic left basal ganglia infarct and microvascular ischemic changes of the white matter. Electronically Signed   By: Keane Police D.O.   On: 12/19/2021 15:35    Procedures Procedures    Medications Ordered in ED Medications - No data to display  ED Course/ Medical Decision Making/ A&P                           Medical Decision Making Amount and/or Complexity of Data Reviewed Independent Historian: guardian    Details: Adult child Labs: ordered. Decision-making details documented in ED Course. Radiology: ordered and independent interpretation performed. Decision-making details documented in ED Course. ECG/medicine tests: ordered and independent interpretation performed. Decision-making details documented in ED Course.  Risk Decision regarding hospitalization.     Patient presents the Emergency Department hemodynamically stable, afebrile after a bystander reported 10-minute single episode followed by a right facial droop.  She is completely neurologically intact at the time of presentation.  Differential diagnosis includes high-grade syncope secondary to valvular  dysfunction versus arrhythmia versus less likely syncope secondary to CVA affecting cranial nerve X distribution.  With regards to the patient's facial droop feels that the patient experienced a TIA given his complete resolution at this time.  Patient did have a headache at the time of her syncopal episode concerning for bleed.  I have personally reviewed and interpreted the patient's CT head which was grossly unremarkable and without evidence of acute bleed.  I have personally reviewed and interpreted the patient's EKG which reveals normal sinus rhythm with a ventricular rate of 71 bpm.  No ischemic changes.  Given prior urinary tract infections will obtain urinalysis to determine if patient has associated  urinary tract infection  We will order MRI given transient facial droop that is resolved most consistent with potential TIA.  We will admit to medicine for 24 hours of telemetry, echo and further work-up in the setting of high risk syncope.  Patient admitted to medicine in stable condition.    Final Clinical Impression(s) / ED Diagnoses Final diagnoses:  Syncope and collapse    Rx / DC Orders ED Discharge Orders     None         Levie Heritage, MD 12/19/21 Dionicia Abler    Carmin Muskrat, MD 12/20/21 [phone]

## 2021-12-19 NOTE — ED Notes (Signed)
Patient transported to MRI 

## 2021-12-19 NOTE — ED Notes (Signed)
Pt returned from MRI °

## 2021-12-19 NOTE — ED Provider Triage Note (Signed)
Emergency Medicine Provider Triage Evaluation Note  Kerri [name] , a 88 y.o. female  was evaluated in triage.  Pt complains of strokelike symptoms onset around 10 AM this morning.  With right-sided facial droop, drooling, weakness.  Symptoms lasted about 10 minutes.  They were witnessed by fire department.  Symptoms resolved upon arrival of the EMS.  Currently patient is at her baseline.  No neurological deficits noted on exam.  Per daughter who is at bedside she reports when she places a mild facial droop on the right side.  Smile is symmetrical.  Speech is normal.  Review of Systems  Positive: As above Negative: As above  Physical Exam  BP 123/76 (BP Location: Left Arm)   Pulse 81   Temp 98.1 F (36.7 C) (Oral)   Resp 16   SpO2 99%  Gen:   Awake, no distress   Resp:  Normal effort  MSK:   Moves extremities without difficulty  Other:  Cranial nerves III through XII intact.  Without pronator drift.  EOMs intact.  Without dysarthria.  Full range of motion bilateral upper and lower extremities with intact strength and extensor and flexor muscle groups.  Sensation intact and symmetrical.  Medical Decision Making  Medically screening exam initiated at 12:21 PM.  Appropriate orders placed.  Tiersa Dayley was informed that the remainder of the evaluation will be completed by another provider, this initial triage assessment does not replace that evaluation, and the importance of remaining in the ED until their evaluation is complete.  Given patient has had improvement in almost all of her symptoms and she is at her baseline at this point.  We will defer code stroke.  Will order CT head, and labs.  Discussed with patient and daughter and they are in agreement.  Notified nursing staff that patient needs a room as soon as possible.   Evlyn Courier, PA-C 12/19/21 1223

## 2021-12-19 NOTE — ED Notes (Signed)
ED TO INPATIENT HANDOFF REPORT  ED Nurse Name and Phone #: Adib Wahba  S Name/Age/Gender Kerri [name] 88 y.o. female Room/Bed: 016C/016C  Code Status   Code Status: DNR  Home/SNF/Other Nursing Home Patient oriented to: self and place and time  Is this baseline? Yes   Triage Complete: Triage complete  Chief Complaint Syncope and collapse [R55]  Triage Note Pt states she had a sudden onset of a bad headache at 9am today. Pt is in the process of being moved into independent living care this morning from her home with family. Pt felt dizzy when the headache started. EMS was called, and fire arrive first they noticed right sided facial droop, drooling. Pt was not very responsive for about 10 min. EMS arrived and pt was herself. Still has right sided facial droop per pt's dtr.  BP 110/64 HR 84 98% on room air CBG 120   Allergies Allergies  Allergen Reactions   Buprenorphine Hcl Itching and Other (See Comments)    Happened awhile back- patient noted that any narcotic-based medication makes her have a "bad reaction" Other reaction(s): Other Happened a while back. Patient noted that any narcotic based medication makes her have a bad reaction. Happened a while back. Patient noted that any narcotic based medication makes her have a bad reaction. Happened awhile back- patient noted that any narcotic-based medication makes her have a "bad reaction"   Codeine Nausea And Vomiting and Other (See Comments)    Patient gets sick to her stomach Other reaction(s): nausea   Morphine Itching and Other (See Comments)    Happened awhile back- patient noted that any narcotic-based medication makes her have a "bad reaction" Other reaction(s): itching, Other Happened a while back. Patient noted that any narcotic based medication makes her have a bad reaction. Happened awhile back- patient noted that any narcotic-based medication makes her have a "bad reaction"    Morphine And Related Itching and Other  (See Comments)    Happened awhile back- patient noted that any narcotic-based medication makes her have a "bad reaction"   Tramadol Nausea And Vomiting and Other (See Comments)    Confusion and severe headaches, also   Adhesive [Tape] Other (See Comments)    Tears the skin    Doxycycline Hyclate Other (See Comments)    Makes the patient "ill" Other reaction(s): ill, Other Makes the patient "ill"    Hydrocodone-Acetaminophen Nausea And Vomiting    Other reaction(s): Other   Other Other (See Comments)    Other reaction(s): headache, dizzy and nausea Other reaction(s): Other   Oxycodone Hcl     Other reaction(s): Unknown   Tapentadol Other (See Comments)    Dizziness with doses higher than 50 mg twice a day Other reaction(s): Other Dizziness with doses higher than 50 mg twice a day   Tapentadol Hcl     Other reaction(s): Other   Tramadol Hcl Other (See Comments)    Other reaction(s): nausea, vomiting   Hydrocodone-Acetaminophen Nausea And Vomiting   Lactose Intolerance (Gi) Nausea Only and Other (See Comments)    GI upset    Oxycodone Nausea And Vomiting    Other reaction(s): nausea, vomiting    Level of Care/Admitting Diagnosis ED Disposition     ED Disposition  Admit   Condition  --   Comment  Hospital Area: [facility] [[id number]]  Level of Care: Telemetry Medical [[id number]]  May place patient in observation at [facility] or [place] if equivalent level of care is  available:: No  Covid Evaluation: Asymptomatic - no recent exposure (last 10 days) testing not required  Diagnosis: Syncope and collapse [780.2.ICD-9-CM]  Admitting Physician: Karmen Bongo [[id number]]  Attending Physician: Karmen Bongo [[id number]]          B Medical/Surgery History Past Medical History:  Diagnosis Date   Adenomatous colon polyp    Asthma    Basal cell carcinoma 03/13/2017   left forehead - CX3 + cautery + 5FU   CAD (coronary artery disease)    Chest CTA 10/07: 40% or  less pLAD;  ETT 8/09: negative   Carotid bruit    Chronic back pain    Depression    Diverticulosis    DJD (degenerative joint disease)    Dyslipidemia    Dysrhythmia    pvcs   Fatty liver    GERD (gastroesophageal reflux disease)    HTN (hypertension)    Hypothyroidism    IBS (irritable bowel syndrome)    Internal hemorrhoid    Melanoma ([place])    metastatic; left leg; s/p multiple excisions   Migraine    Osteoporosis    Spinal stenosis    Squamous cell carcinoma of skin 09/02/2015   mid chest - CX3 + 5FU   Squamous cell carcinoma of skin 11/07/2017   left wrist - tx p bx   Squamous cell carcinoma of skin 12/11/2017   left hand - tx p bx   Past Surgical History:  Procedure Laterality Date   BACK SURGERY     BLEPHAROPLASTY     BUNIONECTOMY WITH HAMMERTOE RECONSTRUCTION Right 01/16/2013   Procedure: RIGHT Barbie Banner OSTEOTOMY WITH SIMPLE BUNIONECTOMY AND SECOND HAMMER TOE CORRECTION;  Surgeon: Wylene Simmer, MD;  Location: [facility];  Service: Orthopedics;  Laterality: Right;   CARPAL TUNNEL RELEASE     FEMUR IM NAIL Right 08/10/2021   Procedure: INTRAMEDULLARY (IM) NAIL FEMORAL;  Surgeon: Rod Can, MD;  Location: [facility];  Service: Orthopedics;  Laterality: Right;   KNEE ARTHROSCOPY Right    LUMBAR LAMINECTOMY/DECOMPRESSION MICRODISCECTOMY N/A 11/13/2013   Procedure: LUMBAR TWO-THREE,LUMBAR THREE-FOUR,LUMBAR FOUR-FIVE LAMINECTOMY AND FORAMINOTOMY;  Surgeon: Ophelia Charter, MD;  Location: [facility];  Service: Neurosurgery;  Laterality: N/A;   MELANOMA EXCISION Left 09/19/2012   Procedure: WIDE EXCISION METASTATIC MELANOMA LEFT MEDIAL THIGH;  Surgeon: Zenovia Jarred, MD;  Location: [facility];  Service: General;  Laterality: Left;   ROTATOR CUFF REPAIR     SHOULDER SURGERY     TOE SURGERY     TOTAL HIP ARTHROPLASTY  09/04/2011   Procedure: TOTAL HIP ARTHROPLASTY;  Surgeon: Gearlean Alf, MD;  Location: [facility];  Service: Orthopedics;   Laterality: Left;   TOTAL HIP REVISION Left 11/27/2018   Procedure: Left hip femoral revision;  Surgeon: Gaynelle Arabian, MD;  Location: [facility];  Service: Orthopedics;  Laterality: Left;  110mn   TOTAL KNEE ARTHROPLASTY Right 10/06/2019   Procedure: TOTAL KNEE ARTHROPLASTY;  Surgeon: AGaynelle Arabian MD;  Location: [facility];  Service: Orthopedics;  Laterality: Right;  521m     A IV Location/Drains/Wounds Patient Lines/Drains/Airways Status     Active Line/Drains/Airways     Name Placement date Placement time Site Days   Peripheral IV 09/10/21 20 G 1.88" Anterior;Right Forearm 09/10/21  0610  Forearm  100   Peripheral IV 12/19/21 20 G Right Antecubital 12/19/21  1830  Antecubital  less than 1   Incision (Closed) 08/10/21 Hip Right 08/10/21  2222  -- 131   Pressure  Injury 09/09/21 Sacrum Medial;Lower Unstageable - Full thickness tissue loss in which the base of the injury is covered by slough (yellow, tan, gray, green or brown) and/or eschar (tan, brown or black) in the wound bed. 09/09/21  1600  -- 101   Wound / Incision (Open or Dehisced) 08/09/21  Sacrum Medial Black and bleeding 08/09/21  2345  Sacrum  132            Intake/Output Last 24 hours No intake or output data in the 24 hours ending 12/19/21 2024  Labs/Imaging Results for orders placed or performed during the hospital encounter of 12/19/21 (from the past 48 hour(s))  CBC with Differential     Status: Abnormal   Collection Time: 12/19/21 12:23 PM  Result Value Ref Range   WBC 7.5 4.0 - 10.5 K/uL   RBC 4.11 3.87 - 5.11 MIL/uL   Hemoglobin 11.4 (L) 12.0 - 15.0 g/dL   HCT 35.9 (L) 36.0 - 46.0 %   MCV 87.3 80.0 - 100.0 fL   MCH 27.7 26.0 - 34.0 pg   MCHC 31.8 30.0 - 36.0 g/dL   RDW 14.8 11.5 - 15.5 %   Platelets 216 150 - 400 K/uL   nRBC 0.0 0.0 - 0.2 %   Neutrophils Relative % 64 %   Neutro Abs 4.8 1.7 - 7.7 K/uL   Lymphocytes Relative 28 %   Lymphs Abs 2.1 0.7 - 4.0 K/uL   Monocytes Relative 7 %   Monocytes  Absolute 0.5 0.1 - 1.0 K/uL   Eosinophils Relative 1 %   Eosinophils Absolute 0.1 0.0 - 0.5 K/uL   Basophils Relative 0 %   Basophils Absolute 0.0 0.0 - 0.1 K/uL   Immature Granulocytes 0 %   Abs Immature Granulocytes 0.02 0.00 - 0.07 K/uL    Comment: Performed at [facility], [address], [place], [place] [address]  Basic metabolic panel     Status: None   Collection Time: 12/19/21 12:23 PM  Result Value Ref Range   Sodium 140 135 - 145 mmol/L   Potassium 3.9 3.5 - 5.1 mmol/L   Chloride 108 98 - 111 mmol/L   CO2 24 22 - 32 mmol/L   Glucose, Bld 91 70 - 99 mg/dL    Comment: Glucose reference range applies only to samples taken after fasting for at least 8 hours.   BUN 19 8 - 23 mg/dL   Creatinine, Ser 0.84 0.44 - 1.00 mg/dL   Calcium 9.5 8.9 - 10.3 mg/dL   GFR, Estimated >60 >60 mL/min    Comment: (NOTE) Calculated using the CKD-EPI Creatinine Equation (2021)    Anion gap 8 5 - 15    Comment: Performed at [facility] [address], [place], [place] [address]  Protime-INR     Status: None   Collection Time: 12/19/21 12:23 PM  Result Value Ref Range   Prothrombin Time 14.1 11.4 - 15.2 seconds   INR 1.1 0.8 - 1.2    Comment: (NOTE) INR goal varies based on device and disease states. Performed at [facility], [place] [address] [place], [place] [address]   I-stat chem 8, ED (not at [facility] or [facility])     Status: Abnormal   Collection Time: 12/19/21 12:45 PM  Result Value Ref Range   Sodium 141 135 - 145 mmol/L   Potassium 3.9 3.5 - 5.1 mmol/L   Chloride 104 98 - 111 mmol/L   BUN 21 8 - 23  mg/dL   Creatinine, Ser 0.80 0.44 - 1.00 mg/dL   Glucose, Bld 88 70 - 99 mg/dL    Comment: Glucose reference range applies only to samples taken after fasting for at least 8 hours.   Calcium, Ion 1.18 1.15 - 1.40 mmol/L   TCO2 25 22 - 32 mmol/L   Hemoglobin 11.9 (L) 12.0 - 15.0 g/dL   HCT 35.0 (L) 36.0 - 46.0 %   MR BRAIN WO CONTRAST  Result Date: 12/19/2021 CLINICAL  DATA:  Transient ischemic attack. Headache and facial droop. EXAM: MRI HEAD WITHOUT CONTRAST TECHNIQUE: Multiplanar, multiecho pulse sequences of the brain and surrounding structures were obtained without intravenous contrast. COMPARISON:  Head CT same day.  MRI 09/09/2021. FINDINGS: Brain: Diffusion imaging does not show any acute or subacute infarction. Chronic small-vessel ischemic changes affect the pons. No focal cerebellar insult. Cerebral hemispheres show generalized atrophy. There is an old lacunar infarction in the left basal ganglia. Mild chronic small-vessel ischemic changes affect the hemispheric white matter. No large vessel territory stroke. No acute hemorrhage, hydrocephalus or extra-axial collection. Vascular: Major vessels at the base of the brain show flow. Skull and upper cervical spine: Negative Sinuses/Orbits: Clear/normal Other: None IMPRESSION: No acute MR finding. Chronic small-vessel ischemic changes of the pons. Old lacunar infarction left basal ganglia. Chronic small-vessel ischemic changes of the cerebral hemispheric white matter. Electronically Signed   By: Nelson Chimes M.D.   On: 12/19/2021 19:54   CT Head Wo Contrast  Result Date: 12/19/2021 CLINICAL DATA:  Transient ischemic attack. Sudden onset of bad headache. Dizziness. EXAM: CT HEAD WITHOUT CONTRAST TECHNIQUE: Contiguous axial images were obtained from the base of the skull through the vertex without intravenous contrast. RADIATION DOSE REDUCTION: This exam was performed according to the departmental dose-optimization program which includes automated exposure control, adjustment of the mA and/or kV according to patient size and/or use of iterative reconstruction technique. COMPARISON:  MRI examination dated September 09, 2021 FINDINGS: Brain: No evidence of acute infarction, hemorrhage, hydrocephalus, extra-axial collection or mass lesion/mass effect. Hypodensity of the left basal ganglia consistent with chronic infarct,  unchanged. Moderate cerebral atrophy and chronic microvascular ischemic changes of the white matter. Vascular: No hyperdense vessel or unexpected calcification. Skull: Normal. Negative for fracture or focal lesion. Sinuses/Orbits: No acute finding. Other: None. IMPRESSION: 1. No acute intracranial abnormality. 2. Stable chronic findings including cerebral volume loss chronic left basal ganglia infarct and microvascular ischemic changes of the white matter. Electronically Signed   By: Keane Police D.O.   On: 12/19/2021 15:35    Pending Labs Unresulted Labs (From admission, onward)     Start     Ordered   12/19/21 1744  Remove and replace urinary cath (placed > 5 days) then obtain urine culture from new indwelling urinary catheter.  (Urine Culture)  Once,   R       Question:  Indication  Answer:  Altered mental status (if no other cause identified)   12/19/21 1745   12/19/21 1622  Urinalysis, Routine w reflex microscopic Urine, Clean Catch  Once,   URGENT        12/19/21 1623            Vitals/Pain Today's Vitals   12/19/21 1642 12/19/21 1645 12/19/21 1845 12/19/21 1936  BP:  (!) 124/91 (!) 140/69   Pulse:  75 67   Resp:  16 16   Temp:    98.1 F (36.7 C)  TempSrc:    Oral  SpO2:  97% 99%   PainSc: 0-No pain       Isolation Precautions No active isolations  Medications Medications  tapentadol (NUCYNTA) tablet 50 mg (has no administration in time range)  rosuvastatin (CRESTOR) tablet 10 mg (has no administration in time range)  levothyroxine (SYNTHROID) tablet 75 mcg (has no administration in time range)  gabapentin (NEURONTIN) capsule 100 mg (has no administration in time range)  fluticasone (FLONASE) 50 MCG/ACT nasal spray 2 spray (has no administration in time range)  loratadine (CLARITIN) tablet 10 mg (has no administration in time range)  enoxaparin (LOVENOX) injection 40 mg (has no administration in time range)  sodium chloride flush (NS) 0.9 % injection 3 mL (has no  administration in time range)  acetaminophen (TYLENOL) tablet 650 mg (has no administration in time range)    Or  acetaminophen (TYLENOL) suppository 650 mg (has no administration in time range)  ondansetron (ZOFRAN) tablet 4 mg (has no administration in time range)    Or  ondansetron (ZOFRAN) injection 4 mg (has no administration in time range)  trimethoprim (TRIMPEX) tablet 100 mg (has no administration in time range)    Mobility walks with person assist Low fall risk   Focused Assessments Neuro Assessment Handoff:  Swallow screen pass? Yes          Neuro Assessment:   Neuro Checks:      Last Documented NIHSS Modified Score:   Has TPA been given? No If patient is a Neuro Trauma and patient is going to OR before floor call report to [facility] nurse: [phone] or [phone]   R Recommendations: See Admitting Provider Note  Report given to:   Additional Notes:

## 2021-12-19 NOTE — H&P (Signed)
History and Physical    Patient: Kerri [name] [id number]:[id number] DOB: January 26, 1930 DOA: 12/19/2021 DOS: the patient was seen and examined on 12/19/2021 PCP: Lavone Orn, MD  Patient coming from: Home - lives with daughter and son-in-law since her fall in March; [place]: Daughter, Isidore Moos, [phone]   Chief Complaint: Headache, facial droop  HPI: Kerri [name] is a 88 y.o. female with medical history significant of CAD; AS; chronic back pain; HLD; HTN; CVA; and hypothyroidism presenting with headache and facial droop. She was getting ready to go to ILF [facility]), wanted to move and she was packing and nervous.  She had a caregiver and reported headache (unusual for her) across her forehead and she was dizzy.  She laid down on the bed and felt better.  They went outside to sit by the pool but the headache was very severe and she was very dizzy.  She thinks she must have passed out for maybe 10 minutes.  Her symptoms have resolved spontaneously.  No n/w/t of arms or legs.  She was told that her R lip was drooping a little.  No dysphagia or dysarthria.  She does have excessive saliva.  She previously had a foley for several months for urinary retention but does not currently have one; she takes daily trimethoprim for prophylaxis.    ER Course:   High-risk syncope, ?TIA.  Witnessed event in a chair, lasted 10 minutes.  R facial droop upon awakening, now resolved.  Needs syncope/TIA evaluation.     Review of Systems: As mentioned in the history of present illness. All other systems reviewed and are negative. Past Medical History:  Diagnosis Date   Adenomatous colon polyp    Asthma    Basal cell carcinoma 03/13/2017   left forehead - CX3 + cautery + 5FU   CAD (coronary artery disease)    Chest CTA 10/07: 40% or less pLAD;  ETT 8/09: negative   Carotid bruit    Chronic back pain    Depression    Diverticulosis    DJD (degenerative joint disease)    Dyslipidemia    Dysrhythmia    pvcs    Fatty liver    GERD (gastroesophageal reflux disease)    HTN (hypertension)    Hypothyroidism    IBS (irritable bowel syndrome)    Internal hemorrhoid    Melanoma ([place])    metastatic; left leg; s/p multiple excisions   Migraine    Osteoporosis    Spinal stenosis    Squamous cell carcinoma of skin 09/02/2015   mid chest - CX3 + 5FU   Squamous cell carcinoma of skin 11/07/2017   left wrist - tx p bx   Squamous cell carcinoma of skin 12/11/2017   left hand - tx p bx   Past Surgical History:  Procedure Laterality Date   BACK SURGERY     BLEPHAROPLASTY     BUNIONECTOMY WITH HAMMERTOE RECONSTRUCTION Right 01/16/2013   Procedure: RIGHT Barbie Banner OSTEOTOMY WITH SIMPLE BUNIONECTOMY AND SECOND HAMMER TOE CORRECTION;  Surgeon: Wylene Simmer, MD;  Location: [place];  Service: Orthopedics;  Laterality: Right;   CARPAL TUNNEL RELEASE     FEMUR IM NAIL Right 08/10/2021   Procedure: INTRAMEDULLARY (IM) NAIL FEMORAL;  Surgeon: Rod Can, MD;  Location: [facility];  Service: Orthopedics;  Laterality: Right;   KNEE ARTHROSCOPY Right    LUMBAR LAMINECTOMY/DECOMPRESSION MICRODISCECTOMY N/A 11/13/2013   Procedure: LUMBAR TWO-THREE,LUMBAR THREE-FOUR,LUMBAR FOUR-FIVE LAMINECTOMY AND FORAMINOTOMY;  Surgeon: Ophelia Charter, MD;  Location:  [place] [facility];  Service: Neurosurgery;  Laterality: N/A;   MELANOMA EXCISION Left 09/19/2012   Procedure: WIDE EXCISION METASTATIC MELANOMA LEFT MEDIAL THIGH;  Surgeon: Zenovia Jarred, MD;  Location: [place];  Service: General;  Laterality: Left;   ROTATOR CUFF REPAIR     SHOULDER SURGERY     TOE SURGERY     TOTAL HIP ARTHROPLASTY  09/04/2011   Procedure: TOTAL HIP ARTHROPLASTY;  Surgeon: Gearlean Alf, MD;  [facility]: [facility];  Service: Orthopedics;  Laterality: Left;   TOTAL HIP REVISION Left 11/27/2018   Procedure: Left hip femoral revision;  Surgeon: Gaynelle Arabian, MD;  [facility]: [facility];  Service: Orthopedics;  Laterality: Left;  139mn    TOTAL KNEE ARTHROPLASTY Right 10/06/2019   Procedure: TOTAL KNEE ARTHROPLASTY;  Surgeon: AGaynelle Arabian MD;  Location: [facility];  Service: Orthopedics;  Laterality: Right;  563m   Social History:  reports that she quit smoking about 73 years ago. Her smoking use included cigarettes. She has never used smokeless tobacco. She reports current alcohol use of about 2.0 standard drinks of alcohol per week. She reports that she does not use drugs.  Allergies  Allergen Reactions   Buprenorphine Hcl Itching and Other (See Comments)    Happened awhile back- patient noted that any narcotic-based medication makes her have a "bad reaction" Other reaction(s): Other Happened a while back. Patient noted that any narcotic based medication makes her have a bad reaction. Happened a while back. Patient noted that any narcotic based medication makes her have a bad reaction. Happened awhile back- patient noted that any narcotic-based medication makes her have a "bad reaction"   Codeine Nausea And Vomiting and Other (See Comments)    Patient gets sick to her stomach Other reaction(s): nausea   Morphine Itching and Other (See Comments)    Happened awhile back- patient noted that any narcotic-based medication makes her have a "bad reaction" Other reaction(s): itching, Other Happened a while back. Patient noted that any narcotic based medication makes her have a bad reaction. Happened awhile back- patient noted that any narcotic-based medication makes her have a "bad reaction"    Morphine And Related Itching and Other (See Comments)    Happened awhile back- patient noted that any narcotic-based medication makes her have a "bad reaction"   Tramadol Nausea And Vomiting and Other (See Comments)    Confusion and severe headaches, also   Adhesive [Tape] Other (See Comments)    Tears the skin    Doxycycline Hyclate Other (See Comments)    Makes the patient "ill" Other reaction(s): ill, Other Makes the patient  "ill"    Hydrocodone-Acetaminophen Nausea And Vomiting    Other reaction(s): Other   Other Other (See Comments)    Other reaction(s): headache, dizzy and nausea Other reaction(s): Other   Oxycodone Hcl     Other reaction(s): Unknown   Tapentadol Other (See Comments)    Dizziness with doses higher than 50 mg twice a day Other reaction(s): Other Dizziness with doses higher than 50 mg twice a day   Tapentadol Hcl     Other reaction(s): Other   Tramadol Hcl Other (See Comments)    Other reaction(s): nausea, vomiting   Hydrocodone-Acetaminophen Nausea And Vomiting   Lactose Intolerance (Gi) Nausea Only and Other (See Comments)    GI upset    Oxycodone Nausea And Vomiting    Other reaction(s): nausea, vomiting    Family History  Problem Relation Age of Onset  CAD Mother    CAD Father    Kidney disease Child     Prior to Admission medications   Medication Sig Start Date End Date Taking? Authorizing Provider  acetaminophen (TYLENOL) 500 MG tablet Take 500 mg by mouth 3 (three) times daily.    [provider]  alendronate (FOSAMAX) 70 MG tablet Take 70 mg by mouth every Sunday. 11/15/18   [provider]  Biotin 5 MG CAPS Take 5 mg by mouth daily.    [provider]  calcium carbonate (OS-CAL) 1250 (500 Ca) MG chewable tablet Chew 2 tablets by mouth daily.    [provider]  Cholecalciferol (VITAMIN D3) 50 MCG (2000 UT) TABS Take 2,000 Units by mouth daily.    [provider]  clobetasol (OLUX) 0.05 % topical foam Apply topically 2 (two) times daily. 11/24/21   Sheffield, Vida Roller R, PA-C  collagenase (SANTYL) 250 UNIT/GM ointment Apply topically daily. Apply to sacral Unstageable Pressure injury in a 1/8 inch layer once daily and PRN soiling of dressing indivating replacement 09/12/21   Aline August, MD  erythromycin ophthalmic ointment Place 1 application. into both eyes daily as needed (eye infections).    [provider]   estradiol (ESTRACE) 0.1 MG/GM vaginal cream Place 1 Applicatorful vaginally every other day. 06/06/19   [provider]  ferrous sulfate 325 (65 FE) MG tablet Take 1 tablet (325 mg total) by mouth daily with breakfast. 08/15/21   Antonieta Pert, MD  Flurandrenolide [facility]) 4 MCG/SQCM TAPE Apply 1 each topically daily as needed (eczema dermatitis). 09/12/21   Aline August, MD  fluticasone (FLONASE) 50 MCG/ACT nasal spray Place 2 sprays into both nostrils every morning.  03/29/19   [provider]  folic acid (FOLVITE) 1 MG tablet Take 1 tablet (1 mg total) by mouth daily. 09/12/21   Aline August, MD  gabapentin (NEURONTIN) 100 MG capsule Take 100 mg by mouth 3 (three) times daily.    [provider]  levothyroxine (SYNTHROID, LEVOTHROID) 75 MCG tablet Take 75 mcg by mouth daily with breakfast.    [provider]  loratadine (CLARITIN) 10 MG tablet Take 10 mg by mouth daily.    [provider]  metoprolol succinate (TOPROL-XL) 25 MG 24 hr tablet Take 0.5 tablets (12.5 mg total) by mouth daily. 09/12/21   Aline August, MD  NUCYNTA 50 MG tablet Take 50 mg by mouth 2 (two) times daily. 10/28/21   [provider]  Probiotic Product (PROBIOTIC PO) Take 1 capsule by mouth daily.    [provider]  rosuvastatin (CRESTOR) 10 MG tablet Take 10 mg by mouth at bedtime.    [provider]  tretinoin (RETIN-A) 0.1 % cream Apply topically at bedtime. Patient taking differently: Apply 1 application  topically at bedtime. 06/14/21 06/14/22  Robyne Askew R, PA-C  vitamin B-12 (CYANOCOBALAMIN) 500 MCG tablet Take 500 mcg by mouth daily.    [provider]    Physical Exam: Vitals:   12/19/21 1340 12/19/21 1517 12/19/21 1604 12/19/21 1645  BP: 125/75 105/67 130/66 (!) 124/91  Pulse: 80 70 68 75  Resp: '17 16 16 16  '$ Temp: 98 F (36.7 C) 98.2 F (36.8 C)    TempSrc: Oral Oral    SpO2: 99% 98% 98% 97%   General:  Appears calm and  comfortable and is in NAD, very conversant Eyes:  EOMI, normal lids, iris ENT:  grossly normal hearing, lips & tongue, mmm; suboptimal dentition Neck:  no LAD,  masses or thyromegaly Cardiovascular:  RRR, no m/r/g. No LE edema.  Respiratory:   CTA bilaterally with no wheezes/rales/rhonchi.  Normal respiratory effort. Abdomen:  soft, NT, ND Skin:  no rash or induration seen on limited exam Musculoskeletal:  grossly normal tone BUE/BLE, good ROM, no bony abnormality Psychiatric:  grossly normal mood and affect, speech fluent and appropriate, AOx3, very conversant Neurologic:  Loss of R nasolabial fold but she also reports significant filler for cosmetic reasons so facial symmetry may not be fully expected; moves all extremities in coordinated fashion, sensation intact   Radiological Exams on Admission: Independently reviewed - see discussion in A/P where applicable  CT Head Wo Contrast  Result Date: 12/19/2021 CLINICAL DATA:  Transient ischemic attack. Sudden onset of bad headache. Dizziness. EXAM: CT HEAD WITHOUT CONTRAST TECHNIQUE: Contiguous axial images were obtained from the base of the skull through the vertex without intravenous contrast. RADIATION DOSE REDUCTION: This exam was performed according to the departmental dose-optimization program which includes automated exposure control, adjustment of the mA and/or kV according to patient size and/or use of iterative reconstruction technique. COMPARISON:  MRI examination dated September 09, 2021 FINDINGS: Brain: No evidence of acute infarction, hemorrhage, hydrocephalus, extra-axial collection or mass lesion/mass effect. Hypodensity of the left basal ganglia consistent with chronic infarct, unchanged. Moderate cerebral atrophy and chronic microvascular ischemic changes of the white matter. Vascular: No hyperdense vessel or unexpected calcification. Skull: Normal. Negative for fracture or focal lesion. Sinuses/Orbits: No acute finding. Other: None.  IMPRESSION: 1. No acute intracranial abnormality. 2. Stable chronic findings including cerebral volume loss chronic left basal ganglia infarct and microvascular ischemic changes of the white matter. Electronically Signed   By: Keane Police D.O.   On: 12/19/2021 15:35    EKG: Independently reviewed.  NSR with rate 71; nonspecific ST changes with no evidence of acute ischemia   Labs on Admission: I have personally reviewed the available labs and imaging studies at the time of the admission.  Pertinent labs:    Normal BMP Hgb 11.4 - stable INR 1.1   Assessment and Plan: Principal Problem:   Syncope and collapse Active Problems:   Chronic pain disorder   UTI (urinary tract infection)   Hypothyroidism   Hyperlipidemia   Essential hypertension   DNR (do not resuscitate)    Syncope -Patient with prior h/o CVAs presenting with reported syncopal episode lasting up to 10 minutes -There was some question of R facial droop but the patient continues to actively go for dermal filler of the face so this is not entirely clear -This patient is at moderate/high risk for serious outcome and thus should be observed overnight on telemetry in the hospital. -MRI ordered to r/o CVA although this is less likely based on current history -2d echo ordered; she did have an echo in March that showed hyperdynamic LV function and grade 1 diastolic dysfunction -Neuro checks  -PT/OT/ST (cognitive) eval and treat -Hold estradiol, as this may increase stroke risk  Chronic pain -She is on longstanding Nucynta and Neurontin and at her age this may predispose to episodes of unresponsiveness -I have reviewed this patient in the [place] Controlled Substances Reporting System.  She is receiving medications from only one provider and appears to be taking them as prescribed. -She is at high risk of opioid misuse, diversion, or overdose.  -Will continue home meds and not add additional medications at this time.  HTN -Hold  Toprol XL for now for permissive HTN, but likely ok to resume  assuming MRI is negative  HLD -Continue Crestor  Hypothyroidism -Continue Synthroid  UTI -Has had recurrent UTIs -Continues on daily trimethoprim for prevention  DNR -I have discussed code status with the patient and she would prefer to die a natural death should that situation arise. -She will need a gold out of facility DNR form at the time of discharge     Advance Care Planning:   Code Status: DNR   Consults: PT/OT/ST; TOC team  DVT Prophylaxis: Lovenox  Family Communication: None present currently but they were here earlier  Severity of Illness: The appropriate patient status for this patient is OBSERVATION. Observation status is judged to be reasonable and necessary in order to provide the required intensity of service to ensure the patient's safety. The patient's presenting symptoms, physical exam findings, and initial radiographic and laboratory data in the context of their medical condition is felt to place them at decreased risk for further clinical deterioration. Furthermore, it is anticipated that the patient will be medically stable for discharge from the hospital within 2 midnights of admission.   Author: Karmen Bongo, MD 12/19/2021 6:01 PM  For on call review [email or web address].

## 2021-12-19 NOTE — ED Triage Notes (Signed)
Pt states she had a sudden onset of a bad headache at 9am today. Pt is in the process of being moved into independent living care this morning from her home with family. Pt felt dizzy when the headache started. EMS was called, and fire arrive first they noticed right sided facial droop, drooling. Pt was not very responsive for about 10 min. EMS arrived and pt was herself. Still has right sided facial droop per pt's dtr.  BP 110/64 HR 84 98% on room air CBG 120

## 2021-12-20 ENCOUNTER — Observation Stay (HOSPITAL_BASED_OUTPATIENT_CLINIC_OR_DEPARTMENT_OTHER): Payer: Medicare Other

## 2021-12-20 DIAGNOSIS — E039 Hypothyroidism, unspecified: Secondary | ICD-10-CM | POA: Diagnosis not present

## 2021-12-20 DIAGNOSIS — L8990 Pressure ulcer of unspecified site, unspecified stage: Secondary | ICD-10-CM | POA: Insufficient documentation

## 2021-12-20 DIAGNOSIS — R55 Syncope and collapse: Secondary | ICD-10-CM

## 2021-12-20 DIAGNOSIS — I2510 Atherosclerotic heart disease of native coronary artery without angina pectoris: Secondary | ICD-10-CM | POA: Diagnosis not present

## 2021-12-20 DIAGNOSIS — G894 Chronic pain syndrome: Secondary | ICD-10-CM

## 2021-12-20 DIAGNOSIS — I10 Essential (primary) hypertension: Secondary | ICD-10-CM

## 2021-12-20 LAB — ECHOCARDIOGRAM COMPLETE
AR max vel: 0.97 cm2
AV Area VTI: 0.85 cm2
AV Area mean vel: 0.83 cm2
AV Mean grad: 20 mmHg
AV Peak grad: 32.1 mmHg
Ao pk vel: 2.84 m/s
Area-P 1/2: 2.32 cm2
Calc EF: 50.2 %
Height: 65 in
MV VTI: 2.42 cm2
S' Lateral: 3 cm
Single Plane A2C EF: 43.7 %
Single Plane A4C EF: 56.9 %
Weight: 1855.39 oz

## 2021-12-20 NOTE — Evaluation (Signed)
Occupational Therapy Evaluation Patient Details Name: Kerri [name] MRN: [id number] DOB: Apr 08, 1930 Today's Date: 12/20/2021   History of Present Illness Pt is a 88 y/o F presenting to ED On 7/31 with headache and syncopal episode with R facial drooping. MRI negative for acute finding. PMH includes ashtma, CAD, back pain, depression, HLD, GERD, IBS, and hypothyroidism.   Clinical Impression   Pt independent at baseline with ADLs and uses 3 wheeled walker for functional mobility. Pt currently living with daughter, was in the process of moving to ILF when she came to the hospital, plans to d/c to ILF. Pt needing supervision-minA for ADLs, mod I for bed mobility, and min guard for transfers with 3WW. Pt A & O x4, tangential in conversation throughout session. Pt presenting with impairments listed below, will follow acutely. Recommend HHOT at d/c.      Recommendations for follow up therapy are one component of a multi-disciplinary discharge planning process, led by the attending physician.  Recommendations may be updated based on patient status, additional functional criteria and insurance authorization.   Follow Up Recommendations  Home health OT    Assistance Recommended at Discharge Set up Supervision/Assistance  Patient can return home with the following Direct supervision/assist for medications management;Direct supervision/assist for financial management;Assist for transportation;A little help with bathing/dressing/bathroom;A little help with walking and/or transfers;Help with stairs or ramp for entrance    Functional Status Assessment  Patient has had a recent decline in their functional status and demonstrates the ability to make significant improvements in function in a reasonable and predictable amount of time.  Equipment Recommendations  None recommended by OT (pt has all needed DME)    Recommendations for Other Services PT consult     Precautions / Restrictions  Precautions Precautions: Fall Restrictions Weight Bearing Restrictions: No      Mobility Bed Mobility Overal bed mobility: Modified Independent                  Transfers Overall transfer level: Needs assistance Equipment used:  (4MW) Transfers: Sit to/from Stand Sit to Stand: Min guard                  Balance Overall balance assessment: Needs assistance Sitting-balance support: Bilateral upper extremity supported, Feet supported Sitting balance-Leahy Scale: Good     Standing balance support: During functional activity, Reliant on assistive device for balance Standing balance-Leahy Scale: Fair Standing balance comment: static stands at sink                           ADL either performed or assessed with clinical judgement   ADL Overall ADL's : Needs assistance/impaired Eating/Feeding: Supervision/ safety;Sitting   Grooming: Supervision/safety;Sitting   Upper Body Bathing: Supervision/ safety;Sitting;Standing   Lower Body Bathing: Minimal assistance;Sit to/from stand   Upper Body Dressing : Supervision/safety;Sitting   Lower Body Dressing: Minimal assistance;Sitting/lateral leans;Sit to/from stand   Toilet Transfer: Min guard;Ambulation;Regular Toilet (3 wheeled walker) Toilet Transfer Details (indicate cue type and reason): simulated         Functional mobility during ADLs: Min guard (3 wheeled walker)       Vision Baseline Vision/History: 1 Wears glasses Additional Comments: pt reports she is supposed to wear glasses, does not have in hospital, [facility] for basic ADL tasks, will further assess     Perception     Praxis      Pertinent Vitals/Pain Pain Assessment Pain Assessment: No/denies pain     Hand  Dominance     Extremity/Trunk Assessment Upper Extremity Assessment Upper Extremity Assessment: Generalized weakness   Lower Extremity Assessment Lower Extremity Assessment: Generalized weakness   Cervical / Trunk  Assessment Cervical / Trunk Assessment: Normal   Communication Communication Communication: No difficulties   Cognition Arousal/Alertness: Awake/alert Behavior During Therapy: WFL for tasks assessed/performed, Agitated, Impulsive Overall Cognitive Status: No family/caregiver present to determine baseline cognitive functioning                                 General Comments: tangential with conversation     General Comments  VSS on RA    Exercises     Shoulder Instructions      Home Living Family/patient expects to be discharged to:: Private residence Living Arrangements: Alone   Type of Home: House Home Access: Elevator     Home Layout: One level     Bathroom Shower/Tub: Walk-in shower         Home Equipment: Advice worker (2 wheels)   Additional Comments: in the process of moving to ILF      Prior Functioning/Environment Prior Level of Function : Independent/Modified Independent             Mobility Comments: uses 3 wheeled rollator ADLs Comments: ind with ADLs/IADLs, has [place] aide that mainly provides supervision, HHOT and HHPT as well, may bird bath at home; does not drive        OT Problem List: Decreased strength;Decreased range of motion;Decreased activity tolerance;Impaired balance (sitting and/or standing);Decreased safety awareness      OT Treatment/Interventions: Self-care/ADL training;Therapeutic exercise;Energy conservation;DME and/or AE instruction;Therapeutic activities;Cognitive remediation/compensation;Visual/perceptual remediation/compensation;Patient/family education;Balance training    OT Goals(Current goals can be found in the care plan section) Acute Rehab OT Goals Patient Stated Goal: to go home OT Goal Formulation: With patient Time For Goal Achievement: 01/03/22 Potential to Achieve Goals: Good ADL Goals Pt Will Perform Tub/Shower Transfer: Shower transfer;shower seat;ambulating;rolling walker;with  modified independence Additional ADL Goal #1: pt will complete 4 step trailmaking task in prep for ADLs Additional ADL Goal #2: Pt will receive passing score on pillbox assessment in prep for ADLs  OT Frequency: Min 2X/week    Co-evaluation              AM-PAC OT "6 Clicks" Daily Activity     Outcome Measure Help from another person eating meals?: None Help from another person taking care of personal grooming?: A Little Help from another person toileting, which includes using toliet, bedpan, or urinal?: A Little Help from another person bathing (including washing, rinsing, drying)?: A Little Help from another person to put on and taking off regular upper body clothing?: A Little Help from another person to put on and taking off regular lower body clothing?: A Little 6 Click Score: 19   End of Session Equipment Utilized During Treatment: Other (comment) (4UJ) Nurse Communication: Mobility status  Activity Tolerance: Patient tolerated treatment well Patient left: in bed;with call bell/phone within reach;with bed alarm set  OT Visit Diagnosis: Unsteadiness on feet (R26.81);Other abnormalities of gait and mobility (R26.89);Muscle weakness (generalized) (M62.81);Other symptoms and signs involving the nervous system (W11.914)                Time: 7829-5621 OT Time Calculation (min): 31 min Charges:  OT General Charges $OT Visit: 1 Visit OT Evaluation $OT Eval Low Complexity: 1 Low OT Treatments $Therapeutic Activity: 8-22 mins  Lynnda Child, [place],  OTR/L Acute Rehab [phone] - [place] 12/20/2021, 10:15 AM

## 2021-12-20 NOTE — Discharge Summary (Signed)
Physician Discharge Summary  Kerri [name] DZH:[id number] DOB: Mar 13, 1930 DOA: 12/19/2021  PCP: Lavone Orn, MD  Admit date: 12/19/2021 Discharge date: 12/20/2021  Admitted From: Home Disposition:  ILF  Recommendations for Outpatient Follow-up:  Follow up with PCP in 1-2 weeks Please obtain BMP/CBC in one week    Discharge Condition:Stable CODE STATUS:DNR Diet recommendation: Heart Healthy  Brief/Interim Summary:  Kerri [name] is a 88 y.o. female with medical history significant of CAD; AS; chronic back pain; HLD; HTN; CVA; and hypothyroidism presenting with headache and facial droop. She was getting ready to go to ILF [facility]), wanted to move and she was packing and nervous.  She had a caregiver and reported headache (unusual for her) across her forehead and she was dizzy.  She laid down on the bed and felt better.  They went outside to sit by the pool but the headache was very severe and she was very dizzy.  She thinks she must have passed out for maybe 10 minutes.  Her symptoms have resolved spontaneously.  No n/w/t of arms or legs.  She was told that her R lip was drooping a little.  No dysphagia or dysarthria.  She does have excessive saliva.  She previously had a foley for several months for urinary retention but does not currently have one; she takes daily trimethoprim for prophylaxis. -Patient was admitted for further work-up for syncope, to rule out TIA, there was questionable right facial droop upon awakening, resolved   Syncope -Patient with prior h/o CVAs presenting with reported syncopal episode lasting up to 10 minutes -There was some question of R facial droop but the patient continues to actively go for dermal filler of the face so this is not entirely clear -She was admitted for further work-up, overall her work-up came back very reassuring, MRI did show evidence of old CVAs, but no acute ischemic event, she was monitored on telemetry with, with no arrhythmias, heart block  or significant bradycardias, 2D echo was obtained with a preserved EF, there was a moderate asymmetric left ventricular hypertrophy of the basal septal segment, which was present on previous echo, and is known by her cardiologist, as well no significant lab abnormality, she was not orthostatic. -This is most likely provoked by her sitting at the back of her house yesterday in the hot weather, as well stress related from her moving, she was encouraged to avoid the hot weather, and to stay well-hydrated..  Chronic pain -She is on longstanding Nucynta and Neurontin and at her age this may predispose to episodes of unresponsiveness -I have reviewed this patient in the [place] Controlled Substances Reporting System.  She is receiving medications from only one provider and appears to be taking them as prescribed. -She is at high risk of opioid misuse, diversion, or overdose.  -Will continue home meds and not add additional medications at this time.   HTN -Continue with home medications   HLD -Continue Crestor   Hypothyroidism -Continue Synthroid   UTI -Has had recurrent UTIs -Continues on daily trimethoprim for prevention   History of CVA -She is on aspirin and statin already(daughter confirmed patient take baby aspirin daily.    Discharge Diagnoses:  Principal Problem:   Syncope and collapse Active Problems:   Chronic pain disorder   UTI (urinary tract infection)   Hypothyroidism   Hyperlipidemia   Essential hypertension   DNR (do not resuscitate)   Pressure injury of skin    Discharge Instructions  Discharge Instructions  Diet - low sodium heart healthy   Complete by: As directed    Discharge instructions   Complete by: As directed    Follow with Primary MD Lavone Orn, MD in 7 days   Get CBC, CMP, 2 view Chest X ray checked  by Primary MD next visit.    Activity: As tolerated with Full fall precautions use walker/cane & assistance as needed   Disposition  ILF   Diet: Heart Healthy    On your next visit with your primary [name] physician please Get Medicines reviewed and adjusted.   Please request your Prim.MD to go over all Hospital Tests and Procedure/Radiological results at the follow up, please get all Hospital records sent to your Prim MD by signing hospital release before you go home.   If you experience worsening of your admission symptoms, develop shortness of breath, life threatening emergency, suicidal or homicidal thoughts you must seek medical attention immediately by calling 911 or calling your MD immediately  if symptoms less severe.  You Must read complete instructions/literature along with all the possible adverse reactions/side effects for all the Medicines you take and that have been prescribed to you. Take any new Medicines after you have completely understood and accpet all the possible adverse reactions/side effects.   Do not drive, operating heavy machinery, perform activities at heights, swimming or participation in water activities or provide baby sitting services if your were admitted for syncope or siezures until you have seen by Primary MD or a Neurologist and advised to do so again.  Do not drive when taking Pain medications.    Do not take more than prescribed Pain, Sleep and Anxiety Medications  Special Instructions: If you have smoked or chewed Tobacco  in the last 2 yrs please stop smoking, stop any regular Alcohol  and or any Recreational drug use.  Wear Seat belts while driving.   Please note  You were cared for by a hospitalist during your hospital stay. If you have any questions about your discharge medications or the [name] you received while you were in the hospital after you are discharged, you can call the unit and asked to speak with the hospitalist on call if the hospitalist that took [name] of you is not available. Once you are discharged, your primary [name] physician will handle any further medical  issues. Please note that NO REFILLS for any discharge medications will be authorized once you are discharged, as it is imperative that you return to your primary [name] physician (or establish a relationship with a primary [name] physician if you do not have one) for your aftercare needs so that they can reassess your need for medications and monitor your lab values.   Increase activity slowly   Complete by: As directed    No wound [name]   Complete by: As directed       Allergies as of 12/20/2021       Reactions   Buprenorphine Hcl Itching, Other (See Comments)   Happened awhile back- patient noted that any narcotic-based medication makes her have a "bad reaction" Other reaction(s): Other Happened a while back. Patient noted that any narcotic based medication makes her have a bad reaction. Happened a while back. Patient noted that any narcotic based medication makes her have a bad reaction. Happened awhile back- patient noted that any narcotic-based medication makes her have a "bad reaction"   Codeine Nausea And Vomiting, Other (See Comments)   Patient gets sick to her stomach Other reaction(s):  nausea   Morphine Itching, Other (See Comments)   Happened awhile back- patient noted that any narcotic-based medication makes her have a "bad reaction" Other reaction(s): itching, Other Happened a while back. Patient noted that any narcotic based medication makes her have a bad reaction. Happened awhile back- patient noted that any narcotic-based medication makes her have a "bad reaction"   Morphine And Related Itching, Other (See Comments)   Happened awhile back- patient noted that any narcotic-based medication makes her have a "bad reaction"   Tramadol Nausea And Vomiting, Other (See Comments)   Confusion and severe headaches, also   Adhesive [tape] Other (See Comments)   Tears the skin   Doxycycline Hyclate Other (See Comments)   Makes the patient "ill" Other reaction(s): ill, Other Makes the  patient "ill"   Hydrocodone-acetaminophen Nausea And Vomiting   Other reaction(s): Other   Other Other (See Comments)   Other reaction(s): headache, dizzy and nausea Other reaction(s): Other   Oxycodone Hcl    Other reaction(s): Unknown   Tapentadol Other (See Comments)   Dizziness with doses higher than 50 mg twice a day Other reaction(s): Other Dizziness with doses higher than 50 mg twice a day   Tapentadol Hcl    Other reaction(s): Other   Tramadol Hcl Other (See Comments)   Other reaction(s): nausea, vomiting   Hydrocodone-acetaminophen Nausea And Vomiting   Lactose Intolerance (gi) Nausea Only, Other (See Comments)   GI upset   Oxycodone Nausea And Vomiting   Other reaction(s): nausea, vomiting        Medication List     TAKE these medications    alendronate 70 MG tablet Commonly known as: FOSAMAX Take 70 mg by mouth every Sunday.   Biotin 5 MG Caps Take 5 mg by mouth daily.   calcium carbonate 1250 (500 Ca) MG chewable tablet Commonly known as: OS-CAL Chew 2 tablets by mouth daily.   clobetasol 0.05 % topical foam Commonly known as: OLUX Apply topically 2 (two) times daily.   collagenase 250 UNIT/GM ointment Commonly known as: SANTYL Apply topically daily. Apply to sacral Unstageable Pressure injury in a 1/8 inch layer once daily and PRN soiling of dressing indivating replacement   Cordran 4 MCG/SQCM Tape Generic drug: Flurandrenolide Apply 1 each topically daily as needed (eczema dermatitis).   cyanocobalamin 500 MCG tablet Commonly known as: VITAMIN B12 Take 500 mcg by mouth daily.   estradiol 0.1 MG/GM vaginal cream Commonly known as: ESTRACE Place 1 Applicatorful vaginally every other day.   ferrous sulfate 325 (65 FE) MG tablet Take 1 tablet (325 mg total) by mouth daily with breakfast.   fluticasone 50 MCG/ACT nasal spray Commonly known as: FLONASE Place 2 sprays into both nostrils every morning.   folic acid 1 MG tablet Commonly known  as: FOLVITE Take 1 tablet (1 mg total) by mouth daily.   gabapentin 100 MG capsule Commonly known as: NEURONTIN Take 100 mg by mouth 3 (three) times daily.   levothyroxine 75 MCG tablet Commonly known as: SYNTHROID Take 75 mcg by mouth daily with breakfast.   loratadine 10 MG tablet Commonly known as: CLARITIN Take 10 mg by mouth daily.   metoprolol succinate 25 MG 24 hr tablet Commonly known as: TOPROL-XL Take 0.5 tablets (12.5 mg total) by mouth daily.   Nucynta 50 MG tablet Generic drug: tapentadol Take 50 mg by mouth 2 (two) times daily.   rosuvastatin 10 MG tablet Commonly known as: CRESTOR Take 10 mg by mouth at bedtime.  tretinoin 0.1 % cream Commonly known as: RETIN-A Apply topically at bedtime. What changed: how much to take   trimethoprim 100 MG tablet Commonly known as: TRIMPEX Take 100 mg by mouth daily.   Vitamin D3 50 MCG (2000 UT) Tabs Take 2,000 Units by mouth daily.        Follow-up Information     [name], [facility] Follow up.   Specialty: Home Health Services Why: Home Health arranged. They will contact you within 1 to 2 days after discharge to schedule apt Contact information: [address] [place] [address] [phone]                Allergies  Allergen Reactions   Buprenorphine Hcl Itching and Other (See Comments)    Happened awhile back- patient noted that any narcotic-based medication makes her have a "bad reaction" Other reaction(s): Other Happened a while back. Patient noted that any narcotic based medication makes her have a bad reaction. Happened a while back. Patient noted that any narcotic based medication makes her have a bad reaction. Happened awhile back- patient noted that any narcotic-based medication makes her have a "bad reaction"   Codeine Nausea And Vomiting and Other (See Comments)    Patient gets sick to her stomach Other reaction(s): nausea   Morphine Itching and Other (See  Comments)    Happened awhile back- patient noted that any narcotic-based medication makes her have a "bad reaction" Other reaction(s): itching, Other Happened a while back. Patient noted that any narcotic based medication makes her have a bad reaction. Happened awhile back- patient noted that any narcotic-based medication makes her have a "bad reaction"    Morphine And Related Itching and Other (See Comments)    Happened awhile back- patient noted that any narcotic-based medication makes her have a "bad reaction"   Tramadol Nausea And Vomiting and Other (See Comments)    Confusion and severe headaches, also   Adhesive [Tape] Other (See Comments)    Tears the skin    Doxycycline Hyclate Other (See Comments)    Makes the patient "ill" Other reaction(s): ill, Other Makes the patient "ill"    Hydrocodone-Acetaminophen Nausea And Vomiting    Other reaction(s): Other   Other Other (See Comments)    Other reaction(s): headache, dizzy and nausea Other reaction(s): Other   Oxycodone Hcl     Other reaction(s): Unknown   Tapentadol Other (See Comments)    Dizziness with doses higher than 50 mg twice a day Other reaction(s): Other Dizziness with doses higher than 50 mg twice a day   Tapentadol Hcl     Other reaction(s): Other   Tramadol Hcl Other (See Comments)    Other reaction(s): nausea, vomiting   Hydrocodone-Acetaminophen Nausea And Vomiting   Lactose Intolerance (Gi) Nausea Only and Other (See Comments)    GI upset    Oxycodone Nausea And Vomiting    Other reaction(s): nausea, vomiting    Consultations: None   Procedures/Studies: ECHOCARDIOGRAM COMPLETE  Result Date: 12/20/2021    ECHOCARDIOGRAM REPORT   Patient Name:   Kerri [name] Date of Exam: 12/20/2021 Medical Rec #:  [id number]  Height:       65.0 in Accession #:    [id number] Weight:       116.0 lb Date of Birth:  07/12/1929   BSA:          1.569 m Patient Age:    13 years   BP:  128/84 mmHg Patient Gender: F           HR:           60 bpm. Exam Location:  Inpatient Procedure: 2D Echo, Cardiac Doppler and Color Doppler Indications:    Syncope  History:        Patient has prior history of Echocardiogram examinations, most                 recent 08/10/2021. Arrythmias:PVC and Tachycardia,                 Signs/Symptoms:Syncope, Dizziness/Lightheadedness and Shortness                 of Breath; Risk Factors:Hypertension and Dyslipidemia.  Sonographer:    Wenda Low Referring Phys: [place]  1. Left ventricular ejection fraction, by estimation, is 65 to 70%. The left ventricle has normal function. The left ventricle has no regional wall motion abnormalities. There is moderate asymmetric left ventricular hypertrophy of the basal-septal segment. Left ventricular diastolic parameters are consistent with Grade I diastolic dysfunction (impaired relaxation).  2. Right ventricular systolic function is normal. The right ventricular size is normal. There is normal pulmonary artery systolic pressure.  3. The mitral valve is normal in structure. Trivial mitral valve regurgitation. No evidence of mitral stenosis.  4. The aortic valve is calcified. Aortic valve regurgitation is trivial. Mild to moderate aortic valve stenosis. Aortic valve mean gradient measures 20.0 mmHg. DVI of 0.30. Normal LV stroke volume index.  5. The inferior vena cava is normal in size with greater than 50% respiratory variability, suggesting right atrial pressure of 3 mmHg. Comparison(s): Slight decrease in DVI from prior. LV is slightly less vigorous from prior. FINDINGS  Left Ventricle: Left ventricular ejection fraction, by estimation, is 65 to 70%. The left ventricle has normal function. The left ventricle has no regional wall motion abnormalities. The left ventricular internal cavity size was normal in size. There is  moderate asymmetric left ventricular hypertrophy of the basal-septal segment. Left ventricular diastolic parameters are  consistent with Grade I diastolic dysfunction (impaired relaxation). Right Ventricle: The right ventricular size is normal. No increase in right ventricular wall thickness. Right ventricular systolic function is normal. There is normal pulmonary artery systolic pressure. The tricuspid regurgitant velocity is 2.16 m/s, and  with an assumed right atrial pressure of 3 mmHg, the estimated right ventricular systolic pressure is 78.2 mmHg. Left Atrium: Left atrial size was normal in size. Right Atrium: Right atrial size was not well visualized. Pericardium: There is no evidence of pericardial effusion. Mitral Valve: The mitral valve is normal in structure. Trivial mitral valve regurgitation. No evidence of mitral valve stenosis. MV peak gradient, 3.9 mmHg. The mean mitral valve gradient is 1.0 mmHg. Tricuspid Valve: The tricuspid valve is normal in structure. Tricuspid valve regurgitation is not demonstrated. No evidence of tricuspid stenosis. Aortic Valve: The aortic valve is calcified. Aortic valve regurgitation is trivial. Mild to moderate aortic stenosis is present. Aortic valve mean gradient measures 20.0 mmHg. Aortic valve peak gradient measures 32.1 mmHg. Aortic valve area, by VTI measures 0.85 cm. Pulmonic Valve: The pulmonic valve was normal in structure. Pulmonic valve regurgitation is not visualized. No evidence of pulmonic stenosis. Aorta: The aortic root is normal in size and structure. Venous: The inferior vena cava is normal in size with greater than 50% respiratory variability, suggesting right atrial pressure of 3 mmHg. IAS/Shunts: No atrial level shunt detected by color flow Doppler.  LEFT VENTRICLE PLAX 2D LVIDd:         4.60 cm     Diastology LVIDs:         3.00 cm     LV e' medial:    5.87 cm/s LV PW:         1.20 cm     LV E/e' medial:  9.0 LV IVS:        1.40 cm     LV e' lateral:   8.38 cm/s LVOT diam:     2.00 cm     LV E/e' lateral: 6.3 LV SV:         63 LV SV Index:   40 LVOT Area:     3.14 cm   LV Volumes (MOD) LV vol d, MOD A2C: 30.2 ml LV vol d, MOD A4C: 47.8 ml LV vol s, MOD A2C: 17.0 ml LV vol s, MOD A4C: 20.6 ml LV SV MOD A2C:     13.2 ml LV SV MOD A4C:     47.8 ml LV SV MOD BP:      19.4 ml RIGHT VENTRICLE RV Basal diam:  2.80 cm RV Mid diam:    2.40 cm RV S prime:     10.30 cm/s TAPSE (M-mode): 2.0 cm LEFT ATRIUM             Index        RIGHT ATRIUM           Index LA diam:        3.70 cm 2.36 cm/m   RA Area:     12.80 cm LA Vol (A2C):   48.1 ml 30.65 ml/m  RA Volume:   26.30 ml  16.76 ml/m LA Vol (A4C):   43.8 ml 27.91 ml/m LA Biplane Vol: 49.7 ml 31.67 ml/m  AORTIC VALVE                     PULMONIC VALVE AV Area (Vmax):    0.97 cm      PV Vmax:       0.53 m/s AV Area (Vmean):   0.83 cm      PV Peak grad:  1.1 mmHg AV Area (VTI):     0.85 cm AV Vmax:           283.50 cm/s AV Vmean:          206.500 cm/s AV VTI:            0.740 m AV Peak Grad:      32.1 mmHg AV Mean Grad:      20.0 mmHg LVOT Vmax:         87.50 cm/s LVOT Vmean:        54.500 cm/s LVOT VTI:          0.200 m LVOT/AV VTI ratio: 0.27  AORTA Ao Root diam: 3.00 cm Ao Asc diam:  3.30 cm MITRAL VALVE                TRICUSPID VALVE MV Area (PHT): 2.32 cm     TR Peak grad:   18.7 mmHg MV Area VTI:   2.42 cm     TR Vmax:        216.00 cm/s MV Peak grad:  3.9 mmHg MV Mean grad:  1.0 mmHg     SHUNTS MV Vmax:       0.98 m/s     Systemic VTI:  0.20 m MV Vmean:  46.4 cm/s    Systemic Diam: 2.00 cm MV Decel Time: 327 msec MV E velocity: 52.56 cm/s MV A velocity: 103.00 cm/s MV E/A ratio:  0.51 Rudean Haskell MD Electronically signed by Rudean Haskell MD Signature Date/Time: 12/20/2021/3:05:53 PM    Final    MR BRAIN WO CONTRAST  Result Date: 12/19/2021 CLINICAL DATA:  Transient ischemic attack. Headache and facial droop. EXAM: MRI HEAD WITHOUT CONTRAST TECHNIQUE: Multiplanar, multiecho pulse sequences of the brain and surrounding structures were obtained without intravenous contrast. COMPARISON:  Head CT same day.   MRI 09/09/2021. FINDINGS: Brain: Diffusion imaging does not show any acute or subacute infarction. Chronic small-vessel ischemic changes affect the pons. No focal cerebellar insult. Cerebral hemispheres show generalized atrophy. There is an old lacunar infarction in the left basal ganglia. Mild chronic small-vessel ischemic changes affect the hemispheric white matter. No large vessel territory stroke. No acute hemorrhage, hydrocephalus or extra-axial collection. Vascular: Major vessels at the base of the brain show flow. Skull and upper cervical spine: Negative Sinuses/Orbits: Clear/normal Other: None IMPRESSION: No acute MR finding. Chronic small-vessel ischemic changes of the pons. Old lacunar infarction left basal ganglia. Chronic small-vessel ischemic changes of the cerebral hemispheric white matter. Electronically Signed   By: Nelson Chimes M.D.   On: 12/19/2021 19:54   CT Head Wo Contrast  Result Date: 12/19/2021 CLINICAL DATA:  Transient ischemic attack. Sudden onset of bad headache. Dizziness. EXAM: CT HEAD WITHOUT CONTRAST TECHNIQUE: Contiguous axial images were obtained from the base of the skull through the vertex without intravenous contrast. RADIATION DOSE REDUCTION: This exam was performed according to the departmental dose-optimization program which includes automated exposure control, adjustment of the mA and/or kV according to patient size and/or use of iterative reconstruction technique. COMPARISON:  MRI examination dated September 09, 2021 FINDINGS: Brain: No evidence of acute infarction, hemorrhage, hydrocephalus, extra-axial collection or mass lesion/mass effect. Hypodensity of the left basal ganglia consistent with chronic infarct, unchanged. Moderate cerebral atrophy and chronic microvascular ischemic changes of the white matter. Vascular: No hyperdense vessel or unexpected calcification. Skull: Normal. Negative for fracture or focal lesion. Sinuses/Orbits: No acute finding. Other: None.  IMPRESSION: 1. No acute intracranial abnormality. 2. Stable chronic findings including cerebral volume loss chronic left basal ganglia infarct and microvascular ischemic changes of the white matter. Electronically Signed   By: Keane Police D.O.   On: 12/19/2021 15:35      Subjective: He denies any complaints currently, no dyspnea, no chest pain, lightheadedness, orthostatic was done earlier today which was within normal limits.  Discharge Exam: Vitals:   12/20/21 0734 12/20/21 1212  BP: 128/84 (!) 151/92  Pulse: 72 67  Resp: 18 11  Temp: 98.6 F (37 C) 98.4 F (36.9 C)  SpO2:     Vitals:   12/20/21 0431 12/20/21 0437 12/20/21 0734 12/20/21 1212  BP: 124/81 132/74 128/84 (!) 151/92  Pulse:   72 67  Resp: 19 (!) '22 18 11  '$ Temp:   98.6 F (37 C) 98.4 F (36.9 C)  TempSrc:   Oral Oral  SpO2:      Weight:  52.6 kg    Height:  '5\' 5"'$  (1.651 m)      General: Pt is alert, awake, not in acute distress Cardiovascular: RRR, S1/S2 +, no rubs, no gallops Respiratory: CTA bilaterally, no wheezing, no rhonchi Abdominal: Soft, NT, ND, bowel sounds + Extremities: no edema, no cyanosis    The results of significant diagnostics from this hospitalization (including imaging, microbiology,  ancillary and laboratory) are listed below for reference.     Microbiology: No results found for this or any previous visit (from the past 240 hour(s)).   Labs: BNP (last 3 results) No results for input(s): "BNP" in the last 8760 hours. Basic Metabolic Panel: Recent Labs  Lab 12/19/21 1223 12/19/21 1245  NA 140 141  K 3.9 3.9  CL 108 104  CO2 24  --   GLUCOSE 91 88  BUN 19 21  CREATININE 0.84 0.80  CALCIUM 9.5  --    Liver Function Tests: No results for input(s): "AST", "ALT", "ALKPHOS", "BILITOT", "PROT", "ALBUMIN" in the last 168 hours. No results for input(s): "LIPASE", "AMYLASE" in the last 168 hours. No results for input(s): "AMMONIA" in the last 168 hours. CBC: Recent Labs  Lab  12/19/21 1223 12/19/21 1245  WBC 7.5  --   NEUTROABS 4.8  --   HGB 11.4* 11.9*  HCT 35.9* 35.0*  MCV 87.3  --   PLT 216  --    Cardiac Enzymes: No results for input(s): "CKTOTAL", "CKMB", "CKMBINDEX", "TROPONINI" in the last 168 hours. BNP: Invalid input(s): "POCBNP" CBG: No results for input(s): "GLUCAP" in the last 168 hours. D-Dimer No results for input(s): "DDIMER" in the last 72 hours. Hgb A1c No results for input(s): "HGBA1C" in the last 72 hours. Lipid Profile No results for input(s): "CHOL", "HDL", "LDLCALC", "TRIG", "CHOLHDL", "LDLDIRECT" in the last 72 hours. Thyroid function studies No results for input(s): "TSH", "T4TOTAL", "T3FREE", "THYROIDAB" in the last 72 hours.  Invalid input(s): "FREET3" Anemia work up No results for input(s): "VITAMINB12", "FOLATE", "FERRITIN", "TIBC", "IRON", "RETICCTPCT" in the last 72 hours. Urinalysis    Component Value Date/Time   COLORURINE YELLOW 09/09/2021 0433   APPEARANCEUR CLOUDY (A) 09/09/2021 0433   LABSPEC 1.015 09/09/2021 0433   PHURINE 9.0 (H) 09/09/2021 0433   GLUCOSEU NEGATIVE 09/09/2021 0433   HGBUR NEGATIVE 09/09/2021 0433   BILIRUBINUR NEGATIVE 09/09/2021 0433   KETONESUR NEGATIVE 09/09/2021 0433   PROTEINUR 100 (A) 09/09/2021 0433   UROBILINOGEN 0.2 09/01/2011 1350   NITRITE NEGATIVE 09/09/2021 0433   LEUKOCYTESUR LARGE (A) 09/09/2021 0433   Sepsis Labs Recent Labs  Lab 12/19/21 1223  WBC 7.5   Microbiology No results found for this or any previous visit (from the past 240 hour(s)).   Time coordinating discharge: Over 30 minutes  SIGNED:   Phillips Climes, MD  [facility] 12/20/2021, 3:50 PM Pager   If 7PM-7AM, please contact night-coverage [email or web address]

## 2021-12-20 NOTE — Discharge Instructions (Signed)
Follow with Primary MD Lavone Orn, MD in 7 days   Get CBC, CMP, 2 view Chest X ray checked  by Primary MD next visit.    Activity: As tolerated with Full fall precautions use walker/cane & assistance as needed   Disposition ILF   Diet: Heart Healthy    On your next visit with your primary care physician please Get Medicines reviewed and adjusted.   Please request your Prim.MD to go over all Hospital Tests and Procedure/Radiological results at the follow up, please get all Hospital records sent to your Prim MD by signing hospital release before you go home.   If you experience worsening of your admission symptoms, develop shortness of breath, life threatening emergency, suicidal or homicidal thoughts you must seek medical attention immediately by calling 911 or calling your MD immediately  if symptoms less severe.  You Must read complete instructions/literature along with all the possible adverse reactions/side effects for all the Medicines you take and that have been prescribed to you. Take any new Medicines after you have completely understood and accpet all the possible adverse reactions/side effects.   Do not drive, operating heavy machinery, perform activities at heights, swimming or participation in water activities or provide baby sitting services if your were admitted for syncope or siezures until you have seen by Primary MD or a Neurologist and advised to do so again.  Do not drive when taking Pain medications.    Do not take more than prescribed Pain, Sleep and Anxiety Medications  Special Instructions: If you have smoked or chewed Tobacco  in the last 2 yrs please stop smoking, stop any regular Alcohol  and or any Recreational drug use.  Wear Seat belts while driving.   Please note  You were cared for by a hospitalist during your hospital stay. If you have any questions about your discharge medications or the care you received while you were in the hospital after  you are discharged, you can call the unit and asked to speak with the hospitalist on call if the hospitalist that took care of you is not available. Once you are discharged, your primary care physician will handle any further medical issues. Please note that NO REFILLS for any discharge medications will be authorized once you are discharged, as it is imperative that you return to your primary care physician (or establish a relationship with a primary care physician if you do not have one) for your aftercare needs so that they can reassess your need for medications and monitor your lab values.

## 2021-12-20 NOTE — Progress Notes (Signed)
   12/20/21 1300  Orthostatic Lying   BP- Lying 141/75  Pulse- Lying 75  Orthostatic Sitting  BP- Sitting 145/83  Pulse- Sitting 75  Orthostatic Standing at 0 minutes  BP- Standing at 0 minutes (!) 137/115  Pulse- Standing at 0 minutes 82  Orthostatic Standing at 3 minutes  BP- Standing at 3 minutes (!) 170/98  Pulse- Standing at 3 minutes 81   Kayde Atkerson S, PT DPT Acute Rehabilitation Services Pager [phone]  Office [phone]

## 2021-12-20 NOTE — Progress Notes (Signed)
*  PRELIMINARY RESULTS* Echocardiogram 2D Echocardiogram has been performed.  Kerri [name] 12/20/2021, 2:39 PM

## 2021-12-20 NOTE — TOC Transition Note (Signed)
Transition of Care [facility]) - CM/SW Discharge Note   Patient Details  Name: Kerri [name] MRN: [id number] Date of Birth: June 07, 1929  Transition of Care [facility]) CM/SW Contact:  Cyndi Bender, RN Phone Number: 12/20/2021, 12:59 PM   Clinical Narrative:     Spoke to patient at bedside regarding transition needs. Patient currently having home health with [facility]. Resumption orders in place. Cory with [facility] aware. Spoke to daughter, dawn, who will pick up patient once ready. Patient waiting for Echo test.    Final next level of care: [place] Barriers to Discharge: Barriers Resolved   Patient Goals and CMS Choice Patient states their goals for this hospitalization and ongoing recovery are:: return home      Discharge Placement             home          Discharge Plan and Services                          HH Arranged: RN, PT, OT [facility] Agency: [place] Date [place]: 12/20/21 Time [place]: 37 Representative spoke with at [place]: [place] ([place]) Interventions     Readmission Risk Interventions    09/11/2021    2:25 PM  Readmission Risk Prevention Plan  Transportation Screening Complete  PCP or Specialist Appt within 3-5 Days Complete  HRI or [place] Complete  Social Work Consult for [place] Planning/Counseling Complete  Palliative Care Screening Complete

## 2021-12-20 NOTE — Evaluation (Signed)
SLP Cancellation Note  Patient Details Name: Kerri [name] MRN: [id number] DOB: 1929/07/31   Cancelled treatment:       Reason Eval/Treat Not Completed: Other (comment) (pt currently with Dr Waldron [facility], will continue efforts)  Note brain MRI negative.   Kathleen Lime, MS [facility] SLP Acute Rehab Services Office [phone] Pager [phone]  Macario Golds 12/20/2021, 9:40 AM

## 2021-12-20 NOTE — Progress Notes (Signed)
  Transition of Care [facility]) Screening Note   Patient Details  Name: Kerri [name] Date of Birth: 12/24/1929   Transition of Care [facility]) CM/SW Contact:    Cyndi Bender, RN Phone Number: 12/20/2021, 8:35 AM    Transition of Care Department [facility]) has reviewed patient. We will continue to monitor patient advancement through interdisciplinary progression rounds.

## 2021-12-20 NOTE — Evaluation (Signed)
Physical Therapy Evaluation Patient Details Name: Kerri [name] MRN: [id number] DOB: [birth year] Today's Date: 12/20/2021  History of Present Illness  Pt is a 88 y/o F presenting to ED On 7/31 with headache and syncopal episode with R facial drooping. MRI negative for acute finding. PMH includes ashtma, CAD, back pain, depression, HLD, GERD, IBS, and hypothyroidism.  Clinical Impression   Pt presents with generalized weakness, impaired balance with history of falls, and decreased activity tolerance. Pt to benefit from acute PT to address deficits. Pt ambulated short bouts in room with supervision level of assist overall. Pt can be easily distracted, PT assisting pt with room navigation and staying on task at times. Orthostatic vitals negative (see previous note), and pt with no complaints of dizziness during session. PT to progress mobility as tolerated, and will continue to follow acutely.         Recommendations for follow up therapy are one component of a multi-disciplinary discharge planning process, led by the attending physician.  Recommendations may be updated based on patient status, additional functional criteria and insurance authorization.  Follow Up Recommendations Home health PT      Assistance Recommended at Discharge Set up Supervision/Assistance  Patient can return home with the following  A lot of help with bathing/dressing/bathroom;Assist for transportation;Assistance with cooking/housework;Help with stairs or ramp for entrance    Equipment Recommendations None recommended by PT  Recommendations for Other Services       Functional Status Assessment Patient has had a recent decline in their functional status and demonstrates the ability to make significant improvements in function in a reasonable and predictable amount of time.     Precautions / Restrictions Precautions Precautions: Fall Restrictions Weight Bearing Restrictions: No      Mobility  Bed Mobility Overal  bed mobility: Needs Assistance Bed Mobility: Supine to Sit, Sit to Supine     Supine to sit: HOB elevated, Supervision Sit to supine: Supervision, HOB elevated   General bed mobility comments: increased time and effort    Transfers Overall transfer level: Needs assistance Equipment used:  (3ww) Transfers: Sit to/from Stand Sit to Stand: Supervision           General transfer comment: for safety, increased time with improper hand placement on walker handles to stand    Ambulation/Gait Ambulation/Gait assistance: Supervision Gait Distance (Feet): 45 Feet Assistive device: Rollator (4 wheels) (3ww) Gait Pattern/deviations: Step-through pattern, Decreased stride length, Shuffle, Drifts right/left Gait velocity: decr     General Gait Details: for safety, easily distracted in standing tasks.  Stairs            Wheelchair Mobility    Modified Rankin (Stroke Patients Only)       Balance Overall balance assessment: Needs assistance Sitting-balance support: Bilateral upper extremity supported, Feet supported Sitting balance-Leahy Scale: Good     Standing balance support: During functional activity, Reliant on assistive device for balance Standing balance-Leahy Scale: Fair Standing balance comment: can stand statically without UE support                             Pertinent Vitals/Pain Pain Assessment Pain Assessment: Faces Faces Pain Scale: No hurt Pain Intervention(s): Monitored during session    Home Living Family/patient expects to be discharged to:: Private residence Living Arrangements: Alone   Type of Home: House Home Access: Elevator       Home Layout: One level Home Equipment: Advice worker (2 wheels) Additional  Comments: in the process of moving to ILF    Prior Function Prior Level of Function : Independent/Modified Independent             Mobility Comments: uses 3 wheeled rollator ADLs Comments: ind with  ADLs/IADLs, has [place] aide that mainly provides supervision, [place] and HHPT as well, may bird bath at home; does not drive     Hand Dominance   Dominant Hand: Right    Extremity/Trunk Assessment   Upper Extremity Assessment Upper Extremity Assessment: Defer to OT evaluation    Lower Extremity Assessment Lower Extremity Assessment: Generalized weakness    Cervical / Trunk Assessment Cervical / Trunk Assessment: Kyphotic  Communication   Communication: No difficulties  Cognition Arousal/Alertness: Awake/alert Behavior During Therapy: WFL for tasks assessed/performed, Agitated, Impulsive Overall Cognitive Status: No family/caregiver present to determine baseline cognitive functioning                                 General Comments: tangential, can be difficult to keep on task        General Comments      Exercises     Assessment/Plan    PT Assessment Patient needs continued PT services  PT Problem List Decreased strength;Decreased mobility;Decreased activity tolerance;Decreased balance       PT Treatment Interventions DME instruction;Therapeutic activities;Gait training;Therapeutic exercise;Patient/family education;Balance training;Functional mobility training;Neuromuscular re-education    PT Goals (Current goals can be found in the Care Plan section)  Acute Rehab PT Goals Patient Stated Goal: go to ILF PT Goal Formulation: With patient Time For Goal Achievement: 01/03/22 Potential to Achieve Goals: Good    Frequency Min 3X/week     Co-evaluation               AM-PAC PT "6 Clicks" Mobility  Outcome Measure Help needed turning from your back to your side while in a flat bed without using bedrails?: A Little Help needed moving from lying on your back to sitting on the side of a flat bed without using bedrails?: A Little Help needed moving to and from a bed to a chair (including a wheelchair)?: A Little Help needed standing up from a chair  using your arms (e.g., wheelchair or bedside chair)?: A Little Help needed to walk in hospital room?: A Little Help needed climbing 3-5 steps with a railing? : A Little 6 Click Score: 18    End of Session Equipment Utilized During Treatment: Gait belt Activity Tolerance: Patient tolerated treatment well Patient left: in bed;with call bell/phone within reach;with bed alarm set Nurse Communication: Mobility status PT Visit Diagnosis: Other abnormalities of gait and mobility (R26.89);Muscle weakness (generalized) (M62.81)    Time: 2119-4174 PT Time Calculation (min) (ACUTE ONLY): 21 min   Charges:   PT Evaluation $PT Eval Low Complexity: 1 Low         Kari Montero S, PT DPT Acute Rehabilitation Services Pager [phone]  Office [phone]  Merrill E Ruffin Pyo 12/20/2021, 1:15 PM

## 2021-12-29 DIAGNOSIS — N2889 Other specified disorders of kidney and ureter: Secondary | ICD-10-CM | POA: Diagnosis not present

## 2022-01-09 DIAGNOSIS — L89151 Pressure ulcer of sacral region, stage 1: Secondary | ICD-10-CM | POA: Diagnosis not present

## 2022-01-09 DIAGNOSIS — M19041 Primary osteoarthritis, right hand: Secondary | ICD-10-CM | POA: Diagnosis not present

## 2022-01-09 DIAGNOSIS — I10 Essential (primary) hypertension: Secondary | ICD-10-CM | POA: Diagnosis not present

## 2022-01-09 DIAGNOSIS — M170 Bilateral primary osteoarthritis of knee: Secondary | ICD-10-CM | POA: Diagnosis not present

## 2022-01-09 DIAGNOSIS — E785 Hyperlipidemia, unspecified: Secondary | ICD-10-CM | POA: Diagnosis not present

## 2022-01-09 DIAGNOSIS — K219 Gastro-esophageal reflux disease without esophagitis: Secondary | ICD-10-CM | POA: Diagnosis not present

## 2022-01-09 DIAGNOSIS — M160 Bilateral primary osteoarthritis of hip: Secondary | ICD-10-CM | POA: Diagnosis not present

## 2022-01-09 DIAGNOSIS — M810 Age-related osteoporosis without current pathological fracture: Secondary | ICD-10-CM | POA: Diagnosis not present

## 2022-01-09 DIAGNOSIS — M47816 Spondylosis without myelopathy or radiculopathy, lumbar region: Secondary | ICD-10-CM | POA: Diagnosis not present

## 2022-01-09 DIAGNOSIS — E039 Hypothyroidism, unspecified: Secondary | ICD-10-CM | POA: Diagnosis not present

## 2022-01-09 DIAGNOSIS — K5900 Constipation, unspecified: Secondary | ICD-10-CM | POA: Diagnosis not present

## 2022-01-09 DIAGNOSIS — M48061 Spinal stenosis, lumbar region without neurogenic claudication: Secondary | ICD-10-CM | POA: Diagnosis not present

## 2022-01-09 DIAGNOSIS — I2510 Atherosclerotic heart disease of native coronary artery without angina pectoris: Secondary | ICD-10-CM | POA: Diagnosis not present

## 2022-01-09 DIAGNOSIS — Z7982 Long term (current) use of aspirin: Secondary | ICD-10-CM | POA: Diagnosis not present

## 2022-01-09 DIAGNOSIS — M19042 Primary osteoarthritis, left hand: Secondary | ICD-10-CM | POA: Diagnosis not present

## 2022-01-09 DIAGNOSIS — Z960 Presence of urogenital implants: Secondary | ICD-10-CM | POA: Diagnosis not present

## 2022-01-16 ENCOUNTER — Telehealth: Payer: Self-pay | Admitting: Cardiology

## 2022-01-16 NOTE — Telephone Encounter (Signed)
Pt c/o swelling: STAT is pt has developed SOB within 24 hours  If swelling, where is the swelling located? Left leg   How much weight have you gained and in what time?   Have you gained 3 pounds in a day or 5 pounds in a week?   Do you have a log of your daily weights (if so, list)?   Are you currently taking a fluid pill? no  Are you currently SOB? no  Have you traveled recently? No  Pt asked to be seen sooner, so she made an appt 01/26/22

## 2022-01-16 NOTE — Telephone Encounter (Signed)
LMTCB

## 2022-01-25 NOTE — Telephone Encounter (Signed)
Spoke with pt and swelling has improved since started wearing the compression stockings Pt has appt on 9/19 at 1:40 with Dr Percival Spanish will keep this appt ./cy

## 2022-01-26 ENCOUNTER — Ambulatory Visit: Payer: Medicare Other | Admitting: Cardiology

## 2022-02-01 DIAGNOSIS — M25551 Pain in right hip: Secondary | ICD-10-CM | POA: Diagnosis not present

## 2022-02-01 DIAGNOSIS — M25561 Pain in right knee: Secondary | ICD-10-CM | POA: Diagnosis not present

## 2022-02-01 DIAGNOSIS — M25562 Pain in left knee: Secondary | ICD-10-CM | POA: Diagnosis not present

## 2022-02-02 DIAGNOSIS — M6281 Muscle weakness (generalized): Secondary | ICD-10-CM | POA: Diagnosis not present

## 2022-02-03 ENCOUNTER — Ambulatory Visit: Payer: Medicare Other | Admitting: Cardiology

## 2022-02-05 NOTE — Progress Notes (Unsigned)
Cardiology Office Note   Date:  02/07/2022   ID:  Kerri [name], DOB 06-23-1929, MRN [id number]  PCP:  Lavone Orn, MD  Cardiologist:   Minus Breeding, MD   Chief Complaint  Patient presents with   Leg Pain     History of Present Illness: Kerri [name] is a 88 y.o. female who presents for ongoing assessment and management of hypertension, palpitations, with other history to include cluster headaches.  Since I last saw her she saw Coletta Memos NP. This was after a fall and hospitalization.  She was not having any acute cardiac complaints.  During her hospitalization she did have brief runs of NSVT.     I did review the record and it was a question of syncope but she remembers falling.  She had no further syncope.  She is living at the retirement home.  Her husband of 60 years died recently.  She is getting around with a rolling walker.  She is a little bit limited by continued right hip pain.  She has not had any presyncope or syncope.  She has not had any chest pressure or shortness of breath.  She has had no palpitations.   Past Medical History:  Diagnosis Date   Adenomatous colon polyp    Asthma    Basal cell carcinoma 03/13/2017   left forehead - CX3 + cautery + 5FU   CAD (coronary artery disease)    Chest CTA 10/07: 40% or less pLAD;  ETT 8/09: negative   Carotid bruit    Chronic back pain    Depression    Diverticulosis    DJD (degenerative joint disease)    Dyslipidemia    Dysrhythmia    pvcs   Fatty liver    GERD (gastroesophageal reflux disease)    HTN (hypertension)    Hypothyroidism    IBS (irritable bowel syndrome)    Internal hemorrhoid    Melanoma ([place])    metastatic; left leg; s/p multiple excisions   Migraine    Osteoporosis    Spinal stenosis    Squamous cell carcinoma of skin 09/02/2015   mid chest - CX3 + 5FU   Squamous cell carcinoma of skin 11/07/2017   left wrist - tx p bx   Squamous cell carcinoma of skin 12/11/2017   left hand - tx p bx     Past Surgical History:  Procedure Laterality Date   BACK SURGERY     BLEPHAROPLASTY     BUNIONECTOMY WITH HAMMERTOE RECONSTRUCTION Right 01/16/2013   Procedure: RIGHT Barbie Banner OSTEOTOMY WITH SIMPLE BUNIONECTOMY AND SECOND HAMMER TOE CORRECTION;  Surgeon: Wylene Simmer, MD;  Location: [facility];  Service: Orthopedics;  Laterality: Right;   CARPAL TUNNEL RELEASE     FEMUR IM NAIL Right 08/10/2021   Procedure: INTRAMEDULLARY (IM) NAIL FEMORAL;  Surgeon: Rod Can, MD;  Location: [facility];  Service: Orthopedics;  Laterality: Right;   KNEE ARTHROSCOPY Right    LUMBAR LAMINECTOMY/DECOMPRESSION MICRODISCECTOMY N/A 11/13/2013   Procedure: LUMBAR TWO-THREE,LUMBAR THREE-FOUR,LUMBAR FOUR-FIVE LAMINECTOMY AND FORAMINOTOMY;  Surgeon: Ophelia Charter, MD;  Location: [facility];  Service: Neurosurgery;  Laterality: N/A;   MELANOMA EXCISION Left 09/19/2012   Procedure: WIDE EXCISION METASTATIC MELANOMA LEFT MEDIAL THIGH;  Surgeon: Zenovia Jarred, MD;  Location: [facility];  Service: General;  Laterality: Left;   [place]  ARTHROPLASTY  09/04/2011   Procedure: TOTAL HIP ARTHROPLASTY;  Surgeon: Gearlean Alf, MD;  Location: [facility];  Service: Orthopedics;  Laterality: Left;   TOTAL HIP REVISION Left 11/27/2018   Procedure: Left hip femoral revision;  Surgeon: Gaynelle Arabian, MD;  Location: [facility];  Service: Orthopedics;  Laterality: Left;  177mn   TOTAL KNEE ARTHROPLASTY Right 10/06/2019   Procedure: TOTAL KNEE ARTHROPLASTY;  Surgeon: AGaynelle Arabian MD;  Location: [facility];  Service: Orthopedics;  Laterality: Right;  591m     Current Outpatient Medications  Medication Sig Dispense Refill   alendronate (FOSAMAX) 70 MG tablet Take 70 mg by mouth every Sunday.     Biotin 5 MG CAPS Take 5 mg by mouth daily.     Cholecalciferol (VITAMIN D3) 50 MCG (2000 UT) TABS Take 2,000 Units by mouth daily.     estradiol  (ESTRACE) 0.1 MG/GM vaginal cream Place 1 Applicatorful vaginally every other day.     fluticasone (FLONASE) 50 MCG/ACT nasal spray Place 2 sprays into both nostrils every morning.      gabapentin (NEURONTIN) 100 MG capsule Take 100 mg by mouth 3 (three) times daily.     levothyroxine (SYNTHROID, LEVOTHROID) 75 MCG tablet Take 75 mcg by mouth daily with breakfast.     loratadine (CLARITIN) 10 MG tablet Take 10 mg by mouth daily.     rosuvastatin (CRESTOR) 10 MG tablet Take 10 mg by mouth at bedtime.     tretinoin (RETIN-A) 0.1 % cream Apply topically at bedtime. (Patient taking differently: Apply 1 application  topically at bedtime.) 45 g 3   trimethoprim (TRIMPEX) 100 MG tablet Take 100 mg by mouth daily.     calcium carbonate (OS-CAL) 1250 (500 Ca) MG chewable tablet Chew 2 tablets by mouth daily. (Patient not taking: Reported on 02/07/2022)     clobetasol (OLUX) 0.05 % topical foam Apply topically 2 (two) times daily. (Patient not taking: Reported on 02/07/2022) 50 g 4   collagenase (SANTYL) 250 UNIT/GM ointment Apply topically daily. Apply to sacral Unstageable Pressure injury in a 1/8 inch layer once daily and PRN soiling of dressing indivating replacement (Patient not taking: Reported on 12/19/2021) 15 g 0   ferrous sulfate 325 (65 FE) MG tablet Take 1 tablet (325 mg total) by mouth daily with breakfast. (Patient not taking: Reported on 12/19/2021)  3   Flurandrenolide (CORDRAN) 4 MCG/SQCM TAPE Apply 1 each topically daily as needed (eczema dermatitis). (Patient not taking: Reported on 01/24/12/2440    folic acid (FOLVITE) 1 MG tablet Take 1 tablet (1 mg total) by mouth daily. (Patient not taking: Reported on 02/07/2022) 30 tablet 0   metoprolol succinate (TOPROL-XL) 25 MG 24 hr tablet Take 0.5 tablets (12.5 mg total) by mouth daily. (Patient not taking: Reported on 02/07/2022) 30 tablet 0   NUCYNTA 50 MG tablet Take 50 mg by mouth 2 (two) times daily.     vitamin B-12 (CYANOCOBALAMIN) 500 MCG tablet  Take 500 mcg by mouth daily. (Patient not taking: Reported on 02/07/2022)     No current facility-administered medications for this visit.    Allergies:   Buprenorphine hcl, Codeine, Morphine, Morphine and related, Tramadol, Adhesive [tape], Doxycycline hyclate, Hydrocodone-acetaminophen, Other, Oxycodone hcl, Tapentadol, Tapentadol hcl, Tramadol hcl, Hydrocodone-acetaminophen, Lactose intolerance (gi), and Oxycodone    ROS:  Please see the history of present illness.   Otherwise, review of systems are positive for none.   All other systems are reviewed and negative.    PHYSICAL EXAM:  VS:  BP 101/65   Pulse 69   Wt 116 lb (52.6 kg)   BMI 19.30 kg/m  , BMI Body mass index is 19.3 kg/m. GENERAL:  Well appearing NECK:  No jugular venous distention, waveform within normal limits, carotid upstroke brisk and symmetric, no bruits, no thyromegaly LUNGS:  Clear to auscultation bilaterally CHEST:  Unremarkable HEART:  PMI not displaced or sustained,S1 and S2 within normal limits, no S3, no S4, no clicks, no rubs, 2 out of 6 apical systolic murmur nonradiating and early peaking, no diastolic murmurs ABD:  Flat, positive bowel sounds normal in frequency in pitch, no bruits, no rebound, no guarding, no midline pulsatile mass, no hepatomegaly, no splenomegaly EXT:  2 plus pulses throughout, no edema, no cyanosis no clubbing   EKG:  EKG is not ordered today.   Recent Labs: 08/10/2021: TSH 1.473 09/11/2021: ALT 7 09/12/2021: Magnesium 1.8 12/19/2021: BUN 21; Creatinine, Ser 0.80; Hemoglobin 11.9; Platelets 216; Potassium 3.9; Sodium 141    Lipid Panel    Component Value Date/Time   CHOL 98 08/15/2021 1111   TRIG 136 08/15/2021 1111   HDL 29 (L) 08/15/2021 1111   CHOLHDL 3.4 08/15/2021 1111   VLDL 27 08/15/2021 1111   LDLCALC 42 08/15/2021 1111      Wt Readings from Last 3 Encounters:  02/07/22 116 lb (52.6 kg)  12/20/21 115 lb 15.4 oz (52.6 kg)  11/21/21 117 lb 3.2 oz (53.2 kg)       Other studies Reviewed: Additional studies/ records that were reviewed today include:   Labs from earlier this year. . Review of the above records demonstrates:  Please see elsewhere in the note.     ASSESSMENT AND PLAN:  NSVT: She is not feeling any palpitations.  Despite the low blood pressure.  Continue low-dose of beta-blocker.  She did have nonsustained ventricular tachycardia in the hospital.  Hypertension: Blood pressure is actually running low.  She is not having any presyncope or syncope.  No change in therapy.  Murmur: The patient had some septal hypertrophy with some moderate to mild dynamic obstruction and some moderate to mild aortic stenosis.  She is not having any symptoms.  I will follow this clinically.  Current medicines are reviewed at length with the patient today.  The patient does not have concerns regarding medicines.  The following changes have been made: None  Labs/ tests ordered today include:  .  None  No orders of the defined types were placed in this encounter.    Disposition:   FU with me in 6 months   Signed, Minus Breeding, MD  02/07/2022 2:37 PM    [place]

## 2022-02-06 DIAGNOSIS — M6281 Muscle weakness (generalized): Secondary | ICD-10-CM | POA: Diagnosis not present

## 2022-02-07 ENCOUNTER — Ambulatory Visit: Payer: Medicare Other | Attending: Cardiology | Admitting: Cardiology

## 2022-02-07 ENCOUNTER — Encounter: Payer: Self-pay | Admitting: Cardiology

## 2022-02-07 VITALS — BP 101/65 | HR 69 | Wt 116.0 lb

## 2022-02-07 DIAGNOSIS — I10 Essential (primary) hypertension: Secondary | ICD-10-CM | POA: Diagnosis not present

## 2022-02-07 DIAGNOSIS — I4729 Other ventricular tachycardia: Secondary | ICD-10-CM

## 2022-02-07 NOTE — Patient Instructions (Signed)
Medication Instructions:  Your physician recommends that you continue on your current medications as directed. Please refer to the Current Medication list given to you today.  *If you need a refill on your cardiac medications before your next appointment, please call your pharmacy*  Follow-Up: At [facility], you and your health needs are our priority.  As part of our continuing mission to provide you with exceptional heart care, we have created designated Provider Care Teams.  These Care Teams include your primary Cardiologist (physician) and Advanced Practice Providers (APPs -  Physician Assistants and Nurse Practitioners) who all work together to provide you with the care you need, when you need it.  We recommend signing up for the patient portal called "MyChart".  Sign up information is provided on this After Visit Summary.  MyChart is used to connect with patients for Virtual Visits (Telemedicine).  Patients are able to view lab/test results, encounter notes, upcoming appointments, etc.  Non-urgent messages can be sent to your provider as well.   To learn more about what you can do with MyChart, go to [email or web address].    Your next appointment:   6 month(s)  The format for your next appointment:   In Person  Provider:   James Hochrein, MD         

## 2022-02-08 DIAGNOSIS — M6281 Muscle weakness (generalized): Secondary | ICD-10-CM | POA: Diagnosis not present

## 2022-02-13 DIAGNOSIS — M6281 Muscle weakness (generalized): Secondary | ICD-10-CM | POA: Diagnosis not present

## 2022-02-15 DIAGNOSIS — M6281 Muscle weakness (generalized): Secondary | ICD-10-CM | POA: Diagnosis not present

## 2022-02-20 DIAGNOSIS — M6281 Muscle weakness (generalized): Secondary | ICD-10-CM | POA: Diagnosis not present

## 2022-02-22 DIAGNOSIS — M6281 Muscle weakness (generalized): Secondary | ICD-10-CM | POA: Diagnosis not present

## 2022-02-27 DIAGNOSIS — I10 Essential (primary) hypertension: Secondary | ICD-10-CM | POA: Diagnosis not present

## 2022-02-27 DIAGNOSIS — E039 Hypothyroidism, unspecified: Secondary | ICD-10-CM | POA: Diagnosis not present

## 2022-02-27 DIAGNOSIS — I2510 Atherosclerotic heart disease of native coronary artery without angina pectoris: Secondary | ICD-10-CM | POA: Diagnosis not present

## 2022-02-27 DIAGNOSIS — Z23 Encounter for immunization: Secondary | ICD-10-CM | POA: Diagnosis not present

## 2022-02-27 DIAGNOSIS — M6281 Muscle weakness (generalized): Secondary | ICD-10-CM | POA: Diagnosis not present

## 2022-03-01 ENCOUNTER — Other Ambulatory Visit: Payer: Self-pay | Admitting: Physician Assistant

## 2022-03-01 DIAGNOSIS — M810 Age-related osteoporosis without current pathological fracture: Secondary | ICD-10-CM

## 2022-03-07 DIAGNOSIS — I10 Essential (primary) hypertension: Secondary | ICD-10-CM | POA: Diagnosis not present

## 2022-03-07 DIAGNOSIS — M48061 Spinal stenosis, lumbar region without neurogenic claudication: Secondary | ICD-10-CM | POA: Diagnosis not present

## 2022-03-07 DIAGNOSIS — H00012 Hordeolum externum right lower eyelid: Secondary | ICD-10-CM | POA: Diagnosis not present

## 2022-03-08 DIAGNOSIS — M6281 Muscle weakness (generalized): Secondary | ICD-10-CM | POA: Diagnosis not present

## 2022-03-13 DIAGNOSIS — M6281 Muscle weakness (generalized): Secondary | ICD-10-CM | POA: Diagnosis not present

## 2022-03-15 DIAGNOSIS — M6281 Muscle weakness (generalized): Secondary | ICD-10-CM | POA: Diagnosis not present

## 2022-03-20 DIAGNOSIS — B351 Tinea unguium: Secondary | ICD-10-CM | POA: Diagnosis not present

## 2022-03-20 DIAGNOSIS — M2041 Other hammer toe(s) (acquired), right foot: Secondary | ICD-10-CM | POA: Diagnosis not present

## 2022-03-20 DIAGNOSIS — M6281 Muscle weakness (generalized): Secondary | ICD-10-CM | POA: Diagnosis not present

## 2022-03-24 ENCOUNTER — Ambulatory Visit: Payer: Medicare Other | Admitting: Cardiology

## 2022-03-24 DIAGNOSIS — M6281 Muscle weakness (generalized): Secondary | ICD-10-CM | POA: Diagnosis not present

## 2022-03-27 DIAGNOSIS — M6281 Muscle weakness (generalized): Secondary | ICD-10-CM | POA: Diagnosis not present

## 2022-03-30 ENCOUNTER — Other Ambulatory Visit: Payer: Self-pay | Admitting: Physician Assistant

## 2022-03-30 DIAGNOSIS — Z1231 Encounter for screening mammogram for malignant neoplasm of breast: Secondary | ICD-10-CM

## 2022-04-03 DIAGNOSIS — M6281 Muscle weakness (generalized): Secondary | ICD-10-CM | POA: Diagnosis not present

## 2022-04-05 DIAGNOSIS — H0102A Squamous blepharitis right eye, upper and lower eyelids: Secondary | ICD-10-CM | POA: Diagnosis not present

## 2022-04-05 DIAGNOSIS — H16121 Filamentary keratitis, right eye: Secondary | ICD-10-CM | POA: Diagnosis not present

## 2022-04-05 DIAGNOSIS — H10823 Rosacea conjunctivitis, bilateral: Secondary | ICD-10-CM | POA: Diagnosis not present

## 2022-04-05 DIAGNOSIS — H0102B Squamous blepharitis left eye, upper and lower eyelids: Secondary | ICD-10-CM | POA: Diagnosis not present

## 2022-04-07 DIAGNOSIS — M6281 Muscle weakness (generalized): Secondary | ICD-10-CM | POA: Diagnosis not present

## 2022-04-10 DIAGNOSIS — M6281 Muscle weakness (generalized): Secondary | ICD-10-CM | POA: Diagnosis not present

## 2022-04-11 DIAGNOSIS — Z96651 Presence of right artificial knee joint: Secondary | ICD-10-CM | POA: Diagnosis not present

## 2022-04-11 DIAGNOSIS — M961 Postlaminectomy syndrome, not elsewhere classified: Secondary | ICD-10-CM | POA: Diagnosis not present

## 2022-04-11 DIAGNOSIS — M5136 Other intervertebral disc degeneration, lumbar region: Secondary | ICD-10-CM | POA: Diagnosis not present

## 2022-04-11 DIAGNOSIS — Z96642 Presence of left artificial hip joint: Secondary | ICD-10-CM | POA: Diagnosis not present

## 2022-04-12 DIAGNOSIS — M6281 Muscle weakness (generalized): Secondary | ICD-10-CM | POA: Diagnosis not present

## 2022-04-17 DIAGNOSIS — M6281 Muscle weakness (generalized): Secondary | ICD-10-CM | POA: Diagnosis not present

## 2022-04-19 DIAGNOSIS — M6281 Muscle weakness (generalized): Secondary | ICD-10-CM | POA: Diagnosis not present

## 2022-04-24 DIAGNOSIS — M6281 Muscle weakness (generalized): Secondary | ICD-10-CM | POA: Diagnosis not present

## 2022-04-26 DIAGNOSIS — M6281 Muscle weakness (generalized): Secondary | ICD-10-CM | POA: Diagnosis not present

## 2022-05-01 DIAGNOSIS — M6281 Muscle weakness (generalized): Secondary | ICD-10-CM | POA: Diagnosis not present

## 2022-05-03 DIAGNOSIS — M6281 Muscle weakness (generalized): Secondary | ICD-10-CM | POA: Diagnosis not present

## 2022-05-08 DIAGNOSIS — M6281 Muscle weakness (generalized): Secondary | ICD-10-CM | POA: Diagnosis not present

## 2022-05-10 DIAGNOSIS — M6281 Muscle weakness (generalized): Secondary | ICD-10-CM | POA: Diagnosis not present

## 2022-05-23 DIAGNOSIS — D485 Neoplasm of uncertain behavior of skin: Secondary | ICD-10-CM | POA: Diagnosis not present

## 2022-05-23 DIAGNOSIS — C44529 Squamous cell carcinoma of skin of other part of trunk: Secondary | ICD-10-CM | POA: Diagnosis not present

## 2022-05-24 DIAGNOSIS — M6281 Muscle weakness (generalized): Secondary | ICD-10-CM | POA: Diagnosis not present

## 2022-05-26 DIAGNOSIS — M6281 Muscle weakness (generalized): Secondary | ICD-10-CM | POA: Diagnosis not present

## 2022-05-29 DIAGNOSIS — M6281 Muscle weakness (generalized): Secondary | ICD-10-CM | POA: Diagnosis not present

## 2022-05-31 DIAGNOSIS — M6281 Muscle weakness (generalized): Secondary | ICD-10-CM | POA: Diagnosis not present

## 2022-06-02 ENCOUNTER — Ambulatory Visit
Admission: RE | Admit: 2022-06-02 | Discharge: 2022-06-02 | Disposition: A | Discharge status: Home / Self Care | Payer: Self-pay | Source: Ambulatory Visit | Attending: Physician Assistant | Admitting: Physician Assistant

## 2022-06-02 DIAGNOSIS — Z1231 Encounter for screening mammogram for malignant neoplasm of breast: Secondary | ICD-10-CM | POA: Diagnosis not present

## 2022-06-05 DIAGNOSIS — M6281 Muscle weakness (generalized): Secondary | ICD-10-CM | POA: Diagnosis not present

## 2022-06-09 DIAGNOSIS — M6281 Muscle weakness (generalized): Secondary | ICD-10-CM | POA: Diagnosis not present

## 2022-06-12 DIAGNOSIS — M6281 Muscle weakness (generalized): Secondary | ICD-10-CM | POA: Diagnosis not present

## 2022-06-16 DIAGNOSIS — M6281 Muscle weakness (generalized): Secondary | ICD-10-CM | POA: Diagnosis not present

## 2022-06-19 DIAGNOSIS — M6281 Muscle weakness (generalized): Secondary | ICD-10-CM | POA: Diagnosis not present

## 2022-06-23 ENCOUNTER — Telehealth: Payer: Self-pay | Admitting: Cardiology

## 2022-06-23 DIAGNOSIS — M6281 Muscle weakness (generalized): Secondary | ICD-10-CM | POA: Diagnosis not present

## 2022-06-23 NOTE — Telephone Encounter (Signed)
Spoke with patient of Dr. Percival Spanish. She reports low BP yesterday and today. She had not been previously monitoring her BP as she moved and is now in a facility where she had limited access to her BP cuff. She is only on metoprolol succinate 12.'5mg'$  daily - h/o of NSVT but does not notice palpitations.   She reports she is at [facility] and diet is limited and varied on how much she consumes. She reports her fluid intake may be limited as the water at this facility does not taste good to her. She reports she had been using a salt pill up until last week and stopped d/t bruising. She reports Dr. Maureen Ralphs prescribed this for her. She could not locate what this was.   Advised will send to Dr. Henrietta Dine to advise on low BP

## 2022-06-23 NOTE — Telephone Encounter (Signed)
Minus Breeding, MD  You17 minutes ago (1:10 PM)    OK to hold the metoprolol.

## 2022-06-23 NOTE — Telephone Encounter (Signed)
Pt c/o BP issue: STAT if pt c/o blurred vision, one-sided weakness or slurred speech  1. What are your last 5 BP readings? Yesterday 86/60,  this morning before medication 137/70's, after medication 89/62, HR has been in 80's  2. Are you having any other symptoms (ex. Dizziness, headache, blurred vision, passed out)? Dizzy yesterday "head felt funny", no other symptoms  3. What is your BP issue? Patient states she has not been taking her BP , because since moving she put her BP cuff away. She says yesterday she noticed her "head felt funny" so she checked it and her BP was very low. She says she ate some potato chips and it went up to 112/60's. She says she is not sure if she needs to take a half a pill of her BP medication or if she needs to stop it.

## 2022-06-23 NOTE — Telephone Encounter (Signed)
Left message to call back  

## 2022-06-26 NOTE — Telephone Encounter (Signed)
Updated BP readings:   Saturday 06/24/22 151/84 76 - 8AM 115/74 94 - after she ate and medication  Sunday 06/25/22 157/87 70 - 8AM 130/78 56 - after she ate and medication  Monday 06/26/22 150/97 73 - 8AM 125/69 97 - after she ate and medication  Patient wanted to give updated BP readings from over the weekend.

## 2022-06-27 NOTE — Telephone Encounter (Signed)
Left message for patient, No change in medications at this time.

## 2022-06-28 DIAGNOSIS — D045 Carcinoma in situ of skin of trunk: Secondary | ICD-10-CM | POA: Diagnosis not present

## 2022-06-29 DIAGNOSIS — M6281 Muscle weakness (generalized): Secondary | ICD-10-CM | POA: Diagnosis not present

## 2022-06-30 DIAGNOSIS — M6281 Muscle weakness (generalized): Secondary | ICD-10-CM | POA: Diagnosis not present

## 2022-07-04 ENCOUNTER — Telehealth: Payer: Self-pay | Admitting: Cardiology

## 2022-07-04 NOTE — Telephone Encounter (Signed)
Pt stated her BP is fluctuating and is concerned. She is very upset with her new lifestyle living at [facility]. She stated she was always active and now she has nothing to do. She is taking medications as prescribed. Please advise on BP.

## 2022-07-04 NOTE — Telephone Encounter (Signed)
Patient calling with BP readings:  Last week Sat am 151/84/76, 115/74/94 after meds Sun 157/87/70, 130/78/66 Mon 150/97/73, 125/69/97 Tues 155/84/79, 131/74/71 Wed 142/81/77, 128/76/75 Thurs 149/93/77, 121/77/88 Fri 149/90/84, 129/115/95 Sat 118/78/87, 101/68/80 This week Sun 172/104/86, 106/68/99 Mon 151/90/71 Today Tues 123/80/86, 92/60/93  Patient states her BP used to be consistent, now it is going up and down.

## 2022-07-05 DIAGNOSIS — R339 Retention of urine, unspecified: Secondary | ICD-10-CM | POA: Diagnosis not present

## 2022-07-05 DIAGNOSIS — M6281 Muscle weakness (generalized): Secondary | ICD-10-CM | POA: Diagnosis not present

## 2022-07-05 DIAGNOSIS — N319 Neuromuscular dysfunction of bladder, unspecified: Secondary | ICD-10-CM | POA: Diagnosis not present

## 2022-07-05 DIAGNOSIS — N2889 Other specified disorders of kidney and ureter: Secondary | ICD-10-CM | POA: Diagnosis not present

## 2022-07-05 DIAGNOSIS — N390 Urinary tract infection, site not specified: Secondary | ICD-10-CM | POA: Diagnosis not present

## 2022-07-06 NOTE — Telephone Encounter (Signed)
Spoke with patient and gave her Dr. Rosezella [place] reply: "These BPs fluctuate a little but she is sensitive to meds .  I would not suggest med titration.  Can she get rides to the [facility] for [employer]." She stated she does not agree with that and wants to make an appointment with Dr. Percival Spanish. She will call clinic after she walks her dog to schedule.

## 2022-07-06 NOTE — Telephone Encounter (Signed)
Follow up scheduled

## 2022-07-11 DIAGNOSIS — M2041 Other hammer toe(s) (acquired), right foot: Secondary | ICD-10-CM | POA: Diagnosis not present

## 2022-07-11 DIAGNOSIS — B351 Tinea unguium: Secondary | ICD-10-CM | POA: Diagnosis not present

## 2022-07-12 DIAGNOSIS — M6281 Muscle weakness (generalized): Secondary | ICD-10-CM | POA: Diagnosis not present

## 2022-07-16 NOTE — Progress Notes (Unsigned)
Cardiology Office Note   Date:  07/17/2022   ID:  Justyn Leupold, DOB 05-05-1930, MRN [id number]  PCP:  Lavone Orn, MD  Cardiologist:   Minus Breeding, MD   Chief Complaint  Patient presents with   Palpitations     History of Present Illness: Kerri [name] is a 89 y.o. female who presents for ongoing assessment and management of hypertension, palpitations, with other history to include cluster headaches.  She is living in a retirement home and she has called many times with fluctuating BPs.    She gets around with her walker and her cane.  She walks her dog 4 times a day using her walker.  The patient denies any new symptoms such as chest discomfort, neck or arm discomfort. There has been no new shortness of breath, PND or orthopnea. There have been no reported  presyncope or syncope.  She is concerned because her blood pressure is going up.  She has had rare palpitations.    Past Medical History:  Diagnosis Date   Adenomatous colon polyp    Asthma    Basal cell carcinoma 03/13/2017   left forehead - CX3 + cautery + 5FU   CAD (coronary artery disease)    Chest CTA 10/07: 40% or less pLAD;  ETT 8/09: negative   Carotid bruit    Chronic back pain    Depression    Diverticulosis    DJD (degenerative joint disease)    Dyslipidemia    Dysrhythmia    pvcs   Fatty liver    GERD (gastroesophageal reflux disease)    HTN (hypertension)    Hypothyroidism    IBS (irritable bowel syndrome)    Internal hemorrhoid    Melanoma ([place])    metastatic; left leg; s/p multiple excisions   Migraine    Osteoporosis    Spinal stenosis    Squamous cell carcinoma of skin 09/02/2015   mid chest - CX3 + 5FU   Squamous cell carcinoma of skin 11/07/2017   left wrist - tx p bx   Squamous cell carcinoma of skin 12/11/2017   left hand - tx p bx    Past Surgical History:  Procedure Laterality Date   BACK SURGERY     BLEPHAROPLASTY     BUNIONECTOMY WITH HAMMERTOE RECONSTRUCTION Right  01/16/2013   Procedure: RIGHT Barbie Banner OSTEOTOMY WITH SIMPLE BUNIONECTOMY AND SECOND HAMMER TOE CORRECTION;  Surgeon: Wylene Simmer, MD;  Location: [place];  Service: Orthopedics;  Laterality: Right;   CARPAL TUNNEL RELEASE     FEMUR IM NAIL Right 08/10/2021   Procedure: INTRAMEDULLARY (IM) NAIL FEMORAL;  Surgeon: Rod Can, MD;  Location: [facility];  Service: Orthopedics;  Laterality: Right;   KNEE ARTHROSCOPY Right    LUMBAR LAMINECTOMY/DECOMPRESSION MICRODISCECTOMY N/A 11/13/2013   Procedure: LUMBAR TWO-THREE,LUMBAR THREE-FOUR,LUMBAR FOUR-FIVE LAMINECTOMY AND FORAMINOTOMY;  Surgeon: Ophelia Charter, MD;  Location: [place] [facility];  Service: Neurosurgery;  Laterality: N/A;   MELANOMA EXCISION Left 09/19/2012   Procedure: WIDE EXCISION METASTATIC MELANOMA LEFT MEDIAL THIGH;  Surgeon: Zenovia Jarred, MD;  Location: [place];  Service: General;  Laterality: Left;   ROTATOR CUFF REPAIR     SHOULDER SURGERY     TOE SURGERY     TOTAL HIP ARTHROPLASTY  09/04/2011   Procedure: TOTAL HIP ARTHROPLASTY;  Surgeon: Gearlean Alf, MD;  Location: [facility];  Service: Orthopedics;  Laterality: Left;   TOTAL HIP REVISION Left 11/27/2018   Procedure:  Left hip femoral revision;  Surgeon: Gaynelle Arabian, MD;  Location: [facility];  Service: Orthopedics;  Laterality: Left;  175mn   TOTAL KNEE ARTHROPLASTY Right 10/06/2019   Procedure: TOTAL KNEE ARTHROPLASTY;  Surgeon: AGaynelle Arabian MD;  Location: [facility];  Service: Orthopedics;  Laterality: Right;  5104m     Current Outpatient Medications  Medication Sig Dispense Refill   alendronate (FOSAMAX) 70 MG tablet Take 70 mg by mouth every Sunday.     Biotin 5 MG CAPS Take 5 mg by mouth daily.     calcium carbonate (OS-CAL) 1250 (500 Ca) MG chewable tablet Chew 2 tablets by mouth daily.     Cholecalciferol (VITAMIN D3) 50 MCG (2000 UT) TABS Take 2,000 Units by mouth daily.     clobetasol (OLUX) 0.05 % topical foam Apply topically 2 (two)  times daily. 50 g 4   collagenase (SANTYL) 250 UNIT/GM ointment Apply topically daily. Apply to sacral Unstageable Pressure injury in a 1/8 inch layer once daily and PRN soiling of dressing indivating replacement 15 g 0   estradiol (ESTRACE) 0.1 MG/GM vaginal cream Place 1 Applicatorful vaginally every other day.     ferrous sulfate 325 (65 FE) MG tablet Take 1 tablet (325 mg total) by mouth daily with breakfast.  3   Flurandrenolide (CORDRAN) 4 MCG/SQCM TAPE Apply 1 each topically daily as needed (eczema dermatitis).     fluticasone (FLONASE) 50 MCG/ACT nasal spray Place 2 sprays into both nostrils every morning.      folic acid (FOLVITE) 1 MG tablet Take 1 tablet (1 mg total) by mouth daily. 30 tablet 0   gabapentin (NEURONTIN) 100 MG capsule Take 100 mg by mouth 3 (three) times daily.     levothyroxine (SYNTHROID, LEVOTHROID) 75 MCG tablet Take 75 mcg by mouth daily with breakfast.     loratadine (CLARITIN) 10 MG tablet Take 10 mg by mouth daily.     metoprolol succinate (TOPROL-XL) 25 MG 24 hr tablet Take 0.5 tablets (12.5 mg total) by mouth daily. 30 tablet 0   NUCYNTA 50 MG tablet Take 50 mg by mouth 2 (two) times daily.     rosuvastatin (CRESTOR) 10 MG tablet Take 10 mg by mouth at bedtime.     trimethoprim (TRIMPEX) 100 MG tablet Take 100 mg by mouth daily.     vitamin B-12 (CYANOCOBALAMIN) 500 MCG tablet Take 500 mcg by mouth daily.     No current facility-administered medications for this visit.    Allergies:   Buprenorphine hcl, Codeine, Morphine, Morphine and related, Tramadol, Adhesive [tape], Doxycycline hyclate, Hydrocodone-acetaminophen, Other, Oxycodone hcl, Tapentadol, Tapentadol hcl, Tramadol hcl, Hydrocodone-acetaminophen, Lactose intolerance (gi), and Oxycodone    ROS:  Please see the history of present illness.   Otherwise, review of systems are positive for none.   All other systems are reviewed and negative.    PHYSICAL EXAM: VS:  BP 134/72   Pulse 78   Ht '5\' 5"'$   (1.651 m)   Wt 126 lb (57.2 kg)   SpO2 98%   BMI 20.97 kg/m  , BMI Body mass index is 20.97 kg/m. GENERAL:  Well appearing NECK:  No jugular venous distention, waveform within normal limits, carotid upstroke brisk and symmetric, no bruits, no thyromegaly LUNGS:  Clear to auscultation bilaterally CHEST:  Unremarkable HEART:  PMI not displaced or sustained,S1 and S2 within normal limits, no S3, no S4, no clicks, no rubs, 2 out of 6 apical systolic murmur radiating at the aortic outflow tract, no diastolic  murmurs ABD:  Flat, positive bowel sounds normal in frequency in pitch, no bruits, no rebound, no guarding, no midline pulsatile mass, no hepatomegaly, no splenomegaly EXT:  2 plus pulses throughout, no edema, no cyanosis no clubbing   EKG:  EKG is ordered today. Sinus rhythm, rate 63, axis within normal limits, intervals within normal limits, no acute ST-T wave changes.  Recent Labs: 08/10/2021: TSH 1.473 09/11/2021: ALT 7 09/12/2021: Magnesium 1.8 12/19/2021: BUN 21; Creatinine, Ser 0.80; Hemoglobin 11.9; Platelets 216; Potassium 3.9; Sodium 141    Lipid Panel    Component Value Date/Time   CHOL 98 08/15/2021 1111   TRIG 136 08/15/2021 1111   HDL 29 (L) 08/15/2021 1111   CHOLHDL 3.4 08/15/2021 1111   VLDL 27 08/15/2021 1111   LDLCALC 42 08/15/2021 1111      Wt Readings from Last 3 Encounters:  07/17/22 126 lb (57.2 kg)  02/07/22 116 lb (52.6 kg)  12/20/21 115 lb 15.4 oz (52.6 kg)      Other studies Reviewed: Additional studies/ records that were reviewed today include:  Labs Review of the above records demonstrates:  Please see elsewhere in the note.     ASSESSMENT AND PLAN:  NSVT:   She is not having any new palpitations.  No change in therapy.  Hypertension: Blood pressure is fluctuating.  She is going to send me her blood pressure diary.  For now no change in therapy.   Murmur:   She had some septal hypertrophy with some moderate to mild dynamic obstruction and  moderate to mild aortic stenosis.  I would manage this medically she is having no symptoms.    Current medicines are reviewed at length with the patient today.  The patient does not have concerns regarding medicines.  The following changes have been made: None  Labs/ tests ordered today include:  .  None  Orders Placed This Encounter  Procedures   EKG 12-Lead     Disposition:   FU with APP in six months   Signed, Minus Breeding, MD  07/17/2022 11:09 AM    [facility]

## 2022-07-17 ENCOUNTER — Ambulatory Visit: Payer: Medicare Other | Attending: Cardiology | Admitting: Cardiology

## 2022-07-17 ENCOUNTER — Encounter: Payer: Self-pay | Admitting: Cardiology

## 2022-07-17 VITALS — BP 134/72 | HR 78 | Ht 65.0 in | Wt 126.0 lb

## 2022-07-17 DIAGNOSIS — I10 Essential (primary) hypertension: Secondary | ICD-10-CM | POA: Diagnosis not present

## 2022-07-17 DIAGNOSIS — I4729 Other ventricular tachycardia: Secondary | ICD-10-CM | POA: Diagnosis not present

## 2022-07-17 DIAGNOSIS — M6281 Muscle weakness (generalized): Secondary | ICD-10-CM | POA: Diagnosis not present

## 2022-07-17 NOTE — Patient Instructions (Addendum)
Medication Instructions:   Your physician recommends that you continue on your current medications as directed. Please refer to the Current Medication list given to you today.  *If you need a refill on your cardiac medications before your next appointment, please call your pharmacy*  Lab Work: NONE ordered at this time of appointment   If you have labs (blood work) drawn today and your tests are completely normal, you will receive your results only by: [place] (if you have MyChart) OR A paper copy in the mail If you have any lab test that is abnormal or we need to change your treatment, we will call you to review the results.  Testing/Procedures: NONE ordered at this time of appointment   Follow-Up: At [facility], you and your health needs are our priority.  As part of our continuing mission to provide you with exceptional heart care, we have created designated Provider Care Teams.  These Care Teams include your primary Cardiologist (physician) and Advanced Practice Providers (APPs -  Physician Assistants and Nurse Practitioners) who all work together to provide you with the care you need, when you need it.  We recommend signing up for the patient portal called "MyChart".  Sign up information is provided on this After Visit Summary.  MyChart is used to connect with patients for Virtual Visits (Telemedicine).  Patients are able to view lab/test results, encounter notes, upcoming appointments, etc.  Non-urgent messages can be sent to your provider as well.   To learn more about what you can do with MyChart, go to [email or web address].    Your next appointment:   6 month(s)  Provider:   APP

## 2022-07-31 DIAGNOSIS — H00011 Hordeolum externum right upper eyelid: Secondary | ICD-10-CM | POA: Diagnosis not present

## 2022-08-01 DIAGNOSIS — M6281 Muscle weakness (generalized): Secondary | ICD-10-CM | POA: Diagnosis not present

## 2022-08-07 DIAGNOSIS — H10823 Rosacea conjunctivitis, bilateral: Secondary | ICD-10-CM | POA: Diagnosis not present

## 2022-08-07 DIAGNOSIS — H0102B Squamous blepharitis left eye, upper and lower eyelids: Secondary | ICD-10-CM | POA: Diagnosis not present

## 2022-08-07 DIAGNOSIS — H0102A Squamous blepharitis right eye, upper and lower eyelids: Secondary | ICD-10-CM | POA: Diagnosis not present

## 2022-08-07 DIAGNOSIS — H00011 Hordeolum externum right upper eyelid: Secondary | ICD-10-CM | POA: Diagnosis not present

## 2022-08-08 ENCOUNTER — Ambulatory Visit: Payer: Medicare Other | Admitting: Cardiology

## 2022-08-10 DIAGNOSIS — M961 Postlaminectomy syndrome, not elsewhere classified: Secondary | ICD-10-CM | POA: Diagnosis not present

## 2022-08-10 DIAGNOSIS — M5136 Other intervertebral disc degeneration, lumbar region: Secondary | ICD-10-CM | POA: Diagnosis not present

## 2022-08-10 DIAGNOSIS — M5416 Radiculopathy, lumbar region: Secondary | ICD-10-CM | POA: Diagnosis not present

## 2022-08-10 DIAGNOSIS — M1711 Unilateral primary osteoarthritis, right knee: Secondary | ICD-10-CM | POA: Diagnosis not present

## 2022-08-31 DIAGNOSIS — Z0000 Encounter for general adult medical examination without abnormal findings: Secondary | ICD-10-CM | POA: Diagnosis not present

## 2022-08-31 DIAGNOSIS — K589 Irritable bowel syndrome without diarrhea: Secondary | ICD-10-CM | POA: Diagnosis not present

## 2022-08-31 DIAGNOSIS — I10 Essential (primary) hypertension: Secondary | ICD-10-CM | POA: Diagnosis not present

## 2022-08-31 DIAGNOSIS — D649 Anemia, unspecified: Secondary | ICD-10-CM | POA: Diagnosis not present

## 2022-08-31 DIAGNOSIS — M810 Age-related osteoporosis without current pathological fracture: Secondary | ICD-10-CM | POA: Diagnosis not present

## 2022-08-31 DIAGNOSIS — E039 Hypothyroidism, unspecified: Secondary | ICD-10-CM | POA: Diagnosis not present

## 2022-08-31 DIAGNOSIS — E782 Mixed hyperlipidemia: Secondary | ICD-10-CM | POA: Diagnosis not present

## 2022-08-31 DIAGNOSIS — M6281 Muscle weakness (generalized): Secondary | ICD-10-CM | POA: Diagnosis not present

## 2022-08-31 DIAGNOSIS — J45909 Unspecified asthma, uncomplicated: Secondary | ICD-10-CM | POA: Diagnosis not present

## 2022-09-01 DIAGNOSIS — M6281 Muscle weakness (generalized): Secondary | ICD-10-CM | POA: Diagnosis not present

## 2022-09-07 DIAGNOSIS — L538 Other specified erythematous conditions: Secondary | ICD-10-CM | POA: Diagnosis not present

## 2022-09-07 DIAGNOSIS — Z789 Other specified health status: Secondary | ICD-10-CM | POA: Diagnosis not present

## 2022-09-07 DIAGNOSIS — L298 Other pruritus: Secondary | ICD-10-CM | POA: Diagnosis not present

## 2022-09-07 DIAGNOSIS — L820 Inflamed seborrheic keratosis: Secondary | ICD-10-CM | POA: Diagnosis not present

## 2022-09-07 DIAGNOSIS — L910 Hypertrophic scar: Secondary | ICD-10-CM | POA: Diagnosis not present

## 2022-09-07 DIAGNOSIS — Z08 Encounter for follow-up examination after completed treatment for malignant neoplasm: Secondary | ICD-10-CM | POA: Diagnosis not present

## 2022-09-07 DIAGNOSIS — Z85828 Personal history of other malignant neoplasm of skin: Secondary | ICD-10-CM | POA: Diagnosis not present

## 2022-09-14 DIAGNOSIS — M6281 Muscle weakness (generalized): Secondary | ICD-10-CM | POA: Diagnosis not present

## 2022-09-19 ENCOUNTER — Telehealth: Payer: Self-pay | Admitting: Cardiology

## 2022-09-19 NOTE — Telephone Encounter (Signed)
Patient states her BP is "acting up". Discussed always low and in activities and now it is high. She complains of being old and having problems and doesn't know why she is even here. She goes on for a while and then reads BP reading for days.  Did not list all as they were average of these 122/76   76 167/136 79  115/70 84 Later in afternoon 115/78   84 119/81  79   119/81  79 129/76 77 131/79 88 131/76 90    Later in afternoon 88/63  90 168/87  73 150/89 81 139/85 83 155/84    After mediation  90/74 149/93 121/74 102/69 She states "I don't like it.  I believe Doctors are tired of old people, I am tired of old people and I am tired of being around. It is ridiculous.  Something needs to be done.I don't want to hear that everything looks good, I want something done.  When the dog goes I want to go." I am more concerned with her not wanting to be alive than her BP.  Multiple statements of everyone is gone and she is old and why is she even here.  Expressed concern that we want her to be here and take care of her and worried that she might hurt herself in some way.  She immediatly states she would never hurt herself she states "I promise I would never do that". Once I spoke of this she did not want to talk anymore and ended the call

## 2022-09-19 NOTE — Telephone Encounter (Signed)
Patient is aware of provider message. She was not happy and states "he always says that." I did advise blood pressure does change throughout the day. She was still upset and stated everything is fine and hung up the telephone.

## 2022-09-19 NOTE — Telephone Encounter (Signed)
Pt c/o BP issue: STAT if pt c/o blurred vision, one-sided weakness or slurred speech  1. What are your last 5 BP readings? 131/76  2. Are you having any other symptoms (ex. Dizziness, headache, blurred vision, passed out)? A little lightheaded.   3. What is your BP issue? Pt is concerned about BP changing often. Please advise

## 2022-09-22 DIAGNOSIS — M6281 Muscle weakness (generalized): Secondary | ICD-10-CM | POA: Diagnosis not present

## 2022-10-03 ENCOUNTER — Ambulatory Visit
Admission: RE | Admit: 2022-10-03 | Discharge: 2022-10-03 | Disposition: A | Discharge status: Home / Self Care | Payer: Medicare Other | Source: Ambulatory Visit | Attending: Physician Assistant | Admitting: Physician Assistant

## 2022-10-03 ENCOUNTER — Other Ambulatory Visit: Payer: Self-pay | Admitting: Physician Assistant

## 2022-10-03 DIAGNOSIS — D649 Anemia, unspecified: Secondary | ICD-10-CM | POA: Diagnosis not present

## 2022-10-03 DIAGNOSIS — R10814 Left lower quadrant abdominal tenderness: Secondary | ICD-10-CM

## 2022-10-03 DIAGNOSIS — K5909 Other constipation: Secondary | ICD-10-CM

## 2022-10-05 DIAGNOSIS — M6281 Muscle weakness (generalized): Secondary | ICD-10-CM | POA: Diagnosis not present

## 2022-10-06 ENCOUNTER — Ambulatory Visit
Admission: RE | Admit: 2022-10-06 | Discharge: 2022-10-06 | Disposition: A | Discharge status: Home / Self Care | Payer: Medicare Other | Source: Ambulatory Visit | Attending: Physician Assistant | Admitting: Physician Assistant

## 2022-10-06 MED ORDER — IOPAMIDOL (ISOVUE-300) INJECTION 61%
100.0000 mL | Freq: Once | INTRAVENOUS | Status: AC | PRN
  Administered 2022-10-06: 100 mL via INTRAVENOUS

## 2022-10-07 ENCOUNTER — Other Ambulatory Visit: Payer: Self-pay

## 2022-10-07 ENCOUNTER — Telehealth: Payer: Self-pay | Admitting: Gastroenterology

## 2022-10-07 ENCOUNTER — Inpatient Hospital Stay (HOSPITAL_COMMUNITY)
Admission: EM | Admit: 2022-10-07 | Discharge: 2022-10-25 | DRG: 330 | Disposition: A | Discharge status: Skilled Nursing Facility | Payer: Medicare Other | Source: Skilled Nursing Facility | Attending: Internal Medicine | Admitting: Internal Medicine

## 2022-10-07 DIAGNOSIS — Z881 Allergy status to other antibiotic agents status: Secondary | ICD-10-CM

## 2022-10-07 DIAGNOSIS — D123 Benign neoplasm of transverse colon: Secondary | ICD-10-CM | POA: Diagnosis not present

## 2022-10-07 DIAGNOSIS — R4589 Other symptoms and signs involving emotional state: Secondary | ICD-10-CM

## 2022-10-07 DIAGNOSIS — G8929 Other chronic pain: Secondary | ICD-10-CM | POA: Diagnosis present

## 2022-10-07 DIAGNOSIS — R159 Full incontinence of feces: Secondary | ICD-10-CM | POA: Diagnosis present

## 2022-10-07 DIAGNOSIS — N390 Urinary tract infection, site not specified: Secondary | ICD-10-CM | POA: Diagnosis not present

## 2022-10-07 DIAGNOSIS — Z79899 Other long term (current) drug therapy: Secondary | ICD-10-CM

## 2022-10-07 DIAGNOSIS — K6389 Other specified diseases of intestine: Secondary | ICD-10-CM

## 2022-10-07 DIAGNOSIS — D490 Neoplasm of unspecified behavior of digestive system: Secondary | ICD-10-CM | POA: Diagnosis not present

## 2022-10-07 DIAGNOSIS — Z79890 Hormone replacement therapy (postmenopausal): Secondary | ICD-10-CM

## 2022-10-07 DIAGNOSIS — Z1612 Extended spectrum beta lactamase (ESBL) resistance: Secondary | ICD-10-CM | POA: Diagnosis present

## 2022-10-07 DIAGNOSIS — R1084 Generalized abdominal pain: Secondary | ICD-10-CM | POA: Diagnosis not present

## 2022-10-07 DIAGNOSIS — D509 Iron deficiency anemia, unspecified: Secondary | ICD-10-CM | POA: Diagnosis not present

## 2022-10-07 DIAGNOSIS — R933 Abnormal findings on diagnostic imaging of other parts of digestive tract: Secondary | ICD-10-CM | POA: Diagnosis not present

## 2022-10-07 DIAGNOSIS — Z9109 Other allergy status, other than to drugs and biological substances: Secondary | ICD-10-CM

## 2022-10-07 DIAGNOSIS — K5669 Other partial intestinal obstruction: Secondary | ICD-10-CM | POA: Diagnosis not present

## 2022-10-07 DIAGNOSIS — J45909 Unspecified asthma, uncomplicated: Secondary | ICD-10-CM | POA: Diagnosis present

## 2022-10-07 DIAGNOSIS — I2510 Atherosclerotic heart disease of native coronary artery without angina pectoris: Secondary | ICD-10-CM | POA: Diagnosis present

## 2022-10-07 DIAGNOSIS — D630 Anemia in neoplastic disease: Secondary | ICD-10-CM | POA: Diagnosis not present

## 2022-10-07 DIAGNOSIS — C182 Malignant neoplasm of ascending colon: Secondary | ICD-10-CM | POA: Diagnosis not present

## 2022-10-07 DIAGNOSIS — Z79891 Long term (current) use of opiate analgesic: Secondary | ICD-10-CM

## 2022-10-07 DIAGNOSIS — M810 Age-related osteoporosis without current pathological fracture: Secondary | ICD-10-CM | POA: Diagnosis present

## 2022-10-07 DIAGNOSIS — E876 Hypokalemia: Secondary | ICD-10-CM | POA: Diagnosis present

## 2022-10-07 DIAGNOSIS — Z885 Allergy status to narcotic agent status: Secondary | ICD-10-CM

## 2022-10-07 DIAGNOSIS — F32A Depression, unspecified: Secondary | ICD-10-CM | POA: Diagnosis not present

## 2022-10-07 DIAGNOSIS — I10 Essential (primary) hypertension: Secondary | ICD-10-CM | POA: Diagnosis not present

## 2022-10-07 DIAGNOSIS — C187 Malignant neoplasm of sigmoid colon: Secondary | ICD-10-CM | POA: Diagnosis not present

## 2022-10-07 DIAGNOSIS — Z87440 Personal history of urinary (tract) infections: Secondary | ICD-10-CM

## 2022-10-07 DIAGNOSIS — R197 Diarrhea, unspecified: Secondary | ICD-10-CM | POA: Diagnosis not present

## 2022-10-07 DIAGNOSIS — G43909 Migraine, unspecified, not intractable, without status migrainosus: Secondary | ICD-10-CM | POA: Diagnosis present

## 2022-10-07 DIAGNOSIS — Z87891 Personal history of nicotine dependence: Secondary | ICD-10-CM | POA: Diagnosis not present

## 2022-10-07 DIAGNOSIS — Z96642 Presence of left artificial hip joint: Secondary | ICD-10-CM | POA: Diagnosis present

## 2022-10-07 DIAGNOSIS — R634 Abnormal weight loss: Secondary | ICD-10-CM | POA: Diagnosis not present

## 2022-10-07 DIAGNOSIS — K219 Gastro-esophageal reflux disease without esophagitis: Secondary | ICD-10-CM | POA: Diagnosis present

## 2022-10-07 DIAGNOSIS — Z7189 Other specified counseling: Secondary | ICD-10-CM

## 2022-10-07 DIAGNOSIS — E039 Hypothyroidism, unspecified: Secondary | ICD-10-CM | POA: Diagnosis present

## 2022-10-07 DIAGNOSIS — Z515 Encounter for palliative care: Secondary | ICD-10-CM | POA: Diagnosis not present

## 2022-10-07 DIAGNOSIS — R112 Nausea with vomiting, unspecified: Secondary | ICD-10-CM

## 2022-10-07 DIAGNOSIS — Z66 Do not resuscitate: Secondary | ICD-10-CM | POA: Diagnosis present

## 2022-10-07 DIAGNOSIS — D638 Anemia in other chronic diseases classified elsewhere: Secondary | ICD-10-CM | POA: Diagnosis not present

## 2022-10-07 DIAGNOSIS — B964 Proteus (mirabilis) (morganii) as the cause of diseases classified elsewhere: Secondary | ICD-10-CM | POA: Diagnosis present

## 2022-10-07 DIAGNOSIS — F419 Anxiety disorder, unspecified: Secondary | ICD-10-CM | POA: Diagnosis present

## 2022-10-07 DIAGNOSIS — Z681 Body mass index (BMI) 19 or less, adult: Secondary | ICD-10-CM | POA: Diagnosis not present

## 2022-10-07 DIAGNOSIS — Z9049 Acquired absence of other specified parts of digestive tract: Secondary | ICD-10-CM | POA: Diagnosis not present

## 2022-10-07 DIAGNOSIS — K56691 Other complete intestinal obstruction: Secondary | ICD-10-CM | POA: Diagnosis not present

## 2022-10-07 DIAGNOSIS — K589 Irritable bowel syndrome without diarrhea: Secondary | ICD-10-CM | POA: Diagnosis present

## 2022-10-07 DIAGNOSIS — K639 Disease of intestine, unspecified: Secondary | ICD-10-CM | POA: Diagnosis not present

## 2022-10-07 DIAGNOSIS — K760 Fatty (change of) liver, not elsewhere classified: Secondary | ICD-10-CM | POA: Diagnosis not present

## 2022-10-07 DIAGNOSIS — Z85528 Personal history of other malignant neoplasm of kidney: Secondary | ICD-10-CM

## 2022-10-07 DIAGNOSIS — R948 Abnormal results of function studies of other organs and systems: Secondary | ICD-10-CM | POA: Diagnosis present

## 2022-10-07 DIAGNOSIS — Z96651 Presence of right artificial knee joint: Secondary | ICD-10-CM | POA: Diagnosis present

## 2022-10-07 DIAGNOSIS — Z8673 Personal history of transient ischemic attack (TIA), and cerebral infarction without residual deficits: Secondary | ICD-10-CM

## 2022-10-07 DIAGNOSIS — Z85820 Personal history of malignant melanoma of skin: Secondary | ICD-10-CM

## 2022-10-07 DIAGNOSIS — L89150 Pressure ulcer of sacral region, unstageable: Secondary | ICD-10-CM | POA: Diagnosis not present

## 2022-10-07 DIAGNOSIS — Z7983 Long term (current) use of bisphosphonates: Secondary | ICD-10-CM

## 2022-10-07 DIAGNOSIS — Z8249 Family history of ischemic heart disease and other diseases of the circulatory system: Secondary | ICD-10-CM

## 2022-10-07 DIAGNOSIS — R1010 Upper abdominal pain, unspecified: Secondary | ICD-10-CM

## 2022-10-07 DIAGNOSIS — R590 Localized enlarged lymph nodes: Secondary | ICD-10-CM | POA: Diagnosis present

## 2022-10-07 DIAGNOSIS — M47816 Spondylosis without myelopathy or radiculopathy, lumbar region: Secondary | ICD-10-CM | POA: Diagnosis present

## 2022-10-07 DIAGNOSIS — E785 Hyperlipidemia, unspecified: Secondary | ICD-10-CM | POA: Diagnosis not present

## 2022-10-07 DIAGNOSIS — R935 Abnormal findings on diagnostic imaging of other abdominal regions, including retroperitoneum: Secondary | ICD-10-CM | POA: Diagnosis not present

## 2022-10-07 DIAGNOSIS — C189 Malignant neoplasm of colon, unspecified: Secondary | ICD-10-CM | POA: Diagnosis not present

## 2022-10-07 DIAGNOSIS — K648 Other hemorrhoids: Secondary | ICD-10-CM | POA: Diagnosis present

## 2022-10-07 DIAGNOSIS — R636 Underweight: Secondary | ICD-10-CM | POA: Diagnosis present

## 2022-10-07 DIAGNOSIS — E739 Lactose intolerance, unspecified: Secondary | ICD-10-CM | POA: Diagnosis present

## 2022-10-07 DIAGNOSIS — D122 Benign neoplasm of ascending colon: Secondary | ICD-10-CM | POA: Diagnosis not present

## 2022-10-07 DIAGNOSIS — Z86010 Personal history of colonic polyps: Secondary | ICD-10-CM

## 2022-10-07 DIAGNOSIS — I5189 Other ill-defined heart diseases: Secondary | ICD-10-CM

## 2022-10-07 DIAGNOSIS — D126 Benign neoplasm of colon, unspecified: Secondary | ICD-10-CM

## 2022-10-07 DIAGNOSIS — Z743 Need for continuous supervision: Secondary | ICD-10-CM | POA: Diagnosis not present

## 2022-10-07 DIAGNOSIS — R109 Unspecified abdominal pain: Secondary | ICD-10-CM | POA: Diagnosis not present

## 2022-10-07 DIAGNOSIS — R531 Weakness: Secondary | ICD-10-CM | POA: Diagnosis not present

## 2022-10-07 DIAGNOSIS — Z85828 Personal history of other malignant neoplasm of skin: Secondary | ICD-10-CM

## 2022-10-07 LAB — CBC WITH DIFFERENTIAL/PLATELET
Abs Immature Granulocytes: 0.03 10*3/uL (ref 0.00–0.07)
Basophils Absolute: 0 10*3/uL (ref 0.0–0.1)
Basophils Relative: 0 %
Eosinophils Absolute: 0 10*3/uL (ref 0.0–0.5)
Eosinophils Relative: 1 %
HCT: 37.6 % (ref 36.0–46.0)
Hemoglobin: 12 g/dL (ref 12.0–15.0)
Immature Granulocytes: 0 %
Lymphocytes Relative: 25 %
Lymphs Abs: 2.1 10*3/uL (ref 0.7–4.0)
MCH: 27 pg (ref 26.0–34.0)
MCHC: 31.9 g/dL (ref 30.0–36.0)
MCV: 84.7 fL (ref 80.0–100.0)
Monocytes Absolute: 0.7 10*3/uL (ref 0.1–1.0)
Monocytes Relative: 8 %
Neutro Abs: 5.6 10*3/uL (ref 1.7–7.7)
Neutrophils Relative %: 66 %
Platelets: 284 10*3/uL (ref 150–400)
RBC: 4.44 MIL/uL (ref 3.87–5.11)
RDW: 19.9 % — ABNORMAL HIGH (ref 11.5–15.5)
WBC: 8.4 10*3/uL (ref 4.0–10.5)
nRBC: 0 % (ref 0.0–0.2)

## 2022-10-07 LAB — COMPREHENSIVE METABOLIC PANEL
ALT: 10 U/L (ref 0–44)
AST: 14 U/L — ABNORMAL LOW (ref 15–41)
Albumin: 3.7 g/dL (ref 3.5–5.0)
Alkaline Phosphatase: 74 U/L (ref 38–126)
Anion gap: 10 (ref 5–15)
BUN: 11 mg/dL (ref 8–23)
CO2: 23 mmol/L (ref 22–32)
Calcium: 9.1 mg/dL (ref 8.9–10.3)
Chloride: 102 mmol/L (ref 98–111)
Creatinine, Ser: 0.67 mg/dL (ref 0.44–1.00)
GFR, Estimated: >60 mL/min (ref >60)
Glucose, Bld: 107 mg/dL — ABNORMAL HIGH (ref 70–99)
Potassium: 3.9 mmol/L (ref 3.5–5.1)
Sodium: 135 mmol/L (ref 135–145)
Total Bilirubin: 0.6 mg/dL (ref 0.3–1.2)
Total Protein: 6.9 g/dL (ref 6.5–8.1)

## 2022-10-07 MED ORDER — HYDROMORPHONE HCL 1 MG/ML IJ SOLN
0.5000 mg | INTRAMUSCULAR | Status: DC | PRN
  Administered 2022-10-10 – 2022-10-17 (×23): 0.5 mg via INTRAVENOUS
  Filled 2022-10-07 (×25): qty 0.5

## 2022-10-07 MED ORDER — LEVOTHYROXINE SODIUM 50 MCG PO TABS
75.0000 ug | ORAL_TABLET | Freq: Every day | ORAL | Status: DC
  Administered 2022-10-08 – 2022-10-25 (×16): 75 ug via ORAL
  Filled 2022-10-07 (×17): qty 1

## 2022-10-07 MED ORDER — ACETAMINOPHEN 325 MG PO TABS: 650.0000 mg | ORAL_TABLET | Freq: Four times a day (QID) | ORAL | Status: DC | PRN

## 2022-10-07 MED ORDER — BUSPIRONE HCL 5 MG PO TABS
5.0000 mg | ORAL_TABLET | Freq: Three times a day (TID) | ORAL | Status: DC | PRN
  Administered 2022-10-11 – 2022-10-21 (×3): 5 mg via ORAL
  Filled 2022-10-07 (×4): qty 1

## 2022-10-07 MED ORDER — FERROUS SULFATE 325 (65 FE) MG PO TABS
325.0000 mg | ORAL_TABLET | Freq: Every day | ORAL | Status: DC
  Administered 2022-10-08: 325 mg via ORAL
  Filled 2022-10-07: qty 1

## 2022-10-07 MED ORDER — PROCHLORPERAZINE EDISYLATE 10 MG/2ML IJ SOLN
10.0000 mg | Freq: Four times a day (QID) | INTRAMUSCULAR | Status: AC | PRN
  Administered 2022-10-07 – 2022-10-08 (×2): 10 mg via INTRAVENOUS
  Filled 2022-10-07 (×2): qty 2

## 2022-10-07 MED ORDER — GABAPENTIN 100 MG PO CAPS
100.0000 mg | ORAL_CAPSULE | Freq: Two times a day (BID) | ORAL | Status: DC
  Administered 2022-10-07 – 2022-10-25 (×12): 100 mg via ORAL
  Filled 2022-10-07 (×23): qty 1

## 2022-10-07 MED ORDER — ROSUVASTATIN CALCIUM 10 MG PO TABS
10.0000 mg | ORAL_TABLET | Freq: Every day | ORAL | Status: DC
  Administered 2022-10-07 – 2022-10-24 (×16): 10 mg via ORAL
  Filled 2022-10-07 (×18): qty 1

## 2022-10-07 MED ORDER — HYDROMORPHONE HCL 1 MG/ML IJ SOLN
0.5000 mg | Freq: Once | INTRAMUSCULAR | Status: AC
  Administered 2022-10-07: 0.5 mg via INTRAVENOUS
  Filled 2022-10-07: qty 0.5

## 2022-10-07 MED ORDER — HYDROMORPHONE HCL 1 MG/ML IJ SOLN
0.5000 mg | Freq: Once | INTRAMUSCULAR | Status: AC
  Administered 2022-10-07: 0.5 mg via INTRAVENOUS
  Filled 2022-10-07: qty 1

## 2022-10-07 MED ORDER — TAPENTADOL HCL 50 MG PO TABS: 50.0000 mg | ORAL_TABLET | Freq: Two times a day (BID) | ORAL | Status: DC

## 2022-10-07 MED ORDER — ONDANSETRON HCL 4 MG PO TABS
4.0000 mg | ORAL_TABLET | Freq: Four times a day (QID) | ORAL | Status: DC | PRN
  Filled 2022-10-07: qty 1

## 2022-10-07 MED ORDER — HYDROMORPHONE HCL 1 MG/ML IJ SOLN
0.5000 mg | INTRAMUSCULAR | Status: DC | PRN
  Administered 2022-10-07: 0.5 mg via INTRAVENOUS
  Filled 2022-10-07: qty 1

## 2022-10-07 MED ORDER — LORATADINE 10 MG PO TABS
10.0000 mg | ORAL_TABLET | Freq: Every day | ORAL | Status: DC
  Administered 2022-10-08 – 2022-10-25 (×16): 10 mg via ORAL
  Filled 2022-10-07 (×17): qty 1

## 2022-10-07 MED ORDER — ONDANSETRON HCL 4 MG/2ML IJ SOLN
4.0000 mg | Freq: Four times a day (QID) | INTRAMUSCULAR | Status: DC | PRN
  Administered 2022-10-07 – 2022-10-20 (×7): 4 mg via INTRAVENOUS
  Filled 2022-10-07 (×8): qty 2

## 2022-10-07 MED ORDER — FLUTICASONE PROPIONATE 50 MCG/ACT NA SUSP
2.0000 | Freq: Every day | NASAL | Status: DC
  Administered 2022-10-08 – 2022-10-24 (×10): 2 via NASAL
  Filled 2022-10-07: qty 16

## 2022-10-07 MED ORDER — ACETAMINOPHEN 650 MG RE SUPP: 650.0000 mg | Freq: Four times a day (QID) | RECTAL | Status: DC | PRN

## 2022-10-07 MED ORDER — VITAMIN B-12 1000 MCG PO TABS
500.0000 ug | ORAL_TABLET | Freq: Every day | ORAL | Status: DC
  Administered 2022-10-11 – 2022-10-25 (×15): 500 ug via ORAL
  Filled 2022-10-07 (×17): qty 1

## 2022-10-07 MED ORDER — SUTAB 1479-225-188 MG PO TABS: ORAL_TABLET | ORAL | 0 refills | Status: DC

## 2022-10-07 MED ORDER — TRIMETHOPRIM 100 MG PO TABS
100.0000 mg | ORAL_TABLET | Freq: Every day | ORAL | Status: DC
  Administered 2022-10-07 – 2022-10-20 (×14): 100 mg via ORAL
  Filled 2022-10-07 (×14): qty 1

## 2022-10-07 MED ORDER — ONDANSETRON HCL 4 MG/2ML IJ SOLN
4.0000 mg | Freq: Once | INTRAMUSCULAR | Status: AC
  Administered 2022-10-07: 4 mg via INTRAVENOUS
  Filled 2022-10-07: qty 2

## 2022-10-07 MED ORDER — TAPENTADOL HCL 50 MG PO TABS
50.0000 mg | ORAL_TABLET | Freq: Two times a day (BID) | ORAL | Status: DC
  Administered 2022-10-08 – 2022-10-25 (×34): 50 mg via ORAL
  Filled 2022-10-07 (×35): qty 1

## 2022-10-07 MED ORDER — SODIUM CHLORIDE 0.9 % IV SOLN: INTRAVENOUS | Status: AC

## 2022-10-07 MED ORDER — METOPROLOL SUCCINATE ER 25 MG PO TB24
12.5000 mg | ORAL_TABLET | Freq: Every day | ORAL | Status: DC
  Administered 2022-10-08 – 2022-10-25 (×18): 12.5 mg via ORAL
  Filled 2022-10-07 (×19): qty 1

## 2022-10-07 NOTE — ED Triage Notes (Signed)
Patient was brought in by EMS from [facility] with c/o ABD pain that radiates to her back. Per EMS pain started yesterday and worsened throughout the night. She c/o dark watery stools. 126/94 76 100% RA CGB: 117 18

## 2022-10-07 NOTE — ED Notes (Signed)
RN spoke with patient daughter and was informed that patient had CT yesterday and was told she had a mass on ascending colon and since then mother has had pain and wants to talk about hospice/palliative care

## 2022-10-07 NOTE — ED Provider Notes (Signed)
[place] EMERGENCY DEPARTMENT AT [facility] Provider Note   CSN: [id number] Arrival date & time: 10/07/22  1051     History  Chief Complaint  Patient presents with   Abdominal Pain    Kerri [name] is a 89 y.o. female.  Patient complains of abdominal pain.  Patient had a CT scan done today which shows possible mass in her colon.  Patient has a history of iron deficiency  The history is provided by the patient and medical records. No language interpreter was used.  Abdominal Pain Pain location:  Generalized Pain quality: aching   Pain radiates to:  Does not radiate Pain severity:  Mild Onset quality:  Sudden Timing:  Constant Progression:  Worsening Chronicity:  New Context: not alcohol use   Relieved by:  Nothing Worsened by:  Nothing Ineffective treatments:  None tried Associated symptoms: no chest pain, no cough, no diarrhea, no fatigue and no hematuria        Home Medications Prior to Admission medications   Medication Sig Start Date End Date Taking? Authorizing Provider  alendronate (FOSAMAX) 70 MG tablet Take 70 mg by mouth every Sunday. 11/15/18   [provider]  Biotin 5 MG CAPS Take 5 mg by mouth daily.    [provider]  calcium carbonate (OS-CAL) 1250 (500 Ca) MG chewable tablet Chew 2 tablets by mouth daily.    [provider]  Cholecalciferol (VITAMIN D3) 50 MCG (2000 UT) TABS Take 2,000 Units by mouth daily.    [provider]  clobetasol (OLUX) 0.05 % topical foam Apply topically 2 (two) times daily. 11/24/21   Sheffield, Harvin Hazel R, PA-C  collagenase (SANTYL) 250 UNIT/GM ointment Apply topically daily. Apply to sacral Unstageable Pressure injury in a 1/8 inch layer once daily and PRN soiling of dressing indivating replacement 09/12/21   Glade Lloyd, MD  estradiol (ESTRACE) 0.1 MG/GM vaginal cream Place 1 Applicatorful vaginally every other day. 06/06/19   [provider]  ferrous sulfate 325 (65 FE) MG  tablet Take 1 tablet (325 mg total) by mouth daily with breakfast. 08/15/21   Lanae Boast, MD  Flurandrenolide [facility]) 4 MCG/SQCM TAPE Apply 1 each topically daily as needed (eczema dermatitis). 09/12/21   Glade Lloyd, MD  fluticasone (FLONASE) 50 MCG/ACT nasal spray Place 2 sprays into both nostrils every morning.  03/29/19   [provider]  folic acid (FOLVITE) 1 MG tablet Take 1 tablet (1 mg total) by mouth daily. 09/12/21   Glade Lloyd, MD  gabapentin (NEURONTIN) 100 MG capsule Take 100 mg by mouth 3 (three) times daily.    [provider]  levothyroxine (SYNTHROID, LEVOTHROID) 75 MCG tablet Take 75 mcg by mouth daily with breakfast.    [provider]  loratadine (CLARITIN) 10 MG tablet Take 10 mg by mouth daily.    [provider]  metoprolol succinate (TOPROL-XL) 25 MG 24 hr tablet Take 0.5 tablets (12.5 mg total) by mouth daily. 09/12/21   Glade Lloyd, MD  NUCYNTA 50 MG tablet Take 50 mg by mouth 2 (two) times daily. 10/28/21   [provider]  rosuvastatin (CRESTOR) 10 MG tablet Take 10 mg by mouth at bedtime.    [provider]  trimethoprim (TRIMPEX) 100 MG tablet Take 100 mg by mouth daily. 12/16/21   [provider]  vitamin B-12 (CYANOCOBALAMIN) 500 MCG tablet Take 500 mcg by mouth daily.    [provider]      Allergies    Buprenorphine  hcl, Codeine, Morphine, Morphine and codeine, Tramadol, Adhesive [tape], Doxycycline hyclate, Hydrocodone-acetaminophen, Other, Oxycodone hcl, Tapentadol, Tapentadol hcl, Tramadol hcl, Hydrocodone-acetaminophen, Lactose intolerance (gi), and Oxycodone    Review of Systems   Review of Systems  Constitutional:  Negative for appetite change and fatigue.  HENT:  Negative for congestion, ear discharge and sinus pressure.   Eyes:  Negative for discharge.  Respiratory:  Negative for cough.   Cardiovascular:  Negative for chest pain.  Gastrointestinal:  Positive for abdominal  pain. Negative for diarrhea.  Genitourinary:  Negative for frequency and hematuria.  Musculoskeletal:  Negative for back pain.  Skin:  Negative for rash.  Neurological:  Negative for seizures and headaches.  Psychiatric/Behavioral:  Negative for hallucinations.     Physical Exam Updated Vital Signs BP 97/81   Pulse 76   Temp 98.6 F (37 C) (Oral)   Resp 16   Ht 5\' 5"  (1.651 m)   Wt 58 kg   SpO2 97%   BMI 21.28 kg/m  Physical Exam Vitals and nursing note reviewed.  Constitutional:      Appearance: She is well-developed.  HENT:     Head: Normocephalic.     Nose: Nose normal.  Eyes:     General: No scleral icterus.    Conjunctiva/sclera: Conjunctivae normal.  Neck:     Thyroid: No thyromegaly.  Cardiovascular:     Rate and Rhythm: Normal rate and regular rhythm.     Heart sounds: No murmur heard.    No friction rub. No gallop.  Pulmonary:     Breath sounds: No stridor. No wheezing or rales.  Chest:     Chest wall: No tenderness.  Abdominal:     General: There is no distension.     Tenderness: There is abdominal tenderness. There is no rebound.  Musculoskeletal:        General: Normal range of motion.     Cervical back: Neck supple.  Lymphadenopathy:     Cervical: No cervical adenopathy.  Skin:    Findings: No erythema or rash.  Neurological:     Mental Status: She is alert and oriented to person, place, and time.     Motor: No abnormal muscle tone.     Coordination: Coordination normal.  Psychiatric:        Behavior: Behavior normal.     ED Results / Procedures / Treatments   Labs (all labs ordered are listed, but only abnormal results are displayed) Labs Reviewed  CBC WITH DIFFERENTIAL/PLATELET - Abnormal; Notable for the following components:      Result Value   RDW 19.9 (*)    All other components within normal limits  COMPREHENSIVE METABOLIC PANEL - Abnormal; Notable for the following components:   Glucose, Bld 107 (*)    AST 14 (*)    All other  components within normal limits    EKG None  Radiology CT ABDOMEN PELVIS W CONTRAST  Result Date: 10/06/2022 CLINICAL DATA:  Abdominal pain, constipation EXAM: CT ABDOMEN AND PELVIS WITH CONTRAST TECHNIQUE: Multidetector CT imaging of the abdomen and pelvis was performed using the standard protocol following bolus administration of intravenous contrast. RADIATION DOSE REDUCTION: This exam was performed according to the departmental dose-optimization program which includes automated exposure control, adjustment of the mA and/or kV according to patient size and/or use of iterative reconstruction technique. CONTRAST:  ISOVUE-300 IOPAMIDOL (ISOVUE-300) INJECTION 61% COMPARISON:  Previous studies including the examination of 08/09/2021 FINDINGS: Lower chest: Small linear densities are seen in lower  lung fields suggesting scarring or subsegmental atelectasis. Hepatobiliary: There are few subcentimeter low-density foci in liver with no significant interval change, possibly cysts or hemangiomas. There is no dilation of bile ducts. Gallbladder is unremarkable. Pancreas: No focal abnormalities are seen. Spleen: Unremarkable. Adrenals/Urinary Tract: Adrenals are unremarkable. There is no hydronephrosis. There is cortical thinning in the upper pole of left kidney. There are no renal or ureteral stones. There is 7 mm low-density in the lower pole of right kidney suggesting renal cyst. Evaluation of the urinary bladder is limited by beam hardening artifacts. Stomach/Bowel: Small hiatal hernia is seen. Stomach is unremarkable. Small bowel loops are unremarkable. Cecum is higher than usual in position. There is focal circumferential wall thickening in ascending colon measuring 1.3 cm in wall thickness. Length of this lesion is 4.8 cm. Cecum and ascending colon proximal to this lesion are distended. In image 45 of series 6, there is a tubular structure with air in the lumen with blind tip in upper mid abdomen,  possibly normal appendix. Moderate to large amount of stool is seen in colon. Vascular/Lymphatic: Scattered atherosclerotic plaques and calcifications are seen in aorta and its major branches. There are enlarged lymph nodes in mesentery with interval increase in size. Largest of the mesenteric nodes measures 1.6 x 0.9 cm medial to the ascending colon. Reproductive: Unremarkable. Other: There is no ascites or pneumoperitoneum. Musculoskeletal: There is previous kyphoplasty in sacrum. Degenerative changes are noted in the lumbar spine. There is previous internal fixation of intertrochanteric fracture of right femur. There is previous left hip arthroplasty. IMPRESSION: There is circumferential wall thickening in ascending colon with distention of cecum and ascending colon proximal to this lesion. Possibility of malignant neoplasm such as carcinoma in the ascending colon is not excluded. Another possibility would be inflammatory/infectious process. Colonoscopy as clinically warranted may be considered. There are few enlarged mesenteric lymph nodes largest medial to the ascending colon. This may suggest metastatic lymphadenopathy or inflammatory or infectious process. There is no evidence of small-bowel obstruction. There is no hydronephrosis. Other findings as described in the body of the report. Electronically Signed   By: Ernie Avena M.D.   On: 10/06/2022 14:38    Procedures Procedures    Medications Ordered in ED Medications  HYDROmorphone (DILAUDID) injection 0.5 mg (0.5 mg Intravenous Given 10/07/22 1136)  ondansetron (ZOFRAN) injection 4 mg (4 mg Intravenous Given 10/07/22 1137)    ED Course/ Medical Decision Making/ A&P                             Medical Decision Making Amount and/or Complexity of Data Reviewed Labs: ordered.  Risk Prescription drug management. Decision regarding hospitalization.  This patient presents to the ED for concern of abdominal pain, this involves an  extensive number of treatment options, and is a complaint that carries with it a high risk of complications and morbidity.  The differential diagnosis includes abscess, cancer, cholecystitis   Co morbidities that complicate the patient evaluation  Iron deficient anemia   Additional history obtained:  Additional history obtained from daughter External records from outside source obtained and reviewed including hospital records   Lab Tests:  I Ordered, and personally interpreted labs.  The pertinent results include: CBC unremarkable   Imaging Studies ordered:  I ordered imaging studies including CT abdomen I independently visualized and interpreted imaging which showed colonic mass I agree with the radiologist interpretation   Cardiac Monitoring: / EKG:  The  patient was maintained on a cardiac monitor.  I personally viewed and interpreted the cardiac monitored which showed an underlying rhythm of: Normal sinus rhythm   Consultations Obtained:  I requested consultation with the GI and hospitalist,  and discussed lab and imaging findings as well as pertinent plan - they recommend: Admit to hospitalist with GI consult   Problem List / ED Course / Critical interventions / Medication management  Iron deficient anemia, colonic mass I ordered medication including Dilaudid for pain Reevaluation of the patient after these medicines showed that the patient improved I have reviewed the patients home medicines and have made adjustments as needed   Social Determinants of Health:  None   Test / Admission - Considered:  None  Patient with abdominal pain and colonic mass.  She will be admitted to medicine with GI consult        Final Clinical Impression(s) / ED Diagnoses Final diagnoses:  Pain of upper abdomen    Rx / DC Orders ED Discharge Orders     None         Bethann Berkshire, MD 10/07/22 2010

## 2022-10-07 NOTE — H&P (Signed)
History and Physical    Patient: Kerri [name] BMW:[id number] DOB: 04-15-1930 DOA: 10/07/2022 DOS: the patient was seen and examined on 10/07/2022 PCP: Delma Officer, PA  Patient coming from: SNF  Chief Complaint:  Chief Complaint  Patient presents with   Abdominal Pain   HPI: Kerri [name] is a 89 y.o. female with medical history significant of colon polyps, asthma, basal cell carcinoma of the left forehead, CAD, carotid bruit, chronic back pain, depression/anxiety, diverticulosis, DJD, hyperlipidemia, history of NSVT fatty liver disease, GERD, hypertension, hypothyroidism, IBS, metastatic melanoma of LLE with multiple excisions, multiple skin squamous cell carcinoma lesions, migraine headaches, osteoporosis, spinal stenosis who was recently admitted and discharged due to hip fracture, multiple punctuated CVAs, ESBL UTI who brought to the emergency department from [facility] due to abdominal pain radiated to her back for the past 2 weeks associated with decreased oral intake, 3 recent episodes of emesis and recent dark diarrhea bowel movements.  She has lost about 10 pounds in the last month.  No constipation or hematochezia. No fever, chills or night sweats. No sore throat, rhinorrhea, dyspnea, wheezing or hemoptysis.  No chest pain, palpitations, diaphoresis, PND, orthopnea or pitting edema of the lower extremities.    No flank pain, dysuria, frequency or hematuria.  No polyuria, polydipsia, polyphagia or blurred vision.  Lab work: Her CBC showed a white count of 8.4, hemoglobin 12.0 g/dL platelets 272.  CMP with a glucose of 107 mg deciliter and AST 14 units/L.  The rest of the CMP measurements were normal.  Imaging: CT abdomen/pelvis with contrast showed a circumferential wall thickening of the ascending colon with distention of the cecum and proximal to the lesion ascending colon.  Mildly nonneoplastic and, inflammatory or infectious process cannot be ruled out.  Colonoscopy may be considered.   There are a few enlarged mesenteric lymph nodes, largest medial to the ascending colon.  These could be metastatic lymphadenopathy or inflammatory or infectious.  No hydronephrosis, no evidence of bowel obstruction.  ED course: Initial vital signs were temperature 97.3 F, pulse 86, respiration 18, BP 142/110 mmHg and O2 sat 100% on room air.  The patient received hydromorphone 0.5 mg IVP and ondansetron 4 mg IVP.   Review of Systems: As mentioned in the history of present illness. All other systems reviewed and are negative. Past Medical History:  Diagnosis Date   Adenomatous colon polyp    Asthma    Basal cell carcinoma 03/13/2017   left forehead - CX3 + cautery + 5FU   CAD (coronary artery disease)    Chest CTA 10/07: 40% or less pLAD;  ETT 8/09: negative   Carotid bruit    Chronic back pain    Depression    Diverticulosis    DJD (degenerative joint disease)    Dyslipidemia    Dysrhythmia    pvcs   Fatty liver    GERD (gastroesophageal reflux disease)    HTN (hypertension)    Hypothyroidism    IBS (irritable bowel syndrome)    Internal hemorrhoid    Melanoma (HCC)    metastatic; left leg; s/p multiple excisions   Migraine    Osteoporosis    Spinal stenosis    Squamous cell carcinoma of skin 09/02/2015   mid chest - CX3 + 5FU   Squamous cell carcinoma of skin 11/07/2017   left wrist - tx p bx   Squamous cell carcinoma of skin 12/11/2017   left hand - tx p bx   Past Surgical History:  Procedure Laterality Date   BACK SURGERY     BLEPHAROPLASTY     BUNIONECTOMY WITH HAMMERTOE RECONSTRUCTION Right 01/16/2013   Procedure: RIGHT Quintella Reichert OSTEOTOMY WITH SIMPLE BUNIONECTOMY AND SECOND HAMMER TOE CORRECTION;  Surgeon: Toni Arthurs, MD;  Location: [facility];  Service: Orthopedics;  Laterality: Right;   CARPAL TUNNEL RELEASE     FEMUR IM NAIL Right 08/10/2021   Procedure: INTRAMEDULLARY (IM) NAIL FEMORAL;  Surgeon: Samson Frederic, MD;  Location: [facility];  Service:  Orthopedics;  Laterality: Right;   KNEE ARTHROSCOPY Right    LUMBAR LAMINECTOMY/DECOMPRESSION MICRODISCECTOMY N/A 11/13/2013   Procedure: LUMBAR TWO-THREE,LUMBAR THREE-FOUR,LUMBAR FOUR-FIVE LAMINECTOMY AND FORAMINOTOMY;  Surgeon: Cristi Loron, MD;  Location: [facility];  Service: Neurosurgery;  Laterality: N/A;   MELANOMA EXCISION Left 09/19/2012   Procedure: WIDE EXCISION METASTATIC MELANOMA LEFT MEDIAL THIGH;  Surgeon: Liz Malady, MD;  Location: [facility];  Service: General;  Laterality: Left;   ROTATOR CUFF REPAIR     SHOULDER SURGERY     TOE SURGERY     TOTAL HIP ARTHROPLASTY  09/04/2011   Procedure: TOTAL HIP ARTHROPLASTY;  Surgeon: Loanne Drilling, MD;  Location: [facility];  Service: Orthopedics;  Laterality: Left;   TOTAL HIP REVISION Left 11/27/2018   Procedure: Left hip femoral revision;  Surgeon: Ollen Gross, MD;  Location: [facility];  Service: Orthopedics;  Laterality: Left;    TOTAL KNEE ARTHROPLASTY Right 10/06/2019   Procedure: TOTAL KNEE ARTHROPLASTY;  Surgeon: Ollen Gross, MD;  Location: [facility];  Service: Orthopedics;  Laterality: Right;    Social History:  reports that she quit smoking about 74 years ago. Her smoking use included cigarettes. She has never used smokeless tobacco. She reports current alcohol use of about 2.0 standard drinks of alcohol per week. She reports that she does not use drugs.  Allergies  Allergen Reactions   Buprenorphine Hcl Itching and Other (See Comments)    Happened awhile back- patient noted that any narcotic-based medication makes her have a "bad reaction" Other reaction(s): Other Happened a while back. Patient noted that any narcotic based medication makes her have a bad reaction. Happened a while back. Patient noted that any narcotic based medication makes her have a bad reaction. Happened awhile back- patient noted that any narcotic-based medication makes her have a "bad reaction"   Codeine Nausea And  Vomiting and Other (See Comments)    Patient gets sick to her stomach Other reaction(s): nausea   Morphine Itching and Other (See Comments)    Happened awhile back- patient noted that any narcotic-based medication makes her have a "bad reaction" Other reaction(s): itching, Other Happened a while back. Patient noted that any narcotic based medication makes her have a bad reaction. Happened awhile back- patient noted that any narcotic-based medication makes her have a "bad reaction"    Morphine And Codeine Itching and Other (See Comments)    Happened awhile back- patient noted that any narcotic-based medication makes her have a "bad reaction"   Tramadol Nausea And Vomiting and Other (See Comments)    Confusion and severe headaches, also   Adhesive [Tape] Other (See Comments)    Tears the skin    Doxycycline Hyclate Other (See Comments)    Makes the patient "ill" Other reaction(s): ill, Other Makes the patient "ill"    Hydrocodone-Acetaminophen Nausea And Vomiting    Other reaction(s): Other   Other Other (See Comments)    Other reaction(s): headache, dizzy and nausea  Other reaction(s): Other   Oxycodone Hcl     Other reaction(s): Unknown   Tapentadol Other (See Comments)    Dizziness with doses higher than 50 mg twice a day Other reaction(s): Other Dizziness with doses higher than 50 mg twice a day   Tapentadol Hcl     Other reaction(s): Other   Tramadol Hcl Other (See Comments)    Other reaction(s): nausea, vomiting   Hydrocodone-Acetaminophen Nausea And Vomiting   Lactose Intolerance (Gi) Nausea Only and Other (See Comments)    GI upset    Oxycodone Nausea And Vomiting    Other reaction(s): nausea, vomiting    Family History  Problem Relation Age of Onset   CAD Mother    CAD Father    Kidney disease Child     Prior to Admission medications   Medication Sig Start Date End Date Taking? Authorizing Provider  alendronate (FOSAMAX) 70 MG tablet Take 70 mg by mouth  every Sunday. 11/15/18   [provider]  Biotin 5 MG CAPS Take 5 mg by mouth daily.    [provider]  calcium carbonate (OS-CAL) 1250 (500 Ca) MG chewable tablet Chew 2 tablets by mouth daily.    [provider]  Cholecalciferol (VITAMIN D3) 50 MCG (2000 UT) TABS Take 2,000 Units by mouth daily.    [provider]  clobetasol (OLUX) 0.05 % topical foam Apply topically 2 (two) times daily. 11/24/21   Sheffield, Harvin Hazel R, PA-C  collagenase (SANTYL) 250 UNIT/GM ointment Apply topically daily. Apply to sacral Unstageable Pressure injury in a 1/8 inch layer once daily and PRN soiling of dressing indivating replacement 09/12/21   Glade Lloyd, MD  estradiol (ESTRACE) 0.1 MG/GM vaginal cream Place 1 Applicatorful vaginally every other day. 06/06/19   [provider]  ferrous sulfate 325 (65 FE) MG tablet Take 1 tablet (325 mg total) by mouth daily with breakfast. 08/15/21   Lanae Boast, MD  Flurandrenolide [facility]) 4 MCG/SQCM TAPE Apply 1 each topically daily as needed (eczema dermatitis). 09/12/21   Glade Lloyd, MD  fluticasone (FLONASE) 50 MCG/ACT nasal spray Place 2 sprays into both nostrils every morning.  03/29/19   [provider]  folic acid (FOLVITE) 1 MG tablet Take 1 tablet (1 mg total) by mouth daily. 09/12/21   Glade Lloyd, MD  gabapentin (NEURONTIN) 100 MG capsule Take 100 mg by mouth 3 (three) times daily.    [provider]  levothyroxine (SYNTHROID, LEVOTHROID) 75 MCG tablet Take 75 mcg by mouth daily with breakfast.    [provider]  loratadine (CLARITIN) 10 MG tablet Take 10 mg by mouth daily.    [provider]  metoprolol succinate (TOPROL-XL) 25 MG 24 hr tablet Take 0.5 tablets (12.5 mg total) by mouth daily. 09/12/21   Glade Lloyd, MD  NUCYNTA 50 MG tablet Take 50 mg by mouth 2 (two) times daily. 10/28/21   [provider]  rosuvastatin (CRESTOR) 10 MG tablet Take 10 mg by mouth at bedtime.     [provider]  trimethoprim (TRIMPEX) 100 MG tablet Take 100 mg by mouth daily. 12/16/21   [provider]  vitamin B-12 (CYANOCOBALAMIN) 500 MCG tablet Take 500 mcg by mouth daily.    [provider]    Physical Exam: Vitals:   10/07/22 1109 10/07/22 1110 10/07/22 1400 10/07/22 1414  BP: (!) 142/110  97/81 97/81  Pulse: 86  71 76  Resp: 18   16  Temp: (!) 97.3 F (36.3 C)  98.6 F (37 C)  TempSrc: Oral   Oral  SpO2: 100%  97% 97%  Weight:  58 kg    Height:  5\' 5"  (1.651 m)     Physical Exam Vitals and nursing note reviewed.  Constitutional:      General: She is awake. She is not in acute distress.    Appearance: She is well-developed and normal weight.  HENT:     Head: Normocephalic.     Nose: No rhinorrhea.     Mouth/Throat:     Mouth: Mucous membranes are dry.     Pharynx: No oropharyngeal exudate.  Eyes:     General: No scleral icterus.    Pupils: Pupils are equal, round, and reactive to light.  Neck:     Vascular: No JVD.  Cardiovascular:     Rate and Rhythm: Normal rate and regular rhythm.     Heart sounds: S1 normal and S2 normal.  Pulmonary:     Effort: Pulmonary effort is normal.     Breath sounds: Normal breath sounds.  Abdominal:     General: Bowel sounds are normal.     Palpations: Abdomen is soft.     Tenderness: There is generalized abdominal tenderness. There is no right CVA tenderness, left CVA tenderness, guarding or rebound.     Comments: Mild distention.  Musculoskeletal:     Cervical back: Neck supple.     Right lower leg: No edema.     Left lower leg: No edema.  Skin:    General: Skin is warm and dry.  Neurological:     General: No focal deficit present.     Mental Status: She is alert and oriented to person, place, and time.  Psychiatric:        Mood and Affect: Mood normal.        Behavior: Behavior normal. Behavior is cooperative.     Data Reviewed:  There are no new results to review at this  time.  12/20/2021 TTE IMPRESSIONS:   1. Left ventricular ejection fraction, by estimation, is 65 to 70%. The  left ventricle has normal function. The left ventricle has no regional  wall motion abnormalities. There is moderate asymmetric left ventricular  hypertrophy of the basal-septal  segment. Left ventricular diastolic parameters are consistent with Grade I  diastolic dysfunction (impaired relaxation).   2. Right ventricular systolic function is normal. The right ventricular  size is normal. There is normal pulmonary artery systolic pressure.   3. The mitral valve is normal in structure. Trivial mitral valve  regurgitation. No evidence of mitral stenosis.   4. The aortic valve is calcified. Aortic valve regurgitation is trivial.  Mild to moderate aortic valve stenosis. Aortic valve mean gradient  measures 20.0 mmHg. DVI of 0.30. Normal LV stroke volume index.   5. The inferior vena cava is normal in size with greater than 50%  respiratory variability, suggesting right atrial pressure of 3 mmHg.    Assessment and Plan: Principal Problem:   Lesion of colon Observation/MedSurg. Diet and n.p.o. orders per GI. Continue antiemetics as needed. Continue analgesics as needed. Continue regular Nucynta 50 mg p.o. twice daily. GI has been consulted/will follow recommendations.  Active Problems:   Hypothyroidism : Continue levothyroxine 75 mcg p.o. daily.    Hyperlipidemia Continue rosuvastatin 10 mg p.o. daily.    Essential hypertension Continue metoprolol 12.5 mg p.o. daily.    Coronary atherosclerosis No longer on antiplatelet therapy. Continue beta-blocker and statin.    Grade I  diastolic dysfunction  No signs of decompensation. Continue beta-blocker. Follow-up with cardiology as an outpatient.    History of recurrent UTIs Continue daily trimethoprim.     Advance Care Planning:   Code Status: DNR   Consults: [facility] Yancey Flemings, MD)  Family Communication: Her  daughter was at bedside.  Severity of Illness: The appropriate patient status for this patient is INPATIENT. Inpatient status is judged to be reasonable and necessary in order to provide the required intensity of service to ensure the patient's safety. The patient's presenting symptoms, physical exam findings, and initial radiographic and laboratory data in the context of their chronic comorbidities is felt to place them at high risk for further clinical deterioration. Furthermore, it is not anticipated that the patient will be medically stable for discharge from the hospital within 2 midnights of admission.   * I certify that at the point of admission it is my clinical judgment that the patient will require inpatient hospital care spanning beyond 2 midnights from the point of admission due to high intensity of service, high risk for further deterioration and high frequency of surveillance required.*  Author: Bobette Mo, MD 10/07/2022 2:42 PM  For on call review [email or web address].   This document was prepared using Dragon voice recognition software and may contain some unintended transcription errors.

## 2022-10-07 NOTE — Telephone Encounter (Signed)
Prescription for Sutab sent to outpatient pharmacy so that her daughter can pick it up and patient can use it for inpatient colonoscopy.

## 2022-10-07 NOTE — Consult Note (Addendum)
Referring Provider: EDP, Dr. Estell Harpin Primary Care Physician:  Delma Officer, PA Primary Gastroenterologist:  Dr. Rhea Belton  Reason for Consultation:  Abnormal CT scan   HPI: Kerri [name] is a 89 y.o. female with past medical history of coronary artery disease, chronic back pain, hypertension, hyperlipidemia, hypothyroidism, IBS, melanoma and squamous cell carcinoma of the skin.  Her daughter is at bedside.  She tells me that she has been having nausea, vomiting, abdominal pain, weight loss for a couple weeks now.  She lives at independent living.  They have been wanting her to come to the ER for evaluation for a while, but she refused.  Outpatient CT scan yesterday as follows and due to results and symptoms they sent her to the emergency department.  CT scan abdomen and pelvis with contrast on 5/17 as outpatient ordered by PCP:  IMPRESSION: There is circumferential wall thickening in ascending colon with distention of cecum and ascending colon proximal to this lesion. Possibility of malignant neoplasm such as carcinoma in the ascending colon is not excluded. Another possibility would be inflammatory/infectious process. Colonoscopy as clinically warranted may be considered. There are few enlarged mesenteric lymph nodes largest medial to the ascending colon. This may suggest metastatic lymphadenopathy or inflammatory or infectious process.   There is no evidence of small-bowel obstruction. There is no hydronephrosis.   Other findings as described in the body of the report.  Laboratory evaluation was unremarkable.  Last colonoscopy 2008 at [facility].  Patient says that she does not like Gatorade and cannot tolerate any other prep where she has to drink something.  No diarrhea, in fact she has lifelong constipation.  She has been taking MiraLAX recently so had some diarrhea as a result of that, but no other significant diarrhea.    Past Medical History:  Diagnosis Date   Adenomatous  colon polyp    Asthma    Basal cell carcinoma 03/13/2017   left forehead - CX3 + cautery + 5FU   CAD (coronary artery disease)    Chest CTA 10/07: 40% or less pLAD;  ETT 8/09: negative   Carotid bruit    Chronic back pain    Depression    Diverticulosis    DJD (degenerative joint disease)    Dyslipidemia    Dysrhythmia    pvcs   Fatty liver    GERD (gastroesophageal reflux disease)    HTN (hypertension)    Hypothyroidism    IBS (irritable bowel syndrome)    Internal hemorrhoid    Melanoma (HCC)    metastatic; left leg; s/p multiple excisions   Migraine    Osteoporosis    Spinal stenosis    Squamous cell carcinoma of skin 09/02/2015   mid chest - CX3 + 5FU   Squamous cell carcinoma of skin 11/07/2017   left wrist - tx p bx   Squamous cell carcinoma of skin 12/11/2017   left hand - tx p bx    Past Surgical History:  Procedure Laterality Date   BACK SURGERY     BLEPHAROPLASTY     BUNIONECTOMY WITH HAMMERTOE RECONSTRUCTION Right 01/16/2013   Procedure: RIGHT Quintella Reichert OSTEOTOMY WITH SIMPLE BUNIONECTOMY AND SECOND HAMMER TOE CORRECTION;  Surgeon: Toni Arthurs, MD;  Location: [facility];  Service: Orthopedics;  Laterality: Right;   CARPAL TUNNEL RELEASE     FEMUR IM NAIL Right 08/10/2021   Procedure: INTRAMEDULLARY (IM) NAIL FEMORAL;  Surgeon: Samson Frederic, MD;  Location: [facility];  Service: Orthopedics;  Laterality:  Right;   KNEE ARTHROSCOPY Right    LUMBAR LAMINECTOMY/DECOMPRESSION MICRODISCECTOMY N/A 11/13/2013   Procedure: LUMBAR TWO-THREE,LUMBAR THREE-FOUR,LUMBAR FOUR-FIVE LAMINECTOMY AND FORAMINOTOMY;  Surgeon: Cristi Loron, MD;  Location: [facility];  Service: Neurosurgery;  Laterality: N/A;   MELANOMA EXCISION Left 09/19/2012   Procedure: WIDE EXCISION METASTATIC MELANOMA LEFT MEDIAL THIGH;  Surgeon: Liz Malady, MD;  Location: [facility];  Service: General;  Laterality: Left;   ROTATOR CUFF REPAIR     SHOULDER SURGERY     TOE  SURGERY     TOTAL HIP ARTHROPLASTY  09/04/2011   Procedure: TOTAL HIP ARTHROPLASTY;  Surgeon: Loanne Drilling, MD;  Location: [facility];  Service: Orthopedics;  Laterality: Left;   TOTAL HIP REVISION Left 11/27/2018   Procedure: Left hip femoral revision;  Surgeon: Ollen Gross, MD;  Location: [facility];  Service: Orthopedics;  Laterality: Left;    TOTAL KNEE ARTHROPLASTY Right 10/06/2019   Procedure: TOTAL KNEE ARTHROPLASTY;  Surgeon: Ollen Gross, MD;  Location: [facility];  Service: Orthopedics;  Laterality: Right;     Prior to Admission medications   Medication Sig Start Date End Date Taking? Authorizing Provider  alendronate (FOSAMAX) 70 MG tablet Take 70 mg by mouth every Sunday. 11/15/18  Yes [provider]  Biotin 5 MG CAPS Take 5 mg by mouth daily.   Yes [provider]  Cholecalciferol (VITAMIN D3) 50 MCG (2000 UT) TABS Take 2,000 Units by mouth daily.   Yes [provider]  ferrous sulfate 325 (65 FE) MG tablet Take 1 tablet (325 mg total) by mouth daily with breakfast. 08/15/21  Yes Kc, Ramesh, MD  fluticasone (FLONASE) 50 MCG/ACT nasal spray Place 2 sprays into both nostrils every morning.  03/29/19  Yes [provider]  gabapentin (NEURONTIN) 100 MG capsule Take 100 mg by mouth 2 (two) times daily.   Yes [provider]  levothyroxine (SYNTHROID, LEVOTHROID) 75 MCG tablet Take 75 mcg by mouth daily with breakfast.   Yes [provider]  loratadine (CLARITIN) 10 MG tablet Take 10 mg by mouth daily.   Yes [provider]  metoprolol succinate (TOPROL-XL) 25 MG 24 hr tablet Take 0.5 tablets (12.5 mg total) by mouth daily. 09/12/21  Yes Glade Lloyd, MD  NUCYNTA 50 MG tablet Take 50 mg by mouth 2 (two) times daily. 10/28/21  Yes [provider]  rosuvastatin (CRESTOR) 10 MG tablet Take 10 mg by mouth at bedtime.   Yes [provider]  trimethoprim (TRIMPEX) 100 MG tablet Take 100 mg by mouth daily.  12/16/21  Yes [provider]  vitamin B-12 (CYANOCOBALAMIN) 500 MCG tablet Take 500 mcg by mouth daily.   Yes [provider]  calcium carbonate (OS-CAL) 1250 (500 Ca) MG chewable tablet Chew 2 tablets by mouth daily. Patient not taking: Reported on 10/07/2022    [provider]  clobetasol (OLUX) 0.05 % topical foam Apply topically 2 (two) times daily. Patient not taking: Reported on 10/07/2022 11/24/21   Mackey Birchwood R, PA-C  collagenase (SANTYL) 250 UNIT/GM ointment Apply topically daily. Apply to sacral Unstageable Pressure injury in a 1/8 inch layer once daily and PRN soiling of dressing indivating replacement Patient not taking: Reported on 10/07/2022 09/12/21   Glade Lloyd, MD  Flurandrenolide [facility]) 4 MCG/SQCM TAPE Apply 1 each topically daily as needed (eczema dermatitis). Patient not taking: Reported on 10/07/2022 09/12/21   Glade Lloyd, MD  folic acid (FOLVITE) 1 MG tablet Take 1 tablet (  1 mg total) by mouth daily. Patient not taking: Reported on 10/07/2022 09/12/21   Glade Lloyd, MD    No current facility-administered medications for this encounter.   Current Outpatient Medications  Medication Sig Dispense Refill   alendronate (FOSAMAX) 70 MG tablet Take 70 mg by mouth every Sunday.     Biotin 5 MG CAPS Take 5 mg by mouth daily.     Cholecalciferol (VITAMIN D3) 50 MCG (2000 UT) TABS Take 2,000 Units by mouth daily.     ferrous sulfate 325 (65 FE) MG tablet Take 1 tablet (325 mg total) by mouth daily with breakfast.  3   fluticasone (FLONASE) 50 MCG/ACT nasal spray Place 2 sprays into both nostrils every morning.      gabapentin (NEURONTIN) 100 MG capsule Take 100 mg by mouth 2 (two) times daily.     levothyroxine (SYNTHROID, LEVOTHROID) 75 MCG tablet Take 75 mcg by mouth daily with breakfast.     loratadine (CLARITIN) 10 MG tablet Take 10 mg by mouth daily.     metoprolol succinate (TOPROL-XL) 25 MG 24 hr tablet Take 0.5 tablets (12.5 mg total)  by mouth daily. 30 tablet 0   NUCYNTA 50 MG tablet Take 50 mg by mouth 2 (two) times daily.     rosuvastatin (CRESTOR) 10 MG tablet Take 10 mg by mouth at bedtime.     trimethoprim (TRIMPEX) 100 MG tablet Take 100 mg by mouth daily.     vitamin B-12 (CYANOCOBALAMIN) 500 MCG tablet Take 500 mcg by mouth daily.     calcium carbonate (OS-CAL) 1250 (500 Ca) MG chewable tablet Chew 2 tablets by mouth daily. (Patient not taking: Reported on 10/07/2022)     clobetasol (OLUX) 0.05 % topical foam Apply topically 2 (two) times daily. (Patient not taking: Reported on 10/07/2022) 50 g 4   collagenase (SANTYL) 250 UNIT/GM ointment Apply topically daily. Apply to sacral Unstageable Pressure injury in a 1/8 inch layer once daily and PRN soiling of dressing indivating replacement (Patient not taking: Reported on 10/07/2022) 15 g 0   Flurandrenolide (CORDRAN) 4 MCG/SQCM TAPE Apply 1 each topically daily as needed (eczema dermatitis). (Patient not taking: Reported on 10/07/2022)     folic acid (FOLVITE) 1 MG tablet Take 1 tablet (1 mg total) by mouth daily. (Patient not taking: Reported on 10/07/2022) 30 tablet 0    Allergies as of 10/07/2022 - Review Complete 10/07/2022  Allergen Reaction Noted   Buprenorphine hcl Itching and Other (See Comments) 11/09/2014   Codeine Nausea And Vomiting and Other (See Comments) 11/30/2010   Morphine Itching and Other (See Comments) 11/30/2010   Morphine and codeine Itching and Other (See Comments) 11/30/2010   Tramadol Nausea And Vomiting and Other (See Comments) 11/30/2010   Adhesive [tape] Other (See Comments) 08/22/2011   Doxycycline hyclate Other (See Comments) 04/07/2021   Hydrocodone-acetaminophen Nausea And Vomiting 09/01/2011   Other Other (See Comments) 06/21/2015   Oxycodone hcl  04/12/2021   Tapentadol Other (See Comments) 04/07/2021   Tapentadol hcl  08/11/2021   Tramadol hcl Other (See Comments) 04/07/2021   Hydrocodone-acetaminophen Nausea And Vomiting 09/01/2011    Lactose intolerance (gi) Nausea Only and Other (See Comments) 08/22/2011   Oxycodone Nausea And Vomiting 09/01/2011    Family History  Problem Relation Age of Onset   CAD Mother    CAD Father    Kidney disease Child     Social History   Socioeconomic History   Marital status: Married    Spouse name: Not  on file   Number of children: 4   Years of education: Not on file   Highest education level: Not on file  Occupational History   Occupation: retired  Tobacco Use   Smoking status: Former    Types: Cigarettes    Quit date: 08/31/1948    Years since quitting: 74.1   Smokeless tobacco: Never  Vaping Use   Vaping Use: Never used  Substance and Sexual Activity   Alcohol use: Yes    Alcohol/week: 2.0 standard drinks of alcohol    Types: 2 Glasses of wine per week    Comment: wine   Drug use: No   Sexual activity: Never  Other Topics Concern   Not on file  Social History Narrative   Not on file   Social Determinants of Health   Financial Resource Strain: Not on file  Food Insecurity: Not on file  Transportation Needs: Not on file  Physical Activity: Not on file  Stress: Not on file  Social Connections: Not on file  Intimate Partner Violence: Not on file    Review of Systems: ROS is O/W negative except as mentioned in HPI.  Physical Exam: Vital signs in last 24 hours: Temp:  [97.3 F (36.3 C)-98.6 F (37 C)] 98.6 F (37 C) (05/18 1414) Pulse Rate:  [71-86] 76 (05/18 1414) Resp:  [16-18] 16 (05/18 1414) BP: (97-142)/(81-110) 97/81 (05/18 1414) SpO2:  [97 %-100 %] 97 % (05/18 1414) Weight:  [58 kg] 58 kg (05/18 1110)   General:  Alert, elderly and thin, pleasant and cooperative in NAD Head:  Normocephalic and atraumatic. Eyes:  Sclera clear, no icterus.  Conjunctiva pink. Ears:  Normal auditory acuity. Mouth:  No deformity or lesions.   Lungs:  Clear throughout to auscultation.  No wheezes, crackles, or rhonchi.  Heart:  Regular rate and rhythm; no  murmurs, clicks, rubs, or gallops. Abdomen:  Soft, non-distended.  BS present.  Mild diffuse TTP. Msk:  Symmetrical without gross deformities. Pulses:  Normal pulses noted. Extremities:  Without clubbing or edema. Neurologic:  Alert and oriented x 4;  grossly normal neurologically. Skin:  Intact without significant lesions or rashes. Psych:  Alert and cooperative. Normal mood and affect.  Lab Results: Recent Labs    10/07/22 1139  WBC 8.4  HGB 12.0  HCT 37.6  PLT 284   BMET Recent Labs    10/07/22 1139  NA 135  K 3.9  CL 102  CO2 23  GLUCOSE 107*  BUN 11  CREATININE 0.67  CALCIUM 9.1   LFT Recent Labs    10/07/22 1139  PROT 6.9  ALBUMIN 3.7  AST 14*  ALT 10  ALKPHOS 74  BILITOT 0.6   Studies/Results: CT ABDOMEN PELVIS W CONTRAST  Result Date: 10/06/2022 CLINICAL DATA:  Abdominal pain, constipation EXAM: CT ABDOMEN AND PELVIS WITH CONTRAST TECHNIQUE: Multidetector CT imaging of the abdomen and pelvis was performed using the standard protocol following bolus administration of intravenous contrast. RADIATION DOSE REDUCTION: This exam was performed according to the departmental dose-optimization program which includes automated exposure control, adjustment of the mA and/or kV according to patient size and/or use of iterative reconstruction technique. CONTRAST:  ISOVUE-300 IOPAMIDOL (ISOVUE-300) INJECTION 61% COMPARISON:  Previous studies including the examination of 08/09/2021 FINDINGS: Lower chest: Small linear densities are seen in lower lung fields suggesting scarring or subsegmental atelectasis. Hepatobiliary: There are few subcentimeter low-density foci in liver with no significant interval change, possibly cysts or hemangiomas. There is no dilation of  bile ducts. Gallbladder is unremarkable. Pancreas: No focal abnormalities are seen. Spleen: Unremarkable. Adrenals/Urinary Tract: Adrenals are unremarkable. There is no hydronephrosis. There is cortical thinning in  the upper pole of left kidney. There are no renal or ureteral stones. There is 7 mm low-density in the lower pole of right kidney suggesting renal cyst. Evaluation of the urinary bladder is limited by beam hardening artifacts. Stomach/Bowel: Small hiatal hernia is seen. Stomach is unremarkable. Small bowel loops are unremarkable. Cecum is higher than usual in position. There is focal circumferential wall thickening in ascending colon measuring 1.3 cm in wall thickness. Length of this lesion is 4.8 cm. Cecum and ascending colon proximal to this lesion are distended. In image 45 of series 6, there is a tubular structure with air in the lumen with blind tip in upper mid abdomen, possibly normal appendix. Moderate to large amount of stool is seen in colon. Vascular/Lymphatic: Scattered atherosclerotic plaques and calcifications are seen in aorta and its major branches. There are enlarged lymph nodes in mesentery with interval increase in size. Largest of the mesenteric nodes measures 1.6 x 0.9 cm medial to the ascending colon. Reproductive: Unremarkable. Other: There is no ascites or pneumoperitoneum. Musculoskeletal: There is previous kyphoplasty in sacrum. Degenerative changes are noted in the lumbar spine. There is previous internal fixation of intertrochanteric fracture of right femur. There is previous left hip arthroplasty. IMPRESSION: There is circumferential wall thickening in ascending colon with distention of cecum and ascending colon proximal to this lesion. Possibility of malignant neoplasm such as carcinoma in the ascending colon is not excluded. Another possibility would be inflammatory/infectious process. Colonoscopy as clinically warranted may be considered. There are few enlarged mesenteric lymph nodes largest medial to the ascending colon. This may suggest metastatic lymphadenopathy or inflammatory or infectious process. There is no evidence of small-bowel obstruction. There is no hydronephrosis. Other  findings as described in the body of the report. Electronically Signed   By: Ernie Avena M.D.   On: 10/06/2022 14:38    IMPRESSION:  *89 year old female with complaints of abdominal pain, nausea, vomiting, weight loss recently.  CT scan as outpatient yesterday showed circumferential wall thickening in the ascending colon and distention of the cecum and ascending colon proximal to the lesion.  Possibility of malignant neoplasm versus other inflammatory/infectious process.  PLAN: -Spoke extensively with the patient and her daughter.  Patient is agreeable to colonoscopy, but says that she cannot drink any type of prep and does not like Gatorade.  I have sent a prescription for Sutab for her daughter to pick up at an outpatient pharmacy.  She can bring that in tomorrow and I will have pharmacy verify that.  I will give her nurse instructions on how that should be given.  Colonoscopy will not be until least Monday so she would not start this prep until Sunday evening.  Clear liquids for now.   Princella Pellegrini. Littie Chiem  10/07/2022, 3:46 PM  GI ATTENDING  Labs and x-rays reviewed. Agree with comprehensive H&P as outlined above. Clinical picture and CT imaging concerning for right sided colon cancer. Plans for bowel prep and colonoscopy. The patient will only agree to pill based prep (SuTab), which my PA is arranging.  Wilhemina Bonito. Eda Keys., M.D. [facility] Division of Gastroenterology

## 2022-10-07 NOTE — ED Notes (Signed)
ED TO INPATIENT HANDOFF REPORT  ED Nurse Name and Phone #: Crist Infante RN [phone]  S Name/Age/Gender Kerri [name] 89 y.o. female Room/Bed: WA13/WA13  Code Status   Code Status: Prior  Home/SNF/Other Nursing Home Patient oriented to: self, place, time, and situation Is this baseline? Yes   Triage Complete: Triage complete  Chief Complaint Lesion of colon [K63.9]  Triage Note Patient was brought in by EMS from [facility] with c/o ABD pain that radiates to her back. Per EMS pain started yesterday and worsened throughout the night. She c/o dark watery stools. 126/94 76 100% RA CGB: 117 18   Allergies Allergies  Allergen Reactions   Buprenorphine Hcl Itching and Other (See Comments)    Happened awhile back- patient noted that any narcotic-based medication makes her have a "bad reaction" Other reaction(s): Other Happened a while back. Patient noted that any narcotic based medication makes her have a bad reaction. Happened a while back. Patient noted that any narcotic based medication makes her have a bad reaction. Happened awhile back- patient noted that any narcotic-based medication makes her have a "bad reaction"   Codeine Nausea And Vomiting and Other (See Comments)    Patient gets sick to her stomach Other reaction(s): nausea   Morphine Itching and Other (See Comments)    Happened awhile back- patient noted that any narcotic-based medication makes her have a "bad reaction" Other reaction(s): itching, Other Happened a while back. Patient noted that any narcotic based medication makes her have a bad reaction. Happened awhile back- patient noted that any narcotic-based medication makes her have a "bad reaction"    Morphine And Codeine Itching and Other (See Comments)    Happened awhile back- patient noted that any narcotic-based medication makes her have a "bad reaction"   Tramadol Nausea And Vomiting and Other (See Comments)    Confusion and severe headaches, also   Adhesive  [Tape] Other (See Comments)    Tears the skin    Doxycycline Hyclate Other (See Comments)    Makes the patient "ill" Other reaction(s): ill, Other Makes the patient "ill"    Hydrocodone-Acetaminophen Nausea And Vomiting    Other reaction(s): Other   Other Other (See Comments)    Other reaction(s): headache, dizzy and nausea Other reaction(s): Other   Oxycodone Hcl     Other reaction(s): Unknown   Tapentadol Other (See Comments)    Dizziness with doses higher than 50 mg twice a day Other reaction(s): Other Dizziness with doses higher than 50 mg twice a day   Tapentadol Hcl     Other reaction(s): Other   Tramadol Hcl Other (See Comments)    Other reaction(s): nausea, vomiting   Hydrocodone-Acetaminophen Nausea And Vomiting   Lactose Intolerance (Gi) Nausea Only and Other (See Comments)    GI upset    Oxycodone Nausea And Vomiting    Other reaction(s): nausea, vomiting    Level of Care/Admitting Diagnosis ED Disposition     ED Disposition  Admit   Condition  --   Comment  Hospital Area: [facility] [[id number]]  Level of Care: Med-Surg [16]  May place patient in observation at [facility] or [facility] if equivalent level of care is available:: No  Covid Evaluation: Asymptomatic - no recent exposure (last 10 days) testing not required  Diagnosis: Lesion of colon [454098]  Admitting Physician: Bobette Mo [[id number]]  Attending Physician: Bobette Mo [[id number]]          B Medical/Surgery History Past  Medical History:  Diagnosis Date   Adenomatous colon polyp    Asthma    Basal cell carcinoma 03/13/2017   left forehead - CX3 + cautery + 5FU   CAD (coronary artery disease)    Chest CTA 10/07: 40% or less pLAD;  ETT 8/09: negative   Carotid bruit    Chronic back pain    Depression    Diverticulosis    DJD (degenerative joint disease)    Dyslipidemia    Dysrhythmia    pvcs   Fatty liver    GERD (gastroesophageal reflux  disease)    HTN (hypertension)    Hypothyroidism    IBS (irritable bowel syndrome)    Internal hemorrhoid    Melanoma (HCC)    metastatic; left leg; s/p multiple excisions   Migraine    Osteoporosis    Spinal stenosis    Squamous cell carcinoma of skin 09/02/2015   mid chest - CX3 + 5FU   Squamous cell carcinoma of skin 11/07/2017   left wrist - tx p bx   Squamous cell carcinoma of skin 12/11/2017   left hand - tx p bx   Past Surgical History:  Procedure Laterality Date   BACK SURGERY     BLEPHAROPLASTY     BUNIONECTOMY WITH HAMMERTOE RECONSTRUCTION Right 01/16/2013   Procedure: RIGHT Quintella Reichert OSTEOTOMY WITH SIMPLE BUNIONECTOMY AND SECOND HAMMER TOE CORRECTION;  Surgeon: Toni Arthurs, MD;  Location: [facility];  Service: Orthopedics;  Laterality: Right;   CARPAL TUNNEL RELEASE     FEMUR IM NAIL Right 08/10/2021   Procedure: INTRAMEDULLARY (IM) NAIL FEMORAL;  Surgeon: Samson Frederic, MD;  Location: [facility];  Service: Orthopedics;  Laterality: Right;   KNEE ARTHROSCOPY Right    LUMBAR LAMINECTOMY/DECOMPRESSION MICRODISCECTOMY N/A 11/13/2013   Procedure: LUMBAR TWO-THREE,LUMBAR THREE-FOUR,LUMBAR FOUR-FIVE LAMINECTOMY AND FORAMINOTOMY;  Surgeon: Cristi Loron, MD;  Location: [facility];  Service: Neurosurgery;  Laterality: N/A;   MELANOMA EXCISION Left 09/19/2012   Procedure: WIDE EXCISION METASTATIC MELANOMA LEFT MEDIAL THIGH;  Surgeon: Liz Malady, MD;  Location: [facility];  Service: General;  Laterality: Left;   ROTATOR CUFF REPAIR     SHOULDER SURGERY     TOE SURGERY     TOTAL HIP ARTHROPLASTY  09/04/2011   Procedure: TOTAL HIP ARTHROPLASTY;  Surgeon: Loanne Drilling, MD;  Location: [facility];  Service: Orthopedics;  Laterality: Left;   TOTAL HIP REVISION Left 11/27/2018   Procedure: Left hip femoral revision;  Surgeon: Ollen Gross, MD;  Location: [facility];  Service: Orthopedics;  Laterality: Left;    TOTAL KNEE ARTHROPLASTY Right 10/06/2019    Procedure: TOTAL KNEE ARTHROPLASTY;  Surgeon: Ollen Gross, MD;  Location: [facility];  Service: Orthopedics;  Laterality: Right;      A IV Location/Drains/Wounds Patient Lines/Drains/Airways Status     Active Line/Drains/Airways     Name Placement date Placement time Site Days   Peripheral IV 09/10/21 20 G 1.88" Anterior;Right Forearm 09/10/21  0610  Forearm  392   Peripheral IV 12/19/21 20 G Right Antecubital 12/19/21  1830  Antecubital  292   Peripheral IV 10/07/22 20 G 1" Left Antecubital 10/07/22  1136  Antecubital  less than 1   Pressure Injury 09/09/21 Sacrum Medial;Lower Unstageable - Full thickness tissue loss in which the base of the injury is covered by slough (yellow, tan, gray, green or brown) and/or eschar (tan, brown or black) in the wound bed. 09/09/21  1600  -- 393  Pressure Injury 12/19/21 Sacrum Medial Stage 1 -  Intact skin with non-blanchable redness of a localized area usually over a bony prominence. red but intact 12/19/21  2130  -- 292   Wound / Incision (Open or Dehisced) 08/09/21  Sacrum Medial Black and bleeding 08/09/21  2345  Sacrum  424            Intake/Output Last 24 hours No intake or output data in the 24 hours ending 10/07/22 1510  Labs/Imaging Results for orders placed or performed during the hospital encounter of 10/07/22 (from the past 48 hour(s))  CBC with Differential     Status: Abnormal   Collection Time: 10/07/22 11:39 AM  Result Value Ref Range   WBC 8.4 4.0 - 10.5 K/uL   RBC 4.44 3.87 - 5.11 MIL/uL   Hemoglobin 12.0 12.0 - 15.0 g/dL   HCT 16.1 09.6 - 04.5 %   MCV 84.7 80.0 - 100.0 fL   MCH 27.0 26.0 - 34.0 pg   MCHC 31.9 30.0 - 36.0 g/dL   RDW 40.9 (H) 81.1 - 91.4 %   Platelets 284 150 - 400 K/uL   nRBC 0.0 0.0 - 0.2 %   Neutrophils Relative % 66 %   Neutro Abs 5.6 1.7 - 7.7 K/uL   Lymphocytes Relative 25 %   Lymphs Abs 2.1 0.7 - 4.0 K/uL   Monocytes Relative 8 %   Monocytes Absolute 0.7 0.1 - 1.0 K/uL   Eosinophils  Relative 1 %   Eosinophils Absolute 0.0 0.0 - 0.5 K/uL   Basophils Relative 0 %   Basophils Absolute 0.0 0.0 - 0.1 K/uL   Immature Granulocytes 0 %   Abs Immature Granulocytes 0.03 0.00 - 0.07 K/uL    Comment: Performed at [facility], [address] [place], [place] [address]  Comprehensive metabolic panel     Status: Abnormal   Collection Time: 10/07/22 11:39 AM  Result Value Ref Range   Sodium 135 135 - 145 mmol/L   Potassium 3.9 3.5 - 5.1 mmol/L   Chloride 102 98 - 111 mmol/L   CO2 23 22 - 32 mmol/L   Glucose, Bld 107 (H) 70 - 99 mg/dL    Comment: Glucose reference range applies only to samples taken after fasting for at least 8 hours.   BUN 11 8 - 23 mg/dL   Creatinine, Ser 6.21 0.44 - 1.00 mg/dL   Calcium 9.1 8.9 - 30.8 mg/dL   Total Protein 6.9 6.5 - 8.1 g/dL   Albumin 3.7 3.5 - 5.0 g/dL   AST 14 (L) 15 - 41 U/L   ALT 10 0 - 44 U/L   Alkaline Phosphatase 74 38 - 126 U/L   Total Bilirubin 0.6 0.3 - 1.2 mg/dL   GFR, Estimated >65 >78 mL/min    Comment: (NOTE) Calculated using the CKD-EPI Creatinine Equation (2021)    Anion gap 10 5 - 15    Comment: Performed at [facility], [address] [place], [place] [address]   CT ABDOMEN PELVIS W CONTRAST  Result Date: 10/06/2022 CLINICAL DATA:  Abdominal pain, constipation EXAM: CT ABDOMEN AND PELVIS WITH CONTRAST TECHNIQUE: Multidetector CT imaging of the abdomen and pelvis was performed using the standard protocol following bolus administration of intravenous contrast. RADIATION DOSE REDUCTION: This exam was performed according to the departmental dose-optimization program which includes automated exposure control, adjustment of the mA and/or kV according to patient size and/or use of iterative reconstruction technique. CONTRAST:   ISOVUE-300 IOPAMIDOL (ISOVUE-300) INJECTION 61% COMPARISON:  Previous studies including the examination of 08/09/2021 FINDINGS: Lower chest: Small linear  densities are seen in lower lung fields suggesting scarring or subsegmental atelectasis. Hepatobiliary: There are few subcentimeter low-density foci in liver with no significant interval change, possibly cysts or hemangiomas. There is no dilation of bile ducts. Gallbladder is unremarkable. Pancreas: No focal abnormalities are seen. Spleen: Unremarkable. Adrenals/Urinary Tract: Adrenals are unremarkable. There is no hydronephrosis. There is cortical thinning in the upper pole of left kidney. There are no renal or ureteral stones. There is 7 mm low-density in the lower pole of right kidney suggesting renal cyst. Evaluation of the urinary bladder is limited by beam hardening artifacts. Stomach/Bowel: Small hiatal hernia is seen. Stomach is unremarkable. Small bowel loops are unremarkable. Cecum is higher than usual in position. There is focal circumferential wall thickening in ascending colon measuring 1.3 cm in wall thickness. Length of this lesion is 4.8 cm. Cecum and ascending colon proximal to this lesion are distended. In image 45 of series 6, there is a tubular structure with air in the lumen with blind tip in upper mid abdomen, possibly normal appendix. Moderate to large amount of stool is seen in colon. Vascular/Lymphatic: Scattered atherosclerotic plaques and calcifications are seen in aorta and its major branches. There are enlarged lymph nodes in mesentery with interval increase in size. Largest of the mesenteric nodes measures 1.6 x 0.9 cm medial to the ascending colon. Reproductive: Unremarkable. Other: There is no ascites or pneumoperitoneum. Musculoskeletal: There is previous kyphoplasty in sacrum. Degenerative changes are noted in the lumbar spine. There is previous internal fixation of intertrochanteric fracture of right femur. There is previous left hip arthroplasty. IMPRESSION: There is circumferential wall thickening in ascending colon with distention of cecum and ascending colon proximal to this  lesion. Possibility of malignant neoplasm such as carcinoma in the ascending colon is not excluded. Another possibility would be inflammatory/infectious process. Colonoscopy as clinically warranted may be considered. There are few enlarged mesenteric lymph nodes largest medial to the ascending colon. This may suggest metastatic lymphadenopathy or inflammatory or infectious process. There is no evidence of small-bowel obstruction. There is no hydronephrosis. Other findings as described in the body of the report. Electronically Signed   By: Ernie Avena M.D.   On: 10/06/2022 14:38    Pending Labs Unresulted Labs (From admission, onward)    None       Vitals/Pain Today's Vitals   10/07/22 1110 10/07/22 1215 10/07/22 1400 10/07/22 1414  BP:   97/81 97/81  Pulse:   71 76  Resp:    16  Temp:    98.6 F (37 C)  TempSrc:    Oral  SpO2:   97% 97%  Weight: 58 kg     Height: 5\' 5"  (1.651 m)     PainSc: 10-Worst pain ever 7       Isolation Precautions No active isolations  Medications Medications  HYDROmorphone (DILAUDID) injection 0.5 mg (0.5 mg Intravenous Given 10/07/22 1136)  ondansetron (ZOFRAN) injection 4 mg (4 mg Intravenous Given 10/07/22 1137)    Mobility walks     Focused Assessments Neuro Assessment Handoff:  Swallow screen pass?  N/A Cardiac Rhythm: Normal sinus rhythm       Neuro Assessment: Within Defined Limits Neuro Checks:      Has TPA been given? No If patient is a Neuro Trauma and patient is going to OR before floor call report to 4N Charge nurse: [phone] or  [phone]   R Recommendations: See Admitting Provider Note  Report given to:   Additional Notes:

## 2022-10-08 ENCOUNTER — Encounter (HOSPITAL_COMMUNITY): Payer: Self-pay | Admitting: Internal Medicine

## 2022-10-08 DIAGNOSIS — N390 Urinary tract infection, site not specified: Secondary | ICD-10-CM | POA: Diagnosis present

## 2022-10-08 DIAGNOSIS — R109 Unspecified abdominal pain: Secondary | ICD-10-CM | POA: Diagnosis not present

## 2022-10-08 DIAGNOSIS — R636 Underweight: Secondary | ICD-10-CM | POA: Diagnosis present

## 2022-10-08 DIAGNOSIS — R634 Abnormal weight loss: Secondary | ICD-10-CM

## 2022-10-08 DIAGNOSIS — Z87891 Personal history of nicotine dependence: Secondary | ICD-10-CM | POA: Diagnosis not present

## 2022-10-08 DIAGNOSIS — I2510 Atherosclerotic heart disease of native coronary artery without angina pectoris: Secondary | ICD-10-CM | POA: Diagnosis present

## 2022-10-08 DIAGNOSIS — E785 Hyperlipidemia, unspecified: Secondary | ICD-10-CM | POA: Diagnosis present

## 2022-10-08 DIAGNOSIS — Z79890 Hormone replacement therapy (postmenopausal): Secondary | ICD-10-CM | POA: Diagnosis not present

## 2022-10-08 DIAGNOSIS — R935 Abnormal findings on diagnostic imaging of other abdominal regions, including retroperitoneum: Secondary | ICD-10-CM | POA: Diagnosis not present

## 2022-10-08 DIAGNOSIS — Z66 Do not resuscitate: Secondary | ICD-10-CM | POA: Diagnosis present

## 2022-10-08 DIAGNOSIS — C187 Malignant neoplasm of sigmoid colon: Secondary | ICD-10-CM | POA: Diagnosis not present

## 2022-10-08 DIAGNOSIS — Z515 Encounter for palliative care: Secondary | ICD-10-CM | POA: Diagnosis not present

## 2022-10-08 DIAGNOSIS — K56691 Other complete intestinal obstruction: Secondary | ICD-10-CM | POA: Diagnosis not present

## 2022-10-08 DIAGNOSIS — E039 Hypothyroidism, unspecified: Secondary | ICD-10-CM | POA: Diagnosis present

## 2022-10-08 DIAGNOSIS — F419 Anxiety disorder, unspecified: Secondary | ICD-10-CM | POA: Diagnosis present

## 2022-10-08 DIAGNOSIS — Z7189 Other specified counseling: Secondary | ICD-10-CM | POA: Diagnosis not present

## 2022-10-08 DIAGNOSIS — D490 Neoplasm of unspecified behavior of digestive system: Secondary | ICD-10-CM | POA: Diagnosis not present

## 2022-10-08 DIAGNOSIS — K639 Disease of intestine, unspecified: Secondary | ICD-10-CM | POA: Diagnosis not present

## 2022-10-08 DIAGNOSIS — D509 Iron deficiency anemia, unspecified: Secondary | ICD-10-CM | POA: Diagnosis present

## 2022-10-08 DIAGNOSIS — Z1612 Extended spectrum beta lactamase (ESBL) resistance: Secondary | ICD-10-CM | POA: Diagnosis present

## 2022-10-08 DIAGNOSIS — R1084 Generalized abdominal pain: Secondary | ICD-10-CM

## 2022-10-08 DIAGNOSIS — K760 Fatty (change of) liver, not elsewhere classified: Secondary | ICD-10-CM | POA: Diagnosis present

## 2022-10-08 DIAGNOSIS — K6389 Other specified diseases of intestine: Secondary | ICD-10-CM | POA: Diagnosis not present

## 2022-10-08 DIAGNOSIS — R112 Nausea with vomiting, unspecified: Secondary | ICD-10-CM

## 2022-10-08 DIAGNOSIS — R933 Abnormal findings on diagnostic imaging of other parts of digestive tract: Secondary | ICD-10-CM | POA: Diagnosis not present

## 2022-10-08 DIAGNOSIS — K5669 Other partial intestinal obstruction: Secondary | ICD-10-CM | POA: Diagnosis not present

## 2022-10-08 DIAGNOSIS — C182 Malignant neoplasm of ascending colon: Secondary | ICD-10-CM | POA: Diagnosis present

## 2022-10-08 DIAGNOSIS — L89150 Pressure ulcer of sacral region, unstageable: Secondary | ICD-10-CM | POA: Diagnosis present

## 2022-10-08 DIAGNOSIS — D630 Anemia in neoplastic disease: Secondary | ICD-10-CM | POA: Diagnosis present

## 2022-10-08 DIAGNOSIS — Z9049 Acquired absence of other specified parts of digestive tract: Secondary | ICD-10-CM | POA: Diagnosis not present

## 2022-10-08 DIAGNOSIS — I10 Essential (primary) hypertension: Secondary | ICD-10-CM | POA: Diagnosis present

## 2022-10-08 DIAGNOSIS — K648 Other hemorrhoids: Secondary | ICD-10-CM | POA: Diagnosis not present

## 2022-10-08 DIAGNOSIS — B964 Proteus (mirabilis) (morganii) as the cause of diseases classified elsewhere: Secondary | ICD-10-CM | POA: Diagnosis present

## 2022-10-08 DIAGNOSIS — Z681 Body mass index (BMI) 19 or less, adult: Secondary | ICD-10-CM | POA: Diagnosis not present

## 2022-10-08 DIAGNOSIS — Z87440 Personal history of urinary (tract) infections: Secondary | ICD-10-CM | POA: Diagnosis not present

## 2022-10-08 DIAGNOSIS — J45909 Unspecified asthma, uncomplicated: Secondary | ICD-10-CM | POA: Diagnosis present

## 2022-10-08 DIAGNOSIS — C189 Malignant neoplasm of colon, unspecified: Secondary | ICD-10-CM | POA: Diagnosis not present

## 2022-10-08 DIAGNOSIS — Z79899 Other long term (current) drug therapy: Secondary | ICD-10-CM | POA: Diagnosis not present

## 2022-10-08 DIAGNOSIS — D122 Benign neoplasm of ascending colon: Secondary | ICD-10-CM | POA: Diagnosis not present

## 2022-10-08 DIAGNOSIS — F32A Depression, unspecified: Secondary | ICD-10-CM | POA: Diagnosis present

## 2022-10-08 DIAGNOSIS — D123 Benign neoplasm of transverse colon: Secondary | ICD-10-CM | POA: Diagnosis not present

## 2022-10-08 DIAGNOSIS — E876 Hypokalemia: Secondary | ICD-10-CM | POA: Diagnosis present

## 2022-10-08 MED ORDER — SODIUM SULFATE-MAG SULFATE-KCL 1479-225-188 MG PO TABS
12.0000 | ORAL_TABLET | Freq: Once | ORAL | Status: AC
  Administered 2022-10-08: 12 via ORAL

## 2022-10-08 MED ORDER — SODIUM CHLORIDE 0.9 % IV SOLN: INTRAVENOUS | Status: AC

## 2022-10-08 MED ORDER — SODIUM CHLORIDE 0.9 % IV SOLN: INTRAVENOUS | Status: DC

## 2022-10-08 MED ORDER — SODIUM SULFATE-MAG SULFATE-KCL 1479-225-188 MG PO TABS
12.0000 | ORAL_TABLET | Freq: Once | ORAL | Status: AC
  Administered 2022-10-09: 12 via ORAL

## 2022-10-08 NOTE — TOC Initial Note (Signed)
Transition of Care [facility]) - Initial/Assessment Note    Patient Details  Name: Kerri [name] MRN: [id number] Date of Birth: 05/30/1929  [facility]) CM/SW Contact:    Howell Rucks, RN Phone Number: 10/08/2022, 10:44 AM  Clinical Narrative:  Call to [facility] Facility,  spoke with Lanise, confirmed pt a resident and can return when medically stable, reports facility can provide transportation weekdays only (shuttle, [place]), will need to call to arrange. TOC will continue to follow.                 Expected Discharge Plan: Home/Self Care Barriers to Discharge: Continued Medical Work up   Patient Goals and CMS Choice Patient states their goals for this hospitalization and ongoing recovery are:: Return to ILF          Expected Discharge Plan and Services       Living arrangements for the past 2 months: Independent Living Facility [facility])                                      Prior Living Arrangements/Services Living arrangements for the past 2 months: Independent Living Facility [facility]) Lives with:: Self Patient language and need for interpreter reviewed:: Yes        Need for Family Participation in Patient Care: Yes (Comment) Care giver support system in place?: Yes (comment)   Criminal Activity/Legal Involvement Pertinent to Current Situation/Hospitalization: No - Comment as needed  Activities of Daily Living Home Assistive Devices/Equipment: Walker (specify type) ADL Screening (condition at time of admission) Patient's cognitive ability adequate to safely complete daily activities?: Yes Is the patient deaf or have difficulty hearing?: No Does the patient have difficulty seeing, even when wearing glasses/contacts?: No Does the patient have difficulty concentrating, remembering, or making decisions?: No Patient able to express need for assistance with ADLs?: Yes Does the patient have difficulty dressing or bathing?:  Yes Independently performs ADLs?: Yes (appropriate for developmental age) Does the patient have difficulty walking or climbing stairs?: Yes Weakness of Legs: Both Weakness of Arms/Hands: None  Permission Sought/Granted                  Emotional Assessment         Alcohol / Substance Use: Not Applicable Psych Involvement: No (comment)  Admission diagnosis:  Lesion of colon [K63.9] Pain of upper abdomen [R10.10] Patient Active Problem List   Diagnosis Date Noted   Lesion of colon 10/07/2022   Grade I diastolic dysfunction 10/07/2022   Pressure injury of skin 12/20/2021   Syncope and collapse 12/19/2021   Chronic pain disorder 12/19/2021   DNR (do not resuscitate) 12/19/2021   Folate deficiency 09/10/2021   Acute UTI (urinary tract infection) 09/09/2021   Ptosis of right eyelid 09/09/2021   AKI (acute kidney injury) (HCC) 08/15/2021   Acute metabolic encephalopathy 08/15/2021   Lactic acidosis 08/15/2021   Protein-calorie malnutrition, severe 08/12/2021   Aggression    Normocytic anemia 08/11/2021   NSVT (nonsustained ventricular tachycardia) (HCC) 08/10/2021   Elevated troponin 08/10/2021   UTI (urinary tract infection) 08/10/2021   Hip fracture (HCC) 08/09/2021   Elevated CK 08/09/2021   Dehydration 08/09/2021   Anxiety 06/17/2021   History of recurrent UTIs 07/08/2020   SOB (shortness of breath) 04/07/2020   Primary osteoarthritis of right knee 10/06/2019   GERD (gastroesophageal reflux disease) 06/06/2019   Palpitations 03/29/2019   Failed  total hip arthroplasty (HCC) 11/27/2018   Melanoma (HCC)    Dizziness 01/30/2017   Lumbar stenosis with neurogenic claudication 11/13/2013   Sebaceous cyst 11/20/2012   Squamous cell carcinoma in situ of skin of calf 04/03/2012     Hypokalemia 09/08/2011   Postop Acute blood loss anemia 09/05/2011   Osteoarthritis of hip 09/04/2011   Melanoma of lower leg (HCC) 05/03/2011   PVC's (premature ventricular contractions)  07/30/2009   Hypothyroidism 05/10/2009   Hyperlipidemia 05/10/2009   Essential hypertension 05/10/2009   Coronary atherosclerosis 05/10/2009   OA (osteoarthritis) of knee 05/10/2009   SPINAL STENOSIS 05/10/2009   Carotid bruit 05/10/2009   PCP:  Delma Officer, PA Pharmacy:   [facility] [phone] [place], [place] - [address] AT [facility] [address] & [facility] [address] [place] [address] Phone: [phone] Fax: [phone]  [facility] [address] [place], [place] - [address] [place] [address] Phone: [phone] Fax: [phone]     Social Determinants of Health (SDOH) Social History: SDOH Screenings   Food Insecurity: No Food Insecurity (10/08/2022)  Housing: Low Risk  (10/08/2022)  Transportation Needs: No Transportation Needs (10/08/2022)  Utilities: Not At Risk (10/08/2022)  Tobacco Use: Medium Risk (10/08/2022)   SDOH Interventions: Food Insecurity Interventions: Intervention Not Indicated Housing Interventions: Intervention Not Indicated Transportation Interventions: Intervention Not Indicated Utilities Interventions: Intervention Not Indicated   Readmission Risk Interventions    09/11/2021    2:25 PM  Readmission Risk Prevention Plan  Transportation Screening Complete  PCP or Specialist Appt within 3-5 Days Complete  HRI or Home Care Consult Complete  Social Work Consult for Recovery Care Planning/Counseling Complete  Palliative Care Screening Complete

## 2022-10-08 NOTE — Progress Notes (Signed)
PROGRESS NOTE  Kerri [name]  ZOX:[id number] DOB: 26-Sep-1929 DOA: 10/07/2022 PCP: Delma Officer, PA   Brief Narrative:  Patient is a 89 year old female with history of colonic polyps, asthma, basal cell carcinoma of the left forehead, coronary artery disease, depression/anxiety, diverticulosis, hyperlipidemia, fatty liver disease, hypertension, hypothyroidism, skin cancer who was brought from [facility] for further evaluation of nausea, decreased oral intake, vomiting,wight loss, black stool.  On presentation, lab work showed hemoglobin of 12.  CT abdomen/pelvis showed circumferential wall thickening of the ascending colon with distention of the cecum, few enlarged mesenteric lymph nodes.  GI consulted for further evaluation.  Plan for colonoscopy  Assessment & Plan:  Principal Problem:   Lesion of colon Active Problems:   Hypothyroidism   Hyperlipidemia   Essential hypertension   Coronary atherosclerosis   History of recurrent UTIs   Grade I diastolic dysfunction  Abnormal CT findings /suspected colonic neoplasm: Presented with decreased oral intake, vomiting, black stool, weight loss.  CT abdomen/pelvis showed circumferential wall thickening in the ascending colon with distention of cecum and ascending colon proximal to this lesion.Possible  malignant neoplasm versus inflammatory/infectious process.  No evidence of SBO. Currently on clear liquid diet.  GI planning for colonoscopy on Monday. Hemoglobin has remained stable.  Hypothyroidism: Continue Synthyroid  Hyperlipidemia: Continue rosuvastatin  Hypertension: Continue metoprolol  Coronary artery disease: No anginal symptoms.  Continue beta-blocker, statin  Grade 1 diastolic dysfunction: Follows with cardiology as an outpatient.  Currently involving  Recurrent UTI: On trimethoprim  Disposition: Lives at ILF.  Toc consulted  Low back pain: Complains of low back pain.No H/O fall.  CT abdomen/pelvis shows degenerative changes  in the lumbar spine.  Will consult PT after colonoscopy.  If she continues to have low back pain, will need further evaluation with imagings  Goals of care: Elderly patient with suspicion for colonic neoplasm.  CODE STATUS DNR.  Will consult palliative care if colonoscopy findings are suspicious for malignant         DVT prophylaxis:SCDs Start: 10/07/22 1553     Code Status: DNR  Family Communication: Discussed with daughter at bedside  Patient status:Obs  Patient is from :ILF  Anticipated discharge to:ILF  Estimated DC date: 1 to 2 days, needs full workup   Consultants: GI  Procedures:None  Antimicrobials:  Anti-infectives (From admission, onward)    Start     Dose/Rate Route Frequency Ordered Stop   10/07/22 1615  trimethoprim (TRIMPEX) tablet 100 mg        100 mg Oral Daily 10/07/22 1605         Subjective: Patient seen and examined at bedside today.  Hemodynamically stable.  Appears comfortable, lying in bed.  No new episodes of hematochezia or melena today.  Complains of some abdominal discomfort and  back pain.  No nausea or vomiting  Objective: Vitals:   10/07/22 1710 10/07/22 2116 10/08/22 0048 10/08/22 0516  BP: (!) 120/91 117/76 124/75 (!) 142/78  Pulse: 77 65 72 70  Resp: 18 17 18 18   Temp:  97.6 F (36.4 C) (!) 97.4 F (36.3 C) (!) 97.5 F (36.4 C)  TempSrc:  Oral Oral Oral  SpO2: 97% 97% 98% 98%  Weight: 54.4 kg     Height: 5\' 5"  (1.651 m)       Intake/Output Summary (Last 24 hours) at 10/08/2022 0726 Last data filed at 10/08/2022 0539 Gross per 24 hour  Intake 861.25 ml  Output 300 ml  Net 561.25 ml   [employer]  10/07/22 1110 10/07/22 1710  Weight: 58 kg 54.4 kg    Examination:  General exam: Overall comfortable, not in distress, pleasant elderly female HEENT: PERRL Respiratory system:  no wheezes or crackles  Cardiovascular system: S1 & S2 heard, RRR.  Gastrointestinal system: Abdomen is mild distended, soft and bowel  sounds present.  Some mild generalized tenderness Central nervous system: Alert and oriented Extremities: No edema, no clubbing ,no cyanosis Skin: No rashes, no ulcers,no icterus     Data Reviewed: I have personally reviewed following labs and imaging studies  CBC: Recent Labs  Lab 10/07/22 1139  WBC 8.4  NEUTROABS 5.6  HGB 12.0  HCT 37.6  MCV 84.7  PLT 284   Basic Metabolic Panel: Recent Labs  Lab 10/07/22 1139  NA 135  K 3.9  CL 102  CO2 23  GLUCOSE 107*  BUN 11  CREATININE 0.67  CALCIUM 9.1     No results found for this or any previous visit (from the past 240 hour(s)).   Radiology Studies: CT ABDOMEN PELVIS W CONTRAST  Result Date: 10/06/2022 CLINICAL DATA:  Abdominal pain, constipation EXAM: CT ABDOMEN AND PELVIS WITH CONTRAST TECHNIQUE: Multidetector CT imaging of the abdomen and pelvis was performed using the standard protocol following bolus administration of intravenous contrast. RADIATION DOSE REDUCTION: This exam was performed according to the departmental dose-optimization program which includes automated exposure control, adjustment of the mA and/or kV according to patient size and/or use of iterative reconstruction technique. CONTRAST:  ISOVUE-300 IOPAMIDOL (ISOVUE-300) INJECTION 61% COMPARISON:  Previous studies including the examination of 08/09/2021 FINDINGS: Lower chest: Small linear densities are seen in lower lung fields suggesting scarring or subsegmental atelectasis. Hepatobiliary: There are few subcentimeter low-density foci in liver with no significant interval change, possibly cysts or hemangiomas. There is no dilation of bile ducts. Gallbladder is unremarkable. Pancreas: No focal abnormalities are seen. Spleen: Unremarkable. Adrenals/Urinary Tract: Adrenals are unremarkable. There is no hydronephrosis. There is cortical thinning in the upper pole of left kidney. There are no renal or ureteral stones. There is 7 mm low-density in the lower pole of  right kidney suggesting renal cyst. Evaluation of the urinary bladder is limited by beam hardening artifacts. Stomach/Bowel: Small hiatal hernia is seen. Stomach is unremarkable. Small bowel loops are unremarkable. Cecum is higher than usual in position. There is focal circumferential wall thickening in ascending colon measuring 1.3 cm in wall thickness. Length of this lesion is 4.8 cm. Cecum and ascending colon proximal to this lesion are distended. In image 45 of series 6, there is a tubular structure with air in the lumen with blind tip in upper mid abdomen, possibly normal appendix. Moderate to large amount of stool is seen in colon. Vascular/Lymphatic: Scattered atherosclerotic plaques and calcifications are seen in aorta and its major branches. There are enlarged lymph nodes in mesentery with interval increase in size. Largest of the mesenteric nodes measures 1.6 x 0.9 cm medial to the ascending colon. Reproductive: Unremarkable. Other: There is no ascites or pneumoperitoneum. Musculoskeletal: There is previous kyphoplasty in sacrum. Degenerative changes are noted in the lumbar spine. There is previous internal fixation of intertrochanteric fracture of right femur. There is previous left hip arthroplasty. IMPRESSION: There is circumferential wall thickening in ascending colon with distention of cecum and ascending colon proximal to this lesion. Possibility of malignant neoplasm such as carcinoma in the ascending colon is not excluded. Another possibility would be inflammatory/infectious process. Colonoscopy as clinically warranted may be considered. There are few enlarged  mesenteric lymph nodes largest medial to the ascending colon. This may suggest metastatic lymphadenopathy or inflammatory or infectious process. There is no evidence of small-bowel obstruction. There is no hydronephrosis. Other findings as described in the body of the report. Electronically Signed   By: Ernie Avena M.D.   On:  10/06/2022 14:38    Scheduled Meds:  cyanocobalamin  500 mcg Oral Daily   ferrous sulfate  325 mg Oral Q breakfast   fluticasone  2 spray Each Nare Daily   gabapentin  100 mg Oral BID   levothyroxine  75 mcg Oral Q breakfast   loratadine  10 mg Oral Daily   metoprolol succinate  12.5 mg Oral Daily   rosuvastatin  10 mg Oral QHS   tapentadol  50 mg Oral BID   trimethoprim  100 mg Oral Daily   Continuous Infusions:  sodium chloride 75 mL/hr at 10/08/22 0059     LOS: 0 days   Burnadette Pop, MD [facility] P5/19/2024, 7:26 AM

## 2022-10-08 NOTE — H&P (View-Only) (Signed)
HISTORY OF PRESENT ILLNESS:  Kerri [name] is a 89 y.o. female admitted to the hospital with 3-week history of abdominal pain, intermittent nausea with vomiting, and weight loss.  Abnormal CT scan with possible mass versus inflammatory reaction in the right colon.  She has been stable without fevers.  Hemoglobin 12.0.  Comprehensive metabolic panel unremarkable.  The daughter was able to pick up Sutab prescription.  Patient states that she is feeling better after having received Compazine.  New complaints.  Small bowel movements with MiraLAX.  Watching movies on her laptop.  Daughter in room  REVIEW OF SYSTEMS:  All non-GI ROS negative. Past Medical History:  Diagnosis Date   Adenomatous colon polyp    Asthma    Basal cell carcinoma 03/13/2017   left forehead - CX3 + cautery + 5FU   CAD (coronary artery disease)    Chest CTA 10/07: 40% or less pLAD;  ETT 8/09: negative   Carotid bruit    Chronic back pain    Depression    Diverticulosis    DJD (degenerative joint disease)    Dyslipidemia    Dysrhythmia    pvcs   Fatty liver    GERD (gastroesophageal reflux disease)    HTN (hypertension)    Hypothyroidism    IBS (irritable bowel syndrome)    Internal hemorrhoid    Melanoma (HCC)    metastatic; left leg; s/p multiple excisions   Migraine    Osteoporosis    Spinal stenosis    Squamous cell carcinoma of skin 09/02/2015   mid chest - CX3 + 5FU   Squamous cell carcinoma of skin 11/07/2017   left wrist - tx p bx   Squamous cell carcinoma of skin 12/11/2017   left hand - tx p bx    Past Surgical History:  Procedure Laterality Date   BACK SURGERY     BLEPHAROPLASTY     BUNIONECTOMY WITH HAMMERTOE RECONSTRUCTION Right 01/16/2013   Procedure: RIGHT Quintella Reichert OSTEOTOMY WITH SIMPLE BUNIONECTOMY AND SECOND HAMMER TOE CORRECTION;  Surgeon: Toni Arthurs, MD;  Location: [facility];  Service: Orthopedics;  Laterality: Right;   CARPAL TUNNEL RELEASE     FEMUR IM NAIL Right  08/10/2021   Procedure: INTRAMEDULLARY (IM) NAIL FEMORAL;  Surgeon: Samson Frederic, MD;  Location: [facility];  Service: Orthopedics;  Laterality: Right;   KNEE ARTHROSCOPY Right    LUMBAR LAMINECTOMY/DECOMPRESSION MICRODISCECTOMY N/A 11/13/2013   Procedure: LUMBAR TWO-THREE,LUMBAR THREE-FOUR,LUMBAR FOUR-FIVE LAMINECTOMY AND FORAMINOTOMY;  Surgeon: Cristi Loron, MD;  Location: [facility];  Service: Neurosurgery;  Laterality: N/A;   MELANOMA EXCISION Left 09/19/2012   Procedure: WIDE EXCISION METASTATIC MELANOMA LEFT MEDIAL THIGH;  Surgeon: Liz Malady, MD;  Location: [facility];  Service: General;  Laterality: Left;   ROTATOR CUFF REPAIR     SHOULDER SURGERY     TOE SURGERY     TOTAL HIP ARTHROPLASTY  09/04/2011   Procedure: TOTAL HIP ARTHROPLASTY;  Surgeon: Loanne Drilling, MD;  Location: [facility];  Service: Orthopedics;  Laterality: Left;   TOTAL HIP REVISION Left 11/27/2018   Procedure: Left hip femoral revision;  Surgeon: Ollen Gross, MD;  Location: [facility];  Service: Orthopedics;  Laterality: Left;    TOTAL KNEE ARTHROPLASTY Right 10/06/2019   Procedure: TOTAL KNEE ARTHROPLASTY;  Surgeon: Ollen Gross, MD;  Location: [facility];  Service: Orthopedics;  Laterality: Right;     Social History Kerri [name]  reports that she quit smoking about 74 years  ago. Her smoking use included cigarettes. She has never used smokeless tobacco. She reports current alcohol use of about 2.0 standard drinks of alcohol per week. She reports that she does not use drugs.  family history includes CAD in her father and mother; Kidney disease in her child.  Allergies  Allergen Reactions   Buprenorphine Hcl Itching and Other (See Comments)    Happened awhile back- patient noted that any narcotic-based medication makes her have a "bad reaction" Other reaction(s): Other Happened a while back. Patient noted that any narcotic based medication makes her have a bad reaction. Happened a while  back. Patient noted that any narcotic based medication makes her have a bad reaction. Happened awhile back- patient noted that any narcotic-based medication makes her have a "bad reaction"   Codeine Nausea And Vomiting and Other (See Comments)    Patient gets sick to her stomach Other reaction(s): nausea   Morphine Itching and Other (See Comments)    Happened awhile back- patient noted that any narcotic-based medication makes her have a "bad reaction" Other reaction(s): itching, Other Happened a while back. Patient noted that any narcotic based medication makes her have a bad reaction. Happened awhile back- patient noted that any narcotic-based medication makes her have a "bad reaction"    Morphine And Codeine Itching and Other (See Comments)    Happened awhile back- patient noted that any narcotic-based medication makes her have a "bad reaction"   Tramadol Nausea And Vomiting and Other (See Comments)    Confusion and severe headaches, also   Adhesive [Tape] Other (See Comments)    Tears the skin    Doxycycline Hyclate Other (See Comments)    Makes the patient "ill" Other reaction(s): ill, Other Makes the patient "ill"    Hydrocodone-Acetaminophen Nausea And Vomiting    Other reaction(s): Other   Other Other (See Comments)    Other reaction(s): headache, dizzy and nausea Other reaction(s): Other   Oxycodone Hcl     Other reaction(s): Unknown   Tapentadol Other (See Comments)    Dizziness with doses higher than 50 mg twice a day Other reaction(s): Other Dizziness with doses higher than 50 mg twice a day   Tapentadol Hcl     Other reaction(s): Other   Tramadol Hcl Other (See Comments)    Other reaction(s): nausea, vomiting   Hydrocodone-Acetaminophen Nausea And Vomiting   Lactose Intolerance (Gi) Nausea Only and Other (See Comments)    GI upset    Oxycodone Nausea And Vomiting    Other reaction(s): nausea, vomiting       PHYSICAL EXAMINATION: Vital signs: BP (!) 141/67    Pulse 67   Temp (!) 97.5 F (36.4 C) (Oral)   Resp 18   Ht 5\' 5"  (1.651 m)   Wt 54.4 kg   SpO2 98%   BMI 19.96 kg/m   Constitutional: Elderly but generally well-appearing, no acute distress Psychiatric: alert and oriented x3, cooperative Eyes: extraocular movements intact, anicteric, conjunctiva pink Mouth: oral pharynx moist, no lesions Neck: supple no lymphadenopathy Cardiovascular: heart regular rate and rhythm, systolic murmur Lungs: clear to auscultation bilaterally Abdomen: soft, mildly distended and tender, no obvious ascites, no peritoneal signs, normal bowel sounds, no organomegaly Rectal: Omitted Extremities: no clubbing, cyanosis, or lower extremity edema bilaterally Skin: no lesions on visible extremities Neuro: No focal deficits.  Cranial nerves intact  ASSESSMENT:  1.  3+ week history of abdominal pain with intermittent nausea and vomiting.  Weight loss.  Abnormal CT scan.  Rule out cancer   PLAN:  1.  Colon prep today for colonoscopy tomorrow with Dr. Lavon Paganini tomorrow afternoon at 2 PM.The nature of the procedure, as well as the risks, benefits, and alternatives were carefully and thoroughly reviewed with the patient. Ample time for discussion and questions allowed. The patient understood, was satisfied, and agreed to proceed.  Wilhemina Bonito. Eda Keys., M.D. [facility] Division of Gastroenterology

## 2022-10-08 NOTE — Plan of Care (Signed)

## 2022-10-08 NOTE — Progress Notes (Signed)
Kerri [name] refuses to wear SCD's and refuses to have a floor mat.She is scheduled to have a Colonoscopy 5-20, 14:00 Patient should be NPO after midnight

## 2022-10-08 NOTE — Progress Notes (Signed)
HISTORY OF PRESENT ILLNESS:  Kerri [name] is a 89 y.o. female admitted to the hospital with 3-week history of abdominal pain, intermittent nausea with vomiting, and weight loss.  Abnormal CT scan with possible mass versus inflammatory reaction in the right colon.  She has been stable without fevers.  Hemoglobin 12.0.  Comprehensive metabolic panel unremarkable.  The daughter was able to pick up Sutab prescription.  Patient states that she is feeling better after having received Compazine.  New complaints.  Small bowel movements with MiraLAX.  Watching movies on her laptop.  Daughter in room  REVIEW OF SYSTEMS:  All non-GI ROS negative. Past Medical History:  Diagnosis Date   Adenomatous colon polyp    Asthma    Basal cell carcinoma 03/13/2017   left forehead - CX3 + cautery + 5FU   CAD (coronary artery disease)    Chest CTA 10/07: 40% or less pLAD;  ETT 8/09: negative   Carotid bruit    Chronic back pain    Depression    Diverticulosis    DJD (degenerative joint disease)    Dyslipidemia    Dysrhythmia    pvcs   Fatty liver    GERD (gastroesophageal reflux disease)    HTN (hypertension)    Hypothyroidism    IBS (irritable bowel syndrome)    Internal hemorrhoid    Melanoma (HCC)    metastatic; left leg; s/p multiple excisions   Migraine    Osteoporosis    Spinal stenosis    Squamous cell carcinoma of skin 09/02/2015   mid chest - CX3 + 5FU   Squamous cell carcinoma of skin 11/07/2017   left wrist - tx p bx   Squamous cell carcinoma of skin 12/11/2017   left hand - tx p bx    Past Surgical History:  Procedure Laterality Date   BACK SURGERY     BLEPHAROPLASTY     BUNIONECTOMY WITH HAMMERTOE RECONSTRUCTION Right 01/16/2013   Procedure: RIGHT AIKEN OSTEOTOMY WITH SIMPLE BUNIONECTOMY AND SECOND HAMMER TOE CORRECTION;  Surgeon: Hilliary Jock Hewitt, MD;  Location: [facility];  Service: Orthopedics;  Laterality: Right;   CARPAL TUNNEL RELEASE     FEMUR IM NAIL Right  08/10/2021   Procedure: INTRAMEDULLARY (IM) NAIL FEMORAL;  Surgeon: Swinteck, Brian, MD;  Location: [facility];  Service: Orthopedics;  Laterality: Right;   KNEE ARTHROSCOPY Right    LUMBAR LAMINECTOMY/DECOMPRESSION MICRODISCECTOMY N/A 11/13/2013   Procedure: LUMBAR TWO-THREE,LUMBAR THREE-FOUR,LUMBAR FOUR-FIVE LAMINECTOMY AND FORAMINOTOMY;  Surgeon: Jeffrey D Jenkins, MD;  Location: [facility];  Service: Neurosurgery;  Laterality: N/A;   MELANOMA EXCISION Left 09/19/2012   Procedure: WIDE EXCISION METASTATIC MELANOMA LEFT MEDIAL THIGH;  Surgeon: Burke E Thompson, MD;  Location: [facility];  Service: General;  Laterality: Left;   ROTATOR CUFF REPAIR     SHOULDER SURGERY     TOE SURGERY     TOTAL HIP ARTHROPLASTY  09/04/2011   Procedure: TOTAL HIP ARTHROPLASTY;  Surgeon: Frank V Aluisio, MD;  Location: [facility];  Service: Orthopedics;  Laterality: Left;   TOTAL HIP REVISION Left 11/27/2018   Procedure: Left hip femoral revision;  Surgeon: Aluisio, Frank, MD;  Location: [facility];  Service: Orthopedics;  Laterality: Left;  120min   TOTAL KNEE ARTHROPLASTY Right 10/06/2019   Procedure: TOTAL KNEE ARTHROPLASTY;  Surgeon: Aluisio, Frank, MD;  Location: [facility];  Service: Orthopedics;  Laterality: Right;  50min    Social History Chanler Beauregard  reports that she quit smoking about 74 years   ago. Her smoking use included cigarettes. She has never used smokeless tobacco. She reports current alcohol use of about 2.0 standard drinks of alcohol per week. She reports that she does not use drugs.  family history includes CAD in her father and mother; Kidney disease in her child.  Allergies  Allergen Reactions   Buprenorphine Hcl Itching and Other (See Comments)    Happened awhile back- patient noted that any narcotic-based medication makes her have a "bad reaction" Other reaction(s): Other Happened a while back. Patient noted that any narcotic based medication makes her have a bad reaction. Happened a while  back. Patient noted that any narcotic based medication makes her have a bad reaction. Happened awhile back- patient noted that any narcotic-based medication makes her have a "bad reaction"   Codeine Nausea And Vomiting and Other (See Comments)    Patient gets sick to her stomach Other reaction(s): nausea   Morphine Itching and Other (See Comments)    Happened awhile back- patient noted that any narcotic-based medication makes her have a "bad reaction" Other reaction(s): itching, Other Happened a while back. Patient noted that any narcotic based medication makes her have a bad reaction. Happened awhile back- patient noted that any narcotic-based medication makes her have a "bad reaction"    Morphine And Codeine Itching and Other (See Comments)    Happened awhile back- patient noted that any narcotic-based medication makes her have a "bad reaction"   Tramadol Nausea And Vomiting and Other (See Comments)    Confusion and severe headaches, also   Adhesive [Tape] Other (See Comments)    Tears the skin    Doxycycline Hyclate Other (See Comments)    Makes the patient "ill" Other reaction(s): ill, Other Makes the patient "ill"    Hydrocodone-Acetaminophen Nausea And Vomiting    Other reaction(s): Other   Other Other (See Comments)    Other reaction(s): headache, dizzy and nausea Other reaction(s): Other   Oxycodone Hcl     Other reaction(s): Unknown   Tapentadol Other (See Comments)    Dizziness with doses higher than 50 mg twice a day Other reaction(s): Other Dizziness with doses higher than 50 mg twice a day   Tapentadol Hcl     Other reaction(s): Other   Tramadol Hcl Other (See Comments)    Other reaction(s): nausea, vomiting   Hydrocodone-Acetaminophen Nausea And Vomiting   Lactose Intolerance (Gi) Nausea Only and Other (See Comments)    GI upset    Oxycodone Nausea And Vomiting    Other reaction(s): nausea, vomiting       PHYSICAL EXAMINATION: Vital signs: BP (!) 141/67    Pulse 67   Temp (!) 97.5 F (36.4 C) (Oral)   Resp 18   Ht 5' 5" (1.651 m)   Wt 54.4 kg   SpO2 98%   BMI 19.96 kg/m   Constitutional: Elderly but generally well-appearing, no acute distress Psychiatric: alert and oriented x3, cooperative Eyes: extraocular movements intact, anicteric, conjunctiva pink Mouth: oral pharynx moist, no lesions Neck: supple no lymphadenopathy Cardiovascular: heart regular rate and rhythm, systolic murmur Lungs: clear to auscultation bilaterally Abdomen: soft, mildly distended and tender, no obvious ascites, no peritoneal signs, normal bowel sounds, no organomegaly Rectal: Omitted Extremities: no clubbing, cyanosis, or lower extremity edema bilaterally Skin: no lesions on visible extremities Neuro: No focal deficits.  Cranial nerves intact  ASSESSMENT:  1.  3+ week history of abdominal pain with intermittent nausea and vomiting.  Weight loss.  Abnormal CT scan.    Rule out cancer   PLAN:  1.  Colon prep today for colonoscopy tomorrow with Dr. Nandigam tomorrow afternoon at 2 PM.The nature of the procedure, as well as the risks, benefits, and alternatives were carefully and thoroughly reviewed with the patient. Ample time for discussion and questions allowed. The patient understood, was satisfied, and agreed to proceed.  Jazleen Robeck N. Leonette Tischer, Jr., M.D. [facility] Division of Gastroenterology      

## 2022-10-09 ENCOUNTER — Encounter (HOSPITAL_COMMUNITY)
Admission: EM | Disposition: A | Discharge status: Skilled Nursing Facility | Payer: Self-pay | Source: Skilled Nursing Facility | Attending: Internal Medicine

## 2022-10-09 ENCOUNTER — Encounter (HOSPITAL_COMMUNITY): Payer: Self-pay

## 2022-10-09 ENCOUNTER — Inpatient Hospital Stay (HOSPITAL_COMMUNITY): Payer: Medicare Other | Admitting: Anesthesiology

## 2022-10-09 DIAGNOSIS — K648 Other hemorrhoids: Secondary | ICD-10-CM | POA: Diagnosis not present

## 2022-10-09 DIAGNOSIS — Z87891 Personal history of nicotine dependence: Secondary | ICD-10-CM | POA: Diagnosis not present

## 2022-10-09 DIAGNOSIS — K56691 Other complete intestinal obstruction: Secondary | ICD-10-CM

## 2022-10-09 DIAGNOSIS — K639 Disease of intestine, unspecified: Secondary | ICD-10-CM | POA: Diagnosis not present

## 2022-10-09 DIAGNOSIS — D490 Neoplasm of unspecified behavior of digestive system: Secondary | ICD-10-CM

## 2022-10-09 DIAGNOSIS — R933 Abnormal findings on diagnostic imaging of other parts of digestive tract: Secondary | ICD-10-CM

## 2022-10-09 DIAGNOSIS — K5669 Other partial intestinal obstruction: Secondary | ICD-10-CM

## 2022-10-09 DIAGNOSIS — D123 Benign neoplasm of transverse colon: Secondary | ICD-10-CM | POA: Diagnosis not present

## 2022-10-09 DIAGNOSIS — R109 Unspecified abdominal pain: Secondary | ICD-10-CM

## 2022-10-09 DIAGNOSIS — D126 Benign neoplasm of colon, unspecified: Secondary | ICD-10-CM

## 2022-10-09 DIAGNOSIS — C187 Malignant neoplasm of sigmoid colon: Secondary | ICD-10-CM

## 2022-10-09 DIAGNOSIS — D630 Anemia in neoplastic disease: Secondary | ICD-10-CM

## 2022-10-09 DIAGNOSIS — D122 Benign neoplasm of ascending colon: Secondary | ICD-10-CM

## 2022-10-09 DIAGNOSIS — I10 Essential (primary) hypertension: Secondary | ICD-10-CM

## 2022-10-09 DIAGNOSIS — K6389 Other specified diseases of intestine: Secondary | ICD-10-CM

## 2022-10-09 HISTORY — PX: BIOPSY: SHX5522

## 2022-10-09 HISTORY — PX: COLONOSCOPY WITH PROPOFOL: SHX5780

## 2022-10-09 HISTORY — PX: POLYPECTOMY: SHX5525

## 2022-10-09 HISTORY — PX: SUBMUCOSAL TATTOO INJECTION: SHX6856

## 2022-10-09 LAB — CBC
HCT: 33.9 % — ABNORMAL LOW (ref 36.0–46.0)
Hemoglobin: 10.4 g/dL — ABNORMAL LOW (ref 12.0–15.0)
MCH: 26.8 pg (ref 26.0–34.0)
MCHC: 30.7 g/dL (ref 30.0–36.0)
MCV: 87.4 fL (ref 80.0–100.0)
Platelets: 220 10*3/uL (ref 150–400)
RBC: 3.88 MIL/uL (ref 3.87–5.11)
RDW: 19.7 % — ABNORMAL HIGH (ref 11.5–15.5)
WBC: 5.7 10*3/uL (ref 4.0–10.5)
nRBC: 0 % (ref 0.0–0.2)

## 2022-10-09 LAB — HEMOGLOBIN A1C
Hgb A1c MFr Bld: 5.3 % (ref 4.8–5.6)
Mean Plasma Glucose: 105.41 mg/dL

## 2022-10-09 LAB — BASIC METABOLIC PANEL
Anion gap: 8 (ref 5–15)
BUN: 13 mg/dL (ref 8–23)
CO2: 25 mmol/L (ref 22–32)
Calcium: 8.4 mg/dL — ABNORMAL LOW (ref 8.9–10.3)
Chloride: 106 mmol/L (ref 98–111)
Creatinine, Ser: 0.68 mg/dL (ref 0.44–1.00)
GFR, Estimated: >60 mL/min (ref >60)
Glucose, Bld: 97 mg/dL (ref 70–99)
Potassium: 3.4 mmol/L — ABNORMAL LOW (ref 3.5–5.1)
Sodium: 139 mmol/L (ref 135–145)

## 2022-10-09 SURGERY — COLONOSCOPY WITH PROPOFOL
Anesthesia: Monitor Anesthesia Care

## 2022-10-09 MED ORDER — POTASSIUM CHLORIDE 10 MEQ/100ML IV SOLN
10.0000 meq | INTRAVENOUS | Status: AC
  Administered 2022-10-09 (×3): 10 meq via INTRAVENOUS
  Filled 2022-10-09 (×3): qty 100

## 2022-10-09 MED ORDER — PROPOFOL 10 MG/ML IV BOLUS
INTRAVENOUS | Status: DC | PRN
  Administered 2022-10-09 (×2): 10 mg via INTRAVENOUS

## 2022-10-09 MED ORDER — CHLORHEXIDINE GLUCONATE CLOTH 2 % EX PADS
6.0000 | MEDICATED_PAD | Freq: Once | CUTANEOUS | Status: AC
  Administered 2022-10-09: 6 via TOPICAL

## 2022-10-09 MED ORDER — ACETAMINOPHEN 500 MG PO TABS
1000.0000 mg | ORAL_TABLET | ORAL | Status: AC
  Administered 2022-10-10: 1000 mg via ORAL
  Filled 2022-10-09: qty 2

## 2022-10-09 MED ORDER — SODIUM CHLORIDE 0.9 % IV SOLN
2.0000 g | INTRAVENOUS | Status: AC
  Administered 2022-10-10: 2 g via INTRAVENOUS
  Filled 2022-10-09: qty 2

## 2022-10-09 MED ORDER — SPOT INK MARKER SYRINGE KIT
PACK | SUBMUCOSAL | Status: DC | PRN
  Administered 2022-10-09: 5 mL via SUBMUCOSAL

## 2022-10-09 MED ORDER — ENSURE PRE-SURGERY PO LIQD
296.0000 mL | Freq: Once | ORAL | Status: DC
  Filled 2022-10-09: qty 296

## 2022-10-09 MED ORDER — ALVIMOPAN 12 MG PO CAPS
12.0000 mg | ORAL_CAPSULE | ORAL | Status: AC
  Administered 2022-10-10: 12 mg via ORAL
  Filled 2022-10-09: qty 1

## 2022-10-09 MED ORDER — PROPOFOL 500 MG/50ML IV EMUL
INTRAVENOUS | Status: DC | PRN
  Administered 2022-10-09: 75 ug/kg/min via INTRAVENOUS

## 2022-10-09 MED ORDER — CHLORHEXIDINE GLUCONATE CLOTH 2 % EX PADS
6.0000 | MEDICATED_PAD | Freq: Once | CUTANEOUS | Status: AC
  Administered 2022-10-10: 6 via TOPICAL

## 2022-10-09 MED ORDER — LACTATED RINGERS IV SOLN: INTRAVENOUS | Status: DC

## 2022-10-09 MED ORDER — LIDOCAINE 2% (20 MG/ML) 5 ML SYRINGE
INTRAMUSCULAR | Status: DC | PRN
  Administered 2022-10-09: 50 mg via INTRAVENOUS

## 2022-10-09 MED ORDER — ENSURE PRE-SURGERY PO LIQD
592.0000 mL | Freq: Once | ORAL | Status: DC
  Filled 2022-10-09: qty 592

## 2022-10-09 SURGICAL SUPPLY — 22 items

## 2022-10-09 NOTE — Consult Note (Signed)
Hinton Lovely 22-Mar-1930  [id number].    Requesting MD: Adela Lank, MD Chief Complaint/Reason for Consult: cecal mass  HPI:  Kerri [name] is a 89 y/o F with PMH hypothyroidism, CAD, HLD, HTN, skin cancer, CVA, IMN hip 07/2021 and chronic back pain who presents from a SNF with abdominal pain, weight loss, and nausea/vomiting. Reports 2 weeks of severe pain. Denies bright red blood per rectum. States her last surgery was a nail in her hip last year. Her [place] [facility] [place]  ROS: Review of Systems  Constitutional:  Positive for malaise/fatigue and weight loss.  Gastrointestinal:  Positive for abdominal pain, constipation, nausea and vomiting.  Musculoskeletal:  Positive for back pain.  All other systems reviewed and are negative.   Family History  Problem Relation Age of Onset   CAD Mother    CAD Father    Kidney disease Child     Past Medical History:  Diagnosis Date   Adenomatous colon polyp    Asthma    Basal cell carcinoma 03/13/2017   left forehead - CX3 + cautery + 5FU   CAD (coronary artery disease)    Chest CTA 10/07: 40% or less pLAD;  ETT 8/09: negative   Carotid bruit    Chronic back pain    Depression    Diverticulosis    DJD (degenerative joint disease)    Dyslipidemia    Dysrhythmia    pvcs   Fatty liver    GERD (gastroesophageal reflux disease)    HTN (hypertension)    Hypothyroidism    IBS (irritable bowel syndrome)    Internal hemorrhoid    Melanoma (HCC)    metastatic; left leg; s/p multiple excisions   Migraine    Osteoporosis    Spinal stenosis    Squamous cell carcinoma of skin 09/02/2015   mid chest - CX3 + 5FU   Squamous cell carcinoma of skin 11/07/2017   left wrist - tx p bx   Squamous cell carcinoma of skin 12/11/2017   left hand - tx p bx    Past Surgical History:  Procedure Laterality Date   BACK SURGERY     BLEPHAROPLASTY     BUNIONECTOMY WITH HAMMERTOE RECONSTRUCTION Right 01/16/2013   Procedure: RIGHT  Quintella Reichert OSTEOTOMY WITH SIMPLE BUNIONECTOMY AND SECOND HAMMER TOE CORRECTION;  Surgeon: Toni Arthurs, MD;  Location: [facility];  Service: Orthopedics;  Laterality: Right;   CARPAL TUNNEL RELEASE     FEMUR IM NAIL Right 08/10/2021   Procedure: INTRAMEDULLARY (IM) NAIL FEMORAL;  Surgeon: Samson Frederic, MD;  Location: [facility];  Service: Orthopedics;  Laterality: Right;   KNEE ARTHROSCOPY Right    LUMBAR LAMINECTOMY/DECOMPRESSION MICRODISCECTOMY N/A 11/13/2013   Procedure: LUMBAR TWO-THREE,LUMBAR THREE-FOUR,LUMBAR FOUR-FIVE LAMINECTOMY AND FORAMINOTOMY;  Surgeon: Cristi Loron, MD;  Location: [facility];  Service: Neurosurgery;  Laterality: N/A;   MELANOMA EXCISION Left 09/19/2012   Procedure: WIDE EXCISION METASTATIC MELANOMA LEFT MEDIAL THIGH;  Surgeon: Liz Malady, MD;  Location: [facility];  Service: General;  Laterality: Left;   ROTATOR CUFF REPAIR     SHOULDER SURGERY     TOE SURGERY     TOTAL HIP ARTHROPLASTY  09/04/2011   Procedure: TOTAL HIP ARTHROPLASTY;  Surgeon: Loanne Drilling, MD;  Location: [facility];  Service: Orthopedics;  Laterality: Left;   TOTAL HIP REVISION Left 11/27/2018   Procedure: Left hip femoral revision;  Surgeon: Ollen Gross, MD;  Location: [facility];  Service:  Orthopedics;  Laterality: Left;    TOTAL KNEE ARTHROPLASTY Right 10/06/2019   Procedure: TOTAL KNEE ARTHROPLASTY;  Surgeon: Ollen Gross, MD;  Location: [facility];  Service: Orthopedics;  Laterality: Right;     Social History:  reports that she quit smoking about 74 years ago. Her smoking use included cigarettes. She has never used smokeless tobacco. She reports current alcohol use of about 2.0 standard drinks of alcohol per week. She reports that she does not use drugs.  Allergies:  Allergies  Allergen Reactions   Buprenorphine Hcl Itching and Other (See Comments)    Happened awhile back- patient noted that any narcotic-based medication makes her have a "bad reaction"    Codeine Nausea And Vomiting and Other (See Comments)    Patient gets sick to her stomach Other reaction(s): nausea   Morphine Itching and Other (See Comments)    Happened awhile back- patient noted that any narcotic-based medication makes her have a "bad reaction" Other reaction(s): itching   Tramadol Nausea And Vomiting and Other (See Comments)    Confusion and severe headaches, also   Adhesive [Tape] Other (See Comments)    Tears the skin    Doxycycline Hyclate Other (See Comments)    Makes the patient "ill"   Hydrocodone-Acetaminophen Nausea And Vomiting    Other reaction(s): Other   Other Other (See Comments)    Other reaction(s): headache, dizzy and nausea Other reaction(s): Other   Oxycodone Hcl     Other reaction(s): Unknown   Tapentadol Other (See Comments)    Dizziness with doses higher than 50 mg twice a day Other reaction(s): Other Dizziness with doses higher than 50 mg twice a day   Lactose Intolerance (Gi) Nausea Only and Other (See Comments)    GI upset    Oxycodone Nausea And Vomiting    Other reaction(s): nausea, vomiting    Medications Prior to Admission  Medication Sig Dispense Refill   alendronate (FOSAMAX) 70 MG tablet Take 70 mg by mouth every Sunday.     Biotin 5 MG CAPS Take 5 mg by mouth daily.     Cholecalciferol (VITAMIN D3) 50 MCG (2000 UT) TABS Take 2,000 Units by mouth daily.     ferrous sulfate 325 (65 FE) MG tablet Take 1 tablet (325 mg total) by mouth daily with breakfast.  3   fluticasone (FLONASE) 50 MCG/ACT nasal spray Place 2 sprays into both nostrils every morning.      gabapentin (NEURONTIN) 100 MG capsule Take 100 mg by mouth 2 (two) times daily.     levothyroxine (SYNTHROID, LEVOTHROID) 75 MCG tablet Take 75 mcg by mouth daily with breakfast.     loratadine (CLARITIN) 10 MG tablet Take 10 mg by mouth daily.     metoprolol succinate (TOPROL-XL) 25 MG 24 hr tablet Take 0.5 tablets (12.5 mg total) by mouth daily. 30 tablet 0   NUCYNTA  50 MG tablet Take 50 mg by mouth 2 (two) times daily.     rosuvastatin (CRESTOR) 10 MG tablet Take 10 mg by mouth at bedtime.     trimethoprim (TRIMPEX) 100 MG tablet Take 100 mg by mouth daily.     vitamin B-12 (CYANOCOBALAMIN) 500 MCG tablet Take 500 mcg by mouth daily.     calcium carbonate (OS-CAL) 1250 (500 Ca) MG chewable tablet Chew 2 tablets by mouth daily. (Patient not taking: Reported on 10/07/2022)     clobetasol (OLUX) 0.05 % topical foam Apply topically 2 (two) times daily. (Patient not taking:  Reported on 10/07/2022) 50 g 4   collagenase (SANTYL) 250 UNIT/GM ointment Apply topically daily. Apply to sacral Unstageable Pressure injury in a 1/8 inch layer once daily and PRN soiling of dressing indivating replacement (Patient not taking: Reported on 10/07/2022) 15 g 0   Flurandrenolide (CORDRAN) 4 MCG/SQCM TAPE Apply 1 each topically daily as needed (eczema dermatitis). (Patient not taking: Reported on 10/07/2022)     folic acid (FOLVITE) 1 MG tablet Take 1 tablet (1 mg total) by mouth daily. (Patient not taking: Reported on 10/07/2022) 30 tablet 0   Sodium Sulfate-Mag Sulfate-KCl (SUTAB) [phone] MG TABS Use as directed for colonoscopy. MANUFACTURER CODES!! BIN: [id number] PCN: CN GROUP: [id number] MEMBER ID: [id number];VHQ AS SECONDARY INSURANCE ;NO PRIOR AUTHORIZATION 24 tablet 0     Physical Exam: Blood pressure (!) 176/65, pulse (!) 52, temperature (!) 97.3 F (36.3 C), temperature source Temporal, resp. rate 14, height 5\' 5"  (1.651 m), weight 54.4 kg, SpO2 97 %. General: Pleasant white female laying on hospital bed, appears stated age, NAD. HEENT: head -normocephalic, atraumatic; Eyes: PERRLA, no conjunctival injection Neck- Trachea is midline CV- RRR, normal S1/S2, no M/R/G, no lower extremity edema.  Pulm- breathing is non-labored ORA Abd- soft, nontender, mild upper abdominal distention, no hernias GU- deferred  MSK- UE/LE symmetrical, no cyanosis, clubbing, or edema. Neuro- CN  II-XII grossly in tact, no paresthesias. Psych- Alert and Oriented x3 with appropriate affect Skin: warm and dry, no rashes or lesions   Results for orders placed or performed during the hospital encounter of 10/07/22 (from the past 48 hour(s))  CBC     Status: Abnormal   Collection Time: 10/09/22  5:34 AM  Result Value Ref Range   WBC 5.7 4.0 - 10.5 K/uL   RBC 3.88 3.87 - 5.11 MIL/uL   Hemoglobin 10.4 (L) 12.0 - 15.0 g/dL   HCT 46.9 (L) 62.9 - 52.8 %   MCV 87.4 80.0 - 100.0 fL   MCH 26.8 26.0 - 34.0 pg   MCHC 30.7 30.0 - 36.0 g/dL   RDW 41.3 (H) 24.4 - 01.0 %   Platelets 220 150 - 400 K/uL   nRBC 0.0 0.0 - 0.2 %    Comment: Performed at [facility], [address] [place], [place] [address]  Basic metabolic panel     Status: Abnormal   Collection Time: 10/09/22  5:34 AM  Result Value Ref Range   Sodium 139 135 - 145 mmol/L   Potassium 3.4 (L) 3.5 - 5.1 mmol/L   Chloride 106 98 - 111 mmol/L   CO2 25 22 - 32 mmol/L   Glucose, Bld 97 70 - 99 mg/dL    Comment: Glucose reference range applies only to samples taken after fasting for at least 8 hours.   BUN 13 8 - 23 mg/dL   Creatinine, Ser 6.64 0.44 - 1.00 mg/dL   Calcium 8.4 (L) 8.9 - 10.3 mg/dL   GFR, Estimated >40 >34 mL/min    Comment: (NOTE) Calculated using the CKD-EPI Creatinine Equation (2021)    Anion gap 8 5 - 15    Comment: Performed at [facility], [address], [place], [place] [address]   No results found.    Assessment/Plan Partially obstructing ascending colon mass, concerning for primary colon cancer  - afebrile, VSS - allow CLD, NPO after MN for possible laparoscopic assisted partial colectomy  - path pending, CT chest pending, CEA pending    FEN - CLD, NPO MN VTE -  SCD"s  ID - cefotan on call to OR Admit - TRH service     I reviewed nursing notes, Consultant gastroenterology notes, hospitalist notes, last 24 h vitals and pain scores, last 48 h intake and  output, last 24 h labs and trends, and last 24 h imaging results.  Adam Phenix, [facility] 10/09/2022, 3:24 PM Please see Amion for pager number during day hours 7:00am-4:30pm or 7:00am -11:30am on weekends

## 2022-10-09 NOTE — Progress Notes (Signed)
PT Cancellation Note  Patient Details Name: Sylvi Udo MRN: [id number] DOB: 10/04/1929   Cancelled Treatment:    Reason Eval/Treat Not Completed: Patient at procedure or test/unavailable  Blanchard Kelch PT Acute Rehabilitation Services Office [phone] Weekend pager-[phone]  Rada Hay 10/09/2022, 2:29 PM

## 2022-10-09 NOTE — Progress Notes (Signed)
Mobility Specialist - Progress Note   10/09/22 1140  Mobility  Activity Ambulated with assistance in hallway  Level of Assistance Standby assist, set-up cues, supervision of patient - no hands on  Assistive Device Front wheel walker  Distance Ambulated (ft) 250 ft  Activity Response Tolerated well  Mobility Referral Yes  $Mobility charge 1 Mobility  Mobility Specialist Start Time (ACUTE ONLY) 1123  Mobility Specialist Stop Time (ACUTE ONLY) 1138  Mobility Specialist Time Calculation (min) (ACUTE ONLY) 15 min   Pt received in bed and agreeable to mobility. No complaints during session. Pt to bed after session with all needs met. Bed alarm on.  [facility]

## 2022-10-09 NOTE — Anesthesia Postprocedure Evaluation (Signed)
Anesthesia Post Note  Patient: Kerri [name]  Procedure(s) Performed: COLONOSCOPY WITH PROPOFOL BIOPSY SUBMUCOSAL TATTOO INJECTION POLYPECTOMY     Patient location during evaluation: PACU Anesthesia Type: MAC Level of consciousness: awake and alert Pain management: pain level controlled Vital Signs Assessment: post-procedure vital signs reviewed and stable Respiratory status: spontaneous breathing, nonlabored ventilation and respiratory function stable Cardiovascular status: stable and blood pressure returned to baseline Anesthetic complications: no   No notable events documented.  Last Vitals:  Vitals:   10/09/22 1507 10/09/22 1510  BP:  (!) 142/56  Pulse:    Resp:  20  Temp:    SpO2: 93%     Last Pain:  Vitals:   10/09/22 1500  TempSrc: Temporal  PainSc:                  [name]

## 2022-10-09 NOTE — Op Note (Signed)
[facility] Patient Name: Kerri [name] Procedure Date: 10/09/2022 MRN: [id number] Attending MD: Willaim Rayas. Adela Lank , MD, [id number] Date of Birth: 10-07-1929 CSN: [id number] Age: 89 Admit Type: Inpatient Procedure:                Colonoscopy Indications:              Abnormal CT of the GI tract - changes concerning                            for possible mass lesion. Abdominal pain, weight                            loss Providers:                Viviann Spare P. Adela Lank, MD, Martha Clan, RN,                            Marge Duncans, RN, Melany Guernsey, Technician,                            Kym Groom, CRNA Referring MD:              Medicines:                Monitored Anesthesia Care Complications:            No immediate complications. Estimated blood loss:                            Minimal. Estimated Blood Loss:     Estimated blood loss was minimal. Procedure:                Pre-Anesthesia Assessment:                           - Prior to the procedure, a History and Physical                            was performed, and patient medications and                            allergies were reviewed. The patient's tolerance of                            previous anesthesia was also reviewed. The risks                            and benefits of the procedure and the sedation                            options and risks were discussed with the patient.                            All questions were answered, and informed consent                            was obtained. Prior  Anticoagulants: The patient has                            taken no anticoagulant or antiplatelet agents. ASA                            Grade Assessment: III - A patient with severe                            systemic disease. After reviewing the risks and                            benefits, the patient was deemed in satisfactory                            condition to undergo the procedure.                            After obtaining informed consent, the colonoscope                            was passed under direct vision. Throughout the                            procedure, the patient's blood pressure, pulse, and                            oxygen saturations were monitored continuously. The                            PCF-HQ190L ([id number]) Olympus colonoscope was                            introduced through the anus and advanced to the the                            ascending colon to examine a mass. This was the                            intended extent. The colonoscopy was performed                            without difficulty. The patient tolerated the                            procedure well. The quality of the bowel                            preparation was adequate. The rectum was                            photographed. Scope In: 2:15:20 PM Scope Out: 2:39:32 PM Total Procedure Duration: 0 hours 24 minutes 12 seconds  Findings:      The perianal and digital  rectal examinations were normal.      An ulcerated partially obstructing large mass was found in the ascending       colon. The mass was circumferential. Lumen was narrowed and did not       attempt to traverse it, did not think the colonoscope would safely       traverse it. Biopsies were taken with a cold forceps for histology. Area       was distal to the mass was tattooed with an injection of Spot (carbon       black).      Two flat polyps were found in the transverse colon. The polyps were 3 to       10 mm in size. These polyps were removed with a cold snare. Resection       and retrieval were complete.      Internal hemorrhoids were found during retroflexion.      The exam was otherwise without abnormality. Of note, cecal intubation       done mostly with water immersion and minimal air used for this exam to       reduce risk for air retention given CT findings. Impression:               - Likely  malignant partially obstructing tumor in                            the ascending colon. Biopsied. Tattooed.                           - Two 3 to 10 mm polyps in the transverse colon,                            removed with a cold snare. Resected and retrieved.                           - Internal hemorrhoids.                           - The examination was otherwise normal.                           Unfortunately there is a large mass causing partial                            obstruction of the right colon. Patient has voiced                            her concerns about this and measures she may not                            want done if she has cancer (i.e. surgery or                            chemotherapy). Will need to discuss goals of care                            with patient and family  to see how aggresive she                            wishes to be with treatment. Moderate Sedation:      No moderate sedation, case performed with MAC Recommendation:           - Return patient to hospital ward for ongoing care.                           - Advance diet as tolerated.                           - Continue present medications.                           - Miralax BID to keep stools loose to prevent                            obstruction                           - Await pathology results.                           - CT chest and CEA level for staging                           - Surgical consultation to discuss this option with                            the patient / family and what that would entail.                            She will likely become obstructed with worsening                            symptoms without surgery                           - Will discuss with primary team if they wish to                            have palliative care consultation to discuss goals                            of care Procedure Code(s):        --- Professional ---                            [id number], 52, Colonoscopy, flexible; with removal of                            tumor(s), polyp(s), or other lesion(s) by snare                            technique  40981, 59,52, Colonoscopy, flexible; with biopsy,                            single or multiple                           45381, 52, Colonoscopy, flexible; with directed                            submucosal injection(s), any substance Diagnosis Code(s):        --- Professional ---                           D49.0, Neoplasm of unspecified behavior of                            digestive system                           K56.690, Other partial intestinal obstruction                           D12.3, Benign neoplasm of transverse colon (hepatic                            flexure or splenic flexure)                           K64.8, Other hemorrhoids                           R93.3, Abnormal findings on diagnostic imaging of                            other parts of digestive tract CPT copyright 2022 American Medical Association. All rights reserved. The codes documented in this report are preliminary and upon coder review may  be revised to meet current compliance requirements. Viviann Spare P. Lizzet Hendley, MD 10/09/2022 2:52:09 PM This report has been signed electronically. Number of Addenda: 0

## 2022-10-09 NOTE — Progress Notes (Addendum)
Patient scheduled for colonoscopy later today.  Stopped by to check in on patient.  She is still not having clear stools.  RN notes that patient is not drinking adequate water needed with Sutab.  Colonoscopy is not until 2:00.  Asked RN to notify me if stools are not clear by noon.  May have to abort procedure today, increase prep, and reschedule procedure for tomorrow.

## 2022-10-09 NOTE — Transfer of Care (Signed)
Immediate Anesthesia Transfer of Care Note  Patient: Kerri [name]  Procedure(s) Performed: COLONOSCOPY WITH PROPOFOL BIOPSY SUBMUCOSAL TATTOO INJECTION POLYPECTOMY  Patient Location: PACU and Endoscopy Unit  Anesthesia Type:MAC  Level of Consciousness: drowsy  Airway & Oxygen Therapy: Patient Spontanous Breathing and Patient connected to face mask oxygen  Post-op Assessment: Report given to RN and Post -op Vital signs reviewed and stable  Post vital signs: Reviewed and stable  Last Vitals:  Vitals Value Taken Time  BP 137/55 10/09/22 1446  Temp    Pulse    Resp    SpO2 100% 10/09/22 1446    Last Pain:  Vitals:   10/09/22 1320  TempSrc: Temporal  PainSc: 0-No pain      Patients Stated Pain Goal: 0 (10/07/22 1946)  Complications: No notable events documented.

## 2022-10-09 NOTE — Interval H&P Note (Signed)
History and Physical Interval Note: Patient reports she completed the prep - clear / yellow stools. Colonoscopy to evaluate abnormal CT scan, rule out malignancy. I have discussed risks / benefits of the exam and anesethesia she wishes to proceed, further recommendations pending the results. She has some baseline abdominal distension with this but is not tender, states at baseline.  10/09/2022 2:03 PM  Kerri [name]  has presented today for surgery, with the diagnosis of Abnormal CT scan, colon mass.  The various methods of treatment have been discussed with the patient and family. After consideration of risks, benefits and other options for treatment, the patient has consented to  Procedure(s): COLONOSCOPY WITH PROPOFOL (N/A) as a surgical intervention.  The patient's history has been reviewed, patient examined, no change in status, stable for surgery.  I have reviewed the patient's chart and labs.  Questions were answered to the patient's satisfaction.     Viviann Spare P Hiba Garry

## 2022-10-09 NOTE — Anesthesia Preprocedure Evaluation (Addendum)
Anesthesia Evaluation  Patient identified by MRN, date of birth, ID band Patient awake    Reviewed: Allergy & Precautions, NPO status , Patient's Chart, lab work & pertinent test results, reviewed documented beta blocker date and time   History of Anesthesia Complications Negative for: history of anesthetic complications  Airway Mallampati: III  TM Distance: >3 FB Neck ROM: Limited    Dental  (+) Dental Advisory Given, Upper Dentures   Pulmonary asthma , former smoker   Pulmonary exam normal        Cardiovascular hypertension, Pt. on home beta blockers and Pt. on medications + CAD  Normal cardiovascular exam+ dysrhythmias Ventricular Tachycardia + Valvular Problems/Murmurs AS    '23 TTE - EF 65 to 70%. There is moderate asymmetric left ventricular hypertrophy of the basal-septal segment. Grade I diastolic dysfunction (impaired relaxation). Trivial mitral valve regurgitation. Aortic valve regurgitation is trivial. Mild to moderate aortic valve stenosis. Aortic valve mean gradient measures 20.0 mmHg. DVI of 0.30. Normal LV stroke volume index.      Neuro/Psych  Headaches PSYCHIATRIC DISORDERS Anxiety Depression    CVA, No Residual Symptoms    GI/Hepatic Neg liver ROS,GERD  Controlled and Medicated,, IBS Colon mass    Endo/Other  Hypothyroidism    Renal/GU negative Renal ROS     Musculoskeletal  (+) Arthritis ,    Abdominal   Peds  Hematology  (+) Blood dyscrasia, anemia   Anesthesia Other Findings   Reproductive/Obstetrics                             Anesthesia Physical Anesthesia Plan  ASA: 3  Anesthesia Plan: MAC   Post-op Pain Management: Minimal or no pain anticipated   Induction:   PONV Risk Score and Plan: 2 and Propofol infusion and Treatment may vary due to age or medical condition  Airway Management Planned: Nasal Cannula and Natural Airway  Additional Equipment:  None  Intra-op Plan:   Post-operative Plan:   Informed Consent: I have reviewed the patients History and Physical, chart, labs and discussed the procedure including the risks, benefits and alternatives for the proposed anesthesia with the patient or authorized representative who has indicated his/her understanding and acceptance.   Patient has DNR.  Discussed DNR with patient.     Plan Discussed with: CRNA and Anesthesiologist  Anesthesia Plan Comments: (Lengthy discussion with patient regarding her DNR status. Patient is agreeable to intubation as deemed necessary during the procedure, as well as IV fluids and vasopressors as needed. However, she does not want chest compressions under any circumstance.)       Anesthesia Quick Evaluation

## 2022-10-09 NOTE — Anesthesia Procedure Notes (Signed)
Procedure Name: MAC Date/Time: 10/09/2022 2:06 PM  Performed by: Wynonia Sours, CRNAPre-anesthesia Checklist: Patient identified, Emergency Drugs available, Suction available, Patient being monitored and Timeout performed Patient Re-evaluated:Patient Re-evaluated prior to induction Oxygen Delivery Method: Simple face mask Preoxygenation: Pre-oxygenation with 100% oxygen Induction Type: IV induction Placement Confirmation: positive ETCO2 Dental Injury: Teeth and Oropharynx as per pre-operative assessment

## 2022-10-09 NOTE — Progress Notes (Signed)
PROGRESS NOTE  Kerri [name]  GLO:[id number] DOB: 02-24-1930 DOA: 10/07/2022 PCP: Delma Officer, PA   Brief Narrative:  Patient is a 89 year old female with history of colonic polyps, asthma, basal cell carcinoma of the left forehead, coronary artery disease, depression/anxiety, diverticulosis, hyperlipidemia, fatty liver disease, kidney cancer s/p ablation done in [facility] ,hypertension, hypothyroidism who was brought from [facility] for further evaluation of nausea, decreased oral intake, vomiting,wight loss, black stool.  On presentation, lab work showed hemoglobin of 12.  CT abdomen/pelvis showed circumferential wall thickening of the ascending colon with distention of the cecum, few enlarged mesenteric lymph nodes.  GI consulted for further evaluation.  Plan for colonoscopy  Assessment & Plan:  Principal Problem:   Lesion of colon Active Problems:   Hypothyroidism   Hyperlipidemia   Essential hypertension   Coronary atherosclerosis   History of recurrent UTIs   Grade I diastolic dysfunction   Abnormal CT of the abdomen   Generalized abdominal pain   Loss of weight   Nausea and vomiting  Abnormal CT findings /suspected colonic neoplasm: Presented with decreased oral intake, vomiting, black stool, weight loss.  CT abdomen/pelvis showed circumferential wall thickening in the ascending colon with distention of cecum and ascending colon proximal to this lesion.Possible  malignant neoplasm versus inflammatory/infectious process.  No evidence of SBO. GI planning for colonoscopy.  Normocytic anemia: Hemoglobin dropped from 12-10.4.  Most likely from hemodilution.  She denies any passage of melena or hematochezia   Hypothyroidism: Continue Synthyroid  Hyperlipidemia: Continue rosuvastatin  Hypertension: Continue metoprolol  Coronary artery disease: No anginal symptoms.  Continue beta-blocker, statin  Grade 1 diastolic dysfunction: Follows with cardiology as an outpatient.  Currently  involving  Recurrent UTI: On trimethoprim  Disposition: Lives at ILF.  Toc consulted  Hypokalemia: Supplemented with potassium  Low back pain: Complained of low back pain on admission but not today.No H/O fall.  CT abdomen/pelvis shows degenerative changes in the lumbar spine. If she continues to have low back pain, will need further evaluation with imagings. PT consulted  Goals of care: Elderly patient with suspicion for colonic neoplasm.  CODE STATUS DNR.  History of kidney cancer status post ablation at [facility] 4 years ago.  Will consult palliative care if colonoscopy findings are suspicious for malignancy         DVT prophylaxis:SCDs Start: 10/07/22 1553     Code Status: DNR  Family Communication: Discussed with daughter on 5/20  Patient status:Inpatient  Patient is from :ILF  Anticipated discharge to:ILF  Estimated DC date: 1 to 2 days, needs full workup   Consultants: GI  Procedures:None  Antimicrobials:  Anti-infectives (From admission, onward)    Start     Dose/Rate Route Frequency Ordered Stop   10/07/22 1615  trimethoprim (TRIMPEX) tablet 100 mg        100 mg Oral Daily 10/07/22 1605         Subjective: Patient seen and examined at bedside today.  Hemodynamically stable.  She is not having good colonic bowel preparation.  Still having passage of solid stool.  Complains of some abdominal discomfort but no nausea or vomiting.  Objective: Vitals:   10/08/22 1006 10/08/22 1447 10/08/22 2104 10/09/22 0524  BP: (!) 141/67 136/68 138/71 (!) 152/67  Pulse: 67 65 66 (!) 59  Resp:  18 18 18   Temp:  (!) 97.4 F (36.3 C) (!) 97.3 F (36.3 C) (!) 97.5 F (36.4 C)  TempSrc:  Oral Oral Oral  SpO2:  100% 97%  98%  Weight:      Height:        Intake/Output Summary (Last 24 hours) at 10/09/2022 1033 Last data filed at 10/09/2022 0981 Gross per 24 hour  Intake 994.33 ml  Output --  Net 994.33 ml   Filed Weights   10/07/22 1110 10/07/22 1710  Weight: 58 kg  54.4 kg    Examination:  General exam: Overall comfortable, not in distress, pleasant elderly female HEENT: PERRL Respiratory system:  no wheezes or crackles  Cardiovascular system: S1 & S2 heard, RRR.  Gastrointestinal system: Abdomen is mildly distended, soft and has some mild generalized tenderness Central nervous system: Alert and oriented Extremities: No edema, no clubbing ,no cyanosis Skin: No rashes, no ulcers,no icterus      Data Reviewed: I have personally reviewed following labs and imaging studies  CBC: Recent Labs  Lab 10/07/22 1139 10/09/22 0534  WBC 8.4 5.7  NEUTROABS 5.6  --   HGB 12.0 10.4*  HCT 37.6 33.9*  MCV 84.7 87.4  PLT 284 220   Basic Metabolic Panel: Recent Labs  Lab 10/07/22 1139 10/09/22 0534  NA 135 139  K 3.9 3.4*  CL 102 106  CO2 23 25  GLUCOSE 107* 97  BUN 11 13  CREATININE 0.67 0.68  CALCIUM 9.1 8.4*     No results found for this or any previous visit (from the past 240 hour(s)).   Radiology Studies: No results found.  Scheduled Meds:  cyanocobalamin  500 mcg Oral Daily   fluticasone  2 spray Each Nare Daily   gabapentin  100 mg Oral BID   levothyroxine  75 mcg Oral Q breakfast   loratadine  10 mg Oral Daily   metoprolol succinate  12.5 mg Oral Daily   rosuvastatin  10 mg Oral QHS   tapentadol  50 mg Oral BID   trimethoprim  100 mg Oral Daily   Continuous Infusions:  sodium chloride 20 mL/hr at 10/09/22 0616   sodium chloride 50 mL/hr at 10/09/22 0616   potassium chloride 10 mEq (10/09/22 1022)     LOS: 1 day   Burnadette Pop, MD [facility] P5/20/2024, 10:33 AM

## 2022-10-10 ENCOUNTER — Inpatient Hospital Stay (HOSPITAL_COMMUNITY): Payer: Medicare Other | Admitting: Anesthesiology

## 2022-10-10 ENCOUNTER — Inpatient Hospital Stay (HOSPITAL_COMMUNITY): Payer: Medicare Other

## 2022-10-10 ENCOUNTER — Encounter (HOSPITAL_COMMUNITY)
Admission: EM | Disposition: A | Discharge status: Skilled Nursing Facility | Payer: Self-pay | Source: Skilled Nursing Facility | Attending: Internal Medicine

## 2022-10-10 ENCOUNTER — Telehealth: Payer: Self-pay | Admitting: Internal Medicine

## 2022-10-10 ENCOUNTER — Encounter (HOSPITAL_COMMUNITY): Payer: Self-pay

## 2022-10-10 DIAGNOSIS — K6389 Other specified diseases of intestine: Secondary | ICD-10-CM | POA: Diagnosis not present

## 2022-10-10 DIAGNOSIS — Z7189 Other specified counseling: Secondary | ICD-10-CM

## 2022-10-10 DIAGNOSIS — C189 Malignant neoplasm of colon, unspecified: Secondary | ICD-10-CM | POA: Diagnosis not present

## 2022-10-10 DIAGNOSIS — K639 Disease of intestine, unspecified: Secondary | ICD-10-CM | POA: Diagnosis not present

## 2022-10-10 DIAGNOSIS — K5669 Other partial intestinal obstruction: Secondary | ICD-10-CM

## 2022-10-10 DIAGNOSIS — D630 Anemia in neoplastic disease: Secondary | ICD-10-CM | POA: Diagnosis not present

## 2022-10-10 DIAGNOSIS — Z515 Encounter for palliative care: Secondary | ICD-10-CM

## 2022-10-10 DIAGNOSIS — I10 Essential (primary) hypertension: Secondary | ICD-10-CM

## 2022-10-10 DIAGNOSIS — K648 Other hemorrhoids: Secondary | ICD-10-CM | POA: Diagnosis not present

## 2022-10-10 DIAGNOSIS — R935 Abnormal findings on diagnostic imaging of other abdominal regions, including retroperitoneum: Secondary | ICD-10-CM | POA: Diagnosis not present

## 2022-10-10 DIAGNOSIS — Z87891 Personal history of nicotine dependence: Secondary | ICD-10-CM

## 2022-10-10 DIAGNOSIS — R4589 Other symptoms and signs involving emotional state: Secondary | ICD-10-CM

## 2022-10-10 HISTORY — PX: COLON RESECTION: SHX5231

## 2022-10-10 LAB — CBC
HCT: 30.2 % — ABNORMAL LOW (ref 36.0–46.0)
Hemoglobin: 9.3 g/dL — ABNORMAL LOW (ref 12.0–15.0)
MCH: 27.6 pg (ref 26.0–34.0)
MCHC: 30.8 g/dL (ref 30.0–36.0)
MCV: 89.6 fL (ref 80.0–100.0)
Platelets: 186 10*3/uL (ref 150–400)
RBC: 3.37 MIL/uL — ABNORMAL LOW (ref 3.87–5.11)
RDW: 19.9 % — ABNORMAL HIGH (ref 11.5–15.5)
WBC: 6.8 10*3/uL (ref 4.0–10.5)
nRBC: 0 % (ref 0.0–0.2)

## 2022-10-10 LAB — TYPE AND SCREEN
ABO/RH(D): A POS
Antibody Screen: POSITIVE
DAT, IgG: NEGATIVE

## 2022-10-10 LAB — BASIC METABOLIC PANEL
Anion gap: 8 (ref 5–15)
BUN: 8 mg/dL (ref 8–23)
CO2: 23 mmol/L (ref 22–32)
Calcium: 8.2 mg/dL — ABNORMAL LOW (ref 8.9–10.3)
Chloride: 104 mmol/L (ref 98–111)
Creatinine, Ser: 0.71 mg/dL (ref 0.44–1.00)
GFR, Estimated: >60 mL/min (ref >60)
Glucose, Bld: 84 mg/dL (ref 70–99)
Potassium: 3.6 mmol/L (ref 3.5–5.1)
Sodium: 135 mmol/L (ref 135–145)

## 2022-10-10 LAB — SURGICAL PATHOLOGY

## 2022-10-10 LAB — SURGICAL PCR SCREEN
MRSA, PCR: NEGATIVE
Staphylococcus aureus: NEGATIVE

## 2022-10-10 LAB — CEA: CEA: 1.2 ng/mL (ref 0.0–4.7)

## 2022-10-10 SURGERY — COLON RESECTION LAPAROSCOPIC
Anesthesia: General

## 2022-10-10 MED ORDER — CHLORHEXIDINE GLUCONATE 0.12 % MT SOLN
15.0000 mL | Freq: Once | OROMUCOSAL | Status: AC
  Administered 2022-10-10: 15 mL via OROMUCOSAL

## 2022-10-10 MED ORDER — ONDANSETRON HCL 4 MG/2ML IJ SOLN
INTRAMUSCULAR | Status: AC
  Filled 2022-10-10: qty 2

## 2022-10-10 MED ORDER — 0.9 % SODIUM CHLORIDE (POUR BTL) OPTIME
TOPICAL | Status: DC | PRN
  Administered 2022-10-10: 2000 mL

## 2022-10-10 MED ORDER — DEXAMETHASONE SODIUM PHOSPHATE 10 MG/ML IJ SOLN
INTRAMUSCULAR | Status: AC
  Filled 2022-10-10: qty 1

## 2022-10-10 MED ORDER — ROCURONIUM BROMIDE 10 MG/ML (PF) SYRINGE
PREFILLED_SYRINGE | INTRAVENOUS | Status: AC
  Filled 2022-10-10: qty 10

## 2022-10-10 MED ORDER — DEXAMETHASONE SODIUM PHOSPHATE 4 MG/ML IJ SOLN
INTRAMUSCULAR | Status: DC | PRN
  Administered 2022-10-10: 5 mg via INTRAVENOUS

## 2022-10-10 MED ORDER — SUGAMMADEX SODIUM 200 MG/2ML IV SOLN
INTRAVENOUS | Status: DC | PRN
  Administered 2022-10-10: 150 mg via INTRAVENOUS

## 2022-10-10 MED ORDER — SUCCINYLCHOLINE CHLORIDE 200 MG/10ML IV SOSY
PREFILLED_SYRINGE | INTRAVENOUS | Status: DC | PRN
  Administered 2022-10-10: 100 mg via INTRAVENOUS

## 2022-10-10 MED ORDER — BUPIVACAINE HCL (PF) 0.5 % IJ SOLN
INTRAMUSCULAR | Status: DC | PRN
  Administered 2022-10-10: 10 mL

## 2022-10-10 MED ORDER — ALVIMOPAN 12 MG PO CAPS
12.0000 mg | ORAL_CAPSULE | Freq: Two times a day (BID) | ORAL | Status: DC
  Administered 2022-10-11 – 2022-10-13 (×5): 12 mg via ORAL
  Filled 2022-10-10 (×5): qty 1

## 2022-10-10 MED ORDER — BUPIVACAINE HCL (PF) 0.5 % IJ SOLN
INTRAMUSCULAR | Status: AC
  Filled 2022-10-10: qty 30

## 2022-10-10 MED ORDER — PHENYLEPHRINE 80 MCG/ML (10ML) SYRINGE FOR IV PUSH (FOR BLOOD PRESSURE SUPPORT)
PREFILLED_SYRINGE | INTRAVENOUS | Status: AC
  Filled 2022-10-10: qty 10

## 2022-10-10 MED ORDER — IOHEXOL 300 MG/ML  SOLN
75.0000 mL | Freq: Once | INTRAMUSCULAR | Status: AC | PRN
  Administered 2022-10-10: 75 mL via INTRAVENOUS

## 2022-10-10 MED ORDER — SODIUM CHLORIDE (PF) 0.9 % IJ SOLN
INTRAMUSCULAR | Status: AC
  Filled 2022-10-10: qty 50

## 2022-10-10 MED ORDER — FENTANYL CITRATE PF 50 MCG/ML IJ SOSY
PREFILLED_SYRINGE | INTRAMUSCULAR | Status: AC
  Administered 2022-10-10: 50 ug via INTRAVENOUS
  Filled 2022-10-10: qty 2

## 2022-10-10 MED ORDER — SUCCINYLCHOLINE CHLORIDE 200 MG/10ML IV SOSY
PREFILLED_SYRINGE | INTRAVENOUS | Status: AC
  Filled 2022-10-10: qty 10

## 2022-10-10 MED ORDER — ONDANSETRON HCL 4 MG/2ML IJ SOLN
INTRAMUSCULAR | Status: DC | PRN
  Administered 2022-10-10: 4 mg via INTRAVENOUS

## 2022-10-10 MED ORDER — ACETAMINOPHEN 500 MG PO TABS
1000.0000 mg | ORAL_TABLET | Freq: Three times a day (TID) | ORAL | Status: DC
  Administered 2022-10-10 – 2022-10-25 (×40): 1000 mg via ORAL
  Filled 2022-10-10 (×42): qty 2

## 2022-10-10 MED ORDER — LACTATED RINGERS IR SOLN
Status: DC | PRN
  Administered 2022-10-10: 1000 mL

## 2022-10-10 MED ORDER — ROCURONIUM BROMIDE 10 MG/ML (PF) SYRINGE
PREFILLED_SYRINGE | INTRAVENOUS | Status: DC | PRN
  Administered 2022-10-10 (×2): 10 mg via INTRAVENOUS
  Administered 2022-10-10: 40 mg via INTRAVENOUS

## 2022-10-10 MED ORDER — FENTANYL CITRATE (PF) 100 MCG/2ML IJ SOLN
INTRAMUSCULAR | Status: DC | PRN
  Administered 2022-10-10 (×2): 50 ug via INTRAVENOUS

## 2022-10-10 MED ORDER — METHOCARBAMOL 1000 MG/10ML IJ SOLN: 500.0000 mg | Freq: Four times a day (QID) | INTRAVENOUS | Status: DC | PRN

## 2022-10-10 MED ORDER — LACTATED RINGERS IV SOLN: INTRAVENOUS | Status: DC

## 2022-10-10 MED ORDER — ONDANSETRON HCL 4 MG/2ML IJ SOLN: 4.0000 mg | Freq: Once | INTRAMUSCULAR | Status: DC | PRN

## 2022-10-10 MED ORDER — LIDOCAINE HCL (CARDIAC) PF 100 MG/5ML IV SOSY
PREFILLED_SYRINGE | INTRAVENOUS | Status: DC | PRN
  Administered 2022-10-10: 60 mg via INTRAVENOUS

## 2022-10-10 MED ORDER — PROPOFOL 10 MG/ML IV BOLUS
INTRAVENOUS | Status: DC | PRN
  Administered 2022-10-10: 70 mg via INTRAVENOUS

## 2022-10-10 MED ORDER — PHENYLEPHRINE HCL (PRESSORS) 10 MG/ML IV SOLN
INTRAVENOUS | Status: DC | PRN
  Administered 2022-10-10 (×3): 80 ug via INTRAVENOUS

## 2022-10-10 MED ORDER — PHENYLEPHRINE HCL-NACL 20-0.9 MG/250ML-% IV SOLN
INTRAVENOUS | Status: AC
  Filled 2022-10-10: qty 250

## 2022-10-10 MED ORDER — FENTANYL CITRATE PF 50 MCG/ML IJ SOSY
25.0000 ug | PREFILLED_SYRINGE | INTRAMUSCULAR | Status: DC | PRN
  Administered 2022-10-10: 25 ug via INTRAVENOUS

## 2022-10-10 MED ORDER — PROPOFOL 10 MG/ML IV BOLUS
INTRAVENOUS | Status: AC
  Filled 2022-10-10: qty 20

## 2022-10-10 MED ORDER — LACTATED RINGERS IV SOLN: INTRAVENOUS | Status: DC | PRN

## 2022-10-10 MED ORDER — LIDOCAINE HCL (PF) 2 % IJ SOLN
INTRAMUSCULAR | Status: AC
  Filled 2022-10-10: qty 5

## 2022-10-10 MED ORDER — FENTANYL CITRATE (PF) 250 MCG/5ML IJ SOLN
INTRAMUSCULAR | Status: AC
  Filled 2022-10-10: qty 5

## 2022-10-10 SURGICAL SUPPLY — 70 items
ANTIFOG SOL W/FOAM PAD STRL (MISCELLANEOUS)
APPLIER CLIP 5 13 M/L LIGAMAX5 (MISCELLANEOUS)
APPLIER CLIP ROT 10 11.4 M/L (STAPLE)
APR CLP MED LRG 11.4X10 (STAPLE)
APR CLP MED LRG 5 ANG JAW (MISCELLANEOUS)
BAG COUNTER SPONGE SURGICOUNT (BAG) IMPLANT
BAG SPNG CNTER NS LX DISP (BAG)
BLADE EXTENDED COATED 6.5IN (ELECTRODE) IMPLANT
BLADE HEX COATED 2.75 (ELECTRODE) ×1 IMPLANT
CABLE HIGH FREQUENCY MONO STRZ (ELECTRODE) IMPLANT
CELLS DAT CNTRL 66122 CELL SVR (MISCELLANEOUS) ×1 IMPLANT
CLIP APPLIE 5 13 M/L LIGAMAX5 (MISCELLANEOUS) IMPLANT
CLIP APPLIE ROT 10 11.4 M/L (STAPLE) IMPLANT
DRAIN CHANNEL 19F RND (DRAIN) IMPLANT
DRAPE LAPAROSCOPIC ABDOMINAL (DRAPES) ×1 IMPLANT
DRSG OPSITE POSTOP 4X10 (GAUZE/BANDAGES/DRESSINGS) IMPLANT
DRSG OPSITE POSTOP 4X6 (GAUZE/BANDAGES/DRESSINGS) IMPLANT
DRSG OPSITE POSTOP 4X8 (GAUZE/BANDAGES/DRESSINGS) IMPLANT
ELECT REM PT RETURN 15FT ADLT (MISCELLANEOUS) ×1 IMPLANT
EVACUATOR DRAINAGE 10X20 100CC (DRAIN) IMPLANT
EVACUATOR SILICONE 100CC (DRAIN)
GAUZE SPONGE 4X4 12PLY STRL (GAUZE/BANDAGES/DRESSINGS) ×1 IMPLANT
GLOVE BIO SURGEON STRL SZ7.5 (GLOVE) ×1 IMPLANT
GOWN STRL REUS W/ TWL XL LVL3 (GOWN DISPOSABLE) ×2 IMPLANT
GOWN STRL REUS W/TWL XL LVL3 (GOWN DISPOSABLE) ×2
IRRIG SUCT STRYKERFLOW 2 WTIP (MISCELLANEOUS) ×1
IRRIGATION SUCT STRKRFLW 2 WTP (MISCELLANEOUS) IMPLANT
KIT TURNOVER KIT A (KITS) IMPLANT
LEGGING LITHOTOMY PAIR STRL (DRAPES) ×1 IMPLANT
LIGASURE IMPACT 36 18CM CVD LR (INSTRUMENTS) IMPLANT
NS IRRIG 1000ML POUR BTL (IV SOLUTION) ×2 IMPLANT
PACK COLON (CUSTOM PROCEDURE TRAY) ×1 IMPLANT
PENCIL SMOKE EVACUATOR (MISCELLANEOUS) IMPLANT
PROTECTOR NERVE ULNAR (MISCELLANEOUS) IMPLANT
RELOAD PROXIMATE 75MM BLUE (ENDOMECHANICALS) ×2 IMPLANT
RELOAD STAPLE 75 3.8 BLU REG (ENDOMECHANICALS) IMPLANT
RETRACTOR WND ALEXIS 18 MED (MISCELLANEOUS) IMPLANT
RTRCTR WOUND ALEXIS 18CM MED (MISCELLANEOUS) ×1
SCISSORS LAP 5X35 DISP (ENDOMECHANICALS) ×1 IMPLANT
SEALER TISSUE G2 CVD JAW 45CM (ENDOMECHANICALS) IMPLANT
SET TUBE SMOKE EVAC HIGH FLOW (TUBING) ×1 IMPLANT
SLEEVE Z-THREAD 5X100MM (TROCAR) IMPLANT
SOLUTION ANTFG W/FOAM PAD STRL (MISCELLANEOUS) ×1 IMPLANT
SPIKE FLUID TRANSFER (MISCELLANEOUS) ×1 IMPLANT
STAPLER GUN LINEAR PROX 60 (STAPLE) IMPLANT
STAPLER PROXIMATE 75MM BLUE (STAPLE) IMPLANT
STAPLER VISISTAT 35W (STAPLE) ×1 IMPLANT
SURGILUBE 2OZ TUBE FLIPTOP (MISCELLANEOUS) IMPLANT
SUT MNCRL AB 4-0 PS2 18 (SUTURE) IMPLANT
SUT PDS AB 1 TP1 96 (SUTURE) IMPLANT
SUT PROLENE 0 CT 2 (SUTURE) IMPLANT
SUT PROLENE 2 0 KS (SUTURE) IMPLANT
SUT SILK 2 0 (SUTURE) ×1
SUT SILK 2 0 SH CR/8 (SUTURE) ×1 IMPLANT
SUT SILK 2-0 18XBRD TIE 12 (SUTURE) ×1 IMPLANT
SUT SILK 3 0 (SUTURE) ×1
SUT SILK 3 0 SH CR/8 (SUTURE) ×1 IMPLANT
SUT SILK 3-0 18XBRD TIE 12 (SUTURE) ×1 IMPLANT
SUT VIC AB 2-0 SH 18 (SUTURE) IMPLANT
SUT VICRYL 0 UR6 27IN ABS (SUTURE) IMPLANT
SUT VICRYL 2 0 18  UND BR (SUTURE)
SUT VICRYL 2 0 18 UND BR (SUTURE) IMPLANT
SYS LAPSCP GELPORT 120MM (MISCELLANEOUS)
SYSTEM LAPSCP GELPORT 120MM (MISCELLANEOUS) IMPLANT
TRAY FOLEY MTR SLVR 16FR STAT (SET/KITS/TRAYS/PACK) ×1 IMPLANT
TROCAR 11X100 Z THREAD (TROCAR) IMPLANT
TROCAR BALLN 12MMX100 BLUNT (TROCAR) IMPLANT
TROCAR Z-THREAD SLEEVE 11X100 (TROCAR) IMPLANT
TUBING CONNECTING 10 (TUBING) ×2 IMPLANT
YANKAUER SUCT BULB TIP NO VENT (SUCTIONS) ×1 IMPLANT

## 2022-10-10 NOTE — Telephone Encounter (Signed)
Urgent triage referral received for abnormal CT concerning for carcinoma vs infectious process with enlarged mesenteric lymph nodes. Patient previously had procedure done by Dr. Adela Lank in patient.

## 2022-10-10 NOTE — Progress Notes (Signed)
Progress Note  LOS: 2 days   Chief Complaint: Partially obstructing ascending colon mass concerning for primary colon cancer   Subjective   Patient states she is doing okay today.  Denies nausea, vomiting, abdominal pain.  Reports she is excited to be able to have some type of liquid/solid to eat again after surgery.  Planning for laparoscopic assisted partial colectomy today   Objective   Vital signs in last 24 hours: Temp:  [97.3 F (36.3 C)-98.1 F (36.7 C)] 97.7 F (36.5 C) (05/21 0640) Pulse Rate:  [52-71] 55 (05/21 0640) Resp:  [11-20] 14 (05/21 0640) BP: (128-176)/(54-78) 130/65 (05/21 0640) SpO2:  [93 %-100 %] 100 % (05/21 0640) Weight:  [54.4 kg] 54.4 kg (05/20 1320) Last BM Date : 10/09/22 Last BM recorded by nurses in past 5 days Stool Type: Type 7 (Liquid consistency with no solid pieces) (10/08/2022  8:00 PM)  General:   female in no acute distress Heart:  Regular rate and rhythm; no murmurs Pulm: Clear anteriorly; no wheezing Abdomen: soft, nondistended, normal bowel sounds in all quadrants. Nontender without guarding. No organomegaly appreciated. Extremities:  No edema Neurologic:  Alert and  oriented x4;  No focal deficits.  Psych:  Cooperative. Normal mood and affect.  Intake/Output from previous day: 05/20 0701 - 05/21 0700 In: 400 [P.O.:100; I.V.:300] Out: -  Intake/Output this shift: No intake/output data recorded.  Studies/Results: CT CHEST W CONTRAST  Result Date: 10/10/2022 CLINICAL DATA:  Colon cancer staging EXAM: CT CHEST WITH CONTRAST TECHNIQUE: Multidetector CT imaging of the chest was performed during intravenous contrast administration. RADIATION DOSE REDUCTION: This exam was performed according to the departmental dose-optimization program which includes automated exposure control, adjustment of the mA and/or kV according to patient size and/or use of iterative reconstruction technique. CONTRAST:  75mL OMNIPAQUE IOHEXOL 300 MG/ML  SOLN  COMPARISON:  08/09/2021 FINDINGS: Cardiovascular: There are scattered atherosclerotic plaques and calcifications seen thoracic aorta. There is ectasia of ascending thoracic aorta measuring 3.7 cm. There are no intraluminal filling defects in central pulmonary artery branches. Coronary artery calcifications are seen. Mediastinum/Nodes: There are few subcentimeter nodes in mediastinum and hilar regions. Similar finding was seen in the previous study. Lungs/Pleura: There is no focal pulmonary consolidation. In image 109 of series 3, there is 3 mm subpleural density in left lower lobe with no significant interval change. No other discrete lung nodules are seen. There are linear densities in the posterior lower lung fields. This may suggest scarring or subsegmental atelectasis. There is no pleural effusion or pneumothorax. Upper Abdomen: Subcentimeter low-density seen in the liver may suggest cysts or hemangioma. There is decreased density in liver suggesting fatty infiltration. Musculoskeletal: No focal lytic or sclerotic lesions are seen. IMPRESSION: There are no definite signs of metastatic disease. Subcentimeter nodes in mediastinum suggest reactive hyperplasia. 3 mm subpleural nodule in left lower lobe has not changed suggesting possible granuloma. There are linear densities in the lower lung fields suggesting subsegmental atelectasis. Coronary artery disease. Fatty liver. Subcentimeter low-density in liver may suggest cysts or hemangioma. Electronically Signed   By: Ernie Avena M.D.   On: 10/10/2022 08:56    Lab Results: Recent Labs    10/07/22 1139 10/09/22 0534 10/10/22 0556  WBC 8.4 5.7 6.8  HGB 12.0 10.4* 9.3*  HCT 37.6 33.9* 30.2*  PLT 284 220 186   BMET Recent Labs    10/07/22 1139 10/09/22 0534 10/10/22 0556  NA 135 139 135  K 3.9 3.4* 3.6  CL  102 106 104  CO2 23 25 23   GLUCOSE 107* 97 84  BUN 11 13 8   CREATININE 0.67 0.68 0.71  CALCIUM 9.1 8.4* 8.2*   LFT Recent Labs     10/07/22 1139  PROT 6.9  ALBUMIN 3.7  AST 14*  ALT 10  ALKPHOS 74  BILITOT 0.6   PT/INR No results for input(s): "LABPROT", "INR" in the last 72 hours.   Scheduled Meds:  acetaminophen  1,000 mg Oral On Call to OR   alvimopan  12 mg Oral On Call to OR   Chlorhexidine Gluconate Cloth  6 each Topical Once   cyanocobalamin  500 mcg Oral Daily   feeding supplement  296 mL Oral Once   feeding supplement  592 mL Oral Once   fluticasone  2 spray Each Nare Daily   gabapentin  100 mg Oral BID   levothyroxine  75 mcg Oral Q breakfast   loratadine  10 mg Oral Daily   metoprolol succinate  12.5 mg Oral Daily   rosuvastatin  10 mg Oral QHS   tapentadol  50 mg Oral BID   trimethoprim  100 mg Oral Daily   Continuous Infusions:  cefoTEtan (CEFOTAN) IV        Patient profile:   89 year old female admitted with 3-week history of abdominal pain, intermittent nausea with vomiting, and weight loss.    Impression:   Partially obstructing ascending colon mass -Hgb 9.3 -BUN 8, creatinine 0.71 -CEA pending -Colonoscopy 10/09/22: Likely malignant partially obstructing tumor in ascending colon (biopsies pending). Two 3-10 mm polyps in transverse colon (biopsies pending), internal hemorrhoids   Plan:   -Continue supportive care -Daily MiraLAX to keep stools soft to avoid obstruction -Planning for partial colectomy today, diet per general surgery team -Await CEA and biopsy results  Adrean Heitz Leanna Sato  10/10/2022, 9:39 AM

## 2022-10-10 NOTE — Telephone Encounter (Signed)
Pt is currently admitted. [facility] did colon yesterday that found a paritially obstructing mass. Pt is scheduled to have surgery today. This referral was placed based on the CT scan she had on 5/17.

## 2022-10-10 NOTE — Consult Note (Signed)
Consultation Note Date: 10/10/2022   Patient Name: Kerri [name]  DOB: 1930-04-04  MRN: [id number]  Age / Sex: 89 y.o., female   PCP: Delma Officer, PA Referring Physician: Burnadette Pop, MD  Reason for Consultation: Establishing goals of care     Chief Complaint/History of Present Illness:   Patient is a 89 year old female with a past medical history of colonic polyps, asthma, basal cell carcinoma of the left forehead, CAD, depression/anxiety, diverticulosis, hyperlipidemia, female fatty liver disease, kidney cancer status post ablation done at [facility], hypertension, hypothyroidism, and melanoma who was admitted on 10/07/2022 from [facility] for management of nausea, decreased oral intake, vomiting, weight loss, and black stool.  Upon admission, imaging showed circumferential wall thickening of the ascending colon with distention of the cecum with a few enlarged mesenteric lymph nodes.  GI and surgery were consulted for evaluation.  Patient underwent colonoscopy for biopsy as mass appears to be cancerous.  Further imaging for staging did not show any definitive signs of metastatic disease on CT chest.  Surgery discussed care with patient who is planning for partial colectomy.  Palliative medicine team consulted to assist with complex medical decision making.  Reviewed EMR prior to presenting to bedside.  As per surgery note, planning for partial colectomy this afternoon.  Discussed care with bedside RN prior to presenting to bedside as well.  Presenting to bedside, patient laying comfortably in bed.  Patient's daughter, Kerri [name], present at bedside as well.  Kerri [name] is an [employer] fund[employer].  Introduced myself and the role of the palliative medicine team. Palliative medicine is specialized medical care for people living with serious illness. It focuses on providing relief from the symptoms and stress of a serious illness. The goal is to improve quality of life for both the patient and the family. Daughter very familiar  with the idea of palliative care and also hospice.  Discussed similarities and differences between the 2 with patient.  Patient and daughter easily able to discuss decision to proceed with partial colectomy due to what would likely occur if not pursued with blockage and worsening pain and death.  Acknowledged their decision for surgery and noted palliative medicine could continue to follow-up based on results of biopsy if showing cancer.  Discussed management of symptoms and supporting goals of care moving forward in the outpatient setting based on surgery and results.  Daughter noted this would be appreciated.  Daughter noted the patient will be moving closer to them after cleared from surgery to get more support.  Daughter inquiring if case management could assist with outpatient palliative medicine referral in their new area.  Noted would place [facility] consult to assist.  Spent time providing emotional support to patient as this has been rapidly overwhelming for her.  Patient noted she does not like being in the hospital and she has been fairly healthy most of her life minus some fractures.  Patient notes that outside of the hospital, she enjoys going out to eat and spending time with her dog.  Also enjoys regular visits for plastic surgery interventions. Patient fairly active in the outpatient setting.  Patient acknowledged difficulties with aging and changes in her health.  Empathized with difficult situation.  All questions answered at that time.  Noted palliative medicine team would continue to be available if needed during hospitalization and would place [facility] referral to assist with outpatient palliative medicine since patient will be moving.  Thanked patient and daughter for allowing me to visit today.  Primary Diagnoses  Present  on Admission:  Lesion of colon  Coronary atherosclerosis  Essential hypertension  Hyperlipidemia  Hypothyroidism  History of recurrent UTIs    Past Medical History:   Diagnosis Date   Adenomatous colon polyp    Asthma    Basal cell carcinoma 03/13/2017   left forehead - CX3 + cautery + 5FU   CAD (coronary artery disease)    Chest CTA 10/07: 40% or less pLAD;  ETT 8/09: negative   Carotid bruit    Chronic back pain    Depression    Diverticulosis    DJD (degenerative joint disease)    Dyslipidemia    Dysrhythmia    pvcs   Fatty liver    GERD (gastroesophageal reflux disease)    HTN (hypertension)    Hypothyroidism    IBS (irritable bowel syndrome)    Internal hemorrhoid    Melanoma (HCC)    metastatic; left leg; s/p multiple excisions   Migraine    Osteoporosis    Spinal stenosis    Squamous cell carcinoma of skin 09/02/2015   mid chest - CX3 + 5FU   Squamous cell carcinoma of skin 11/07/2017   left wrist - tx p bx   Squamous cell carcinoma of skin 12/11/2017   left hand - tx p bx   Social History   Socioeconomic History   Marital status: Married    Spouse name: Not on file   Number of children: 4   Years of education: Not on file   Highest education level: Not on file  Occupational History   Occupation: retired  Tobacco Use   Smoking status: Former    Types: Cigarettes    Quit date: 08/31/1948    Years since quitting: 74.1   Smokeless tobacco: Never  Vaping Use   Vaping Use: Never used  Substance and Sexual Activity   Alcohol use: Yes    Alcohol/week: 2.0 standard drinks of alcohol    Types: 2 Glasses of wine per week    Comment: wine   Drug use: No   Sexual activity: Never  Other Topics Concern   Not on file  Social History Narrative   Not on file   Social Determinants of Health   Financial Resource Strain: Not on file  Food Insecurity: No Food Insecurity (10/08/2022)   Hunger Vital Sign    Worried About Running Out of Food in the Last Year: Never true    Ran Out of Food in the Last Year: Never true  Transportation Needs: No Transportation Needs (10/08/2022)   PRAPARE - Scientist, research (physical sciences) (Medical): No    Lack of Transportation (Non-Medical): No  Physical Activity: Not on file  Stress: Not on file  Social Connections: Not on file   Family History  Problem Relation Age of Onset   CAD Mother    CAD Father    Kidney disease Child    Scheduled Meds:  acetaminophen  1,000 mg Oral On Call to OR   alvimopan  12 mg Oral On Call to OR   Chlorhexidine Gluconate Cloth  6 each Topical Once   cyanocobalamin  500 mcg Oral Daily   feeding supplement  296 mL Oral Once   feeding supplement  592 mL Oral Once   fluticasone  2 spray Each Nare Daily   gabapentin  100 mg Oral BID   levothyroxine  75 mcg Oral Q breakfast   loratadine  10 mg Oral Daily   metoprolol succinate  12.5  mg Oral Daily   rosuvastatin  10 mg Oral QHS   tapentadol  50 mg Oral BID   trimethoprim  100 mg Oral Daily   Continuous Infusions:  cefoTEtan (CEFOTAN) IV     PRN Meds:.acetaminophen **OR** acetaminophen, busPIRone, HYDROmorphone (DILAUDID) injection, ondansetron **OR** ondansetron (ZOFRAN) IV Allergies  Allergen Reactions   Buprenorphine Hcl Itching and Other (See Comments)    Happened awhile back- patient noted that any narcotic-based medication makes her have a "bad reaction"   Codeine Nausea And Vomiting and Other (See Comments)    Patient gets sick to her stomach Other reaction(s): nausea   Morphine Itching and Other (See Comments)    Happened awhile back- patient noted that any narcotic-based medication makes her have a "bad reaction" Other reaction(s): itching   Tramadol Nausea And Vomiting and Other (See Comments)    Confusion and severe headaches, also   Adhesive [Tape] Other (See Comments)    Tears the skin    Doxycycline Hyclate Other (See Comments)    Makes the patient "ill"   Hydrocodone-Acetaminophen Nausea And Vomiting    Other reaction(s): Other   Other Other (See Comments)    Other reaction(s): headache, dizzy and nausea Other reaction(s): Other   Oxycodone Hcl      Other reaction(s): Unknown   Tapentadol Other (See Comments)    Dizziness with doses higher than 50 mg twice a day Other reaction(s): Other Dizziness with doses higher than 50 mg twice a day   Lactose Intolerance (Gi) Nausea Only and Other (See Comments)    GI upset    Oxycodone Nausea And Vomiting    Other reaction(s): nausea, vomiting   CBC:    Component Value Date/Time   WBC 6.8 10/10/2022 0556   HGB 9.3 (L) 10/10/2022 0556   HGB 13.0 02/11/2019 1413   HGB 12.4 08/25/2008 1525   HCT 30.2 (L) 10/10/2022 0556   HCT 40.4 02/11/2019 1413   HCT 37.5 08/25/2008 1525   PLT 186 10/10/2022 0556   PLT 248 02/11/2019 1413   MCV 89.6 10/10/2022 0556   MCV 92 02/11/2019 1413   MCV 86.0 08/25/2008 1525   NEUTROABS 5.6 10/07/2022 1139   NEUTROABS 2.5 08/25/2008 1525   LYMPHSABS 2.1 10/07/2022 1139   LYMPHSABS 1.9 08/25/2008 1525   MONOABS 0.7 10/07/2022 1139   MONOABS 0.5 08/25/2008 1525   EOSABS 0.0 10/07/2022 1139   EOSABS 0.1 08/25/2008 1525   BASOSABS 0.0 10/07/2022 1139   BASOSABS 0.0 08/25/2008 1525   Comprehensive Metabolic Panel:    Component Value Date/Time   NA 135 10/10/2022 0556   NA 138 02/11/2019 1413   K 3.6 10/10/2022 0556   CL 104 10/10/2022 0556   CO2 23 10/10/2022 0556   BUN 8 10/10/2022 0556   BUN 14 02/11/2019 1413   CREATININE 0.71 10/10/2022 0556   GLUCOSE 84 10/10/2022 0556   CALCIUM 8.2 (L) 10/10/2022 0556   AST 14 (L) 10/07/2022 1139   ALT 10 10/07/2022 1139   ALKPHOS 74 10/07/2022 1139   BILITOT 0.6 10/07/2022 1139   PROT 6.9 10/07/2022 1139   ALBUMIN 3.7 10/07/2022 1139    Physical Exam: Vital Signs: BP 130/65 (BP Location: Right Arm)   Pulse (!) 55   Temp 97.7 F (36.5 C) (Oral)   Resp 14   Ht 5\' 5"  (1.651 m)   Wt 54.4 kg   SpO2 100%   BMI 19.96 kg/m  SpO2: SpO2: 100 % O2 Device: O2 Device: Room Air O2 Flow  Rate:   Intake/output summary:  Intake/Output Summary (Last 24 hours) at 10/10/2022 1610 Last data filed at 10/09/2022  2211 Gross per 24 hour  Intake 400 ml  Output --  Net 400 ml   LBM: Last BM Date : 10/09/22 Baseline Weight: Weight: 58 kg Most recent weight: Weight: 54.4 kg  General: NAD, alert, laying in bed Eyes: No drainage noted HENT: moist mucous membranes Cardiovascular: RRR, no edema in LE b/l Respiratory: no increased work of breathing noted, not in respiratory distress Abdomen: not distended Skin: no rashes or lesions on visible skin Neuro: A&Ox4, following commands easily Psych: appropriately answers all questions          Palliative Performance Scale: 70%              Additional Data Reviewed: Recent Labs    10/09/22 0534 10/10/22 0556  WBC 5.7 6.8  HGB 10.4* 9.3*  PLT 220 186  NA 139 135  BUN 13 8  CREATININE 0.68 0.71    Imaging: CT CHEST W CONTRAST CLINICAL DATA:  Colon cancer staging  EXAM: CT CHEST WITH CONTRAST  TECHNIQUE: Multidetector CT imaging of the chest was performed during intravenous contrast administration.  RADIATION DOSE REDUCTION: This exam was performed according to the departmental dose-optimization program which includes automated exposure control, adjustment of the mA and/or kV according to patient size and/or use of iterative reconstruction technique.  CONTRAST:  75mL OMNIPAQUE IOHEXOL 300 MG/ML  SOLN  COMPARISON:  08/09/2021  FINDINGS: Cardiovascular: There are scattered atherosclerotic plaques and calcifications seen thoracic aorta. There is ectasia of ascending thoracic aorta measuring 3.7 cm. There are no intraluminal filling defects in central pulmonary artery branches. Coronary artery calcifications are seen.  Mediastinum/Nodes: There are few subcentimeter nodes in mediastinum and hilar regions. Similar finding was seen in the previous study.  Lungs/Pleura: There is no focal pulmonary consolidation. In image 109 of series 3, there is 3 mm subpleural density in left lower lobe with no significant interval change. No other  discrete lung nodules are seen. There are linear densities in the posterior lower lung fields. This may suggest scarring or subsegmental atelectasis. There is no pleural effusion or pneumothorax.  Upper Abdomen: Subcentimeter low-density seen in the liver may suggest cysts or hemangioma. There is decreased density in liver suggesting fatty infiltration.  Musculoskeletal: No focal lytic or sclerotic lesions are seen.  IMPRESSION: There are no definite signs of metastatic disease. Subcentimeter nodes in mediastinum suggest reactive hyperplasia. 3 mm subpleural nodule in left lower lobe has not changed suggesting possible granuloma. There are linear densities in the lower lung fields suggesting subsegmental atelectasis.  Coronary artery disease. Fatty liver. Subcentimeter low-density in liver may suggest cysts or hemangioma.  Electronically Signed   By: Ernie Avena M.D.   On: 10/10/2022 08:56    I personally reviewed recent imaging.   Palliative Care Assessment and Plan Summary of Established Goals of Care and Medical Treatment Preferences   Patient is a 89 year old female with a past medical history of colonic polyps, asthma, basal cell carcinoma of the left forehead, CAD, depression/anxiety, diverticulosis, hyperlipidemia, female fatty liver disease, kidney cancer status post ablation done at [facility], hypertension, hypothyroidism, and melanoma who was admitted on 10/07/2022 from [facility] for management of nausea, decreased oral intake, vomiting, weight loss, and black stool.  Upon admission, imaging showed circumferential wall thickening of the ascending colon with distention of the cecum with a few enlarged mesenteric lymph nodes.  GI and surgery were consulted for  evaluation.  Patient underwent colonoscopy for biopsy as mass appears to be cancerous.  Further imaging for staging did not show any definitive signs of metastatic disease on CT chest.  Surgery discussed care with  patient who is planning for partial colectomy.  Palliative medicine team consulted to assist with complex medical decision making.  # Complex medical decision making/goals of care  -Discussed care with patient and daughter at bedside as described above in HPI.  Patient has ACP documentation already on file naming HCPOA and wishes regarding medical care.  Patient is already DNR.  Patient appropriately undergoing surgery at this time to prevent complete obstruction of colon.  Based on results of surgery and biopsy, will need further discussions in the outpatient setting about appropriate therapies for management.  Patient was fairly active prior to this hospitalization. Patient and daughter agreeing to outpatient palliative medicine referral.  -  Code Status: DNR   # Symptom management  -As per primary hospitalist and surgery team   # Psycho-social/Spiritual Support:  - Support System: daughter  # Discharge Planning:  TBD  -Patient and daughter agreed with outpatient palliative medicine referral. Daughter noted patient would be moving closer to them. Consulted TOC to assist with coordination.   Thank you for allowing the palliative care team to participate in the care Hinton Lovely.  Alvester Morin, DO Palliative Care Provider PMT # [phone]  If patient remains symptomatic despite maximum doses, please call PMT at [phone] between 0700 and 1900. Outside of these hours, please call attending, as PMT does not have night coverage.  This provider spent a total of 82 minutes providing patient's care.  Includes review of EMR, discussing care with other staff members involved in patient's medical care, obtaining relevant history and information from patient and/or patient's family, and personal review of imaging and lab work. Greater than 50% of the time was spent counseling and coordinating care related to the above assessment and plan.    *Please note that this is a verbal dictation therefore any  spelling or grammatical errors are due to the "[employer]" system interpretation.

## 2022-10-10 NOTE — Anesthesia Preprocedure Evaluation (Addendum)
Anesthesia Evaluation  Patient identified by MRN, date of birth, ID band Patient awake    Reviewed: Allergy & Precautions, NPO status , Patient's Chart, lab work & pertinent test results, reviewed documented beta blocker date and time   Airway Mallampati: II  TM Distance: >3 FB Neck ROM: Full    Dental  (+) Dental Advisory Given, Edentulous Upper   Pulmonary asthma , former smoker   Pulmonary exam normal breath sounds clear to auscultation       Cardiovascular hypertension, Pt. on home beta blockers + CAD and + Peripheral Vascular Disease  + Valvular Problems/Murmurs AS  Rhythm:Regular Rate:Normal + Systolic murmurs Echo 12/20/21: 1. Left ventricular ejection fraction, by estimation, is 65 to 70%. The  left ventricle has normal function. The left ventricle has no regional  wall motion abnormalities. There is moderate asymmetric left ventricular  hypertrophy of the basal-septal  segment. Left ventricular diastolic parameters are consistent with Grade I  diastolic dysfunction (impaired relaxation).   2. Right ventricular systolic function is normal. The right ventricular  size is normal. There is normal pulmonary artery systolic pressure.   3. The mitral valve is normal in structure. Trivial mitral valve  regurgitation. No evidence of mitral stenosis.   4. The aortic valve is calcified. Aortic valve regurgitation is trivial.  Mild to moderate aortic valve stenosis. Aortic valve mean gradient  measures 20.0 mmHg. DVI of 0.30. Normal LV stroke volume index.   5. The inferior vena cava is normal in size with greater than 50%  respiratory variability, suggesting right atrial pressure of 3 mmHg.     Neuro/Psych  Headaches PSYCHIATRIC DISORDERS Anxiety Depression    CVA    GI/Hepatic Neg liver ROS,GERD  ,,ASCENDING COLON MASS   Endo/Other  Hypothyroidism    Renal/GU Renal disease (AKI)     Musculoskeletal  (+) Arthritis ,     Abdominal   Peds  Hematology  (+) Blood dyscrasia, anemia   Anesthesia Other Findings Day of surgery medications reviewed with the patient.  Metastatic melanoma   Reproductive/Obstetrics                             Anesthesia Physical Anesthesia Plan  ASA: 4  Anesthesia Plan: General   Post-op Pain Management: Toradol IV (intra-op)* and Tylenol PO (pre-op)*   Induction: Intravenous, Rapid sequence and Cricoid pressure planned  PONV Risk Score and Plan: 4 or greater and Dexamethasone and Ondansetron  Airway Management Planned: Oral ETT  Additional Equipment: Arterial line  Intra-op Plan:   Post-operative Plan: Possible Post-op intubation/ventilation  Informed Consent: I have reviewed the patients History and Physical, chart, labs and discussed the procedure including the risks, benefits and alternatives for the proposed anesthesia with the patient or authorized representative who has indicated his/her understanding and acceptance.   Patient has DNR.  Discussed DNR with patient and Suspend DNR.   Dental advisory given  Plan Discussed with: CRNA  Anesthesia Plan Comments:         Anesthesia Quick Evaluation

## 2022-10-10 NOTE — Transfer of Care (Signed)
Immediate Anesthesia Transfer of Care Note  Patient: Kerri [name]  Procedure(s) Performed: COLON RESECTION LAPAROSCOPIC  Patient Location: PACU  Anesthesia Type:General  Level of Consciousness: awake, alert , and oriented  Airway & Oxygen Therapy: Patient Spontanous Breathing and Patient connected to face mask oxygen  Post-op Assessment: Report given to RN and Post -op Vital signs reviewed and stable  Post vital signs: Reviewed and stable  Last Vitals:  Vitals Value Taken Time  BP 171/95 10/10/22 1418  Temp    Pulse 86 10/10/22 1421  Resp 12 10/10/22 1421  SpO2 100 % 10/10/22 1421  Vitals shown include unvalidated device data.  Last Pain:  Vitals:   10/10/22 1041  TempSrc: Oral  PainSc: 3       Patients Stated Pain Goal: 0 (10/07/22 1946)  Complications: No notable events documented.

## 2022-10-10 NOTE — Progress Notes (Signed)
OR team able to remove earrings and ring. Hand delivered by SS RN to 25floor RN Vernona Rieger) to place with patient belongings.

## 2022-10-10 NOTE — Anesthesia Procedure Notes (Signed)
Procedure Name: Intubation Date/Time: 10/10/2022 12:20 PM  Performed by: Cleda Clarks, CRNAPre-anesthesia Checklist: Patient identified, Emergency Drugs available, Suction available and Patient being monitored Patient Re-evaluated:Patient Re-evaluated prior to induction Oxygen Delivery Method: Circle system utilized Preoxygenation: Pre-oxygenation with 100% oxygen Induction Type: IV induction and Rapid sequence Ventilation: Mask ventilation without difficulty Laryngoscope Size: Miller and 2 Grade View: Grade II Tube type: Oral Tube size: 7.0 mm Number of attempts: 1 Airway Equipment and Method: Stylet and Oral airway Placement Confirmation: ETT inserted through vocal cords under direct vision, positive ETCO2 and breath sounds checked- equal and bilateral Secured at: 22 cm Tube secured with: Tape Dental Injury: Teeth and Oropharynx as per pre-operative assessment

## 2022-10-10 NOTE — Progress Notes (Signed)
Patient ID: Kerri [name], female   DOB: [birth year], 89 y.o.   MRN: [id number]   Pre Procedure note for inpatients:   Kemonie Duca has been scheduled for a LAPAROSCOPIC PARTIAL COLECTOMY today. The various methods of treatment have been discussed with the patient. After consideration of the risks, benefits and treatment options the patient has consented to the planned procedure. Please also refer to consult note yesterday  The patient has been seen and labs reviewed. There are no changes in the patient's condition to prevent proceeding with the planned procedure today.  Recent labs:  Lab Results  Component Value Date   WBC 6.8 10/10/2022   HGB 9.3 (L) 10/10/2022   HCT 30.2 (L) 10/10/2022   PLT 186 10/10/2022   GLUCOSE 84 10/10/2022   CHOL 98 08/15/2021   TRIG 136 08/15/2021   HDL 29 (L) 08/15/2021   LDLCALC 42 08/15/2021   ALT 10 10/07/2022   AST 14 (L) 10/07/2022   NA 135 10/10/2022   K 3.6 10/10/2022   CL 104 10/10/2022   CREATININE 0.71 10/10/2022   BUN 8 10/10/2022   CO2 23 10/10/2022   TSH 1.473 08/10/2021   INR 1.1 12/19/2021   HGBA1C 5.3 10/09/2022    Abigail Miyamoto, MD 10/10/2022 10:13 AM

## 2022-10-10 NOTE — Op Note (Signed)
Kerri [name] 10/07/2022 - 10/10/2022   Pre-op Diagnosis: ASCENDING COLON MASS     Post-op Diagnosis: same  Procedure(s): LAPAROSCOPIC ASSISTED ILEOCOLECTOMY  Surgeon(s): Abigail Miyamoto, MD  Anesthesia: General  Staff:  Circulator: Martina Sinner, RN; Stevie Kern, RN Physician Assistant: Adam Phenix, PA-C Scrub Person: Tilden Fossa; Pershing Cox M  Estimated Blood Loss: Minimal               Specimens: RIGHT COLON AND ILEUM SENT TO PATH  Indications: This is a 89 year old who presented with abdominal pain.  She was found to have a colonic mass on CT scan which was confirmed with colonoscopy.  It had the appearance of a near obstructing colon malignancy.  After discussion with the family decision was made to proceed with a laparoscopic assisted partial colectomy.  Findings: The patient was found to have a large mass just proximal to the hepatic flexure which appeared consistent with a malignancy.  There was no evidence of gross metastatic disease  Procedure: The patient was brought to the operating identified as a correct patient.  She was placed upon the operating table and general anesthesia was induced.  Her abdomen was prepped and draped in the usual sterile fashion.  I made a small vertical incision below the umbilicus with a scalpel.  I carried this down to the fascia which was then opened the scalpel as well.  A hemostat was used to enter into the abdominal cavity under direct vision.  A 0 Vicryl pursestring suture was placed around the fascial opening.  The [facility] port was placed at the opening and insufflation of the abdomen was begun.  The tattooing from the colonoscopy was easily identified in the ascending colon near the hepatic flexure.  I placed a 5 mm trocar in the patient's epigastrium and another in the lower abdomen midline under direct vision.  I then mobilized the colon along the white line of Toldt with the LigaSure.  The cecum itself was  again very floppy and the lateral attachments were thickened.  I was able to mobilize the cecum and right colon further.  I then started taking down the hepatic flexure with the scalpel as well.  At this point it was then difficult to identify the duodenum given the thickness of the mass and colon and this area.  At this point decision was made to convert to the open portion of the procedure.  The abdomen was deflated removed all ports.  I then connected the umbilical incision with the incision above the umbilicus with a scalpel.  I then opened the subcutaneous tissue and fascia the entire length of the incision.  A wound protector was then placed.  I was able to eviscerate the cecum, terminal ileum, and transverse colon.  I transected the transverse colon with a GIA 75 stapler.  We then transected the distal ileum with a GIA 75 stapler as well.  We mobilized the remaining lateral attachments of the colon with the cautery.  I then took down the mesentery with the LigaSure device.  We then completed the resection of the mesentery removing the right colon.  The specimen was sent to pathology for evaluation.  We then irrigated the abdomen with saline.  Hemostasis appeared to be achieved.  We then reapproximated the distal small bowel to the transverse colon in a side-to-side fashion with silk sutures.  A colotomy and enterotomy were then created and then a side-to-side anastomosis was performed with the GIA 75 stapler.  The opening was then closed with a TX 60 stapler.  The staple line was then reinforced with interrupted silk sutures.  The anastomosis appeared pink and well-perfused.  The mesenteric defect was then closed interrupted 3-0 silk sutures.  We then again irrigated the abdomen with saline and hemostasis peer to be achieved.  The drapes and wound protector were then removed.  We changed gown and gloves and placed new drapes as per protocol.  We then closed the patient's midline fascia with a running #1  looped PDS suture.  The 1 remaining trocar site was closed with a 4-0 Monocryl suture.  We anesthetized incisions with Marcaine and then closed the incision with staples.  A honeycomb dressing was then applied.  The patient tolerated the procedure well.  All the counts were correct at the end of the procedure.  The patient was then extubated in the operating room and taken in a stable condition to the recovery room.          Abigail Miyamoto   Date: 10/10/2022  Time: 2:03 PM

## 2022-10-10 NOTE — Progress Notes (Signed)
PT Cancellation Note  Patient Details Name: Kerri [name] MRN: [id number] DOB: [birth year]   Cancelled Treatment:    Reason Eval/Treat Not Completed: Patient at procedure or test/unavailable Pt is scheduled for laparoscopic assisted partial colectomy.today, 5/21. PT to continue to follow acutely and evaluate as appropriate.   [name], PT Acute Rehab   Jacqualyn Posey 10/10/2022, 9:58 AM

## 2022-10-10 NOTE — Progress Notes (Signed)
PROGRESS NOTE  Clo Aldi  ZOX:[id number] DOB: Apr 22, 1930 DOA: 10/07/2022 PCP: Delma Officer, PA   Brief Narrative:  Patient is a 89 year old female with history of colonic polyps, asthma, basal cell carcinoma of the left forehead, coronary artery disease, depression/anxiety, diverticulosis, hyperlipidemia, fatty liver disease, kidney cancer s/p ablation done in [facility] ,hypertension, hypothyroidism who was brought from [facility] for further evaluation of nausea, decreased oral intake, vomiting,wight loss, black stool.  On presentation, lab work showed hemoglobin of 12.  CT abdomen/pelvis showed circumferential wall thickening of the ascending colon with distention of the cecum, few enlarged mesenteric lymph nodes.  GI consulted for further evaluation.  Colonoscopy done on 5/20 showed partially obstructing ascending colon mass concerning for primary colon cancer.  Chest CT has not shown any signs of metastatic disease.  General surgery planning for partial colectomy today  Assessment & Plan:  Principal Problem:   Lesion of colon Active Problems:   Hypothyroidism   Hyperlipidemia   Essential hypertension   Coronary atherosclerosis   History of recurrent UTIs   Grade I diastolic dysfunction   Abnormal CT of the abdomen   Generalized abdominal pain   Loss of weight   Nausea and vomiting   Colonic mass   Benign neoplasm of colon   Palliative care encounter   Goals of care, counseling/discussion   Need for emotional support   Counseling and coordination of care  Abnormal CT findings /Colonic neoplasm: Presented with decreased oral intake, vomiting, black stool, weight loss.  CT abdomen/pelvis showed circumferential wall thickening in the ascending colon with distention of cecum and ascending colon proximal to this lesion.Colonoscopy done on 5/20 showed partially obstructing ascending colon mass concerning for primary colon cancer.  Chest CT has not shown any signs of metastatic disease.   General surgery planning for partial colectomy today. CEA pending.  Might need to involve oncology after we get pathology report .  Normocytic anemia: She denies any passage of melena or hematochezia after admission.  Hemoglobin stable in the range of 9  Hypothyroidism: Continue Synthyroid  Hyperlipidemia: Continue rosuvastatin  Hypertension: Continue metoprolol  Coronary artery disease: No anginal symptoms.  Continue beta-blocker, statin  Grade 1 diastolic dysfunction: Follows with cardiology as an outpatient.  Currently euvolemic  Recurrent UTI: On trimethoprim  Disposition: Lives at ILF.  Toc consulted  Low back pain: Complained of low back pain on admission but not today.No H/O fall.  CT abdomen/pelvis shows degenerative changes in the lumbar spine. If she continues to have low back pain, will need further evaluation with imagings.  Denies any back pain today PT consulted  Goals of care: Elderly patient with suspicion for colonic neoplasm.  CODE STATUS DNR.  History of kidney cancer status post ablation at [facility] 4 years ago.  Colonoscopy findings suspicious for cancer.  Palliative care following.          DVT prophylaxis:SCD's Start: 10/09/22 1624 SCDs Start: 10/07/22 1553     Code Status: DNR  Family Communication: Discussed with daughter at bedside  on 5/21  Patient status:Inpatient  Patient is from :ILF  Anticipated discharge to:not sure  Estimated DC date:not sure   Consultants: GI,Surgery  Procedures:Colonoscopy  Antimicrobials:  Anti-infectives (From admission, onward)    Start     Dose/Rate Route Frequency Ordered Stop   10/10/22 0600  cefoTEtan (CEFOTAN) 2 g in sodium chloride 0.9 % 100 mL IVPB        2 g 200 mL/hr over 30 Minutes Intravenous On call to  O.R. 10/09/22 1623 10/10/22 1224   10/07/22 1615  [MAR Hold]  trimethoprim (TRIMPEX) tablet 100 mg        (MAR Hold since Tue 10/10/2022 at 1036.Hold Reason: Transfer to a Procedural area)   100 mg  Oral Daily 10/07/22 1605         Subjective: Patient seen and examined at bedside today.  Hemodynamically stable.  Lying in bed.  Appears comfortable.  Any abdomen pain, nausea or vomiting today.  Waiting for surgery.  Objective: Vitals:   10/09/22 2119 10/10/22 0640 10/10/22 0950 10/10/22 1041  BP: (!) 147/78 130/65 135/72 138/71  Pulse: 64 (!) 55 62   Resp: 14 14    Temp: 98.1 F (36.7 C) 97.7 F (36.5 C)  97.7 F (36.5 C)  TempSrc: Oral Oral  Oral  SpO2: 99% 100%  97%  Weight:    54 kg  Height:    5\' 5"  (1.651 m)    Intake/Output Summary (Last 24 hours) at 10/10/2022 1246 Last data filed at 10/09/2022 2211 Gross per 24 hour  Intake 400 ml  Output --  Net 400 ml   Filed Weights   10/07/22 1710 10/09/22 1320 10/10/22 1041  Weight: 54.4 kg 54.4 kg 54 kg    Examination:  General exam: Overall comfortable, not in distress, pleasant elderly female HEENT: PERRL Respiratory system:  no wheezes or crackles  Cardiovascular system: S1 & S2 heard, RRR.  Gastrointestinal system: Abdomen is mildly  distended, soft and mostly nontender. Central nervous system: Alert and oriented Extremities: No edema, no clubbing ,no cyanosis Skin: No rashes, no ulcers,no icterus     Data Reviewed: I have personally reviewed following labs and imaging studies  CBC: Recent Labs  Lab 10/07/22 1139 10/09/22 0534 10/10/22 0556  WBC 8.4 5.7 6.8  NEUTROABS 5.6  --   --   HGB 12.0 10.4* 9.3*  HCT 37.6 33.9* 30.2*  MCV 84.7 87.4 89.6  PLT 284 220 186   Basic Metabolic Panel: Recent Labs  Lab 10/07/22 1139 10/09/22 0534 10/10/22 0556  NA 135 139 135  K 3.9 3.4* 3.6  CL 102 106 104  CO2 23 25 23   GLUCOSE 107* 97 84  BUN 11 13 8   CREATININE 0.67 0.68 0.71  CALCIUM 9.1 8.4* 8.2*     No results found for this or any previous visit (from the past 240 hour(s)).   Radiology Studies: CT CHEST W CONTRAST  Result Date: 10/10/2022 CLINICAL DATA:  Colon cancer staging EXAM: CT CHEST  WITH CONTRAST TECHNIQUE: Multidetector CT imaging of the chest was performed during intravenous contrast administration. RADIATION DOSE REDUCTION: This exam was performed according to the departmental dose-optimization program which includes automated exposure control, adjustment of the mA and/or kV according to patient size and/or use of iterative reconstruction technique. CONTRAST:  75mL OMNIPAQUE IOHEXOL 300 MG/ML  SOLN COMPARISON:  08/09/2021 FINDINGS: Cardiovascular: There are scattered atherosclerotic plaques and calcifications seen thoracic aorta. There is ectasia of ascending thoracic aorta measuring 3.7 cm. There are no intraluminal filling defects in central pulmonary artery branches. Coronary artery calcifications are seen. Mediastinum/Nodes: There are few subcentimeter nodes in mediastinum and hilar regions. Similar finding was seen in the previous study. Lungs/Pleura: There is no focal pulmonary consolidation. In image 109 of series 3, there is 3 mm subpleural density in left lower lobe with no significant interval change. No other discrete lung nodules are seen. There are linear densities in the posterior lower lung fields. This may suggest scarring or  subsegmental atelectasis. There is no pleural effusion or pneumothorax. Upper Abdomen: Subcentimeter low-density seen in the liver may suggest cysts or hemangioma. There is decreased density in liver suggesting fatty infiltration. Musculoskeletal: No focal lytic or sclerotic lesions are seen. IMPRESSION: There are no definite signs of metastatic disease. Subcentimeter nodes in mediastinum suggest reactive hyperplasia. 3 mm subpleural nodule in left lower lobe has not changed suggesting possible granuloma. There are linear densities in the lower lung fields suggesting subsegmental atelectasis. Coronary artery disease. Fatty liver. Subcentimeter low-density in liver may suggest cysts or hemangioma. Electronically Signed   By: Ernie Avena M.D.   On:  10/10/2022 08:56    Scheduled Meds:  [MAR Hold] cyanocobalamin  500 mcg Oral Daily   feeding supplement  296 mL Oral Once   feeding supplement  592 mL Oral Once   [MAR Hold] fluticasone  2 spray Each Nare Daily   [MAR Hold] gabapentin  100 mg Oral BID   [MAR Hold] levothyroxine  75 mcg Oral Q breakfast   [MAR Hold] loratadine  10 mg Oral Daily   [MAR Hold] metoprolol succinate  12.5 mg Oral Daily   phenylephrine       [MAR Hold] rosuvastatin  10 mg Oral QHS   [MAR Hold] tapentadol  50 mg Oral BID   [MAR Hold] trimethoprim  100 mg Oral Daily   Continuous Infusions:  lactated ringers 10 mL/hr at 10/10/22 1210     LOS: 2 days   Burnadette Pop, MD [facility] P5/21/2024, 12:46 PM

## 2022-10-10 NOTE — Anesthesia Postprocedure Evaluation (Signed)
Anesthesia Post Note  Patient: Kerri [name]  Procedure(s) Performed: COLON RESECTION LAPAROSCOPIC     Patient location during evaluation: PACU Anesthesia Type: General Level of consciousness: awake and alert Pain management: pain level controlled Vital Signs Assessment: post-procedure vital signs reviewed and stable Respiratory status: spontaneous breathing, nonlabored ventilation, respiratory function stable and patient connected to nasal cannula oxygen Cardiovascular status: blood pressure returned to baseline and stable Postop Assessment: no apparent nausea or vomiting Anesthetic complications: no   No notable events documented.  Last Vitals:  Vitals:   10/10/22 1418 10/10/22 1430  BP: (!) 171/95 (!) 184/70  Pulse: 74 76  Resp: 12 12  Temp: 36.5 C   SpO2: 100% 100%    Last Pain:  Vitals:   10/10/22 1041  TempSrc: Oral  PainSc: 3                  Collene Schlichter

## 2022-10-11 ENCOUNTER — Encounter (HOSPITAL_COMMUNITY): Payer: Self-pay | Admitting: Surgery

## 2022-10-11 DIAGNOSIS — K639 Disease of intestine, unspecified: Secondary | ICD-10-CM | POA: Diagnosis not present

## 2022-10-11 DIAGNOSIS — Z9049 Acquired absence of other specified parts of digestive tract: Secondary | ICD-10-CM

## 2022-10-11 LAB — BASIC METABOLIC PANEL
Anion gap: 8 (ref 5–15)
BUN: 10 mg/dL (ref 8–23)
CO2: 25 mmol/L (ref 22–32)
Calcium: 8.4 mg/dL — ABNORMAL LOW (ref 8.9–10.3)
Chloride: 102 mmol/L (ref 98–111)
Creatinine, Ser: 0.65 mg/dL (ref 0.44–1.00)
GFR, Estimated: >60 mL/min (ref >60)
Glucose, Bld: 123 mg/dL — ABNORMAL HIGH (ref 70–99)
Potassium: 4.1 mmol/L (ref 3.5–5.1)
Sodium: 135 mmol/L (ref 135–145)

## 2022-10-11 LAB — CBC
HCT: 31.8 % — ABNORMAL LOW (ref 36.0–46.0)
Hemoglobin: 10.4 g/dL — ABNORMAL LOW (ref 12.0–15.0)
MCH: 30.7 pg (ref 26.0–34.0)
MCHC: 32.7 g/dL (ref 30.0–36.0)
MCV: 93.8 fL (ref 80.0–100.0)
Platelets: 253 10*3/uL (ref 150–400)
RBC: 3.39 MIL/uL — ABNORMAL LOW (ref 3.87–5.11)
RDW: 23.2 % — ABNORMAL HIGH (ref 11.5–15.5)
WBC: 11.7 10*3/uL — ABNORMAL HIGH (ref 4.0–10.5)
nRBC: 0 % (ref 0.0–0.2)

## 2022-10-11 MED ORDER — LIDOCAINE 5 % EX PTCH
1.0000 | MEDICATED_PATCH | CUTANEOUS | Status: DC
  Administered 2022-10-11 – 2022-10-25 (×15): 1 via TRANSDERMAL
  Filled 2022-10-11 (×15): qty 1

## 2022-10-11 MED ORDER — ENOXAPARIN SODIUM 40 MG/0.4ML IJ SOSY
40.0000 mg | PREFILLED_SYRINGE | INTRAMUSCULAR | Status: DC
  Administered 2022-10-11 – 2022-10-13 (×3): 40 mg via SUBCUTANEOUS
  Filled 2022-10-11 (×3): qty 0.4

## 2022-10-11 MED ORDER — METHOCARBAMOL 500 MG PO TABS
500.0000 mg | ORAL_TABLET | Freq: Four times a day (QID) | ORAL | Status: DC | PRN
  Administered 2022-10-11 – 2022-10-24 (×3): 500 mg via ORAL
  Filled 2022-10-11 (×3): qty 1

## 2022-10-11 NOTE — Progress Notes (Signed)
[facility] Progress Note  1 Day Post-Op  Subjective: CC:  Having incisional pain worse with movement, hiccups, coughing, etc. Denies nausea or emesis. Thinks she has passed gas. Expresses anxiety about not knowing pathology results.   Foley was removed this morning and she has not yet voided. Objective: Vital signs in last 24 hours: Temp:  [97.7 F (36.5 C)-98 F (36.7 C)] 98 F (36.7 C) (05/22 0646) Pulse Rate:  [65-76] 65 (05/22 0646) Resp:  [12-18] 14 (05/22 0646) BP: (138-184)/(69-95) 142/69 (05/22 0646) SpO2:  [93 %-100 %] 97 % (05/22 0646) Weight:  [54 kg] 54 kg (05/21 1041) Last BM Date : 10/09/22  Intake/Output from previous day: 05/21 0701 - 05/22 0700 In: 1520 [P.O.:120; I.V.:1400] Out: 1550 [Urine:1450; Blood:100] Intake/Output this shift: No intake/output data recorded.  PE: Gen:  Alert, NAD, pleasant Card:  Regular rate and rhythm Pulm:  Normal effort ORA Abd: Soft, appropraitely tender, midline incision c/d/I with some sanguinous strikethrough.  Skin: warm and dry, no rashes  Psych: A&Ox4 - person, [place], 2024, surgery for possible colon cancer   Lab Results:  Recent Labs    10/10/22 0556 10/11/22 0612  WBC 6.8 11.7*  HGB 9.3* 10.4*  HCT 30.2* 31.8*  PLT 186 253   BMET Recent Labs    10/10/22 0556 10/11/22 0612  NA 135 135  K 3.6 4.1  CL 104 102  CO2 23 25  GLUCOSE 84 123*  BUN 8 10  CREATININE 0.71 0.65  CALCIUM 8.2* 8.4*   PT/INR No results for input(s): "LABPROT", "INR" in the last 72 hours. CMP     Component Value Date/Time   NA 135 10/11/2022 0612   NA 138 02/11/2019 1413   K 4.1 10/11/2022 0612   CL 102 10/11/2022 0612   CO2 25 10/11/2022 0612   GLUCOSE 123 (H) 10/11/2022 0612   BUN 10 10/11/2022 0612   BUN 14 02/11/2019 1413   CREATININE 0.65 10/11/2022 0612   CALCIUM 8.4 (L) 10/11/2022 0612   PROT 6.9 10/07/2022 1139   ALBUMIN 3.7 10/07/2022 1139   AST 14 (L) 10/07/2022 1139   ALT 10 10/07/2022  1139   ALKPHOS 74 10/07/2022 1139   BILITOT 0.6 10/07/2022 1139   GFRNONAA >60 10/11/2022 0612   GFRAA >60 10/13/2019 2238   Lipase     Component Value Date/Time   LIPASE 26 10/25/2017 1815       Studies/Results: CT CHEST W CONTRAST  Result Date: 10/10/2022 CLINICAL DATA:  Colon cancer staging EXAM: CT CHEST WITH CONTRAST TECHNIQUE: Multidetector CT imaging of the chest was performed during intravenous contrast administration. RADIATION DOSE REDUCTION: This exam was performed according to the departmental dose-optimization program which includes automated exposure control, adjustment of the mA and/or kV according to patient size and/or use of iterative reconstruction technique. CONTRAST:  75mL OMNIPAQUE IOHEXOL 300 MG/ML  SOLN COMPARISON:  08/09/2021 FINDINGS: Cardiovascular: There are scattered atherosclerotic plaques and calcifications seen thoracic aorta. There is ectasia of ascending thoracic aorta measuring 3.7 cm. There are no intraluminal filling defects in central pulmonary artery branches. Coronary artery calcifications are seen. Mediastinum/Nodes: There are few subcentimeter nodes in mediastinum and hilar regions. Similar finding was seen in the previous study. Lungs/Pleura: There is no focal pulmonary consolidation. In image 109 of series 3, there is 3 mm subpleural density in left lower lobe with no significant interval change. No other discrete lung nodules are seen. There are linear densities in the posterior lower lung fields. This may  suggest scarring or subsegmental atelectasis. There is no pleural effusion or pneumothorax. Upper Abdomen: Subcentimeter low-density seen in the liver may suggest cysts or hemangioma. There is decreased density in liver suggesting fatty infiltration. Musculoskeletal: No focal lytic or sclerotic lesions are seen. IMPRESSION: There are no definite signs of metastatic disease. Subcentimeter nodes in mediastinum suggest reactive hyperplasia. 3 mm  subpleural nodule in left lower lobe has not changed suggesting possible granuloma. There are linear densities in the lower lung fields suggesting subsegmental atelectasis. Coronary artery disease. Fatty liver. Subcentimeter low-density in liver may suggest cysts or hemangioma. Electronically Signed   By: Ernie Avena M.D.   On: 10/10/2022 08:56    Anti-infectives: Anti-infectives (From admission, onward)    Start     Dose/Rate Route Frequency Ordered Stop   10/10/22 0600  cefoTEtan (CEFOTAN) 2 g in sodium chloride 0.9 % 100 mL IVPB        2 g 200 mL/hr over 30 Minutes Intravenous On call to O.R. 10/09/22 1623 10/11/22 0700   10/07/22 1615  trimethoprim (TRIMPEX) tablet 100 mg        100 mg Oral Daily 10/07/22 1605          Assessment/Plan  Partially obstructing ascending colon mass, concerning for primary colon cancer   S/P colonoscopy 5/20, await path S/P laparoscopic assisted partial colectomy 5/21 Dr. Magnus Ivan - POD#1, AFVSS, WBC 11.7 (not unexpected post-op), hgb stable 10.4  - allow CLD and await bowel function, continue entereg  - OOB, PT eval  - IS/pulm toilet  -start daily lovenox for DVT ppx    LOS: 3 days   I reviewed nursing notes, Consultant GI notes, hospitalist notes, last 24 h vitals and pain scores, last 48 h intake and output, last 24 h labs and trends, and last 24 h imaging results.   Hosie Spangle, PA-C [facility] Please see Amion for pager number during day hours 7:00am-4:30pm

## 2022-10-11 NOTE — Progress Notes (Addendum)
Progress Note   LOS: 3 days   Chief Complaint:Partially obstructing ascending colon mass concerning for primary colon cancer    Subjective   Orts abdominal pain today.  States she did not have abdominal pain prior to yesterday (she went for partial colectomy yesterday).  Denies nausea/vomiting.  Reports right shoulder pain.  She is unsure whether it is day or night time.  She is also unsure what hospital she is in.   Objective   Vital signs in last 24 hours: Temp:  [97.7 F (36.5 C)-98 F (36.7 C)] 98 F (36.7 C) (05/22 0646) Pulse Rate:  [62-76] 65 (05/22 0646) Resp:  [12-18] 14 (05/22 0646) BP: (135-184)/(69-95) 142/69 (05/22 0646) SpO2:  [93 %-100 %] 97 % (05/22 0646) Weight:  [54 kg] 54 kg (05/21 1041) Last BM Date : 10/09/22 Last BM recorded by nurses in past 5 days Stool Type: Type 7 (Liquid consistency with no solid pieces) (10/08/2022  8:00 PM)  General:   female, appears somewhat agitated, unsure whether it is day/night and what hospital she is in Heart:  Regular rate and rhythm; no murmurs Pulm: Clear anteriorly; no wheezing Abdomen: Soft, generalized tenderness  extremities:  No edema Neurologic: Disoriented Psych:  Cooperative. Normal mood and affect.  Intake/Output from previous day: 05/21 0701 - 05/22 0700 In: 1520 [P.O.:120; I.V.:1400] Out: 1550 [Urine:1450; Blood:100] Intake/Output this shift: No intake/output data recorded.  Studies/Results: CT CHEST W CONTRAST  Result Date: 10/10/2022 CLINICAL DATA:  Colon cancer staging EXAM: CT CHEST WITH CONTRAST TECHNIQUE: Multidetector CT imaging of the chest was performed during intravenous contrast administration. RADIATION DOSE REDUCTION: This exam was performed according to the departmental dose-optimization program which includes automated exposure control, adjustment of the mA and/or kV according to patient size and/or use of iterative reconstruction technique. CONTRAST:  75mL OMNIPAQUE IOHEXOL 300 MG/ML   SOLN COMPARISON:  08/09/2021 FINDINGS: Cardiovascular: There are scattered atherosclerotic plaques and calcifications seen thoracic aorta. There is ectasia of ascending thoracic aorta measuring 3.7 cm. There are no intraluminal filling defects in central pulmonary artery branches. Coronary artery calcifications are seen. Mediastinum/Nodes: There are few subcentimeter nodes in mediastinum and hilar regions. Similar finding was seen in the previous study. Lungs/Pleura: There is no focal pulmonary consolidation. In image 109 of series 3, there is 3 mm subpleural density in left lower lobe with no significant interval change. No other discrete lung nodules are seen. There are linear densities in the posterior lower lung fields. This may suggest scarring or subsegmental atelectasis. There is no pleural effusion or pneumothorax. Upper Abdomen: Subcentimeter low-density seen in the liver may suggest cysts or hemangioma. There is decreased density in liver suggesting fatty infiltration. Musculoskeletal: No focal lytic or sclerotic lesions are seen. IMPRESSION: There are no definite signs of metastatic disease. Subcentimeter nodes in mediastinum suggest reactive hyperplasia. 3 mm subpleural nodule in left lower lobe has not changed suggesting possible granuloma. There are linear densities in the lower lung fields suggesting subsegmental atelectasis. Coronary artery disease. Fatty liver. Subcentimeter low-density in liver may suggest cysts or hemangioma. Electronically Signed   By: Ernie Avena M.D.   On: 10/10/2022 08:56    Lab Results: Recent Labs    10/09/22 0534 10/10/22 0556 10/11/22 0612  WBC 5.7 6.8 11.7*  HGB 10.4* 9.3* 10.4*  HCT 33.9* 30.2* 31.8*  PLT 220 186 253   BMET Recent Labs    10/09/22 0534 10/10/22 0556 10/11/22 0612  NA 139 135 135  K 3.4* 3.6 4.1  CL 106 104 102  CO2 25 23 25   GLUCOSE 97 84 123*  BUN 13 8 10   CREATININE 0.68 0.71 0.65  CALCIUM 8.4* 8.2* 8.4*   LFT No  results for input(s): "PROT", "ALBUMIN", "AST", "ALT", "ALKPHOS", "BILITOT", "BILIDIR", "IBILI" in the last 72 hours. PT/INR No results for input(s): "LABPROT", "INR" in the last 72 hours.   Scheduled Meds:  acetaminophen  1,000 mg Oral TID   alvimopan  12 mg Oral BID   cyanocobalamin  500 mcg Oral Daily   fluticasone  2 spray Each Nare Daily   gabapentin  100 mg Oral BID   levothyroxine  75 mcg Oral Q breakfast   lidocaine  1 patch Transdermal Q24H   loratadine  10 mg Oral Daily   metoprolol succinate  12.5 mg Oral Daily   rosuvastatin  10 mg Oral QHS   tapentadol  50 mg Oral BID   trimethoprim  100 mg Oral Daily   Continuous Infusions:    Patient profile:   89 year old female admitted with 3-week history of abdominal pain, intermittent nausea with vomiting, and weight loss.   Impression:   Partially obstructing ascending colon mass -Hgb 10.4 -BUN 10, Cr. 0.65 -CEA 1.2 -Colonoscopy 10/09/22: Likely malignant ascending colon tumor in ascending colon (bx - superficial fragments of tubular adenoma with extensive high-grade dysplasia/intramucosal carcinoma suspicious for invasion). Two 3-10 mm polyps in transverse colon (sporadic tubular adenoma and sessile serrated lesion without dysplasia), internal hemorrhoids - s/p LAPAROSCOPIC ASSISTED ILEOCOLECTOMY 5/21. Path pending.    Plan:   - await surgical biopsies - continue supportive care - diet per surgical team - patient appears to be experiencing hospital delirium, defer to primary team  Bayley Leanna Sato  10/11/2022, 9:11 AM   Attending physician's note   I have taken a history, reviewed the chart and examined the patient. I performed a substantive portion of this encounter, including complete performance of at least one of the key components, in conjunction with the APP. I agree with the APP's note, impression and recommendations.    Status post right hemicolectomy for partially obstructive ascending colon mass  lesion Will defer supportive care and pain management to primary team and surgery  GI will sign off, available if have any questions.     Iona Beard , MD [phone]

## 2022-10-11 NOTE — Progress Notes (Signed)
PROGRESS NOTE  Kerri [name]  ZOX:[id number] DOB: [birth year] DOA: 10/07/2022 PCP: Delma Officer, PA   Brief Narrative:  Patient is a 89 year old female with history of colonic polyps, asthma, basal cell carcinoma of the left forehead, coronary artery disease, depression/anxiety, diverticulosis, hyperlipidemia, fatty liver disease, kidney cancer s/p ablation done in [facility] ,hypertension, hypothyroidism who was brought from [facility] for further evaluation of nausea, decreased oral intake, vomiting,wight loss, black stool.  On presentation, lab work showed hemoglobin of 12.  CT abdomen/pelvis showed circumferential wall thickening of the ascending colon with distention of the cecum, few enlarged mesenteric lymph nodes.  GI consulted for further evaluation.  Colonoscopy done on 5/20 showed partially obstructing ascending colon mass concerning for primary colon cancer.  Chest CT has not shown any signs of metastatic disease.  General surgery did laparoscopic assisted ileocolectomy on 5/21, pathology pending  Assessment & Plan:  Principal Problem:   Lesion of colon Active Problems:   Hypothyroidism   Hyperlipidemia   Essential hypertension   Coronary atherosclerosis   History of recurrent UTIs   Grade I diastolic dysfunction   Abnormal CT of the abdomen   Generalized abdominal pain   Loss of weight   Nausea and vomiting   Colonic mass   Benign neoplasm of colon   Palliative care encounter   Goals of care, counseling/discussion   Need for emotional support   Counseling and coordination of care  Abnormal CT findings /Colonic neoplasm: Presented with decreased oral intake, vomiting, black stool, weight loss.  CT abdomen/pelvis showed circumferential wall thickening in the ascending colon with distention of cecum and ascending colon proximal to this lesion.Colonoscopy done on 5/20 showed partially obstructing ascending colon mass concerning for primary colon cancer.  Biopsies showed superficial  fragments of tubular adenoma with extensive high-grade dysplasia/intramucosal carcinoma suspicious for invasion.  Chest CT has not shown any signs of metastatic disease.  CEA normal.   General surgery did laparoscopic assisted ileocolectomy on 5/21, pathology pending.Started on clear liquid diet  Normocytic anemia: She denies any passage of melena or hematochezia after admission.  Hemoglobin stable in the range of 9-10  Hypothyroidism: Continue Synthyroid  Hyperlipidemia: Continue rosuvastatin  Hypertension: Continue metoprolol  Coronary artery disease: No anginal symptoms.  Continue beta-blocker, statin  Grade 1 diastolic dysfunction: Follows with cardiology as an outpatient.  Currently euvolemic  Recurrent UTI: On trimethoprim  Disposition: Lives at ILF.  Toc consulted  Goals of care: Elderly patient with suspicion for colonic neoplasm.  CODE STATUS DNR.  History of kidney cancer status post ablation at [facility] 4 years ago. Palliative care was following.          DVT prophylaxis:enoxaparin (LOVENOX) injection 40 mg Start: 10/11/22 1200 SCDs Start: 10/07/22 1553     Code Status: DNR  Family Communication: Called and discussed with daughter on phone on 5/22  Patient status:Inpatient  Patient is from :ILF  Anticipated discharge to:not sure  Estimated DC date:not sure   Consultants: GI,Surgery  Procedures:Colonoscopy, partial colectomy  Antimicrobials:  Anti-infectives (From admission, onward)    Start     Dose/Rate Route Frequency Ordered Stop   10/10/22 0600  cefoTEtan (CEFOTAN) 2 g in sodium chloride 0.9 % 100 mL IVPB        2 g 200 mL/hr over 30 Minutes Intravenous On call to O.R. 10/09/22 1623 10/11/22 0700   10/07/22 1615  trimethoprim (TRIMPEX) tablet 100 mg        100 mg Oral Daily 10/07/22 1605  Subjective: Patient seen and examined at bedside today.  During evaluation, she looked comfortable but confused.  She was not aware about the date and  time.  She was very anxious about her pathology report.  She says she is passing some gas.  Complains of some abdominal  discomfort  Objective: Vitals:   10/10/22 1530 10/10/22 1603 10/10/22 2049 10/11/22 0646  BP: (!) 157/82 (!) 159/75 (!) 160/77 (!) 142/69  Pulse: 70 72 74 65  Resp: 12 16 14 14   Temp:  97.9 F (36.6 C) 97.9 F (36.6 C) 98 F (36.7 C)  TempSrc:  Oral Oral Oral  SpO2: 95% 93% 95% 97%  Weight:      Height:        Intake/Output Summary (Last 24 hours) at 10/11/2022 1110 Last data filed at 10/11/2022 0600 Gross per 24 hour  Intake 1520 ml  Output 1550 ml  Net -30 ml   Filed Weights   10/07/22 1710 10/09/22 1320 10/10/22 1041  Weight: 54.4 kg 54.4 kg 54 kg    Examination:  General exam: Overall comfortable, not in distress, pleasant elderly female, slightly confused today HEENT: PERRL Respiratory system:  no wheezes or crackles  Cardiovascular system: S1 & S2 heard, RRR.  Gastrointestinal system: Abdomen is nondistended, soft, appropriately tender.  Bowel sounds not heard, surgical wound Central nervous system: Alert and awake, not oriented to time Extremities: No edema, no clubbing ,no cyanosis Skin: No rashes, no ulcers,no icterus     Data Reviewed: I have personally reviewed following labs and imaging studies  CBC: Recent Labs  Lab 10/07/22 1139 10/09/22 0534 10/10/22 0556 10/11/22 0612  WBC 8.4 5.7 6.8 11.7*  NEUTROABS 5.6  --   --   --   HGB 12.0 10.4* 9.3* 10.4*  HCT 37.6 33.9* 30.2* 31.8*  MCV 84.7 87.4 89.6 93.8  PLT 284 220 186 253   Basic Metabolic Panel: Recent Labs  Lab 10/07/22 1139 10/09/22 0534 10/10/22 0556 10/11/22 0612  NA 135 139 135 135  K 3.9 3.4* 3.6 4.1  CL 102 106 104 102  CO2 23 25 23 25   GLUCOSE 107* 97 84 123*  BUN 11 13 8 10   CREATININE 0.67 0.68 0.71 0.65  CALCIUM 9.1 8.4* 8.2* 8.4*     Recent Results (from the past 240 hour(s))  Surgical pcr screen     Status: None   Collection Time: 10/10/22 11:25  AM   Specimen: Nasal Mucosa; Nasal Swab  Result Value Ref Range Status   MRSA, PCR NEGATIVE NEGATIVE Final   Staphylococcus aureus NEGATIVE NEGATIVE Final    Comment: (NOTE) The Xpert SA Assay (FDA approved for NASAL specimens in patients 88 years of age and older), is one component of a comprehensive surveillance program. It is not intended to diagnose infection nor to guide or monitor treatment. Performed at [facility], [address] [place], [place] [address]      Radiology Studies: CT CHEST W CONTRAST  Result Date: 10/10/2022 CLINICAL DATA:  Colon cancer staging EXAM: CT CHEST WITH CONTRAST TECHNIQUE: Multidetector CT imaging of the chest was performed during intravenous contrast administration. RADIATION DOSE REDUCTION: This exam was performed according to the departmental dose-optimization program which includes automated exposure control, adjustment of the mA and/or kV according to patient size and/or use of iterative reconstruction technique. CONTRAST:  75mL OMNIPAQUE IOHEXOL 300 MG/ML  SOLN COMPARISON:  08/09/2021 FINDINGS: Cardiovascular: There are scattered atherosclerotic plaques and calcifications seen thoracic aorta. There is ectasia  of ascending thoracic aorta measuring 3.7 cm. There are no intraluminal filling defects in central pulmonary artery branches. Coronary artery calcifications are seen. Mediastinum/Nodes: There are few subcentimeter nodes in mediastinum and hilar regions. Similar finding was seen in the previous study. Lungs/Pleura: There is no focal pulmonary consolidation. In image 109 of series 3, there is 3 mm subpleural density in left lower lobe with no significant interval change. No other discrete lung nodules are seen. There are linear densities in the posterior lower lung fields. This may suggest scarring or subsegmental atelectasis. There is no pleural effusion or pneumothorax. Upper Abdomen: Subcentimeter low-density seen in the liver may  suggest cysts or hemangioma. There is decreased density in liver suggesting fatty infiltration. Musculoskeletal: No focal lytic or sclerotic lesions are seen. IMPRESSION: There are no definite signs of metastatic disease. Subcentimeter nodes in mediastinum suggest reactive hyperplasia. 3 mm subpleural nodule in left lower lobe has not changed suggesting possible granuloma. There are linear densities in the lower lung fields suggesting subsegmental atelectasis. Coronary artery disease. Fatty liver. Subcentimeter low-density in liver may suggest cysts or hemangioma. Electronically Signed   By: Ernie Avena M.D.   On: 10/10/2022 08:56    Scheduled Meds:  acetaminophen  1,000 mg Oral TID   alvimopan  12 mg Oral BID   cyanocobalamin  500 mcg Oral Daily   enoxaparin (LOVENOX) injection  40 mg Subcutaneous Q24H   fluticasone  2 spray Each Nare Daily   gabapentin  100 mg Oral BID   levothyroxine  75 mcg Oral Q breakfast   lidocaine  1 patch Transdermal Q24H   loratadine  10 mg Oral Daily   metoprolol succinate  12.5 mg Oral Daily   rosuvastatin  10 mg Oral QHS   tapentadol  50 mg Oral BID   trimethoprim  100 mg Oral Daily   Continuous Infusions:     LOS: 3 days   Burnadette Pop, MD [facility] P5/22/2024, 11:10 AM

## 2022-10-11 NOTE — Progress Notes (Signed)
Mobility Specialist Cancellation/Refusal Note:   Pt declined mobility at this time with no specific reasoning. "I just don't want to"   Will check back as schedule permits.     [facility]

## 2022-10-11 NOTE — Evaluation (Signed)
Physical Therapy Evaluation Patient Details Name: Kerri [name] MRN: [id number] DOB: [birth year] Today's Date: 10/11/2022  History of Present Illness  Pt is 89 yo female on 10/07/22 with colonic neoplasm s/p laparoscopic assisted ileocolectomy on 10/10/22.  Pt with hx including colonic polyps, asthma, basal cell carcinoma of the left forehead, coronary artery disease, depression/anxiety, diverticulosis, hyperlipidemia, fatty liver disease, kidney cancer s/p ablation done in [facility] ,hypertension, hypothyroidism  Clinical Impression  Pt admitted with above diagnosis. At baseline, pt resides at ILF and is normally ambulatory with AD.  Today, pt distracted by diagnosis and frequently stating "I'm going to die what is the point."  Tried to encourage/educate on maintaining mobility and independence as able.  She expressed wanting to be in control and die comfortably. Pt reports has not been told complete diagnosis or prognosis yet but is convinced she is going to die.  She was resistant to any cues/recommendations. She was light min A for transfers and to take some side steps at EOB but refused further.   If pt cooperative, could progress well with mobility.  Pt currently with functional limitations due to the deficits listed below (see PT Problem List). Pt will benefit from acute skilled PT to increase their independence and safety with mobility to allow discharge.          Recommendations for follow up therapy are one component of a multi-disciplinary discharge planning process, led by the attending physician.  Recommendations may be updated based on patient status, additional functional criteria and insurance authorization.  Follow Up Recommendations       Assistance Recommended at Discharge Frequent or constant Supervision/Assistance  Patient can return home with the following  A little help with walking and/or transfers;A little help with bathing/dressing/bathroom    Equipment Recommendations None  recommended by PT  Recommendations for Other Services       Functional Status Assessment Patient has had a recent decline in their functional status and demonstrates the ability to make significant improvements in function in a reasonable and predictable amount of time.     Precautions / Restrictions Precautions Precautions: Fall      Mobility  Bed Mobility Overal bed mobility: Needs Assistance Bed Mobility: Supine to Sit, Sit to Supine     Supine to sit: Min assist Sit to supine: Min assist   General bed mobility comments: Light min A; pt wants to do what she can and no open to recommendations    Transfers Overall transfer level: Needs assistance Equipment used: Rolling walker (2 wheels) Transfers: Sit to/from Stand Sit to Stand: Min assist           General transfer comment: Light Min A    Ambulation/Gait Ambulation/Gait assistance: Min Chemical engineer (Feet): 3 Feet Assistive device: Rolling walker (2 wheels) Gait Pattern/deviations: Step-to pattern Gait velocity: decreased     General Gait Details: Side steps to [facility] with light min A; c/o R shoulder and abdomen pain; not open to cues; does not want to try more or transfer to chair despite encouragement/education b/c "what is the point, I'm going to die."  Stairs            Wheelchair Mobility    Modified Rankin (Stroke Patients Only)       Balance Overall balance assessment: Needs assistance Sitting-balance support: No upper extremity supported Sitting balance-Leahy Scale: Good     Standing balance support: No upper extremity supported Standing balance-Leahy Scale: Fair Standing balance comment: Could static stand without support; RW to  ambulate                             Pertinent Vitals/Pain Pain Assessment Pain Assessment: Faces Faces Pain Scale: Hurts little more Pain Location: Abdomen and R shoulder Pain Descriptors / Indicators: Discomfort, Grimacing Pain  Intervention(s): Limited activity within patient's tolerance, Monitored during session, Premedicated before session, Repositioned    Home Living Family/patient expects to be discharged to:: Other (Comment) ([facility])                 Home Equipment: Shower Counsellor (2 wheels) Additional Comments: Pt from [facility]    Prior Function               Mobility Comments: Reports ambulates with cane on her own and RW when she walks her dog. ADLs Comments: Reports independent with ADLs     Hand Dominance        Extremity/Trunk Assessment   Upper Extremity Assessment Upper Extremity Assessment: RUE deficits/detail;LUE deficits/detail RUE Deficits / Details: Reports R shoulder pain.  Pt not overly agreeable to any testing - strength appears functional LUE Deficits / Details: Pt not overly agreeable to any testing - strength appears functional    Lower Extremity Assessment Lower Extremity Assessment: LLE deficits/detail;RLE deficits/detail RLE Deficits / Details: Pt not overly agreeable to any testing - strength appears functional LLE Deficits / Details: Pt not overly agreeable to any testing - strength appears functional    Cervical / Trunk Assessment Cervical / Trunk Assessment: Other exceptions Cervical / Trunk Exceptions: abdominal surgery  Communication   Communication: No difficulties  Cognition Arousal/Alertness: Awake/alert Behavior During Therapy: Agitated Overall Cognitive Status: Within Functional Limits for tasks assessed                                 General Comments: Overall cognition intact; however, pt very distracted/focused on cancer and results of biopsy.  She frequently would state "what is the point, so I can die?"  Also, focused on wanting to be in control and not suffer.  Convinced her R shoulder hurting because believes it to has cancer.  Very down about her diagnosis (although she is not aware of prognosis yet) and  required encouragement to participate.  Pt also can be very negative and talking down to staff - she did apologize afterwards stating realizes that she is "rude."        General Comments      Exercises     Assessment/Plan    PT Assessment Patient needs continued PT services  PT Problem List Decreased strength;Pain;Decreased activity tolerance;Decreased knowledge of use of DME;Decreased balance;Decreased safety awareness;Decreased mobility;Decreased knowledge of precautions       PT Treatment Interventions DME instruction;Therapeutic exercise;Gait training;Functional mobility training;Therapeutic activities;Patient/family education;Balance training;Modalities    PT Goals (Current goals can be found in the Care Plan section)  Acute Rehab PT Goals Patient Stated Goal: To die comfortably PT Goal Formulation: With patient Time For Goal Achievement: 10/25/22 Potential to Achieve Goals: Good    Frequency Min 1X/week     Co-evaluation               AM-PAC PT "6 Clicks" Mobility  Outcome Measure Help needed turning from your back to your side while in a flat bed without using bedrails?: A Little Help needed moving from lying on your back to sitting on the  side of a flat bed without using bedrails?: A Little Help needed moving to and from a bed to a chair (including a wheelchair)?: A Little Help needed standing up from a chair using your arms (e.g., wheelchair or bedside chair)?: A Little Help needed to walk in hospital room?: Total (due to limited distance) Help needed climbing 3-5 steps with a railing? : Total 6 Click Score: 14    End of Session Equipment Utilized During Treatment: Gait belt Activity Tolerance: Other (comment) (self-limiting, pain limiting) Patient left: in bed;with call bell/phone within reach;with bed alarm set Nurse Communication: Mobility status PT Visit Diagnosis: Other abnormalities of gait and mobility (R26.89);Muscle weakness (generalized)  (M62.81)    Time: 1102-1130 PT Time Calculation (min) (ACUTE ONLY): 28 min   Charges:   PT Evaluation $PT Eval Moderate Complexity: 1 Mod PT Treatments $Therapeutic Activity: 8-22 mins        Kerri [name], PT Acute Rehab [facility] [phone]   Rayetta Humphrey 10/11/2022, 12:30 PM

## 2022-10-12 DIAGNOSIS — K639 Disease of intestine, unspecified: Secondary | ICD-10-CM | POA: Diagnosis not present

## 2022-10-12 LAB — BASIC METABOLIC PANEL WITH GFR
Anion gap: 9 (ref 5–15)
BUN: 11 mg/dL (ref 8–23)
CO2: 25 mmol/L (ref 22–32)
Calcium: 8.6 mg/dL — ABNORMAL LOW (ref 8.9–10.3)
Chloride: 102 mmol/L (ref 98–111)
Creatinine, Ser: 0.69 mg/dL (ref 0.44–1.00)
GFR, Estimated: >60 mL/min
Glucose, Bld: 130 mg/dL — ABNORMAL HIGH (ref 70–99)
Potassium: 3.7 mmol/L (ref 3.5–5.1)
Sodium: 136 mmol/L (ref 135–145)

## 2022-10-12 LAB — CBC
HCT: 35.2 % — ABNORMAL LOW (ref 36.0–46.0)
Hemoglobin: 11.2 g/dL — ABNORMAL LOW (ref 12.0–15.0)
MCH: 27.9 pg (ref 26.0–34.0)
MCHC: 31.8 g/dL (ref 30.0–36.0)
MCV: 87.6 fL (ref 80.0–100.0)
Platelets: 286 10*3/uL (ref 150–400)
RBC: 4.02 MIL/uL (ref 3.87–5.11)
RDW: 20.4 % — ABNORMAL HIGH (ref 11.5–15.5)
WBC: 10.9 10*3/uL — ABNORMAL HIGH (ref 4.0–10.5)
nRBC: 0 % (ref 0.0–0.2)

## 2022-10-12 MED ORDER — SODIUM CHLORIDE 0.9 % IV SOLN: INTRAVENOUS | Status: DC

## 2022-10-12 NOTE — Progress Notes (Signed)
2 Days Post-Op   Subjective/Chief Complaint: Patient reports incisional pain and mild bloating Passing some flatus No nausea   Objective: Vital signs in last 24 hours: Temp:  [97.7 F (36.5 C)-98.7 F (37.1 C)] 97.7 F (36.5 C) (05/23 0459) Pulse Rate:  [66-85] 79 (05/23 0459) Resp:  [18] 18 (05/22 2040) BP: (137-156)/(70-93) 137/74 (05/23 0459) SpO2:  [97 %-100 %] 97 % (05/23 0459) Last BM Date : 10/09/22  Intake/Output from previous day: 05/22 0701 - 05/23 0700 In: -  Out: 450 [Urine:450] Intake/Output this shift: No intake/output data recorded.  Exam; Awake and alert Abdomen a little full, appropriately tender, dressing dry  Lab Results:  Recent Labs    10/11/22 0612 10/12/22 0621  WBC 11.7* 10.9*  HGB 10.4* 11.2*  HCT 31.8* 35.2*  PLT 253 286   BMET Recent Labs    10/11/22 0612 10/12/22 0621  NA 135 136  K 4.1 3.7  CL 102 102  CO2 25 25  GLUCOSE 123* 130*  BUN 10 11  CREATININE 0.65 0.69  CALCIUM 8.4* 8.6*   PT/INR No results for input(s): "LABPROT", "INR" in the last 72 hours. ABG No results for input(s): "PHART", "HCO3" in the last 72 hours.  Invalid input(s): "PCO2", "PO2"  Studies/Results: CT CHEST W CONTRAST  Result Date: 10/10/2022 CLINICAL DATA:  Colon cancer staging EXAM: CT CHEST WITH CONTRAST TECHNIQUE: Multidetector CT imaging of the chest was performed during intravenous contrast administration. RADIATION DOSE REDUCTION: This exam was performed according to the departmental dose-optimization program which includes automated exposure control, adjustment of the mA and/or kV according to patient size and/or use of iterative reconstruction technique. CONTRAST:  75mL OMNIPAQUE IOHEXOL 300 MG/ML  SOLN COMPARISON:  08/09/2021 FINDINGS: Cardiovascular: There are scattered atherosclerotic plaques and calcifications seen thoracic aorta. There is ectasia of ascending thoracic aorta measuring 3.7 cm. There are no intraluminal filling defects in  central pulmonary artery branches. Coronary artery calcifications are seen. Mediastinum/Nodes: There are few subcentimeter nodes in mediastinum and hilar regions. Similar finding was seen in the previous study. Lungs/Pleura: There is no focal pulmonary consolidation. In image 109 of series 3, there is 3 mm subpleural density in left lower lobe with no significant interval change. No other discrete lung nodules are seen. There are linear densities in the posterior lower lung fields. This may suggest scarring or subsegmental atelectasis. There is no pleural effusion or pneumothorax. Upper Abdomen: Subcentimeter low-density seen in the liver may suggest cysts or hemangioma. There is decreased density in liver suggesting fatty infiltration. Musculoskeletal: No focal lytic or sclerotic lesions are seen. IMPRESSION: There are no definite signs of metastatic disease. Subcentimeter nodes in mediastinum suggest reactive hyperplasia. 3 mm subpleural nodule in left lower lobe has not changed suggesting possible granuloma. There are linear densities in the lower lung fields suggesting subsegmental atelectasis. Coronary artery disease. Fatty liver. Subcentimeter low-density in liver may suggest cysts or hemangioma. Electronically Signed   By: Ernie Avena M.D.   On: 10/10/2022 08:56    Anti-infectives: Anti-infectives (From admission, onward)    Start     Dose/Rate Route Frequency Ordered Stop   10/10/22 0600  cefoTEtan (CEFOTAN) 2 g in sodium chloride 0.9 % 100 mL IVPB        2 g 200 mL/hr over 30 Minutes Intravenous On call to O.R. 10/09/22 1623 10/11/22 0700   10/07/22 1615  trimethoprim (TRIMPEX) tablet 100 mg        100 mg Oral Daily 10/07/22 1605  Assessment/Plan:  Partially obstructing ascending colon mass, concerning for primary colon cancer   POD#2  Keep on clears Needs to work with PT and ambulate Awaiting final path  Abigail Miyamoto MD 10/12/2022

## 2022-10-12 NOTE — TOC Progression Note (Signed)
Transition of Care [facility]) - Progression Note    Patient Details  Name: Kerri [name] MRN: [id number] Date of Birth: August 06, 1929  [facility]) CM/SW Contact  Beckie Busing, RN Phone [phone]  10/12/2022, 8:38 AM  Clinical Narrative:    [facility] acknowledges consult for outpatient palliative referral. Will follow up with patient to\ offer choice.    Expected Discharge Plan: Home/Self Care Barriers to Discharge: Continued Medical Work up  Expected Discharge Plan and Services       Living arrangements for the past 2 months: Independent Living Facility [facility])                                       Social Determinants of Health (SDOH) Interventions SDOH Screenings   Food Insecurity: No Food Insecurity (10/08/2022)  Housing: Low Risk  (10/08/2022)  Transportation Needs: No Transportation Needs (10/08/2022)  Utilities: Not At Risk (10/08/2022)  Tobacco Use: Medium Risk (10/11/2022)    Readmission Risk Interventions    09/11/2021    2:25 PM  Readmission Risk Prevention Plan  Transportation Screening Complete  PCP or Specialist Appt within 3-5 Days Complete  HRI or Home Care Consult Complete  Social Work Consult for Recovery Care Planning/Counseling Complete  Palliative Care Screening Complete

## 2022-10-12 NOTE — Progress Notes (Signed)
PT Cancellation Note  Patient Details Name: Kerri [name] MRN: [id number] DOB: 1929/06/01   Cancelled Treatment:     Therapist in to see pt this afternoon who was upset about losing her ear-pods and c/o too much abdominal pain to tolerate any activity. Will re-attempt next available date/time per POC.    Jannet Askew 10/12/2022, 3:44 PM

## 2022-10-12 NOTE — Progress Notes (Signed)
PROGRESS NOTE  Kerri [name]  ZOX:[id number] DOB: Sep 07, 1929 DOA: 10/07/2022 PCP: Delma Officer, PA   Brief Narrative:  Patient is a 89 year old female with history of colonic polyps, asthma, basal cell carcinoma of the left forehead, coronary artery disease, depression/anxiety, diverticulosis, hyperlipidemia, fatty liver disease, kidney cancer s/p ablation done in [facility] ,hypertension, hypothyroidism who was [facility] for further evaluation of nausea, decreased oral intake, vomiting,wight loss, black stool.  On presentation, lab work showed hemoglobin of 12.  CT abdomen/pelvis showed circumferential wall thickening of the ascending colon with distention of the cecum, few enlarged mesenteric lymph nodes.  GI consulted for further evaluation.  Colonoscopy done on 5/20 showed partially obstructing ascending colon mass concerning for primary colon cancer.  Chest CT has not shown any signs of metastatic disease.  General surgery did laparoscopic assisted ileocolectomy on 5/21, pathology pending  Assessment & Plan:  Principal Problem:   Lesion of colon Active Problems:   Hypothyroidism   Hyperlipidemia   Essential hypertension   Coronary atherosclerosis   History of recurrent UTIs   Grade I diastolic dysfunction   Abnormal CT of the abdomen   Generalized abdominal pain   Loss of weight   Nausea and vomiting   Colonic mass   Benign neoplasm of colon   Palliative care encounter   Goals of care, counseling/discussion   Need for emotional support   Counseling and coordination of care  Abnormal CT findings /Colonic neoplasm: Presented with decreased oral intake, vomiting, black stool, weight loss.  CT abdomen/pelvis showed circumferential wall thickening in the ascending colon with distention of cecum and ascending colon proximal to this lesion.Colonoscopy done on 5/20 showed partially obstructing ascending colon mass concerning for primary colon cancer.  Biopsies showed superficial  fragments of tubular adenoma with extensive high-grade dysplasia/intramucosal carcinoma suspicious for invasion.  Chest CT has not shown any signs of metastatic disease.  CEA normal.   General surgery did laparoscopic assisted ileocolectomy on 5/21, pathology pending.Started on clear liquid diet.  Currently issue is abdominal pain  Normocytic anemia: She denies any passage of melena or hematochezia after admission.  Hemoglobin stable in the range of 9-10  Hypothyroidism: Continue Synthyroid  Hyperlipidemia: Continue rosuvastatin  Hypertension: Continue metoprolol  Coronary artery disease: No anginal symptoms.  Continue beta-blocker, statin  Grade 1 diastolic dysfunction: Follows with cardiology as an outpatient.  Currently euvolemic  Recurrent UTI: On trimethoprim  Disposition: Lives at ILF.  Toc consulted.  PT recommended home health  Goals of care: Elderly patient with suspicion for colonic neoplasm.  CODE STATUS DNR.  History of kidney cancer status post ablation at [facility] 4 years ago. Palliative care was following.          DVT prophylaxis:enoxaparin (LOVENOX) injection 40 mg Start: 10/11/22 1200 SCDs Start: 10/07/22 1553     Code Status: DNR  Family Communication: Called and discussed with daughter on phone on 5/23  Patient status:Inpatient  Patient is from :ILF  Anticipated discharge to: 2 to 3 days  Estimated DC date: 2 to 3 days   Consultants: GI,Surgery  Procedures:Colonoscopy, partial colectomy  Antimicrobials:  Anti-infectives (From admission, onward)    Start     Dose/Rate Route Frequency Ordered Stop   10/10/22 0600  cefoTEtan (CEFOTAN) 2 g in sodium chloride 0.9 % 100 mL IVPB        2 g 200 mL/hr over 30 Minutes Intravenous On call to O.R. 10/09/22 1623 10/11/22 0700   10/07/22 1615  trimethoprim (TRIMPEX) tablet 100  mg        100 mg Oral Daily 10/07/22 1605         Subjective: Patient seen and examined at bedside today.  Hemodynamically stable.   She was watching a movie on her laptop.  Complains of abdominal pain.  She denies any passage of gas or bowel movement. No nausea or vomiting Objective: Vitals:   10/11/22 0646 10/11/22 1438 10/11/22 2040 10/12/22 0459  BP: (!) 142/69 (!) 153/70 (!) 156/93 137/74  Pulse: 65 66 85 79  Resp: 14 18 18    Temp: 98 F (36.7 C) 98.2 F (36.8 C) 98.7 F (37.1 C) 97.7 F (36.5 C)  TempSrc: Oral Oral Oral Oral  SpO2: 97% 100% 99% 97%  Weight:      Height:        Intake/Output Summary (Last 24 hours) at 10/12/2022 1109 Last data filed at 10/11/2022 1905 Gross per 24 hour  Intake --  Output 450 ml  Net -450 ml   Filed Weights   10/07/22 1710 10/09/22 1320 10/10/22 1041  Weight: 54.4 kg 54.4 kg 54 kg    Examination:  General exam: Overall comfortable, not in distress, pleasant elderly female, appears weak and deconditioned HEENT: PERRL Respiratory system:  no wheezes or crackles  Cardiovascular system: S1 & S2 heard, RRR.  Gastrointestinal system: Abdomen is mildly distended, bowel sounds not that heard, appropriately tender, surgical wound  Central nervous system: Alert and oriented Extremities: No edema, no clubbing ,no cyanosis Skin: No rashes, no ulcers,no icterus     Data Reviewed: I have personally reviewed following labs and imaging studies  CBC: Recent Labs  Lab 10/07/22 1139 10/09/22 0534 10/10/22 0556 10/11/22 0612 10/12/22 0621  WBC 8.4 5.7 6.8 11.7* 10.9*  NEUTROABS 5.6  --   --   --   --   HGB 12.0 10.4* 9.3* 10.4* 11.2*  HCT 37.6 33.9* 30.2* 31.8* 35.2*  MCV 84.7 87.4 89.6 93.8 87.6  PLT 284 220 186 253 286   Basic Metabolic Panel: Recent Labs  Lab 10/07/22 1139 10/09/22 0534 10/10/22 0556 10/11/22 0612 10/12/22 0621  NA 135 139 135 135 136  K 3.9 3.4* 3.6 4.1 3.7  CL 102 106 104 102 102  CO2 23 25 23 25 25   GLUCOSE 107* 97 84 123* 130*  BUN 11 13 8 10 11   CREATININE 0.67 0.68 0.71 0.65 0.69  CALCIUM 9.1 8.4* 8.2* 8.4* 8.6*     Recent  Results (from the past 240 hour(s))  Surgical pcr screen     Status: None   Collection Time: 10/10/22 11:25 AM   Specimen: Nasal Mucosa; Nasal Swab  Result Value Ref Range Status   MRSA, PCR NEGATIVE NEGATIVE Final   Staphylococcus aureus NEGATIVE NEGATIVE Final    Comment: (NOTE) The Xpert SA Assay (FDA approved for NASAL specimens in patients 34 years of age and older), is one component of a comprehensive surveillance program. It is not intended to diagnose infection nor to guide or monitor treatment. Performed at [facility], [address] [place], [place] [address]      Radiology Studies: No results found.  Scheduled Meds:  acetaminophen  1,000 mg Oral TID   alvimopan  12 mg Oral BID   cyanocobalamin  500 mcg Oral Daily   enoxaparin (LOVENOX) injection  40 mg Subcutaneous Q24H   fluticasone  2 spray Each Nare Daily   gabapentin  100 mg Oral BID   levothyroxine  75 mcg Oral Q  breakfast   lidocaine  1 patch Transdermal Q24H   loratadine  10 mg Oral Daily   metoprolol succinate  12.5 mg Oral Daily   rosuvastatin  10 mg Oral QHS   tapentadol  50 mg Oral BID   trimethoprim  100 mg Oral Daily   Continuous Infusions:     LOS: 4 days   Burnadette Pop, MD [facility] P5/23/2024, 11:09 AM

## 2022-10-13 DIAGNOSIS — K639 Disease of intestine, unspecified: Secondary | ICD-10-CM | POA: Diagnosis not present

## 2022-10-13 MED ORDER — PROCHLORPERAZINE EDISYLATE 10 MG/2ML IJ SOLN
10.0000 mg | Freq: Four times a day (QID) | INTRAMUSCULAR | Status: DC | PRN
  Administered 2022-10-13 – 2022-10-18 (×3): 10 mg via INTRAVENOUS
  Filled 2022-10-13 (×3): qty 2

## 2022-10-13 MED ORDER — PROCHLORPERAZINE EDISYLATE 10 MG/2ML IJ SOLN: 10.0000 mg | Freq: Four times a day (QID) | INTRAMUSCULAR | Status: DC | PRN

## 2022-10-13 NOTE — Progress Notes (Signed)
PT Cancellation Note  Patient Details Name: Kerri [name] MRN: [id number] DOB: 1930-01-16   Cancelled Treatment:     Therapist in to see pt this afternoon, pt resting comfortably, son at bedside stating she had n/v all morning and finally resting. Son requested re-attempt tomorrow if schedule permits.    Jannet Askew 10/13/2022, 2:46 PM

## 2022-10-13 NOTE — Progress Notes (Signed)
PROGRESS NOTE  Kerri [name]  VWU:[id number] DOB: 1929/06/25 DOA: 10/07/2022 PCP: Delma Officer, PA   Brief Narrative:  Patient is a 89 year old female with history of colonic polyps, asthma, basal cell carcinoma of the left forehead, coronary artery disease, depression/anxiety, diverticulosis, hyperlipidemia, fatty liver disease, kidney cancer s/p ablation done in [facility] ,hypertension, hypothyroidism who was brought from [facility] for further evaluation of nausea, decreased oral intake, vomiting,wight loss, black stool.  On presentation, lab work showed hemoglobin of 12.  CT abdomen/pelvis showed circumferential wall thickening of the ascending colon with distention of the cecum, few enlarged mesenteric lymph nodes.  GI consulted for further evaluation.  Colonoscopy done on 5/20 showed partially obstructing ascending colon mass concerning for primary colon cancer.  Chest CT has not shown any signs of metastatic disease.  General surgery did laparoscopic assisted ileocolectomy on 5/21, pathology pending  Assessment & Plan:  Principal Problem:   Lesion of colon Active Problems:   Hypothyroidism   Hyperlipidemia   Essential hypertension   Coronary atherosclerosis   History of recurrent UTIs   Grade I diastolic dysfunction   Abnormal CT of the abdomen   Generalized abdominal pain   Loss of weight   Nausea and vomiting   Colonic mass   Benign neoplasm of colon   Palliative care encounter   Goals of care, counseling/discussion   Need for emotional support   Counseling and coordination of care  Abnormal CT findings /Colonic neoplasm: Presented with decreased oral intake, vomiting, black stool, weight loss.  CT abdomen/pelvis showed circumferential wall thickening in the ascending colon with distention of cecum and ascending colon proximal to this lesion.Colonoscopy done on 5/20 showed partially obstructing ascending colon mass concerning for primary colon cancer.  Biopsies showed superficial  fragments of tubular adenoma with extensive high-grade dysplasia/intramucosal carcinoma suspicious for invasion.  Chest CT has not shown any signs of metastatic disease.  CEA normal.   General surgery did laparoscopic assisted ileocolectomy on 5/21, pathology pending.Started on full  liquid diet. No BM yet  Normocytic anemia: She denies any passage of melena or hematochezia after admission.  Hemoglobin stable in the range of 9-10  Hypothyroidism: Continue Synthyroid  Hyperlipidemia: Continue rosuvastatin  Hypertension: Continue metoprolol  Coronary artery disease: No anginal symptoms.  Continue beta-blocker, statin  Grade 1 diastolic dysfunction: Follows with cardiology as an outpatient.  Currently euvolemic  Recurrent UTI: On trimethoprim  Disposition: Lives at ILF.  Toc consulted.  PT recommended home health  Goals of care: Elderly patient with suspicion for colonic neoplasm.  CODE STATUS DNR.  History of kidney cancer status post ablation at [facility] 4 years ago. Palliative care was following.          DVT prophylaxis:enoxaparin (LOVENOX) injection 40 mg Start: 10/11/22 1200 SCDs Start: 10/07/22 1553     Code Status: DNR  Family Communication: Called and discussed with daughter on phone on 5/24  Patient status:Inpatient  Patient is from :ILF  Anticipated discharge to: 2 to 3 days  Estimated DC date: 2 to 3 days   Consultants: GI,Surgery  Procedures:Colonoscopy, partial colectomy  Antimicrobials:  Anti-infectives (From admission, onward)    Start     Dose/Rate Route Frequency Ordered Stop   10/10/22 0600  cefoTEtan (CEFOTAN) 2 g in sodium chloride 0.9 % 100 mL IVPB        2 g 200 mL/hr over 30 Minutes Intravenous On call to O.R. 10/09/22 1623 10/11/22 0700   10/07/22 1615  trimethoprim (TRIMPEX) tablet 100 mg  100 mg Oral Daily 10/07/22 1605         Subjective: Patient seen and examined at bedside today.  Hemodynamically stable.  Overall looks  comfortable.  She feels better than yesterday.  No significant abdominal pain like yesterday.  She thinks that she might have passed some gas but no bowel movement yet.  Objective: Vitals:   10/12/22 0459 10/12/22 1259 10/12/22 2033 10/13/22 0612  BP: 137/74 (!) 141/83 132/87 (!) 169/92  Pulse: 79 82 92 92  Resp:  18 18 18   Temp: 97.7 F (36.5 C) (!) 97.4 F (36.3 C) 98 F (36.7 C) 98.3 F (36.8 C)  TempSrc: Oral Oral Oral Oral  SpO2: 97% 97% 96% 94%  Weight:      Height:        Intake/Output Summary (Last 24 hours) at 10/13/2022 1058 Last data filed at 10/13/2022 0309 Gross per 24 hour  Intake 640.38 ml  Output --  Net 640.38 ml   Filed Weights   10/07/22 1710 10/09/22 1320 10/10/22 1041  Weight: 54.4 kg 54.4 kg 54 kg    Examination:  General exam: Overall comfortable, not in distress, appears weak and deconditioned HEENT: PERRL Respiratory system:  no wheezes or crackles  Cardiovascular system: S1 & S2 heard, RRR.  Gastrointestinal system: Abdomen is mildly distended, soft and appropriately tender.  Bowel sounds present.  Surgical wound Central nervous system: Alert and oriented Extremities: No edema, no clubbing ,no cyanosis Skin: No rashes, no ulcers,no icterus     Data Reviewed: I have personally reviewed following labs and imaging studies  CBC: Recent Labs  Lab 10/07/22 1139 10/09/22 0534 10/10/22 0556 10/11/22 0612 10/12/22 0621  WBC 8.4 5.7 6.8 11.7* 10.9*  NEUTROABS 5.6  --   --   --   --   HGB 12.0 10.4* 9.3* 10.4* 11.2*  HCT 37.6 33.9* 30.2* 31.8* 35.2*  MCV 84.7 87.4 89.6 93.8 87.6  PLT 284 220 186 253 286   Basic Metabolic Panel: Recent Labs  Lab 10/07/22 1139 10/09/22 0534 10/10/22 0556 10/11/22 0612 10/12/22 0621  NA 135 139 135 135 136  K 3.9 3.4* 3.6 4.1 3.7  CL 102 106 104 102 102  CO2 23 25 23 25 25   GLUCOSE 107* 97 84 123* 130*  BUN 11 13 8 10 11   CREATININE 0.67 0.68 0.71 0.65 0.69  CALCIUM 9.1 8.4* 8.2* 8.4* 8.6*      Recent Results (from the past 240 hour(s))  Surgical pcr screen     Status: None   Collection Time: 10/10/22 11:25 AM   Specimen: Nasal Mucosa; Nasal Swab  Result Value Ref Range Status   MRSA, PCR NEGATIVE NEGATIVE Final   Staphylococcus aureus NEGATIVE NEGATIVE Final    Comment: (NOTE) The Xpert SA Assay (FDA approved for NASAL specimens in patients 72 years of age and older), is one component of a comprehensive surveillance program. It is not intended to diagnose infection nor to guide or monitor treatment. Performed at [facility], [address] [place], [place] [address]      Radiology Studies: No results found.  Scheduled Meds:  acetaminophen  1,000 mg Oral TID   alvimopan  12 mg Oral BID   cyanocobalamin  500 mcg Oral Daily   enoxaparin (LOVENOX) injection  40 mg Subcutaneous Q24H   fluticasone  2 spray Each Nare Daily   gabapentin  100 mg Oral BID   levothyroxine  75 mcg Oral Q breakfast   lidocaine  1 patch Transdermal Q24H   loratadine  10 mg Oral Daily   metoprolol succinate  12.5 mg Oral Daily   rosuvastatin  10 mg Oral QHS   tapentadol  50 mg Oral BID   trimethoprim  100 mg Oral Daily   Continuous Infusions:  sodium chloride 50 mL/hr at 10/13/22 0309      LOS: 5 days   Burnadette Pop, MD [facility] P5/24/2024, 10:58 AM

## 2022-10-13 NOTE — Progress Notes (Signed)
3 Days Post-Op   Subjective/Chief Complaint: Passing flatus but no BM yet Has a little nausea Not getting up and ambulating yet   Objective: Vital signs in last 24 hours: Temp:  [97.4 F (36.3 C)-98.3 F (36.8 C)] 98.3 F (36.8 C) (05/24 0612) Pulse Rate:  [82-92] 92 (05/24 0612) Resp:  [18] 18 (05/24 0612) BP: (132-169)/(83-92) 169/92 (05/24 0612) SpO2:  [94 %-97 %] 94 % (05/24 0612) Last BM Date : 10/09/22 (she had a colonoscopy on 5/20)  Intake/Output from previous day: 05/23 0701 - 05/24 0700 In: 640.4 [I.V.:640.4] Out: -  Intake/Output this shift: No intake/output data recorded.  Exam: Awake and alert Abdomen a little full, dressing dry  Lab Results:  Recent Labs    10/11/22 0612 10/12/22 0621  WBC 11.7* 10.9*  HGB 10.4* 11.2*  HCT 31.8* 35.2*  PLT 253 286   BMET Recent Labs    10/11/22 0612 10/12/22 0621  NA 135 136  K 4.1 3.7  CL 102 102  CO2 25 25  GLUCOSE 123* 130*  BUN 10 11  CREATININE 0.65 0.69  CALCIUM 8.4* 8.6*   PT/INR No results for input(s): "LABPROT", "INR" in the last 72 hours. ABG No results for input(s): "PHART", "HCO3" in the last 72 hours.  Invalid input(s): "PCO2", "PO2"  Studies/Results: No results found.  Anti-infectives: Anti-infectives (From admission, onward)    Start     Dose/Rate Route Frequency Ordered Stop   10/10/22 0600  cefoTEtan (CEFOTAN) 2 g in sodium chloride 0.9 % 100 mL IVPB        2 g 200 mL/hr over 30 Minutes Intravenous On call to O.R. 10/09/22 1623 10/11/22 0700   10/07/22 1615  trimethoprim (TRIMPEX) tablet 100 mg        100 mg Oral Daily 10/07/22 1605         Assessment/Plan: S/p lap assisted partial colectomy  PoD#3  Will try full liquids Encourage her to get up and out of bed Awaiting path  LOS: 5 days    Abigail Miyamoto 10/13/2022

## 2022-10-13 NOTE — TOC Progression Note (Signed)
Transition of Care [facility]) - Progression Note    Patient Details  Name: Kerri [name] MRN: [id number] Date of Birth: Jun 12, 1929  Transition of Care [facility]) CM/SW Contact  Beckie Busing, RN Phone [phone]  10/13/2022, 10:14 AM  Clinical Narrative:    TOC continues to follow for palliative care screen. CM at bedside introduced sel and explained role and consult. Patient states that she does not want palliative to follow her outpatient. Son in law at bedside to further explain, patient still saying she does not want this service. Patient gives CM verbal consent to speak with her daughter Kerri [name]. CM called daughter Kerri [name] to discuss consult  daughter is in agreement but states that she is planning to take patient to [facility] and she does not have an agency in mind. Daughter states that patient is no where near being ready for home. CM has explained that consult has been received and that CM is trying to get discharge plan in place prior to d/ c. Daughter is not certain of the disposition plan. CM will hold off on home health and outpatient palliative for now. TOC will continue to follow.    Expected Discharge Plan: Home/Self Care Barriers to Discharge: Continued Medical Work up  Expected Discharge Plan and Services       Living arrangements for the past 2 months: Independent Living Facility [facility])                                       Social Determinants of Health (SDOH) Interventions SDOH Screenings   Food Insecurity: No Food Insecurity (10/08/2022)  Housing: Low Risk  (10/08/2022)  Transportation Needs: No Transportation Needs (10/08/2022)  Utilities: Not At Risk (10/08/2022)  Tobacco Use: Medium Risk (10/11/2022)    Readmission Risk Interventions    10/13/2022   10:13 AM 09/11/2021    2:25 PM  Readmission Risk Prevention Plan  Transportation Screening Complete Complete  PCP or Specialist Appt within 5-7 Days Complete   PCP or Specialist Appt within 3-5 Days   Complete  Home Care Screening Complete   Medication Review (RN CM) Complete   HRI or Home Care Consult  Complete  Social Work Consult for Recovery Care Planning/Counseling  Complete  Palliative Care Screening  Complete

## 2022-10-13 NOTE — Progress Notes (Signed)
A great deal of encouragement and then patient was assisted to bedside commode.  She used angry and degrading language the whole time, but did successfully have a soft BM.  Getting up slowly did cause nausea and some vomiting.  Zofran given.  Assisted back to bed. Deep breathing encouraged.

## 2022-10-13 NOTE — Care Management Important Message (Signed)
Important Message  Patient Details IM Letter given Name: Kerri [name] MRN: [id number] Date of Birth: [birth year]   Medicare Important Message Given:  Yes     Caren Macadam 10/13/2022, 12:59 PM

## 2022-10-14 DIAGNOSIS — K639 Disease of intestine, unspecified: Secondary | ICD-10-CM | POA: Diagnosis not present

## 2022-10-14 LAB — CBC
HCT: 29.4 % — ABNORMAL LOW (ref 36.0–46.0)
HCT: 28.7 % — ABNORMAL LOW (ref 36.0–46.0)
Hemoglobin: 9.3 g/dL — ABNORMAL LOW (ref 12.0–15.0)
Hemoglobin: 9.3 g/dL — ABNORMAL LOW (ref 12.0–15.0)
MCH: 28.4 pg (ref 26.0–34.0)
MCH: 29.4 pg (ref 26.0–34.0)
MCHC: 31.6 g/dL (ref 30.0–36.0)
MCHC: 32.4 g/dL (ref 30.0–36.0)
MCV: 89.6 fL (ref 80.0–100.0)
MCV: 90.8 fL (ref 80.0–100.0)
Platelets: 299 10*3/uL (ref 150–400)
Platelets: 286 10*3/uL (ref 150–400)
RBC: 3.28 MIL/uL — ABNORMAL LOW (ref 3.87–5.11)
RBC: 3.16 MIL/uL — ABNORMAL LOW (ref 3.87–5.11)
RDW: 20.5 % — ABNORMAL HIGH (ref 11.5–15.5)
RDW: 21.2 % — ABNORMAL HIGH (ref 11.5–15.5)
WBC: 7.4 10*3/uL (ref 4.0–10.5)
WBC: 5.4 10*3/uL (ref 4.0–10.5)
nRBC: 0 % (ref 0.0–0.2)
nRBC: 0 % (ref 0.0–0.2)

## 2022-10-14 LAB — BASIC METABOLIC PANEL
Anion gap: 5 (ref 5–15)
BUN: 20 mg/dL (ref 8–23)
CO2: 25 mmol/L (ref 22–32)
Calcium: 7.9 mg/dL — ABNORMAL LOW (ref 8.9–10.3)
Chloride: 105 mmol/L (ref 98–111)
Creatinine, Ser: 0.55 mg/dL (ref 0.44–1.00)
GFR, Estimated: >60 mL/min (ref >60)
Glucose, Bld: 119 mg/dL — ABNORMAL HIGH (ref 70–99)
Potassium: 3.5 mmol/L (ref 3.5–5.1)
Sodium: 135 mmol/L (ref 135–145)

## 2022-10-14 MED ORDER — PHENOL 1.4 % MT LIQD
1.0000 | OROMUCOSAL | Status: DC | PRN
  Administered 2022-10-14: 1 via OROMUCOSAL
  Filled 2022-10-14: qty 177

## 2022-10-14 NOTE — Progress Notes (Signed)
4 Days Post-Op   Subjective/Chief Complaint: Multiple loose bm's this morning with some old blood Denies abdominal pain Has not been up ambulating    Objective: Vital signs in last 24 hours: Temp:  [98.2 F (36.8 C)-98.4 F (36.9 C)] 98.4 F (36.9 C) (05/25 0443) Pulse Rate:  [80-84] 80 (05/25 0443) Resp:  [18-20] 18 (05/25 0443) BP: (136-167)/(87-91) 136/91 (05/25 0443) SpO2:  [94 %-96 %] 96 % (05/25 0443) Last BM Date : 10/09/22 (she had a colonoscopy on 5/20)  Intake/Output from previous day: No intake/output data recorded. Intake/Output this shift: No intake/output data recorded.  Exam: Awake and alert Abdomen soft, dressing dry Loose bm looks like some old blood  Lab Results:  Recent Labs    10/12/22 0621 10/14/22 0707  WBC 10.9* 7.4  HGB 11.2* 9.3*  HCT 35.2* 29.4*  PLT 286 299   BMET Recent Labs    10/12/22 0621 10/14/22 0707  NA 136 135  K 3.7 3.5  CL 102 105  CO2 25 25  GLUCOSE 130* 119*  BUN 11 20  CREATININE 0.69 0.55  CALCIUM 8.6* 7.9*   PT/INR No results for input(s): "LABPROT", "INR" in the last 72 hours. ABG No results for input(s): "PHART", "HCO3" in the last 72 hours.  Invalid input(s): "PCO2", "PO2"  Studies/Results: No results found.  Anti-infectives: Anti-infectives (From admission, onward)    Start     Dose/Rate Route Frequency Ordered Stop   10/10/22 0600  cefoTEtan (CEFOTAN) 2 g in sodium chloride 0.9 % 100 mL IVPB        2 g 200 mL/hr over 30 Minutes Intravenous On call to O.R. 10/09/22 1623 10/11/22 0700   10/07/22 1615  trimethoprim (TRIMPEX) tablet 100 mg        100 mg Oral Daily 10/07/22 1605         Assessment/Plan: S/p lap assisted partial colectomy for colon cancer  POD#4  Hgb down some.  Suspect some bleeding from anastomosis.  Stopping lovenox.  She is currently hemodynamically stable. Will repeat Hgb later today. Will also try a regular diet I do not want to try imodium yet as her bowels yet for  fear of creating an ileus.  Pathology still pending    Kerri [name] 10/14/2022

## 2022-10-14 NOTE — Progress Notes (Signed)
Physical Therapy Treatment Patient Details Name: Kerri [name] MRN: [id number] DOB: 1929/06/13 Today's Date: 10/14/2022   History of Present Illness Pt is 89 yo female on 10/07/22 with colonic neoplasm s/p laparoscopic assisted ileocolectomy on 10/10/22.  Pt with hx including colonic polyps, asthma, basal cell carcinoma of the left forehead, coronary artery disease, depression/anxiety, diverticulosis, hyperlipidemia, fatty liver disease, kidney cancer s/p ablation done in [facility] ,hypertension, hypothyroidism    PT Comments    Pt assisted to bathroom however incontinent of bowels.  After pericare and cleaning, pt assisted to recliner.  Pt performed seated marching, LAQs and ankle pumps bilaterally x10 and then fatigued.  Pt requested to not be left in recliner, so assisted pt with transfer back to bed.  Pt requiring assist however also frequently demanding assist with mobility but does appear capable of performing.  Bowel incontinence limiting her mobility today as well.    Recommendations for follow up therapy are one component of a multi-disciplinary discharge planning process, led by the attending physician.  Recommendations may be updated based on patient status, additional functional criteria and insurance authorization.  Follow Up Recommendations       Assistance Recommended at Discharge Frequent or constant Supervision/Assistance  Patient can return home with the following A little help with walking and/or transfers;A little help with bathing/dressing/bathroom   Equipment Recommendations  None recommended by PT    Recommendations for Other Services       Precautions / Restrictions Precautions Precautions: Fall Precaution Comments: incontinent loose stools 5/25     Mobility  Bed Mobility Overal bed mobility: Needs Assistance Bed Mobility: Supine to Sit, Sit to Supine     Supine to sit: Min assist Sit to supine: Min assist   General bed mobility comments: requesting assist to  perform however appears able to perform on her own    Transfers Overall transfer level: Needs assistance Equipment used: Rolling walker (2 wheels) Transfers: Sit to/from Stand Sit to Stand: Min assist, Mod assist           General transfer comment: again requests assists ("help me under my arms"), did not appear receptive to cues for self assist; performed from bed, toilet and recliner surfaces    Ambulation/Gait Ambulation/Gait assistance: Min guard Gait Distance (Feet): 6 Feet Assistive device: Rolling walker (2 wheels) Gait Pattern/deviations: Step-through pattern, Decreased stride length       General Gait Details: ambulated to bathroom, min/guard for safety, pt incontinent of bowels and depends not able to contain BM (splattering all over), pt assisted to toilet for hygiene and cleaning before able to safely exit bathroom; ambulated into bathroom and then to recliner pulled close to door in case of another BM episode   Stairs             Wheelchair Mobility    Modified Rankin (Stroke Patients Only)       Balance Overall balance assessment: Needs assistance         Standing balance support: No upper extremity supported Standing balance-Leahy Scale: Fair Standing balance comment: static fair, dynamic poor                            Cognition Arousal/Alertness: Awake/alert Behavior During Therapy: WFL for tasks assessed/performed Overall Cognitive Status: Within Functional Limits for tasks assessed  General Comments: particular        Exercises      General Comments        Pertinent Vitals/Pain Pain Assessment Pain Assessment: Faces Faces Pain Scale: Hurts little more Pain Location: abdomen Pain Descriptors / Indicators: Guarding, Discomfort Pain Intervention(s): Repositioned, Monitored during session    Home Living                          Prior Function             PT Goals (current goals can now be found in the care plan section) Progress towards PT goals: Progressing toward goals    Frequency    Min 1X/week      PT Plan Current plan remains appropriate    Co-evaluation              AM-PAC PT "6 Clicks" Mobility   Outcome Measure  Help needed turning from your back to your side while in a flat bed without using bedrails?: A Little Help needed moving from lying on your back to sitting on the side of a flat bed without using bedrails?: A Little Help needed moving to and from a bed to a chair (including a wheelchair)?: A Little Help needed standing up from a chair using your arms (e.g., wheelchair or bedside chair)?: A Little Help needed to walk in hospital room?: A Little Help needed climbing 3-5 steps with a railing? : A Lot 6 Click Score: 17    End of Session Equipment Utilized During Treatment: Gait belt Activity Tolerance: Patient tolerated treatment well Patient left: in bed;with call bell/phone within reach;with bed alarm set Nurse Communication: Mobility status PT Visit Diagnosis: Other abnormalities of gait and mobility (R26.89);Muscle weakness (generalized) (M62.81)     Time: 1610-9604 PT Time Calculation (min) (ACUTE ONLY): 48 min  Charges:  $Gait Training: 8-22 mins $Therapeutic Activity: 23-37 mins                    Paulino Door, DPT Physical Therapist Acute Rehabilitation Services Office: [phone]   Kerri [name] 10/14/2022, 3:38 PM

## 2022-10-14 NOTE — Progress Notes (Signed)
PROGRESS NOTE  Kerri [name] ZOX:[id number] DOB: 06/12/1929 DOA: 10/07/2022 PCP: Delma Officer, PA   LOS: 6 days   Brief Narrative / Interim history: 89 year old female with history of colonic polyps, asthma, basal cell carcinoma of the left forehead, coronary artery disease, depression/anxiety, diverticulosis, hyperlipidemia, fatty liver disease, kidney cancer s/p ablation done in [facility] ,hypertension, hypothyroidism who was brought from [facility] for further evaluation of nausea, decreased oral intake, vomiting,wight loss, black stool. On presentation, lab work showed hemoglobin of 12. CT abdomen/pelvis showed circumferential wall thickening of the ascending colon with distention of the cecum, few enlarged mesenteric lymph nodes. GI consulted for further evaluation. Colonoscopy done on 5/20 showed partially obstructing ascending colon mass concerning for primary colon cancer. Chest CT has not shown any signs of metastatic disease. General surgery did laparoscopic assisted ileocolectomy on 5/21, pathology pending   Subjective / 24h Interval events: Started having bloody bowel movements this morning.  Assesement and Plan: Principal Problem:   Lesion of colon Active Problems:   Hypothyroidism   Hyperlipidemia   Essential hypertension   Coronary atherosclerosis   History of recurrent UTIs   Grade I diastolic dysfunction   Abnormal CT of the abdomen   Generalized abdominal pain   Loss of weight   Nausea and vomiting   Colonic mass   Benign neoplasm of colon   Palliative care encounter   Goals of care, counseling/discussion   Need for emotional support   Counseling and coordination of care   Principal problem Abnormal CT findings /Colonic neoplasm - Presented with decreased oral intake, vomiting, black stool, weight loss.  CT abdomen/pelvis showed circumferential wall thickening in the ascending colon with distention of cecum and ascending colon proximal to this lesion. Colonoscopy  done on 5/20 showed partially obstructing ascending colon mass concerning for primary colon cancer.  Biopsies showed superficial fragments of tubular adenoma with extensive high-grade dysplasia / intramucosal carcinoma suspicious for invasion.  Chest CT has not shown any signs of metastatic disease.  CEA normal.   General surgery did laparoscopic assisted ileocolectomy on 5/21 -Started having bowel movements this morning, and there was evidence of old blood in them.  Discussed with Dr. Magnus Ivan, suspect some bleeding from anastomosis.  Hold Lovenox -Advance diet   Active problems Normocytic anemia -monitor since she is having some bleeding.  Hemoglobin 9.3 today   Hypothyroidism - Continue Synthyroid   Hyperlipidemia - Continue rosuvastatin   Hypertension - Continue metoprolol   Coronary artery disease - No anginal symptoms.  Continue beta-blocker, statin   Grade 1 diastolic dysfunction - Follows with cardiology as an outpatient.  Currently euvolemic   Recurrent UTI - On trimethoprim   Disposition - Lives at [facility].  Toc consulted.  PT recommended home health   Goals of care - Elderly patient with suspicion for colonic neoplasm.  CODE STATUS DNR.  History of kidney cancer status post ablation at [facility] 4 years ago. Palliative care was following.    Scheduled Meds:  acetaminophen  1,000 mg Oral TID   cyanocobalamin  500 mcg Oral Daily   fluticasone  2 spray Each Nare Daily   gabapentin  100 mg Oral BID   levothyroxine  75 mcg Oral Q breakfast   lidocaine  1 patch Transdermal Q24H   loratadine  10 mg Oral Daily   metoprolol succinate  12.5 mg Oral Daily   rosuvastatin  10 mg Oral QHS   tapentadol  50 mg Oral BID   trimethoprim  100 mg Oral Daily  Continuous Infusions:  sodium chloride 50 mL/hr at 10/13/22 1722   PRN Meds:.busPIRone, HYDROmorphone (DILAUDID) injection, methocarbamol, ondansetron **OR** ondansetron (ZOFRAN) IV, phenol, prochlorperazine  Current Outpatient  Medications  Medication Instructions   alendronate (FOSAMAX) 70 mg, Oral, Every Sun   Biotin 5 mg, Oral, Daily   calcium carbonate (OS-CAL) 1250 (500 Ca) MG chewable tablet 2 tablets, Daily   clobetasol (OLUX) 0.05 % topical foam Topical, 2 times daily   collagenase (SANTYL) 250 UNIT/GM ointment Topical, Daily, Apply to sacral Unstageable Pressure injury in a 1/8 inch layer once daily and PRN soiling of dressing indivating replacement   cyanocobalamin (VITAMIN B12) 500 mcg, Oral, Daily   ferrous sulfate 325 mg, Oral, Daily with breakfast   Flurandrenolide (CORDRAN) 4 MCG/SQCM TAPE 1 each, Apply externally, Daily PRN   fluticasone (FLONASE) 50 MCG/ACT nasal spray 2 sprays, Each Nare, BH-each morning   folic acid (FOLVITE) 1 mg, Oral, Daily   gabapentin (NEURONTIN) 100 mg, Oral, 2 times daily   levothyroxine (SYNTHROID) 75 mcg, Oral, Daily with breakfast   loratadine (CLARITIN) 10 mg, Oral, Daily   metoprolol succinate (TOPROL-XL) 12.5 mg, Oral, Daily   Nucynta 50 mg, Oral, 2 times daily   rosuvastatin (CRESTOR) 10 mg, Oral, Daily at bedtime   Sodium Sulfate-Mag Sulfate-KCl (SUTAB) [phone] MG TABS Use as directed for colonoscopy. MANUFACTURER CODES!! BIN: [id number] PCN: [id number] GROUP: [id number] MEMBER ID: [id number];[employer] AS SECONDARY INSURANCE ;NO PRIOR AUTHORIZATION   trimethoprim (TRIMPEX) 100 mg, Oral, Daily   Vitamin D3 2,000 Units, Oral, Daily    Diet Orders (From admission, onward)     Start     Ordered   10/14/22 0859  Diet regular Room service appropriate? Yes; Fluid consistency: Thin  Diet effective now       Question Answer Comment  Room service appropriate? Yes   Fluid consistency: Thin      10/14/22 0901            DVT prophylaxis: SCDs Start: 10/07/22 1553   Lab Results  Component Value Date   PLT 299 10/14/2022      Code Status: DNR  Family Communication: no family at bedside   Status is: Inpatient Remains inpatient appropriate because: severity of  illness   Level of care: Med-Surg  Consultants:  General surgery   Objective: Vitals:   10/12/22 2033 10/13/22 0612 10/13/22 1352 10/14/22 0443  BP: 132/87 (!) 169/92 (!) 167/87 (!) 136/91  Pulse: 92 92 84 80  Resp: 18 18 20 18   Temp: 98 F (36.7 C) 98.3 F (36.8 C) 98.2 F (36.8 C) 98.4 F (36.9 C)  TempSrc: Oral Oral Oral Oral  SpO2: 96% 94% 94% 96%  Weight:      Height:       No intake or output data in the 24 hours ending 10/14/22 1038 Wt Readings from Last 3 Encounters:  10/10/22 54 kg  07/17/22 57.2 kg  02/07/22 52.6 kg    Examination:  Constitutional: NAD Eyes: no scleral icterus ENMT: Mucous membranes are moist.  Neck: normal, supple Respiratory: clear to auscultation bilaterally, no wheezing, no crackles. Normal respiratory effort. No accessory muscle use.  Cardiovascular: Regular rate and rhythm, no murmurs / rubs / gallops. Abdomen: non distended, no tenderness. Bowel sounds positive.  Musculoskeletal: no clubbing / cyanosis.   Data Reviewed: I have independently reviewed following labs and imaging studies   CBC Recent Labs  Lab 10/07/22 1139 10/09/22 0534 10/10/22 0556 10/11/22 0612 10/12/22 0621 10/14/22 0707  WBC 8.4 5.7  6.8 11.7* 10.9* 7.4  HGB 12.0 10.4* 9.3* 10.4* 11.2* 9.3*  HCT 37.6 33.9* 30.2* 31.8* 35.2* 29.4*  PLT 284 220 186 253 286 299  MCV 84.7 87.4 89.6 93.8 87.6 89.6  MCH 27.0 26.8 27.6 30.7 27.9 28.4  MCHC 31.9 30.7 30.8 32.7 31.8 31.6  RDW 19.9* 19.7* 19.9* 23.2* 20.4* 20.5*  LYMPHSABS 2.1  --   --   --   --   --   MONOABS 0.7  --   --   --   --   --   EOSABS 0.0  --   --   --   --   --   BASOSABS 0.0  --   --   --   --   --     Recent Labs  Lab 10/07/22 1139 10/09/22 0533 10/09/22 0534 10/10/22 0556 10/11/22 0612 10/12/22 0621 10/14/22 0707  NA 135  --  139 135 135 136 135  K 3.9  --  3.4* 3.6 4.1 3.7 3.5  CL 102  --  106 104 102 102 105  CO2 23  --  25 23 25 25 25   GLUCOSE 107*  --  97 84 123* 130* 119*   BUN 11  --  13 8 10 11 20   CREATININE 0.67  --  0.68 0.71 0.65 0.69 0.55  CALCIUM 9.1  --  8.4* 8.2* 8.4* 8.6* 7.9*  AST 14*  --   --   --   --   --   --   ALT 10  --   --   --   --   --   --   ALKPHOS 74  --   --   --   --   --   --   BILITOT 0.6  --   --   --   --   --   --   ALBUMIN 3.7  --   --   --   --   --   --   HGBA1C  --  5.3  --   --   --   --   --     ------------------------------------------------------------------------------------------------------------------ No results for input(s): "CHOL", "HDL", "LDLCALC", "TRIG", "CHOLHDL", "LDLDIRECT" in the last 72 hours.  Lab Results  Component Value Date   HGBA1C 5.3 10/09/2022   ------------------------------------------------------------------------------------------------------------------ No results for input(s): "TSH", "T4TOTAL", "T3FREE", "THYROIDAB" in the last 72 hours.  Invalid input(s): "FREET3"  Cardiac Enzymes No results for input(s): "CKMB", "TROPONINI", "MYOGLOBIN" in the last 168 hours.  Invalid input(s): "CK" ------------------------------------------------------------------------------------------------------------------ No results found for: "BNP"  CBG: No results for input(s): "GLUCAP" in the last 168 hours.  Recent Results (from the past 240 hour(s))  Surgical pcr screen     Status: None   Collection Time: 10/10/22 11:25 AM   Specimen: Nasal Mucosa; Nasal Swab  Result Value Ref Range Status   MRSA, PCR NEGATIVE NEGATIVE Final   Staphylococcus aureus NEGATIVE NEGATIVE Final    Comment: (NOTE) The Xpert SA Assay (FDA approved for NASAL specimens in patients 14 years of age and older), is one component of a comprehensive surveillance program. It is not intended to diagnose infection nor to guide or monitor treatment. Performed at [facility], [address], [place], [place] [address]      Radiology Studies: No results found.   Pamella Pert, MD, PhD [facility]  Between 7 am - 7 pm I am available, please contact me via Amion (for emergencies)  or Securechat (non urgent messages)  Between 7 pm - 7 am I am not available, please contact night coverage MD/APP via Amion

## 2022-10-15 DIAGNOSIS — K639 Disease of intestine, unspecified: Secondary | ICD-10-CM | POA: Diagnosis not present

## 2022-10-15 LAB — COMPREHENSIVE METABOLIC PANEL
ALT: 10 U/L (ref 0–44)
AST: 12 U/L — ABNORMAL LOW (ref 15–41)
Albumin: 2.9 g/dL — ABNORMAL LOW (ref 3.5–5.0)
Alkaline Phosphatase: 47 U/L (ref 38–126)
Anion gap: 9 (ref 5–15)
BUN: 12 mg/dL (ref 8–23)
CO2: 22 mmol/L (ref 22–32)
Calcium: 8.1 mg/dL — ABNORMAL LOW (ref 8.9–10.3)
Chloride: 104 mmol/L (ref 98–111)
Creatinine, Ser: 0.64 mg/dL (ref 0.44–1.00)
GFR, Estimated: >60 mL/min (ref >60)
Glucose, Bld: 122 mg/dL — ABNORMAL HIGH (ref 70–99)
Potassium: 3.1 mmol/L — ABNORMAL LOW (ref 3.5–5.1)
Sodium: 135 mmol/L (ref 135–145)
Total Bilirubin: 0.5 mg/dL (ref 0.3–1.2)
Total Protein: 6 g/dL — ABNORMAL LOW (ref 6.5–8.1)

## 2022-10-15 LAB — CBC
HCT: 30 % — ABNORMAL LOW (ref 36.0–46.0)
Hemoglobin: 9.5 g/dL — ABNORMAL LOW (ref 12.0–15.0)
MCH: 27.8 pg (ref 26.0–34.0)
MCHC: 31.7 g/dL (ref 30.0–36.0)
MCV: 87.7 fL (ref 80.0–100.0)
Platelets: 328 10*3/uL (ref 150–400)
RBC: 3.42 MIL/uL — ABNORMAL LOW (ref 3.87–5.11)
RDW: 19.9 % — ABNORMAL HIGH (ref 11.5–15.5)
WBC: 6.1 10*3/uL (ref 4.0–10.5)
nRBC: 0 % (ref 0.0–0.2)

## 2022-10-15 LAB — MAGNESIUM: Magnesium: 1.8 mg/dL (ref 1.7–2.4)

## 2022-10-15 MED ORDER — POTASSIUM CHLORIDE CRYS ER 20 MEQ PO TBCR
40.0000 meq | EXTENDED_RELEASE_TABLET | Freq: Once | ORAL | Status: AC
  Administered 2022-10-15: 40 meq via ORAL
  Filled 2022-10-15: qty 2

## 2022-10-15 MED ORDER — CALCIUM POLYCARBOPHIL 625 MG PO TABS
1250.0000 mg | ORAL_TABLET | Freq: Two times a day (BID) | ORAL | Status: DC
  Administered 2022-10-15: 1250 mg via ORAL
  Filled 2022-10-15 (×4): qty 2

## 2022-10-15 NOTE — Progress Notes (Signed)
Pt is refusing all oral medication for this shift.  Explained risk and pt expresses understanding.  Will continue to monitor.

## 2022-10-15 NOTE — Progress Notes (Signed)
PROGRESS NOTE  Kerri [name] ZOX:[id number] DOB: 1929/05/28 DOA: 10/07/2022 PCP: Delma Officer, PA   LOS: 7 days   Brief Narrative / Interim history: 89 year old female with history of colonic polyps, asthma, basal cell carcinoma of the left forehead, coronary artery disease, depression/anxiety, diverticulosis, hyperlipidemia, fatty liver disease, kidney cancer s/p ablation done in [facility] ,hypertension, hypothyroidism who was brought from [facility] for further evaluation of nausea, decreased oral intake, vomiting,wight loss, black stool. On presentation, lab work showed hemoglobin of 12. CT abdomen/pelvis showed circumferential wall thickening of the ascending colon with distention of the cecum, few enlarged mesenteric lymph nodes. GI consulted for further evaluation. Colonoscopy done on 5/20 showed partially obstructing ascending colon mass concerning for primary colon cancer. Chest CT has not shown any signs of metastatic disease. General surgery did laparoscopic assisted ileocolectomy on 5/21, pathology pending   Subjective / 24h Interval events: Still with loose stools, dark brown per nurse  Assesement and Plan: Principal Problem:   Lesion of colon Active Problems:   Hypothyroidism   Hyperlipidemia   Essential hypertension   Coronary atherosclerosis   History of recurrent UTIs   Grade I diastolic dysfunction   Abnormal CT of the abdomen   Generalized abdominal pain   Loss of weight   Nausea and vomiting   Colonic mass   Benign neoplasm of colon   Palliative care encounter   Goals of care, counseling/discussion   Need for emotional support   Counseling and coordination of care   Principal problem Abnormal CT findings /Colonic neoplasm - Presented with decreased oral intake, vomiting, black stool, weight loss.  CT abdomen/pelvis showed circumferential wall thickening in the ascending colon with distention of cecum and ascending colon proximal to this lesion. Colonoscopy done on  5/20 showed partially obstructing ascending colon mass concerning for primary colon cancer.  Biopsies showed superficial fragments of tubular adenoma with extensive high-grade dysplasia / intramucosal carcinoma suspicious for invasion.  Chest CT has not shown any signs of metastatic disease.  CEA normal.   General surgery did laparoscopic assisted ileocolectomy on 5/21 -Started having dark brown bloody bowel movement since 5/25.  Suspect some bleeding from anastomosis.  Hemoglobin actually stable and increasing today -Tolerating a regular diet   Active problems Normocytic anemia -monitor since she is having some bleeding.  Hemoglobin 9.5 today   Hypothyroidism - Continue Synthyroid   Hyperlipidemia - Continue rosuvastatin   Hypertension - Continue metoprolol   Coronary artery disease - No anginal symptoms.  Continue beta-blocker, statin   Grade 1 diastolic dysfunction - Follows with cardiology as an outpatient.  Currently euvolemic   Recurrent UTI - On trimethoprim   Disposition - Lives at ILF.  Toc consulted.  PT recommended home health   Goals of care - Elderly patient with suspicion for colonic neoplasm.  CODE STATUS DNR.  History of kidney cancer status post ablation at [facility] 4 years ago. Palliative care was following.    Scheduled Meds:  acetaminophen  1,000 mg Oral TID   cyanocobalamin  500 mcg Oral Daily   fluticasone  2 spray Each Nare Daily   gabapentin  100 mg Oral BID   levothyroxine  75 mcg Oral Q breakfast   lidocaine  1 patch Transdermal Q24H   loratadine  10 mg Oral Daily   metoprolol succinate  12.5 mg Oral Daily   polycarbophil  1,250 mg Oral BID   potassium chloride  40 mEq Oral Once   rosuvastatin  10 mg Oral QHS  tapentadol  50 mg Oral BID   trimethoprim  100 mg Oral Daily   Continuous Infusions:  sodium chloride 50 mL/hr at 10/15/22 1016   PRN Meds:.busPIRone, HYDROmorphone (DILAUDID) injection, methocarbamol, ondansetron **OR** ondansetron (ZOFRAN) IV,  phenol, prochlorperazine  Current Outpatient Medications  Medication Instructions   alendronate (FOSAMAX) 70 mg, Oral, Every Sun   Biotin 5 mg, Oral, Daily   calcium carbonate (OS-CAL) 1250 (500 Ca) MG chewable tablet 2 tablets, Daily   clobetasol (OLUX) 0.05 % topical foam Topical, 2 times daily   collagenase (SANTYL) 250 UNIT/GM ointment Topical, Daily, Apply to sacral Unstageable Pressure injury in a 1/8 inch layer once daily and PRN soiling of dressing indivating replacement   cyanocobalamin (VITAMIN B12) 500 mcg, Oral, Daily   ferrous sulfate 325 mg, Oral, Daily with breakfast   Flurandrenolide (CORDRAN) 4 MCG/SQCM TAPE 1 each, Apply externally, Daily PRN   fluticasone (FLONASE) 50 MCG/ACT nasal spray 2 sprays, Each Nare, BH-each morning   folic acid (FOLVITE) 1 mg, Oral, Daily   gabapentin (NEURONTIN) 100 mg, Oral, 2 times daily   levothyroxine (SYNTHROID) 75 mcg, Oral, Daily with breakfast   loratadine (CLARITIN) 10 mg, Oral, Daily   metoprolol succinate (TOPROL-XL) 12.5 mg, Oral, Daily   Nucynta 50 mg, Oral, 2 times daily   rosuvastatin (CRESTOR) 10 mg, Oral, Daily at bedtime   Sodium Sulfate-Mag Sulfate-KCl (SUTAB) 239 091 7326 MG TABS Use as directed for colonoscopy. MANUFACTURER CODES!! BIN: [id number] PCN: CN GROUP: [id number] MEMBER ID: [id number];V[employer] AS SECONDARY INSURANCE ;NO PRIOR AUTHORIZATION   trimethoprim (TRIMPEX) 100 mg, Oral, Daily   Vitamin D3 2,000 Units, Oral, Daily    Diet Orders (From admission, onward)     Start     Ordered   10/14/22 0859  Diet regular Room service appropriate? Yes; Fluid consistency: Thin  Diet effective now       Question Answer Comment  Room service appropriate? Yes   Fluid consistency: Thin      10/14/22 0901            DVT prophylaxis: SCDs Start: 10/07/22 1553   Lab Results  Component Value Date   PLT 328 10/15/2022      Code Status: DNR  Family Communication: no family at bedside   Status is: Inpatient Remains  inpatient appropriate because: severity of illness   Level of care: Med-Surg  Consultants:  General surgery   Objective: Vitals:   10/14/22 0443 10/14/22 1344 10/14/22 2115 10/15/22 0648  BP: (!) 136/91 131/69 (!) 156/83 (!) 162/84  Pulse: 80 79 77 81  Resp: 18 18 16 18   Temp: 98.4 F (36.9 C) 98.3 F (36.8 C) 98.2 F (36.8 C) 99.7 F (37.6 C)  TempSrc: Oral Oral Oral Oral  SpO2: 96% 97% 97% 96%  Weight:      Height:        Intake/Output Summary (Last 24 hours) at 10/15/2022 1056 Last data filed at 10/15/2022 0913 Gross per 24 hour  Intake 1306.14 ml  Output --  Net 1306.14 ml   Wt Readings from Last 3 Encounters:  10/10/22 54 kg  07/17/22 57.2 kg  02/07/22 52.6 kg    Examination:  Constitutional: NAD Eyes: lids and conjunctivae normal, no scleral icterus ENMT: mmm Neck: normal, supple Respiratory: clear to auscultation bilaterally, no wheezing, no crackles. Cardiovascular: Regular rate and rhythm, no murmurs / rubs / gallops. No LE edema. Abdomen: soft, no distention, no tenderness. Bowel sounds positive.   Data Reviewed: I have independently reviewed following labs  and imaging studies   CBC Recent Labs  Lab 10/11/22 0612 10/12/22 0621 10/14/22 0707 10/14/22 1547 10/15/22 0909  WBC 11.7* 10.9* 7.4 5.4 6.1  HGB 10.4* 11.2* 9.3* 9.3* 9.5*  HCT 31.8* 35.2* 29.4* 28.7* 30.0*  PLT 253 286 299 286 328  MCV 93.8 87.6 89.6 90.8 87.7  MCH 30.7 27.9 28.4 29.4 27.8  MCHC 32.7 31.8 31.6 32.4 31.7  RDW 23.2* 20.4* 20.5* 21.2* 19.9*     Recent Labs  Lab 10/09/22 0533 10/09/22 0534 10/10/22 0556 10/11/22 0612 10/12/22 0621 10/14/22 0707 10/15/22 0909  NA  --    < > 135 135 136 135 135  K  --    < > 3.6 4.1 3.7 3.5 3.1*  CL  --    < > 104 102 102 105 104  CO2  --    < > 23 25 25 25 22   GLUCOSE  --    < > 84 123* 130* 119* 122*  BUN  --    < > 8 10 11 20 12   CREATININE  --    < > 0.71 0.65 0.69 0.55 0.64  CALCIUM  --    < > 8.2* 8.4* 8.6* 7.9* 8.1*   AST  --   --   --   --   --   --  12*  ALT  --   --   --   --   --   --  10  ALKPHOS  --   --   --   --   --   --  47  BILITOT  --   --   --   --   --   --  0.5  ALBUMIN  --   --   --   --   --   --  2.9*  MG  --   --   --   --   --   --  1.8  HGBA1C 5.3  --   --   --   --   --   --    < > = values in this interval not displayed.     ------------------------------------------------------------------------------------------------------------------ No results for input(s): "CHOL", "HDL", "LDLCALC", "TRIG", "CHOLHDL", "LDLDIRECT" in the last 72 hours.  Lab Results  Component Value Date   HGBA1C 5.3 10/09/2022   ------------------------------------------------------------------------------------------------------------------ No results for input(s): "TSH", "T4TOTAL", "T3FREE", "THYROIDAB" in the last 72 hours.  Invalid input(s): "FREET3"  Cardiac Enzymes No results for input(s): "CKMB", "TROPONINI", "MYOGLOBIN" in the last 168 hours.  Invalid input(s): "CK" ------------------------------------------------------------------------------------------------------------------ No results found for: "BNP"  CBG: No results for input(s): "GLUCAP" in the last 168 hours.  Recent Results (from the past 240 hour(s))  Surgical pcr screen     Status: None   Collection Time: 10/10/22 11:25 AM   Specimen: Nasal Mucosa; Nasal Swab  Result Value Ref Range Status   MRSA, PCR NEGATIVE NEGATIVE Final   Staphylococcus aureus NEGATIVE NEGATIVE Final    Comment: (NOTE) The Xpert SA Assay (FDA approved for NASAL specimens in patients 59 years of age and older), is one component of a comprehensive surveillance program. It is not intended to diagnose infection nor to guide or monitor treatment. Performed at [facility], [address] [place], [place] [address]      Radiology Studies: No results found.   Pamella Pert, MD, PhD [facility]  Between 7 am - 7 pm I  am available, please contact me via Amion (  for emergencies) or Securechat (non urgent messages)  Between 7 pm - 7 am I am not available, please contact night coverage MD/APP via Amion

## 2022-10-15 NOTE — Progress Notes (Signed)
5 Days Post-Op   Subjective/Chief Complaint: Moving bowels but having incontinence Pain controlled   Objective: Vital signs in last 24 hours: Temp:  [98.2 F (36.8 C)-99.7 F (37.6 C)] 99.7 F (37.6 C) (05/26 0648) Pulse Rate:  [77-81] 81 (05/26 0648) Resp:  [16-18] 18 (05/26 0648) BP: (131-162)/(69-84) 162/84 (05/26 0648) SpO2:  [96 %-97 %] 96 % (05/26 0648) Last BM Date : 10/14/22  Intake/Output from previous day: 05/25 0701 - 05/26 0700 In: 1066.1 [P.O.:120; I.V.:946.1] Out: -  Intake/Output this shift: Total I/O In: 120 [P.O.:120] Out: -   Exam: Awake and alert Abdomen soft, non-distended, almost no tenderness, dressing dry  Lab Results:  Recent Labs    10/14/22 0707 10/14/22 1547  WBC 7.4 5.4  HGB 9.3* 9.3*  HCT 29.4* 28.7*  PLT 299 286   BMET Recent Labs    10/14/22 0707  NA 135  K 3.5  CL 105  CO2 25  GLUCOSE 119*  BUN 20  CREATININE 0.55  CALCIUM 7.9*   PT/INR No results for input(s): "LABPROT", "INR" in the last 72 hours. ABG No results for input(s): "PHART", "HCO3" in the last 72 hours.  Invalid input(s): "PCO2", "PO2"  Studies/Results: No results found.  Anti-infectives: Anti-infectives (From admission, onward)    Start     Dose/Rate Route Frequency Ordered Stop   10/10/22 0600  cefoTEtan (CEFOTAN) 2 g in sodium chloride 0.9 % 100 mL IVPB        2 g 200 mL/hr over 30 Minutes Intravenous On call to O.R. 10/09/22 1623 10/11/22 0700   10/07/22 1615  trimethoprim (TRIMPEX) tablet 100 mg        100 mg Oral Daily 10/07/22 1605         Assessment/Plan: S/p lap assisted partial colectomy for colon cancer  POD#4  Patient fairly deconditioned Still want to try and hold any imodium if possible Pathology likely not back until Tuesday Labs pending   Abigail Miyamoto 10/15/2022

## 2022-10-16 DIAGNOSIS — K639 Disease of intestine, unspecified: Secondary | ICD-10-CM | POA: Diagnosis not present

## 2022-10-16 LAB — CBC
HCT: 30 % — ABNORMAL LOW (ref 36.0–46.0)
Hemoglobin: 9.9 g/dL — ABNORMAL LOW (ref 12.0–15.0)
MCH: 29.9 pg (ref 26.0–34.0)
MCHC: 33 g/dL (ref 30.0–36.0)
MCV: 90.6 fL (ref 80.0–100.0)
Platelets: 318 10*3/uL (ref 150–400)
RBC: 3.31 MIL/uL — ABNORMAL LOW (ref 3.87–5.11)
RDW: 21.3 % — ABNORMAL HIGH (ref 11.5–15.5)
WBC: 6.2 10*3/uL (ref 4.0–10.5)
nRBC: 0 % (ref 0.0–0.2)

## 2022-10-16 LAB — BASIC METABOLIC PANEL
Anion gap: 8 (ref 5–15)
BUN: 7 mg/dL — ABNORMAL LOW (ref 8–23)
CO2: 23 mmol/L (ref 22–32)
Calcium: 8 mg/dL — ABNORMAL LOW (ref 8.9–10.3)
Chloride: 103 mmol/L (ref 98–111)
Creatinine, Ser: 0.47 mg/dL (ref 0.44–1.00)
GFR, Estimated: >60 mL/min (ref >60)
Glucose, Bld: 111 mg/dL — ABNORMAL HIGH (ref 70–99)
Potassium: 2.9 mmol/L — ABNORMAL LOW (ref 3.5–5.1)
Sodium: 134 mmol/L — ABNORMAL LOW (ref 135–145)

## 2022-10-16 LAB — MAGNESIUM: Magnesium: 1.5 mg/dL — ABNORMAL LOW (ref 1.7–2.4)

## 2022-10-16 MED ORDER — MAGNESIUM SULFATE 2 GM/50ML IV SOLN
2.0000 g | Freq: Once | INTRAVENOUS | Status: AC
  Administered 2022-10-16: 2 g via INTRAVENOUS
  Filled 2022-10-16: qty 50

## 2022-10-16 MED ORDER — POTASSIUM CHLORIDE 20 MEQ PO PACK
40.0000 meq | PACK | ORAL | Status: DC
  Administered 2022-10-16: 40 meq via ORAL
  Filled 2022-10-16: qty 2

## 2022-10-16 MED ORDER — POTASSIUM CHLORIDE 10 MEQ/100ML IV SOLN
10.0000 meq | INTRAVENOUS | Status: AC
  Administered 2022-10-16 (×6): 10 meq via INTRAVENOUS
  Filled 2022-10-16 (×2): qty 100

## 2022-10-16 NOTE — Progress Notes (Signed)
6 Days Post-Op   Subjective/Chief Complaint: Patient refused the fiber pills yesterday reporting they were too large to swallow Had 2 bm's and better control of them Appetite still poor Denies nausea   Objective: Vital signs in last 24 hours: Temp:  [98.4 F (36.9 C)] 98.4 F (36.9 C) (05/26 2010) Pulse Rate:  [75-76] 75 (05/26 2010) Resp:  [18] 18 (05/26 2010) BP: (164-172)/(76-84) 172/84 (05/26 2010) SpO2:  [98 %-99 %] 98 % (05/26 2010) Last BM Date : 10/15/22  Intake/Output from previous day: 05/26 0701 - 05/27 0700 In: 240 [P.O.:240] Out: -  Intake/Output this shift: No intake/output data recorded.  Exam: Awake and alert Abdomen soft, dressing dry  Lab Results:  Recent Labs    10/15/22 0909 10/16/22 0512  WBC 6.1 6.2  HGB 9.5* 9.9*  HCT 30.0* 30.0*  PLT 328 318   BMET Recent Labs    10/15/22 0909 10/16/22 0512  NA 135 134*  K 3.1* 2.9*  CL 104 103  CO2 22 23  GLUCOSE 122* 111*  BUN 12 7*  CREATININE 0.64 0.47  CALCIUM 8.1* 8.0*   PT/INR No results for input(s): "LABPROT", "INR" in the last 72 hours. ABG No results for input(s): "PHART", "HCO3" in the last 72 hours.  Invalid input(s): "PCO2", "PO2"  Studies/Results: No results found.  Anti-infectives: Anti-infectives (From admission, onward)    Start     Dose/Rate Route Frequency Ordered Stop   10/10/22 0600  cefoTEtan (CEFOTAN) 2 g in sodium chloride 0.9 % 100 mL IVPB        2 g 200 mL/hr over 30 Minutes Intravenous On call to O.R. 10/09/22 1623 10/11/22 0700   10/07/22 1615  trimethoprim (TRIMPEX) tablet 100 mg        100 mg Oral Daily 10/07/22 1605         Assessment/Plan: S/p lap assisted partial colectomy for colon cancer  POD#5  Needs to continue to work with therapy for deconditioning Encourage po intake and being out of bed Awaiting pathology Hgb stable   Abigail Miyamoto MD 10/16/2022

## 2022-10-16 NOTE — Care Management Important Message (Signed)
Important Message  Patient Details IM Letter given. Name: Kerri [name] MRN: [id number] Date of Birth: 1929-08-31   Medicare Important Message Given:  Yes     Caren Macadam 10/16/2022, 11:17 AM

## 2022-10-16 NOTE — Progress Notes (Addendum)
  Daily Progress Note   Patient Name: Jillianna Jakubczak       Date: 10/16/2022 DOB: 11/22/1929  Age: 89 y.o. MRN#: [id number] Attending Physician: Leatha Gilding, MD Primary Care Physician: Delma Officer, [place] Admit Date: 10/07/2022 Length of Stay: 8 days   Daily Progress Note   Patient Name: Ashlea Allinger       Date: 10/16/2022 DOB: 20-Mar-1930  Age: 89 y.o. MRN#: [id number] Attending Physician: Leatha Gilding, MD Primary Care Physician: Delma Officer, [place] Admit Date: 10/07/2022 Length of Stay: 8 days  Patient last seen by this palliative provider on 10/10/22. Have been following along with patient's medical journey regarding new diagnosis of colonic mass obstruction with biopsies concerning for superficial fragments of tubular adenoma with extensive high-grade dysplasia / intramucosal carcinoma suspicious for invasion. Patient appropriately underwent lap assisted partial colectomy for colon cancer as functional status was good before this event. Awaiting further pathology to have oncology weigh in, potentially in the outpatient setting as patient needing to regain strength after surgery. PT/OT following.  Goals have remained clear at this time. Continuing with appropraite medical therapies. Patient already DNR. RN daughter very helpful in supporting patient. Already agreed to home palliative medicine referral which TOC has already been consulted to assist with coordinating.  Discussed care with primary hospitalist today. Palliative medicine will follow along peripherally. Please reach out if acute needs arise. Thank you.    UPDATE: Daughter stopped this provider later in hallway to discuss care. Awaiting pathology results. Daughter (who is an [employer] fundraiser) is very realistic regarding care moving forward. She noted that once biopsy results return, she will plan to talk to her mother about rehab/home palliative VS considering hospice. Acknowledged this and noted PMT if available if needed. Informed hospitalist  of this as well after conversation.   Alvester Morin, DO Palliative Care Provider PMT # [phone]

## 2022-10-16 NOTE — Progress Notes (Signed)
PROGRESS NOTE  Kerri [name] WUJ:[id number] DOB: [birth year] DOA: 10/07/2022 PCP: Delma Officer, PA   LOS: 8 days   Brief Narrative / Interim history: 89 year old female with history of colonic polyps, asthma, basal cell carcinoma of the left forehead, coronary artery disease, depression/anxiety, diverticulosis, hyperlipidemia, fatty liver disease, kidney cancer s/p ablation done in [facility] ,hypertension, hypothyroidism who was brought from [facility] for further evaluation of nausea, decreased oral intake, vomiting,wight loss, black stool. On presentation, lab work showed hemoglobin of 12. CT abdomen/pelvis showed circumferential wall thickening of the ascending colon with distention of the cecum, few enlarged mesenteric lymph nodes. GI consulted for further evaluation. Colonoscopy done on 5/20 showed partially obstructing ascending colon mass concerning for primary colon cancer. Chest CT has not shown any signs of metastatic disease. General surgery did laparoscopic assisted ileocolectomy on 5/21, pathology pending   Subjective / 24h Interval events: No significant overnight events.  Overall doing well this morning.  Diarrhea is slowing down.  She is able to eat without nausea or vomiting  Assesement and Plan: Principal Problem:   Lesion of colon Active Problems:   Hypothyroidism   Hyperlipidemia   Essential hypertension   Coronary atherosclerosis   History of recurrent UTIs   Grade I diastolic dysfunction   Abnormal CT of the abdomen   Generalized abdominal pain   Loss of weight   Nausea and vomiting   Colonic mass   Benign neoplasm of colon   Palliative care encounter   Goals of care, counseling/discussion   Need for emotional support   Counseling and coordination of care   Principal problem Abnormal CT findings /Colonic neoplasm - Presented with decreased oral intake, vomiting, black stool, weight loss.  CT abdomen/pelvis showed circumferential wall thickening in the ascending  colon with distention of cecum and ascending colon proximal to this lesion. Colonoscopy done on 5/20 showed partially obstructing ascending colon mass concerning for primary colon cancer.  Biopsies showed superficial fragments of tubular adenoma with extensive high-grade dysplasia / intramucosal carcinoma suspicious for invasion.  Chest CT has not shown any signs of metastatic disease.  CEA normal.   General surgery did laparoscopic assisted ileocolectomy on 5/21 -Started having dark brown bloody bowel movement since 5/25.  Suspect some bleeding from anastomosis.  Remaining stable.  Tolerating regular diet   Active problems Normocytic anemia -monitor since she is having some bleeding.  9.9 today   Hypothyroidism - Continue Synthyroid   Hyperlipidemia - Continue rosuvastatin   Hypertension - Continue metoprolol   Coronary artery disease - No anginal symptoms.  Continue beta-blocker, statin   Grade 1 diastolic dysfunction - Follows with cardiology as an outpatient.  Currently euvolemic   Recurrent UTI - On trimethoprim   Disposition - Lives at ILF.  Toc consulted.  PT recommended home health   Goals of care - Elderly patient with suspicion for colonic neoplasm.  CODE STATUS DNR.  History of kidney cancer status post ablation at [facility] 4 years ago. Palliative care was following.    Scheduled Meds:  acetaminophen  1,000 mg Oral TID   cyanocobalamin  500 mcg Oral Daily   fluticasone  2 spray Each Nare Daily   gabapentin  100 mg Oral BID   levothyroxine  75 mcg Oral Q breakfast   lidocaine  1 patch Transdermal Q24H   loratadine  10 mg Oral Daily   metoprolol succinate  12.5 mg Oral Daily   rosuvastatin  10 mg Oral QHS   tapentadol  50 mg Oral  BID   trimethoprim  100 mg Oral Daily   Continuous Infusions:  sodium chloride 10 mL/hr at 10/16/22 0736   potassium chloride     PRN Meds:.busPIRone, HYDROmorphone (DILAUDID) injection, methocarbamol, ondansetron **OR** ondansetron (ZOFRAN) IV,  phenol, prochlorperazine  Current Outpatient Medications  Medication Instructions   alendronate (FOSAMAX) 70 mg, Oral, Every Sun   Biotin 5 mg, Oral, Daily   calcium carbonate (OS-CAL) 1250 (500 Ca) MG chewable tablet 2 tablets, Daily   clobetasol (OLUX) 0.05 % topical foam Topical, 2 times daily   collagenase (SANTYL) 250 UNIT/GM ointment Topical, Daily, Apply to sacral Unstageable Pressure injury in a 1/8 inch layer once daily and PRN soiling of dressing indivating replacement   cyanocobalamin (VITAMIN B12) 500 mcg, Oral, Daily   ferrous sulfate 325 mg, Oral, Daily with breakfast   Flurandrenolide (CORDRAN) 4 MCG/SQCM TAPE 1 each, Apply externally, Daily PRN   fluticasone (FLONASE) 50 MCG/ACT nasal spray 2 sprays, Each Nare, BH-each morning   folic acid (FOLVITE) 1 mg, Oral, Daily   gabapentin (NEURONTIN) 100 mg, Oral, 2 times daily   levothyroxine (SYNTHROID) 75 mcg, Oral, Daily with breakfast   loratadine (CLARITIN) 10 mg, Oral, Daily   metoprolol succinate (TOPROL-XL) 12.5 mg, Oral, Daily   Nucynta 50 mg, Oral, 2 times daily   rosuvastatin (CRESTOR) 10 mg, Oral, Daily at bedtime   Sodium Sulfate-Mag Sulfate-KCl (SUTAB) [id number] MG TABS Use as directed for colonoscopy. MANUFACTURER CODES!! BIN: [id number] PCN: CN GROUP: [id number] MEMBER ID: [id number];VHQ AS SECONDARY INSURANCE ;NO PRIOR AUTHORIZATION   trimethoprim (TRIMPEX) 100 mg, Oral, Daily   Vitamin D3 2,000 Units, Oral, Daily    Diet Orders (From admission, onward)     Start     Ordered   10/14/22 0859  Diet regular Room service appropriate? Yes; Fluid consistency: Thin  Diet effective now       Question Answer Comment  Room service appropriate? Yes   Fluid consistency: Thin      10/14/22 0901            DVT prophylaxis: SCDs Start: 10/07/22 1553   Lab Results  Component Value Date   PLT 318 10/16/2022      Code Status: DNR  Family Communication: no family at bedside   Status is: Inpatient Remains  inpatient appropriate because: severity of illness   Level of care: Med-Surg  Consultants:  General surgery   Objective: Vitals:   10/14/22 2115 10/15/22 0648 10/15/22 1525 10/15/22 2010  BP: (!) 156/83 (!) 162/84 (!) 164/76 (!) 172/84  Pulse: 77 81 76 75  Resp: 16 18 18 18   Temp: 98.2 F (36.8 C) 99.7 F (37.6 C) 98.4 F (36.9 C) 98.4 F (36.9 C)  TempSrc: Oral Oral Oral Oral  SpO2: 97% 96% 99% 98%  Weight:      Height:       No intake or output data in the 24 hours ending 10/16/22 1036  Wt Readings from Last 3 Encounters:  10/10/22 54 kg  07/17/22 57.2 kg  02/07/22 52.6 kg    Examination:  Constitutional: NAD Eyes: lids and conjunctivae normal, no scleral icterus ENMT: mmm Neck: normal, supple Respiratory: clear to auscultation bilaterally, no wheezing, no crackles. Normal respiratory effort.  Cardiovascular: Regular rate and rhythm, no murmurs / rubs / gallops. No LE edema. Abdomen: soft, no distention, no tenderness. Bowel sounds positive.   Data Reviewed: I have independently reviewed following labs and imaging studies   CBC Recent Labs  Lab 10/12/22 0621 10/14/22  1610 10/14/22 1547 10/15/22 0909 10/16/22 0512  WBC 10.9* 7.4 5.4 6.1 6.2  HGB 11.2* 9.3* 9.3* 9.5* 9.9*  HCT 35.2* 29.4* 28.7* 30.0* 30.0*  PLT 286 299 286 328 318  MCV 87.6 89.6 90.8 87.7 90.6  MCH 27.9 28.4 29.4 27.8 29.9  MCHC 31.8 31.6 32.4 31.7 33.0  RDW 20.4* 20.5* 21.2* 19.9* 21.3*     Recent Labs  Lab 10/11/22 0612 10/12/22 0621 10/14/22 0707 10/15/22 0909 10/16/22 0512  NA 135 136 135 135 134*  K 4.1 3.7 3.5 3.1* 2.9*  CL 102 102 105 104 103  CO2 25 25 25 22 23   GLUCOSE 123* 130* 119* 122* 111*  BUN 10 11 20 12  7*  CREATININE 0.65 0.69 0.55 0.64 0.47  CALCIUM 8.4* 8.6* 7.9* 8.1* 8.0*  AST  --   --   --  12*  --   ALT  --   --   --  10  --   ALKPHOS  --   --   --  47  --   BILITOT  --   --   --  0.5  --   ALBUMIN  --   --   --  2.9*  --   MG  --   --   --  1.8  1.5*     ------------------------------------------------------------------------------------------------------------------ No results for input(s): "CHOL", "HDL", "LDLCALC", "TRIG", "CHOLHDL", "LDLDIRECT" in the last 72 hours.  Lab Results  Component Value Date   HGBA1C 5.3 10/09/2022   ------------------------------------------------------------------------------------------------------------------ No results for input(s): "TSH", "T4TOTAL", "T3FREE", "THYROIDAB" in the last 72 hours.  Invalid input(s): "FREET3"  Cardiac Enzymes No results for input(s): "CKMB", "TROPONINI", "MYOGLOBIN" in the last 168 hours.  Invalid input(s): "CK" ------------------------------------------------------------------------------------------------------------------ No results found for: "BNP"  CBG: No results for input(s): "GLUCAP" in the last 168 hours.  Recent Results (from the past 240 hour(s))  Surgical pcr screen     Status: None   Collection Time: 10/10/22 11:25 AM   Specimen: Nasal Mucosa; Nasal Swab  Result Value Ref Range Status   MRSA, PCR NEGATIVE NEGATIVE Final   Staphylococcus aureus NEGATIVE NEGATIVE Final    Comment: (NOTE) The Xpert SA Assay (FDA approved for NASAL specimens in patients 32 years of age and older), is one component of a comprehensive surveillance program. It is not intended to diagnose infection nor to guide or monitor treatment. Performed at [facility], [address] [place], [place] [address]      Radiology Studies: No results found.   Pamella Pert, MD, PhD [facility]  Between 7 am - 7 pm I am available, please contact me via Amion (for emergencies) or Securechat (non urgent messages)  Between 7 pm - 7 am I am not available, please contact night coverage MD/APP via Amion

## 2022-10-16 NOTE — Progress Notes (Signed)
Patient states she is unable to drink the oral potassium. Provider notified and changed to IV potassium.

## 2022-10-17 DIAGNOSIS — K639 Disease of intestine, unspecified: Secondary | ICD-10-CM | POA: Diagnosis not present

## 2022-10-17 LAB — CBC
HCT: 26.6 % — ABNORMAL LOW (ref 36.0–46.0)
Hemoglobin: 9.5 g/dL — ABNORMAL LOW (ref 12.0–15.0)
MCH: 35.4 pg — ABNORMAL HIGH (ref 26.0–34.0)
MCHC: 35.7 g/dL (ref 30.0–36.0)
MCV: 99.3 fL (ref 80.0–100.0)
Platelets: 332 10*3/uL (ref 150–400)
RBC: 2.68 MIL/uL — ABNORMAL LOW (ref 3.87–5.11)
WBC: 5.8 10*3/uL (ref 4.0–10.5)
nRBC: 0 % (ref 0.0–0.2)

## 2022-10-17 LAB — BASIC METABOLIC PANEL
Anion gap: 6 (ref 5–15)
BUN: 10 mg/dL (ref 8–23)
CO2: 26 mmol/L (ref 22–32)
Calcium: 8.1 mg/dL — ABNORMAL LOW (ref 8.9–10.3)
Chloride: 104 mmol/L (ref 98–111)
Creatinine, Ser: 0.54 mg/dL (ref 0.44–1.00)
GFR, Estimated: >60 mL/min (ref >60)
Glucose, Bld: 106 mg/dL — ABNORMAL HIGH (ref 70–99)
Potassium: 3.5 mmol/L (ref 3.5–5.1)
Sodium: 136 mmol/L (ref 135–145)

## 2022-10-17 LAB — MAGNESIUM: Magnesium: 2.1 mg/dL (ref 1.7–2.4)

## 2022-10-17 MED ORDER — ENOXAPARIN SODIUM 40 MG/0.4ML IJ SOSY
40.0000 mg | PREFILLED_SYRINGE | INTRAMUSCULAR | Status: DC
  Administered 2022-10-17 – 2022-10-23 (×7): 40 mg via SUBCUTANEOUS
  Filled 2022-10-17 (×7): qty 0.4

## 2022-10-17 NOTE — Progress Notes (Signed)
PROGRESS NOTE  Kerri [name] XBM:[id number] DOB: 24-May-1929 DOA: 10/07/2022 PCP: Delma Officer, PA   LOS: 9 days   Brief Narrative / Interim history: 89 year old female with history of colonic polyps, asthma, basal cell carcinoma of the left forehead, coronary artery disease, depression/anxiety, diverticulosis, hyperlipidemia, fatty liver disease, kidney cancer s/p ablation done in [facility] ,hypertension, hypothyroidism who was brought from [facility] for further evaluation of nausea, decreased oral intake, vomiting,wight loss, black stool. On presentation, lab work showed hemoglobin of 12. CT abdomen/pelvis showed circumferential wall thickening of the ascending colon with distention of the cecum, few enlarged mesenteric lymph nodes. GI consulted for further evaluation. Colonoscopy done on 5/20 showed partially obstructing ascending colon mass concerning for primary colon cancer. Chest CT has not shown any signs of metastatic disease. General surgery did laparoscopic assisted ileocolectomy on 5/21, pathology pending   Subjective / 24h Interval events: No significant overnight events.  Doing well this morning.  No further diarrhea/bleeding reported  Assesement and Plan: Principal Problem:   Lesion of colon Active Problems:   Hypothyroidism   Hyperlipidemia   Essential hypertension   Coronary atherosclerosis   History of recurrent UTIs   Grade I diastolic dysfunction   Abnormal CT of the abdomen   Generalized abdominal pain   Loss of weight   Nausea and vomiting   Colonic mass   Benign neoplasm of colon   Palliative care encounter   Goals of care, counseling/discussion   Need for emotional support   Counseling and coordination of care   Principal problem Abnormal CT findings /Colonic neoplasm - Presented with decreased oral intake, vomiting, black stool, weight loss.  CT abdomen/pelvis showed circumferential wall thickening in the ascending colon with distention of cecum and ascending  colon proximal to this lesion. Colonoscopy done on 5/20 showed partially obstructing ascending colon mass concerning for primary colon cancer.  Biopsies showed superficial fragments of tubular adenoma with extensive high-grade dysplasia / intramucosal carcinoma suspicious for invasion.  Chest CT has not shown any signs of metastatic disease.  CEA normal.   General surgery did laparoscopic assisted ileocolectomy on 5/21 -Started having dark brown bloody bowel movement since 5/25.  Suspect some bleeding from anastomosis.  Remaining stable.  Tolerating regular diet -Pending pathology, disposition to be determined whether hospice should be arranged.  See palliative care note dated 5/27.   Active problems Normocytic anemia -hemoglobin stable   Hypothyroidism - Continue Synthyroid   Hyperlipidemia - Continue rosuvastatin  Hypokalemia, hypomagnesemia -continue to replete and monitor  Hypertension - Continue metoprolol   Coronary artery disease - No anginal symptoms.  Continue beta-blocker, statin   Grade 1 diastolic dysfunction - Follows with cardiology as an outpatient.  Euvolemic this morning   Recurrent UTI - On trimethoprim   Disposition - Lives at ILF.  Toc consulted.  PT recommended home health   Goals of care - Elderly patient with suspicion for colonic neoplasm.  CODE STATUS DNR.  History of kidney cancer status post ablation at [facility] 4 years ago. Palliative care was following.    Scheduled Meds:  acetaminophen  1,000 mg Oral TID   cyanocobalamin  500 mcg Oral Daily   fluticasone  2 spray Each Nare Daily   gabapentin  100 mg Oral BID   levothyroxine  75 mcg Oral Q breakfast   lidocaine  1 patch Transdermal Q24H   loratadine  10 mg Oral Daily   metoprolol succinate  12.5 mg Oral Daily   rosuvastatin  10 mg Oral QHS  tapentadol  50 mg Oral BID   trimethoprim  100 mg Oral Daily   Continuous Infusions:  sodium chloride 10 mL/hr at 10/17/22 0617   PRN Meds:.busPIRone,  HYDROmorphone (DILAUDID) injection, methocarbamol, ondansetron **OR** ondansetron (ZOFRAN) IV, phenol, prochlorperazine  Current Outpatient Medications  Medication Instructions   alendronate (FOSAMAX) 70 mg, Oral, Every Sun   Biotin 5 mg, Oral, Daily   calcium carbonate (OS-CAL) 1250 (500 Ca) MG chewable tablet 2 tablets, Daily   clobetasol (OLUX) 0.05 % topical foam Topical, 2 times daily   collagenase (SANTYL) 250 UNIT/GM ointment Topical, Daily, Apply to sacral Unstageable Pressure injury in a 1/8 inch layer once daily and PRN soiling of dressing indivating replacement   cyanocobalamin (VITAMIN B12) 500 mcg, Oral, Daily   ferrous sulfate 325 mg, Oral, Daily with breakfast   Flurandrenolide (CORDRAN) 4 MCG/SQCM TAPE 1 each, Apply externally, Daily PRN   fluticasone (FLONASE) 50 MCG/ACT nasal spray 2 sprays, Each Nare, BH-each morning   folic acid (FOLVITE) 1 mg, Oral, Daily   gabapentin (NEURONTIN) 100 mg, Oral, 2 times daily   levothyroxine (SYNTHROID) 75 mcg, Oral, Daily with breakfast   loratadine (CLARITIN) 10 mg, Oral, Daily   metoprolol succinate (TOPROL-XL) 12.5 mg, Oral, Daily   Nucynta 50 mg, Oral, 2 times daily   rosuvastatin (CRESTOR) 10 mg, Oral, Daily at bedtime   Sodium Sulfate-Mag Sulfate-KCl (SUTAB) [id number] MG TABS Use as directed for colonoscopy. MANUFACTURER CODES!! BIN: [id number] PCN: CN GROUP: [id number] MEMBER ID: [id number];VHQ AS SECONDARY INSURANCE ;NO PRIOR AUTHORIZATION   trimethoprim (TRIMPEX) 100 mg, Oral, Daily   Vitamin D3 2,000 Units, Oral, Daily    Diet Orders (From admission, onward)     Start     Ordered   10/14/22 0859  Diet regular Room service appropriate? Yes; Fluid consistency: Thin  Diet effective now       Question Answer Comment  Room service appropriate? Yes   Fluid consistency: Thin      10/14/22 0901            DVT prophylaxis: SCDs Start: 10/07/22 1553   Lab Results  Component Value Date   PLT 318 10/16/2022      Code  Status: DNR  Family Communication: no family at bedside   Status is: Inpatient Remains inpatient appropriate because: severity of illness   Level of care: Med-Surg  Consultants:  General surgery   Objective: Vitals:   10/15/22 2010 10/16/22 1318 10/16/22 2115 10/17/22 0544  BP: (!) 172/84 (!) 147/74 (!) 132/91 125/86  Pulse: 75 78 73 67  Resp: 18 18 18 16   Temp: 98.4 F (36.9 C) 98 F (36.7 C) 97.8 F (36.6 C) 98.6 F (37 C)  TempSrc: Oral Oral Oral Oral  SpO2: 98% 99% 97% 98%  Weight:      Height:        Intake/Output Summary (Last 24 hours) at 10/17/2022 0719 Last data filed at 10/17/2022 0617 Gross per 24 hour  Intake 2500.6 ml  Output --  Net 2500.6 ml    Wt Readings from Last 3 Encounters:  10/10/22 54 kg  07/17/22 57.2 kg  02/07/22 52.6 kg    Examination:  Constitutional: NAD Eyes: lids and conjunctivae normal, no scleral icterus ENMT: mmm Neck: normal, supple Respiratory: clear to auscultation bilaterally, no wheezing, no crackles. Normal respiratory effort.  Cardiovascular: Regular rate and rhythm, no murmurs / rubs / gallops. No LE edema. Abdomen: soft, no distention, no tenderness. Bowel sounds positive.   Data Reviewed: I  have independently reviewed following labs and imaging studies   CBC Recent Labs  Lab 10/12/22 0621 10/14/22 0707 10/14/22 1547 10/15/22 0909 10/16/22 0512  WBC 10.9* 7.4 5.4 6.1 6.2  HGB 11.2* 9.3* 9.3* 9.5* 9.9*  HCT 35.2* 29.4* 28.7* 30.0* 30.0*  PLT 286 299 286 328 318  MCV 87.6 89.6 90.8 87.7 90.6  MCH 27.9 28.4 29.4 27.8 29.9  MCHC 31.8 31.6 32.4 31.7 33.0  RDW 20.4* 20.5* 21.2* 19.9* 21.3*     Recent Labs  Lab 10/11/22 0612 10/12/22 0621 10/14/22 0707 10/15/22 0909 10/16/22 0512  NA 135 136 135 135 134*  K 4.1 3.7 3.5 3.1* 2.9*  CL 102 102 105 104 103  CO2 25 25 25 22 23   GLUCOSE 123* 130* 119* 122* 111*  BUN 10 11 20 12  7*  CREATININE 0.65 0.69 0.55 0.64 0.47  CALCIUM 8.4* 8.6* 7.9* 8.1* 8.0*   AST  --   --   --  12*  --   ALT  --   --   --  10  --   ALKPHOS  --   --   --  47  --   BILITOT  --   --   --  0.5  --   ALBUMIN  --   --   --  2.9*  --   MG  --   --   --  1.8 1.5*     ------------------------------------------------------------------------------------------------------------------ No results for input(s): "CHOL", "HDL", "LDLCALC", "TRIG", "CHOLHDL", "LDLDIRECT" in the last 72 hours.  Lab Results  Component Value Date   HGBA1C 5.3 10/09/2022   ------------------------------------------------------------------------------------------------------------------ No results for input(s): "TSH", "T4TOTAL", "T3FREE", "THYROIDAB" in the last 72 hours.  Invalid input(s): "FREET3"  Cardiac Enzymes No results for input(s): "CKMB", "TROPONINI", "MYOGLOBIN" in the last 168 hours.  Invalid input(s): "CK" ------------------------------------------------------------------------------------------------------------------ No results found for: "BNP"  CBG: No results for input(s): "GLUCAP" in the last 168 hours.  Recent Results (from the past 240 hour(s))  Surgical pcr screen     Status: None   Collection Time: 10/10/22 11:25 AM   Specimen: Nasal Mucosa; Nasal Swab  Result Value Ref Range Status   MRSA, PCR NEGATIVE NEGATIVE Final   Staphylococcus aureus NEGATIVE NEGATIVE Final    Comment: (NOTE) The Xpert SA Assay (FDA approved for NASAL specimens in patients 36 years of age and older), is one component of a comprehensive surveillance program. It is not intended to diagnose infection nor to guide or monitor treatment. Performed at [facility], [address] [place], [place] [address]      Radiology Studies: No results found.   Pamella Pert, MD, PhD [facility]  Between 7 am - 7 pm I am available, please contact me via Amion (for emergencies) or Securechat (non urgent messages)  Between 7 pm - 7 am I am not available, please  contact night coverage MD/APP via Amion

## 2022-10-17 NOTE — Progress Notes (Signed)
7 Days Post-Op   Subjective/Chief Complaint: Pt ok    Objective: Vital signs in last 24 hours: Temp:  [97.8 F (36.6 C)-98.6 F (37 C)] 98.6 F (37 C) (05/28 0544) Pulse Rate:  [67-86] 86 (05/28 0949) Resp:  [16-18] 16 (05/28 0544) BP: (112-147)/(64-91) 112/64 (05/28 0949) SpO2:  [97 %-99 %] 98 % (05/28 0544) Last BM Date : 10/16/22  Intake/Output from previous day: 05/27 0701 - 05/28 0700 In: 2500.6 [I.V.:2130.8; IV Piggyback:369.8] Out: -  Intake/Output this shift: No intake/output data recorded.   Exam: Awake and alert Abdomen soft, dressing dry Lab Results:  Recent Labs    10/16/22 0512 10/17/22 0600  WBC 6.2 5.8  HGB 9.9* 9.5*  HCT 30.0* 26.6*  PLT 318 332   BMET Recent Labs    10/16/22 0512 10/17/22 0600  NA 134* 136  K 2.9* 3.5  CL 103 104  CO2 23 26  GLUCOSE 111* 106*  BUN 7* 10  CREATININE 0.47 0.54  CALCIUM 8.0* 8.1*   PT/INR No results for input(s): "LABPROT", "INR" in the last 72 hours. ABG No results for input(s): "PHART", "HCO3" in the last 72 hours.  Invalid input(s): "PCO2", "PO2"  Studies/Results: No results found.  Anti-infectives: Anti-infectives (From admission, onward)    Start     Dose/Rate Route Frequency Ordered Stop   10/10/22 0600  cefoTEtan (CEFOTAN) 2 g in sodium chloride 0.9 % 100 mL IVPB        2 g 200 mL/hr over 30 Minutes Intravenous On call to O.R. 10/09/22 1623 10/11/22 0700   10/07/22 1615  trimethoprim (TRIMPEX) tablet 100 mg        100 mg Oral Daily 10/07/22 1605         Assessment/Plan: s/p Procedure(s): COLON RESECTION LAPAROSCOPIC (N/A) S/p lap assisted partial colectomy for colon cancer   POD#6   Needs to continue to work with therapy for deconditioning Encourage po intake and being out of bed Awaiting pathology Hgb stable  LOS: 9 days    Dortha Schwalbe MD  10/17/2022

## 2022-10-17 NOTE — Progress Notes (Signed)
Physical Therapy Treatment Patient Details Name: Kerri [name] MRN: [id number] DOB: 1929/12/29 Today's Date: 10/17/2022   History of Present Illness Pt is 89 yo female on 10/07/22 with colonic neoplasm s/p laparoscopic assisted ileocolectomy on 10/10/22.  Pt with hx including colonic polyps, asthma, basal cell carcinoma of the left forehead, coronary artery disease, depression/anxiety, diverticulosis, hyperlipidemia, fatty liver disease, kidney cancer s/p ablation done in [facility] ,hypertension, hypothyroidism    PT Comments    Pt resides at [facility] and loved walking her dog everyday. Currently pt requires assist for all mobility.  General bed mobility comments: Required Min Assist for upper body due to ABD pain/surgery and increased effort/time to complete scooting to EOB.  General transfer comment: Required assist to rise even from an elevated bed present with initial posterior lean.  Unsteady.  Nervous.General Gait Details: VERY limited amb distance 16 feet due to effort/fatigue.  VERY short shuffled steps.  Recliner following for safety.  Max c/o weakness.  Limited tolerance.   Pt will need ST Rehab at SNF to address mobility and functional decline prior to safely returning home.   Recommendations for follow up therapy are one component of a multi-disciplinary discharge planning process, led by the attending physician.  Recommendations may be updated based on patient status, additional functional criteria and insurance authorization.  Follow Up Recommendations  Can patient physically be transported by private vehicle: Yes    Assistance Recommended at Discharge Frequent or constant Supervision/Assistance  Patient can return home with the following A little help with walking and/or transfers;A little help with bathing/dressing/bathroom   Equipment Recommendations  None recommended by PT    Recommendations for Other Services       Precautions / Restrictions Precautions Precautions:  Fall Precaution Comments: incont Restrictions Weight Bearing Restrictions: No     Mobility  Bed Mobility Overal bed mobility: Needs Assistance Bed Mobility: Supine to Sit     Supine to sit: Min assist     General bed mobility comments: Required Min Assist for upper body due to ABD pain/surgery and increased effort/time to complete scooting to EOB    Transfers Overall transfer level: Needs assistance Equipment used: Rolling walker (2 wheels) Transfers: Sit to/from Stand Sit to Stand: Min assist, Mod assist           General transfer comment: Required assist to rise even from an elevated bed present with initial posterior lean.  Unsteady.  Nervous.    Ambulation/Gait Ambulation/Gait assistance: Min assist, Mod assist Gait Distance (Feet): 16 Feet Assistive device: Rolling walker (2 wheels) Gait Pattern/deviations: Step-through pattern, Decreased stride length Gait velocity: decreased     General Gait Details: VERY limited amb distance 16 feet due to effort/fatigue.  VERY short shuffled steps.  Recliner following for safety.  Max c/o weakness.  Limited tolerance.   Stairs             Wheelchair Mobility    Modified Rankin (Stroke Patients Only)       Balance                                            Cognition Arousal/Alertness: Awake/alert Behavior During Therapy: WFL for tasks assessed/performed Overall Cognitive Status: Within Functional Limits for tasks assessed  General Comments: AxO x 3 pleasant [facility]        Exercises      General Comments        Pertinent Vitals/Pain Pain Assessment Pain Assessment: Faces Faces Pain Scale: Hurts a little bit Pain Location: abdomen Pain Descriptors / Indicators: Guarding, Discomfort, Operative site guarding Pain Intervention(s): Monitored during session, Repositioned    Home Living                          Prior Function             PT Goals (current goals can now be found in the care plan section) Progress towards PT goals: Progressing toward goals    Frequency    Min 1X/week      PT Plan Current plan remains appropriate    Co-evaluation              AM-PAC PT "6 Clicks" Mobility   Outcome Measure  Help needed turning from your back to your side while in a flat bed without using bedrails?: A Lot Help needed moving from lying on your back to sitting on the side of a flat bed without using bedrails?: A Lot Help needed moving to and from a bed to a chair (including a wheelchair)?: A Lot Help needed standing up from a chair using your arms (e.g., wheelchair or bedside chair)?: A Lot Help needed to walk in hospital room?: A Lot Help needed climbing 3-5 steps with a railing? : Total 6 Click Score: 11    End of Session Equipment Utilized During Treatment: Gait belt Activity Tolerance: Patient tolerated treatment well Patient left: in chair;with chair alarm set;with call bell/phone within reach Nurse Communication: Mobility status PT Visit Diagnosis: Other abnormalities of gait and mobility (R26.89);Muscle weakness (generalized) (M62.81)     Time: 1610-9604 PT Time Calculation (min) (ACUTE ONLY): 28 min  Charges:  $Gait Training: 8-22 mins $Therapeutic Activity: 8-22 mins                     Felecia Shelling  PTA Acute  Rehabilitation Services Office M-F          [phone]

## 2022-10-18 DIAGNOSIS — K6389 Other specified diseases of intestine: Secondary | ICD-10-CM

## 2022-10-18 LAB — BASIC METABOLIC PANEL
Anion gap: 7 (ref 5–15)
BUN: 13 mg/dL (ref 8–23)
CO2: 26 mmol/L (ref 22–32)
Calcium: 8.1 mg/dL — ABNORMAL LOW (ref 8.9–10.3)
Chloride: 104 mmol/L (ref 98–111)
Creatinine, Ser: 0.64 mg/dL (ref 0.44–1.00)
GFR, Estimated: >60 mL/min (ref >60)
Glucose, Bld: 103 mg/dL — ABNORMAL HIGH (ref 70–99)
Potassium: 3.6 mmol/L (ref 3.5–5.1)
Sodium: 137 mmol/L (ref 135–145)

## 2022-10-18 LAB — CBC
HCT: 29.8 % — ABNORMAL LOW (ref 36.0–46.0)
Hemoglobin: 9.3 g/dL — ABNORMAL LOW (ref 12.0–15.0)
MCH: 28.5 pg (ref 26.0–34.0)
MCHC: 31.2 g/dL (ref 30.0–36.0)
MCV: 91.4 fL (ref 80.0–100.0)
Platelets: 345 10*3/uL (ref 150–400)
RBC: 3.26 MIL/uL — ABNORMAL LOW (ref 3.87–5.11)
RDW: 21.2 % — ABNORMAL HIGH (ref 11.5–15.5)
WBC: 5.6 10*3/uL (ref 4.0–10.5)
nRBC: 0 % (ref 0.0–0.2)

## 2022-10-18 LAB — MAGNESIUM: Magnesium: 1.8 mg/dL (ref 1.7–2.4)

## 2022-10-18 NOTE — Progress Notes (Signed)
Patient refused to take medications from nurse stating that she wanted me to leave medications that she would take them, made patient aware that I could not leave medications as she had narcotic medication (Nucynta) in medication cup, Pt stated that the other nurses have left the medications and I stated that I was not the other nurses that I could not leave the medication and she said that I could fire the other nurses. She then said she needed something to eat I gave her her personal graham crackers and sat in the window seat in the room, she said I'm not going to take the medication, I'm not going to be forced. I stated, I'm not forcing you. She then said you can take the medication. I took the medication and left the room. Charge nurse aware.

## 2022-10-18 NOTE — Progress Notes (Signed)
Mobility Specialist - Progress Note   10/18/22 1616  Mobility  Activity Ambulated with assistance to bathroom  Level of Assistance Standby assist, set-up cues, supervision of patient - no hands on  Assistive Device Front wheel walker  Distance Ambulated (ft) 20 ft  Activity Response Tolerated well  Mobility Referral Yes  $Mobility charge 1 Mobility   Pt received in bed requesting assistance to the bathroom. No complaints during session. Pt to bed after session with all needs met.   [facility]

## 2022-10-18 NOTE — Progress Notes (Signed)
PROGRESS NOTE    Kerri [name]  QIO:[id number] DOB: 09/15/1929 DOA: 10/07/2022 PCP: Delma Officer, PA   Brief Narrative:  89 year old female with history of colonic polyps, asthma, basal cell carcinoma of the left forehead, coronary artery disease, depression/anxiety, diverticulosis, hyperlipidemia, fatty liver disease, kidney cancer s/p ablation done in [facility] ,hypertension, hypothyroidism presented with nausea, decreased oral intake, vomiting,wight loss, black stool. CT abdomen/pelvis showed circumferential wall thickening of the ascending colon with distention of the cecum, few enlarged mesenteric lymph nodes. Colonoscopy done on 5/20 showed partially obstructing ascending colon mass concerning for primary colon cancer. Chest CT did not show any signs of metastatic disease. General surgery did laparoscopic assisted ileocolectomy on 5/21.  Pathology showed invasive adenocarcinoma.  Patient does not want any chemotherapy or radiation.  PT is recommending SNF placement.  Assessment & Plan:   New diagnosis of invasive adenocarcinoma of colon, T3N0 Goals of care -CT abdomen/pelvis showed circumferential wall thickening of the ascending colon with distention of the cecum, few enlarged mesenteric lymph nodes. Colonoscopy done on 5/20 showed partially obstructing ascending colon mass concerning for primary colon cancer. Chest CT did not show any signs of metastatic disease. General surgery did laparoscopic assisted ileocolectomy on 5/21.  Pathology showed invasive adenocarcinoma.  Patient does not want any chemotherapy or radiation. -General surgery following.  Currently tolerating diet. -Palliative care following: Outpatient follow-up with palliative care.  Anemia of chronic disease -Most likely from above.  Hemoglobin stable.  Monitor intermittently  Hypothyroidism - Continue Synthyroid   Hyperlipidemia - Continue rosuvastatin   Hypokalemia, hypomagnesemia -Improved   Hypertension - Continue  metoprolol   Coronary artery disease - No anginal symptoms.  Continue beta-blocker, statin   Grade 1 diastolic dysfunction - Follows with cardiology as an outpatient. Currently  euvolemic   Recurrent UTI - On trimethoprim   Disposition - Lives at ILF.  PT recommended SNF. TOC following.   DVT prophylaxis: Lovenox Code Status: DNR Family Communication: Daughter on phone Disposition Plan: Status is: Inpatient Remains inpatient appropriate because: Of severe dizziness  Consultants: General surgery/palliative care Procedures: As above  Antimicrobials:  Anti-infectives (From admission, onward)    Start     Dose/Rate Route Frequency Ordered Stop   10/10/22 0600  cefoTEtan (CEFOTAN) 2 g in sodium chloride 0.9 % 100 mL IVPB        2 g 200 mL/hr over 30 Minutes Intravenous On call to O.R. 10/09/22 1623 10/11/22 0700   10/07/22 1615  trimethoprim (TRIMPEX) tablet 100 mg        100 mg Oral Daily 10/07/22 1605          Subjective: Patient seen and examined at bedside.  No fever, vomiting, worsening abdominal pain reported.  Objective: Vitals:   10/17/22 1500 10/17/22 2204 10/18/22 0614 10/18/22 1308  BP: 120/82 112/70 126/74 134/83  Pulse: 74 85 74 86  Resp: 17 18 18    Temp: 98.2 F (36.8 C) 97.7 F (36.5 C) 97.8 F (36.6 C) 97.8 F (36.6 C)  TempSrc: Oral Oral Oral   SpO2:  95% 98% 95%  Weight:      Height:        Intake/Output Summary (Last 24 hours) at 10/18/2022 1349 Last data filed at 10/18/2022 1117 Gross per 24 hour  Intake 471 ml  Output 350 ml  Net 121 ml   Filed Weights   10/07/22 1710 10/09/22 1320 10/10/22 1041  Weight: 54.4 kg 54.4 kg 54 kg    Examination:  General exam: Appears calm  and comfortable.  Elderly female lying in bed.  On room air. Respiratory system: Bilateral decreased breath sounds at bases with some scattered crackles Cardiovascular system: S1 & S2 heard, Rate controlled Gastrointestinal system: Abdomen is nondistended, soft and  mildly tender.  Lower abdominal dressing present.  Normal bowel sounds heard. Extremities: No cyanosis, clubbing, edema  Central nervous system: Alert and oriented.  Slow to respond.  No focal neurological deficits. Moving extremities Skin: No rashes, lesions or ulcers Psychiatry: Affect is.  Not agitated.  Data Reviewed: I have personally reviewed following labs and imaging studies  CBC: Recent Labs  Lab 10/14/22 1547 10/15/22 0909 10/16/22 0512 10/17/22 0600 10/18/22 0642  WBC 5.4 6.1 6.2 5.8 5.6  HGB 9.3* 9.5* 9.9* 9.5* 9.3*  HCT 28.7* 30.0* 30.0* 26.6* 29.8*  MCV 90.8 87.7 90.6 99.3 91.4  PLT 286 328 318 332 345   Basic Metabolic Panel: Recent Labs  Lab 10/14/22 0707 10/15/22 0909 10/16/22 0512 10/17/22 0600 10/18/22 0642  NA 135 135 134* 136 137  K 3.5 3.1* 2.9* 3.5 3.6  CL 105 104 103 104 104  CO2 25 22 23 26 26   GLUCOSE 119* 122* 111* 106* 103*  BUN 20 12 7* 10 13  CREATININE 0.55 0.64 0.47 0.54 0.64  CALCIUM 7.9* 8.1* 8.0* 8.1* 8.1*  MG  --  1.8 1.5* 2.1 1.8   GFR: Estimated Creatinine Clearance: 39.8 mL/min (by C-G formula based on SCr of 0.64 mg/dL). Liver Function Tests: Recent Labs  Lab 10/15/22 0909  AST 12*  ALT 10  ALKPHOS 47  BILITOT 0.5  PROT 6.0*  ALBUMIN 2.9*   No results for input(s): "LIPASE", "AMYLASE" in the last 168 hours. No results for input(s): "AMMONIA" in the last 168 hours. Coagulation Profile: No results for input(s): "INR", "PROTIME" in the last 168 hours. Cardiac Enzymes: No results for input(s): "CKTOTAL", "CKMB", "CKMBINDEX", "TROPONINI" in the last 168 hours. BNP (last 3 results) No results for input(s): "PROBNP" in the last 8760 hours. HbA1C: No results for input(s): "HGBA1C" in the last 72 hours. CBG: No results for input(s): "GLUCAP" in the last 168 hours. Lipid Profile: No results for input(s): "CHOL", "HDL", "LDLCALC", "TRIG", "CHOLHDL", "LDLDIRECT" in the last 72 hours. Thyroid Function Tests: No results for  input(s): "TSH", "T4TOTAL", "FREET4", "T3FREE", "THYROIDAB" in the last 72 hours. Anemia Panel: No results for input(s): "VITAMINB12", "FOLATE", "FERRITIN", "TIBC", "IRON", "RETICCTPCT" in the last 72 hours. Sepsis Labs: No results for input(s): "PROCALCITON", "LATICACIDVEN" in the last 168 hours.  Recent Results (from the past 240 hour(s))  Surgical pcr screen     Status: None   Collection Time: 10/10/22 11:25 AM   Specimen: Nasal Mucosa; Nasal Swab  Result Value Ref Range Status   MRSA, PCR NEGATIVE NEGATIVE Final   Staphylococcus aureus NEGATIVE NEGATIVE Final    Comment: (NOTE) The Xpert SA Assay (FDA approved for NASAL specimens in patients 36 years of age and older), is one component of a comprehensive surveillance program. It is not intended to diagnose infection nor to guide or monitor treatment. Performed at [facility], [address] [place], [place] [address]          Radiology Studies: No results found.      Scheduled Meds:  acetaminophen  1,000 mg Oral TID   cyanocobalamin  500 mcg Oral Daily   enoxaparin (LOVENOX) injection  40 mg Subcutaneous Q24H   fluticasone  2 spray Each Nare Daily   gabapentin  100 mg Oral  BID   levothyroxine  75 mcg Oral Q breakfast   lidocaine  1 patch Transdermal Q24H   loratadine  10 mg Oral Daily   metoprolol succinate  12.5 mg Oral Daily   rosuvastatin  10 mg Oral QHS   tapentadol  50 mg Oral BID   trimethoprim  100 mg Oral Daily   Continuous Infusions:  sodium chloride 10 mL/hr at 10/17/22 0617          Glade Lloyd, MD [facility] 10/18/2022, 1:49 PM

## 2022-10-18 NOTE — TOC Progression Note (Addendum)
Transition of Care [facility]) - Progression Note    Patient Details  Name: Kerri [name] MRN: [id number] Date of Birth: 04/22/1930  [facility] CM/SW Contact  Beckie Busing, RN Phone Number:[phone]  10/18/2022, 3:36 PM  Clinical Narrative:    PT recommendation has now changed to SNF for short term rehab. CM at bedside to offer choice. Patient states that she has already spoken with her daughter about choice and that CM can discuss with her daughter. CM called daughter Daisy Lazar who confirms that she wants her mother transported to a facility in the [place] area. CM has explained to daughter that CM will need to reach out to facilities and that family will be responsible for the cost of transportation. Daughter verbalized understanding and expresses that she is more interested in the facility choice.daughter has only 2 preferences for facilities and states that she will wait before selecting other facility options. CM will initiate SNF workup.   Expected Discharge Plan: Home/Self Care Barriers to Discharge: No SNF bed (To initiate SNF workup)  Expected Discharge Plan and Services       Living arrangements for the past 2 months: Independent Living Facility [facility])                                       Social Determinants of Health (SDOH) Interventions SDOH Screenings   Food Insecurity: No Food Insecurity (10/08/2022)  Housing: Low Risk  (10/08/2022)  Transportation Needs: No Transportation Needs (10/08/2022)  Utilities: Not At Risk (10/08/2022)  Tobacco Use: Medium Risk (10/11/2022)    Readmission Risk Interventions    10/13/2022   10:13 AM 09/11/2021    2:25 PM  Readmission Risk Prevention Plan  Transportation Screening Complete Complete  PCP or Specialist Appt within 5-7 Days Complete   PCP or Specialist Appt within 3-5 Days  Complete  Home Care Screening Complete   Medication Review (RN CM) Complete   HRI or Home Care Consult  Complete  Social  Work Consult for Recovery Care Planning/Counseling  Complete  Palliative Care Screening  Complete

## 2022-10-18 NOTE — Progress Notes (Signed)
8 Days Post-Op   Subjective/Chief Complaint: Pt ok    Objective: Vital signs in last 24 hours: Temp:  [97.7 F (36.5 C)-98.2 F (36.8 C)] 97.8 F (36.6 C) (05/29 0614) Pulse Rate:  [74-86] 74 (05/29 0614) Resp:  [17-18] 18 (05/29 0614) BP: (112-126)/(64-82) 126/74 (05/29 0614) SpO2:  [95 %-98 %] 98 % (05/29 0614) Last BM Date : 10/16/22  Intake/Output from previous day: 05/28 0701 - 05/29 0700 In: -  Out: 350 [Urine:350] Intake/Output this shift: No intake/output data recorded.  Incision abdomen  CDI   Lab Results:  Recent Labs    10/17/22 0600 10/18/22 0642  WBC 5.8 5.6  HGB 9.5* 9.3*  HCT 26.6* 29.8*  PLT 332 345   BMET Recent Labs    10/17/22 0600 10/18/22 0642  NA 136 137  K 3.5 3.6  CL 104 104  CO2 26 26  GLUCOSE 106* 103*  BUN 10 13  CREATININE 0.54 0.64  CALCIUM 8.1* 8.1*   PT/INR No results for input(s): "LABPROT", "INR" in the last 72 hours. ABG No results for input(s): "PHART", "HCO3" in the last 72 hours.  Invalid input(s): "PCO2", "PO2"  Studies/Results: No results found.  Anti-infectives: Anti-infectives (From admission, onward)    Start     Dose/Rate Route Frequency Ordered Stop   10/10/22 0600  cefoTEtan (CEFOTAN) 2 g in sodium chloride 0.9 % 100 mL IVPB        2 g 200 mL/hr over 30 Minutes Intravenous On call to O.R. 10/09/22 1623 10/11/22 0700   10/07/22 1615  trimethoprim (TRIMPEX) tablet 100 mg        100 mg Oral Daily 10/07/22 1605         Assessment/Plan: s/p Procedure(s): COLON RESECTION LAPAROSCOPIC (N/A) PATH T3N0   Stable for placement  Remove dressing tomorrow Ok to shower and ambulate   LOS: 10 days    Dortha Schwalbe MD  10/18/2022

## 2022-10-19 DIAGNOSIS — K6389 Other specified diseases of intestine: Secondary | ICD-10-CM | POA: Diagnosis not present

## 2022-10-19 LAB — URINALYSIS, ROUTINE W REFLEX MICROSCOPIC
Bilirubin Urine: NEGATIVE
Glucose, UA: NEGATIVE mg/dL
Ketones, ur: NEGATIVE mg/dL
Nitrite: POSITIVE — AB
Protein, ur: 100 mg/dL — AB
Specific Gravity, Urine: 1.023 (ref 1.005–1.030)
WBC, UA: >50 WBC/hpf (ref 0–5)
pH: 7 (ref 5.0–8.0)

## 2022-10-19 LAB — SURGICAL PATHOLOGY

## 2022-10-19 MED ORDER — PROCHLORPERAZINE MALEATE 10 MG PO TABS
10.0000 mg | ORAL_TABLET | Freq: Four times a day (QID) | ORAL | Status: DC | PRN
  Administered 2022-10-21: 10 mg via ORAL
  Filled 2022-10-19: qty 1

## 2022-10-19 MED ORDER — PROCHLORPERAZINE MALEATE 10 MG PO TABS: 10.0000 mg | ORAL_TABLET | Freq: Four times a day (QID) | ORAL | Status: DC | PRN

## 2022-10-19 NOTE — Care Management Important Message (Signed)
Important Message  Patient Details IM Letter given. Name: Kassidee Hower MRN: [id number] Date of Birth: [birth year]   Medicare Important Message Given:  Yes     Caren Macadam 10/19/2022, 9:09 AM

## 2022-10-19 NOTE — NC FL2 (Signed)
[place] MEDICAID FL2 LEVEL OF CARE FORM     IDENTIFICATION  Patient Name: Kerri [name] Birthdate: [birth year] Sex: female Admission Date (Current Location): 10/07/2022  [facility] and [place] Number:  Producer, television/film/video and Address:  [facility],  [address] [place], [place] [address]      Provider Number: [id number]  Attending Physician Name and Address:  Glade Lloyd, MD  Relative Name and Phone Number:  Daisy Lazar   [phone]    Current Level of Care: Hospital Recommended Level of Care: Skilled Nursing Facility Prior Approval Number:    Date Approved/Denied:   PASRR Number: [id number]  Discharge Plan: SNF    Current Diagnoses: Patient Active Problem List   Diagnosis Date Noted   Palliative care encounter 10/10/2022   Goals of care, counseling/discussion 10/10/2022   Need for emotional support 10/10/2022   Counseling and coordination of care 10/10/2022   Colonic mass 10/09/2022   Benign neoplasm of colon 10/09/2022   Abnormal CT of the abdomen 10/08/2022   Generalized abdominal pain 10/08/2022   Loss of weight 10/08/2022   Nausea and vomiting 10/08/2022   Lesion of colon 10/07/2022   Grade I diastolic dysfunction 10/07/2022   Pressure injury of skin 12/20/2021   Syncope and collapse 12/19/2021   Chronic pain disorder 12/19/2021   DNR (do not resuscitate) 12/19/2021   Folate deficiency 09/10/2021   Acute UTI (urinary tract infection) 09/09/2021   Ptosis of right eyelid 09/09/2021   AKI (acute kidney injury) (HCC) 08/15/2021   Acute metabolic encephalopathy 08/15/2021   Lactic acidosis 08/15/2021   Protein-calorie malnutrition, severe 08/12/2021   Aggression    Normocytic anemia 08/11/2021   NSVT (nonsustained ventricular tachycardia) (HCC) 08/10/2021   Elevated troponin 08/10/2021   UTI (urinary tract infection) 08/10/2021   Hip fracture (HCC) 08/09/2021   Elevated CK 08/09/2021   Dehydration 08/09/2021   Anxiety 06/17/2021   History of  recurrent UTIs 07/08/2020   SOB (shortness of breath) 04/07/2020   Primary osteoarthritis of right knee 10/06/2019   GERD (gastroesophageal reflux disease) 06/06/2019   Palpitations 03/29/2019   Failed total hip arthroplasty (HCC) 11/27/2018   Melanoma (HCC)    Dizziness 01/30/2017   Lumbar stenosis with neurogenic claudication 11/13/2013   Sebaceous cyst 11/20/2012   Squamous cell carcinoma in situ of skin of calf 04/03/2012     Hypokalemia 09/08/2011   Postop Acute blood loss anemia 09/05/2011   Osteoarthritis of hip 09/04/2011   Melanoma of lower leg (HCC) 05/03/2011   PVC's (premature ventricular contractions) 07/30/2009   Hypothyroidism 05/10/2009   Hyperlipidemia 05/10/2009   Essential hypertension 05/10/2009   Coronary atherosclerosis 05/10/2009   OA (osteoarthritis) of knee 05/10/2009   SPINAL STENOSIS 05/10/2009   Carotid bruit 05/10/2009    Orientation RESPIRATION BLADDER Height & Weight     Self, Time, Situation, Place  Normal Continent Weight: 54 kg Height:  5\' 5"  (165.1 cm)  BEHAVIORAL SYMPTOMS/MOOD NEUROLOGICAL BOWEL NUTRITION STATUS     (n/a) Continent Diet  AMBULATORY STATUS COMMUNICATION OF NEEDS Skin   Limited Assist Verbally Normal, Surgical wounds (closed surgical incision to abdomen)                       Personal Care Assistance Level of Assistance  Bathing, Feeding, Dressing Bathing Assistance: Limited assistance Feeding assistance: Independent Dressing Assistance: Limited assistance     Functional Limitations Info  Sight, Hearing, Speech Sight Info: Adequate Hearing Info: Adequate Speech Info: Adequate    SPECIAL  CARE FACTORS FREQUENCY  PT (By licensed PT), OT (By licensed OT)     PT Frequency: 5X/wk OT Frequency: 5X/wk            Contractures Contractures Info: Not present    Additional Factors Info  Code Status, Allergies, Psychotropic, Insulin Sliding Scale, Isolation Precautions, Suctioning Needs Code Status Info:  DNR Allergies Info: Buprenorphine Hcl, Codeine, Morphine, Tramadol, Adhesive (Tape), Doxycycline Hyclate, Hydrocodone-acetaminophen, Other, Oxycodone Hcl, Tapentadol, Lactose Intolerance (Gi), Oxycodone Psychotropic Info: see discharge summary Insulin Sliding Scale Info: see discharge summary Isolation Precautions Info: n/a Suctioning Needs: n/a   Current Medications (10/19/2022):  This is the current hospital active medication list Current Facility-Administered Medications  Medication Dose Route Frequency Provider Last Rate Last Admin   0.9 %  sodium chloride infusion   Intravenous Continuous Abigail Miyamoto, MD 10 mL/hr at 10/17/22 0617 Infusion Verify at 10/17/22 0617   acetaminophen (TYLENOL) tablet 1,000 mg  1,000 mg Oral TID Adam Phenix, PA-C   1,000 mg at 10/18/22 2144   busPIRone (BUSPAR) tablet 5 mg  5 mg Oral TID PRN Adam Phenix, PA-C   5 mg at 10/18/22 1055   cyanocobalamin (VITAMIN B12) tablet 500 mcg  500 mcg Oral Daily Adam Phenix, PA-C   500 mcg at 10/18/22 1055   enoxaparin (LOVENOX) injection 40 mg  40 mg Subcutaneous Q24H Adam Phenix, PA-C   40 mg at 10/18/22 1445   fluticasone (FLONASE) 50 MCG/ACT nasal spray 2 spray  2 spray Each Nare Daily Adam Phenix, PA-C   2 spray at 10/18/22 2144   gabapentin (NEURONTIN) capsule 100 mg  100 mg Oral BID Adam Phenix, PA-C   100 mg at 10/16/22 1610   levothyroxine (SYNTHROID) tablet 75 mcg  75 mcg Oral Q breakfast Adam Phenix, PA-C   75 mcg at 10/19/22 0644   lidocaine (LIDODERM) 5 % 1 patch  1 patch Transdermal Q24H Adam Phenix, PA-C   1 patch at 10/18/22 1058   loratadine (CLARITIN) tablet 10 mg  10 mg Oral Daily Adam Phenix, PA-C   10 mg at 10/18/22 1055   methocarbamol (ROBAXIN) tablet 500 mg  500 mg Oral Q6H PRN Adam Phenix, PA-C   500 mg at 10/18/22 2145   metoprolol succinate (TOPROL-XL) 24 hr tablet 12.5 mg  12.5 mg Oral Daily Adam Phenix, PA-C    12.5 mg at 10/18/22 1055   ondansetron (ZOFRAN) tablet 4 mg  4 mg Oral Q6H PRN Adam Phenix, PA-C       Or   ondansetron [facility]) injection 4 mg  4 mg Intravenous Q6H PRN Adam Phenix, PA-C   4 mg at 10/17/22 1757   phenol (CHLORASEPTIC) mouth spray 1 spray  1 spray Mouth/Throat PRN Leatha Gilding, MD   1 spray at 10/14/22 1001   prochlorperazine (COMPAZINE) tablet 10 mg  10 mg Oral Q6H PRN Glade Lloyd, MD       rosuvastatin (CRESTOR) tablet 10 mg  10 mg Oral QHS Adam Phenix, PA-C   10 mg at 10/18/22 2145   tapentadol (NUCYNTA) tablet 50 mg  50 mg Oral BID Adam Phenix, PA-C   50 mg at 10/18/22 2145   trimethoprim (TRIMPEX) tablet 100 mg  100 mg Oral Daily Adam Phenix, PA-C   100 mg at 10/18/22 1057     Discharge Medications: Please see discharge summary for a list of discharge medications.  Relevant Imaging Results:  Relevant Lab Results:   Additional Information SS# [id number]  Beckie Busing, RN

## 2022-10-19 NOTE — TOC Progression Note (Addendum)
Transition of Care [facility]) - Progression Note    Patient Details  Name: Kerri [name] MRN: [id number] Date of Birth: Jul 19, 1929  [facility]) CM/SW Contact  Beckie Busing, RN Phone Number:[phone]  10/19/2022, 12:04 PM  Clinical Narrative:    FL2 complete. SNF referral info has been faxed to [facility] and rehab and [facility] nursing center. [facility] re[facility] has responded that the  facility can not make a bed offer at this time. Awaiting response from [place].   1554 Daughter called CM stating that [facility] and rehab did not receive the referral. Cm has called Jeneane in admissions who conforms that CM has correct fax number but states that she does not see the referral. CM has been given a different fax to fax referral.  Referral has been faxed to the new number.    Expected Discharge Plan: Home/Self Care Barriers to Discharge: No SNF bed (To initiate SNF workup)  Expected Discharge Plan and Services       Living arrangements for the past 2 months: Independent Living Facility [facility])                                       Social Determinants of Health (SDOH) Interventions SDOH Screenings   Food Insecurity: No Food Insecurity (10/08/2022)  Housing: Low Risk  (10/08/2022)  Transportation Needs: No Transportation Needs (10/08/2022)  Utilities: Not At Risk (10/08/2022)  Tobacco Use: Medium Risk (10/11/2022)    Readmission Risk Interventions    10/13/2022   10:13 AM 09/11/2021    2:25 PM  Readmission Risk Prevention Plan  Transportation Screening Complete Complete  PCP or Specialist Appt within 5-7 Days Complete   PCP or Specialist Appt within 3-5 Days  Complete  Home Care Screening Complete   Medication Review (RN CM) Complete   HRI or Home Care Consult  Complete  Social Work Consult for Recovery Care Planning/Counseling  Complete  Palliative Care Screening  Complete

## 2022-10-19 NOTE — Progress Notes (Signed)
PROGRESS NOTE    Kerri [name]  ZOX:[id number] DOB: [birth year] DOA: 10/07/2022 PCP: Delma Officer, PA   Brief Narrative:  89 year old female with history of colonic polyps, asthma, basal cell carcinoma of the left forehead, coronary artery disease, depression/anxiety, diverticulosis, hyperlipidemia, fatty liver disease, kidney cancer s/p ablation done in [facility] ,hypertension, hypothyroidism presented with nausea, decreased oral intake, vomiting,wight loss, black stool. CT abdomen/pelvis showed circumferential wall thickening of the ascending colon with distention of the cecum, few enlarged mesenteric lymph nodes. Colonoscopy done on 5/20 showed partially obstructing ascending colon mass concerning for primary colon cancer. Chest CT did not show any signs of metastatic disease. General surgery did laparoscopic assisted ileocolectomy on 5/21.  Pathology showed invasive adenocarcinoma.  Patient does not want any chemotherapy or radiation.  PT is recommending SNF placement.  She is currently medically stable for discharge.  Assessment & Plan:   New diagnosis of invasive adenocarcinoma of colon, T3N0 Goals of care -CT abdomen/pelvis showed circumferential wall thickening of the ascending colon with distention of the cecum, few enlarged mesenteric lymph nodes. Colonoscopy done on 5/20 showed partially obstructing ascending colon mass concerning for primary colon cancer. Chest CT did not show any signs of metastatic disease. General surgery did laparoscopic assisted ileocolectomy on 5/21.  Pathology showed invasive adenocarcinoma.  Patient does not want any chemotherapy or radiation. -General surgery following.  Currently tolerating diet. -Palliative care following: Outpatient follow-up with palliative care.  Anemia of chronic disease -Most likely from above.  Hemoglobin stable.  Monitor intermittently  Hypothyroidism - Continue Synthyroid   Hyperlipidemia - Continue rosuvastatin   Hypokalemia,  hypomagnesemia -Improved   Hypertension - Continue metoprolol   Coronary artery disease - No anginal symptoms.  Continue beta-blocker, statin   Grade 1 diastolic dysfunction - Follows with cardiology as an outpatient. Currently  euvolemic   Recurrent UTI - On trimethoprim   Disposition - Lives at ILF.  PT recommended SNF. TOC following.   DVT prophylaxis: Lovenox Code Status: DNR Family Communication: Daughter at bedside  disposition Plan: Status is: Inpatient Remains inpatient appropriate because: Of need for SNF placement  Consultants: General surgery/palliative care Procedures: As above  Antimicrobials:  Anti-infectives (From admission, onward)    Start     Dose/Rate Route Frequency Ordered Stop   10/10/22 0600  cefoTEtan (CEFOTAN) 2 g in sodium chloride 0.9 % 100 mL IVPB        2 g 200 mL/hr over 30 Minutes Intravenous On call to O.R. 10/09/22 1623 10/11/22 0700   10/07/22 1615  trimethoprim (TRIMPEX) tablet 100 mg        100 mg Oral Daily 10/07/22 1605          Subjective: Patient seen and examined at bedside.  Tolerating diet.  No vomiting, fever, chest pain or worsening shortness breath reported. Objective: Vitals:   10/18/22 0614 10/18/22 1308 10/18/22 2218 10/19/22 0500  BP: 126/74 134/83 (!) 145/79 (!) 146/84  Pulse: 74 86 76   Resp: 18  18 18   Temp: 97.8 F (36.6 C) 97.8 F (36.6 C) 97.6 F (36.4 C) 98.8 F (37.1 C)  TempSrc: Oral  Oral Oral  SpO2: 98% 95% 95% 98%  Weight:      Height:        Intake/Output Summary (Last 24 hours) at 10/19/2022 0726 Last data filed at 10/18/2022 1117 Gross per 24 hour  Intake 471 ml  Output --  Net 471 ml    Filed Weights   10/07/22 1710 10/09/22 1320 10/10/22  1041  Weight: 54.4 kg 54.4 kg 54 kg    Examination:  General: On room air.  No distress.  Elderly female lying in bed.  Looks chronically ill and deconditioned. respiratory: Decreased breath sounds at bases bilaterally with some crackles CVS:  Currently rate controlled; S1-S2 heard  abdominal: Soft, tender mildly, slightly distended, no organomegaly; bowel sounds are heard  extremities: Trace lower extremity edema; no clubbing.     Data Reviewed: I have personally reviewed following labs and imaging studies  CBC: Recent Labs  Lab 10/14/22 1547 10/15/22 0909 10/16/22 0512 10/17/22 0600 10/18/22 0642  WBC 5.4 6.1 6.2 5.8 5.6  HGB 9.3* 9.5* 9.9* 9.5* 9.3*  HCT 28.7* 30.0* 30.0* 26.6* 29.8*  MCV 90.8 87.7 90.6 99.3 91.4  PLT 286 328 318 332 345    Basic Metabolic Panel: Recent Labs  Lab 10/14/22 0707 10/15/22 0909 10/16/22 0512 10/17/22 0600 10/18/22 0642  NA 135 135 134* 136 137  K 3.5 3.1* 2.9* 3.5 3.6  CL 105 104 103 104 104  CO2 25 22 23 26 26   GLUCOSE 119* 122* 111* 106* 103*  BUN 20 12 7* 10 13  CREATININE 0.55 0.64 0.47 0.54 0.64  CALCIUM 7.9* 8.1* 8.0* 8.1* 8.1*  MG  --  1.8 1.5* 2.1 1.8    GFR: Estimated Creatinine Clearance: 39.8 mL/min (by C-G formula based on SCr of 0.64 mg/dL). Liver Function Tests: Recent Labs  Lab 10/15/22 0909  AST 12*  ALT 10  ALKPHOS 47  BILITOT 0.5  PROT 6.0*  ALBUMIN 2.9*    No results for input(s): "LIPASE", "AMYLASE" in the last 168 hours. No results for input(s): "AMMONIA" in the last 168 hours. Coagulation Profile: No results for input(s): "INR", "PROTIME" in the last 168 hours. Cardiac Enzymes: No results for input(s): "CKTOTAL", "CKMB", "CKMBINDEX", "TROPONINI" in the last 168 hours. BNP (last 3 results) No results for input(s): "PROBNP" in the last 8760 hours. HbA1C: No results for input(s): "HGBA1C" in the last 72 hours. CBG: No results for input(s): "GLUCAP" in the last 168 hours. Lipid Profile: No results for input(s): "CHOL", "HDL", "LDLCALC", "TRIG", "CHOLHDL", "LDLDIRECT" in the last 72 hours. Thyroid Function Tests: No results for input(s): "TSH", "T4TOTAL", "FREET4", "T3FREE", "THYROIDAB" in the last 72 hours. Anemia Panel: No results  for input(s): "VITAMINB12", "FOLATE", "FERRITIN", "TIBC", "IRON", "RETICCTPCT" in the last 72 hours. Sepsis Labs: No results for input(s): "PROCALCITON", "LATICACIDVEN" in the last 168 hours.  Recent Results (from the past 240 hour(s))  Surgical pcr screen     Status: None   Collection Time: 10/10/22 11:25 AM   Specimen: Nasal Mucosa; Nasal Swab  Result Value Ref Range Status   MRSA, PCR NEGATIVE NEGATIVE Final   Staphylococcus aureus NEGATIVE NEGATIVE Final    Comment: (NOTE) The Xpert SA Assay (FDA approved for NASAL specimens in patients 41 years of age and older), is one component of a comprehensive surveillance program. It is not intended to diagnose infection nor to guide or monitor treatment. Performed at [facility], [address] [place], [place] [address]          Radiology Studies: No results found.      Scheduled Meds:  acetaminophen  1,000 mg Oral TID   cyanocobalamin  500 mcg Oral Daily   enoxaparin (LOVENOX) injection  40 mg Subcutaneous Q24H   fluticasone  2 spray Each Nare Daily   gabapentin  100 mg Oral BID   levothyroxine  75 mcg Oral Q breakfast  lidocaine  1 patch Transdermal Q24H   loratadine  10 mg Oral Daily   metoprolol succinate  12.5 mg Oral Daily   rosuvastatin  10 mg Oral QHS   tapentadol  50 mg Oral BID   trimethoprim  100 mg Oral Daily   Continuous Infusions:  sodium chloride 10 mL/hr at 10/17/22 0617          Glade Lloyd, MD [facility] 10/19/2022, 7:26 AM

## 2022-10-19 NOTE — [place] FL2 (Deleted)
[place] MEDICAID FL2 LEVEL OF CARE FORM     IDENTIFICATION  Patient Name: Kerri [name] Birthdate: December 07, 1929 Sex: female Admission Date (Current Location): 10/07/2022  [facility] and [place] Number:  Producer, television/film/video and Address:  [facility],  [address] [place], [place] [address]      Provider Number: [id number]  Attending Physician Name and Address:  Glade Lloyd, MD  Relative Name and Phone Number:  Daisy Lazar   [phone]    Current Level of Care: Hospital Recommended Level of Care: Skilled Nursing Facility Prior Approval Number:    Date Approved/Denied:   PASRR Number: [id number]  Discharge Plan: SNF    Current Diagnoses: Patient Active Problem List   Diagnosis Date Noted   Palliative care encounter 10/10/2022   Goals of care, counseling/discussion 10/10/2022   Need for emotional support 10/10/2022   Counseling and coordination of care 10/10/2022   Colonic mass 10/09/2022   Benign neoplasm of colon 10/09/2022   Abnormal CT of the abdomen 10/08/2022   Generalized abdominal pain 10/08/2022   Loss of weight 10/08/2022   Nausea and vomiting 10/08/2022   Lesion of colon 10/07/2022   Grade I diastolic dysfunction 10/07/2022   Pressure injury of skin 12/20/2021   Syncope and collapse 12/19/2021   Chronic pain disorder 12/19/2021   DNR (do not resuscitate) 12/19/2021   Folate deficiency 09/10/2021   Acute UTI (urinary tract infection) 09/09/2021   Ptosis of right eyelid 09/09/2021   AKI (acute kidney injury) (HCC) 08/15/2021   Acute metabolic encephalopathy 08/15/2021   Lactic acidosis 08/15/2021   Protein-calorie malnutrition, severe 08/12/2021   Aggression    Normocytic anemia 08/11/2021   NSVT (nonsustained ventricular tachycardia) (HCC) 08/10/2021   Elevated troponin 08/10/2021   UTI (urinary tract infection) 08/10/2021   Hip fracture (HCC) 08/09/2021   Elevated CK 08/09/2021   Dehydration 08/09/2021   Anxiety 06/17/2021   History of  recurrent UTIs 07/08/2020   SOB (shortness of breath) 04/07/2020   Primary osteoarthritis of right knee 10/06/2019   GERD (gastroesophageal reflux disease) 06/06/2019   Palpitations 03/29/2019   Failed total hip arthroplasty (HCC) 11/27/2018   Melanoma (HCC)    Dizziness 01/30/2017   Lumbar stenosis with neurogenic claudication 11/13/2013   Sebaceous cyst 11/20/2012   Squamous cell carcinoma in situ of skin of calf 04/03/2012     Hypokalemia 09/08/2011   Postop Acute blood loss anemia 09/05/2011   Osteoarthritis of hip 09/04/2011   Melanoma of lower leg (HCC) 05/03/2011   PVC's (premature ventricular contractions) 07/30/2009   Hypothyroidism 05/10/2009   Hyperlipidemia 05/10/2009   Essential hypertension 05/10/2009   Coronary atherosclerosis 05/10/2009   OA (osteoarthritis) of knee 05/10/2009   SPINAL STENOSIS 05/10/2009   Carotid bruit 05/10/2009    Orientation RESPIRATION BLADDER Height & Weight     Self, Time, Situation, Place  Normal Continent Weight: 54 kg Height:  5\' 5"  (165.1 cm)  BEHAVIORAL SYMPTOMS/MOOD NEUROLOGICAL BOWEL NUTRITION STATUS     (n/a) Continent Diet  AMBULATORY STATUS COMMUNICATION OF NEEDS Skin   Limited Assist Verbally Normal, Surgical wounds (closed surgical incision to abdomen)                       Personal Care Assistance Level of Assistance  Bathing, Feeding, Dressing Bathing Assistance: Limited assistance Feeding assistance: Independent Dressing Assistance: Limited assistance     Functional Limitations Info  Sight, Hearing, Speech Sight Info: Adequate Hearing Info: Adequate Speech Info: Adequate    SPECIAL  CARE FACTORS FREQUENCY  PT (By licensed PT), OT (By licensed OT)     PT Frequency: 5X/wk OT Frequency: 5X/wk            Contractures Contractures Info: Not present    Additional Factors Info  Code Status, Allergies, Psychotropic, Insulin Sliding Scale, Isolation Precautions, Suctioning Needs Code Status Info:  DNR Allergies Info: Buprenorphine Hcl, Codeine, Morphine, Tramadol, Adhesive (Tape), Doxycycline Hyclate, Hydrocodone-acetaminophen, Other, Oxycodone Hcl, Tapentadol, Lactose Intolerance (Gi), Oxycodone Psychotropic Info: see discharge summary Insulin Sliding Scale Info: see discharge summary Isolation Precautions Info: n/a Suctioning Needs: n/a   Current Medications (10/19/2022):  This is the current hospital active medication list Current Facility-Administered Medications  Medication Dose Route Frequency Provider Last Rate Last Admin   0.9 %  sodium chloride infusion   Intravenous Continuous Abigail Miyamoto, MD 10 mL/hr at 10/17/22 0617 Infusion Verify at 10/17/22 0617   acetaminophen (TYLENOL) tablet 1,000 mg  1,000 mg Oral TID Adam Phenix, PA-C   1,000 mg at 10/19/22 1053   busPIRone (BUSPAR) tablet 5 mg  5 mg Oral TID PRN Adam Phenix, PA-C   5 mg at 10/18/22 1055   cyanocobalamin (VITAMIN B12) tablet 500 mcg  500 mcg Oral Daily Adam Phenix, PA-C   500 mcg at 10/19/22 1053   enoxaparin (LOVENOX) injection 40 mg  40 mg Subcutaneous Q24H Adam Phenix, PA-C   40 mg at 10/18/22 1445   fluticasone (FLONASE) 50 MCG/ACT nasal spray 2 spray  2 spray Each Nare Daily Adam Phenix, PA-C   2 spray at 10/18/22 2144   gabapentin (NEURONTIN) capsule 100 mg  100 mg Oral BID Adam Phenix, PA-C   100 mg at 10/16/22 1610   levothyroxine (SYNTHROID) tablet 75 mcg  75 mcg Oral Q breakfast Adam Phenix, PA-C   75 mcg at 10/19/22 0644   lidocaine (LIDODERM) 5 % 1 patch  1 patch Transdermal Q24H Adam Phenix, PA-C   1 patch at 10/19/22 1049   loratadine (CLARITIN) tablet 10 mg  10 mg Oral Daily Adam Phenix, PA-C   10 mg at 10/19/22 1053   methocarbamol (ROBAXIN) tablet 500 mg  500 mg Oral Q6H PRN Adam Phenix, PA-C   500 mg at 10/18/22 2145   metoprolol succinate (TOPROL-XL) 24 hr tablet 12.5 mg  12.5 mg Oral Daily Adam Phenix, PA-C    12.5 mg at 10/19/22 1052   ondansetron (ZOFRAN) tablet 4 mg  4 mg Oral Q6H PRN Adam Phenix, PA-C       Or   ondansetron [facility]) injection 4 mg  4 mg Intravenous Q6H PRN Adam Phenix, PA-C   4 mg at 10/17/22 1757   phenol (CHLORASEPTIC) mouth spray 1 spray  1 spray Mouth/Throat PRN Leatha Gilding, MD   1 spray at 10/14/22 1001   prochlorperazine (COMPAZINE) tablet 10 mg  10 mg Oral Q6H PRN Glade Lloyd, MD       rosuvastatin (CRESTOR) tablet 10 mg  10 mg Oral QHS Adam Phenix, PA-C   10 mg at 10/18/22 2145   tapentadol (NUCYNTA) tablet 50 mg  50 mg Oral BID Adam Phenix, PA-C   50 mg at 10/19/22 1053   trimethoprim (TRIMPEX) tablet 100 mg  100 mg Oral Daily Adam Phenix, PA-C   100 mg at 10/19/22 1053     Discharge Medications: Please see discharge summary for a list of discharge medications.  Relevant Imaging Results:  Relevant Lab Results:   Additional Information SS# [id number]  Beckie Busing, RN

## 2022-10-19 NOTE — Progress Notes (Signed)
9 Days Post-Op   Subjective/Chief Complaint: No complaints    Objective: Vital signs in last 24 hours: Temp:  [97.6 F (36.4 C)-98.8 F (37.1 C)] 98.8 F (37.1 C) (05/30 0500) Pulse Rate:  [76-86] 76 (05/29 2218) Resp:  [18] 18 (05/30 0500) BP: (134-146)/(79-84) 146/84 (05/30 0500) SpO2:  [95 %-98 %] 98 % (05/30 0500) Last BM Date : 10/16/22  Intake/Output from previous day: 05/29 0701 - 05/30 0700 In: 471 [P.O.:240; I.V.:231] Out: -  Intake/Output this shift: No intake/output data recorded.  Incision CDI soft NT   Lab Results:  Recent Labs    10/17/22 0600 10/18/22 0642  WBC 5.8 5.6  HGB 9.5* 9.3*  HCT 26.6* 29.8*  PLT 332 345   BMET Recent Labs    10/17/22 0600 10/18/22 0642  NA 136 137  K 3.5 3.6  CL 104 104  CO2 26 26  GLUCOSE 106* 103*  BUN 10 13  CREATININE 0.54 0.64  CALCIUM 8.1* 8.1*   PT/INR No results for input(s): "LABPROT", "INR" in the last 72 hours. ABG No results for input(s): "PHART", "HCO3" in the last 72 hours.  Invalid input(s): "PCO2", "PO2"  Studies/Results: No results found.  Anti-infectives: Anti-infectives (From admission, onward)    Start     Dose/Rate Route Frequency Ordered Stop   10/10/22 0600  cefoTEtan (CEFOTAN) 2 g in sodium chloride 0.9 % 100 mL IVPB        2 g 200 mL/hr over 30 Minutes Intravenous On call to O.R. 10/09/22 1623 10/11/22 0700   10/07/22 1615  trimethoprim (TRIMPEX) tablet 100 mg        100 mg Oral Daily 10/07/22 1605         Assessment/Plan: s/p Procedure(s): COLON RESECTION LAPAROSCOPIC (N/A) Stable for D/C from surgery perspective Path reviewed   Will see as needed unless status changes  FOLLOW UP DEPENDENT ON WHERE PT GOES POST OP    LOS: 11 days    Dortha Schwalbe MD  10/19/2022

## 2022-10-19 NOTE — Progress Notes (Signed)
Physical Therapy Treatment Patient Details Name: Kerri [name] MRN: [id number] DOB: 05/11/1930 Today's Date: 10/19/2022   History of Present Illness Pt is 89 yo female on 10/07/22 with colonic neoplasm s/p laparoscopic assisted ileocolectomy on 10/10/22.  Pt with hx including colonic polyps, asthma, basal cell carcinoma of the left forehead, coronary artery disease, depression/anxiety, diverticulosis, hyperlipidemia, fatty liver disease, kidney cancer s/p ablation done in [facility] ,hypertension, hypothyroidism    PT Comments    Session focus on strengthening exercise, pt reports ambulating with daughter who is a nurse earlier this morning and washing up with nursing, so agreeable to exercises in room. Pt tolerates LAQ and STS reps with good muscle activation noted, cues for decreased momentum. Pt powers to stand with BUE assisting, maintains trunk slightly flexed forward, no LOB, cues for hand placement. Pt with some facial grimacing reporting some abdominal pain with movement, but tolerable. RN in room with pt's medication, will continue to progress as able.   Recommendations for follow up therapy are one component of a multi-disciplinary discharge planning process, led by the attending physician.  Recommendations may be updated based on patient status, additional functional criteria and insurance authorization.  Follow Up Recommendations  Can patient physically be transported by private vehicle: Yes    Assistance Recommended at Discharge Frequent or constant Supervision/Assistance  Patient can return home with the following A little help with walking and/or transfers;A little help with bathing/dressing/bathroom;Assistance with cooking/housework;Assist for transportation   Equipment Recommendations  None recommended by PT    Recommendations for Other Services       Precautions / Restrictions Precautions Precautions: Fall Restrictions Weight Bearing Restrictions: No     Mobility  Bed  Mobility               General bed mobility comments: sitting EOB upon arrival    Transfers Overall transfer level: Needs assistance Equipment used: Rolling walker (2 wheels) Transfers: Sit to/from Stand, Bed to chair/wheelchair/BSC Sit to Stand: Min guard   Step pivot transfers: Min guard       General transfer comment: verbal cues for hand placement, pt performs STS and step pivot transfer with min G, maintains trunk slightly flexed    Ambulation/Gait               General Gait Details: pt reports ambulating with daughter earlier this morning, agreeable to exercises in room now   Stairs             Wheelchair Mobility    Modified Rankin (Stroke Patients Only)       Balance Overall balance assessment: Needs assistance Sitting-balance support: Feet supported Sitting balance-Leahy Scale: Good     Standing balance support: No upper extremity supported Standing balance-Leahy Scale: Fair Standing balance comment: fair static without UE support                            Cognition Arousal/Alertness: Awake/alert Behavior During Therapy: WFL for tasks assessed/performed Overall Cognitive Status: Within Functional Limits for tasks assessed                                          Exercises General Exercises - Lower Extremity Long Arc Quad: Seated, AROM, Strengthening, Both, 10 reps Other Exercises Other Exercises: STS 5 reps with RW    General Comments  Pertinent Vitals/Pain Pain Assessment Pain Assessment: Faces Faces Pain Scale: Hurts a little bit Pain Location: abdomen Pain Descriptors / Indicators: Grimacing, Guarding Pain Intervention(s): Limited activity within patient's tolerance, Monitored during session    Home Living                          Prior Function            PT Goals (current goals can now be found in the care plan section) Acute Rehab PT Goals Patient Stated Goal: To  die comfortably PT Goal Formulation: With patient Time For Goal Achievement: 10/25/22 Potential to Achieve Goals: Good Progress towards PT goals: Progressing toward goals    Frequency    Min 1X/week      PT Plan Current plan remains appropriate    Co-evaluation              AM-PAC PT "6 Clicks" Mobility   Outcome Measure  Help needed turning from your back to your side while in a flat bed without using bedrails?: A Lot Help needed moving from lying on your back to sitting on the side of a flat bed without using bedrails?: A Lot Help needed moving to and from a bed to a chair (including a wheelchair)?: A Little Help needed standing up from a chair using your arms (e.g., wheelchair or bedside chair)?: A Little Help needed to walk in hospital room?: A Little Help needed climbing 3-5 steps with a railing? : A Lot 6 Click Score: 15    End of Session   Activity Tolerance: Patient tolerated treatment well Patient left: in bed;with call bell/phone within reach;with nursing/sitter in room Nurse Communication: Mobility status PT Visit Diagnosis: Other abnormalities of gait and mobility (R26.89);Muscle weakness (generalized) (M62.81)     Time: 1610-9604 PT Time Calculation (min) (ACUTE ONLY): 18 min  Charges:  $Therapeutic Exercise: 8-22 mins                      Tori Sakai Heinle PT, DPT 10/19/22, 11:12 AM

## 2022-10-20 DIAGNOSIS — K6389 Other specified diseases of intestine: Secondary | ICD-10-CM | POA: Diagnosis not present

## 2022-10-20 DIAGNOSIS — Z87440 Personal history of urinary (tract) infections: Secondary | ICD-10-CM

## 2022-10-20 MED ORDER — SODIUM CHLORIDE 0.9 % IV SOLN
1.0000 g | INTRAVENOUS | Status: DC
  Administered 2022-10-21 – 2022-10-22 (×2): 1 g via INTRAVENOUS
  Filled 2022-10-20 (×3): qty 10

## 2022-10-20 NOTE — Progress Notes (Signed)
Physical Therapy Treatment Patient Details Name: Kerri [name] MRN: [id number] DOB: Jan 17, 1930 Today's Date: 10/20/2022   History of Present Illness Pt is 89 yo female on 10/07/22 with colonic neoplasm s/p laparoscopic assisted ileocolectomy on 10/10/22.  Pt with hx including colonic polyps, asthma, basal cell carcinoma of the left forehead, coronary artery disease, depression/anxiety, diverticulosis, hyperlipidemia, fatty liver disease, kidney cancer s/p ablation done in [facility] ,hypertension, hypothyroidism    PT Comments    Initial education on benefit of PT and encouragement for participation, then pt agreeable. Pt reaching for therapist to pull pt up to sitting EOB, PT educating pt on rolling to side, using bedrail and uprighting trunk and able to do so with min G. Pt then powers to stand with RW, min G for steadying, educated pt on scooting forward in seat, forward weight shift, engaging BLE down into floor to power up with good results. Pt demo very slow, short steps with RW, narrow BOS, trunk flexed, no overt LOB but needing significant increased time to ambulate. Once back in room, pt tolerates seated exercises and STS reps working on BLE strength and endurance. Pt remains up in recliner with nursing student in room at EOS. Pt with improvement this session, but still needing steadying assist and verbal cues with mobility to decrease risk for falls.   Recommendations for follow up therapy are one component of a multi-disciplinary discharge planning process, led by the attending physician.  Recommendations may be updated based on patient status, additional functional criteria and insurance authorization.  Follow Up Recommendations  Can patient physically be transported by private vehicle: Yes    Assistance Recommended at Discharge Frequent or constant Supervision/Assistance  Patient can return home with the following A little help with walking and/or transfers;A little help with  bathing/dressing/bathroom;Assistance with cooking/housework;Assist for transportation   Equipment Recommendations  None recommended by PT    Recommendations for Other Services       Precautions / Restrictions Precautions Precautions: Fall Restrictions Weight Bearing Restrictions: No     Mobility  Bed Mobility Overal bed mobility: Needs Assistance Bed Mobility: Supine to Sit     Supine to sit: Min guard     General bed mobility comments: pt reaches for therapist's hand to pull her up into sitting, therapist educates pt on reaching across body to bedrail and coming to sidelying position to then push self up into sitting at EOB, no physical assist, min guard for safety    Transfers Overall transfer level: Needs assistance Equipment used: Rolling walker (2 wheels) Transfers: Sit to/from Stand Sit to Stand: Min guard           General transfer comment: cues for hand placement, pt powers up to standing with BUE assisting, hip extension last, verbal cues for wider BOS, sliding forward before powering up and BLE engagement to power up into standing    Ambulation/Gait Ambulation/Gait assistance: Min guard Gait Distance (Feet): 50 Feet Assistive device: Rolling walker (2 wheels) Gait Pattern/deviations: Step-to pattern, Decreased stride length, Narrow base of support, Trunk flexed Gait velocity: decreased     General Gait Details: very slow and short steps with narrow BOS, minimal bil foot clearance, trunk flexed, no overt LOB or physical assist   Stairs             Wheelchair Mobility    Modified Rankin (Stroke Patients Only)       Balance Overall balance assessment: Needs assistance Sitting-balance support: Feet supported Sitting balance-Leahy Scale: Good  Standing balance support: No upper extremity supported Standing balance-Leahy Scale: Fair Standing balance comment: fair static without UE support                             Cognition Arousal/Alertness: Awake/alert Behavior During Therapy: WFL for tasks assessed/performed Overall Cognitive Status: Within Functional Limits for tasks assessed                                          Exercises General Exercises - Lower Extremity Long Arc Quad: Seated, AROM, Strengthening, Both, 10 reps (2 sets) Hip Flexion/Marching: Seated, AROM, Strengthening, Both, 10 reps (2 sets) Other Exercises Other Exercises: STS x7 reps with RW    General Comments        Pertinent Vitals/Pain Pain Assessment Pain Assessment: Faces Faces Pain Scale: Hurts a little bit Pain Location: abdomen Pain Descriptors / Indicators: Grimacing, Guarding Pain Intervention(s): Limited activity within patient's tolerance, Monitored during session, Repositioned    Home Living                          Prior Function            PT Goals (current goals can now be found in the care plan section) Acute Rehab PT Goals Patient Stated Goal: To die comfortably PT Goal Formulation: With patient Time For Goal Achievement: 10/25/22 Potential to Achieve Goals: Good Progress towards PT goals: Progressing toward goals    Frequency    Min 1X/week      PT Plan Current plan remains appropriate    Co-evaluation              AM-PAC PT "6 Clicks" Mobility   Outcome Measure  Help needed turning from your back to your side while in a flat bed without using bedrails?: A Little Help needed moving from lying on your back to sitting on the side of a flat bed without using bedrails?: A Little Help needed moving to and from a bed to a chair (including a wheelchair)?: A Little Help needed standing up from a chair using your arms (e.g., wheelchair or bedside chair)?: A Little Help needed to walk in hospital room?: A Little Help needed climbing 3-5 steps with a railing? : A Lot 6 Click Score: 17    End of Session Equipment Utilized During Treatment: Gait  belt Activity Tolerance: Patient tolerated treatment well Patient left: in chair;with call bell/phone within reach;with nursing/sitter in room Nurse Communication: Mobility status PT Visit Diagnosis: Other abnormalities of gait and mobility (R26.89);Muscle weakness (generalized) (M62.81)     Time: 4098-1191 PT Time Calculation (min) (ACUTE ONLY): 37 min  Charges:  $Gait Training: 8-22 mins $Therapeutic Exercise: 8-22 mins                      Tori Deatrice Spanbauer PT, DPT 10/20/22, 10:56 AM

## 2022-10-20 NOTE — Progress Notes (Addendum)
PROGRESS NOTE    Kerri [name]  UEA:[id number] DOB: 01-29-1930 DOA: 10/07/2022 PCP: Delma Officer, PA   Brief Narrative:  89 year old female with history of colonic polyps, asthma, basal cell carcinoma of the left forehead, coronary artery disease, depression/anxiety, diverticulosis, hyperlipidemia, fatty liver disease, kidney cancer s/p ablation done in [facility] ,hypertension, hypothyroidism presented with nausea, decreased oral intake, vomiting,wight loss, black stool. CT abdomen/pelvis showed circumferential wall thickening of the ascending colon with distention of the cecum, few enlarged mesenteric lymph nodes. Colonoscopy done on 5/20 showed partially obstructing ascending colon mass concerning for primary colon cancer. Chest CT did not show any signs of metastatic disease. General surgery did laparoscopic assisted ileocolectomy on 5/21.  Pathology showed invasive adenocarcinoma.  Patient does not want any chemotherapy or radiation.  PT is recommending SNF placement.  She is currently medically stable for discharge.  Assessment & Plan:   New diagnosis of invasive adenocarcinoma of colon, T3N0 Goals of care -CT abdomen/pelvis showed circumferential wall thickening of the ascending colon with distention of the cecum, few enlarged mesenteric lymph nodes. Colonoscopy done on 5/20 showed partially obstructing ascending colon mass concerning for primary colon cancer. Chest CT did not show any signs of metastatic disease. General surgery did laparoscopic assisted ileocolectomy on 5/21.  Pathology showed invasive adenocarcinoma.  Patient does not want any chemotherapy or radiation. -General surgery following.  Currently tolerating diet. -Palliative care following: Outpatient follow-up with palliative care.  Anemia of chronic disease -Most likely from above.  Hemoglobin stable.  Monitor intermittently  Hypothyroidism - Continue Synthyroid   Hyperlipidemia - Continue rosuvastatin   Hypokalemia,  hypomagnesemia -Improved   Hypertension - Continue metoprolol   Coronary artery disease - No anginal symptoms.  Continue beta-blocker, statin   Grade 1 diastolic dysfunction - Follows with cardiology as an outpatient. Currently  euvolemic   Recurrent UTI - On trimethoprim -Patient possibly has a new UTI with dysuria and increased frequency.  UA suggestive of UTI.  Sending urine culture.  Start Rocephin.   Physical deconditioning  - Lives at ILF.  PT recommended SNF. TOC following.   DVT prophylaxis: Lovenox Code Status: DNR Family Communication: Daughter at bedside on 10/19/2022 disposition Plan: Status is: Inpatient Remains inpatient appropriate because: Of need for SNF placement  Consultants: General surgery/palliative care Procedures: As above  Antimicrobials:  Anti-infectives (From admission, onward)    Start     Dose/Rate Route Frequency Ordered Stop   10/10/22 0600  cefoTEtan (CEFOTAN) 2 g in sodium chloride 0.9 % 100 mL IVPB        2 g 200 mL/hr over 30 Minutes Intravenous On call to O.R. 10/09/22 1623 10/11/22 0700   10/07/22 1615  trimethoprim (TRIMPEX) tablet 100 mg        100 mg Oral Daily 10/07/22 1605          Subjective: Patient seen and examined at bedside.  No fever, chest pain, shortness of breath reported.   Objective: Vitals:   10/19/22 1052 10/19/22 1446 10/19/22 2206 10/20/22 0528  BP: 123/82 125/77 129/68 133/75  Pulse: 97 84 82 72  Resp:  18 18 18   Temp:  98 F (36.7 C) 98.9 F (37.2 C) (!) 97.5 F (36.4 C)  TempSrc:  Oral Oral Oral  SpO2:  100% 97% 99%  Weight:      Height:        Intake/Output Summary (Last 24 hours) at 10/20/2022 0743 Last data filed at 10/19/2022 1312 Gross per 24 hour  Intake 120 ml  Output --  Net 120 ml    Filed Weights   10/07/22 1710 10/09/22 1320 10/10/22 1041  Weight: 54.4 kg 54.4 kg 54 kg    Examination:  General: No acute distress.  Currently on room air.  Elderly female lying in bed.  Looks  chronically ill and deconditioned. respiratory: Bilateral decreased breath sounds at bases with scattered crackles CVS: S1-S2 heard; rate controlled mostly abdominal: Soft, mildly tender; distended mildly; no organomegaly; bowel sounds are heard normally extremities: No cyanosis; mild lower extremity edema present   Data Reviewed: I have personally reviewed following labs and imaging studies  CBC: Recent Labs  Lab 10/14/22 1547 10/15/22 0909 10/16/22 0512 10/17/22 0600 10/18/22 0642  WBC 5.4 6.1 6.2 5.8 5.6  HGB 9.3* 9.5* 9.9* 9.5* 9.3*  HCT 28.7* 30.0* 30.0* 26.6* 29.8*  MCV 90.8 87.7 90.6 99.3 91.4  PLT 286 328 318 332 345    Basic Metabolic Panel: Recent Labs  Lab 10/14/22 0707 10/15/22 0909 10/16/22 0512 10/17/22 0600 10/18/22 0642  NA 135 135 134* 136 137  K 3.5 3.1* 2.9* 3.5 3.6  CL 105 104 103 104 104  CO2 25 22 23 26 26   GLUCOSE 119* 122* 111* 106* 103*  BUN 20 12 7* 10 13  CREATININE 0.55 0.64 0.47 0.54 0.64  CALCIUM 7.9* 8.1* 8.0* 8.1* 8.1*  MG  --  1.8 1.5* 2.1 1.8    GFR: Estimated Creatinine Clearance: 39.8 mL/min (by C-G formula based on SCr of 0.64 mg/dL). Liver Function Tests: Recent Labs  Lab 10/15/22 0909  AST 12*  ALT 10  ALKPHOS 47  BILITOT 0.5  PROT 6.0*  ALBUMIN 2.9*    No results for input(s): "LIPASE", "AMYLASE" in the last 168 hours. No results for input(s): "AMMONIA" in the last 168 hours. Coagulation Profile: No results for input(s): "INR", "PROTIME" in the last 168 hours. Cardiac Enzymes: No results for input(s): "CKTOTAL", "CKMB", "CKMBINDEX", "TROPONINI" in the last 168 hours. BNP (last 3 results) No results for input(s): "PROBNP" in the last 8760 hours. HbA1C: No results for input(s): "HGBA1C" in the last 72 hours. CBG: No results for input(s): "GLUCAP" in the last 168 hours. Lipid Profile: No results for input(s): "CHOL", "HDL", "LDLCALC", "TRIG", "CHOLHDL", "LDLDIRECT" in the last 72 hours. Thyroid Function  Tests: No results for input(s): "TSH", "T4TOTAL", "FREET4", "T3FREE", "THYROIDAB" in the last 72 hours. Anemia Panel: No results for input(s): "VITAMINB12", "FOLATE", "FERRITIN", "TIBC", "IRON", "RETICCTPCT" in the last 72 hours. Sepsis Labs: No results for input(s): "PROCALCITON", "LATICACIDVEN" in the last 168 hours.  Recent Results (from the past 240 hour(s))  Surgical pcr screen     Status: None   Collection Time: 10/10/22 11:25 AM   Specimen: Nasal Mucosa; Nasal Swab  Result Value Ref Range Status   MRSA, PCR NEGATIVE NEGATIVE Final   Staphylococcus aureus NEGATIVE NEGATIVE Final    Comment: (NOTE) The Xpert SA Assay (FDA approved for NASAL specimens in patients 68 years of age and older), is one component of a comprehensive surveillance program. It is not intended to diagnose infection nor to guide or monitor treatment. Performed at [facility], [address] [place], [place] [address]          Radiology Studies: No results found.      Scheduled Meds:  acetaminophen  1,000 mg Oral TID   cyanocobalamin  500 mcg Oral Daily   enoxaparin (LOVENOX) injection  40 mg Subcutaneous Q24H   fluticasone  2 spray Each Nare  Daily   gabapentin  100 mg Oral BID   levothyroxine  75 mcg Oral Q breakfast   lidocaine  1 patch Transdermal Q24H   loratadine  10 mg Oral Daily   metoprolol succinate  12.5 mg Oral Daily   rosuvastatin  10 mg Oral QHS   tapentadol  50 mg Oral BID   trimethoprim  100 mg Oral Daily   Continuous Infusions:  sodium chloride 10 mL/hr at 10/17/22 0617          Glade Lloyd, MD [facility] 10/20/2022, 7:43 AM

## 2022-10-20 NOTE — TOC Progression Note (Addendum)
Transition of Care [facility]) - Progression Note    Patient Details  Name: Meeghan Helt MRN: [id number] Date of Birth: [birth year]  [facility] CM/SW Contact  Beckie Busing, RN Phone Number:[phone]  10/20/2022, 10:28 AM  Clinical Narrative:    CM received return call from [place] with admissions at who confirms that [facility] can offer a bed. CM will initiate insurance auth.   1050 Daughter Alvis Lemmings has called CM to make her aware that facility can accept patient. CM made daughter aware that CM has spoken with admissions director. Daughter wants to set up ambulance transportation to facility and is asking CM for specific dated. CM has advised daughter that CM can not give her a time because CM will need to initiate insurance auth and can not guarantee a time of discharge. Daughter verbalized understanding.   [id number] Insurance Berkley Harvey has been initiated via [place] portal auth id [id number]   Expected Discharge Plan: Home/Self Care Barriers to Discharge: No SNF bed (To initiate SNF workup)  Expected Discharge Plan and Services       Living arrangements for the past 2 months: Independent Living Facility [facility])                                       Social Determinants of Health (SDOH) Interventions SDOH Screenings   Food Insecurity: No Food Insecurity (10/08/2022)  Housing: Low Risk  (10/08/2022)  Transportation Needs: No Transportation Needs (10/08/2022)  Utilities: Not At Risk (10/08/2022)  Tobacco Use: Medium Risk (10/11/2022)    Readmission Risk Interventions    10/13/2022   10:13 AM 09/11/2021    2:25 PM  Readmission Risk Prevention Plan  Transportation Screening Complete Complete  PCP or Specialist Appt within 5-7 Days Complete   PCP or Specialist Appt within 3-5 Days  Complete  Home Care Screening Complete   Medication Review (RN CM) Complete   HRI or Home Care Consult  Complete  Social Work Consult for Recovery Care Planning/Counseling  Complete   Palliative Care Screening  Complete

## 2022-10-20 NOTE — Progress Notes (Signed)
10 Days Post-Op   Subjective/Chief Complaint: Patient without complaint.  Pain controlled.  Bowels functioning   Objective: Vital signs in last 24 hours: Temp:  [97.5 F (36.4 C)-98.9 F (37.2 C)] 97.5 F (36.4 C) (05/31 0528) Pulse Rate:  [72-97] 72 (05/31 0528) Resp:  [18] 18 (05/31 0528) BP: (123-133)/(68-82) 133/75 (05/31 0528) SpO2:  [97 %-100 %] 99 % (05/31 0528) Last BM Date : 10/16/22  Intake/Output from previous day: 05/30 0701 - 05/31 0700 In: 120 [P.O.:120] Out: -  Intake/Output this shift: No intake/output data recorded.  Abdomen: Soft nontender.  Incision clean dry intact.  Lab Results:  Recent Labs    10/18/22 0642  WBC 5.6  HGB 9.3*  HCT 29.8*  PLT 345   BMET Recent Labs    10/18/22 0642  NA 137  K 3.6  CL 104  CO2 26  GLUCOSE 103*  BUN 13  CREATININE 0.64  CALCIUM 8.1*   PT/INR No results for input(s): "LABPROT", "INR" in the last 72 hours. ABG No results for input(s): "PHART", "HCO3" in the last 72 hours.  Invalid input(s): "PCO2", "PO2"  Studies/Results: No results found.  Anti-infectives: Anti-infectives (From admission, onward)    Start     Dose/Rate Route Frequency Ordered Stop   10/10/22 0600  cefoTEtan (CEFOTAN) 2 g in sodium chloride 0.9 % 100 mL IVPB        2 g 200 mL/hr over 30 Minutes Intravenous On call to O.R. 10/09/22 1623 10/11/22 0700   10/07/22 1615  trimethoprim (TRIMPEX) tablet 100 mg        100 mg Oral Daily 10/07/22 1605         Assessment/Plan: s/p Procedure(s): COLON RESECTION LAPAROSCOPIC (N/A) Stable surgically  May be placed when medically fit  Discharge planning working with daughter to place patient closer to daughter's home which is in [place].  LOS: 12 days    Dortha Schwalbe MD  10/20/2022

## 2022-10-21 DIAGNOSIS — K6389 Other specified diseases of intestine: Secondary | ICD-10-CM | POA: Diagnosis not present

## 2022-10-21 LAB — URINE CULTURE: Culture: >=100000 — AB

## 2022-10-21 NOTE — Plan of Care (Signed)
  Problem: Health Behavior/Discharge Planning: Goal: Ability to manage health-related needs will improve Outcome: Progressing   Problem: Clinical Measurements: Goal: Ability to maintain clinical measurements within normal limits will improve Outcome: Progressing   Problem: Clinical Measurements: Goal: Will remain free from infection Outcome: Progressing   

## 2022-10-21 NOTE — Progress Notes (Signed)
PROGRESS NOTE    Kerri [name]  WUJ:[id number] DOB: [birth year] DOA: 10/07/2022 PCP: Delma Officer, PA   Brief Narrative:  89 year old female with history of colonic polyps, asthma, basal cell carcinoma of the left forehead, coronary artery disease, depression/anxiety, diverticulosis, hyperlipidemia, fatty liver disease, kidney cancer s/p ablation done in [facility] ,hypertension, hypothyroidism presented with nausea, decreased oral intake, vomiting,wight loss, black stool. CT abdomen/pelvis showed circumferential wall thickening of the ascending colon with distention of the cecum, few enlarged mesenteric lymph nodes. Colonoscopy done on 5/20 showed partially obstructing ascending colon mass concerning for primary colon cancer. Chest CT did not show any signs of metastatic disease. General surgery did laparoscopic assisted ileocolectomy on 5/21.  Pathology showed invasive adenocarcinoma.  Patient does not want any chemotherapy or radiation.  PT is recommending SNF placement.  She is currently medically stable for discharge.  Assessment & Plan:   New diagnosis of invasive adenocarcinoma of colon, T3N0 Goals of care -CT abdomen/pelvis showed circumferential wall thickening of the ascending colon with distention of the cecum, few enlarged mesenteric lymph nodes. Colonoscopy done on 5/20 showed partially obstructing ascending colon mass concerning for primary colon cancer. Chest CT did not show any signs of metastatic disease. General surgery did laparoscopic assisted ileocolectomy on 5/21.  Pathology showed invasive adenocarcinoma.  Patient does not want any chemotherapy or radiation. -General surgery following.  Currently tolerating diet. -Palliative care following: Outpatient follow-up with palliative care.  Anemia of chronic disease -Most likely from above.  Hemoglobin stable.  Monitor intermittently  Hypothyroidism - Continue Synthyroid   Hyperlipidemia - Continue rosuvastatin   Hypokalemia,  hypomagnesemia -Improved   Hypertension - Continue metoprolol   Coronary artery disease - No anginal symptoms.  Continue beta-blocker, statin   Grade 1 diastolic dysfunction - Follows with cardiology as an outpatient. Currently  euvolemic   Recurrent UTI  -Patient possibly has a new UTI with dysuria and increased frequency.  UA suggestive of UTI.  Follow urine culture.  Continue Rocephin.  Trimethoprim held from 10/20/2022   Physical deconditioning  - Lives at ILF.  PT recommended SNF. TOC following.   DVT prophylaxis: Lovenox Code Status: DNR Family Communication: Daughter at bedside on 10/19/2022 disposition Plan: Status is: Inpatient Remains inpatient appropriate because: Of need for SNF placement  Consultants: General surgery/palliative care Procedures: As above  Antimicrobials:  Anti-infectives (From admission, onward)    Start     Dose/Rate Route Frequency Ordered Stop   10/20/22 1015  cefTRIAXone (ROCEPHIN) 1 g in sodium chloride 0.9 % 100 mL IVPB        1 g 200 mL/hr over 30 Minutes Intravenous Every 24 hours 10/20/22 0927     10/10/22 0600  cefoTEtan (CEFOTAN) 2 g in sodium chloride 0.9 % 100 mL IVPB        2 g 200 mL/hr over 30 Minutes Intravenous On call to O.R. 10/09/22 1623 10/11/22 0700   10/07/22 1615  trimethoprim (TRIMPEX) tablet 100 mg  Status:  Discontinued        100 mg Oral Daily 10/07/22 1605 10/20/22 1145        Subjective: Patient seen and examined at bedside.  No worsening shortness of breath, fever or vomiting reported.   Objective: Vitals:   10/20/22 1022 10/20/22 1326 10/20/22 2126 10/21/22 0339  BP: 130/84 (!) 147/65 (!) 144/94 (!) 153/88  Pulse: 96 84 88 74  Resp:  18 18 18   Temp:  98 F (36.7 C) 98.2 F (36.8 C) 97.7 F (36.5 C)  TempSrc:   Oral Oral  SpO2: 100% 96% 95% 96%  Weight:      Height:        Intake/Output Summary (Last 24 hours) at 10/21/2022 0801 Last data filed at 10/20/2022 1340 Gross per 24 hour  Intake 120 ml   Output --  Net 120 ml    Filed Weights   10/07/22 1710 10/09/22 1320 10/10/22 1041  Weight: 54.4 kg 54.4 kg 54 kg    Examination:  General: On room air currently.  No distress.  Elderly female lying in bed.  Looks chronically ill and deconditioned. respiratory: Decreased breath sounds at bases bilaterally with some crackles  CVS: Rate mostly controlled; S1 and S2 heard  abdominal: Soft, mildly tender; still has some distention; no organomegaly; bowel sounds normally heard  extremities: Trace lower extremity edema present; no clubbing  Data Reviewed: I have personally reviewed following labs and imaging studies  CBC: Recent Labs  Lab 10/14/22 1547 10/15/22 0909 10/16/22 0512 10/17/22 0600 10/18/22 0642  WBC 5.4 6.1 6.2 5.8 5.6  HGB 9.3* 9.5* 9.9* 9.5* 9.3*  HCT 28.7* 30.0* 30.0* 26.6* 29.8*  MCV 90.8 87.7 90.6 99.3 91.4  PLT 286 328 318 332 345    Basic Metabolic Panel: Recent Labs  Lab 10/15/22 0909 10/16/22 0512 10/17/22 0600 10/18/22 0642  NA 135 134* 136 137  K 3.1* 2.9* 3.5 3.6  CL 104 103 104 104  CO2 22 23 26 26   GLUCOSE 122* 111* 106* 103*  BUN 12 7* 10 13  CREATININE 0.64 0.47 0.54 0.64  CALCIUM 8.1* 8.0* 8.1* 8.1*  MG 1.8 1.5* 2.1 1.8    GFR: Estimated Creatinine Clearance: 39.8 mL/min (by C-G formula based on SCr of 0.64 mg/dL). Liver Function Tests: Recent Labs  Lab 10/15/22 0909  AST 12*  ALT 10  ALKPHOS 47  BILITOT 0.5  PROT 6.0*  ALBUMIN 2.9*    No results for input(s): "LIPASE", "AMYLASE" in the last 168 hours. No results for input(s): "AMMONIA" in the last 168 hours. Coagulation Profile: No results for input(s): "INR", "PROTIME" in the last 168 hours. Cardiac Enzymes: No results for input(s): "CKTOTAL", "CKMB", "CKMBINDEX", "TROPONINI" in the last 168 hours. BNP (last 3 results) No results for input(s): "PROBNP" in the last 8760 hours. HbA1C: No results for input(s): "HGBA1C" in the last 72 hours. CBG: No results for  input(s): "GLUCAP" in the last 168 hours. Lipid Profile: No results for input(s): "CHOL", "HDL", "LDLCALC", "TRIG", "CHOLHDL", "LDLDIRECT" in the last 72 hours. Thyroid Function Tests: No results for input(s): "TSH", "T4TOTAL", "FREET4", "T3FREE", "THYROIDAB" in the last 72 hours. Anemia Panel: No results for input(s): "VITAMINB12", "FOLATE", "FERRITIN", "TIBC", "IRON", "RETICCTPCT" in the last 72 hours. Sepsis Labs: No results for input(s): "PROCALCITON", "LATICACIDVEN" in the last 168 hours.  No results found for this or any previous visit (from the past 240 hour(s)).        Radiology Studies: No results found.      Scheduled Meds:  acetaminophen  1,000 mg Oral TID   cyanocobalamin  500 mcg Oral Daily   enoxaparin (LOVENOX) injection  40 mg Subcutaneous Q24H   fluticasone  2 spray Each Nare Daily   gabapentin  100 mg Oral BID   levothyroxine  75 mcg Oral Q breakfast   lidocaine  1 patch Transdermal Q24H   loratadine  10 mg Oral Daily   metoprolol succinate  12.5 mg Oral Daily   rosuvastatin  10 mg Oral QHS   tapentadol  50 mg Oral BID   Continuous Infusions:  sodium chloride 10 mL/hr at 10/17/22 0617   cefTRIAXone (ROCEPHIN)  IV 200 mL/hr at 10/20/22 1320          Oretha Weismann, MD [facility] 10/21/2022, 8:01 AM

## 2022-10-21 NOTE — Progress Notes (Signed)
PMT no charge note Chart reviewed TOC note reviewed No new palliative medicine team specific recommendations Noted plans for skilled nursing facility placement near patient's daughter Recommend outpatient follow-up with palliative care No charge Rosalin Hawking, MD [facility]

## 2022-10-22 DIAGNOSIS — K6389 Other specified diseases of intestine: Secondary | ICD-10-CM | POA: Diagnosis not present

## 2022-10-22 LAB — URINE CULTURE

## 2022-10-22 MED ORDER — CEFADROXIL 500 MG PO CAPS
500.0000 mg | ORAL_CAPSULE | Freq: Two times a day (BID) | ORAL | Status: DC
  Filled 2022-10-22: qty 1

## 2022-10-22 NOTE — Progress Notes (Signed)
PROGRESS NOTE    Kerri [name]  ZOX:[id number] DOB: May 02, 1930 DOA: 10/07/2022 PCP: Delma Officer, PA   Brief Narrative:  89 year old female with history of colonic polyps, asthma, basal cell carcinoma of the left forehead, coronary artery disease, depression/anxiety, diverticulosis, hyperlipidemia, fatty liver disease, kidney cancer s/p ablation done in [facility] ,hypertension, hypothyroidism presented with nausea, decreased oral intake, vomiting,wight loss, black stool. CT abdomen/pelvis showed circumferential wall thickening of the ascending colon with distention of the cecum, few enlarged mesenteric lymph nodes. Colonoscopy done on 5/20 showed partially obstructing ascending colon mass concerning for primary colon cancer. Chest CT did not show any signs of metastatic disease. General surgery did laparoscopic assisted ileocolectomy on 5/21.  Pathology showed invasive adenocarcinoma.  Patient does not want any chemotherapy or radiation.  PT is recommending SNF placement.  She is currently medically stable for discharge.  Assessment & Plan:   New diagnosis of invasive adenocarcinoma of colon, T3N0 Goals of care -CT abdomen/pelvis showed circumferential wall thickening of the ascending colon with distention of the cecum, few enlarged mesenteric lymph nodes. Colonoscopy done on 5/20 showed partially obstructing ascending colon mass concerning for primary colon cancer. Chest CT did not show any signs of metastatic disease. General surgery did laparoscopic assisted ileocolectomy on 5/21.  Pathology showed invasive adenocarcinoma.  Patient does not want any chemotherapy or radiation. -General surgery following.  Currently tolerating diet. -Palliative care following: Outpatient follow-up with palliative care.  Anemia of chronic disease -Most likely from above.  Hemoglobin stable.  Monitor intermittently  Hypothyroidism - Continue Synthyroid   Hyperlipidemia - Continue rosuvastatin   Hypokalemia,  hypomagnesemia -Improved   Hypertension - Continue metoprolol   Coronary artery disease - No anginal symptoms.  Continue beta-blocker, statin   Grade 1 diastolic dysfunction - Follows with cardiology as an outpatient. Currently  euvolemic   Recurrent UTI  -Patient possibly has a new UTI with dysuria and increased frequency.  UA suggestive of UTI.  Urine culture growing Proteus: Follow sensitivities.  Continue Rocephin.  Trimethoprim held from 10/20/2022   Physical deconditioning  - Lives at ILF.  PT recommended SNF. TOC following.   DVT prophylaxis: Lovenox Code Status: DNR Family Communication: Daughter at bedside on 10/19/2022 disposition Plan: Status is: Inpatient Remains inpatient appropriate because: Of need for SNF placement.  Possible discharge tomorrow if bed available.  Consultants: General surgery/palliative care Procedures: As above  Antimicrobials:  Anti-infectives (From admission, onward)    Start     Dose/Rate Route Frequency Ordered Stop   10/20/22 1015  cefTRIAXone (ROCEPHIN) 1 g in sodium chloride 0.9 % 100 mL IVPB        1 g 200 mL/hr over 30 Minutes Intravenous Every 24 hours 10/20/22 0927     10/10/22 0600  cefoTEtan (CEFOTAN) 2 g in sodium chloride 0.9 % 100 mL IVPB        2 g 200 mL/hr over 30 Minutes Intravenous On call to O.R. 10/09/22 1623 10/11/22 0700   10/07/22 1615  trimethoprim (TRIMPEX) tablet 100 mg  Status:  Discontinued        100 mg Oral Daily 10/07/22 1605 10/20/22 1145        Subjective: Patient seen and examined at bedside.  No fever, chest pain, worsening shortness of breath reported.   Objective: Vitals:   10/21/22 0339 10/21/22 1409 10/21/22 1959 10/22/22 0644  BP: (!) 153/88 105/66 135/72 120/74  Pulse: 74 73 86 75  Resp: 18 20 16 16   Temp: 97.7 F (36.5 C) 97.8  F (36.6 C) 97.9 F (36.6 C) (!) 97.4 F (36.3 C)  TempSrc: Oral Oral Oral Oral  SpO2: 96% 100% 100% 99%  Weight:      Height:        Intake/Output Summary  (Last 24 hours) at 10/22/2022 0750 Last data filed at 10/21/2022 2347 Gross per 24 hour  Intake 750.17 ml  Output 300 ml  Net 450.17 ml    Filed Weights   10/07/22 1710 10/09/22 1320 10/10/22 1041  Weight: 54.4 kg 54.4 kg 54 kg    Examination:  General: No acute distress.  Still on room air..  Elderly female lying in bed.  Looks chronically ill and deconditioned. respiratory: Bilateral decreased breath sounds at bases with scattered crackles  CVS: S1 and S2 are heard; currently rate controlled  abdominal: Soft, mildly tender; distended slightly; no organomegaly; normal bowel sounds heard  extremities: No cyanosis; mild lower extremity edema present  Data Reviewed: I have personally reviewed following labs and imaging studies  CBC: Recent Labs  Lab 10/15/22 0909 10/16/22 0512 10/17/22 0600 10/18/22 0642  WBC 6.1 6.2 5.8 5.6  HGB 9.5* 9.9* 9.5* 9.3*  HCT 30.0* 30.0* 26.6* 29.8*  MCV 87.7 90.6 99.3 91.4  PLT 328 318 332 345    Basic Metabolic Panel: Recent Labs  Lab 10/15/22 0909 10/16/22 0512 10/17/22 0600 10/18/22 0642  NA 135 134* 136 137  K 3.1* 2.9* 3.5 3.6  CL 104 103 104 104  CO2 22 23 26 26   GLUCOSE 122* 111* 106* 103*  BUN 12 7* 10 13  CREATININE 0.64 0.47 0.54 0.64  CALCIUM 8.1* 8.0* 8.1* 8.1*  MG 1.8 1.5* 2.1 1.8    GFR: Estimated Creatinine Clearance: 39 mL/min (by C-G formula based on SCr of 0.64 mg/dL). Liver Function Tests: Recent Labs  Lab 10/15/22 0909  AST 12*  ALT 10  ALKPHOS 47  BILITOT 0.5  PROT 6.0*  ALBUMIN 2.9*    No results for input(s): "LIPASE", "AMYLASE" in the last 168 hours. No results for input(s): "AMMONIA" in the last 168 hours. Coagulation Profile: No results for input(s): "INR", "PROTIME" in the last 168 hours. Cardiac Enzymes: No results for input(s): "CKTOTAL", "CKMB", "CKMBINDEX", "TROPONINI" in the last 168 hours. BNP (last 3 results) No results for input(s): "PROBNP" in the last 8760 hours. HbA1C: No  results for input(s): "HGBA1C" in the last 72 hours. CBG: No results for input(s): "GLUCAP" in the last 168 hours. Lipid Profile: No results for input(s): "CHOL", "HDL", "LDLCALC", "TRIG", "CHOLHDL", "LDLDIRECT" in the last 72 hours. Thyroid Function Tests: No results for input(s): "TSH", "T4TOTAL", "FREET4", "T3FREE", "THYROIDAB" in the last 72 hours. Anemia Panel: No results for input(s): "VITAMINB12", "FOLATE", "FERRITIN", "TIBC", "IRON", "RETICCTPCT" in the last 72 hours. Sepsis Labs: No results for input(s): "PROCALCITON", "LATICACIDVEN" in the last 168 hours.  Recent Results (from the past 240 hour(s))  Urine Culture (for pregnant, neutropenic or urologic patients or patients with an indwelling urinary catheter)     Status: Abnormal (Preliminary result)   Collection Time: 10/20/22 10:02 AM   Specimen: Urine, Clean Catch  Result Value Ref Range Status   Specimen Description   Final    URINE, CLEAN CATCH Performed at [facility], [address] [place], [place] [address]    Special Requests   Final    NONE Performed at [facility] [address] [place], [place] [address]    Culture (A)  Final    >=100,000 COLONIES/mL PROTEUS  MIRABILIS SUSCEPTIBILITIES TO FOLLOW Performed at [facility], [address], [place], [place] [address]    Report Status PENDING  Incomplete          Radiology Studies: No results found.      Scheduled Meds:  acetaminophen  1,000 mg Oral TID   cyanocobalamin  500 mcg Oral Daily   enoxaparin (LOVENOX) injection  40 mg Subcutaneous Q24H   fluticasone  2 spray Each Nare Daily   gabapentin  100 mg Oral BID   levothyroxine  75 mcg Oral Q breakfast   lidocaine  1 patch Transdermal Q24H   loratadine  10 mg Oral Daily   metoprolol succinate  12.5 mg Oral Daily   rosuvastatin  10 mg Oral QHS   tapentadol  50 mg Oral BID   Continuous Infusions:  sodium chloride Stopped (10/21/22 1900)    cefTRIAXone (ROCEPHIN)  IV Stopped (10/21/22 3710)          Glade Lloyd, MD [facility] 10/22/2022, 7:50 AM

## 2022-10-23 DIAGNOSIS — K6389 Other specified diseases of intestine: Secondary | ICD-10-CM | POA: Diagnosis not present

## 2022-10-23 LAB — CBC WITH DIFFERENTIAL/PLATELET
Abs Immature Granulocytes: 0.15 10*3/uL — ABNORMAL HIGH (ref 0.00–0.07)
Basophils Absolute: 0.1 10*3/uL (ref 0.0–0.1)
Basophils Relative: 1 %
Eosinophils Absolute: 0.2 10*3/uL (ref 0.0–0.5)
Eosinophils Relative: 2 %
HCT: 30.3 % — ABNORMAL LOW (ref 36.0–46.0)
Hemoglobin: 10 g/dL — ABNORMAL LOW (ref 12.0–15.0)
Immature Granulocytes: 2 %
Lymphocytes Relative: 23 %
Lymphs Abs: 1.8 10*3/uL (ref 0.7–4.0)
MCH: 31.4 pg (ref 26.0–34.0)
MCHC: 33 g/dL (ref 30.0–36.0)
MCV: 95.3 fL (ref 80.0–100.0)
Monocytes Absolute: 0.5 10*3/uL (ref 0.1–1.0)
Monocytes Relative: 6 %
Neutro Abs: 4.9 10*3/uL (ref 1.7–7.7)
Neutrophils Relative %: 66 %
Platelets: 409 10*3/uL — ABNORMAL HIGH (ref 150–400)
RBC: 3.18 MIL/uL — ABNORMAL LOW (ref 3.87–5.11)
RDW: 23.6 % — ABNORMAL HIGH (ref 11.5–15.5)
WBC: 7.5 10*3/uL (ref 4.0–10.5)
nRBC: 0 % (ref 0.0–0.2)

## 2022-10-23 LAB — BASIC METABOLIC PANEL
Anion gap: 9 (ref 5–15)
BUN: 18 mg/dL (ref 8–23)
CO2: 26 mmol/L (ref 22–32)
Calcium: 8.6 mg/dL — ABNORMAL LOW (ref 8.9–10.3)
Chloride: 105 mmol/L (ref 98–111)
Creatinine, Ser: 0.7 mg/dL (ref 0.44–1.00)
GFR, Estimated: >60 mL/min (ref >60)
Glucose, Bld: 136 mg/dL — ABNORMAL HIGH (ref 70–99)
Potassium: 3.6 mmol/L (ref 3.5–5.1)
Sodium: 140 mmol/L (ref 135–145)

## 2022-10-23 LAB — MAGNESIUM: Magnesium: 2 mg/dL (ref 1.7–2.4)

## 2022-10-23 MED ORDER — CEPHALEXIN 500 MG PO CAPS
500.0000 mg | ORAL_CAPSULE | Freq: Three times a day (TID) | ORAL | Status: DC
  Administered 2022-10-23 – 2022-10-25 (×5): 500 mg via ORAL
  Filled 2022-10-23 (×5): qty 1

## 2022-10-23 MED ORDER — METHOCARBAMOL 500 MG PO TABS: 500.0000 mg | ORAL_TABLET | Freq: Three times a day (TID) | ORAL | Status: DC | PRN

## 2022-10-23 MED ORDER — ACETAMINOPHEN 500 MG PO TABS: 1000.0000 mg | ORAL_TABLET | Freq: Four times a day (QID) | ORAL | 0 refills | Status: AC | PRN

## 2022-10-23 NOTE — Progress Notes (Signed)
    13 Days Post-Op  Subjective: No new complaints.  Tolerating her diet and moving her bowels.  States she is being placed tomorrow.  ROS: See above, otherwise other systems negative  Objective: Vital signs in last 24 hours: Temp:  [97.4 F (36.3 C)-97.7 F (36.5 C)] 97.4 F (36.3 C) (06/03 0703) Pulse Rate:  [71-80] 76 (06/03 0703) Resp:  [20] 20 (06/02 1349) BP: (115-130)/(59-82) 130/82 (06/03 0703) SpO2:  [98 %-100 %] 100 % (06/03 0703) Last BM Date : 10/21/22  Intake/Output from previous day: 06/02 0701 - 06/03 0700 In: 360 [P.O.:360] Out: -  Intake/Output this shift: No intake/output data recorded.  PE: Abd: soft, appropriately tender, incision c/d/I with staples intact, ND  Lab Results:  No results for input(s): "WBC", "HGB", "HCT", "PLT" in the last 72 hours. BMET No results for input(s): "NA", "K", "CL", "CO2", "GLUCOSE", "BUN", "CREATININE", "CALCIUM" in the last 72 hours. PT/INR No results for input(s): "LABPROT", "INR" in the last 72 hours. CMP     Component Value Date/Time   NA 137 10/18/2022 0642   NA 138 02/11/2019 1413   K 3.6 10/18/2022 0642   CL 104 10/18/2022 0642   CO2 26 10/18/2022 0642   GLUCOSE 103 (H) 10/18/2022 0642   BUN 13 10/18/2022 0642   BUN 14 02/11/2019 1413   CREATININE 0.64 10/18/2022 0642   CALCIUM 8.1 (L) 10/18/2022 0642   PROT 6.0 (L) 10/15/2022 0909   ALBUMIN 2.9 (L) 10/15/2022 0909   AST 12 (L) 10/15/2022 0909   ALT 10 10/15/2022 0909   ALKPHOS 47 10/15/2022 0909   BILITOT 0.5 10/15/2022 0909   GFRNONAA >60 10/18/2022 0642   GFRAA >60 10/13/2019 2238   Lipase     Component Value Date/Time   LIPASE 26 10/25/2017 1815       Studies/Results: No results found.  Anti-infectives: Anti-infectives (From admission, onward)    Start     Dose/Rate Route Frequency Ordered Stop   10/23/22 1000  cefadroxil (DURICEF) capsule 500 mg        500 mg Oral 2 times daily 10/22/22 1405 10/31/22 0959   10/20/22 1015   cefTRIAXone (ROCEPHIN) 1 g in sodium chloride 0.9 % 100 mL IVPB  Status:  Discontinued        1 g 200 mL/hr over 30 Minutes Intravenous Every 24 hours 10/20/22 0927 10/22/22 1402   10/10/22 0600  cefoTEtan (CEFOTAN) 2 g in sodium chloride 0.9 % 100 mL IVPB        2 g 200 mL/hr over 30 Minutes Intravenous On call to O.R. 10/09/22 1623 10/11/22 0700   10/07/22 1615  trimethoprim (TRIMPEX) tablet 100 mg  Status:  Discontinued        100 mg Oral Daily 10/07/22 1605 10/20/22 1145        Assessment/Plan POD 13, s/p lap assisted ileocecectomy, Dr. Magnus Ivan 10/10/22 for invasive adenocarcinoma T3, N0 -surgically stable for DC to SNF when bed available -Dc staples today -patient will need to find a surgeon in [place] to follow up with as she says she will not be returning here for follow up.  FEN - regular VTE - lovenox ID - none currently needed    LOS: 15 days    Letha Cape , [facility] 10/23/2022, 8:04 AM Please see Amion for pager number during day hours 7:00am-4:30pm or 7:00am -11:30am on weekends

## 2022-10-23 NOTE — Progress Notes (Signed)
Pt refuses am labs, phlebotomy asked to return at 9am.  Will continue to monitor

## 2022-10-23 NOTE — Progress Notes (Signed)
Mobility Specialist - Progress Note   10/23/22 1525  Mobility  Activity Ambulated with assistance in hallway  Level of Assistance Standby assist, set-up cues, supervision of patient - no hands on  Assistive Device Front wheel walker  Distance Ambulated (ft) 100 ft  Activity Response Tolerated well  Mobility Referral Yes  $Mobility charge 1 Mobility  Mobility Specialist Start Time (ACUTE ONLY) 0300  Mobility Specialist Stop Time (ACUTE ONLY) 0327  Mobility Specialist Time Calculation (min) (ACUTE ONLY) 27 min   Pt received in bed and agreeable to mobility. No complaints during session. Pt to bed after session with all needs met.     [facility]

## 2022-10-23 NOTE — Progress Notes (Signed)
Physical Therapy Treatment Patient Details Name: Kerri [name] MRN: [id number] DOB: 10/28/1929 Today's Date: 10/23/2022   History of Present Illness Pt is 89 yo female on 10/07/22 with colonic neoplasm s/p laparoscopic assisted ileocolectomy on 10/10/22.  Pt with hx including colonic polyps, asthma, basal cell carcinoma of the left forehead, coronary artery disease, depression/anxiety, diverticulosis, hyperlipidemia, fatty liver disease, kidney cancer s/p ablation done in [facility] ,hypertension, hypothyroidism    PT Comments    Agreeable to PT at this time. Pleasant to work with this pm. Pt progressing toward goals. Much improved tolerance to activity, amb incr distance with PT in addition to 2 other walks with nursing and mobility team. Pt overall supervision to mod I for session today. Pt is motivated to d/c home, appears she is returning to baseline for functional mobility.    Recommendations for follow up therapy are one component of a multi-disciplinary discharge planning process, led by the attending physician.  Recommendations may be updated based on patient status, additional functional criteria and insurance authorization.  Follow Up Recommendations  Can patient physically be transported by private vehicle: Yes    Assistance Recommended at Discharge Intermittent Supervision/Assistance  Patient can return home with the following A little help with walking and/or transfers;A little help with bathing/dressing/bathroom;Assistance with cooking/housework;Assist for transportation   Equipment Recommendations  None recommended by PT    Recommendations for Other Services       Precautions / Restrictions Precautions Precautions: Fall Restrictions Weight Bearing Restrictions: No     Mobility  Bed Mobility Overal bed mobility: Modified Independent Bed Mobility: Supine to Sit, Sit to Supine     Supine to sit: Modified independent (Device/Increase time) Sit to supine: Modified independent  (Device/Increase time)   General bed mobility comments: HOB at 20 degrees, no physical assist, incr time    Transfers Overall transfer level: Needs assistance Equipment used: Rolling walker (2 wheels) Transfers: Sit to/from Stand Sit to Stand: Supervision           General transfer comment: subtle cue for hand placement, no physical assist, incr time needed    Ambulation/Gait Ambulation/Gait assistance: Supervision, Min guard Gait Distance (Feet): 170 Feet Assistive device: Rolling walker (2 wheels) Gait Pattern/deviations: Step-through pattern, Decreased stride length, Trunk flexed       General Gait Details: occasional cues for posture and RW position, no overt LOB. distance to tolerance   Stairs             Wheelchair Mobility    Modified Rankin (Stroke Patients Only)       Balance     Sitting balance-Leahy Scale: Good     Standing balance support: No upper extremity supported Standing balance-Leahy Scale: Fair Standing balance comment: fair static without UE support; reliant on device for gait                            Cognition Arousal/Alertness: Awake/alert Behavior During Therapy: WFL for tasks assessed/performed Overall Cognitive Status: Within Functional Limits for tasks assessed                                 General Comments: verbalizes some memory deficits, requires incr time to recall thoughts/events at times; overall appropriate        Exercises      General Comments        Pertinent Vitals/Pain Pain Assessment Pain Assessment: No/denies pain  Home Living                          Prior Function            PT Goals (current goals can now be found in the care plan section) Acute Rehab PT Goals Patient Stated Goal: To die comfortably PT Goal Formulation: With patient Time For Goal Achievement: 10/25/22 Potential to Achieve Goals: Good Progress towards PT goals: Progressing toward  goals    Frequency    Min 1X/week      PT Plan Current plan remains appropriate    Co-evaluation              AM-PAC PT "6 Clicks" Mobility   Outcome Measure  Help needed turning from your back to your side while in a flat bed without using bedrails?: None Help needed moving from lying on your back to sitting on the side of a flat bed without using bedrails?: None Help needed moving to and from a bed to a chair (including a wheelchair)?: None Help needed standing up from a chair using your arms (e.g., wheelchair or bedside chair)?: None Help needed to walk in hospital room?: A Little Help needed climbing 3-5 steps with a railing? : A Little 6 Click Score: 22    End of Session Equipment Utilized During Treatment: Gait belt Activity Tolerance: Patient tolerated treatment well Patient left: in bed;with call bell/phone within reach;with bed alarm set   PT Visit Diagnosis: Other abnormalities of gait and mobility (R26.89);Muscle weakness (generalized) (M62.81)     Time: 1610-9604 PT Time Calculation (min) (ACUTE ONLY): 23 min  Charges:  $Gait Training: 23-37 mins                     Rosea Dory, PT  Acute Rehab Dept [facility]) [phone]  10/23/2022    [facility] 10/23/2022, 4:56 PM

## 2022-10-23 NOTE — Discharge Instructions (Signed)
[facility]      [facility] [phone]  OPEN ABDOMINAL SURGERY: POST OP INSTRUCTIONS  Always review your discharge instruction sheet given to you by the facility where your surgery was performed.  IF YOU HAVE DISABILITY OR FAMILY LEAVE FORMS, YOU MUST BRING THEM TO THE OFFICE FOR PROCESSING.  PLEASE DO NOT GIVE THEM TO YOUR DOCTOR.  A prescription for pain medication may be given to you upon discharge.  Take your pain medication as prescribed, if needed.  If narcotic pain medicine is not needed, then you may take acetaminophen (Tylenol) or ibuprofen (Advil) as needed. Take your usually prescribed medications unless otherwise directed. If you need a refill on your pain medication, please contact your pharmacy. They will contact our office to request authorization.  Prescriptions will not be filled after 5pm or on week-ends. You should follow a light diet the first few days after arrival home, such as soup and crackers, pudding, etc.unless your doctor has advised otherwise. A high-fiber, low fat diet can be resumed as tolerated.   Be sure to include lots of fluids daily. Most patients will experience some swelling and bruising on the chest and neck area.  Ice packs will help.  Swelling and bruising can take several days to resolve Most patients will experience some swelling and bruising in the area of the incision. Ice pack will help. Swelling and bruising can take several days to resolve..  It is common to experience some constipation if taking pain medication after surgery.  Increasing fluid intake and taking a stool softener will usually help or prevent this problem from occurring.  A mild laxative (Milk of Magnesia or Miralax) should be taken according to package directions if there are no bowel movements after 48 hours.  You may have steri-strips (small skin tapes) in place directly over the incision.  These strips should be left on the skin for 7-10 days.  If your surgeon used skin  glue on the incision, you may shower in 24 hours.  The glue will flake off over the next 2-3 weeks.  Any sutures or staples will be removed at the office during your follow-up visit. You may find that a light gauze bandage over your incision may keep your staples from being rubbed or pulled. You may shower and replace the bandage daily. ACTIVITIES:  You may resume regular (light) daily activities beginning the next day--such as daily self-care, walking, climbing stairs--gradually increasing activities as tolerated.  You may have sexual intercourse when it is comfortable.  Refrain from any heavy lifting or straining until approved by your doctor. You may drive when you no longer are taking prescription pain medication, you can comfortably wear a seatbelt, and you can safely maneuver your car and apply brakes Return to Work: ___________________________________ You should see your doctor in the office for a follow-up appointment approximately two weeks after your surgery.  Make sure that you call for this appointment within a day or two after you arrive home to insure a convenient appointment time. OTHER INSTRUCTIONS:  _____________________________________________________________ _____________________________________________________________  WHEN TO CALL YOUR DOCTOR: Fever over 101.0 Inability to urinate Nausea and/or vomiting Extreme swelling or bruising Continued bleeding from incision. Increased pain, redness, or drainage from the incision. Difficulty swallowing or breathing Muscle cramping or spasms. Numbness or tingling in hands or feet or around lips.  The clinic staff is available to answer your questions during regular business hours.  Please don't hesitate to call and ask to speak to one of   the nurses if you have concerns.  For further questions, please visit [email or web address]  

## 2022-10-23 NOTE — Progress Notes (Addendum)
PROGRESS NOTE    Kerri [name]  ZOX:[id number] DOB: [birth year] DOA: 10/07/2022 PCP: Delma Officer, PA   Brief Narrative:  89 year old female with history of colonic polyps, asthma, basal cell carcinoma of the left forehead, coronary artery disease, depression/anxiety, diverticulosis, hyperlipidemia, fatty liver disease, kidney cancer s/p ablation done in [facility] ,hypertension, hypothyroidism presented with nausea, decreased oral intake, vomiting,wight loss, black stool. CT abdomen/pelvis showed circumferential wall thickening of the ascending colon with distention of the cecum, few enlarged mesenteric lymph nodes. Colonoscopy done on 5/20 showed partially obstructing ascending colon mass concerning for primary colon cancer. Chest CT did not show any signs of metastatic disease. General surgery did laparoscopic assisted ileocolectomy on 5/21.  Pathology showed invasive adenocarcinoma.  Patient does not want any chemotherapy or radiation.  PT is recommending SNF placement.  She is currently medically stable for discharge.  Assessment & Plan:   New diagnosis of invasive adenocarcinoma of colon, T3N0 Goals of care -CT abdomen/pelvis showed circumferential wall thickening of the ascending colon with distention of the cecum, few enlarged mesenteric lymph nodes. Colonoscopy done on 5/20 showed partially obstructing ascending colon mass concerning for primary colon cancer. Chest CT did not show any signs of metastatic disease. General surgery did laparoscopic assisted ileocolectomy on 5/21.  Pathology showed invasive adenocarcinoma.  Patient does not want any chemotherapy or radiation. -General surgery following.  Currently tolerating diet. -Palliative care following: Outpatient follow-up with palliative care.  Anemia of chronic disease -Most likely from above.  Hemoglobin stable.  Monitor intermittently  Hypothyroidism - Continue Synthyroid   Hyperlipidemia - Continue rosuvastatin   Hypokalemia,  hypomagnesemia -Improved   Hypertension - Continue metoprolol   Coronary artery disease - No anginal symptoms.  Continue beta-blocker, statin   Grade 1 diastolic dysfunction - Follows with cardiology as an outpatient. Currently  euvolemic   Recurrent UTI  -Patient possibly has a new UTI with dysuria and increased frequency.  UA suggestive of UTI.  Urine culture growing Proteus. Rocephin switched to cefadroxil on 10/22/2022.  Trimethoprim held from 10/20/2022   Physical deconditioning  - Lives at ILF.  PT recommended SNF. TOC following.   DVT prophylaxis: Lovenox Code Status: DNR Family Communication: Daughter on phone on 10/23/2022 disposition Plan: Status is: Inpatient Remains inpatient appropriate because: Of need for SNF placement.  Possible discharge tomorrow if bed available.  Consultants: General surgery/palliative care Procedures: As above  Antimicrobials:  Anti-infectives (From admission, onward)    Start     Dose/Rate Route Frequency Ordered Stop   10/23/22 1000  cefadroxil (DURICEF) capsule 500 mg        500 mg Oral 2 times daily 10/22/22 1405 10/31/22 0959   10/20/22 1015  cefTRIAXone (ROCEPHIN) 1 g in sodium chloride 0.9 % 100 mL IVPB  Status:  Discontinued        1 g 200 mL/hr over 30 Minutes Intravenous Every 24 hours 10/20/22 0927 10/22/22 1402   10/10/22 0600  cefoTEtan (CEFOTAN) 2 g in sodium chloride 0.9 % 100 mL IVPB        2 g 200 mL/hr over 30 Minutes Intravenous On call to O.R. 10/09/22 1623 10/11/22 0700   10/07/22 1615  trimethoprim (TRIMPEX) tablet 100 mg  Status:  Discontinued        100 mg Oral Daily 10/07/22 1605 10/20/22 1145        Subjective: Patient seen and examined at bedside. Denies chest pain, fever, vomiting or shortness of breath. Objective: Vitals:   10/22/22 0644 10/22/22 1349 10/22/22  2102 10/23/22 0703  BP: 120/74 115/77 (!) 130/59 130/82  Pulse: 75 71 80 76  Resp: 16 20    Temp: (!) 97.4 F (36.3 C) 97.7 F (36.5 C) 97.7 F  (36.5 C) (!) 97.4 F (36.3 C)  TempSrc: Oral Oral Oral Oral  SpO2: 99% 100% 98% 100%  Weight:      Height:        Intake/Output Summary (Last 24 hours) at 10/23/2022 0911 Last data filed at 10/22/2022 1900 Gross per 24 hour  Intake 240 ml  Output --  Net 240 ml    Filed Weights   10/07/22 1710 10/09/22 1320 10/10/22 1041  Weight: 54.4 kg 54.4 kg 54 kg    Examination:  General: On room air currently.  No distress.  Elderly female lying in bed.  Looks chronically ill and deconditioned. respiratory: Decreased breath sounds at bases bilaterally with some crackles CVS: Rate mostly controlled; S1 and S2 are heard  abdominal: Soft, mildly tender; mildly distended; no organomegaly; bowel sounds heard normally  extremities: Trace lower extremity edema present; no clubbing  Data Reviewed: I have personally reviewed following labs and imaging studies  CBC: Recent Labs  Lab 10/17/22 0600 10/18/22 0642 10/23/22 0818  WBC 5.8 5.6 7.5  NEUTROABS  --   --  4.9  HGB 9.5* 9.3* 10.0*  HCT 26.6* 29.8* 30.3*  MCV 99.3 91.4 95.3  PLT 332 345 409*    Basic Metabolic Panel: Recent Labs  Lab 10/17/22 0600 10/18/22 0642 10/23/22 0818  NA 136 137 140  K 3.5 3.6 3.6  CL 104 104 105  CO2 26 26 26   GLUCOSE 106* 103* 136*  BUN 10 13 18   CREATININE 0.54 0.64 0.70  CALCIUM 8.1* 8.1* 8.6*  MG 2.1 1.8 2.0    GFR: Estimated Creatinine Clearance: 39 mL/min (by C-G formula based on SCr of 0.7 mg/dL). Liver Function Tests: No results for input(s): "AST", "ALT", "ALKPHOS", "BILITOT", "PROT", "ALBUMIN" in the last 168 hours.  No results for input(s): "LIPASE", "AMYLASE" in the last 168 hours. No results for input(s): "AMMONIA" in the last 168 hours. Coagulation Profile: No results for input(s): "INR", "PROTIME" in the last 168 hours. Cardiac Enzymes: No results for input(s): "CKTOTAL", "CKMB", "CKMBINDEX", "TROPONINI" in the last 168 hours. BNP (last 3 results) No results for input(s):  "PROBNP" in the last 8760 hours. HbA1C: No results for input(s): "HGBA1C" in the last 72 hours. CBG: No results for input(s): "GLUCAP" in the last 168 hours. Lipid Profile: No results for input(s): "CHOL", "HDL", "LDLCALC", "TRIG", "CHOLHDL", "LDLDIRECT" in the last 72 hours. Thyroid Function Tests: No results for input(s): "TSH", "T4TOTAL", "FREET4", "T3FREE", "THYROIDAB" in the last 72 hours. Anemia Panel: No results for input(s): "VITAMINB12", "FOLATE", "FERRITIN", "TIBC", "IRON", "RETICCTPCT" in the last 72 hours. Sepsis Labs: No results for input(s): "PROCALCITON", "LATICACIDVEN" in the last 168 hours.  Recent Results (from the past 240 hour(s))  Urine Culture (for pregnant, neutropenic or urologic patients or patients with an indwelling urinary catheter)     Status: Abnormal   Collection Time: 10/20/22 10:02 AM   Specimen: Urine, Clean Catch  Result Value Ref Range Status   Specimen Description   Final    URINE, CLEAN CATCH Performed at [facility], [address] [place], [place] [address]    Special Requests   Final    NONE Performed at [facility], [address] [place], [place] [address]    Culture >=100,000 COLONIES/mL PROTEUS MIRABILIS (  A)  Final   Report Status 10/22/2022 FINAL  Final   Organism ID, Bacteria PROTEUS MIRABILIS (A)  Final      Susceptibility   Proteus mirabilis - MIC*    AMPICILLIN <=2 SENSITIVE Sensitive     CEFAZOLIN <=4 SENSITIVE Sensitive     CEFEPIME <=0.12 SENSITIVE Sensitive     CEFTRIAXONE <=0.25 SENSITIVE Sensitive     CIPROFLOXACIN <=0.25 SENSITIVE Sensitive     GENTAMICIN <=1 SENSITIVE Sensitive     IMIPENEM 2 SENSITIVE Sensitive     NITROFURANTOIN 256 RESISTANT Resistant     TRIMETH/SULFA >=320 RESISTANT Resistant     AMPICILLIN/SULBACTAM <=2 SENSITIVE Sensitive     PIP/TAZO <=4 SENSITIVE Sensitive     * >=100,000 COLONIES/mL PROTEUS MIRABILIS          Radiology Studies: No results  found.      Scheduled Meds:  acetaminophen  1,000 mg Oral TID   cefadroxil  500 mg Oral BID   cyanocobalamin  500 mcg Oral Daily   enoxaparin (LOVENOX) injection  40 mg Subcutaneous Q24H   fluticasone  2 spray Each Nare Daily   gabapentin  100 mg Oral BID   levothyroxine  75 mcg Oral Q breakfast   lidocaine  1 patch Transdermal Q24H   loratadine  10 mg Oral Daily   metoprolol succinate  12.5 mg Oral Daily   rosuvastatin  10 mg Oral QHS   tapentadol  50 mg Oral BID   Continuous Infusions:  sodium chloride Stopped (10/21/22 1900)          Glade Lloyd, MD [facility] 10/23/2022, 9:11 AM

## 2022-10-23 NOTE — Progress Notes (Signed)
PT Cancellation Note  Patient Details Name: Kerri [name] MRN: [id number] DOB: 03-22-1930   Cancelled Treatment:    Reason Eval/Treat Not Completed: Patient declined, no reason specified; "not now, it is not 3:00, I walked before and I am not doing anything"   [facility] 10/23/2022, 2:28 PM

## 2022-10-23 NOTE — TOC Progression Note (Addendum)
Transition of Care [facility]) - Progression Note    Patient Details  Name: Kerri [name] MRN: [id number] Date of Birth: [birth year]  [facility] CM/SW Contact  Beckie Busing, RN Phone Number:[phone]  10/23/2022, 8:35 AM  Clinical Narrative:    Daughter [name] called to make CM aware that daughter has already  scheduled transportation for Tuesday. Daughter also questions if patient will be discharged. CM has explained to daughter that MD will determine if patient will be discharged and that CM can not make that determination. CM also made daughter aware that CM will check on insurance auth approval.   [phone] Per [employer] portal insurance Berkley Harvey is approved but there is a notation for hold and no auth ID. CM has sent message via portal and will follow up.   1528 CM has spoken to representative at [facility]. Berkley Harvey has been approved. The hold noted on the auth in navi will be removed once the patient admits to the new facility. Patient is approved from 5/31-6/4. There will be a 24 hour grace period which means that patient will need to be admitted prior to  midnight on Wednesday 6/5.    Expected Discharge Plan: Home/Self Care Barriers to Discharge: No SNF bed (To initiate SNF workup)  Expected Discharge Plan and Services       Living arrangements for the past 2 months: Independent Living Facility [facility])                                       Social Determinants of Health (SDOH) Interventions SDOH Screenings   Food Insecurity: No Food Insecurity (10/08/2022)  Housing: Low Risk  (10/08/2022)  Transportation Needs: No Transportation Needs (10/08/2022)  Utilities: Not At Risk (10/08/2022)  Tobacco Use: Medium Risk (10/11/2022)    Readmission Risk Interventions    10/13/2022   10:13 AM 09/11/2021    2:25 PM  Readmission Risk Prevention Plan  Transportation Screening Complete Complete  PCP or Specialist Appt within 5-7 Days Complete   PCP or Specialist Appt within 3-5 Days   Complete  Home Care Screening Complete   Medication Review (RN CM) Complete   HRI or Home Care Consult  Complete  Social Work Consult for Recovery Care Planning/Counseling  Complete  Palliative Care Screening  Complete

## 2022-10-24 DIAGNOSIS — K6389 Other specified diseases of intestine: Secondary | ICD-10-CM | POA: Diagnosis not present

## 2022-10-24 MED ORDER — CEPHALEXIN 500 MG PO CAPS: 500.0000 mg | ORAL_CAPSULE | Freq: Three times a day (TID) | ORAL | 0 refills | Status: AC

## 2022-10-24 MED ORDER — LIDOCAINE 5 % EX PTCH: 1.0000 | MEDICATED_PATCH | CUTANEOUS | Status: AC

## 2022-10-24 MED ORDER — METHOCARBAMOL 500 MG PO TABS: 500.0000 mg | ORAL_TABLET | Freq: Three times a day (TID) | ORAL | 0 refills | Status: AC | PRN

## 2022-10-24 NOTE — Progress Notes (Signed)
Appears to have a bed and auth for SNF today.  Patient is surgically stable and cleared for DC.  DC instructions, etc in AVS.  Letha Cape 8:33 AM 10/24/2022

## 2022-10-24 NOTE — Discharge Summary (Addendum)
Physician Discharge Summary  Kerri [name] ZOX:[id number] DOB: 1929-06-06 DOA: 10/07/2022  PCP: Delma Officer, PA  Admit date: 10/07/2022 Discharge date: 10/25/2022  Admitted From: SNF Disposition: SNF  Recommendations for Outpatient Follow-up:  Follow up SNF provider at earliest convenience Outpatient follow-up with general surgery.  Wound care/discharge pain management as per general surgery recommendations Follow up in ED if symptoms worsen or new appear   Home Health: No Equipment/Devices: None  Discharge Condition: Guarded CODE STATUS: DNR Diet recommendation: Diet as per general surgery recommendations  Brief/Interim Summary: 89 year old female with history of colonic polyps, asthma, basal cell carcinoma of the left forehead, coronary artery disease, depression/anxiety, diverticulosis, hyperlipidemia, fatty liver disease, kidney cancer s/p ablation done in [facility] ,hypertension, hypothyroidism presented with nausea, decreased oral intake, vomiting,wight loss, black stool. CT abdomen/pelvis showed circumferential wall thickening of the ascending colon with distention of the cecum, few enlarged mesenteric lymph nodes. Colonoscopy done on 5/20 showed partially obstructing ascending colon mass concerning for primary colon cancer. Chest CT did not show any signs of metastatic disease. General surgery did laparoscopic assisted ileocolectomy on 5/21. Pathology showed invasive adenocarcinoma. Patient does not want any chemotherapy or radiation. PT is recommending SNF placement. She is currently medically stable for discharge.  She will be discharged on 10/25/2022 to SNF if she remains stable.  Discharge Diagnoses:   New diagnosis of invasive adenocarcinoma of colon, T3N0 Goals of care -CT abdomen/pelvis showed circumferential wall thickening of the ascending colon with distention of the cecum, few enlarged mesenteric lymph nodes. Colonoscopy done on 5/20 showed partially obstructing ascending  colon mass concerning for primary colon cancer. Chest CT did not show any signs of metastatic disease. General surgery did laparoscopic assisted ileocolectomy on 5/21.  Pathology showed invasive adenocarcinoma.  Patient does not want any chemotherapy or radiation. -General surgery following: Staples removed on 10/22/2022.  Currently tolerating diet.  Outpatient follow-up with general surgery.  Discharge wound care/pain management/diet as per general surgery recommendations. -Palliative care following: Outpatient follow-up with palliative care.   Anemia of chronic disease -Most likely from above.  Hemoglobin stable.  Monitor intermittently   Hypothyroidism - Continue Synthyroid   Hyperlipidemia - Continue rosuvastatin   Hypokalemia, hypomagnesemia -Improved   Hypertension - Continue metoprolol   Coronary artery disease - No anginal symptoms.  Continue beta-blocker, statin   Grade 1 diastolic dysfunction - Follows with cardiology as an outpatient. Currently  euvolemic   Recurrent UTI  -Patient possibly has a new UTI with dysuria and increased frequency.  UA suggestive of UTI.  Urine culture growing Proteus.  Rocephin switched to Keflex from 10/23/2022 onwards.  Finish total 10-day course of therapy. -trimethoprim held from 10/20/2022   Physical deconditioning  - Lives at [facility].  PT recommended SNF. TOC following. -Possible discharge tomorrow if patient remains stable.  As per the daughter on phone on 10/23/2022, transportation has been arranged for 10/25/2022 and patient needs to be discharged before 9:30 AM.    Discharge Instructions   Allergies as of 10/24/2022       Reactions   Buprenorphine Hcl Itching, Other (See Comments)   Happened awhile back- patient noted that any narcotic-based medication makes her have a "bad reaction"   Codeine Nausea And Vomiting, Other (See Comments)   Patient gets sick to her stomach Other reaction(s): nausea   Morphine Itching, Other (See Comments)    Happened awhile back- patient noted that any narcotic-based medication makes her have a "bad reaction" Other reaction(s): itching   Tramadol Nausea And  Vomiting, Other (See Comments)   Confusion and severe headaches, also   Adhesive [tape] Other (See Comments)   Tears the skin   Doxycycline Hyclate Other (See Comments)   Makes the patient "ill"   Hydrocodone-acetaminophen Nausea And Vomiting   Other reaction(s): Other   Other Other (See Comments)   Other reaction(s): headache, dizzy and nausea Other reaction(s): Other   Oxycodone Hcl    Other reaction(s): Unknown   Tapentadol Other (See Comments)   Dizziness with doses higher than 50 mg twice a day Other reaction(s): Other Dizziness with doses higher than 50 mg twice a day   Lactose Intolerance (gi) Nausea Only, Other (See Comments)   GI upset   Oxycodone Nausea And Vomiting   Other reaction(s): nausea, vomiting        Medication List     STOP taking these medications    calcium carbonate 1250 (500 Ca) MG chewable tablet Commonly known as: OS-CAL   clobetasol 0.05 % topical foam Commonly known as: OLUX   collagenase 250 UNIT/GM ointment Commonly known as: SANTYL   Cordran 4 MCG/SQCM Tape Generic drug: Flurandrenolide   folic acid 1 MG tablet Commonly known as: FOLVITE   trimethoprim 100 MG tablet Commonly known as: TRIMPEX       TAKE these medications    acetaminophen 500 MG tablet Commonly known as: TYLENOL Take 2 tablets (1,000 mg total) by mouth every 6 (six) hours as needed.   alendronate 70 MG tablet Commonly known as: FOSAMAX Take 70 mg by mouth every Sunday.   Biotin 5 MG Caps Take 5 mg by mouth daily.   cephALEXin 500 MG capsule Commonly known as: KEFLEX Take 1 capsule (500 mg total) by mouth every 8 (eight) hours for 6 days. Start taking on: October 25, 2022   cyanocobalamin 500 MCG tablet Commonly known as: VITAMIN B12 Take 500 mcg by mouth daily.   ferrous sulfate 325 (65 FE) MG  tablet Take 1 tablet (325 mg total) by mouth daily with breakfast.   fluticasone 50 MCG/ACT nasal spray Commonly known as: FLONASE Place 2 sprays into both nostrils every morning.   gabapentin 100 MG capsule Commonly known as: NEURONTIN Take 100 mg by mouth 2 (two) times daily.   levothyroxine 75 MCG tablet Commonly known as: SYNTHROID Take 75 mcg by mouth daily with breakfast.   lidocaine 5 % Commonly known as: LIDODERM Place 1 patch onto the skin daily. Remove & Discard patch within 12 hours or as directed by MD   loratadine 10 MG tablet Commonly known as: CLARITIN Take 10 mg by mouth daily.   methocarbamol 500 MG tablet Commonly known as: ROBAXIN Take 1 tablet (500 mg total) by mouth every 8 (eight) hours as needed for muscle spasms (pain).   metoprolol succinate 25 MG 24 hr tablet Commonly known as: TOPROL-XL Take 0.5 tablets (12.5 mg total) by mouth daily.   Nucynta 50 MG tablet Generic drug: tapentadol Take 50 mg by mouth 2 (two) times daily.   rosuvastatin 10 MG tablet Commonly known as: CRESTOR Take 10 mg by mouth at bedtime.   Vitamin D3 50 MCG (2000 UT) Tabs Take 2,000 Units by mouth daily.          Follow-up Information     Abigail Miyamoto, MD Follow up in 3 week(s).   Specialty: General Surgery Why: call if you would like to return here for follow up.  otherwise you will need to obtain a surgeon in [place] to follow  up with Contact information: [address] [place] [address] [phone]                Allergies  Allergen Reactions   Buprenorphine Hcl Itching and Other (See Comments)    Happened awhile back- patient noted that any narcotic-based medication makes her have a "bad reaction"   Codeine Nausea And Vomiting and Other (See Comments)    Patient gets sick to her stomach Other reaction(s): nausea   Morphine Itching and Other (See Comments)    Happened awhile back- patient noted that any narcotic-based  medication makes her have a "bad reaction" Other reaction(s): itching   Tramadol Nausea And Vomiting and Other (See Comments)    Confusion and severe headaches, also   Adhesive [Tape] Other (See Comments)    Tears the skin    Doxycycline Hyclate Other (See Comments)    Makes the patient "ill"   Hydrocodone-Acetaminophen Nausea And Vomiting    Other reaction(s): Other   Other Other (See Comments)    Other reaction(s): headache, dizzy and nausea Other reaction(s): Other   Oxycodone Hcl     Other reaction(s): Unknown   Tapentadol Other (See Comments)    Dizziness with doses higher than 50 mg twice a day Other reaction(s): Other Dizziness with doses higher than 50 mg twice a day   Lactose Intolerance (Gi) Nausea Only and Other (See Comments)    GI upset    Oxycodone Nausea And Vomiting    Other reaction(s): nausea, vomiting    Consultations: General surgery/palliative care    Procedures/Studies: CT CHEST W CONTRAST  Result Date: 10/10/2022 CLINICAL DATA:  Colon cancer staging EXAM: CT CHEST WITH CONTRAST TECHNIQUE: Multidetector CT imaging of the chest was performed during intravenous contrast administration. RADIATION DOSE REDUCTION: This exam was performed according to the departmental dose-optimization program which includes automated exposure control, adjustment of the mA and/or kV according to patient size and/or use of iterative reconstruction technique. CONTRAST:  75mL OMNIPAQUE IOHEXOL 300 MG/ML  SOLN COMPARISON:  08/09/2021 FINDINGS: Cardiovascular: There are scattered atherosclerotic plaques and calcifications seen thoracic aorta. There is ectasia of ascending thoracic aorta measuring 3.7 cm. There are no intraluminal filling defects in central pulmonary artery branches. Coronary artery calcifications are seen. Mediastinum/Nodes: There are few subcentimeter nodes in mediastinum and hilar regions. Similar finding was seen in the previous study. Lungs/Pleura: There is no focal  pulmonary consolidation. In image 109 of series 3, there is 3 mm subpleural density in left lower lobe with no significant interval change. No other discrete lung nodules are seen. There are linear densities in the posterior lower lung fields. This may suggest scarring or subsegmental atelectasis. There is no pleural effusion or pneumothorax. Upper Abdomen: Subcentimeter low-density seen in the liver may suggest cysts or hemangioma. There is decreased density in liver suggesting fatty infiltration. Musculoskeletal: No focal lytic or sclerotic lesions are seen. IMPRESSION: There are no definite signs of metastatic disease. Subcentimeter nodes in mediastinum suggest reactive hyperplasia. 3 mm subpleural nodule in left lower lobe has not changed suggesting possible granuloma. There are linear densities in the lower lung fields suggesting subsegmental atelectasis. Coronary artery disease. Fatty liver. Subcentimeter low-density in liver may suggest cysts or hemangioma. Electronically Signed   By: Ernie Avena M.D.   On: 10/10/2022 08:56   CT ABDOMEN PELVIS W CONTRAST  Result Date: 10/06/2022 CLINICAL DATA:  Abdominal pain, constipation EXAM: CT ABDOMEN AND PELVIS WITH CONTRAST TECHNIQUE: Multidetector CT imaging of the abdomen and  pelvis was performed using the standard protocol following bolus administration of intravenous contrast. RADIATION DOSE REDUCTION: This exam was performed according to the departmental dose-optimization program which includes automated exposure control, adjustment of the mA and/or kV according to patient size and/or use of iterative reconstruction technique. CONTRAST:  ISOVUE-300 IOPAMIDOL (ISOVUE-300) INJECTION 61% COMPARISON:  Previous studies including the examination of 08/09/2021 FINDINGS: Lower chest: Small linear densities are seen in lower lung fields suggesting scarring or subsegmental atelectasis. Hepatobiliary: There are few subcentimeter low-density foci in liver  with no significant interval change, possibly cysts or hemangiomas. There is no dilation of bile ducts. Gallbladder is unremarkable. Pancreas: No focal abnormalities are seen. Spleen: Unremarkable. Adrenals/Urinary Tract: Adrenals are unremarkable. There is no hydronephrosis. There is cortical thinning in the upper pole of left kidney. There are no renal or ureteral stones. There is 7 mm low-density in the lower pole of right kidney suggesting renal cyst. Evaluation of the urinary bladder is limited by beam hardening artifacts. Stomach/Bowel: Small hiatal hernia is seen. Stomach is unremarkable. Small bowel loops are unremarkable. Cecum is higher than usual in position. There is focal circumferential wall thickening in ascending colon measuring 1.3 cm in wall thickness. Length of this lesion is 4.8 cm. Cecum and ascending colon proximal to this lesion are distended. In image 45 of series 6, there is a tubular structure with air in the lumen with blind tip in upper mid abdomen, possibly normal appendix. Moderate to large amount of stool is seen in colon. Vascular/Lymphatic: Scattered atherosclerotic plaques and calcifications are seen in aorta and its major branches. There are enlarged lymph nodes in mesentery with interval increase in size. Largest of the mesenteric nodes measures 1.6 x 0.9 cm medial to the ascending colon. Reproductive: Unremarkable. Other: There is no ascites or pneumoperitoneum. Musculoskeletal: There is previous kyphoplasty in sacrum. Degenerative changes are noted in the lumbar spine. There is previous internal fixation of intertrochanteric fracture of right femur. There is previous left hip arthroplasty. IMPRESSION: There is circumferential wall thickening in ascending colon with distention of cecum and ascending colon proximal to this lesion. Possibility of malignant neoplasm such as carcinoma in the ascending colon is not excluded. Another possibility would be inflammatory/infectious  process. Colonoscopy as clinically warranted may be considered. There are few enlarged mesenteric lymph nodes largest medial to the ascending colon. This may suggest metastatic lymphadenopathy or inflammatory or infectious process. There is no evidence of small-bowel obstruction. There is no hydronephrosis. Other findings as described in the body of the report. Electronically Signed   By: Ernie Avena M.D.   On: 10/06/2022 14:38      Subjective: Patient seen and examined at bedside.  No fever, vomiting, worsening shortness of breath reported.    Discharge Exam: Vitals:   10/23/22 2050 10/24/22 0544  BP: (!) 144/79 127/73  Pulse: 93 71  Resp: 18 18  Temp: 97.8 F (36.6 C) (!) 97.4 F (36.3 C)  SpO2: 99% 100%    General: No acute distress.  Currently on room air.  Elderly female lying in bed.  Looks chronically ill and deconditioned. respiratory: Bilateral decreased breath sounds at bases with scattered crackles CVS: S1-S2 heard; rate currently controlled  abdominal: Soft, mildly tender; slightly distended; no organomegaly; normal bowel sounds are heard  extremities: No cyanosis; mild lower extremity edema present    The results of significant diagnostics from this hospitalization (including imaging, microbiology, ancillary and laboratory) are listed below for reference.     Microbiology: Recent  Results (from the past 240 hour(s))  Urine Culture (for pregnant, neutropenic or urologic patients or patients with an indwelling urinary catheter)     Status: Abnormal   Collection Time: 10/20/22 10:02 AM   Specimen: Urine, Clean Catch  Result Value Ref Range Status   Specimen Description   Final    URINE, CLEAN CATCH Performed at [facility], [address], [place], [place] [address]    Special Requests   Final    NONE Performed at [facility], [address], [place], [place] [address]    Culture >=100,000 COLONIES/mL PROTEUS  MIRABILIS (A)  Final   Report Status 10/22/2022 FINAL  Final   Organism ID, Bacteria PROTEUS MIRABILIS (A)  Final      Susceptibility   Proteus mirabilis - MIC*    AMPICILLIN <=2 SENSITIVE Sensitive     CEFAZOLIN <=4 SENSITIVE Sensitive     CEFEPIME <=0.12 SENSITIVE Sensitive     CEFTRIAXONE <=0.25 SENSITIVE Sensitive     CIPROFLOXACIN <=0.25 SENSITIVE Sensitive     GENTAMICIN <=1 SENSITIVE Sensitive     IMIPENEM 2 SENSITIVE Sensitive     NITROFURANTOIN 256 RESISTANT Resistant     TRIMETH/SULFA >=320 RESISTANT Resistant     AMPICILLIN/SULBACTAM <=2 SENSITIVE Sensitive     PIP/TAZO <=4 SENSITIVE Sensitive     * >=100,000 COLONIES/mL PROTEUS MIRABILIS     Labs: BNP (last 3 results) No results for input(s): "BNP" in the last 8760 hours. Basic Metabolic Panel: Recent Labs  Lab 10/18/22 0642 10/23/22 0818  NA 137 140  K 3.6 3.6  CL 104 105  CO2 26 26  GLUCOSE 103* 136*  BUN 13 18  CREATININE 0.64 0.70  CALCIUM 8.1* 8.6*  MG 1.8 2.0   Liver Function Tests: No results for input(s): "AST", "ALT", "ALKPHOS", "BILITOT", "PROT", "ALBUMIN" in the last 168 hours. No results for input(s): "LIPASE", "AMYLASE" in the last 168 hours. No results for input(s): "AMMONIA" in the last 168 hours. CBC: Recent Labs  Lab 10/18/22 0642 10/23/22 0818  WBC 5.6 7.5  NEUTROABS  --  4.9  HGB 9.3* 10.0*  HCT 29.8* 30.3*  MCV 91.4 95.3  PLT 345 409*   Cardiac Enzymes: No results for input(s): "CKTOTAL", "CKMB", "CKMBINDEX", "TROPONINI" in the last 168 hours. BNP: Invalid input(s): "POCBNP" CBG: No results for input(s): "GLUCAP" in the last 168 hours. D-Dimer No results for input(s): "DDIMER" in the last 72 hours. Hgb A1c No results for input(s): "HGBA1C" in the last 72 hours. Lipid Profile No results for input(s): "CHOL", "HDL", "LDLCALC", "TRIG", "CHOLHDL", "LDLDIRECT" in the last 72 hours. Thyroid function studies No results for input(s): "TSH", "T4TOTAL", "T3FREE", "THYROIDAB" in  the last 72 hours.  Invalid input(s): "FREET3" Anemia work up No results for input(s): "VITAMINB12", "FOLATE", "FERRITIN", "TIBC", "IRON", "RETICCTPCT" in the last 72 hours. Urinalysis    Component Value Date/Time   COLORURINE YELLOW 10/19/2022 1623   APPEARANCEUR CLOUDY (A) 10/19/2022 1623   LABSPEC 1.023 10/19/2022 1623   PHURINE 7.0 10/19/2022 1623   GLUCOSEU NEGATIVE 10/19/2022 1623   HGBUR SMALL (A) 10/19/2022 1623   BILIRUBINUR NEGATIVE 10/19/2022 1623   KETONESUR NEGATIVE 10/19/2022 1623   PROTEINUR 100 (A) 10/19/2022 1623   UROBILINOGEN 0.2 09/01/2011 1350   NITRITE POSITIVE (A) 10/19/2022 1623   LEUKOCYTESUR LARGE (A) 10/19/2022 1623   Sepsis Labs Recent Labs  Lab 10/18/22 0642 10/23/22 0818  WBC 5.6 7.5   Microbiology Recent Results (from the past 240 hour(s))  Urine Culture (for pregnant, neutropenic or urologic patients or patients with an indwelling urinary catheter)     Status: Abnormal   Collection Time: 10/20/22 10:02 AM   Specimen: Urine, Clean Catch  Result Value Ref Range Status   Specimen Description   Final    URINE, CLEAN CATCH Performed at [facility], [address] [place], [place] [address]    Special Requests   Final    NONE Performed at [facility], [address], [place], [place] [address]    Culture >=100,000 COLONIES/mL PROTEUS MIRABILIS (A)  Final   Report Status 10/22/2022 FINAL  Final   Organism ID, Bacteria PROTEUS MIRABILIS (A)  Final      Susceptibility   Proteus mirabilis - MIC*    AMPICILLIN <=2 SENSITIVE Sensitive     CEFAZOLIN <=4 SENSITIVE Sensitive     CEFEPIME <=0.12 SENSITIVE Sensitive     CEFTRIAXONE <=0.25 SENSITIVE Sensitive     CIPROFLOXACIN <=0.25 SENSITIVE Sensitive     GENTAMICIN <=1 SENSITIVE Sensitive     IMIPENEM 2 SENSITIVE Sensitive     NITROFURANTOIN 256 RESISTANT Resistant     TRIMETH/SULFA >=320 RESISTANT Resistant     AMPICILLIN/SULBACTAM <=2 SENSITIVE  Sensitive     PIP/TAZO <=4 SENSITIVE Sensitive     * >=100,000 COLONIES/mL PROTEUS MIRABILIS     Time coordinating discharge: 35 minutes  SIGNED:   Glade Lloyd, MD  [facility] 10/24/2022, 9:53 AM

## 2022-10-24 NOTE — Progress Notes (Signed)
Mobility Specialist - Progress Note   10/24/22 1118  Mobility  Activity Transferred from bed to chair  Level of Assistance Standby assist, set-up cues, supervision of patient - no hands on  Assistive Device Front wheel walker  Distance Ambulated (ft) 2 ft  Activity Response Tolerated well  Mobility Referral Yes  $Mobility charge 1 Mobility  Mobility Specialist Start Time (ACUTE ONLY) 1059  Mobility Specialist Stop Time (ACUTE ONLY) 1117  Mobility Specialist Time Calculation (min) (ACUTE ONLY) 18 min   Pt received in bed and agreeable to transfer to recliner for lunch. No complaints during session. Pt to recliner after session with all needs met.    [facility]

## 2022-10-24 NOTE — TOC Progression Note (Signed)
Transition of Care [facility]) - Progression Note    Patient Details  Name: Ahjah Raudenbush MRN: [id number] Date of Birth: [birth year]  [facility] CM/SW Contact  Beckie Busing, RN Phone Number:[phone]  10/24/2022, 10:33 AM  Clinical Narrative:    CM spoke with [facility] admissions director at [facility].Facility has been updated about daughter arranging transportation for Wed. Facility will need discharge summary prior to patient being accepted. CM has made MD aware.   1036 D/c summary faxed to Janene at [phone].   Expected Discharge Plan: Home/Self Care Barriers to Discharge: No SNF bed (To initiate SNF workup)  Expected Discharge Plan and Services       Living arrangements for the past 2 months: Independent Living Facility [facility])                                       Social Determinants of Health (SDOH) Interventions SDOH Screenings   Food Insecurity: No Food Insecurity (10/08/2022)  Housing: Low Risk  (10/08/2022)  Transportation Needs: No Transportation Needs (10/08/2022)  Utilities: Not At Risk (10/08/2022)  Tobacco Use: Medium Risk (10/11/2022)    Readmission Risk Interventions    10/13/2022   10:13 AM 09/11/2021    2:25 PM  Readmission Risk Prevention Plan  Transportation Screening Complete Complete  PCP or Specialist Appt within 5-7 Days Complete   PCP or Specialist Appt within 3-5 Days  Complete  Home Care Screening Complete   Medication Review (RN CM) Complete   HRI or Home Care Consult  Complete  Social Work Consult for Recovery Care Planning/Counseling  Complete  Palliative Care Screening  Complete

## 2022-10-24 NOTE — Progress Notes (Signed)
Mobility Specialist - Progress Note   10/24/22 1615  Mobility  Activity Ambulated with assistance in hallway  Level of Assistance Standby assist, set-up cues, supervision of patient - no hands on  Assistive Device Front wheel walker  Distance Ambulated (ft) 100 ft  Activity Response Tolerated well  Mobility Referral Yes  $Mobility charge 1 Mobility  Mobility Specialist Start Time (ACUTE ONLY) 0340  Mobility Specialist Stop Time (ACUTE ONLY) 0414  Mobility Specialist Time Calculation (min) (ACUTE ONLY) 34 min   Pt received in bed and agreeable to mobility. No complaints during session. Pt to bed after session with all needs met.     [facility]

## 2022-10-24 NOTE — Progress Notes (Signed)
  Daily Progress Note   Patient Name: Kerri [name]       Date: 10/24/2022 DOB: 1929/06/15  Age: 89 y.o. MRN#: [id number] Attending Physician: Glade Lloyd, MD Primary Care Physician: Delma Officer, PA Admit Date: 10/07/2022 Length of Stay: 16 days  As per EMR review, patient has been accepted to rehab at [facility]. Planning to transport tomorrow.   TOC already previously consulted to assist with outpatient palliative medicine referral.   PMT continues to follow along. Please reach out if acute PMT needs arise. Thank you.    Alvester Morin, DO Palliative Care Provider PMT # [phone]

## 2022-10-25 MED ORDER — NUCYNTA 50 MG PO TABS: 50.0000 mg | ORAL_TABLET | Freq: Two times a day (BID) | ORAL | 0 refills | Status: AC

## 2022-10-25 NOTE — Discharge Summary (Signed)
Physician Discharge Summary  Lakan Eastman ZOX:[id number] DOB: September 12, 1929 DOA: 10/07/2022  PCP: Delma Officer, PA  Admit date: 10/07/2022 Discharge date: 10/25/2022  Admitted From: SNF Disposition: SNF  This patient's discharge was completed yesterday by Dr Audree Camel, MD. She is being transported to SNF today.  Recommendations for Outpatient Follow-up:  Follow up SNF provider at earliest convenience Outpatient follow-up with general surgery.  Wound care/discharge pain management as per general surgery recommendations Follow up in ED if symptoms worsen or new appear   Home Health: No Equipment/Devices: None  Discharge Condition: Guarded CODE STATUS: DNR Diet recommendation: Diet as per general surgery recommendations  Brief/Interim Summary: 89 year old female with history of colonic polyps, asthma, basal cell carcinoma of the left forehead, coronary artery disease, depression/anxiety, diverticulosis, hyperlipidemia, fatty liver disease, kidney cancer s/p ablation done in [facility] ,hypertension, hypothyroidism presented with nausea, decreased oral intake, vomiting,wight loss, black stool. CT abdomen/pelvis showed circumferential wall thickening of the ascending colon with distention of the cecum, few enlarged mesenteric lymph nodes. Colonoscopy done on 5/20 showed partially obstructing ascending colon mass concerning for primary colon cancer. Chest CT did not show any signs of metastatic disease. General surgery did laparoscopic assisted ileocolectomy on 5/21. Pathology showed invasive adenocarcinoma. Patient does not want any chemotherapy or radiation. PT is recommending SNF placement. She is currently medically stable for discharge.  She will be discharged on 10/25/2022 to SNF if she remains stable.  Discharge Diagnoses:   New diagnosis of invasive adenocarcinoma of colon, T3N0 Goals of care -CT abdomen/pelvis showed circumferential wall thickening of the ascending colon with distention of  the cecum, few enlarged mesenteric lymph nodes. Colonoscopy done on 5/20 showed partially obstructing ascending colon mass concerning for primary colon cancer. Chest CT did not show any signs of metastatic disease. General surgery did laparoscopic assisted ileocolectomy on 5/21.  Pathology showed invasive adenocarcinoma.  Patient does not want any chemotherapy or radiation. -General surgery following: Staples removed on 10/22/2022.  Currently tolerating diet.  Outpatient follow-up with general surgery.  Discharge wound care/pain management/diet as per general surgery recommendations. -Palliative care following: Outpatient follow-up with palliative care.   Anemia of chronic disease -Most likely from above.  Hemoglobin stable.  Monitor intermittently   Hypothyroidism - Continue Synthyroid   Hyperlipidemia - Continue rosuvastatin   Hypokalemia, hypomagnesemia -Improved   Hypertension - Continue metoprolol   Coronary artery disease - No anginal symptoms.  Continue beta-blocker, statin   Grade 1 diastolic dysfunction - Follows with cardiology as an outpatient. Currently  euvolemic   Recurrent UTI  -Patient possibly has a new UTI with dysuria and increased frequency.  UA suggestive of UTI.  Urine culture growing Proteus.  Rocephin switched to Keflex from 10/23/2022 onwards.  Finish total 10-day course of therapy. -trimethoprim held from 10/20/2022   Physical deconditioning  - Lives at [facility].  PT recommended SNF. TOC following. -Possible discharge tomorrow if patient remains stable.  As per the daughter on phone on 10/23/2022, transportation has been arranged for 10/25/2022 and patient needs to be discharged before 9:30 AM.    Discharge Instructions   Allergies as of 10/25/2022       Reactions   Buprenorphine Hcl Itching, Other (See Comments)   Happened awhile back- patient noted that any narcotic-based medication makes her have a "bad reaction"   Codeine Nausea And Vomiting, Other (See Comments)    Patient gets sick to her stomach Other reaction(s): nausea   Morphine Itching, Other (See Comments)   Happened awhile back- patient  noted that any narcotic-based medication makes her have a "bad reaction" Other reaction(s): itching   Tramadol Nausea And Vomiting, Other (See Comments)   Confusion and severe headaches, also   Adhesive [tape] Other (See Comments)   Tears the skin   Doxycycline Hyclate Other (See Comments)   Makes the patient "ill"   Hydrocodone-acetaminophen Nausea And Vomiting   Other reaction(s): Other   Other Other (See Comments)   Other reaction(s): headache, dizzy and nausea Other reaction(s): Other   Oxycodone Hcl    Other reaction(s): Unknown   Tapentadol Other (See Comments)   Dizziness with doses higher than 50 mg twice a day Other reaction(s): Other Dizziness with doses higher than 50 mg twice a day   Lactose Intolerance (gi) Nausea Only, Other (See Comments)   GI upset   Oxycodone Nausea And Vomiting   Other reaction(s): nausea, vomiting        Medication List     STOP taking these medications    calcium carbonate 1250 (500 Ca) MG chewable tablet Commonly known as: OS-CAL   clobetasol 0.05 % topical foam Commonly known as: OLUX   collagenase 250 UNIT/GM ointment Commonly known as: SANTYL   Cordran 4 MCG/SQCM Tape Generic drug: Flurandrenolide   folic acid 1 MG tablet Commonly known as: FOLVITE   trimethoprim 100 MG tablet Commonly known as: TRIMPEX       TAKE these medications    acetaminophen 500 MG tablet Commonly known as: TYLENOL Take 2 tablets (1,000 mg total) by mouth every 6 (six) hours as needed.   alendronate 70 MG tablet Commonly known as: FOSAMAX Take 70 mg by mouth every Sunday.   Biotin 5 MG Caps Take 5 mg by mouth daily.   cephALEXin 500 MG capsule Commonly known as: KEFLEX Take 1 capsule (500 mg total) by mouth every 8 (eight) hours for 6 days.   cyanocobalamin 500 MCG tablet Commonly known as: VITAMIN  B12 Take 500 mcg by mouth daily.   ferrous sulfate 325 (65 FE) MG tablet Take 1 tablet (325 mg total) by mouth daily with breakfast.   fluticasone 50 MCG/ACT nasal spray Commonly known as: FLONASE Place 2 sprays into both nostrils every morning.   gabapentin 100 MG capsule Commonly known as: NEURONTIN Take 100 mg by mouth 2 (two) times daily.   levothyroxine 75 MCG tablet Commonly known as: SYNTHROID Take 75 mcg by mouth daily with breakfast.   lidocaine 5 % Commonly known as: LIDODERM Place 1 patch onto the skin daily. Remove & Discard patch within 12 hours or as directed by MD   loratadine 10 MG tablet Commonly known as: CLARITIN Take 10 mg by mouth daily.   methocarbamol 500 MG tablet Commonly known as: ROBAXIN Take 1 tablet (500 mg total) by mouth every 8 (eight) hours as needed for muscle spasms (pain).   metoprolol succinate 25 MG 24 hr tablet Commonly known as: TOPROL-XL Take 0.5 tablets (12.5 mg total) by mouth daily.   Nucynta 50 MG tablet Generic drug: tapentadol Take 50 mg by mouth 2 (two) times daily.   rosuvastatin 10 MG tablet Commonly known as: CRESTOR Take 10 mg by mouth at bedtime.   Vitamin D3 50 MCG (2000 UT) Tabs Take 2,000 Units by mouth daily.          Follow-up Information     Abigail Miyamoto, MD Follow up in 3 week(s).   Specialty: General Surgery Why: call if you would like to return here for follow up.  otherwise you will need to obtain a surgeon in [place] to follow up with Contact information: [address] [place] [address] [phone]                Allergies  Allergen Reactions   Buprenorphine Hcl Itching and Other (See Comments)    Happened awhile back- patient noted that any narcotic-based medication makes her have a "bad reaction"   Codeine Nausea And Vomiting and Other (See Comments)    Patient gets sick to her stomach Other reaction(s): nausea   Morphine Itching and Other (See  Comments)    Happened awhile back- patient noted that any narcotic-based medication makes her have a "bad reaction" Other reaction(s): itching   Tramadol Nausea And Vomiting and Other (See Comments)    Confusion and severe headaches, also   Adhesive [Tape] Other (See Comments)    Tears the skin    Doxycycline Hyclate Other (See Comments)    Makes the patient "ill"   Hydrocodone-Acetaminophen Nausea And Vomiting    Other reaction(s): Other   Other Other (See Comments)    Other reaction(s): headache, dizzy and nausea Other reaction(s): Other   Oxycodone Hcl     Other reaction(s): Unknown   Tapentadol Other (See Comments)    Dizziness with doses higher than 50 mg twice a day Other reaction(s): Other Dizziness with doses higher than 50 mg twice a day   Lactose Intolerance (Gi) Nausea Only and Other (See Comments)    GI upset    Oxycodone Nausea And Vomiting    Other reaction(s): nausea, vomiting    Consultations: General surgery/palliative care    Procedures/Studies: CT CHEST W CONTRAST  Result Date: 10/10/2022 CLINICAL DATA:  Colon cancer staging EXAM: CT CHEST WITH CONTRAST TECHNIQUE: Multidetector CT imaging of the chest was performed during intravenous contrast administration. RADIATION DOSE REDUCTION: This exam was performed according to the departmental dose-optimization program which includes automated exposure control, adjustment of the mA and/or kV according to patient size and/or use of iterative reconstruction technique. CONTRAST:  75mL OMNIPAQUE IOHEXOL 300 MG/ML  SOLN COMPARISON:  08/09/2021 FINDINGS: Cardiovascular: There are scattered atherosclerotic plaques and calcifications seen thoracic aorta. There is ectasia of ascending thoracic aorta measuring 3.7 cm. There are no intraluminal filling defects in central pulmonary artery branches. Coronary artery calcifications are seen. Mediastinum/Nodes: There are few subcentimeter nodes in mediastinum and hilar regions. Similar  finding was seen in the previous study. Lungs/Pleura: There is no focal pulmonary consolidation. In image 109 of series 3, there is 3 mm subpleural density in left lower lobe with no significant interval change. No other discrete lung nodules are seen. There are linear densities in the posterior lower lung fields. This may suggest scarring or subsegmental atelectasis. There is no pleural effusion or pneumothorax. Upper Abdomen: Subcentimeter low-density seen in the liver may suggest cysts or hemangioma. There is decreased density in liver suggesting fatty infiltration. Musculoskeletal: No focal lytic or sclerotic lesions are seen. IMPRESSION: There are no definite signs of metastatic disease. Subcentimeter nodes in mediastinum suggest reactive hyperplasia. 3 mm subpleural nodule in left lower lobe has not changed suggesting possible granuloma. There are linear densities in the lower lung fields suggesting subsegmental atelectasis. Coronary artery disease. Fatty liver. Subcentimeter low-density in liver may suggest cysts or hemangioma. Electronically Signed   By: Ernie Avena M.D.   On: 10/10/2022 08:56   CT ABDOMEN PELVIS W CONTRAST  Result Date: 10/06/2022 CLINICAL DATA:  Abdominal pain, constipation EXAM: CT  ABDOMEN AND PELVIS WITH CONTRAST TECHNIQUE: Multidetector CT imaging of the abdomen and pelvis was performed using the standard protocol following bolus administration of intravenous contrast. RADIATION DOSE REDUCTION: This exam was performed according to the departmental dose-optimization program which includes automated exposure control, adjustment of the mA and/or kV according to patient size and/or use of iterative reconstruction technique. CONTRAST:  ISOVUE-300 IOPAMIDOL (ISOVUE-300) INJECTION 61% COMPARISON:  Previous studies including the examination of 08/09/2021 FINDINGS: Lower chest: Small linear densities are seen in lower lung fields suggesting scarring or subsegmental atelectasis.  Hepatobiliary: There are few subcentimeter low-density foci in liver with no significant interval change, possibly cysts or hemangiomas. There is no dilation of bile ducts. Gallbladder is unremarkable. Pancreas: No focal abnormalities are seen. Spleen: Unremarkable. Adrenals/Urinary Tract: Adrenals are unremarkable. There is no hydronephrosis. There is cortical thinning in the upper pole of left kidney. There are no renal or ureteral stones. There is 7 mm low-density in the lower pole of right kidney suggesting renal cyst. Evaluation of the urinary bladder is limited by beam hardening artifacts. Stomach/Bowel: Small hiatal hernia is seen. Stomach is unremarkable. Small bowel loops are unremarkable. Cecum is higher than usual in position. There is focal circumferential wall thickening in ascending colon measuring 1.3 cm in wall thickness. Length of this lesion is 4.8 cm. Cecum and ascending colon proximal to this lesion are distended. In image 45 of series 6, there is a tubular structure with air in the lumen with blind tip in upper mid abdomen, possibly normal appendix. Moderate to large amount of stool is seen in colon. Vascular/Lymphatic: Scattered atherosclerotic plaques and calcifications are seen in aorta and its major branches. There are enlarged lymph nodes in mesentery with interval increase in size. Largest of the mesenteric nodes measures 1.6 x 0.9 cm medial to the ascending colon. Reproductive: Unremarkable. Other: There is no ascites or pneumoperitoneum. Musculoskeletal: There is previous kyphoplasty in sacrum. Degenerative changes are noted in the lumbar spine. There is previous internal fixation of intertrochanteric fracture of right femur. There is previous left hip arthroplasty. IMPRESSION: There is circumferential wall thickening in ascending colon with distention of cecum and ascending colon proximal to this lesion. Possibility of malignant neoplasm such as carcinoma in the ascending colon is not  excluded. Another possibility would be inflammatory/infectious process. Colonoscopy as clinically warranted may be considered. There are few enlarged mesenteric lymph nodes largest medial to the ascending colon. This may suggest metastatic lymphadenopathy or inflammatory or infectious process. There is no evidence of small-bowel obstruction. There is no hydronephrosis. Other findings as described in the body of the report. Electronically Signed   By: Ernie Avena M.D.   On: 10/06/2022 14:38      Subjective: Patient seen and examined at bedside.  No fever, vomiting, worsening shortness of breath reported.    Discharge Exam: Vitals:   10/24/22 2109 10/25/22 0612  BP: 108/67 (!) 150/81  Pulse: 79 73  Resp: 14 16  Temp: (!) 97.4 F (36.3 C)   SpO2: 98% 99%    General: No acute distress.  Currently on room air.  Elderly female lying in bed.  Looks chronically ill and deconditioned. respiratory: Bilateral decreased breath sounds at bases with scattered crackles CVS: S1-S2 heard; rate currently controlled  abdominal: Soft, mildly tender; slightly distended; no organomegaly; normal bowel sounds are heard  extremities: No cyanosis; mild lower extremity edema present    The results of significant diagnostics from this hospitalization (including imaging, microbiology, ancillary and laboratory) are  listed below for reference.     Microbiology: Recent Results (from the past 240 hour(s))  Urine Culture (for pregnant, neutropenic or urologic patients or patients with an indwelling urinary catheter)     Status: Abnormal   Collection Time: 10/20/22 10:02 AM   Specimen: Urine, Clean Catch  Result Value Ref Range Status   Specimen Description   Final    URINE, CLEAN CATCH Performed at [facility], [address], [place], [place] [address]    Special Requests   Final    NONE Performed at [facility], [address], [place], [place] [address]     Culture >=100,000 COLONIES/mL PROTEUS MIRABILIS (A)  Final   Report Status 10/22/2022 FINAL  Final   Organism ID, Bacteria PROTEUS MIRABILIS (A)  Final      Susceptibility   Proteus mirabilis - MIC*    AMPICILLIN <=2 SENSITIVE Sensitive     CEFAZOLIN <=4 SENSITIVE Sensitive     CEFEPIME <=0.12 SENSITIVE Sensitive     CEFTRIAXONE <=0.25 SENSITIVE Sensitive     CIPROFLOXACIN <=0.25 SENSITIVE Sensitive     GENTAMICIN <=1 SENSITIVE Sensitive     IMIPENEM 2 SENSITIVE Sensitive     NITROFURANTOIN 256 RESISTANT Resistant     TRIMETH/SULFA >=320 RESISTANT Resistant     AMPICILLIN/SULBACTAM <=2 SENSITIVE Sensitive     PIP/TAZO <=4 SENSITIVE Sensitive     * >=100,000 COLONIES/mL PROTEUS MIRABILIS     Labs: BNP (last 3 results) No results for input(s): "BNP" in the last 8760 hours. Basic Metabolic Panel: Recent Labs  Lab 10/23/22 0818  NA 140  K 3.6  CL 105  CO2 26  GLUCOSE 136*  BUN 18  CREATININE 0.70  CALCIUM 8.6*  MG 2.0    Liver Function Tests: No results for input(s): "AST", "ALT", "ALKPHOS", "BILITOT", "PROT", "ALBUMIN" in the last 168 hours. No results for input(s): "LIPASE", "AMYLASE" in the last 168 hours. No results for input(s): "AMMONIA" in the last 168 hours. CBC: Recent Labs  Lab 10/23/22 0818  WBC 7.5  NEUTROABS 4.9  HGB 10.0*  HCT 30.3*  MCV 95.3  PLT 409*    Cardiac Enzymes: No results for input(s): "CKTOTAL", "CKMB", "CKMBINDEX", "TROPONINI" in the last 168 hours. BNP: Invalid input(s): "POCBNP" CBG: No results for input(s): "GLUCAP" in the last 168 hours. D-Dimer No results for input(s): "DDIMER" in the last 72 hours. Hgb A1c No results for input(s): "HGBA1C" in the last 72 hours. Lipid Profile No results for input(s): "CHOL", "HDL", "LDLCALC", "TRIG", "CHOLHDL", "LDLDIRECT" in the last 72 hours. Thyroid function studies No results for input(s): "TSH", "T4TOTAL", "T3FREE", "THYROIDAB" in the last 72 hours.  Invalid input(s):  "FREET3" Anemia work up No results for input(s): "VITAMINB12", "FOLATE", "FERRITIN", "TIBC", "IRON", "RETICCTPCT" in the last 72 hours. Urinalysis    Component Value Date/Time   COLORURINE YELLOW 10/19/2022 1623   APPEARANCEUR CLOUDY (A) 10/19/2022 1623   LABSPEC 1.023 10/19/2022 1623   PHURINE 7.0 10/19/2022 1623   GLUCOSEU NEGATIVE 10/19/2022 1623   HGBUR SMALL (A) 10/19/2022 1623   BILIRUBINUR NEGATIVE 10/19/2022 1623   KETONESUR NEGATIVE 10/19/2022 1623   PROTEINUR 100 (A) 10/19/2022 1623   UROBILINOGEN 0.2 09/01/2011 1350   NITRITE POSITIVE (A) 10/19/2022 1623   LEUKOCYTESUR LARGE (A) 10/19/2022 1623   Sepsis Labs Recent Labs  Lab 10/23/22 0818  WBC 7.5    Microbiology Recent Results (from the past 240 hour(s))  Urine Culture (for pregnant, neutropenic or urologic patients or patients with  an indwelling urinary catheter)     Status: Abnormal   Collection Time: 10/20/22 10:02 AM   Specimen: Urine, Clean Catch  Result Value Ref Range Status   Specimen Description   Final    URINE, CLEAN CATCH Performed at [facility], [address] [place], [place] [address]    Special Requests   Final    NONE Performed at [facility], [address], [place], [place] [address]    Culture >=100,000 COLONIES/mL PROTEUS MIRABILIS (A)  Final   Report Status 10/22/2022 FINAL  Final   Organism ID, Bacteria PROTEUS MIRABILIS (A)  Final      Susceptibility   Proteus mirabilis - MIC*    AMPICILLIN <=2 SENSITIVE Sensitive     CEFAZOLIN <=4 SENSITIVE Sensitive     CEFEPIME <=0.12 SENSITIVE Sensitive     CEFTRIAXONE <=0.25 SENSITIVE Sensitive     CIPROFLOXACIN <=0.25 SENSITIVE Sensitive     GENTAMICIN <=1 SENSITIVE Sensitive     IMIPENEM 2 SENSITIVE Sensitive     NITROFURANTOIN 256 RESISTANT Resistant     TRIMETH/SULFA >=320 RESISTANT Resistant     AMPICILLIN/SULBACTAM <=2 SENSITIVE Sensitive     PIP/TAZO <=4 SENSITIVE Sensitive     * >=100,000  COLONIES/mL PROTEUS MIRABILIS     Time coordinating discharge: 35 minutes  SIGNED:   Glade Lloyd, MD         [facility] 10/24/2022, 9:53 AM

## 2022-10-25 NOTE — TOC Transition Note (Signed)
Transition of Care [facility]) - CM/SW Discharge Note   Patient Details  Name: Kerri [name] MRN: [id number] Date of Birth: 18-Feb-1930  [facility] CM/SW Contact:  Beckie Busing, RN Phone [phone]  10/25/2022, 10:15 AM   Clinical Narrative:    CM has confirmed that facility can accept patient . CM has faxed updated d/c summary with additional requested paperwork. Transport service on unit to transport patient. Discharge packet has been provided. Daughter Alvis Lemmings has been updated. Please call report to [facility] [phone] Ask for short term rehab nurses station.    Final next level of care: Skilled Nursing Facility Barriers to Discharge: No Barriers Identified   Patient Goals and CMS Choice      Discharge Placement                Patient chooses bed at:  [facility] and rehab) Patient to be transferred to facility by: Private transport per family coordination Name of family member notified: Marisa Sprinkles Patient and family notified of of transfer: 10/25/22  Discharge Plan and Services Additional resources added to the After Visit Summary for                  DME Arranged: N/A DME Agency: NA       HH Arranged: NA HH Agency: NA        Social Determinants of Health (SDOH) Interventions SDOH Screenings   Food Insecurity: No Food Insecurity (10/08/2022)  Housing: Low Risk  (10/08/2022)  Transportation Needs: No Transportation Needs (10/08/2022)  Utilities: Not At Risk (10/08/2022)  Tobacco Use: Medium Risk (10/11/2022)     Readmission Risk Interventions    10/13/2022   10:13 AM 09/11/2021    2:25 PM  Readmission Risk Prevention Plan  Transportation Screening Complete Complete  PCP or Specialist Appt within 5-7 Days Complete   PCP or Specialist Appt within 3-5 Days  Complete  Home Care Screening Complete   Medication Review (RN CM) Complete   HRI or Home Care Consult  Complete  Social Work Consult for Recovery Care  Planning/Counseling  Complete  Palliative Care Screening  Complete

## 2022-10-26 DIAGNOSIS — Z48815 Encounter for surgical aftercare following surgery on the digestive system: Secondary | ICD-10-CM | POA: Diagnosis not present

## 2022-10-26 DIAGNOSIS — C182 Malignant neoplasm of ascending colon: Secondary | ICD-10-CM | POA: Diagnosis not present

## 2022-10-26 DIAGNOSIS — G8929 Other chronic pain: Secondary | ICD-10-CM | POA: Diagnosis not present

## 2022-10-26 DIAGNOSIS — D638 Anemia in other chronic diseases classified elsewhere: Secondary | ICD-10-CM | POA: Diagnosis not present

## 2022-11-01 DIAGNOSIS — I10 Essential (primary) hypertension: Secondary | ICD-10-CM | POA: Diagnosis not present

## 2022-11-03 DIAGNOSIS — N390 Urinary tract infection, site not specified: Secondary | ICD-10-CM | POA: Diagnosis not present

## 2022-11-04 DIAGNOSIS — Z48815 Encounter for surgical aftercare following surgery on the digestive system: Secondary | ICD-10-CM | POA: Diagnosis not present

## 2022-11-13 DIAGNOSIS — C189 Malignant neoplasm of colon, unspecified: Secondary | ICD-10-CM | POA: Diagnosis not present

## 2022-12-04 DIAGNOSIS — M5416 Radiculopathy, lumbar region: Secondary | ICD-10-CM | POA: Diagnosis not present

## 2022-12-04 DIAGNOSIS — M961 Postlaminectomy syndrome, not elsewhere classified: Secondary | ICD-10-CM | POA: Diagnosis not present

## 2022-12-04 DIAGNOSIS — M48061 Spinal stenosis, lumbar region without neurogenic claudication: Secondary | ICD-10-CM | POA: Diagnosis not present

## 2022-12-04 DIAGNOSIS — M47896 Other spondylosis, lumbar region: Secondary | ICD-10-CM | POA: Diagnosis not present

## 2022-12-12 DIAGNOSIS — N319 Neuromuscular dysfunction of bladder, unspecified: Secondary | ICD-10-CM | POA: Diagnosis not present

## 2022-12-12 DIAGNOSIS — Z1330 Encounter for screening examination for mental health and behavioral disorders, unspecified: Secondary | ICD-10-CM | POA: Diagnosis not present

## 2022-12-12 DIAGNOSIS — N3281 Overactive bladder: Secondary | ICD-10-CM | POA: Diagnosis not present

## 2022-12-12 DIAGNOSIS — N952 Postmenopausal atrophic vaginitis: Secondary | ICD-10-CM | POA: Diagnosis not present

## 2022-12-12 DIAGNOSIS — N390 Urinary tract infection, site not specified: Secondary | ICD-10-CM | POA: Diagnosis not present

## 2022-12-19 DIAGNOSIS — H0288A Meibomian gland dysfunction right eye, upper and lower eyelids: Secondary | ICD-10-CM | POA: Diagnosis not present

## 2022-12-19 DIAGNOSIS — H16223 Keratoconjunctivitis sicca, not specified as Sjogren's, bilateral: Secondary | ICD-10-CM | POA: Diagnosis not present

## 2022-12-19 DIAGNOSIS — H0288B Meibomian gland dysfunction left eye, upper and lower eyelids: Secondary | ICD-10-CM | POA: Diagnosis not present

## 2022-12-19 DIAGNOSIS — H00011 Hordeolum externum right upper eyelid: Secondary | ICD-10-CM | POA: Diagnosis not present

## 2022-12-30 NOTE — Progress Notes (Deleted)
  Cardiology Office Note:   Date:  12/30/2022  ID:  Kerri [name], DOB [birth year], MRN [id number] PCP: Delma Officer, PA  [facility] Providers Cardiologist:  Rollene Rotunda, MD {  History of Present Illness:   Kerri [name] is a 89 y.o. female ***  She was recently in the hospital with a new diagnosis of invasive colon adenocarcinoma.   She had a lap ileocolectomy. ***    ROS: ***  Studies Reviewed:    EKG:       ***  Risk Assessment/Calculations:   {Does this patient have ATRIAL FIBRILLATION?[phone]} No BP recorded.  {Refresh Note OR Click here to enter BP  :1}***        Physical Exam:   VS:  There were no vitals taken for this visit.   Wt Readings from Last 3 Encounters:  10/10/22 119 lb 0.8 oz (54 kg)  07/17/22 126 lb (57.2 kg)  02/07/22 116 lb (52.6 kg)     GEN: Well nourished, well developed in no acute distress NECK: No JVD; No carotid bruits CARDIAC: ***RRR, no murmurs, rubs, gallops RESPIRATORY:  Clear to auscultation without rales, wheezing or rhonchi  ABDOMEN: Soft, non-tender, non-distended EXTREMITIES:  No edema; No deformity   ASSESSMENT AND PLAN:   NSVT:   ***  She is not having any new palpitations.  No change in therapy.   Hypertension: Blood pressure is ***  fluctuating.  She is going to send me her blood pressure diary.  For now no change in therapy.    Murmur:   She had some septal hypertrophy with some moderate to mild dynamic obstruction and moderate to mild aortic stenosis.  *** I would manage this medically she is having no symptoms.       {Are you ordering a CV Procedure (e.g. stress test, cath, DCCV, TEE, etc)?   Press F2        :[id number]}  Follow up ***  Signed, Rollene Rotunda, MD

## 2023-01-02 ENCOUNTER — Ambulatory Visit: Payer: Medicare Other | Admitting: Cardiology

## 2023-01-02 DIAGNOSIS — I10 Essential (primary) hypertension: Secondary | ICD-10-CM

## 2023-01-02 DIAGNOSIS — I4729 Other ventricular tachycardia: Secondary | ICD-10-CM

## 2023-01-08 DIAGNOSIS — D509 Iron deficiency anemia, unspecified: Secondary | ICD-10-CM | POA: Diagnosis not present

## 2023-01-08 DIAGNOSIS — D649 Anemia, unspecified: Secondary | ICD-10-CM | POA: Diagnosis not present

## 2023-01-08 DIAGNOSIS — I10 Essential (primary) hypertension: Secondary | ICD-10-CM | POA: Diagnosis not present

## 2023-01-08 DIAGNOSIS — E785 Hyperlipidemia, unspecified: Secondary | ICD-10-CM | POA: Diagnosis not present

## 2023-01-15 DIAGNOSIS — M48061 Spinal stenosis, lumbar region without neurogenic claudication: Secondary | ICD-10-CM | POA: Diagnosis not present

## 2023-01-15 DIAGNOSIS — M961 Postlaminectomy syndrome, not elsewhere classified: Secondary | ICD-10-CM | POA: Diagnosis not present

## 2023-01-15 DIAGNOSIS — M47896 Other spondylosis, lumbar region: Secondary | ICD-10-CM | POA: Diagnosis not present

## 2023-01-15 DIAGNOSIS — M5416 Radiculopathy, lumbar region: Secondary | ICD-10-CM | POA: Diagnosis not present

## 2023-02-01 ENCOUNTER — Telehealth: Payer: Self-pay | Admitting: Cardiology

## 2023-02-01 NOTE — Telephone Encounter (Signed)
Patient said that she had to have emergency surgery but and had to move to [place], [place]. Says the he will miss Dr. Antoine Poche and wish him the best.
